# Patient Record
Sex: Male | Born: 1950 | Race: White | Hispanic: No | Marital: Married | Smoking: Never smoker
Health system: Southern US, Community
[De-identification: ages and names within clinical notes are randomized; demographics above are authoritative.]

## PROBLEM LIST (undated history)

## (undated) DIAGNOSIS — R269 Unspecified abnormalities of gait and mobility: Secondary | ICD-10-CM

## (undated) DIAGNOSIS — I519 Heart disease, unspecified: Secondary | ICD-10-CM

## (undated) DIAGNOSIS — I252 Old myocardial infarction: Secondary | ICD-10-CM

## (undated) DIAGNOSIS — I609 Nontraumatic subarachnoid hemorrhage, unspecified: Secondary | ICD-10-CM

## (undated) DIAGNOSIS — S065X9A Traumatic subdural hemorrhage with loss of consciousness of unspecified duration, initial encounter: Secondary | ICD-10-CM

## (undated) DIAGNOSIS — F068 Other specified mental disorders due to known physiological condition: Secondary | ICD-10-CM

## (undated) DIAGNOSIS — R32 Unspecified urinary incontinence: Secondary | ICD-10-CM

## (undated) DIAGNOSIS — N433 Hydrocele, unspecified: Secondary | ICD-10-CM

## (undated) DIAGNOSIS — I499 Cardiac arrhythmia, unspecified: Secondary | ICD-10-CM

## (undated) DIAGNOSIS — I480 Paroxysmal atrial fibrillation: Secondary | ICD-10-CM

## (undated) DIAGNOSIS — R413 Other amnesia: Secondary | ICD-10-CM

## (undated) DIAGNOSIS — E785 Hyperlipidemia, unspecified: Secondary | ICD-10-CM

## (undated) DIAGNOSIS — S065XAA Traumatic subdural hemorrhage with loss of consciousness status unknown, initial encounter: Secondary | ICD-10-CM

## (undated) DIAGNOSIS — G40909 Epilepsy, unspecified, not intractable, without status epilepticus: Secondary | ICD-10-CM

## (undated) DIAGNOSIS — S069X0S Unspecified intracranial injury without loss of consciousness, sequela: Secondary | ICD-10-CM

## (undated) DIAGNOSIS — E119 Type 2 diabetes mellitus without complications: Secondary | ICD-10-CM

## (undated) DIAGNOSIS — R399 Unspecified symptoms and signs involving the genitourinary system: Secondary | ICD-10-CM

## (undated) DIAGNOSIS — I10 Essential (primary) hypertension: Secondary | ICD-10-CM

## (undated) DIAGNOSIS — R4189 Other symptoms and signs involving cognitive functions and awareness: Secondary | ICD-10-CM

## (undated) DIAGNOSIS — I2510 Atherosclerotic heart disease of native coronary artery without angina pectoris: Secondary | ICD-10-CM

## (undated) DIAGNOSIS — Z955 Presence of coronary angioplasty implant and graft: Secondary | ICD-10-CM

## (undated) HISTORY — PX: TONSILLECTOMY: SUR1361

## (undated) HISTORY — DX: Heart disease, unspecified: I51.9

## (undated) HISTORY — PX: CATARACT EXTRACTION W/ INTRAOCULAR LENS  IMPLANT, BILATERAL: SHX1307

## (undated) HISTORY — PX: OTHER SURGICAL HISTORY: SHX169

## (undated) HISTORY — DX: Traumatic subdural hemorrhage with loss of consciousness of unspecified duration, initial encounter: S06.5X9A

## (undated) HISTORY — PX: CORONARY ANGIOPLASTY WITH STENT PLACEMENT: SHX49

## (undated) HISTORY — DX: Traumatic subdural hemorrhage with loss of consciousness status unknown, initial encounter: S06.5XAA

---

## 2006-12-02 ENCOUNTER — Encounter (INDEPENDENT_AMBULATORY_CARE_PROVIDER_SITE_OTHER): Payer: Self-pay | Admitting: Ophthalmology

## 2006-12-02 ENCOUNTER — Ambulatory Visit (HOSPITAL_COMMUNITY): Admission: RE | Admit: 2006-12-02 | Discharge: 2006-12-03 | Payer: Self-pay | Admitting: Ophthalmology

## 2006-12-02 IMAGING — CR DG CHEST 2V
2 series · 2 of 2 positions shown · non-contrast
Comparison: None.

CLINICAL DATA: Dislocated intraarticular lens left eye.  Preoperative chest.
 CHEST - 2 VIEW:

[view not recorded (1 of 2)]
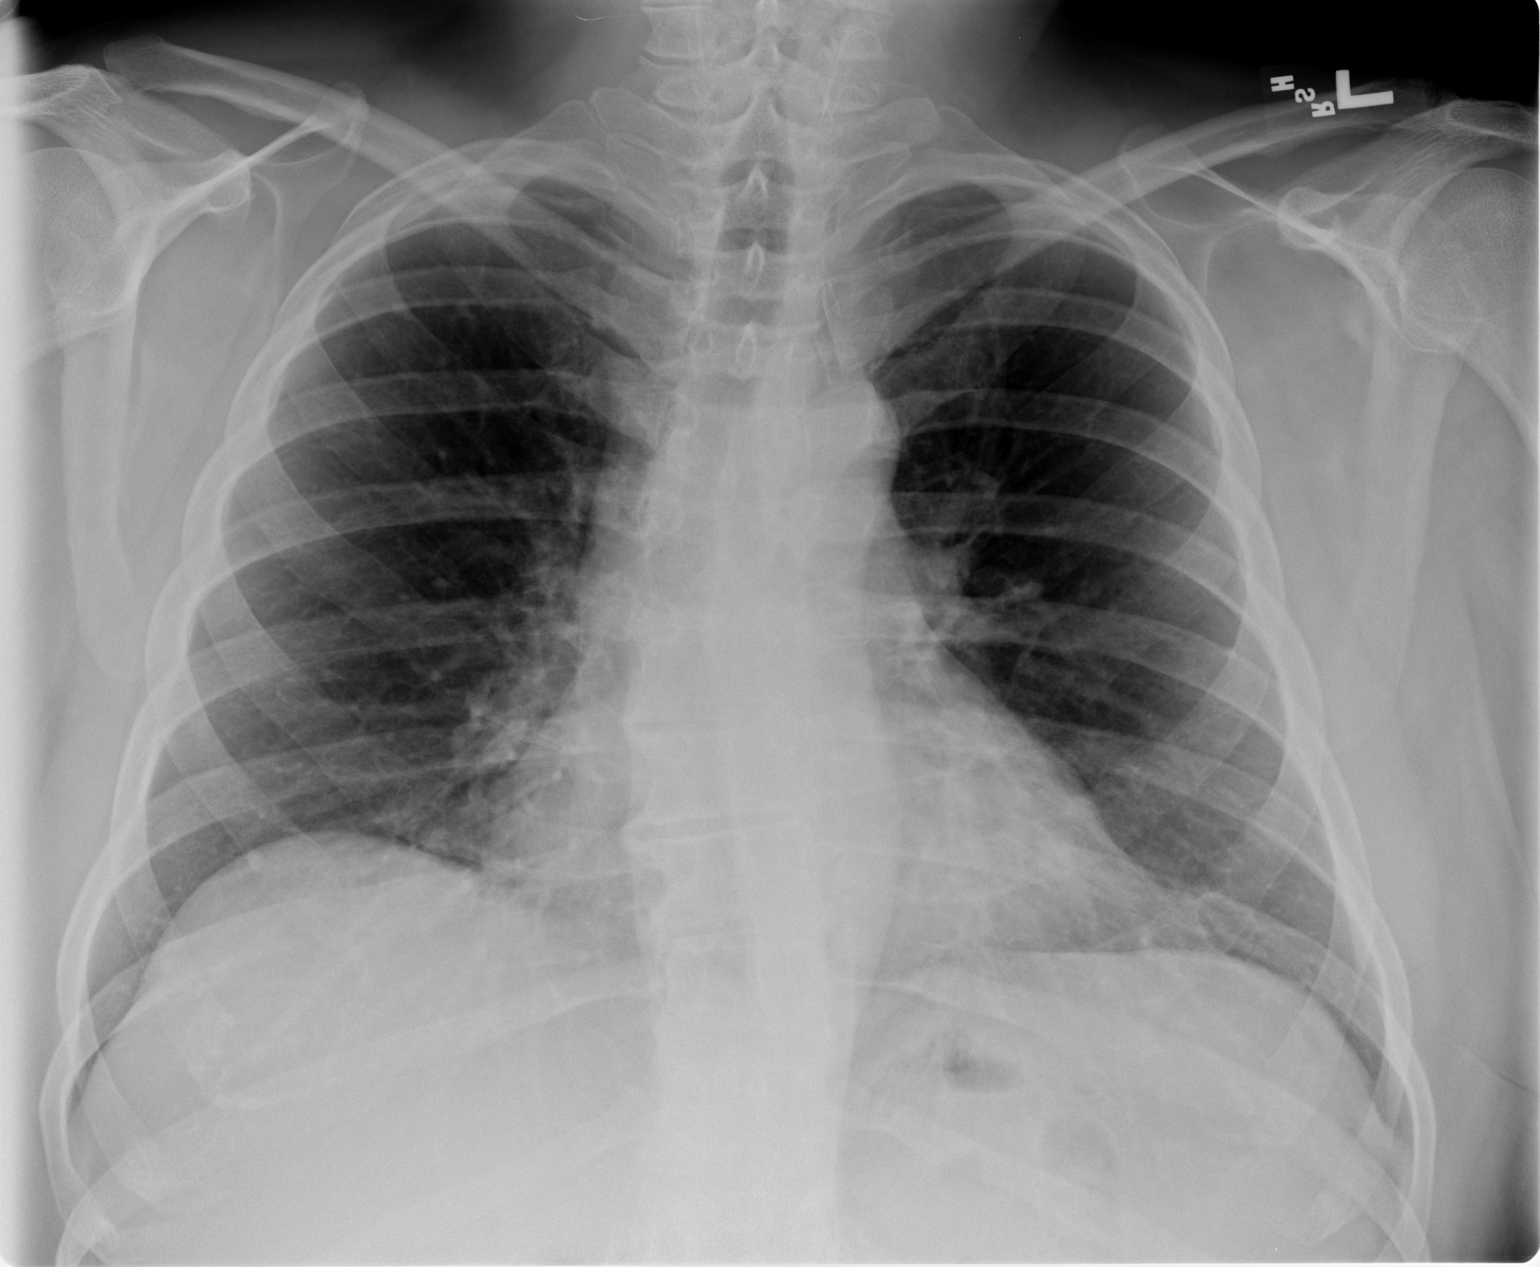

[view not recorded (2 of 2)]
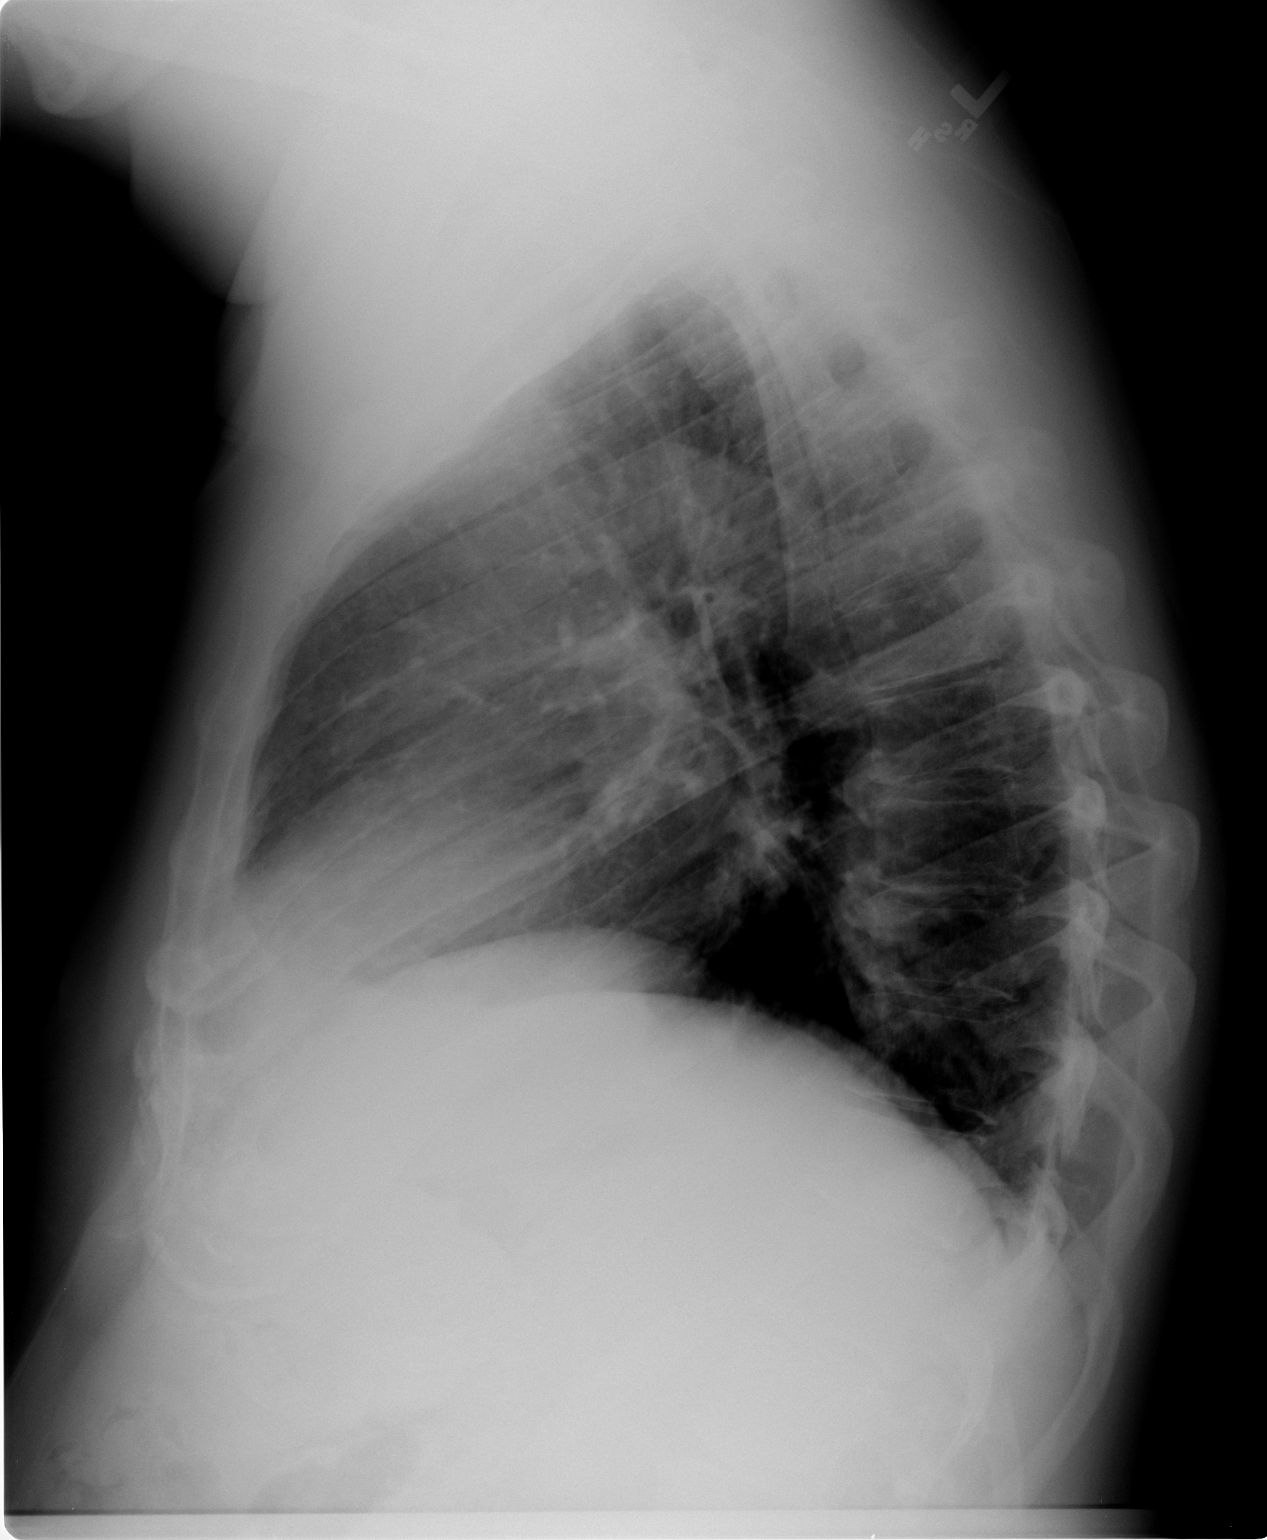

[2 of 2 positions shown; findings below may reference images not displayed]

FINDINGS: The lungs are clear.  The heart is normal in size and there are no mediastinal or hilar abnormalities. There is mild thoracic scoliosis present.
IMPRESSION: No evidence for active chest disease.

## 2008-04-03 ENCOUNTER — Emergency Department (HOSPITAL_BASED_OUTPATIENT_CLINIC_OR_DEPARTMENT_OTHER): Admission: EM | Admit: 2008-04-03 | Discharge: 2008-04-03 | Payer: Self-pay | Admitting: Emergency Medicine

## 2010-07-25 NOTE — Op Note (Signed)
[name] Frank, [name] Frank                 ACCOUNT NO.:  [id number]   MEDICAL RECORD NO.:  [id number]          PATIENT TYPE:  OIB   LOCATION:  5707                         FACILITY:  [facility]   PHYSICIAN:  Lanna Poche, M.D. DATE OF BIRTH:  29-Jan-1951   DATE OF PROCEDURE:  12/02/2006  DATE OF DISCHARGE:  12/03/2006                               OPERATIVE REPORT   PRE-AND-POSTOPERATIVE DIAGNOSIS:  Dislocated intraocular lens left eye.   PROCEDURE:  Pars plana vitrectomy, membrane peel, intraocular lens  removal, secondary sutured intraocular lens left eye.   SURGEON:  Lanna Poche, M.D.   ASSISTANT:  Bryan Lemma. Lundquist, P.A.   ANESTHESIA:  General endotracheal.   ESTIMATED BLOOD LOSS:  Less than 1 mL.   COMPLICATIONS:  None.   OPERATIVE NOTE:  The patient was taken to the operating room, where  after induction of general anesthesia the left eye was prepped and  draped in the usual fashion.  A lid speculum was introduced and  conjunctival peritomy was developed to __________ degrees.  Hemostasis  was obtained with Eraser cautery and sclerotomies were fashioned 3.75 mm  posterior to the limbus at 1:30, 10:30, and 4:30.  The superior  sclerotomies were plugged and a 4-mm infusion cannula was secured at the  4:30 sclerotomy with a temporary suture of 7-0 Vicryl.  The tip was  visually inspected and found to be in good position.  The anterior  chamber was inspected, and the rents in the anterior-and-posterior  capsule could be seen, but there was no implant visible in the visual  axis.  A partial-thickness scleral incision of cord length 7 mm was made  2 mm posterior to the limbus superiorly, the dissection carried up from  the cornea entering the anterior chamber for a full cord length of the  incision.   The suture was temporally placed over the incision to make it  watertight.  Landers ring was then secured with 7-0 Vicryl sutures at 3  and 9.  The plugs were then removed and a  30-degree prismatic lens was  applied to the surface of the eye.  Central core followed by peripheral  vitrectomy was performed.  The dislocated IOL could be seen in the  vitreous cavity overlying the inferior and posterior retina.  After  completion of peripheral vitrectomy, a flat lens was applied to the  surface.  The posterior cortical vitreous was still seen to be present.  This was engaged with a silicone-tip catheter, and then the vitrector  blade was then peeled off the posterior segment.  The peripheral  vitrectomy was then performed around the IOL allowing the IOL to slip  through the opening in posterior hyaloid and then scleral depression  used to further trim the small amount of hemorrhage in the vitreous  space inferiorly with the aid of the 30-degree prismatic lens.  The flat  lens was then substituted on the surface of the eye using the serrated  forceps.  The IOL haptic was grasped and brought up to the pupillary  plane.  The Bernette Mayers lensring  was then removed,  and the suture over the  wound superiorly was opened.   The IOL was then stabilized __________ the second __________ forceps of  the other superior sclerotomy, and then the fusion turned off, the IOL  grasped, and dialed out through the superior wound.  The holes were  plugged and inspection with the indirect ophthalmoscope with scleral  depression at 360-degrees revealed there to be no breaks or tears.  Partial-thickness trapezoidal scleral flaps were formed at 3 and 9.  A  10-0 Prolene sutures was then passed approximately 2 mm posterior to the  limbus under one flap and coming out the other, the lateral guided with  a 27-gauge needle.  The end of the Prolene suture was then externalized  out through the superior wound and tied to both sides of the IOL.  The  IOL was a 10.0 diopter, model CZ-70-BP, serial number [id number].  The  IOL was to be seen free of defects.  Infusion was turned off and the IOL  was  then copiously irrigated and passed with a lens forceps into the  sulcus area.  The superior haptic was then manipulated into the sulcus  area temporally with the aid of a Lester lens hook.   The 10-0 Prolene sutures were then tightened and the IOL was seen to  center well.  Partial-thickness bites were made in the scleral bed at 3  and 9 using the Prolene sutures, an the sutures tied to themselves.  The  superior wound was then closed with interrupted sutures of 10-0 nylon.  The partial-thickness scleral flaps in the superior sclerotomies were  closed with 7-0 Vicryl.  The infusion cannula was removed and preplaced  sutures secured.  The wound was checked for water tightness and found to  be secured.  The conjunctivae was drawn up and reapproximated with  running suture of 6-0 plain gut.  The subconjunctival space was  irrigated with 0.75% Marcaine followed by subconjunctival injection of  100 mg of ceftazidime,  10 mg of Decadron.  Pressure was checked with  the Barraquer tonometer and found to lay at 15.  The lid speculum was  then removed and mixed antibiotic ointment applied to the surface of the  eye.  A patch and shield was then placed over the patient's eye.  Upon  awakening from anesthesia, the patient left the operating room in stable  condition.           ______________________________  Lanna Poche, M.D.     JTH/MEDQ  D:  12/02/2006  T:  12/03/2006  Job:  [id number]

## 2010-12-21 LAB — URINALYSIS, ROUTINE W REFLEX MICROSCOPIC
Glucose, UA: NEGATIVE
Hgb urine dipstick: NEGATIVE
Nitrite: NEGATIVE
Specific Gravity, Urine: 1.027

## 2010-12-21 LAB — CBC
HCT: 44.3
Hemoglobin: 15.5
MCHC: 35
MCV: 86.3
Platelets: 182
RDW: 13

## 2010-12-21 LAB — COMPREHENSIVE METABOLIC PANEL
Albumin: 3.8
Alkaline Phosphatase: 44
BUN: 19
Creatinine, Ser: 0.84
Glucose, Bld: 124 — ABNORMAL HIGH
Potassium: 5.2 — ABNORMAL HIGH
Total Protein: 6

## 2011-01-13 ENCOUNTER — Other Ambulatory Visit: Payer: Self-pay | Admitting: Emergency Medicine

## 2011-01-13 ENCOUNTER — Encounter: Payer: Self-pay | Admitting: Emergency Medicine

## 2011-01-13 ENCOUNTER — Inpatient Hospital Stay (INDEPENDENT_AMBULATORY_CARE_PROVIDER_SITE_OTHER)
Admission: RE | Admit: 2011-01-13 | Discharge: 2011-01-13 | Disposition: A | Discharge status: Home / Self Care | Payer: Private Health Insurance - Indemnity | Source: Ambulatory Visit | Attending: Emergency Medicine | Admitting: Emergency Medicine

## 2011-01-13 DIAGNOSIS — E119 Type 2 diabetes mellitus without complications: Secondary | ICD-10-CM | POA: Insufficient documentation

## 2011-01-13 DIAGNOSIS — J069 Acute upper respiratory infection, unspecified: Secondary | ICD-10-CM

## 2011-01-13 LAB — CONVERTED CEMR LAB: Rapid Strep: NEGATIVE

## 2011-02-10 ENCOUNTER — Emergency Department (INDEPENDENT_AMBULATORY_CARE_PROVIDER_SITE_OTHER)
Admission: EM | Admit: 2011-02-10 | Discharge: 2011-02-10 | Disposition: A | Discharge status: Home / Self Care | Payer: Private Health Insurance - Indemnity | Source: Home / Self Care | Attending: Emergency Medicine | Admitting: Emergency Medicine

## 2011-02-10 ENCOUNTER — Encounter: Payer: Self-pay | Admitting: Emergency Medicine

## 2011-02-10 DIAGNOSIS — R05 Cough: Secondary | ICD-10-CM

## 2011-02-10 DIAGNOSIS — J069 Acute upper respiratory infection, unspecified: Secondary | ICD-10-CM

## 2011-02-10 HISTORY — DX: Hyperlipidemia, unspecified: E78.5

## 2011-02-10 HISTORY — DX: Essential (primary) hypertension: I10

## 2011-02-10 MED ORDER — AMOXICILLIN-POT CLAVULANATE 875-125 MG PO TABS: 1.0000 | ORAL_TABLET | Freq: Two times a day (BID) | ORAL | Status: AC

## 2011-02-10 MED ORDER — GUAIFENESIN-CODEINE 100-10 MG/5ML PO SYRP: 5.0000 mL | ORAL_SOLUTION | Freq: Four times a day (QID) | ORAL | Status: AC | PRN

## 2011-02-10 MED ORDER — POLYMYXIN B-TRIMETHOPRIM 10000-0.1 UNIT/ML-% OP SOLN: 1.0000 [drp] | Freq: Four times a day (QID) | OPHTHALMIC | Status: AC

## 2011-02-10 NOTE — ED Provider Notes (Signed)
History     CSN: [id number] Arrival date & time: No admission date for patient encounter.   First MD Initiated Contact with Patient 02/10/11 1449      No chief complaint on file.   (Consider location/radiation/quality/duration/timing/severity/associated sxs/prior treatment) HPI Rawn is a 60 y.o. male who complains of onset of cold symptoms for a few days.  He was here last month and got amoxicillin for similar symptoms that medicine made him better and he remained fine for a couple weeks and then has gotten worse over the last couple days. He has also noticed some eye drainage of his left eye this morning and was concerned about pinkeye. No sore throat No cough No pleuritic pain No wheezing + nasal congestion + post-nasal drainage + sinus pain/pressure No chest congestion No itchy/red eyes No earache No hemoptysis No SOB No chills/sweats No fever No nausea No vomiting No abdominal pain No diarrhea No skin rashes + fatigue No myalgias No headache    No past medical history on file.  No past surgical history on file.  No family history on file.  History  Substance Use Topics  . Smoking status: Not on file  . Smokeless tobacco: Not on file  . Alcohol Use: Not on file      Review of Systems  Allergies  Review of patient's allergies indicates not on file.  Home Medications   Current Outpatient Rx  Name Route Sig Dispense Refill  . AMOXICILLIN-POT CLAVULANATE 875-125 MG PO TABS Oral Take 1 tablet by mouth 2 (two) times daily. 24 tablet 0  . GUAIFENESIN-CODEINE 100-10 MG/5ML PO SYRP Oral Take 5 mLs by mouth 4 (four) times daily as needed for cough or congestion. 120 mL 0  . POLYMYXIN B-TRIMETHOPRIM 10000-0.1 UNIT/ML-% OP SOLN Ophthalmic Apply 1 drop to eye every 6 (six) hours. 10 mL 0    There were no vitals taken for this visit.  Physical Exam  Nursing note and vitals reviewed. Constitutional: He is oriented to person, place, and time. He appears  well-developed and well-nourished.  HENT:  Head: Normocephalic and atraumatic.  Right Ear: Tympanic membrane, external ear and ear canal normal.  Left Ear: Tympanic membrane, external ear and ear canal normal.  Nose: Mucosal edema and rhinorrhea present.  Mouth/Throat: Posterior oropharyngeal erythema present. No oropharyngeal exudate or posterior oropharyngeal edema.  Eyes: Conjunctivae are normal. Right eye exhibits no discharge and no exudate. Left eye exhibits no discharge and no exudate. Right conjunctiva is not injected. Left conjunctiva is not injected.  Neck: Neck supple.  Cardiovascular: Regular rhythm and normal heart sounds.   Pulmonary/Chest: Effort normal and breath sounds normal. No respiratory distress.  Neurological: He is alert and oriented to person, place, and time.  Skin: Skin is warm and dry.  Psychiatric: He has a normal mood and affect. His speech is normal.    ED Course  Procedures (including critical care time)  Labs Reviewed - No data to display No results found.   1. Acute upper respiratory infections of unspecified site   2. Cough       MDM  1)  Take the prescribed antibiotic as instructed. 2)  Use nasal saline solution (over the counter) at least 3 times a day. 3)  Use over the counter decongestants like Zyrtec-D every 12 hours as needed to help with congestion.  If you have hypertension, do not take medicines with sudafed.  4)  Can take tylenol every 6 hours or motrin every 8 hours for pain  or fever. 5)  Follow up with your primary doctor if no improvement in 5-7 days, sooner if increasing pain, fever, or new symptoms.     Lily Kocher, MD 02/10/11 1452

## 2011-02-12 NOTE — Progress Notes (Signed)
Summary: cold/TM   Vital Signs:  Patient Profile:   60 Years Old Male CC:      cold/flu Height:     69 inches Weight:      273 pounds O2 Sat:      94 % O2 treatment:    Room Air Temp:     98.7 degrees F oral Pulse rate:   79 / minute Resp:     24 per minute BP sitting:   121 / 78  (left arm) Cuff size:   large  Vitals Entered By: Linton Flemings RN (January 13, 2011 11:32 AM)                  Updated Prior Medication List: * METFORMIN  * ZOCOR (SIMVASTATIN)  actos Current Allergies: No known allergies History of Present Illness Chief Complaint: cold/flu History of Present Illness: 60 Years Old Male complains of onset of cold symptoms for 3 days.  Jaidon has been using OTC cold meds which is helping a little bit.  He was just at a convention and on a plane, so many possible sick contacts. He is diabetic but controlled. + sore throat + cough No pleuritic pain No wheezing + nasal congestion +post-nasal drainage + sinus pain/pressure + chest congestion No itchy/red eyes No earache No hemoptysis No SOB +chills/sweats No fever No nausea No vomiting No abdominal pain No diarrhea No skin rashes No fatigue No myalgias No headache   REVIEW OF SYSTEMS Constitutional Symptoms      Denies fever, chills, night sweats, weight loss, weight gain, and fatigue.  Eyes       Denies change in vision, eye pain, eye discharge, glasses, contact lenses, and eye surgery. Ear/Nose/Throat/Mouth       Complains of frequent runny nose, sore throat, and hoarseness.      Denies hearing loss/aids, change in hearing, ear pain, ear discharge, dizziness, frequent nose bleeds, sinus problems, and tooth pain or bleeding.  Respiratory       Complains of dry cough.      Denies wheezing, shortness of breath, asthma, bronchitis, and emphysema/COPD.  Cardiovascular       Denies murmurs, chest pain, and tires easily with exhertion.    Gastrointestinal       Denies stomach pain,  nausea/vomiting, diarrhea, constipation, blood in bowel movements, and indigestion. Genitourniary       Denies painful urination, kidney stones, and loss of urinary control. Neurological       Complains of headaches.      Denies paralysis, seizures, and fainting/blackouts. Musculoskeletal       Complains of muscle pain.      Denies joint pain, joint stiffness, decreased range of motion, redness, swelling, muscle weakness, and gout.  Skin       Denies bruising, unusual mles/lumps or sores, and hair/skin or nail changes.  Psych       Denies mood changes, temper/anger issues, anxiety/stress, speech problems, depression, and sleep problems. Other Comments: symptoms started Thursday   Past History:  Past Medical History: Diabetes mellitus, type II  Social History: smoke- no alcohol- no drug use- no Physical Exam General appearance: well developed, well nourished, mild distress Ears: mild erythema bilateral Nasal: clear discharge Oral/Pharynx: pharyngeal erythema without exudate, uvula midline without deviation Chest/Lungs: no rales, wheezes, or rhonchi bilateral, breath sounds equal without effort Heart: regular rate and  rhythm, no murmur MSE: oriented to time, place, and person Assessment New Problems: UPPER RESPIRATORY INFECTION, ACUTE (ICD-465.9) DIABETES MELLITUS, TYPE  II (ICD-250.00) UPPER RESPIRATORY INFECTION, ACUTE (ICD-465.9)   Plan New Medications/Changes: AMOXICILLIN 875 MG TABS (AMOXICILLIN) 1 by mouth two times a day for 7 days  #14 x 0, 01/13/2011, Hoyt Koch MD  New Orders: New Patient Level III [phone] T-Culture, Throat [[id number]] Rapid Strep [91478] Planning Comments:   1)  Take the prescribed antibiotic as instructed.  Hold for a few days.  Rapid strep neg.  Culture pending.  Check his BS when he gets home and keep it stable. 2)  Use nasal saline solution (over the counter) at least 3 times a day. 3)  Use over the counter decongestants like  Zyrtec-D every 12 hours as needed to help with congestion. 4)  Can take tylenol every 6 hours or motrin every 8 hours for pain or fever. 5)  Follow up with your primary doctor  if no improvement in 5-7 days, sooner if increasing pain, fever, or new symptoms.    The patient and/or caregiver has been counseled thoroughly with regard to medications prescribed including dosage, schedule, interactions, rationale for use, and possible side effects and they verbalize understanding.  Diagnoses and expected course of recovery discussed and will return if not improved as expected or if the condition worsens. Patient and/or caregiver verbalized understanding.  Prescriptions: AMOXICILLIN 875 MG TABS (AMOXICILLIN) 1 by mouth two times a day for 7 days  #14 x 0   Entered and Authorized by:   Hoyt Koch MD   Signed by:   Hoyt Koch MD on 01/13/2011   Method used:   Print then Give to Patient   RxID:   [id number]   Orders Added: 1)  New Patient Level III [62952] 2)  T-Culture, Throat [[id number]] 3)  Rapid Strep [27253]    Laboratory Results  Date/Time Received: January 13, 2011 12:08 PM  Date/Time Reported: January 13, 2011 12:08 PM   Other Tests  Rapid Strep: negative  Kit Test Internal QC: Negative   (Normal Range: Negative)

## 2012-03-12 ENCOUNTER — Emergency Department
Admission: EM | Admit: 2012-03-12 | Discharge: 2012-03-12 | Disposition: A | Discharge status: Home / Self Care | Payer: Private Health Insurance - Indemnity | Source: Home / Self Care | Attending: Family Medicine | Admitting: Family Medicine

## 2012-03-12 ENCOUNTER — Encounter: Payer: Self-pay | Admitting: *Deleted

## 2012-03-12 DIAGNOSIS — J069 Acute upper respiratory infection, unspecified: Secondary | ICD-10-CM

## 2012-03-12 MED ORDER — AMOXICILLIN 875 MG PO TABS: 875.0000 mg | ORAL_TABLET | Freq: Two times a day (BID) | ORAL | Status: DC

## 2012-03-12 MED ORDER — BENZONATATE 200 MG PO CAPS: 200.0000 mg | ORAL_CAPSULE | Freq: Every day | ORAL | Status: DC

## 2012-03-12 NOTE — ED Provider Notes (Signed)
History     CSN: [id number]  Arrival date & time 03/12/12  1436   First MD Initiated Contact with Patient 03/12/12 1507      Chief Complaint  Patient presents with  . Sore Throat  . Cough      HPI Comments: Patient complains of two day history of a productive cough, sore throat, and post-nasal drainage.  He denies fevers, chills, and sweats and myalgias, and does not feel ill.  He has a history of sinusitis and recurrent otitis media during viral URI's.  The history is provided by the patient.    Past Medical History  Diagnosis Date  . Diabetes mellitus   . Hypertension   . Hyperlipemia     Past Surgical History  Procedure Date  . Tonsillectomy   . Catarracts     Family History  Problem Relation Age of Onset  . Stroke Father   . Hypertension Father     History  Substance Use Topics  . Smoking status: Never Smoker   . Smokeless tobacco: Not on file  . Alcohol Use: No      Review of Systems No sore throat + cough No pleuritic pain No wheezing + nasal congestion + post-nasal drainage ? sinus pain/pressure No itchy/red eyes ? earache No hemoptysis No SOB No fever/chills No nausea No vomiting No abdominal pain No diarrhea No urinary symptoms No skin rashes + fatigue No myalgias No headache Used OTC meds without relief  Allergies  Review of patient's allergies indicates no known allergies.  Home Medications   Current Outpatient Rx  Name  Route  Sig  Dispense  Refill  . GLIPIZIDE ER 10 MG PO TB24   Oral   Take 10 mg by mouth daily.         . AMOXICILLIN 875 MG PO TABS   Oral   Take 1 tablet (875 mg total) by mouth 2 (two) times daily.   20 tablet   0   . ASPIRIN 81 MG PO CHEW   Oral   Chew 81 mg by mouth daily.           Marland Kitchen BENZONATATE 200 MG PO CAPS   Oral   Take 1 capsule (200 mg total) by mouth at bedtime. Take as needed for cough   12 capsule   0   . LOSARTAN POTASSIUM 100 MG PO TABS   Oral   Take 100 mg by mouth  daily.           Marland Kitchen METFORMIN HCL 1000 MG PO TABS   Oral   Take 1,000 mg by mouth 2 (two) times daily with a meal.           . PIOGLITAZONE HCL 15 MG PO TABS   Oral   Take 45 mg by mouth daily.          Marland Kitchen SIMVASTATIN 40 MG PO TABS   Oral   Take 40 mg by mouth at bedtime.             BP 144/87  Pulse 86  Temp 98.8 F (37.1 C) (Oral)  Resp 18  Ht 5\' 9"  (1.753 m)  Wt 270 lb (122.471 kg)  BMI 39.87 kg/m2  SpO2 96%  Physical Exam Nursing notes and Vital Signs reviewed. Appearance:  Patient appears stated age, and in no acute distress.  Patient is obese (BMI 39.9) Eyes:  Pupils are equal, round, and reactive to light and accomodation.  Extraocular movement is intact.  Conjunctivae are not inflamed  Ears:  Canals normal.  Tympanic membranes normal.  Nose:  Mildly congested turbinates.  No sinus tenderness.   Pharynx:  Normal Neck:  Supple.  Slightly tender shotty posterior nodes are palpated bilaterally  Lungs:  Clear to auscultation.  Breath sounds are equal.  Heart:  Regular rate and rhythm without murmurs, rubs, or gallops.  Abdomen:  Nontender without masses or hepatosplenomegaly.  Bowel sounds are present.  No CVA or flank tenderness.  Extremities:  No edema.  No calf tenderness Skin:  No rash present.   ED Course  Procedures none   Labs Reviewed  POCT RAPID STREP A (OFFICE) negative      1. Acute upper respiratory infections of unspecified site; suspect viral URI      MDM  With a history of frequent sinusitis and otitis media, will begin amoxicillin Prescription written for Benzonatate (Tessalon) to take at bedtime for night-time cough.  Take plain Mucinex (guaifenesin) twice daily for cough and congestion.  Increase fluid intake, rest. May use Afrin nasal spray (or generic oxymetazoline) twice daily for about 5 days.  Also recommend using saline nasal spray several times daily and saline nasal irrigation (AYR is a common brand) Stop all antihistamines  for now, and other non-prescription cough/cold preparations. Follow-up with family doctor if not improving 7 to 10 days.        Lattie Haw, MD 03/16/12 1122

## 2012-03-12 NOTE — ED Notes (Signed)
Pt c/o productive cough, sore throat, and post nasal drip x 2 days. Denies fever. He has taken Zicam and OTC cough med.

## 2013-01-28 ENCOUNTER — Emergency Department
Admission: EM | Admit: 2013-01-28 | Discharge: 2013-01-28 | Disposition: A | Discharge status: Home / Self Care | Payer: Private Health Insurance - Indemnity | Source: Home / Self Care

## 2013-01-28 ENCOUNTER — Encounter: Payer: Self-pay | Admitting: Emergency Medicine

## 2013-01-28 DIAGNOSIS — J069 Acute upper respiratory infection, unspecified: Secondary | ICD-10-CM

## 2013-01-28 LAB — POCT RAPID STREP A (OFFICE): Rapid Strep A Screen: NEGATIVE

## 2013-01-28 MED ORDER — BENZONATATE 200 MG PO CAPS: 200.0000 mg | ORAL_CAPSULE | Freq: Every day | ORAL | Status: DC

## 2013-01-28 MED ORDER — AMOXICILLIN 875 MG PO TABS: 875.0000 mg | ORAL_TABLET | Freq: Two times a day (BID) | ORAL | Status: DC

## 2013-01-28 NOTE — ED Notes (Signed)
Pt c/o nasal congestion and productive cough x 1 wk. Denies fever. He has taken cold ez and theraflu.

## 2013-01-28 NOTE — ED Provider Notes (Signed)
CSN: [id number]     Arrival date & time 01/28/13  1114 History   None    Chief Complaint  Patient presents with  . Nasal Congestion  . Cough      HPI Comments: Patient developed fatigue, a migraine headache and sore throat one week ago (now resolved).  This was followed by sinus congestion and a cough five days ago.  The cough is productive and worse at night.  The history is provided by the patient.    Past Medical History  Diagnosis Date  . Diabetes mellitus   . Hypertension   . Hyperlipemia    Past Surgical History  Procedure Laterality Date  . Tonsillectomy    . Catarracts     Family History  Problem Relation Age of Onset  . Stroke Father   . Hypertension Father    History  Substance Use Topics  . Smoking status: Never Smoker   . Smokeless tobacco: Not on file  . Alcohol Use: No    Review of Systems + sore throat, resolved + cough No pleuritic pain No wheezing + nasal congestion + post-nasal drainage No sinus pain/pressure No itchy/red eyes ? earache No hemoptysis No SOB No fever/chills No nausea No vomiting No abdominal pain No diarrhea No urinary symptoms No skin rash + fatigue No myalgias No headache Used OTC meds without relief  Allergies  Review of patient's allergies indicates no known allergies.  Home Medications   Current Outpatient Rx  Name  Route  Sig  Dispense  Refill  . amoxicillin (AMOXIL) 875 MG tablet   Oral   Take 1 tablet (875 mg total) by mouth 2 (two) times daily. (Rx void after 02/05/13)   20 tablet   0   . aspirin 81 MG chewable tablet   Oral   Chew 81 mg by mouth daily.           . benzonatate (TESSALON) 200 MG capsule   Oral   Take 1 capsule (200 mg total) by mouth at bedtime. Take as needed for cough   12 capsule   0   . glipiZIDE (GLUCOTROL XL) 10 MG 24 hr tablet   Oral   Take 10 mg by mouth daily.         Marland Kitchen losartan (COZAAR) 100 MG tablet   Oral   Take 100 mg by mouth daily.           .  metFORMIN (GLUCOPHAGE) 1000 MG tablet   Oral   Take 1,000 mg by mouth 2 (two) times daily with a meal.           . pioglitazone (ACTOS) 15 MG tablet   Oral   Take 45 mg by mouth daily.          . simvastatin (ZOCOR) 40 MG tablet   Oral   Take 40 mg by mouth at bedtime.            BP 127/77  Pulse 87  Temp(Src) 97.8 F (36.6 C) (Oral)  Resp 18  Ht 5\' 9"  (1.753 m)  Wt 277 lb (125.646 kg)  BMI 40.89 kg/m2  SpO2 96% Physical Exam Nursing notes and Vital Signs reviewed. Appearance:  Patient appears stated age, and in no acute distress.  Patient is obese (BMI 40.9) Eyes:  Pupils are equal, round, and reactive to light and accomodation.  Extraocular movement is intact.  Conjunctivae are not inflamed  Ears:  Canals normal.  Tympanic membranes normal.  Nose:  Mildly  congested turbinates.  No sinus tenderness.    Pharynx:  Normal Neck:  Supple.  Slightly tender shotty posterior nodes are palpated bilaterally  Lungs:  Clear to auscultation.  Breath sounds are equal.  Heart:  Regular rate and rhythm without murmurs, rubs, or gallops.  Abdomen:  Nontender without masses or hepatosplenomegaly.  Bowel sounds are present.  No CVA or flank tenderness.  Extremities:  No edema.  No calf tenderness Skin:  No rash present.   ED Course  Procedures  none    Labs Reviewed  POCT RAPID STREP A (OFFICE) negative         MDM   1. Acute upper respiratory infections of unspecified site; suspect viral URI    There is no evidence of bacterial infection today.  Treat symptomatically for now:  Prescription written for Benzonatate [facility]) to take at bedtime for night-time cough.  Take Mucinex D (guaifenesin with decongestant) twice daily for congestion.  Increase fluid intake, rest. May use Afrin nasal spray (or generic oxymetazoline) twice daily for about 5 days.  Also recommend using saline nasal spray several times daily and saline nasal irrigation (AYR is a common brand) Stop all  antihistamines for now, and other non-prescription cough/cold preparations. May take Ibuprofen 200mg , 4 tabs every 8 hours with food for headache, sore throat, fever, etc. Begin Amoxicillin if not improving about one week or if persistent fever develops (Given a prescription to hold, with an expiration date)  Follow-up with family doctor if not improving about10 days.     Lattie Haw, MD 02/01/13 [phone]

## 2014-03-12 HISTORY — PX: OTHER SURGICAL HISTORY: SHX169

## 2018-01-14 NOTE — Progress Notes (Addendum)
[facility] [address], [address] [place], [place] [address] [phone]  Date:  01/16/2018   Name:  [name] Frank   DOB:  08/17/1950   MRN:  [id number]  PCP:  Pearline Cables, MD    Chief Complaint: New Patient (Initial Visit) (flu shot? Had colonoscopy approx. ten years ago)   History of Present Illness:  [name] Frank is a 67 y.o. 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 Allena Katz with endocrinology at [facility] for her DM Cardiologist is Dr. Dot Been also at [facility]- per his last note: 1. Coronary artery disease involving native coronary artery of native heart without angina pectoris  2. Old MI (myocardial infarction)  3. Mixed hyperlipidemia  4. Essential hypertension  5. H/O heart artery stent DES to LAD & POBA D1 04/06/2014  6. Type 2 diabetes mellitus without complication, without long-term current use of insulin (CMS-HCC)  Doing well from a cardiac standpoint CAD , stable Dyslipidemia, on therapy, needs to be checked HTN, on therapy DM, followed by PCP  Needs refills of all his medications today Asa lipitor plavix glipzide invokamet Losartan Lopressor - he was actually taking toprol xl 25 and splitting in half BID? Changed to toprol xl 25 once a day   We think he is due for a colonoscopy will refer back to GI with [facility].  He is not sure when his last scope was done but got a notice that he was due He needs referrals to his specialists due to insurance requirements   He is an optho pt at [facility]  ophthalmology  He also notes thick, discolored toenails and the right great toenail can be painful,  Will refer to podiatry Flu shot today  prevnar today as well   Patient Active Problem List   Diagnosis Date Noted  . Hypertension, essential 01/16/2018  . Controlled type 2 diabetes mellitus without complication, without long-term current use of insulin (HCC) 01/16/2018  . Coronary artery disease involving native heart without angina pectoris 01/16/2018  . DIABETES MELLITUS, TYPE II 01/13/2011    Past Medical History:  Diagnosis Date  . Diabetes mellitus   . Heart disease   . Hyperlipemia   . Hypertension     Past Surgical History:  Procedure Laterality Date  . catarracts    . OTHER SURGICAL HISTORY  2016   HEART STENT  . TONSILLECTOMY      Social History   Tobacco Use  . Smoking status: Never Smoker  . Smokeless tobacco: Never Used  Substance Use Topics  . Alcohol use: Yes    Comment: VERY RARELY  . Drug use: No    Family History  Problem Relation Age of Onset  . Stroke Father   . Hypertension Father   . Hyperlipidemia Father   . Early death Mother   . Colitis Sister   . Appendicitis Brother     No Known Allergies  Medication list has been reviewed and updated.  Current Outpatient  Medications on File Prior to Visit  Medication Sig Dispense Refill  . aspirin 81 MG chewable tablet Chew 81 mg by mouth daily.      . metoprolol tartrate (LOPRESSOR) 25 MG tablet Take 12.5 mg by mouth daily.     No current facility-administered medications on file prior to visit.     Review of Systems:  As per HPI- otherwise negative. No fever or chills He does not get a lot of exercise but no CP with usual activities No SOB or decreased ability to exercise    Physical Examination: Vitals:   01/16/18 1008  BP: 128/80  Pulse: 69  Resp: 16  Temp: 97.8 F (36.6 C)  SpO2: 97%   Vitals:   01/16/18 1008  Weight: 231 lb (104.8 kg)  Height: 5\' 9"  (1.753 m)   Body  mass index is 34.11 kg/m. Ideal Body Weight: Weight in (lb) to have BMI = 25: 168.9  GEN: WDWN, NAD, Non-toxic, A & O x 3, obese, looks well  HEENT: Atraumatic, Normocephalic. Neck supple. No masses, No LAD. Bilateral TM wnl, oropharynx normal.  PEERL,EOMI.   Ears and Nose: No external deformity. CV: RRR, No M/G/R. No JVD. No thrill. No extra heart sounds. PULM: CTA B, no wheezes, crackles, rhonchi. No retractions. No resp. distress. No accessory muscle use. EXTR: No c/c/e NEURO Normal gait.  PSYCH: Normally interactive. Conversant. Not depressed or anxious appearing.  Calm demeanor.  Foot exam normal except for thickened fungal toenails on all toes. The great toenail on the right is tender to pressure but does not appear to be acutely ingrown or infected    Assessment and Plan: Hypertension, essential - Plan: losartan (COZAAR) 50 MG tablet  Controlled type 2 diabetes mellitus without complication, without long-term current use of insulin (HCC) - Plan: Ambulatory referral to Ophthalmology, Ambulatory referral to Endocrinology, glipiZIDE (GLUCOTROL XL) 10 MG 24 hr tablet, metoprolol succinate (TOPROL-XL) 25 MG 24 hr tablet, INVOKAMET [phone] MG TABS, DISCONTINUED: INVOKAMET [phone] MG TABS  Coronary artery disease involving native heart without angina pectoris, unspecified vessel or lesion type - Plan: clopidogrel (PLAVIX) 75 MG tablet, atorvastatin (LIPITOR) 80 MG tablet, Ambulatory referral to Cardiology  Screening for colon cancer - Plan: Ambulatory referral to Gastroenterology  Screening for prostate cancer - Plan: PSA  Encounter for hepatitis C screening test for low risk patient - Plan: Hepatitis C antibody  Immunization due - Plan: Pneumococcal conjugate vaccine 13-valent IM  Onychomycosis - Plan: Ambulatory referral to Podiatry  Need for influenza vaccination - Plan: Flu vaccine HIGH DOSE PF (Fluzone High dose)  >45 minutes spent in face to face time with patient, >50%  spent in counselling or coordination of care Referrals to all his specialists and refills of all his meds Went over health maint He does not want to check A1c today as he has been out of his invoknamet Offered to take over his DM care from endocrinology if he likes- in this case please see me in 3 months for A1c testing   Signed Abbe Amsterdam, MD  Received his labs 11/9 Results for orders placed or performed in visit on 01/16/18  Hepatitis C antibody  Result Value Ref Range   Hepatitis C Ab NON-REACTIVE NON-REACTI   SIGNAL TO CUT-OFF 0.03 <1.00  PSA  Result Value Ref Range   PSA 0.30 0.10 - 4.00 ng/mL   Letter to pt

## 2018-01-16 ENCOUNTER — Ambulatory Visit (INDEPENDENT_AMBULATORY_CARE_PROVIDER_SITE_OTHER): Payer: Medicare HMO | Admitting: Family Medicine

## 2018-01-16 ENCOUNTER — Encounter: Payer: Self-pay | Admitting: Family Medicine

## 2018-01-16 VITALS — BP 128/80 | HR 69 | Temp 97.8°F | Resp 16 | Ht 69.0 in | Wt 231.0 lb

## 2018-01-16 DIAGNOSIS — I10 Essential (primary) hypertension: Secondary | ICD-10-CM | POA: Insufficient documentation

## 2018-01-16 DIAGNOSIS — E119 Type 2 diabetes mellitus without complications: Secondary | ICD-10-CM | POA: Insufficient documentation

## 2018-01-16 DIAGNOSIS — Z23 Encounter for immunization: Secondary | ICD-10-CM

## 2018-01-16 DIAGNOSIS — Z1211 Encounter for screening for malignant neoplasm of colon: Secondary | ICD-10-CM

## 2018-01-16 DIAGNOSIS — Z125 Encounter for screening for malignant neoplasm of prostate: Secondary | ICD-10-CM

## 2018-01-16 DIAGNOSIS — Z1159 Encounter for screening for other viral diseases: Secondary | ICD-10-CM

## 2018-01-16 DIAGNOSIS — B351 Tinea unguium: Secondary | ICD-10-CM

## 2018-01-16 DIAGNOSIS — I2510 Atherosclerotic heart disease of native coronary artery without angina pectoris: Secondary | ICD-10-CM | POA: Diagnosis not present

## 2018-01-16 LAB — PSA: PSA: 0.3 ng/mL (ref 0.10–4.00)

## 2018-01-16 MED ORDER — GLIPIZIDE ER 10 MG PO TB24: 10.0000 mg | ORAL_TABLET | Freq: Two times a day (BID) | ORAL | 3 refills | Status: DC

## 2018-01-16 MED ORDER — INVOKAMET 150-1000 MG PO TABS: 1.0000 | ORAL_TABLET | Freq: Two times a day (BID) | ORAL | 3 refills | Status: DC

## 2018-01-16 MED ORDER — CLOPIDOGREL BISULFATE 75 MG PO TABS: 75.0000 mg | ORAL_TABLET | Freq: Every day | ORAL | 3 refills | Status: DC

## 2018-01-16 MED ORDER — LOSARTAN POTASSIUM 50 MG PO TABS: 50.0000 mg | ORAL_TABLET | Freq: Every day | ORAL | 3 refills | Status: DC

## 2018-01-16 MED ORDER — METOPROLOL SUCCINATE ER 25 MG PO TB24: 25.0000 mg | ORAL_TABLET | Freq: Every day | ORAL | 3 refills | Status: DC

## 2018-01-16 MED ORDER — ATORVASTATIN CALCIUM 80 MG PO TABS: 80.0000 mg | ORAL_TABLET | Freq: Every day | ORAL | 3 refills | Status: DC

## 2018-01-16 NOTE — Patient Instructions (Addendum)
Good to meet you today I placed referrals for you to your various specialists as below  Refilled medications Flu and pneumonia shot today Let's visit in 6 months I'll be in touch with your labs from today asap

## 2018-01-17 LAB — HEPATITIS C ANTIBODY
Hepatitis C Ab: NONREACTIVE
SIGNAL TO CUT-OFF: 0.03 (ref <1.00)

## 2018-01-27 ENCOUNTER — Ambulatory Visit: Payer: Medicare HMO | Admitting: Podiatry

## 2018-01-27 ENCOUNTER — Encounter: Payer: Self-pay | Admitting: Podiatry

## 2018-01-27 VITALS — BP 119/66 | HR 63 | Resp 16

## 2018-01-27 DIAGNOSIS — L600 Ingrowing nail: Secondary | ICD-10-CM

## 2018-01-27 DIAGNOSIS — B351 Tinea unguium: Secondary | ICD-10-CM | POA: Diagnosis not present

## 2018-01-27 DIAGNOSIS — M79674 Pain in right toe(s): Secondary | ICD-10-CM | POA: Diagnosis not present

## 2018-01-27 DIAGNOSIS — M79675 Pain in left toe(s): Secondary | ICD-10-CM | POA: Diagnosis not present

## 2018-01-27 MED ORDER — NEOMYCIN-POLYMYXIN-HC 3.5-10000-1 OT SOLN: OTIC | 1 refills | Status: DC

## 2018-01-27 NOTE — Progress Notes (Signed)
   Subjective:    Patient ID: [name] Frank, male    DOB: 1950/04/16, 67 y.o.   MRN: [id number]  HPI    Review of Systems  All other systems reviewed and are negative.      Objective:   Physical Exam        Assessment & Plan:

## 2018-01-27 NOTE — Patient Instructions (Signed)

## 2018-01-28 NOTE — Progress Notes (Signed)
Subjective:   Patient ID: [name] Frank, male   DOB: 67 y.o.   MRN: [id number]   HPI Patient presents stating he has trouble with all of his nails and specifically the big toenail on his right is ingrown and painful and he is tried to cut it out himself and soak it without relief and he needs to have it fixed.  Patient is a type II diabetic under good control in general he states his A1c is below 7 and patient does not smoke and likes to be active   Review of Systems  All other systems reviewed and are negative.       Objective:  Physical Exam  Constitutional: He appears well-developed and well-nourished.  Cardiovascular: Intact distal pulses.  Pulmonary/Chest: Effort normal.  Musculoskeletal: Normal range of motion.  Neurological: He is alert.  Skin: Skin is warm.  Nursing note and vitals reviewed.   Neurovascular status found to be intact muscle strength was found to be adequate range of motion was within normal limits.  Patient was noted to have incurvated right hallux medial border that is painful with no redness or drainage noted and patient is noted to have thickened nailbeds 1-5 both feet that are dystrophic and impossible for him to cut with pain.  Patient has type 2 diabetes under good control and was found to have good digital perfusion well oriented x3     Assessment:  Ingrown toenail deformity chronic in nature right hallux medial border along with painful mycotic nail infections 1-5 both feet     Plan:  H&P x-rays and conditions reviewed and I recommended correction of the ingrown toenail.  I explained procedure risk and after review patient signed consent form.  I infiltrated the right hallux 60 mill grams like Marcaine mixture sterile prep applied to the toe using sterile instrumentation remove the medial border exposed matrix and applied phenol 3 applications 30 seconds followed by alcohol lavage and sterile dressing.  Instructed on leaving dressing on 24 hours but to  take it off earlier if any throbbing were to occur and patient is to be checked back and was given prescription for Corticosporin otic solution.  Debrided nailbeds 1-5 both feet with no iatrogenic bleeding noted

## 2018-04-07 ENCOUNTER — Ambulatory Visit: Payer: Medicare HMO | Admitting: Podiatry

## 2018-07-14 ENCOUNTER — Emergency Department (HOSPITAL_COMMUNITY): Payer: Medicare HMO

## 2018-07-14 ENCOUNTER — Inpatient Hospital Stay (HOSPITAL_COMMUNITY)
Admission: EM | Admit: 2018-07-14 | Discharge: 2018-07-22 | DRG: 083 | Disposition: A | Discharge status: Home Health | Payer: Medicare HMO | Attending: General Surgery | Admitting: General Surgery

## 2018-07-14 ENCOUNTER — Other Ambulatory Visit: Payer: Self-pay

## 2018-07-14 DIAGNOSIS — S065X9A Traumatic subdural hemorrhage with loss of consciousness of unspecified duration, initial encounter: Secondary | ICD-10-CM | POA: Diagnosis present

## 2018-07-14 DIAGNOSIS — I10 Essential (primary) hypertension: Secondary | ICD-10-CM | POA: Diagnosis present

## 2018-07-14 DIAGNOSIS — Z7901 Long term (current) use of anticoagulants: Secondary | ICD-10-CM

## 2018-07-14 DIAGNOSIS — R402362 Coma scale, best motor response, obeys commands, at arrival to emergency department: Secondary | ICD-10-CM | POA: Diagnosis present

## 2018-07-14 DIAGNOSIS — S0101XA Laceration without foreign body of scalp, initial encounter: Secondary | ICD-10-CM | POA: Diagnosis present

## 2018-07-14 DIAGNOSIS — W109XXA Fall (on) (from) unspecified stairs and steps, initial encounter: Secondary | ICD-10-CM | POA: Diagnosis present

## 2018-07-14 DIAGNOSIS — Z955 Presence of coronary angioplasty implant and graft: Secondary | ICD-10-CM

## 2018-07-14 DIAGNOSIS — I4819 Other persistent atrial fibrillation: Secondary | ICD-10-CM | POA: Diagnosis not present

## 2018-07-14 DIAGNOSIS — R402142 Coma scale, eyes open, spontaneous, at arrival to emergency department: Secondary | ICD-10-CM | POA: Diagnosis present

## 2018-07-14 DIAGNOSIS — I480 Paroxysmal atrial fibrillation: Secondary | ICD-10-CM | POA: Diagnosis not present

## 2018-07-14 DIAGNOSIS — S065XAA Traumatic subdural hemorrhage with loss of consciousness status unknown, initial encounter: Secondary | ICD-10-CM | POA: Diagnosis present

## 2018-07-14 DIAGNOSIS — I493 Ventricular premature depolarization: Secondary | ICD-10-CM | POA: Diagnosis present

## 2018-07-14 DIAGNOSIS — R7989 Other specified abnormal findings of blood chemistry: Secondary | ICD-10-CM | POA: Diagnosis not present

## 2018-07-14 DIAGNOSIS — I2510 Atherosclerotic heart disease of native coronary artery without angina pectoris: Secondary | ICD-10-CM | POA: Diagnosis present

## 2018-07-14 DIAGNOSIS — I4891 Unspecified atrial fibrillation: Secondary | ICD-10-CM | POA: Diagnosis not present

## 2018-07-14 DIAGNOSIS — Z7984 Long term (current) use of oral hypoglycemic drugs: Secondary | ICD-10-CM

## 2018-07-14 DIAGNOSIS — I248 Other forms of acute ischemic heart disease: Secondary | ICD-10-CM | POA: Diagnosis present

## 2018-07-14 DIAGNOSIS — Z7982 Long term (current) use of aspirin: Secondary | ICD-10-CM

## 2018-07-14 DIAGNOSIS — Z7902 Long term (current) use of antithrombotics/antiplatelets: Secondary | ICD-10-CM | POA: Diagnosis not present

## 2018-07-14 DIAGNOSIS — E785 Hyperlipidemia, unspecified: Secondary | ICD-10-CM | POA: Diagnosis present

## 2018-07-14 DIAGNOSIS — Y92009 Unspecified place in unspecified non-institutional (private) residence as the place of occurrence of the external cause: Secondary | ICD-10-CM | POA: Diagnosis not present

## 2018-07-14 DIAGNOSIS — Z8249 Family history of ischemic heart disease and other diseases of the circulatory system: Secondary | ICD-10-CM

## 2018-07-14 DIAGNOSIS — S2241XA Multiple fractures of ribs, right side, initial encounter for closed fracture: Secondary | ICD-10-CM | POA: Diagnosis present

## 2018-07-14 DIAGNOSIS — I609 Nontraumatic subarachnoid hemorrhage, unspecified: Secondary | ICD-10-CM

## 2018-07-14 DIAGNOSIS — Z20828 Contact with and (suspected) exposure to other viral communicable diseases: Secondary | ICD-10-CM | POA: Diagnosis present

## 2018-07-14 DIAGNOSIS — E119 Type 2 diabetes mellitus without complications: Secondary | ICD-10-CM | POA: Diagnosis present

## 2018-07-14 DIAGNOSIS — Z823 Family history of stroke: Secondary | ICD-10-CM

## 2018-07-14 DIAGNOSIS — R402252 Coma scale, best verbal response, oriented, at arrival to emergency department: Secondary | ICD-10-CM | POA: Diagnosis present

## 2018-07-14 DIAGNOSIS — Z8349 Family history of other endocrine, nutritional and metabolic diseases: Secondary | ICD-10-CM | POA: Diagnosis not present

## 2018-07-14 LAB — CBC WITH DIFFERENTIAL/PLATELET
Abs Immature Granulocytes: 0.09 10*3/uL — ABNORMAL HIGH (ref 0.00–0.07)
Basophils Absolute: 0.1 10*3/uL (ref 0.0–0.1)
Basophils Relative: 1 %
Eosinophils Absolute: 0.1 10*3/uL (ref 0.0–0.5)
Eosinophils Relative: 1 %
HCT: 47.8 % (ref 39.0–52.0)
Hemoglobin: 15.7 g/dL (ref 13.0–17.0)
Immature Granulocytes: 1 %
Lymphocytes Relative: 20 %
Lymphs Abs: 1.9 10*3/uL (ref 0.7–4.0)
MCH: 29.2 pg (ref 26.0–34.0)
MCHC: 32.8 g/dL (ref 30.0–36.0)
MCV: 89 fL (ref 80.0–100.0)
Monocytes Absolute: 0.7 10*3/uL (ref 0.1–1.0)
Monocytes Relative: 7 %
Neutro Abs: 6.7 10*3/uL (ref 1.7–7.7)
Neutrophils Relative %: 70 %
Platelets: 189 10*3/uL (ref 150–400)
RBC: 5.37 MIL/uL (ref 4.22–5.81)
RDW: 13.2 % (ref 11.5–15.5)
WBC: 9.4 10*3/uL (ref 4.0–10.5)
nRBC: 0 % (ref 0.0–0.2)

## 2018-07-14 LAB — COMPREHENSIVE METABOLIC PANEL
ALT: 41 U/L (ref 0–44)
AST: 36 U/L (ref 15–41)
Albumin: 4.1 g/dL (ref 3.5–5.0)
Alkaline Phosphatase: 48 U/L (ref 38–126)
Anion gap: 11 (ref 5–15)
BUN: 26 mg/dL — ABNORMAL HIGH (ref 8–23)
CO2: 22 mmol/L (ref 22–32)
Calcium: 9 mg/dL (ref 8.9–10.3)
Chloride: 104 mmol/L (ref 98–111)
Creatinine, Ser: 1.13 mg/dL (ref 0.61–1.24)
GFR calc Af Amer: >60 mL/min (ref >60)
GFR calc non Af Amer: >60 mL/min (ref >60)
Glucose, Bld: 283 mg/dL — ABNORMAL HIGH (ref 70–99)
Potassium: 4 mmol/L (ref 3.5–5.1)
Sodium: 137 mmol/L (ref 135–145)
Total Bilirubin: 0.8 mg/dL (ref 0.3–1.2)
Total Protein: 6.5 g/dL (ref 6.5–8.1)

## 2018-07-14 LAB — URINALYSIS, ROUTINE W REFLEX MICROSCOPIC
Bacteria, UA: NONE SEEN
Bilirubin Urine: NEGATIVE
Glucose, UA: >=500 mg/dL — AB
Ketones, ur: 5 mg/dL — AB
Leukocytes,Ua: NEGATIVE
Nitrite: NEGATIVE
Protein, ur: NEGATIVE mg/dL
Specific Gravity, Urine: 1.03 (ref 1.005–1.030)
pH: 5 (ref 5.0–8.0)

## 2018-07-14 LAB — RAPID URINE DRUG SCREEN, HOSP PERFORMED
Amphetamines: NOT DETECTED
Barbiturates: NOT DETECTED
Benzodiazepines: NOT DETECTED
Cocaine: NOT DETECTED
Opiates: NOT DETECTED
Tetrahydrocannabinol: NOT DETECTED

## 2018-07-14 LAB — GLUCOSE, CAPILLARY
Glucose-Capillary: 122 mg/dL — ABNORMAL HIGH (ref 70–99)
Glucose-Capillary: 135 mg/dL — ABNORMAL HIGH (ref 70–99)
Glucose-Capillary: 192 mg/dL — ABNORMAL HIGH (ref 70–99)
Glucose-Capillary: 258 mg/dL — ABNORMAL HIGH (ref 70–99)
Glucose-Capillary: 400 mg/dL — ABNORMAL HIGH (ref 70–99)

## 2018-07-14 LAB — SARS CORONAVIRUS 2 BY RT PCR (HOSPITAL ORDER, PERFORMED IN ~~LOC~~ HOSPITAL LAB): SARS Coronavirus 2: NEGATIVE

## 2018-07-14 LAB — LACTIC ACID, PLASMA
Lactic Acid, Venous: 3.4 mmol/L (ref 0.5–1.9)
Lactic Acid, Venous: 3.1 mmol/L (ref 0.5–1.9)

## 2018-07-14 LAB — CBG MONITORING, ED: Glucose-Capillary: 257 mg/dL — ABNORMAL HIGH (ref 70–99)

## 2018-07-14 LAB — MRSA PCR SCREENING: MRSA by PCR: NEGATIVE

## 2018-07-14 LAB — HIV ANTIBODY (ROUTINE TESTING W REFLEX): HIV Screen 4th Generation wRfx: NONREACTIVE

## 2018-07-14 LAB — HEMOGLOBIN A1C
Hgb A1c MFr Bld: 8.6 % — ABNORMAL HIGH (ref 4.8–5.6)
Mean Plasma Glucose: 200.12 mg/dL

## 2018-07-14 LAB — ETHANOL: Alcohol, Ethyl (B): <10 mg/dL (ref <10)

## 2018-07-14 IMAGING — CT CT HEAD WITHOUT CONTRAST
4 series · 16 of 47 positions shown, 18 images · non-contrast
Comparison: None.

CLINICAL DATA: Unwitnessed fall with back and neck pain. Posterior
head laceration.

EXAM:
CT HEAD WITHOUT CONTRAST
CT CERVICAL SPINE WITHOUT CONTRAST
TECHNIQUE: Multidetector CT imaging of the head and cervical spine was
performed following the standard protocol without intravenous
contrast. Multiplanar CT image reconstructions of the cervical spine
were also generated.

[Series 3: head wo · axial · 0.48mm/px · z∈[-157,-7]mm · 7 of 40 slices shown, 9 images]
[im 5/40  brain]
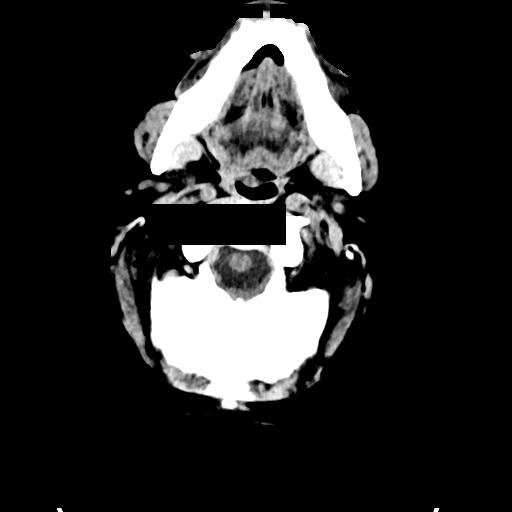
[im 5/40  bone]
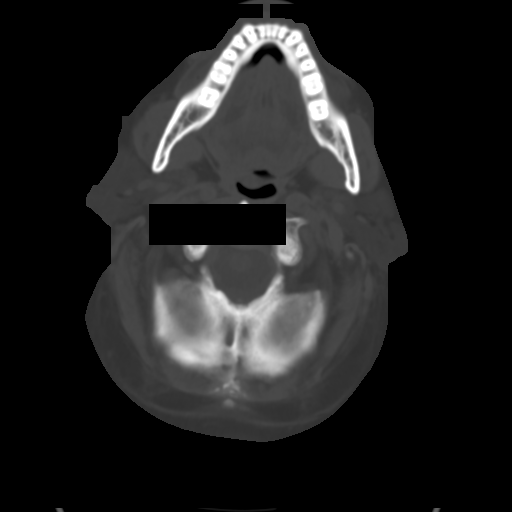
[im 10/40  brain]
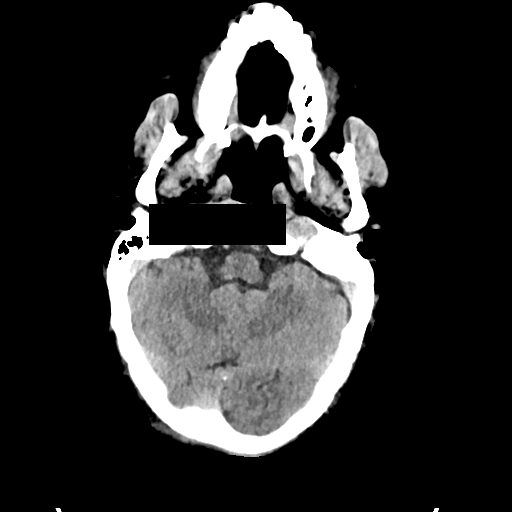
[im 15/40  brain]
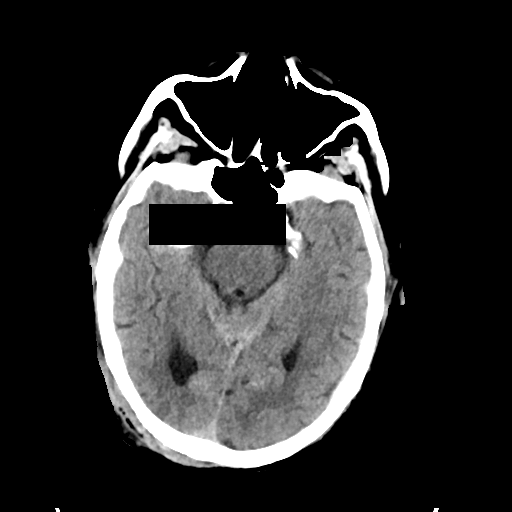
[im 20/40  brain]
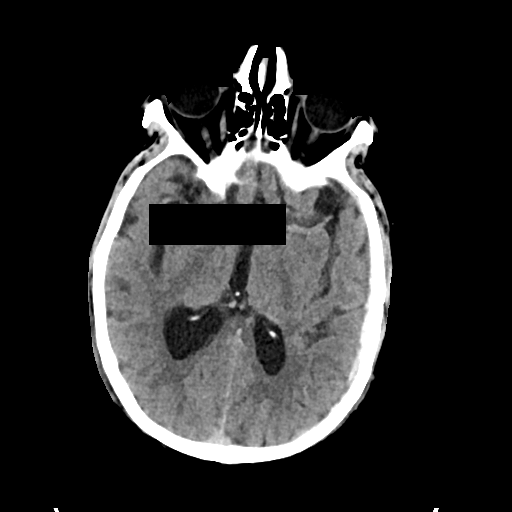
[im 25/40  brain]
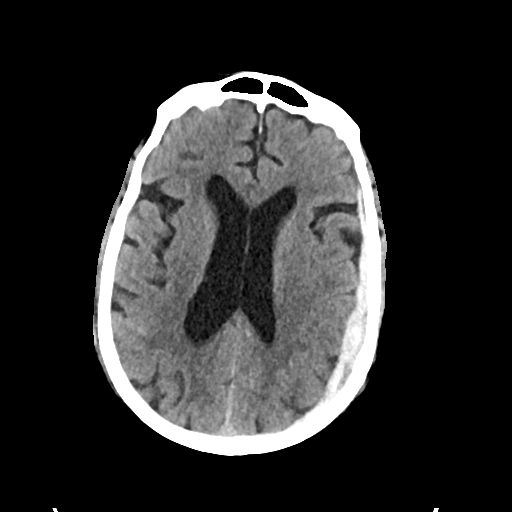
[im 25/40  bone]
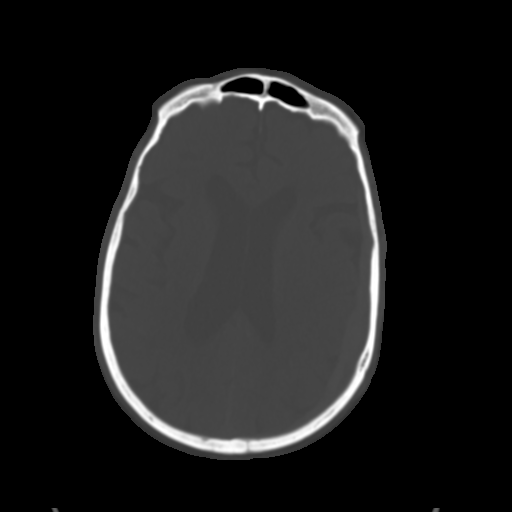
[im 30/40  brain]
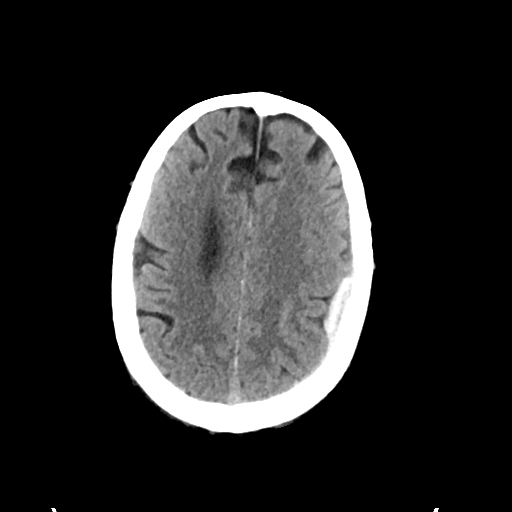
[im 35/40  brain]
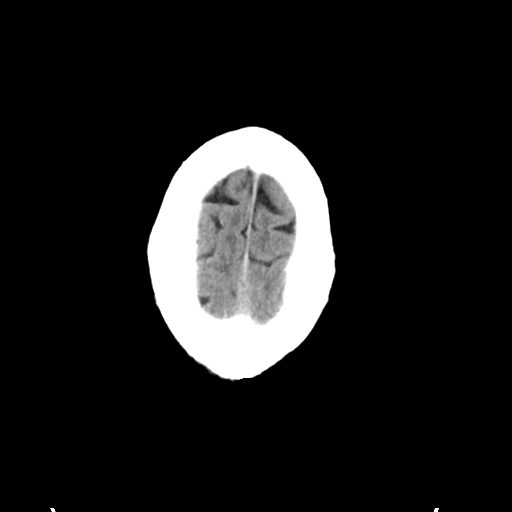

[Series 4: head bone · axial · 0.48mm/px · z∈[-159,-119]mm · 3 of 99 slices shown]
[im 10/99  bone]
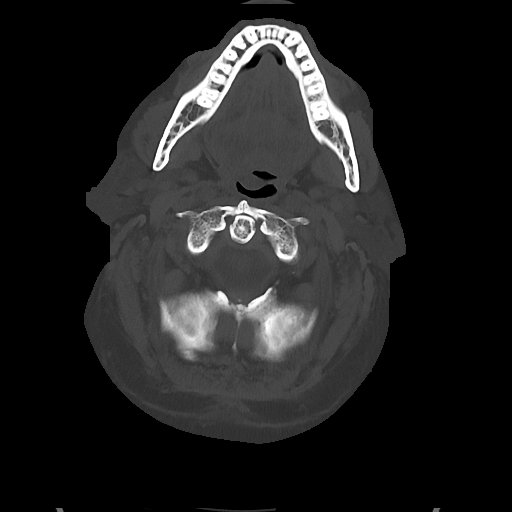
[im 20/99  bone]
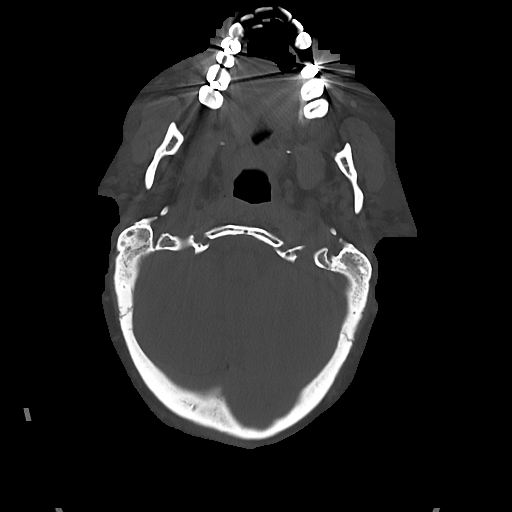
[im 30/99  bone]
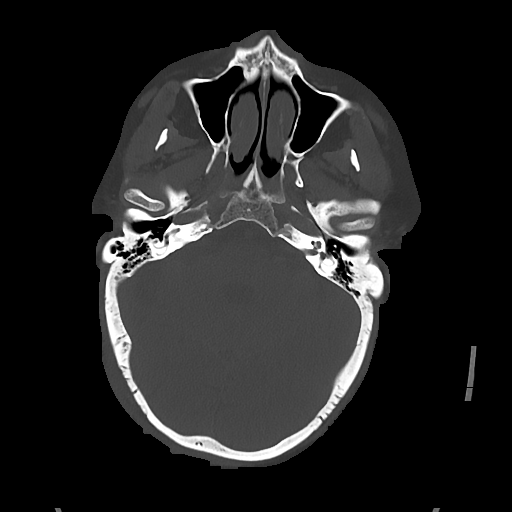

[Series 5: cor soft · coronal · 0.38mm/px · 3 of 75 slices shown]
[im 25/75  brain]
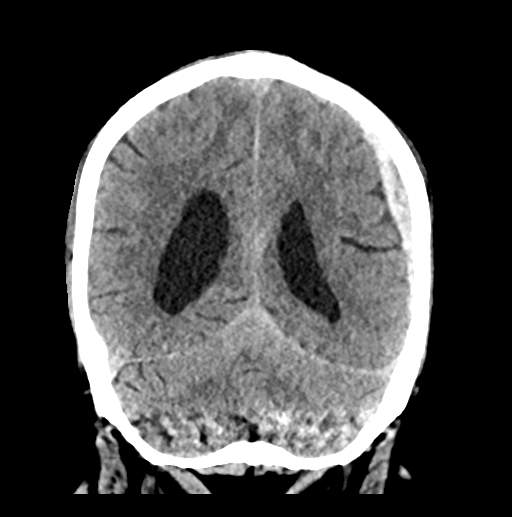
[im 33/75  brain]
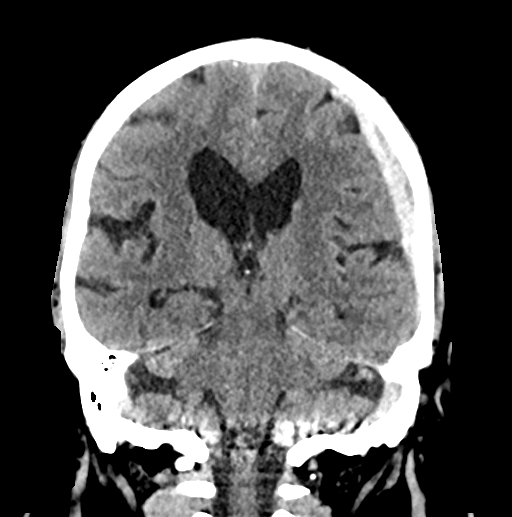
[im 42/75  brain]
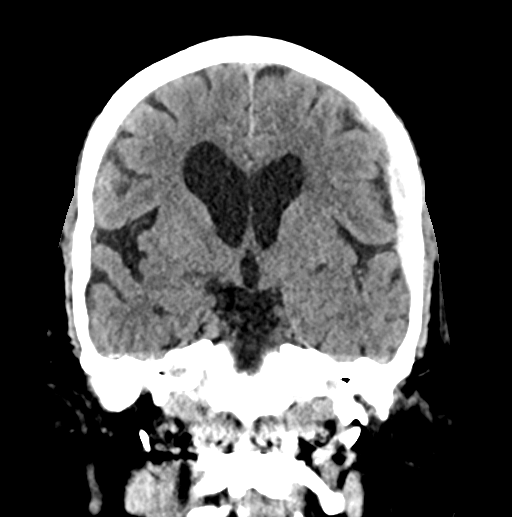

[Series 6: sag soft · sagittal · 0.38mm/px · 3 of 66 slices shown]
[im 22/66  brain]
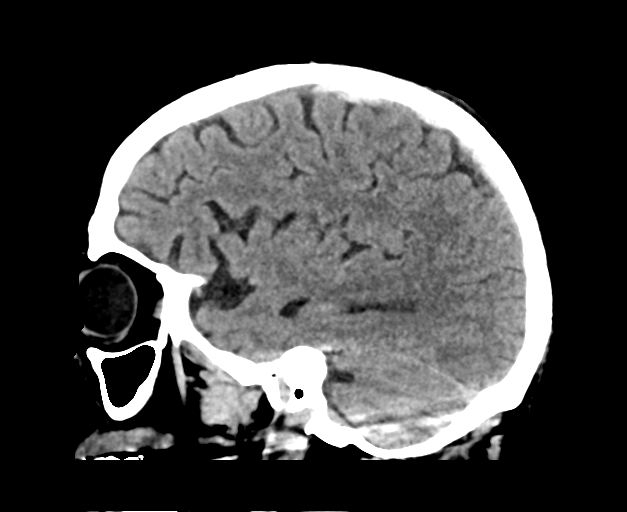
[im 33/66  brain]
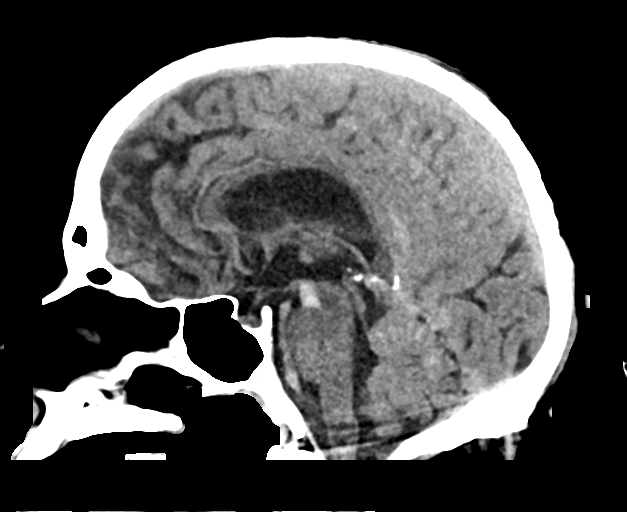
[im 44/66  brain]
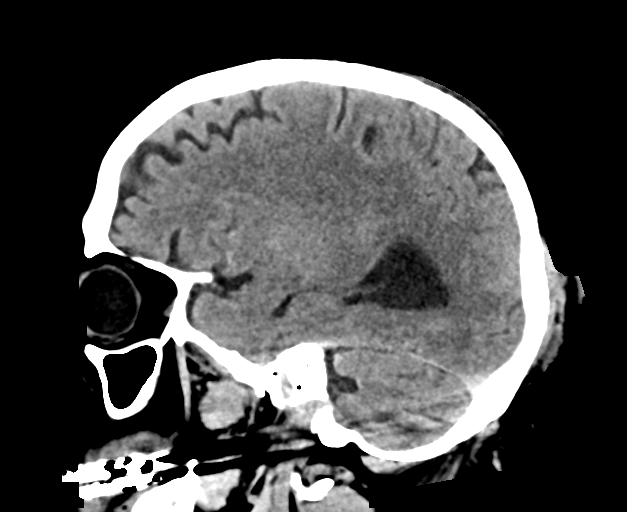

[16 of 47 positions shown; findings below may reference images not displayed]

FINDINGS: CT HEAD FINDINGS

Brain: Crescentic subdural hematoma along the left cerebral
convexity measuring up to 11 mm in thickness. There is associated
cerebral mass effect. No measurable shift on axial slices. Patchy
subarachnoid hemorrhage seen in the interpeduncular fossa, supra
vermian cistern, left CP angle, and occipital/right parietal sulci.
No infarct. Mild lateral ventriculomegaly attributed to volume loss.

Vascular: Negative

Skull: Right posterior scalp laceration and contusion without
underlying calvarial fracture.

Sinuses/Orbits: Bilateral cataract resection.  No acute finding

Critical Value/emergent results were called by telephone at the time
of interpretation on [DATE] at [DATE] to Dr. AUJLA , who
verbally acknowledged these results.

CT CERVICAL SPINE FINDINGS

Alignment: Normal

Skull base and vertebrae: Negative for fracture. Dysmorphic upper
cervical posterior elements, incidental.

Soft tissues and spinal canal: No prevertebral fluid or swelling. No
visible canal hematoma. A contusion is partially covered superficial
to the right scapula, reference chest CT.

Disc levels:  No evidence of degenerative impingement

Upper chest: Reported separately
IMPRESSION: 1. Acute subdural hematoma along the left cerebral convexity
measuring up to 11 mm in thickness. Mild cerebral mass effect.
2. Patchy subarachnoid hemorrhage with a traumatic pattern.
3. Negative for cervical spine fracture.

## 2018-07-14 IMAGING — DX PORTABLE CHEST - 1 VIEW
1 series · 1 of 1 positions shown · non-contrast
Comparison: [DATE]

CLINICAL DATA: Unwitnessed fall.  Back pain

EXAM:
PORTABLE CHEST 1 VIEW

[chest ap]
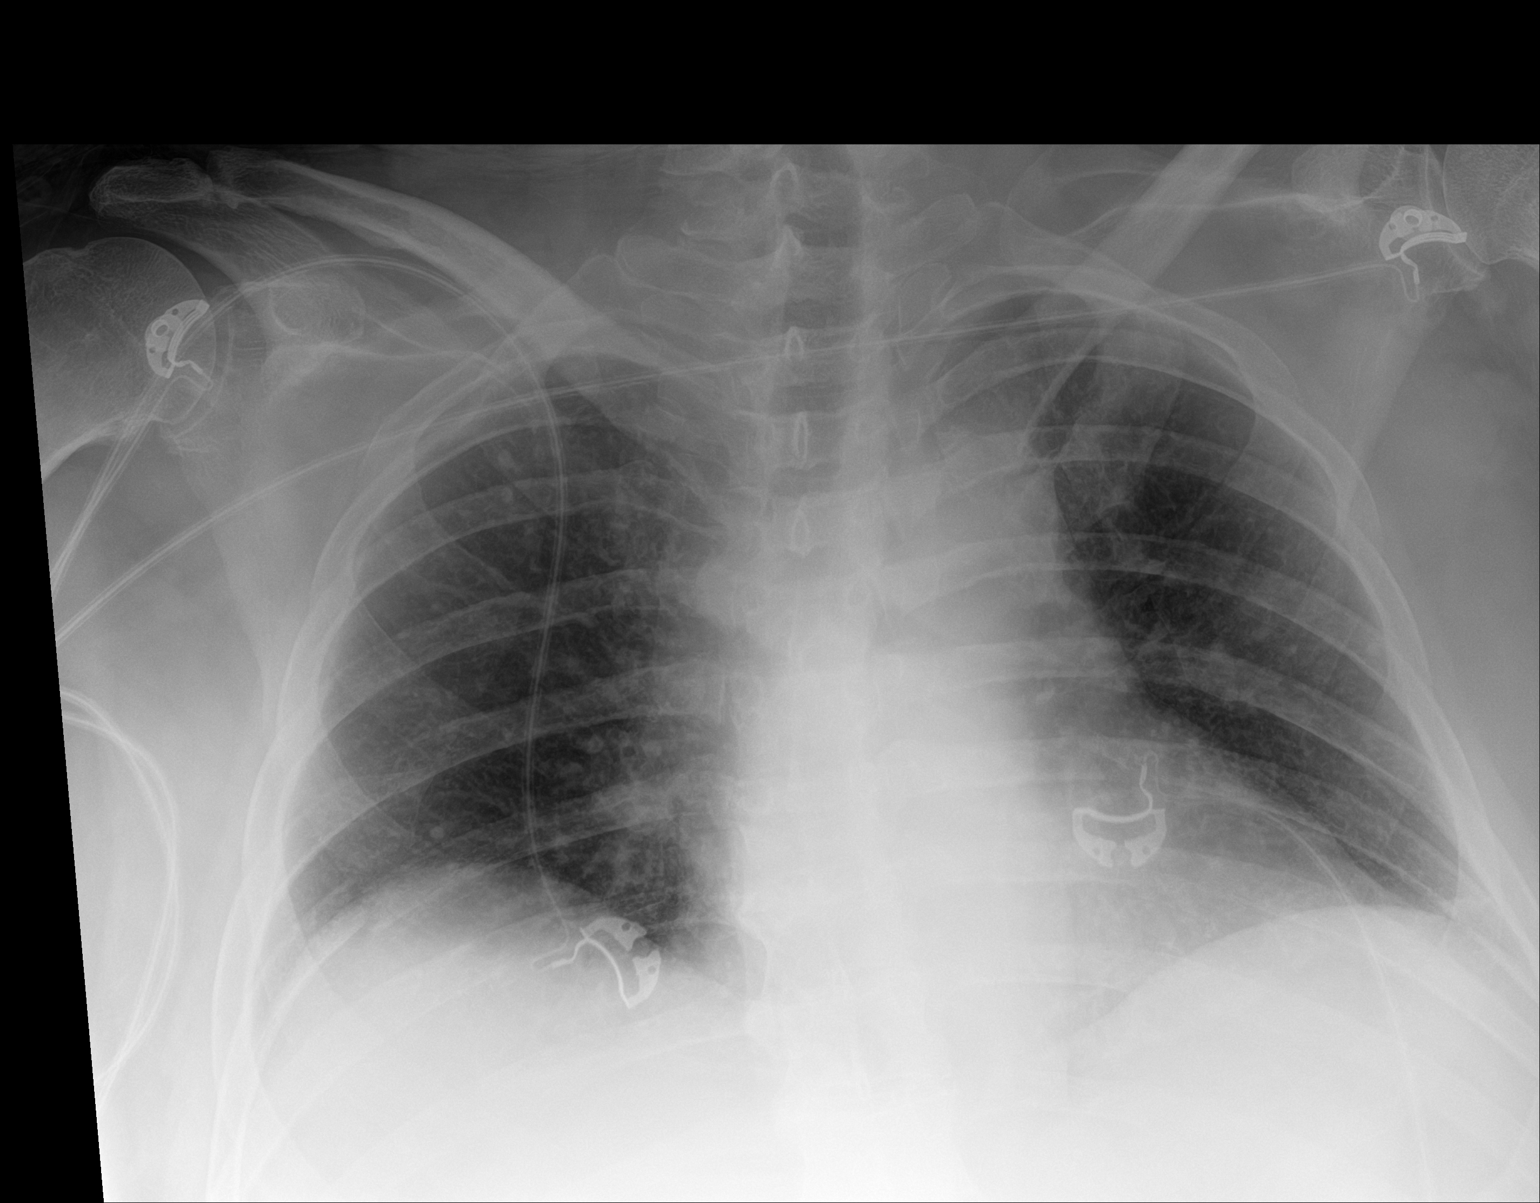

[1 of 1 positions shown; findings below may reference images not displayed]

FINDINGS: Low lung volumes, bibasilar atelectasis. Heart is borderline in
size. No visible effusions or pneumothorax. Posterior right 7th rib
fracture.
IMPRESSION: Low lung volumes, bibasilar atelectasis.

Posterior right 7th rib fracture.  No pneumothorax.

## 2018-07-14 IMAGING — CT CT CERVICAL SPINE WITHOUT CONTRAST
4 series · 14 of 33 positions shown, 16 images · non-contrast
Comparison: None.

CLINICAL DATA: Unwitnessed fall with back and neck pain. Posterior
head laceration.

EXAM:
CT HEAD WITHOUT CONTRAST
CT CERVICAL SPINE WITHOUT CONTRAST
TECHNIQUE: Multidetector CT imaging of the head and cervical spine was
performed following the standard protocol without intravenous
contrast. Multiplanar CT image reconstructions of the cervical spine
were also generated.

[Series 5: c spine soft · axial · 0.35mm/px · z∈[-262,-236]mm · 2 of 92 slices shown]
[im 14/92  soft-tissue]
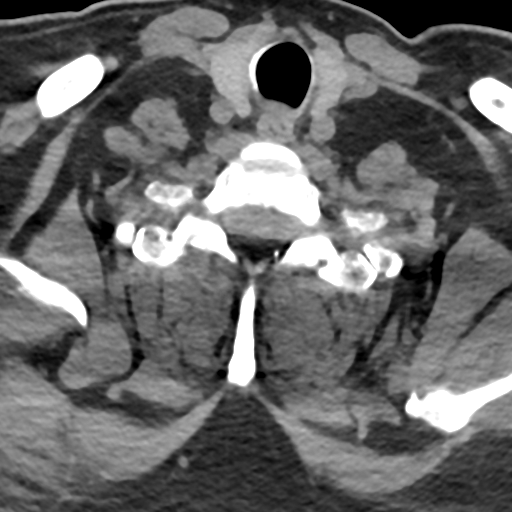
[im 27/92  soft-tissue]
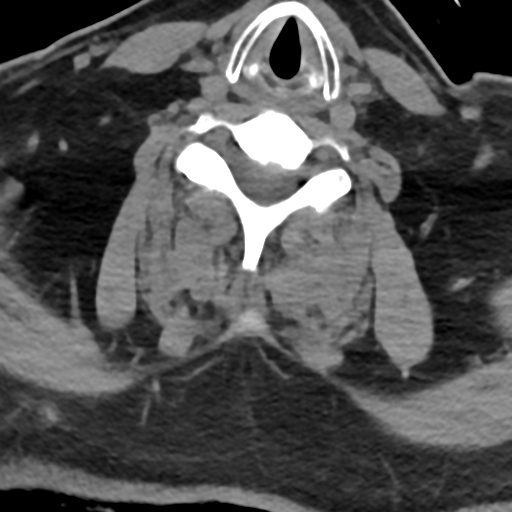

[Series 6: sag bone · sagittal · 0.27mm/px · 5 of 65 slices shown, 6 images]
[im 22/65  bone]
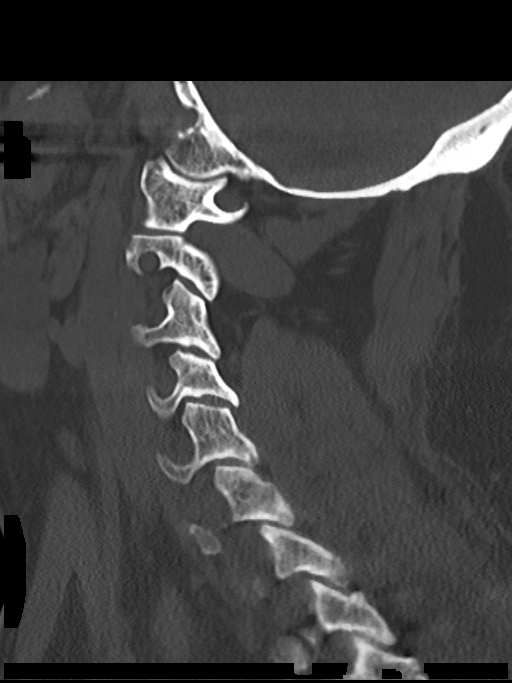
[im 27/65  bone]
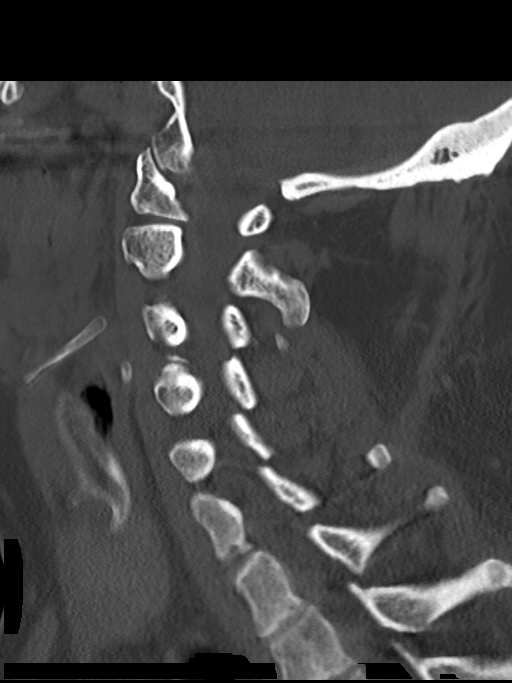
[im 33/65  soft-tissue]
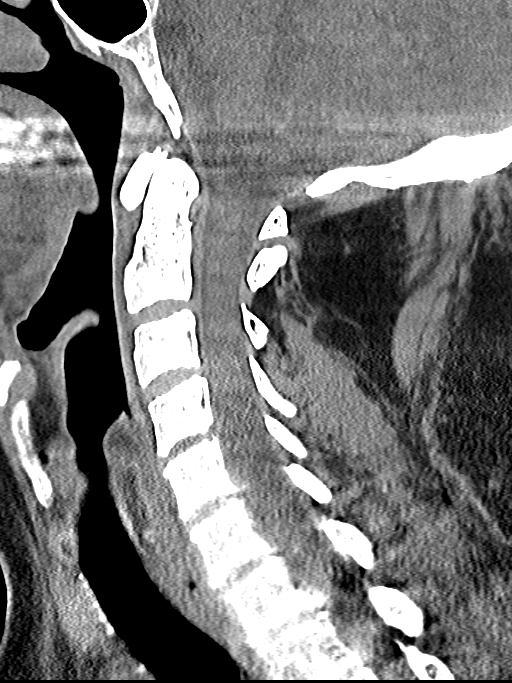
[im 33/65  bone]
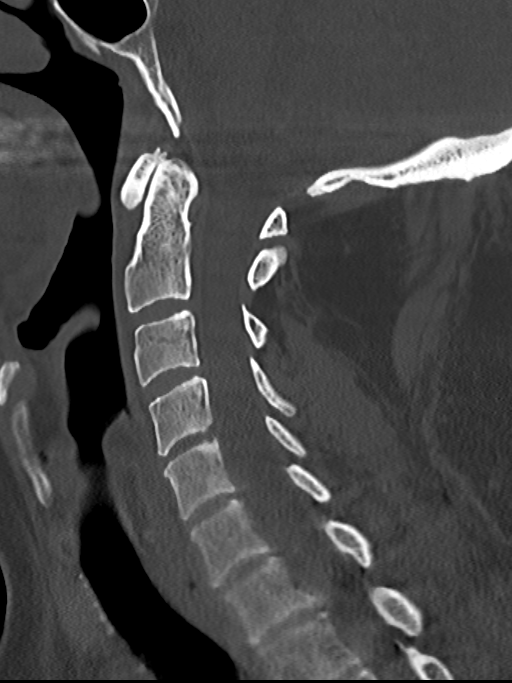
[im 38/65  bone]
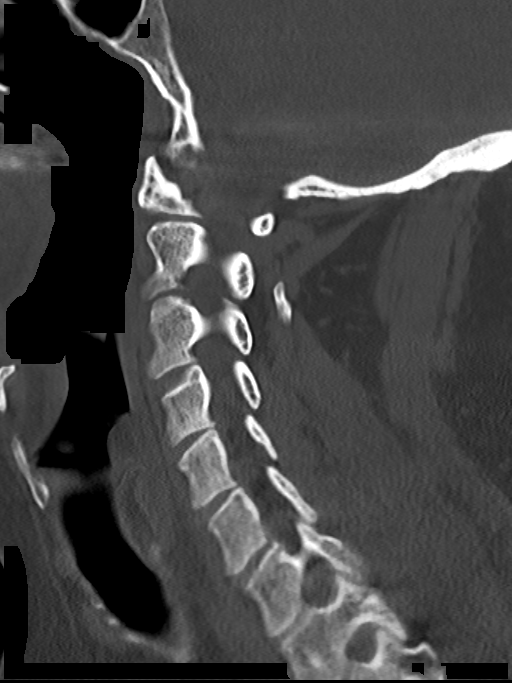
[im 43/65  bone]
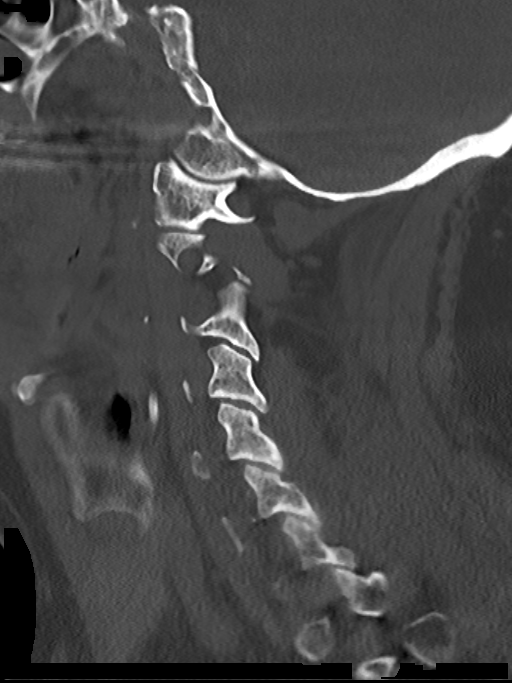

[Series 7: cor bone · coronal · 0.27mm/px · 3 of 69 slices shown]
[im 14/69  bone]
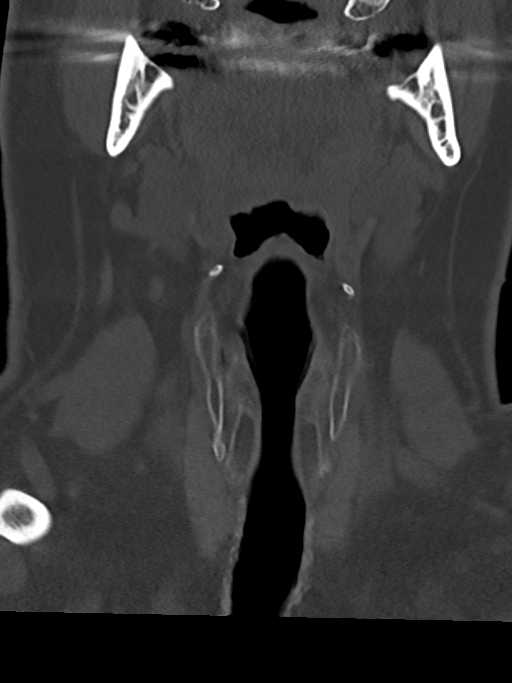
[im 28/69  bone]
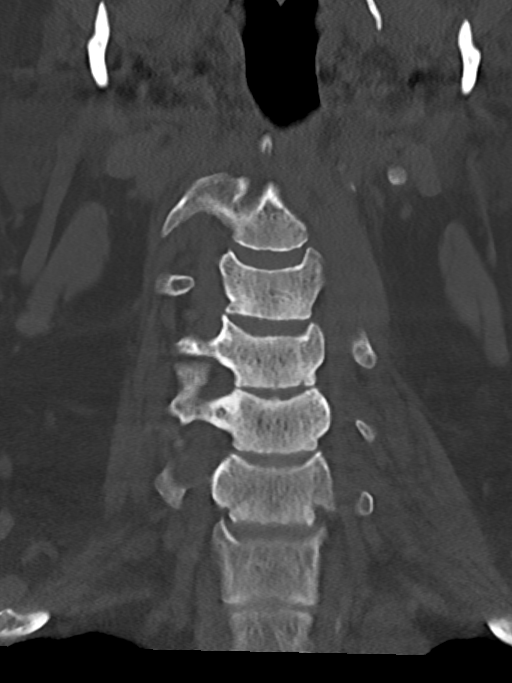
[im 41/69  bone]
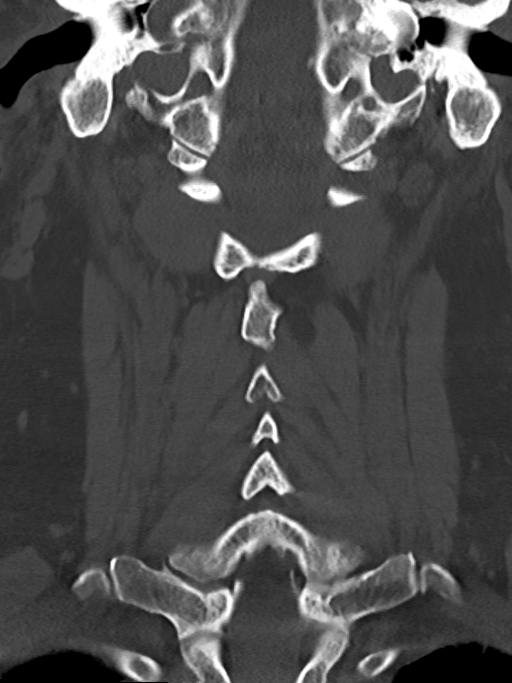

[Series 8: orthogonal axials · axial · 0.21mm/px · z∈[-260,-174]mm · 4 of 65 slices shown, 5 images]
[im 13/65  soft-tissue]
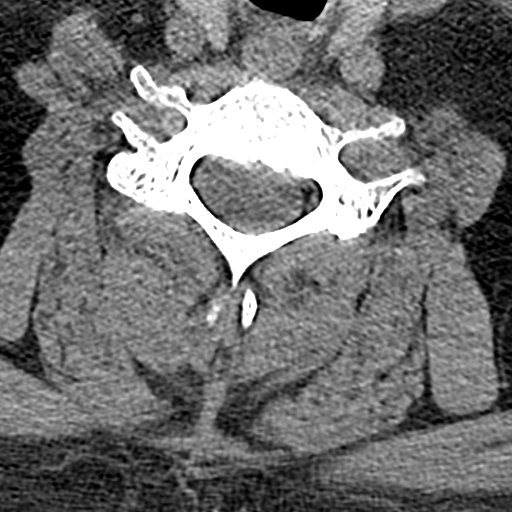
[im 13/65  bone]
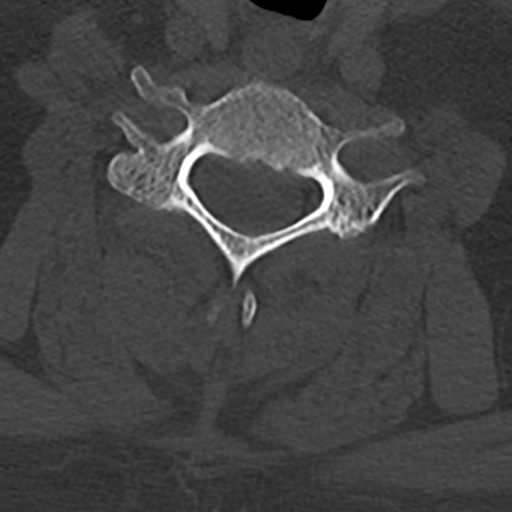
[im 26/65  bone]
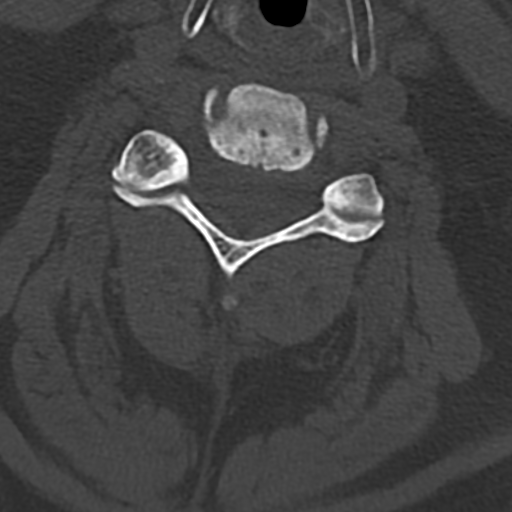
[im 39/65  bone]
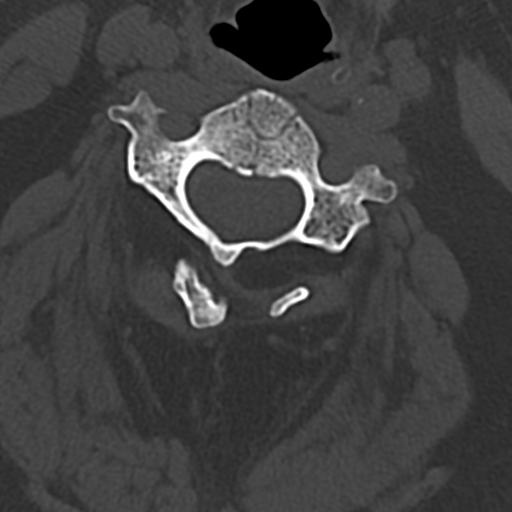
[im 52/65  bone]
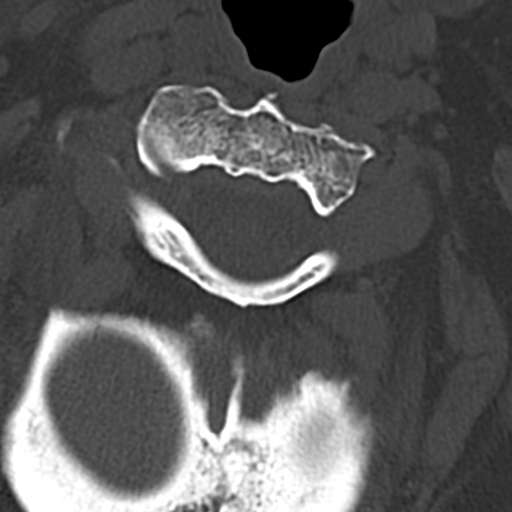

[14 of 33 positions shown; findings below may reference images not displayed]

FINDINGS: CT HEAD FINDINGS

Brain: Crescentic subdural hematoma along the left cerebral
convexity measuring up to 11 mm in thickness. There is associated
cerebral mass effect. No measurable shift on axial slices. Patchy
subarachnoid hemorrhage seen in the interpeduncular fossa, supra
vermian cistern, left CP angle, and occipital/right parietal sulci.
No infarct. Mild lateral ventriculomegaly attributed to volume loss.

Vascular: Negative

Skull: Right posterior scalp laceration and contusion without
underlying calvarial fracture.

Sinuses/Orbits: Bilateral cataract resection.  No acute finding

Critical Value/emergent results were called by telephone at the time
of interpretation on [DATE] at [DATE] to Dr. AUJLA , who
verbally acknowledged these results.

CT CERVICAL SPINE FINDINGS

Alignment: Normal

Skull base and vertebrae: Negative for fracture. Dysmorphic upper
cervical posterior elements, incidental.

Soft tissues and spinal canal: No prevertebral fluid or swelling. No
visible canal hematoma. A contusion is partially covered superficial
to the right scapula, reference chest CT.

Disc levels:  No evidence of degenerative impingement

Upper chest: Reported separately
IMPRESSION: 1. Acute subdural hematoma along the left cerebral convexity
measuring up to 11 mm in thickness. Mild cerebral mass effect.
2. Patchy subarachnoid hemorrhage with a traumatic pattern.
3. Negative for cervical spine fracture.

## 2018-07-14 IMAGING — CT CT ABDOMEN AND PELVIS WITH CONTRAST
2 of 5 series · 13 of 46 positions shown, 15 images · IV contrast (omnipaque)
Comparison: None.

CLINICAL DATA: Unwitnessed fall with back pain

EXAM:
CT CHEST, ABDOMEN, AND PELVIS WITH CONTRAST
TECHNIQUE: Multidetector CT imaging of the chest, abdomen and pelvis was
performed following the standard protocol during bolus
administration of intravenous contrast.
CONTRAST:  125mL OMNIPAQUE IOHEXOL 300 MG/ML  SOLN

[Series 3: cap with · axial · 0.97mm/px · z∈[-849,-314]mm · 10 of 131 slices shown, 12 images]
[im 12/131  soft-tissue]
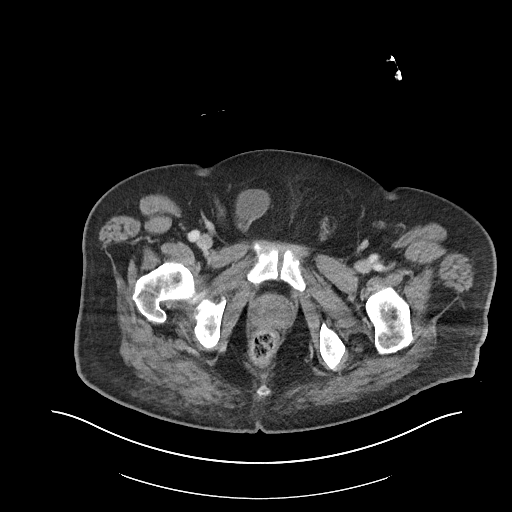
[im 12/131  bone]
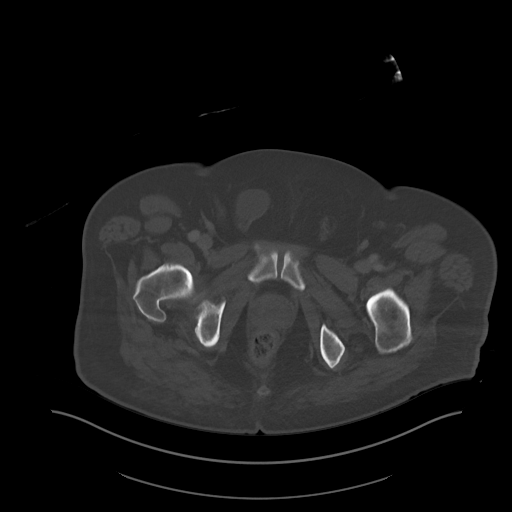
[im 24/131  soft-tissue]
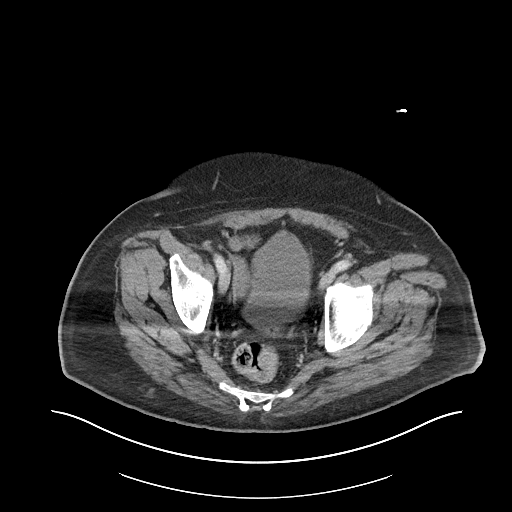
[im 36/131  soft-tissue]
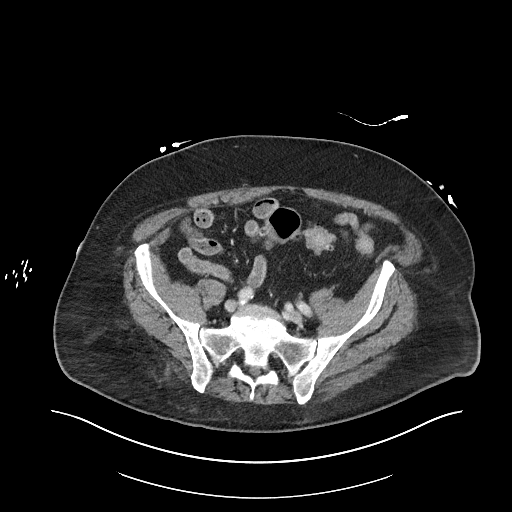
[im 48/131  soft-tissue]
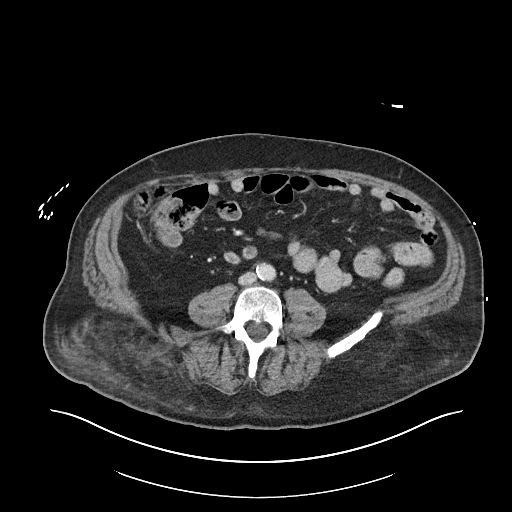
[im 60/131  soft-tissue]
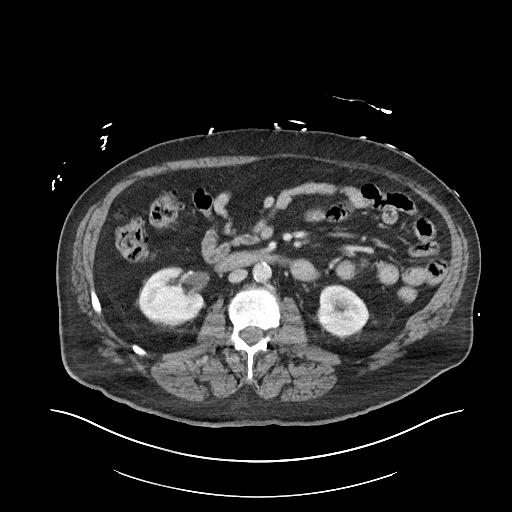
[im 71/131  soft-tissue]
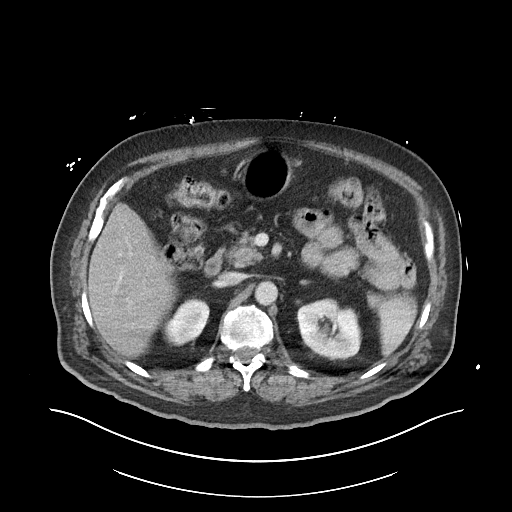
[im 83/131  soft-tissue]
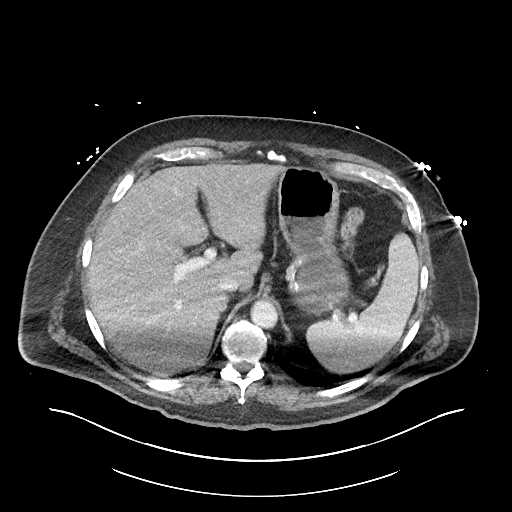
[im 95/131  soft-tissue]
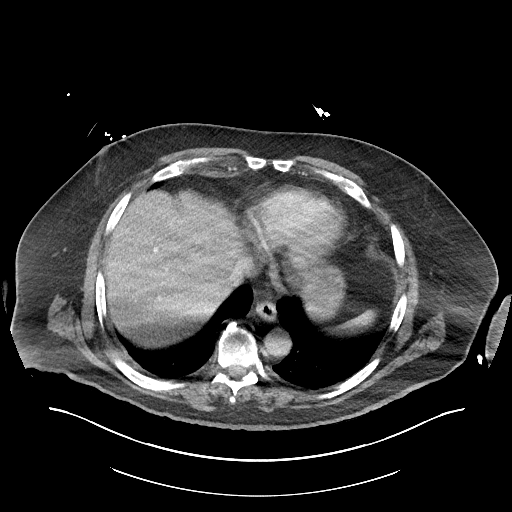
[im 107/131  soft-tissue]
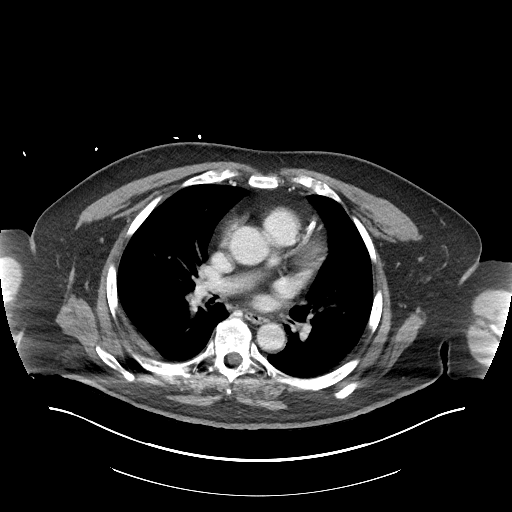
[im 107/131  bone]
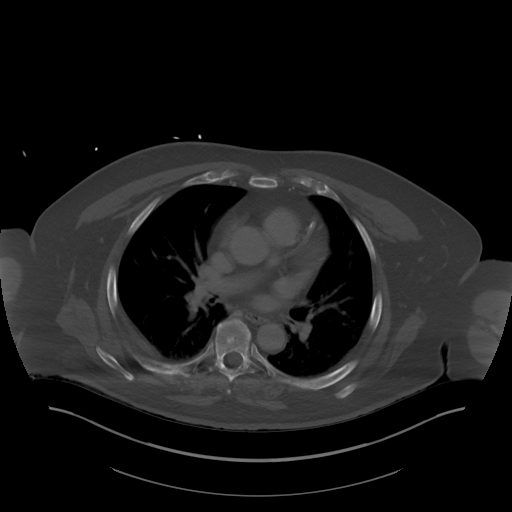
[im 119/131  soft-tissue]
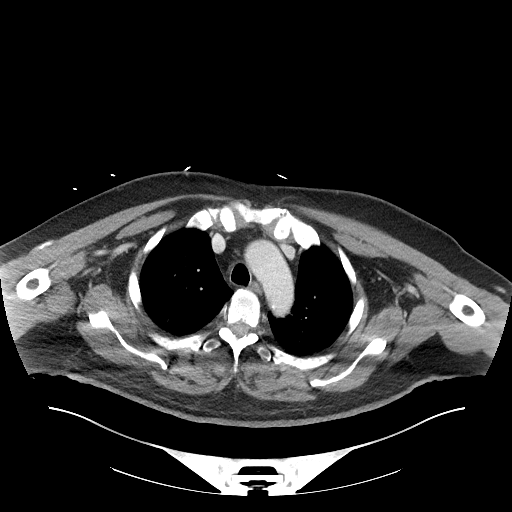

[Series 6: cor · coronal · 0.85mm/px · 3 of 103 slices shown]
[im 35/103  soft-tissue]
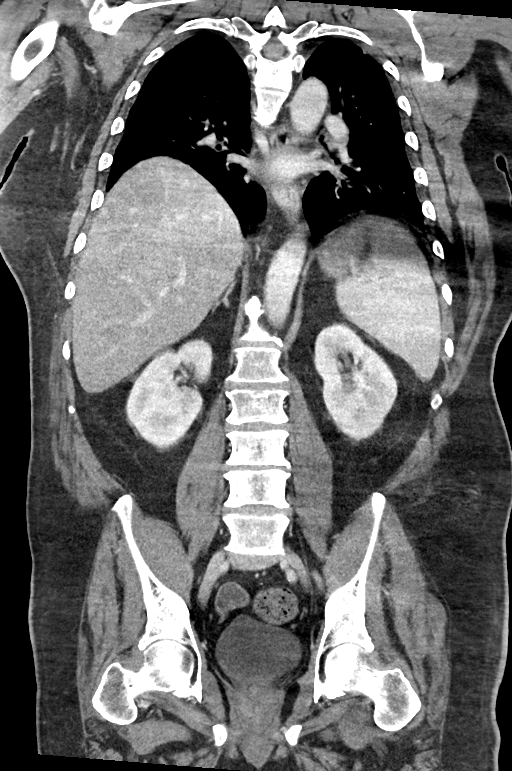
[im 46/103  soft-tissue]
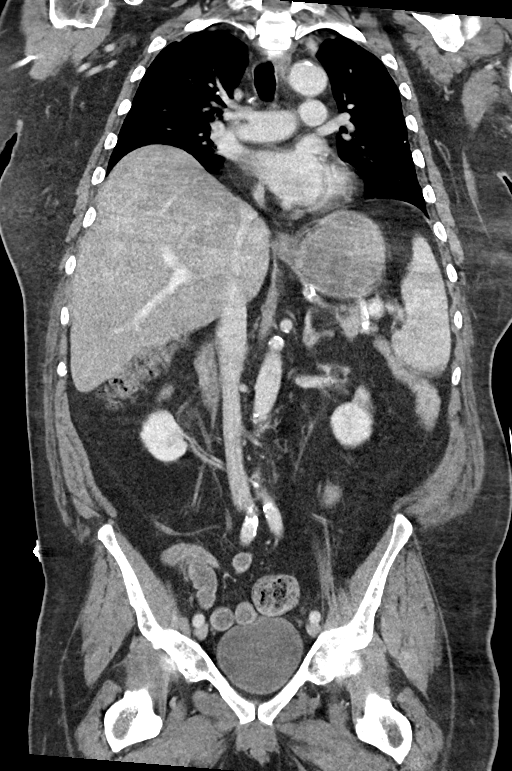
[im 57/103  soft-tissue]
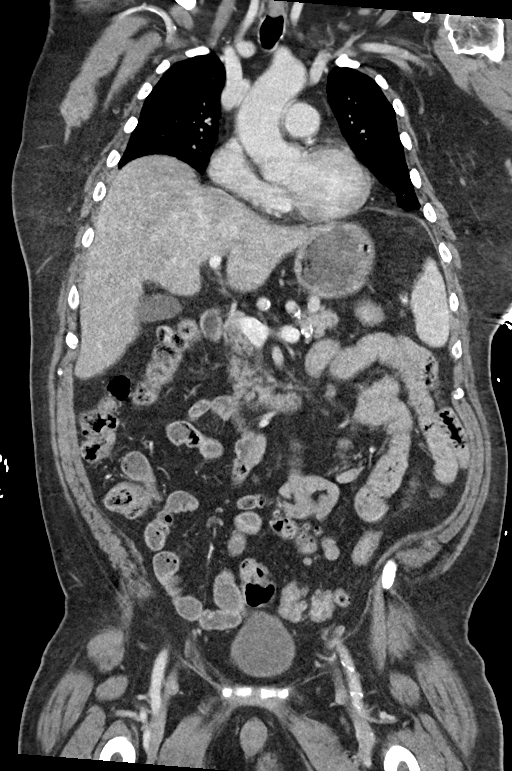

[13 of 46 positions shown; findings below may reference images not displayed]

FINDINGS: CT CHEST FINDINGS

Cardiovascular: Normal heart size. No pericardial effusion. Left and
right coronary circulation atherosclerotic calcification. Aortic
calcification.

Mediastinum/Nodes: Negative for pneumomediastinum or hematoma.

Lungs/Pleura: Mild subpleural hemorrhage in the posterior right
chest associated with rib fractures. No hemothorax, lung contusion,
or pneumothorax. There are scattered calcified pulmonary nodules
consistent with remote granulomatous disease.

Musculoskeletal: Posterior right sixth and seventh rib fractures.
The seventh rib fracture is displaced. The cartilage of the right
seventh rib is also fractured anteriorly. A subcutaneous contusion
is seen over the posterior and right-sided chest wall.

CT ABDOMEN PELVIS FINDINGS

Hepatobiliary: No hepatic injury or perihepatic hematoma.
Gallbladder is unremarkable

Pancreas: Negative

Spleen: Negative

Adrenals/Urinary Tract: No adrenal hemorrhage or renal injury
identified. Bladder is unremarkable.

Stomach/Bowel: No evidence of injury. Colonic diverticulosis that is
generalized

Vascular/Lymphatic: Atherosclerotic calcification

Reproductive: Large partially covered hydrocele on the left.
Hydrocele is also seen on the right, extending into the inguinal
canal.

Other: No ascites or pneumoperitoneum

Musculoskeletal: Subcutaneous contusion involving the right more
than left posterior flank. No associated fracture.
IMPRESSION: 1. Posterior right sixth and seventh rib fractures with seventh rib
fracture displacement. There is also seventh rib cartilage fracture
anteriorly.
2. Soft tissue contusions of the back.
3. No evidence of intra-abdominal injury.

## 2018-07-14 IMAGING — DX PORTABLE PELVIS 1-2 VIEWS
1 series · 1 of 1 positions shown · non-contrast
Comparison: None.

CLINICAL DATA: Unwitnessed fall

EXAM:
PORTABLE PELVIS 1-2 VIEWS

[pelvis ap]
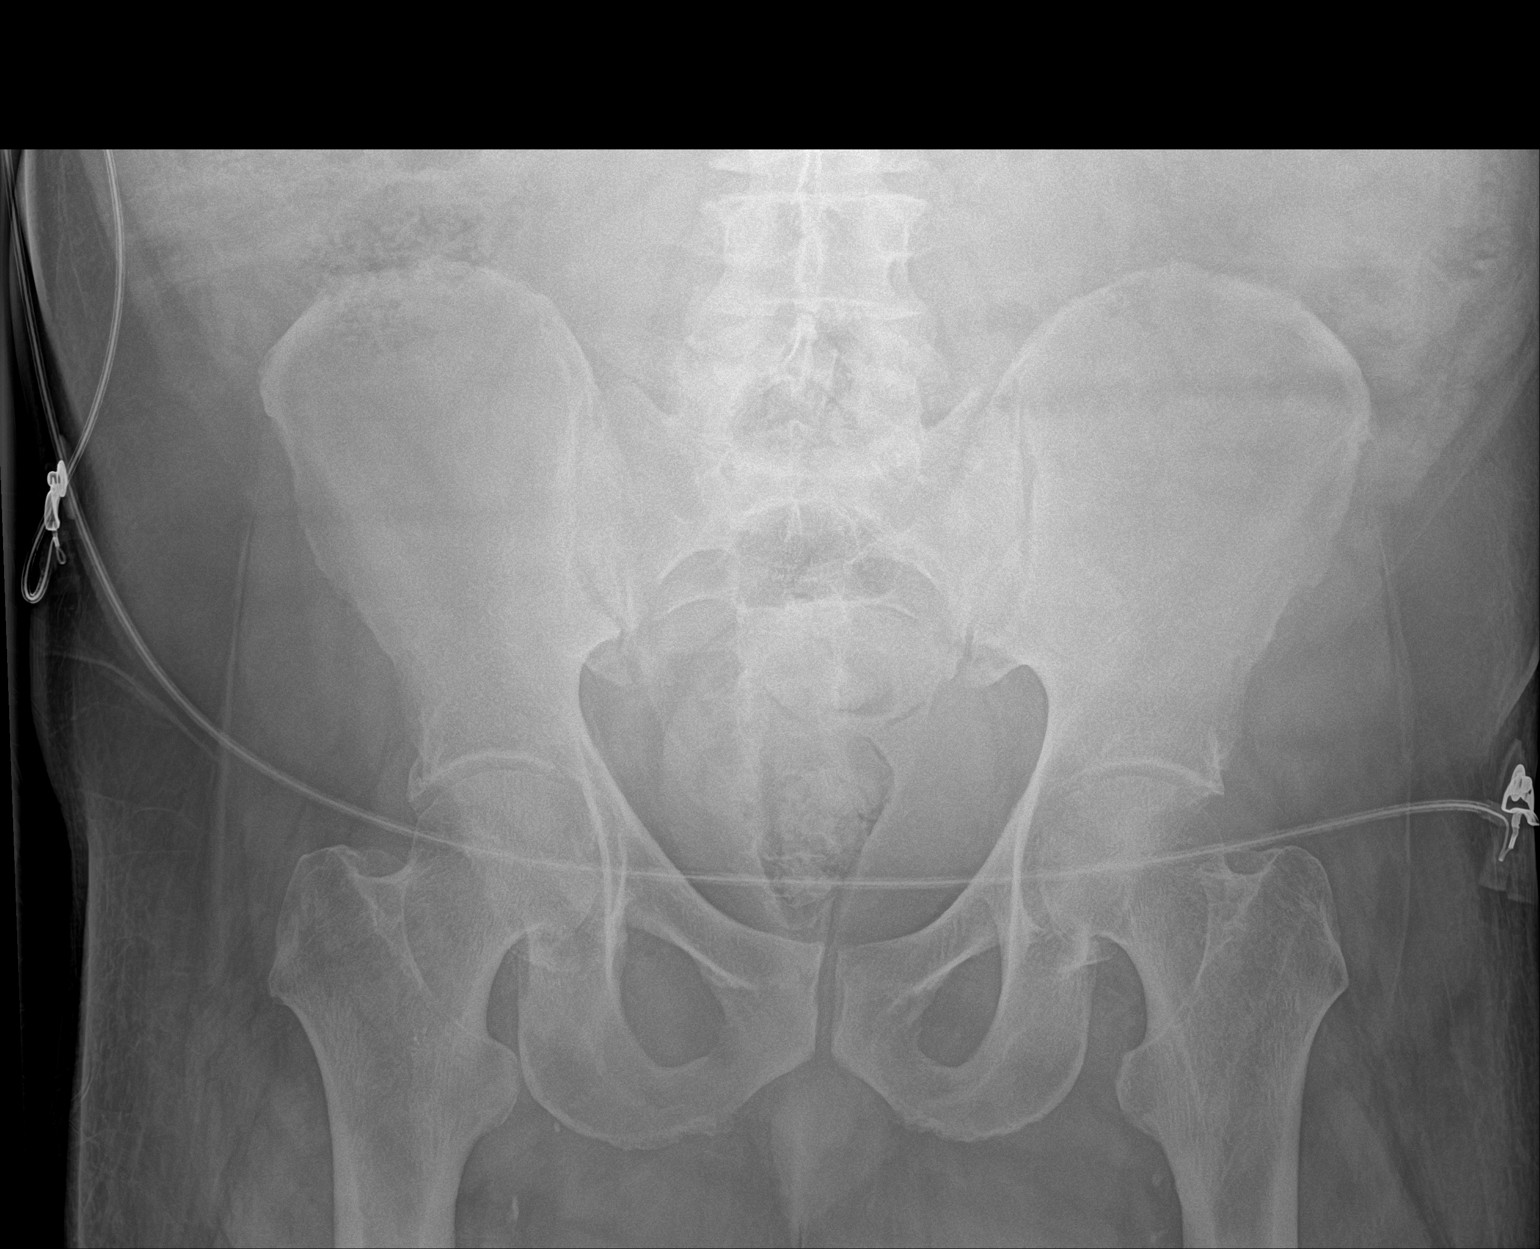

[1 of 1 positions shown; findings below may reference images not displayed]

FINDINGS: There is no evidence of pelvic fracture or diastasis. No pelvic bone
lesions are seen.
IMPRESSION: Negative.

## 2018-07-14 MED ORDER — IOHEXOL 300 MG/ML  SOLN
125.0000 mL | Freq: Once | INTRAMUSCULAR | Status: AC | PRN
  Administered 2018-07-14: 125 mL via INTRAVENOUS

## 2018-07-14 MED ORDER — LIDOCAINE-EPINEPHRINE 2 %-1:100000 IJ SOLN
20.0000 mL | Freq: Once | INTRAMUSCULAR | Status: AC
  Administered 2018-07-14: 20 mL
  Filled 2018-07-14: qty 20

## 2018-07-14 MED ORDER — MORPHINE SULFATE (PF) 4 MG/ML IV SOLN: 4.0000 mg | INTRAVENOUS | Status: DC | PRN

## 2018-07-14 MED ORDER — METOPROLOL TARTRATE 5 MG/5ML IV SOLN: 5.0000 mg | Freq: Four times a day (QID) | INTRAVENOUS | Status: DC | PRN

## 2018-07-14 MED ORDER — POTASSIUM CHLORIDE IN NACL 20-0.9 MEQ/L-% IV SOLN
INTRAVENOUS | Status: DC
  Administered 2018-07-14 – 2018-07-15 (×3): via INTRAVENOUS
  Filled 2018-07-14 (×6): qty 1000

## 2018-07-14 MED ORDER — ONDANSETRON 4 MG PO TBDP
4.0000 mg | ORAL_TABLET | Freq: Four times a day (QID) | ORAL | Status: DC | PRN
  Filled 2018-07-14: qty 1

## 2018-07-14 MED ORDER — LOSARTAN POTASSIUM 50 MG PO TABS
50.0000 mg | ORAL_TABLET | Freq: Every day | ORAL | Status: DC
  Administered 2018-07-15 – 2018-07-22 (×8): 50 mg via ORAL
  Filled 2018-07-14 (×8): qty 1

## 2018-07-14 MED ORDER — LACTATED RINGERS IV BOLUS
2000.0000 mL | Freq: Once | INTRAVENOUS | Status: AC
  Administered 2018-07-14: 2000 mL via INTRAVENOUS

## 2018-07-14 MED ORDER — ONDANSETRON HCL 4 MG/2ML IJ SOLN: 4.0000 mg | Freq: Four times a day (QID) | INTRAMUSCULAR | Status: DC | PRN

## 2018-07-14 MED ORDER — ATORVASTATIN CALCIUM 80 MG PO TABS
80.0000 mg | ORAL_TABLET | Freq: Every day | ORAL | Status: DC
  Administered 2018-07-15 – 2018-07-22 (×8): 80 mg via ORAL
  Filled 2018-07-14 (×8): qty 1

## 2018-07-14 MED ORDER — INSULIN ASPART 100 UNIT/ML ~~LOC~~ SOLN
0.0000 [IU] | Freq: Three times a day (TID) | SUBCUTANEOUS | Status: DC
  Administered 2018-07-14: 5 [IU] via SUBCUTANEOUS
  Administered 2018-07-14: 2 [IU] via SUBCUTANEOUS
  Administered 2018-07-14: 9 [IU] via SUBCUTANEOUS
  Administered 2018-07-15: 2 [IU] via SUBCUTANEOUS
  Administered 2018-07-15: 1 [IU] via SUBCUTANEOUS
  Administered 2018-07-16: 3 [IU] via SUBCUTANEOUS

## 2018-07-14 MED ORDER — MORPHINE SULFATE (PF) 2 MG/ML IV SOLN
2.0000 mg | INTRAVENOUS | Status: DC | PRN
  Administered 2018-07-14 – 2018-07-17 (×7): 2 mg via INTRAVENOUS
  Filled 2018-07-14 (×7): qty 1

## 2018-07-14 MED ORDER — METOPROLOL SUCCINATE ER 25 MG PO TB24
25.0000 mg | ORAL_TABLET | Freq: Every day | ORAL | Status: DC
  Administered 2018-07-15 – 2018-07-18 (×4): 25 mg via ORAL
  Filled 2018-07-14 (×4): qty 1

## 2018-07-14 MED ORDER — FENTANYL CITRATE (PF) 100 MCG/2ML IJ SOLN
100.0000 ug | Freq: Once | INTRAMUSCULAR | Status: AC
  Administered 2018-07-14: 100 ug via INTRAVENOUS
  Filled 2018-07-14: qty 2

## 2018-07-14 NOTE — Progress Notes (Signed)
Spoke with [name] Frank over the phone and updated on his current condition and plan for repeat head CT in the morning. All questions were answered.

## 2018-07-14 NOTE — ED Provider Notes (Signed)
Emergency Department Provider Note   I have reviewed the triage vital signs and the nursing notes.   HISTORY  Chief Complaint Fall   HPI [name] Frank is a 68 y.o. male who presents the emergency department as a Level II Trauma with EMS secondary to an unwitnessed fall and low GCS.  Patient is disoriented and not able to offer much history does not remember the fall.  He complained of pain in his mid back right chest posterior head.  EMS states that his blood sugar was high on arrival vital signs are otherwise normal.  Has a laceration to the back of his head.  I talked to his wife and she states she heard one big loud thump and that was it.  He was found at the bottom of the steps but according to her she he was too far away to actually fallen off the steps she thinks he may have just passed out on his way to the steps.   No other associated or modifying symptoms.   LEVEL V CAVEAT SECONDARY TO HEAD INJURY  Past Medical History:  Diagnosis Date   Diabetes mellitus    Heart disease    Hyperlipemia    Hypertension     Patient Active Problem List   Diagnosis Date Noted   Hypertension, essential 01/16/2018   Controlled type 2 diabetes mellitus without complication, without long-term current use of insulin (HCC) 01/16/2018   Coronary artery disease involving native heart without angina pectoris 01/16/2018   DIABETES MELLITUS, TYPE II 01/13/2011    Past Surgical History:  Procedure Laterality Date   catarracts     OTHER SURGICAL HISTORY  2016   HEART STENT   TONSILLECTOMY      Current Outpatient Rx   Order #: [id number] Class: Historical Med   Order #: [id number] Class: Normal   Order #: [id number] Class: Normal   Order #: [id number] Class: Normal   Order #: [id number] Class: Normal   Order #: [id number] Class: Normal   Order #: [id number] Class: Normal   Order #: [id number] Class: Historical Med   Order #: [id number] Class: Normal    Allergies Patient has no known  allergies.  Family History  Problem Relation Age of Onset   Stroke Father    Hypertension Father    Hyperlipidemia Father    Early death Mother    Colitis Sister    Appendicitis Brother     Social History Social History   Tobacco Use   Smoking status: Never Smoker   Smokeless tobacco: Never Used  Substance Use Topics   Alcohol use: Yes    Comment: VERY RARELY   Drug use: No    Review of Systems  LEVEL V CAVEAT 2/2 HEAD INJURY ____________________________________________   PHYSICAL EXAM:  VITAL SIGNS: ED Triage Vitals  Enc Vitals Group     BP 07/14/18 0206 (!) 152/75     Pulse Rate 07/14/18 0152 76     Resp 07/14/18 0152 (!) 21     Temp 07/14/18 0152 98.8 F (37.1 C)     Temp Source 07/14/18 0152 Oral     SpO2 07/14/18 0152 99 %     Weight 07/14/18 0153 250 lb (113.4 kg)     Height 07/14/18 0153  (1.778 m)    Constitutional: Alert and oriented. Well appearing and in no acute distress. Eyes: Conjunctivae are normal. PERRL. EOMI. Head: posterior scalp laceration. Nose: No congestion/rhinnorhea. Mouth/Throat: Mucous membranes are moist.  Oropharynx non-erythematous. Neck: No stridor.  No meningeal signs.  Cardiovascular: Normal rate, regular rhythm. Good peripheral circulation. Grossly normal heart sounds.   Respiratory: Normal respiratory effort.  No retractions. Lungs CTAB. Gastrointestinal: Soft and nontender. No distention.  Musculoskeletal: No lower extremity tenderness nor edema. No gross deformities of extremities. Right chest ttp and upper thoracic ttp. Neurologic:  Normal speech and language. No gross focal neurologic deficits are appreciated.  Skin:  Skin is warm, dry and intact. No rash noted.  ____________________________________________   LABS (all labs ordered are listed, but only abnormal results are displayed)  Labs Reviewed  CBC WITH DIFFERENTIAL/PLATELET - Abnormal; Notable for the following components:      Result Value    Abs Immature Granulocytes 0.09 (*)    All other components within normal limits  COMPREHENSIVE METABOLIC PANEL - Abnormal; Notable for the following components:   Glucose, Bld 283 (*)    BUN 26 (*)    All other components within normal limits  LACTIC ACID, PLASMA - Abnormal; Notable for the following components:   Lactic Acid, Venous 3.4 (*)    All other components within normal limits  LACTIC ACID, PLASMA - Abnormal; Notable for the following components:   Lactic Acid, Venous 3.1 (*)    All other components within normal limits  URINALYSIS, ROUTINE W REFLEX MICROSCOPIC - Abnormal; Notable for the following components:   Color, Urine STRAW (*)    Glucose, UA >=500 (*)    Hgb urine dipstick MODERATE (*)    Ketones, ur 5 (*)    All other components within normal limits  CBG MONITORING, ED - Abnormal; Notable for the following components:   Glucose-Capillary 257 (*)    All other components within normal limits  RAPID URINE DRUG SCREEN, HOSP PERFORMED  ETHANOL   ____________________________________________  EKG   EKG Interpretation  Date/Time:  Monday Jul 14 2018 01:52:46 EDT Ventricular Rate:  76 PR Interval:    QRS Duration: 124 QT Interval:  416 QTC Calculation: 468 R Axis:   -29 Text Interpretation:  Sinus rhythm Nonspecific intraventricular conduction delay No significant change since last tracing Confirmed by Marily Memos [id number]) on 07/14/2018 3:36:46 AM       ____________________________________________  RADIOLOGY  Ct Head Wo Contrast  Result Date: 07/14/2018 CLINICAL DATA:  Unwitnessed fall with back and neck pain. Posterior head laceration. EXAM: CT HEAD WITHOUT CONTRAST CT CERVICAL SPINE WITHOUT CONTRAST TECHNIQUE: Multidetector CT imaging of the head and cervical spine was performed following the standard protocol without intravenous contrast. Multiplanar CT image reconstructions of the cervical spine were also generated. COMPARISON:  None. FINDINGS: CT HEAD  FINDINGS Brain: Crescentic subdural hematoma along the left cerebral convexity measuring up to 11 mm in thickness. There is associated cerebral mass effect. No measurable shift on axial slices. Patchy subarachnoid hemorrhage seen in the interpeduncular fossa, supra vermian cistern, left CP angle, and occipital/right parietal sulci. No infarct. Mild lateral ventriculomegaly attributed to volume loss. Vascular: Negative Skull: Right posterior scalp laceration and contusion without underlying calvarial fracture. Sinuses/Orbits: Bilateral cataract resection.  No acute finding Critical Value/emergent results were called by telephone at the time of interpretation on 07/14/2018 at 4:21 am to Dr. Marily Memos , who verbally acknowledged these results. CT CERVICAL SPINE FINDINGS Alignment: Normal Skull base and vertebrae: Negative for fracture. Dysmorphic upper cervical posterior elements, incidental. Soft tissues and spinal canal: No prevertebral fluid or swelling. No visible canal hematoma. A contusion is partially covered superficial to the right scapula, reference chest CT. Disc levels:  No evidence of degenerative impingement  Upper chest: Reported separately IMPRESSION: 1. Acute subdural hematoma along the left cerebral convexity measuring up to 11 mm in thickness. Mild cerebral mass effect. 2. Patchy subarachnoid hemorrhage with a traumatic pattern. 3. Negative for cervical spine fracture. Electronically Signed   By: Marnee Spring M.D.   On: 07/14/2018 04:24   Ct Chest W Contrast  Result Date: 07/14/2018 CLINICAL DATA:  Unwitnessed fall with back pain EXAM: CT CHEST, ABDOMEN, AND PELVIS WITH CONTRAST TECHNIQUE: Multidetector CT imaging of the chest, abdomen and pelvis was performed following the standard protocol during bolus administration of intravenous contrast. CONTRAST:  OMNIPAQUE IOHEXOL 300 MG/ML  SOLN COMPARISON:  None. FINDINGS: CT CHEST FINDINGS Cardiovascular: Normal heart size. No pericardial  effusion. Left and right coronary circulation atherosclerotic calcification. Aortic calcification. Mediastinum/Nodes: Negative for pneumomediastinum or hematoma. Lungs/Pleura: Mild subpleural hemorrhage in the posterior right chest associated with rib fractures. No hemothorax, lung contusion, or pneumothorax. There are scattered calcified pulmonary nodules consistent with remote granulomatous disease. Musculoskeletal: Posterior right sixth and seventh rib fractures. The seventh rib fracture is displaced. The cartilage of the right seventh rib is also fractured anteriorly. A subcutaneous contusion is seen over the posterior and right-sided chest wall. CT ABDOMEN PELVIS FINDINGS Hepatobiliary: No hepatic injury or perihepatic hematoma. Gallbladder is unremarkable Pancreas: Negative Spleen: Negative Adrenals/Urinary Tract: No adrenal hemorrhage or renal injury identified. Bladder is unremarkable. Stomach/Bowel: No evidence of injury. Colonic diverticulosis that is generalized Vascular/Lymphatic: Atherosclerotic calcification Reproductive: Large partially covered hydrocele on the left. Hydrocele is also seen on the right, extending into the inguinal canal. Other: No ascites or pneumoperitoneum Musculoskeletal: Subcutaneous contusion involving the right more than left posterior flank. No associated fracture. IMPRESSION: 1. Posterior right sixth and seventh rib fractures with seventh rib fracture displacement. There is also seventh rib cartilage fracture anteriorly. 2. Soft tissue contusions of the back. 3. No evidence of intra-abdominal injury. Electronically Signed   By: Marnee Spring M.D.   On: 07/14/2018 04:36   Ct Cervical Spine Wo Contrast  Result Date: 07/14/2018 CLINICAL DATA:  Unwitnessed fall with back and neck pain. Posterior head laceration. EXAM: CT HEAD WITHOUT CONTRAST CT CERVICAL SPINE WITHOUT CONTRAST TECHNIQUE: Multidetector CT imaging of the head and cervical spine was performed following the  standard protocol without intravenous contrast. Multiplanar CT image reconstructions of the cervical spine were also generated. COMPARISON:  None. FINDINGS: CT HEAD FINDINGS Brain: Crescentic subdural hematoma along the left cerebral convexity measuring up to 11 mm in thickness. There is associated cerebral mass effect. No measurable shift on axial slices. Patchy subarachnoid hemorrhage seen in the interpeduncular fossa, supra vermian cistern, left CP angle, and occipital/right parietal sulci. No infarct. Mild lateral ventriculomegaly attributed to volume loss. Vascular: Negative Skull: Right posterior scalp laceration and contusion without underlying calvarial fracture. Sinuses/Orbits: Bilateral cataract resection.  No acute finding Critical Value/emergent results were called by telephone at the time of interpretation on 07/14/2018 at 4:21 am to Dr. Marily Memos , who verbally acknowledged these results. CT CERVICAL SPINE FINDINGS Alignment: Normal Skull base and vertebrae: Negative for fracture. Dysmorphic upper cervical posterior elements, incidental. Soft tissues and spinal canal: No prevertebral fluid or swelling. No visible canal hematoma. A contusion is partially covered superficial to the right scapula, reference chest CT. Disc levels:  No evidence of degenerative impingement Upper chest: Reported separately IMPRESSION: 1. Acute subdural hematoma along the left cerebral convexity measuring up to 11 mm in thickness. Mild cerebral mass effect. 2. Patchy subarachnoid hemorrhage with a traumatic pattern.  3. Negative for cervical spine fracture. Electronically Signed   By: Marnee SpringJonathon  Watts M.D.   On: 07/14/2018 04:24   Ct Abdomen Pelvis W Contrast  Result Date: 07/14/2018 CLINICAL DATA:  Unwitnessed fall with back pain EXAM: CT CHEST, ABDOMEN, AND PELVIS WITH CONTRAST TECHNIQUE: Multidetector CT imaging of the chest, abdomen and pelvis was performed following the standard protocol during bolus administration of  intravenous contrast. CONTRAST:  125mL OMNIPAQUE IOHEXOL 300 MG/ML  SOLN COMPARISON:  None. FINDINGS: CT CHEST FINDINGS Cardiovascular: Normal heart size. No pericardial effusion. Left and right coronary circulation atherosclerotic calcification. Aortic calcification. Mediastinum/Nodes: Negative for pneumomediastinum or hematoma. Lungs/Pleura: Mild subpleural hemorrhage in the posterior right chest associated with rib fractures. No hemothorax, lung contusion, or pneumothorax. There are scattered calcified pulmonary nodules consistent with remote granulomatous disease. Musculoskeletal: Posterior right sixth and seventh rib fractures. The seventh rib fracture is displaced. The cartilage of the right seventh rib is also fractured anteriorly. A subcutaneous contusion is seen over the posterior and right-sided chest wall. CT ABDOMEN PELVIS FINDINGS Hepatobiliary: No hepatic injury or perihepatic hematoma. Gallbladder is unremarkable Pancreas: Negative Spleen: Negative Adrenals/Urinary Tract: No adrenal hemorrhage or renal injury identified. Bladder is unremarkable. Stomach/Bowel: No evidence of injury. Colonic diverticulosis that is generalized Vascular/Lymphatic: Atherosclerotic calcification Reproductive: Large partially covered hydrocele on the left. Hydrocele is also seen on the right, extending into the inguinal canal. Other: No ascites or pneumoperitoneum Musculoskeletal: Subcutaneous contusion involving the right more than left posterior flank. No associated fracture. IMPRESSION: 1. Posterior right sixth and seventh rib fractures with seventh rib fracture displacement. There is also seventh rib cartilage fracture anteriorly. 2. Soft tissue contusions of the back. 3. No evidence of intra-abdominal injury. Electronically Signed   By: Marnee SpringJonathon  Watts M.D.   On: 07/14/2018 04:36   Dg Pelvis Portable  Result Date: 07/14/2018 CLINICAL DATA:  Unwitnessed fall EXAM: PORTABLE PELVIS 1-2 VIEWS COMPARISON:  None.  FINDINGS: There is no evidence of pelvic fracture or diastasis. No pelvic bone lesions are seen. IMPRESSION: Negative. Electronically Signed   By: Charlett NoseKevin  Dover M.D.   On: 07/14/2018 02:14   Dg Chest Portable 1 View  Result Date: 07/14/2018 CLINICAL DATA:  Unwitnessed fall.  Back pain EXAM: PORTABLE CHEST 1 VIEW COMPARISON:  12/02/2006 FINDINGS: Low lung volumes, bibasilar atelectasis. Heart is borderline in size. No visible effusions or pneumothorax. Posterior right 7th rib fracture. IMPRESSION: Low lung volumes, bibasilar atelectasis. Posterior right 7th rib fracture.  No pneumothorax. Electronically Signed   By: Charlett NoseKevin  Dover M.D.   On: 07/14/2018 02:15    ____________________________________________   PROCEDURES  Procedure(s) performed:   Marland Kitchen.Marland Kitchen.Laceration Repair Date/Time: 07/14/2018 5:44 AM Performed by: Marily MemosMesner, Shavaughn Seidl, MD Authorized by: Marily MemosMesner, Chelby Salata, MD   Consent:    Consent obtained:  Verbal   Consent given by:  Patient   Risks discussed:  Infection, need for additional repair, nerve damage, poor wound healing, poor cosmetic result, pain, retained foreign body, tendon damage and vascular damage   Alternatives discussed:  No treatment, delayed treatment and observation Anesthesia (see MAR for exact dosages):    Anesthesia method:  Local infiltration   Local anesthetic:  Lidocaine 1% w/o epi Laceration details:    Location:  Scalp   Scalp location:  Occipital   Length (cm):  3   Depth (mm):  4 Repair type:    Repair type:  Simple Pre-procedure details:    Preparation:  Patient was prepped and draped in usual sterile fashion and imaging obtained to evaluate for  foreign bodies Exploration:    Hemostasis achieved with:  Direct pressure   Wound exploration: wound explored through full range of motion and entire depth of wound probed and visualized     Contaminated: no   Treatment:    Area cleansed with:  Saline   Amount of cleaning:  Extensive   Irrigation solution:  Sterile  water   Irrigation volume:  500 ml Skin repair:    Repair method:  Staples   Number of staples:  8 Approximation:    Approximation:  Close Post-procedure details:    Dressing:  Open (no dressing)   Patient tolerance of procedure:  Tolerated well, no immediate complications .Critical Care Performed by: Marily Memos, MD Authorized by: Marily Memos, MD   Critical care provider statement:    Critical care time (minutes):  45   Critical care was time spent personally by me on the following activities:  Discussions with consultants, evaluation of patient's response to treatment, examination of patient, ordering and performing treatments and interventions, ordering and review of laboratory studies, ordering and review of radiographic studies, pulse oximetry, re-evaluation of patient's condition, obtaining history from patient or surrogate and review of old charts     ____________________________________________   INITIAL IMPRESSION / ASSESSMENT AND PLAN / ED COURSE  Concussed, will repair laceration, update TDAP. Full ct scans 2/2 significant ttp in spine/ribs and patient not reliable.   Wound repaired as above after extensive cleaning and hematoma evacuation.  Reviewed head CT and noticed likely subarachnoid and after discussion with radiologist will also notes a subdural hematoma.  Patient still altered with severe headache so I discussed with neurosurgery, Dr. Jordan Likes, who will see the patient but at this time no neurosurgical intervention or further recommendations.  Recommended ICU admission to trauma.  Discussed with trauma surgery, Dr. Corliss Skains, who will see patient and admit.  Pertinent labs & imaging results that were available during my care of the patient were reviewed by me and considered in my medical decision making (see chart for details).   ____________________________________________  FINAL CLINICAL IMPRESSION(S) / ED DIAGNOSES  Final diagnoses:  SAH (subarachnoid  hemorrhage) (HCC)  Subdural hematoma (HCC)  Closed fracture of multiple ribs of right side, initial encounter     MEDICATIONS GIVEN DURING THIS VISIT:  Medications  lactated ringers bolus 2,000 mL (2,000 mLs Intravenous New Bag/Given 07/14/18 0408)  lidocaine-EPINEPHrine (XYLOCAINE W/EPI) 2 %-1:100000 (with pres) injection 20 mL (20 mLs Infiltration Given 07/14/18 0407)  iohexol (OMNIPAQUE) 300 MG/ML solution 125 mL (125 mLs Intravenous Contrast Given 07/14/18 0409)  fentaNYL (SUBLIMAZE) injection 100 mcg (100 mcg Intravenous Given 07/14/18 0506)     NEW OUTPATIENT MEDICATIONS STARTED DURING THIS VISIT:  New Prescriptions   No medications on file    Note:  This note was prepared with assistance of Dragon voice recognition software. Occasional wrong-word or sound-a-like substitutions may have occurred due to the inherent limitations of voice recognition software.   Falon Huesca, Barbara Cower, MD 07/14/18 [phone]

## 2018-07-14 NOTE — H&P (Signed)
History   [name] Frank is an 68 y.o. male.   Chief Complaint:  Chief Complaint  Patient presents with  . Fall    HPI 68 year old male on Plavix presents after an unwitnessed fall at home, possibly down a few steps.  He was found by his wife.  He was brought in by EMS with a laceration on the back of his scalp.  Hemodynamically stable throughout.  Questionable LOC.  He complains of some back pain.  He was found to have a scalp laceration, subdural hematoma with scattered subarachnoid hemorrhage, and two rib fractures.  Past Medical History:  Diagnosis Date  . Diabetes mellitus   . Heart disease   . Hyperlipemia   . Hypertension     Past Surgical History:  Procedure Laterality Date  . catarracts    . OTHER SURGICAL HISTORY  2016   HEART STENT  . TONSILLECTOMY      Family History  Problem Relation Age of Onset  . Stroke Father   . Hypertension Father   . Hyperlipidemia Father   . Early death Mother   . Colitis Sister   . Appendicitis Brother    Social History:  reports that he has never smoked. He has never used smokeless tobacco. He reports current alcohol use. He reports that he does not use drugs.  Allergies  No Known Allergies  Home Medications   Prior to Admission medications   Medication Sig Start Date End Date Taking? Authorizing Provider  aspirin 81 MG chewable tablet Chew 81 mg by mouth daily.      [provider]  atorvastatin (LIPITOR) 80 MG tablet Take 1 tablet (80 mg total) by mouth daily. 01/16/18   Copland, Gwenlyn Found, MD  clopidogrel (PLAVIX) 75 MG tablet Take 1 tablet (75 mg total) by mouth daily. 01/16/18   Copland, Gwenlyn Found, MD  glipiZIDE (GLUCOTROL XL) 10 MG 24 hr tablet Take 1 tablet (10 mg total) by mouth 2 (two) times daily. 01/16/18   Copland, Gwenlyn Found, MD  INVOKAMET [id number] MG TABS Take 1 tablet by mouth 2 (two) times daily. 01/16/18   Copland, Gwenlyn Found, MD  losartan (COZAAR) 50 MG tablet Take 1 tablet (50 mg total) by mouth daily.  01/16/18   Copland, Gwenlyn Found, MD  metoprolol succinate (TOPROL-XL) 25 MG 24 hr tablet Take 1 tablet (25 mg total) by mouth daily. 01/16/18   Copland, Gwenlyn Found, MD  metoprolol tartrate (LOPRESSOR) 25 MG tablet Take 12.5 mg by mouth daily.    [provider]  neomycin-polymyxin-hydrocortisone (CORTISPORIN) OTIC solution Apply 1-2 drops to toe after soaking BID 01/27/18   Lenn Sink, DPM     Trauma Course   Results for orders placed or performed during the hospital encounter of 07/14/18 (from the past 48 hour(s))  CBC with Differential     Status: Abnormal   Collection Time: 07/14/18  1:53 AM  Result Value Ref Range   WBC 9.4 4.0 - 10.5 K/uL   RBC 5.37 4.22 - 5.81 MIL/uL   Hemoglobin 15.7 13.0 - 17.0 g/dL   HCT 16.1 09.6 - 04.5 %   MCV 89.0 80.0 - 100.0 fL   MCH 29.2 26.0 - 34.0 pg   MCHC 32.8 30.0 - 36.0 g/dL   RDW 40.9 81.1 - 91.4 %   Platelets 189 150 - 400 K/uL   nRBC 0.0 0.0 - 0.2 %   Neutrophils Relative % 70 %   Neutro Abs 6.7 1.7 - 7.7  K/uL   Lymphocytes Relative 20 %   Lymphs Abs 1.9 0.7 - 4.0 K/uL   Monocytes Relative 7 %   Monocytes Absolute 0.7 0.1 - 1.0 K/uL   Eosinophils Relative 1 %   Eosinophils Absolute 0.1 0.0 - 0.5 K/uL   Basophils Relative 1 %   Basophils Absolute 0.1 0.0 - 0.1 K/uL   Immature Granulocytes 1 %   Abs Immature Granulocytes 0.09 (H) 0.00 - 0.07 K/uL    Comment: Performed at [facility], [address] [place], [place] [address]  Comprehensive metabolic panel     Status: Abnormal   Collection Time: 07/14/18  1:53 AM  Result Value Ref Range   Sodium 137 135 - 145 mmol/L   Potassium 4.0 3.5 - 5.1 mmol/L   Chloride 104 98 - 111 mmol/L   CO2 22 22 - 32 mmol/L   Glucose, Bld 283 (H) 70 - 99 mg/dL   BUN 26 (H) 8 - 23 mg/dL   Creatinine, Ser 1.911.13 0.61 - 1.24 mg/dL   Calcium 9.0 8.9 - 47.810.3 mg/dL   Total Protein 6.5 6.5 - 8.1 g/dL   Albumin 4.1 3.5 - 5.0 g/dL   AST 36 15 - 41 U/L   ALT 41 0 - 44 U/L   Alkaline Phosphatase  48 38 - 126 U/L   Total Bilirubin 0.8 0.3 - 1.2 mg/dL   GFR calc non Af Amer >60 >60 mL/min   GFR calc Af Amer >60 >60 mL/min   Anion gap 11 5 - 15    Comment: Performed at [facility], [address] [place], [place] [address]  Lactic acid, plasma     Status: Abnormal   Collection Time: 07/14/18  1:53 AM  Result Value Ref Range   Lactic Acid, Venous 3.4 (HH) 0.5 - 1.9 mmol/L    Comment: CRITICAL RESULT CALLED TO, READ BACK BY AND VERIFIED WITH: TOBIAS M,RN 07/14/18 0258 WAYK Performed at [facility], [address] [place], [place] [address]   Ethanol     Status: None   Collection Time: 07/14/18  1:55 AM  Result Value Ref Range   Alcohol, Ethyl (B) <10 <10 mg/dL    Comment: (NOTE) Lowest detectable limit for serum alcohol is 10 mg/dL. For medical purposes only. Performed at [facility], [address] [place], [place] [address]   CBG monitoring, ED     Status: Abnormal   Collection Time: 07/14/18  2:34 AM  Result Value Ref Range   Glucose-Capillary 257 (H) 70 - 99 mg/dL  Rapid urine drug screen (hospital performed)     Status: None   Collection Time: 07/14/18  3:19 AM  Result Value Ref Range   Opiates NONE DETECTED NONE DETECTED   Cocaine NONE DETECTED NONE DETECTED   Benzodiazepines NONE DETECTED NONE DETECTED   Amphetamines NONE DETECTED NONE DETECTED   Tetrahydrocannabinol NONE DETECTED NONE DETECTED   Barbiturates NONE DETECTED NONE DETECTED    Comment: (NOTE) DRUG SCREEN FOR MEDICAL PURPOSES ONLY.  IF CONFIRMATION IS NEEDED FOR ANY PURPOSE, NOTIFY LAB WITHIN 5 DAYS. LOWEST DETECTABLE LIMITS FOR URINE DRUG SCREEN Drug Class                     Cutoff (ng/mL) Amphetamine and metabolites    1000 Barbiturate and metabolites    200 Benzodiazepine                 200 Tricyclics and metabolites  300 Opiates and metabolites        300 Cocaine and metabolites        300 THC                            50 Performed at [facility],  [name], [place], [place] [address]   Urinalysis, Routine w reflex microscopic     Status: Abnormal   Collection Time: 07/14/18  3:19 AM  Result Value Ref Range   Color, Urine STRAW (A) YELLOW   APPearance CLEAR CLEAR   Specific Gravity, Urine 1.030 1.005 - 1.030   pH 5.0 5.0 - 8.0   Glucose, UA >=500 (A) NEGATIVE mg/dL   Hgb urine dipstick MODERATE (A) NEGATIVE   Bilirubin Urine NEGATIVE NEGATIVE   Ketones, ur 5 (A) NEGATIVE mg/dL   Protein, ur NEGATIVE NEGATIVE mg/dL   Nitrite NEGATIVE NEGATIVE   Leukocytes,Ua NEGATIVE NEGATIVE   WBC, UA 0-5 0 - 5 WBC/hpf   Bacteria, UA NONE SEEN NONE SEEN    Comment: Performed at [facility], [address], [place], [place] [address]  Lactic acid, plasma     Status: Abnormal   Collection Time: 07/14/18  3:28 AM  Result Value Ref Range   Lactic Acid, Venous 3.1 (HH) 0.5 - 1.9 mmol/L    Comment: CRITICAL RESULT CALLED TO, READ BACK BY AND VERIFIED WITH: SANDERS A,RN 07/14/18 0403 WAYK Performed at [facility], [address], [place], [place] [address]    Ct Head Wo Contrast  Result Date: 07/14/2018 CLINICAL DATA:  Unwitnessed fall with back and neck pain. Posterior head laceration. EXAM: CT HEAD WITHOUT CONTRAST CT CERVICAL SPINE WITHOUT CONTRAST TECHNIQUE: Multidetector CT imaging of the head and cervical spine was performed following the standard protocol without intravenous contrast. Multiplanar CT image reconstructions of the cervical spine were also generated. COMPARISON:  None. FINDINGS: CT HEAD FINDINGS Brain: Crescentic subdural hematoma along the left cerebral convexity measuring up to 11 mm in thickness. There is associated cerebral mass effect. No measurable shift on axial slices. Patchy subarachnoid hemorrhage seen in the interpeduncular fossa, supra vermian cistern, left CP angle, and occipital/right parietal sulci. No infarct. Mild lateral ventriculomegaly attributed to volume loss. Vascular: Negative Skull: Right  posterior scalp laceration and contusion without underlying calvarial fracture. Sinuses/Orbits: Bilateral cataract resection.  No acute finding Critical Value/emergent results were called by telephone at the time of interpretation on 07/14/2018 at 4:21 am to Dr. Marily Memos , who verbally acknowledged these results. CT CERVICAL SPINE FINDINGS Alignment: Normal Skull base and vertebrae: Negative for fracture. Dysmorphic upper cervical posterior elements, incidental. Soft tissues and spinal canal: No prevertebral fluid or swelling. No visible canal hematoma. A contusion is partially covered superficial to the right scapula, reference chest CT. Disc levels:  No evidence of degenerative impingement Upper chest: Reported separately IMPRESSION: 1. Acute subdural hematoma along the left cerebral convexity measuring up to 11 mm in thickness. Mild cerebral mass effect. 2. Patchy subarachnoid hemorrhage with a traumatic pattern. 3. Negative for cervical spine fracture. Electronically Signed   By: Marnee Spring M.D.   On: 07/14/2018 04:24   Ct Chest W Contrast  Result Date: 07/14/2018 CLINICAL DATA:  Unwitnessed fall with back pain EXAM: CT CHEST, ABDOMEN, AND PELVIS WITH CONTRAST TECHNIQUE: Multidetector CT imaging of the chest, abdomen and pelvis was performed following the standard protocol during bolus administration of intravenous contrast. CONTRAST:   OMNIPAQUE IOHEXOL 300 MG/ML  SOLN COMPARISON:  None. FINDINGS: CT CHEST FINDINGS Cardiovascular: Normal heart size. No pericardial effusion. Left and right coronary circulation atherosclerotic calcification. Aortic calcification. Mediastinum/Nodes: Negative for pneumomediastinum or hematoma. Lungs/Pleura: Mild subpleural hemorrhage in the posterior right chest associated with rib fractures. No hemothorax, lung contusion, or pneumothorax. There are scattered calcified pulmonary nodules consistent with remote granulomatous disease. Musculoskeletal: Posterior right  sixth and seventh rib fractures. The seventh rib fracture is displaced. The cartilage of the right seventh rib is also fractured anteriorly. A subcutaneous contusion is seen over the posterior and right-sided chest wall. CT ABDOMEN PELVIS FINDINGS Hepatobiliary: No hepatic injury or perihepatic hematoma. Gallbladder is unremarkable Pancreas: Negative Spleen: Negative Adrenals/Urinary Tract: No adrenal hemorrhage or renal injury identified. Bladder is unremarkable. Stomach/Bowel: No evidence of injury. Colonic diverticulosis that is generalized Vascular/Lymphatic: Atherosclerotic calcification Reproductive: Large partially covered hydrocele on the left. Hydrocele is also seen on the right, extending into the inguinal canal. Other: No ascites or pneumoperitoneum Musculoskeletal: Subcutaneous contusion involving the right more than left posterior flank. No associated fracture. IMPRESSION: 1. Posterior right sixth and seventh rib fractures with seventh rib fracture displacement. There is also seventh rib cartilage fracture anteriorly. 2. Soft tissue contusions of the back. 3. No evidence of intra-abdominal injury. Electronically Signed   By: Marnee Spring M.D.   On: 07/14/2018 04:36   Ct Cervical Spine Wo Contrast  Result Date: 07/14/2018 CLINICAL DATA:  Unwitnessed fall with back and neck pain. Posterior head laceration. EXAM: CT HEAD WITHOUT CONTRAST CT CERVICAL SPINE WITHOUT CONTRAST TECHNIQUE: Multidetector CT imaging of the head and cervical spine was performed following the standard protocol without intravenous contrast. Multiplanar CT image reconstructions of the cervical spine were also generated. COMPARISON:  None. FINDINGS: CT HEAD FINDINGS Brain: Crescentic subdural hematoma along the left cerebral convexity measuring up to 11 mm in thickness. There is associated cerebral mass effect. No measurable shift on axial slices. Patchy subarachnoid hemorrhage seen in the interpeduncular fossa, supra vermian  cistern, left CP angle, and occipital/right parietal sulci. No infarct. Mild lateral ventriculomegaly attributed to volume loss. Vascular: Negative Skull: Right posterior scalp laceration and contusion without underlying calvarial fracture. Sinuses/Orbits: Bilateral cataract resection.  No acute finding Critical Value/emergent results were called by telephone at the time of interpretation on 07/14/2018 at 4:21 am to Dr. Marily Memos , who verbally acknowledged these results. CT CERVICAL SPINE FINDINGS Alignment: Normal Skull base and vertebrae: Negative for fracture. Dysmorphic upper cervical posterior elements, incidental. Soft tissues and spinal canal: No prevertebral fluid or swelling. No visible canal hematoma. A contusion is partially covered superficial to the right scapula, reference chest CT. Disc levels:  No evidence of degenerative impingement Upper chest: Reported separately IMPRESSION: 1. Acute subdural hematoma along the left cerebral convexity measuring up to 11 mm in thickness. Mild cerebral mass effect. 2. Patchy subarachnoid hemorrhage with a traumatic pattern. 3. Negative for cervical spine fracture. Electronically Signed   By: Marnee Spring M.D.   On: 07/14/2018 04:24   Ct Abdomen Pelvis W Contrast  Result Date: 07/14/2018 CLINICAL DATA:  Unwitnessed fall with back pain EXAM: CT CHEST, ABDOMEN, AND PELVIS WITH CONTRAST TECHNIQUE: Multidetector CT imaging of the chest, abdomen and pelvis was performed following the standard protocol during bolus administration of intravenous contrast. CONTRAST:  OMNIPAQUE IOHEXOL 300 MG/ML  SOLN COMPARISON:  None. FINDINGS: CT CHEST FINDINGS Cardiovascular: Normal heart size. No pericardial effusion. Left and right coronary circulation atherosclerotic calcification. Aortic calcification. Mediastinum/Nodes: Negative for  pneumomediastinum or hematoma. Lungs/Pleura: Mild subpleural hemorrhage in the posterior right chest associated with rib fractures. No  hemothorax, lung contusion, or pneumothorax. There are scattered calcified pulmonary nodules consistent with remote granulomatous disease. Musculoskeletal: Posterior right sixth and seventh rib fractures. The seventh rib fracture is displaced. The cartilage of the right seventh rib is also fractured anteriorly. A subcutaneous contusion is seen over the posterior and right-sided chest wall. CT ABDOMEN PELVIS FINDINGS Hepatobiliary: No hepatic injury or perihepatic hematoma. Gallbladder is unremarkable Pancreas: Negative Spleen: Negative Adrenals/Urinary Tract: No adrenal hemorrhage or renal injury identified. Bladder is unremarkable. Stomach/Bowel: No evidence of injury. Colonic diverticulosis that is generalized Vascular/Lymphatic: Atherosclerotic calcification Reproductive: Large partially covered hydrocele on the left. Hydrocele is also seen on the right, extending into the inguinal canal. Other: No ascites or pneumoperitoneum Musculoskeletal: Subcutaneous contusion involving the right more than left posterior flank. No associated fracture. IMPRESSION: 1. Posterior right sixth and seventh rib fractures with seventh rib fracture displacement. There is also seventh rib cartilage fracture anteriorly. 2. Soft tissue contusions of the back. 3. No evidence of intra-abdominal injury. Electronically Signed   By: Marnee Spring M.D.   On: 07/14/2018 04:36   Dg Pelvis Portable  Result Date: 07/14/2018 CLINICAL DATA:  Unwitnessed fall EXAM: PORTABLE PELVIS 1-2 VIEWS COMPARISON:  None. FINDINGS: There is no evidence of pelvic fracture or diastasis. No pelvic bone lesions are seen. IMPRESSION: Negative. Electronically Signed   By: Charlett Nose M.D.   On: 07/14/2018 02:14   Dg Chest Portable 1 View  Result Date: 07/14/2018 CLINICAL DATA:  Unwitnessed fall.  Back pain EXAM: PORTABLE CHEST 1 VIEW COMPARISON:  12/02/2006 FINDINGS: Low lung volumes, bibasilar atelectasis. Heart is borderline in size. No visible effusions or  pneumothorax. Posterior right 7th rib fracture. IMPRESSION: Low lung volumes, bibasilar atelectasis. Posterior right 7th rib fracture.  No pneumothorax. Electronically Signed   By: Charlett Nose M.D.   On: 07/14/2018 02:15    Review of Systems  Reason unable to perform ROS: altered mental status.    Blood pressure (!) 158/78, pulse 81, temperature 98.8 F (37.1 C), temperature source Oral, resp. rate (!) 24, height  (1.778 m), weight 113.4 kg, SpO2 94 %. Physical Exam  Vitals reviewed. Constitutional: He appears well-developed and well-nourished. He is cooperative. No distress. Cervical collar and nasal cannula in place.  HENT:  Head: Normocephalic. Head is without raccoon's eyes, without Battle's sign, without abrasion, without contusion and without laceration.  Right Ear: Hearing, tympanic membrane, external ear and ear canal normal. No lacerations. No drainage or tenderness. No foreign bodies. Tympanic membrane is not perforated. No hemotympanum.  Left Ear: Hearing, tympanic membrane, external ear and ear canal normal. No lacerations. No drainage or tenderness. No foreign bodies. Tympanic membrane is not perforated. No hemotympanum.  Nose: Nose normal. No nose lacerations, sinus tenderness, nasal deformity or nasal septal hematoma. No epistaxis.  Mouth/Throat: Uvula is midline, oropharynx is clear and moist and mucous membranes are normal. No lacerations.  Posterior scalp laceration - stapled  Eyes: Pupils are equal, round, and reactive to light. Conjunctivae, EOM and lids are normal. No scleral icterus.  Neck: Trachea normal and normal range of motion. No JVD present. No spinous process tenderness and no muscular tenderness present. Carotid bruit is not present. No thyromegaly present.  Cardiovascular: Normal rate, regular rhythm, normal heart sounds, intact distal pulses and normal pulses.  Respiratory: Effort normal and breath sounds normal. No respiratory distress. He exhibits  tenderness (Right posterior). He  exhibits no bony tenderness, no laceration and no crepitus.  GI: Soft. Normal appearance. He exhibits no distension. Bowel sounds are decreased. There is no abdominal tenderness. There is no rigidity, no rebound, no guarding and no CVA tenderness.  Musculoskeletal: Normal range of motion.        General: No tenderness or edema.  Lymphadenopathy:    He has no cervical adenopathy.  Neurological: He is alert. He has normal strength. No cranial nerve deficit or sensory deficit. GCS eye subscore is 4. GCS verbal subscore is 5. GCS motor subscore is 6.  Intermittently disoriented to place and time.  Oriented to person.  Does not remember fall.  Skin: Skin is warm, dry and intact. He is not diaphoretic.  Psychiatric: He has a normal mood and affect. His speech is normal and behavior is normal.     Assessment/Plan Fall - on anticoagulation  1.  Subdural hematoma - left 11mm 2.  Patchy subarachnoid hemorrhage 3.  Posterior right sixth and seventh rib fractures with some displacement  Neurosurgery - Pool  Admit to Trauma ICU NPO for now - SSI Hold anticoagulation  Wynona Luna 07/14/2018, 6:04 AM   Procedures

## 2018-07-14 NOTE — ED Triage Notes (Addendum)
Per ems pt was at home and had an unwitnessed fall. Pt is cpmplaining of back and neck pain upon ems arrival. Pt has laceration to back of head but was not able to access bc of pain in back.  CBG 251, 80 P, 123/84 14 GCS, 98 RA,

## 2018-07-14 NOTE — Consult Note (Signed)
Reason for Consult: Subdural hematoma Referring Physician: Emergency department  [name] Frank is an 68 y.o. male.  HPI: 68 year old male status post fall from standing height.  Patient with complaints of headache and chest pain.  Unknown loss of consciousness.  Patient has remained confused and amnestic to the events surrounding his fall.  Patient currently complains of headache.  No complaints of numbness paresthesias or weakness.  No seizure.  Past Medical History:  Diagnosis Date  . Diabetes mellitus   . Heart disease   . Hyperlipemia   . Hypertension     Past Surgical History:  Procedure Laterality Date  . catarracts    . OTHER SURGICAL HISTORY  2016   HEART STENT  . TONSILLECTOMY      Family History  Problem Relation Age of Onset  . Stroke Father   . Hypertension Father   . Hyperlipidemia Father   . Early death Mother   . Colitis Sister   . Appendicitis Brother     Social History:  reports that he has never smoked. He has never used smokeless tobacco. He reports current alcohol use. He reports that he does not use drugs.  Allergies: No Known Allergies  Medications: I have reviewed the patient's current medications.  Results for orders placed or performed during the hospital encounter of 07/14/18 (from the past 48 hour(s))  CBC with Differential     Status: Abnormal   Collection Time: 07/14/18  1:53 AM  Result Value Ref Range   WBC 9.4 4.0 - 10.5 K/uL   RBC 5.37 4.22 - 5.81 MIL/uL   Hemoglobin 15.7 13.0 - 17.0 g/dL   HCT 16.1 09.6 - 04.5 %   MCV 89.0 80.0 - 100.0 fL   MCH 29.2 26.0 - 34.0 pg   MCHC 32.8 30.0 - 36.0 g/dL   RDW 40.9 81.1 - 91.4 %   Platelets 189 150 - 400 K/uL   nRBC 0.0 0.0 - 0.2 %   Neutrophils Relative % 70 %   Neutro Abs 6.7 1.7 - 7.7 K/uL   Lymphocytes Relative 20 %   Lymphs Abs 1.9 0.7 - 4.0 K/uL   Monocytes Relative 7 %   Monocytes Absolute 0.7 0.1 - 1.0 K/uL   Eosinophils Relative 1 %   Eosinophils Absolute 0.1 0.0 - 0.5 K/uL    Basophils Relative 1 %   Basophils Absolute 0.1 0.0 - 0.1 K/uL   Immature Granulocytes 1 %   Abs Immature Granulocytes 0.09 (H) 0.00 - 0.07 K/uL    Comment: Performed at [facility], [address], [place], [place] [address]  Comprehensive metabolic panel     Status: Abnormal   Collection Time: 07/14/18  1:53 AM  Result Value Ref Range   Sodium 137 135 - 145 mmol/L   Potassium 4.0 3.5 - 5.1 mmol/L   Chloride 104 98 - 111 mmol/L   CO2 22 22 - 32 mmol/L   Glucose, Bld 283 (H) 70 - 99 mg/dL   BUN 26 (H) 8 - 23 mg/dL   Creatinine, Ser 6.21 0.61 - 1.24 mg/dL   Calcium 9.0 8.9 - 30.8 mg/dL   Total Protein 6.5 6.5 - 8.1 g/dL   Albumin 4.1 3.5 - 5.0 g/dL   AST 36 15 - 41 U/L   ALT 41 0 - 44 U/L   Alkaline Phosphatase 48 38 - 126 U/L   Total Bilirubin 0.8 0.3 - 1.2 mg/dL   GFR calc non Af Amer >60 >60 mL/min  GFR calc Af Amer >60 >60 mL/min   Anion gap 11 5 - 15    Comment: Performed at [facility], [address], [place], [place] [address]  Lactic acid, plasma     Status: Abnormal   Collection Time: 07/14/18  1:53 AM  Result Value Ref Range   Lactic Acid, Venous 3.4 (HH) 0.5 - 1.9 mmol/L    Comment: CRITICAL RESULT CALLED TO, READ BACK BY AND VERIFIED WITH: TOBIAS M,RN 07/14/18 0258 WAYK Performed at [facility], [address], [place], [place] [address]   Ethanol     Status: None   Collection Time: 07/14/18  1:55 AM  Result Value Ref Range   Alcohol, Ethyl (B) <10 <10 mg/dL    Comment: (NOTE) Lowest detectable limit for serum alcohol is 10 mg/dL. For medical purposes only. Performed at [facility], [address], [place], [place] [address]   CBG monitoring, ED     Status: Abnormal   Collection Time: 07/14/18  2:34 AM  Result Value Ref Range   Glucose-Capillary 257 (H) 70 - 99 mg/dL  Rapid urine drug screen (hospital performed)     Status: None   Collection Time: 07/14/18  3:19 AM  Result Value Ref Range   Opiates NONE DETECTED NONE DETECTED    Cocaine NONE DETECTED NONE DETECTED   Benzodiazepines NONE DETECTED NONE DETECTED   Amphetamines NONE DETECTED NONE DETECTED   Tetrahydrocannabinol NONE DETECTED NONE DETECTED   Barbiturates NONE DETECTED NONE DETECTED    Comment: (NOTE) DRUG SCREEN FOR MEDICAL PURPOSES ONLY.  IF CONFIRMATION IS NEEDED FOR ANY PURPOSE, NOTIFY LAB WITHIN 5 DAYS. LOWEST DETECTABLE LIMITS FOR URINE DRUG SCREEN Drug Class                     Cutoff (ng/mL) Amphetamine and metabolites    1000 Barbiturate and metabolites    200 Benzodiazepine                 200 Tricyclics and metabolites     300 Opiates and metabolites        300 Cocaine and metabolites        300 THC                            50 Performed at [facility], [address] [place], [place] [address]   Urinalysis, Routine w reflex microscopic     Status: Abnormal   Collection Time: 07/14/18  3:19 AM  Result Value Ref Range   Color, Urine STRAW (A) YELLOW   APPearance CLEAR CLEAR   Specific Gravity, Urine 1.030 1.005 - 1.030   pH 5.0 5.0 - 8.0   Glucose, UA >=500 (A) NEGATIVE mg/dL   Hgb urine dipstick MODERATE (A) NEGATIVE   Bilirubin Urine NEGATIVE NEGATIVE   Ketones, ur 5 (A) NEGATIVE mg/dL   Protein, ur NEGATIVE NEGATIVE mg/dL   Nitrite NEGATIVE NEGATIVE   Leukocytes,Ua NEGATIVE NEGATIVE   WBC, UA 0-5 0 - 5 WBC/hpf   Bacteria, UA NONE SEEN NONE SEEN    Comment: Performed at [facility], [address] [place], [place] [address]  Lactic acid, plasma     Status: Abnormal   Collection Time: 07/14/18  3:28 AM  Result Value Ref Range   Lactic Acid, Venous 3.1 (HH) 0.5 - 1.9 mmol/L    Comment: CRITICAL RESULT CALLED TO, READ BACK BY  AND VERIFIED WITH: Valinda Party 07/14/18 0403 WAYK Performed at [facility], [address] [place], [place] [address]     Ct Head Wo Contrast  Result Date: 07/14/2018 CLINICAL DATA:  Unwitnessed fall with back and neck pain. Posterior head laceration. EXAM: CT HEAD  WITHOUT CONTRAST CT CERVICAL SPINE WITHOUT CONTRAST TECHNIQUE: Multidetector CT imaging of the head and cervical spine was performed following the standard protocol without intravenous contrast. Multiplanar CT image reconstructions of the cervical spine were also generated. COMPARISON:  None. FINDINGS: CT HEAD FINDINGS Brain: Crescentic subdural hematoma along the left cerebral convexity measuring up to 11 mm in thickness. There is associated cerebral mass effect. No measurable shift on axial slices. Patchy subarachnoid hemorrhage seen in the interpeduncular fossa, supra vermian cistern, left CP angle, and occipital/right parietal sulci. No infarct. Mild lateral ventriculomegaly attributed to volume loss. Vascular: Negative Skull: Right posterior scalp laceration and contusion without underlying calvarial fracture. Sinuses/Orbits: Bilateral cataract resection.  No acute finding Critical Value/emergent results were called by telephone at the time of interpretation on 07/14/2018 at 4:21 am to Dr. Marily Memos , who verbally acknowledged these results. CT CERVICAL SPINE FINDINGS Alignment: Normal Skull base and vertebrae: Negative for fracture. Dysmorphic upper cervical posterior elements, incidental. Soft tissues and spinal canal: No prevertebral fluid or swelling. No visible canal hematoma. A contusion is partially covered superficial to the right scapula, reference chest CT. Disc levels:  No evidence of degenerative impingement Upper chest: Reported separately IMPRESSION: 1. Acute subdural hematoma along the left cerebral convexity measuring up to 11 mm in thickness. Mild cerebral mass effect. 2. Patchy subarachnoid hemorrhage with a traumatic pattern. 3. Negative for cervical spine fracture. Electronically Signed   By: Marnee Spring M.D.   On: 07/14/2018 04:24   Ct Chest W Contrast  Result Date: 07/14/2018 CLINICAL DATA:  Unwitnessed fall with back pain EXAM: CT CHEST, ABDOMEN, AND PELVIS WITH CONTRAST  TECHNIQUE: Multidetector CT imaging of the chest, abdomen and pelvis was performed following the standard protocol during bolus administration of intravenous contrast. CONTRAST:  OMNIPAQUE IOHEXOL 300 MG/ML  SOLN COMPARISON:  None. FINDINGS: CT CHEST FINDINGS Cardiovascular: Normal heart size. No pericardial effusion. Left and right coronary circulation atherosclerotic calcification. Aortic calcification. Mediastinum/Nodes: Negative for pneumomediastinum or hematoma. Lungs/Pleura: Mild subpleural hemorrhage in the posterior right chest associated with rib fractures. No hemothorax, lung contusion, or pneumothorax. There are scattered calcified pulmonary nodules consistent with remote granulomatous disease. Musculoskeletal: Posterior right sixth and seventh rib fractures. The seventh rib fracture is displaced. The cartilage of the right seventh rib is also fractured anteriorly. A subcutaneous contusion is seen over the posterior and right-sided chest wall. CT ABDOMEN PELVIS FINDINGS Hepatobiliary: No hepatic injury or perihepatic hematoma. Gallbladder is unremarkable Pancreas: Negative Spleen: Negative Adrenals/Urinary Tract: No adrenal hemorrhage or renal injury identified. Bladder is unremarkable. Stomach/Bowel: No evidence of injury. Colonic diverticulosis that is generalized Vascular/Lymphatic: Atherosclerotic calcification Reproductive: Large partially covered hydrocele on the left. Hydrocele is also seen on the right, extending into the inguinal canal. Other: No ascites or pneumoperitoneum Musculoskeletal: Subcutaneous contusion involving the right more than left posterior flank. No associated fracture. IMPRESSION: 1. Posterior right sixth and seventh rib fractures with seventh rib fracture displacement. There is also seventh rib cartilage fracture anteriorly. 2. Soft tissue contusions of the back. 3. No evidence of intra-abdominal injury. Electronically Signed   By: Marnee Spring M.D.   On: 07/14/2018  04:36   Ct Cervical Spine Wo Contrast  Result Date:  07/14/2018 CLINICAL DATA:  Unwitnessed fall with back and neck pain. Posterior head laceration. EXAM: CT HEAD WITHOUT CONTRAST CT CERVICAL SPINE WITHOUT CONTRAST TECHNIQUE: Multidetector CT imaging of the head and cervical spine was performed following the standard protocol without intravenous contrast. Multiplanar CT image reconstructions of the cervical spine were also generated. COMPARISON:  None. FINDINGS: CT HEAD FINDINGS Brain: Crescentic subdural hematoma along the left cerebral convexity measuring up to 11 mm in thickness. There is associated cerebral mass effect. No measurable shift on axial slices. Patchy subarachnoid hemorrhage seen in the interpeduncular fossa, supra vermian cistern, left CP angle, and occipital/right parietal sulci. No infarct. Mild lateral ventriculomegaly attributed to volume loss. Vascular: Negative Skull: Right posterior scalp laceration and contusion without underlying calvarial fracture. Sinuses/Orbits: Bilateral cataract resection.  No acute finding Critical Value/emergent results were called by telephone at the time of interpretation on 07/14/2018 at 4:21 am to Dr. Marily Memos , who verbally acknowledged these results. CT CERVICAL SPINE FINDINGS Alignment: Normal Skull base and vertebrae: Negative for fracture. Dysmorphic upper cervical posterior elements, incidental. Soft tissues and spinal canal: No prevertebral fluid or swelling. No visible canal hematoma. A contusion is partially covered superficial to the right scapula, reference chest CT. Disc levels:  No evidence of degenerative impingement Upper chest: Reported separately IMPRESSION: 1. Acute subdural hematoma along the left cerebral convexity measuring up to 11 mm in thickness. Mild cerebral mass effect. 2. Patchy subarachnoid hemorrhage with a traumatic pattern. 3. Negative for cervical spine fracture. Electronically Signed   By: Marnee Spring M.D.   On:  07/14/2018 04:24   Ct Abdomen Pelvis W Contrast  Result Date: 07/14/2018 CLINICAL DATA:  Unwitnessed fall with back pain EXAM: CT CHEST, ABDOMEN, AND PELVIS WITH CONTRAST TECHNIQUE: Multidetector CT imaging of the chest, abdomen and pelvis was performed following the standard protocol during bolus administration of intravenous contrast. CONTRAST:  OMNIPAQUE IOHEXOL 300 MG/ML  SOLN COMPARISON:  None. FINDINGS: CT CHEST FINDINGS Cardiovascular: Normal heart size. No pericardial effusion. Left and right coronary circulation atherosclerotic calcification. Aortic calcification. Mediastinum/Nodes: Negative for pneumomediastinum or hematoma. Lungs/Pleura: Mild subpleural hemorrhage in the posterior right chest associated with rib fractures. No hemothorax, lung contusion, or pneumothorax. There are scattered calcified pulmonary nodules consistent with remote granulomatous disease. Musculoskeletal: Posterior right sixth and seventh rib fractures. The seventh rib fracture is displaced. The cartilage of the right seventh rib is also fractured anteriorly. A subcutaneous contusion is seen over the posterior and right-sided chest wall. CT ABDOMEN PELVIS FINDINGS Hepatobiliary: No hepatic injury or perihepatic hematoma. Gallbladder is unremarkable Pancreas: Negative Spleen: Negative Adrenals/Urinary Tract: No adrenal hemorrhage or renal injury identified. Bladder is unremarkable. Stomach/Bowel: No evidence of injury. Colonic diverticulosis that is generalized Vascular/Lymphatic: Atherosclerotic calcification Reproductive: Large partially covered hydrocele on the left. Hydrocele is also seen on the right, extending into the inguinal canal. Other: No ascites or pneumoperitoneum Musculoskeletal: Subcutaneous contusion involving the right more than left posterior flank. No associated fracture. IMPRESSION: 1. Posterior right sixth and seventh rib fractures with seventh rib fracture displacement. There is also seventh rib  cartilage fracture anteriorly. 2. Soft tissue contusions of the back. 3. No evidence of intra-abdominal injury. Electronically Signed   By: Marnee Spring M.D.   On: 07/14/2018 04:36   Dg Pelvis Portable  Result Date: 07/14/2018 CLINICAL DATA:  Unwitnessed fall EXAM: PORTABLE PELVIS 1-2 VIEWS COMPARISON:  None. FINDINGS: There is no evidence of pelvic fracture or diastasis. No pelvic bone lesions are seen. IMPRESSION: Negative. Electronically Signed  By: Charlett NoseKevin  Dover M.D.   On: 07/14/2018 02:14   Dg Chest Portable 1 View  Result Date: 07/14/2018 CLINICAL DATA:  Unwitnessed fall.  Back pain EXAM: PORTABLE CHEST 1 VIEW COMPARISON:  12/02/2006 FINDINGS: Low lung volumes, bibasilar atelectasis. Heart is borderline in size. No visible effusions or pneumothorax. Posterior right 7th rib fracture. IMPRESSION: Low lung volumes, bibasilar atelectasis. Posterior right 7th rib fracture.  No pneumothorax. Electronically Signed   By: Charlett NoseKevin  Dover M.D.   On: 07/14/2018 02:15    Pertinent items noted in HPI and remainder of comprehensive ROS otherwise negative. Blood pressure (!) 158/78, pulse 81, temperature 98.8 F (37.1 C), temperature source Oral, resp. rate (!) 24, height 5\' 10"  (1.778 m), weight 113.4 kg, SpO2 94 %. Patient is awake and aware.  He is oriented to person but not time or place.  His speech is reasonably fluent.  His judgment insight are impaired.  Examination head ears eyes nose throat demonstrates significant scalp swelling with some mild tenderness.  No obvious bony abnormality.  Oropharynx nasopharynx and external auditory canals clear.  Neck nontender.  Full active range of motion.  Motor examination of the extremities with 5/5 strength bilaterally.  No pronator drift.  Sensory examination nonfocal.  Chest wall with significant tenderness and also with back pain and tenderness.  Abdomen benign.  Extremities free from injury or deformity.  Assessment/Plan: Reasonably small left convexity  acute subdural hematoma with minimal mass-effect.  Plan ICU observation.  Situation complicated by Plavix use.  Plan follow-up head CT scan in a.m.  If hematoma enlarges then I would move towards surgical evacuation.  Kathaleen MaserHenry A Vannesa Abair 07/14/2018, 6:53 AM

## 2018-07-14 NOTE — ED Notes (Signed)
ED TO INPATIENT HANDOFF REPORT  ED Nurse Name and Phone #:  Wendie Chessngela S anders [phone] S Name/Age/Gender [name] Frank 68 y.o. male Room/Bed: 023C/023C  Code Status   Code Status: Not on file  Home/SNF/Other Home Patient oriented to: self and place Is this baseline? No   Triage Complete: Triage complete  Chief Complaint fall  Triage Note Per ems pt was at home and had an unwitnessed fall. Pt is cpmplaining of back and neck pain upon ems arrival. Pt has laceration to back of head but was not able to access bc of pain in back.  CBG 251, 80 P, 123/84 14 GCS, 98 RA,   Allergies No Known Allergies  Level of Care/Admitting Diagnosis ED Disposition    ED Disposition Condition Comment   Admit  [facility] [[id number][facility]  Level of Care: ICU [6]  Covid Evaluation: N/A  Diagnosis: Subdural hematoma [facility](HCC) [[id number]]) [[id number]]  Admitting Physician: TRAUMA MD [[id number]]  Attending Physician: TRAUMA MD [[id number]]  Estimated length of stay: 3 - 4 days  Certification:: I certify this patient will need inpatient services for at least 2 midnights  Bed request comments: 4N  PT Class (Do Not Modify): Inpatient [101]  PT Acc Code (Do Not Modify): Private [1]       B Medical/Surgery History Past Medical History:  Diagnosis Date  . Diabetes mellitus   . Heart disease   . Hyperlipemia   . Hypertension    Past Surgical History:  Procedure Laterality Date  . catarracts    . OTHER SURGICAL HISTORY  2016   HEART STENT  . TONSILLECTOMY       A IV Location/Drains/Wounds Patient Lines/Drains/Airways Status   Active Line/Drains/Airways    Name:   Placement date:   Placement time:   Site:   Days:   Peripheral IV 07/14/18 Right Forearm   07/14/18    0223    Forearm   less than 1   Peripheral IV 07/14/18 Left Forearm   07/14/18    0309    Forearm   less than 1   Peripheral IV 07/14/18 Right Antecubital   07/14/18    0309    Antecubital   less than 1          Intake/Output  Last 24 hours  Intake/Output Summary (Last 24 hours) at 07/14/2018 0617 Last data filed at 07/14/2018 0551 Gross per 24 hour  Intake 3714.95 ml  Output 900 ml  Net 2814.95 ml    Labs/Imaging Results for orders placed or performed during the hospital encounter of 07/14/18 (from the past 48 hour(s))  CBC with Differential     Status: Abnormal   Collection Time: 07/14/18  1:53 AM  Result Value Ref Range   WBC 9.4 4.0 - 10.5 K/uL   RBC 5.37 4.22 - 5.81 MIL/uL   Hemoglobin 15.7 13.0 - 17.0 g/dL   HCT 09.847.8 11.939.0 - 14.752.0 %   MCV 89.0 80.0 - 100.0 fL   MCH 29.2 26.0 - 34.0 pg   MCHC 32.8 30.0 - 36.0 g/dL   RDW 82.913.2 56.211.5 - 13.015.5 %   Platelets 189 150 - 400 K/uL   nRBC 0.0 0.0 - 0.2 %   Neutrophils Relative % 70 %   Neutro Abs 6.7 1.7 - 7.7 K/uL   Lymphocytes Relative 20 %   Lymphs Abs 1.9 0.7 - 4.0 K/uL   Monocytes Relative 7 %   Monocytes Absolute 0.7 0.1 - 1.0 K/uL   Eosinophils  Relative 1 %   Eosinophils Absolute 0.1 0.0 - 0.5 K/uL   Basophils Relative 1 %   Basophils Absolute 0.1 0.0 - 0.1 K/uL   Immature Granulocytes 1 %   Abs Immature Granulocytes 0.09 (H) 0.00 - 0.07 K/uL    Comment: Performed at [facility], [address] [place], [place] [address]  Comprehensive metabolic panel     Status: Abnormal   Collection Time: 07/14/18  1:53 AM  Result Value Ref Range   Sodium 137 135 - 145 mmol/L   Potassium 4.0 3.5 - 5.1 mmol/L   Chloride 104 98 - 111 mmol/L   CO2 22 22 - 32 mmol/L   Glucose, Bld 283 (H) 70 - 99 mg/dL   BUN 26 (H) 8 - 23 mg/dL   Creatinine, Ser 9.35 0.61 - 1.24 mg/dL   Calcium 9.0 8.9 - 70.1 mg/dL   Total Protein 6.5 6.5 - 8.1 g/dL   Albumin 4.1 3.5 - 5.0 g/dL   AST 36 15 - 41 U/L   ALT 41 0 - 44 U/L   Alkaline Phosphatase 48 38 - 126 U/L   Total Bilirubin 0.8 0.3 - 1.2 mg/dL   GFR calc non Af Amer >60 >60 mL/min   GFR calc Af Amer >60 >60 mL/min   Anion gap 11 5 - 15    Comment: Performed at [facility], [address], [place], [place]  [address]  Lactic acid, plasma     Status: Abnormal   Collection Time: 07/14/18  1:53 AM  Result Value Ref Range   Lactic Acid, Venous 3.4 (HH) 0.5 - 1.9 mmol/L    Comment: CRITICAL RESULT CALLED TO, READ BACK BY AND VERIFIED WITH: TOBIAS M,RN 07/14/18 0258 WAYK Performed at [facility] Lab, [address], [place], [place] [address]   Ethanol     Status: None   Collection Time: 07/14/18  1:55 AM  Result Value Ref Range   Alcohol, Ethyl (B) <10 <10 mg/dL    Comment: (NOTE) Lowest detectable limit for serum alcohol is 10 mg/dL. For medical purposes only. Performed at [facility], [address], [place], [place] [address]   CBG monitoring, ED     Status: Abnormal   Collection Time: 07/14/18  2:34 AM  Result Value Ref Range   Glucose-Capillary 257 (H) 70 - 99 mg/dL  Rapid urine drug screen (hospital performed)     Status: None   Collection Time: 07/14/18  3:19 AM  Result Value Ref Range   Opiates NONE DETECTED NONE DETECTED   Cocaine NONE DETECTED NONE DETECTED   Benzodiazepines NONE DETECTED NONE DETECTED   Amphetamines NONE DETECTED NONE DETECTED   Tetrahydrocannabinol NONE DETECTED NONE DETECTED   Barbiturates NONE DETECTED NONE DETECTED    Comment: (NOTE) DRUG SCREEN FOR MEDICAL PURPOSES ONLY.  IF CONFIRMATION IS NEEDED FOR ANY PURPOSE, NOTIFY LAB WITHIN 5 DAYS. LOWEST DETECTABLE LIMITS FOR URINE DRUG SCREEN Drug Class                     Cutoff (ng/mL) Amphetamine and metabolites    1000 Barbiturate and metabolites    200 Benzodiazepine                 200 Tricyclics and metabolites     300 Opiates and metabolites        300 Cocaine and metabolites        300 THC  50 Performed at [facility], [address] [place], [place] [address]   Urinalysis, Routine w reflex microscopic     Status: Abnormal   Collection Time: 07/14/18  3:19 AM  Result Value Ref Range   Color, Urine STRAW (A) YELLOW   APPearance CLEAR CLEAR    Specific Gravity, Urine 1.030 1.005 - 1.030   pH 5.0 5.0 - 8.0   Glucose, UA >=500 (A) NEGATIVE mg/dL   Hgb urine dipstick MODERATE (A) NEGATIVE   Bilirubin Urine NEGATIVE NEGATIVE   Ketones, ur 5 (A) NEGATIVE mg/dL   Protein, ur NEGATIVE NEGATIVE mg/dL   Nitrite NEGATIVE NEGATIVE   Leukocytes,Ua NEGATIVE NEGATIVE   WBC, UA 0-5 0 - 5 WBC/hpf   Bacteria, UA NONE SEEN NONE SEEN    Comment: Performed at [facility], [address] [place], [place] [address]  Lactic acid, plasma     Status: Abnormal   Collection Time: 07/14/18  3:28 AM  Result Value Ref Range   Lactic Acid, Venous 3.1 (HH) 0.5 - 1.9 mmol/L    Comment: CRITICAL RESULT CALLED TO, READ BACK BY AND VERIFIED WITH: Charolett Yarrow A,RN 07/14/18 0403 WAYK Performed at [facility], [address], [place], [place] [address]    Ct Head Wo Contrast  Result Date: 07/14/2018 CLINICAL DATA:  Unwitnessed fall with back and neck pain. Posterior head laceration. EXAM: CT HEAD WITHOUT CONTRAST CT CERVICAL SPINE WITHOUT CONTRAST TECHNIQUE: Multidetector CT imaging of the head and cervical spine was performed following the standard protocol without intravenous contrast. Multiplanar CT image reconstructions of the cervical spine were also generated. COMPARISON:  None. FINDINGS: CT HEAD FINDINGS Brain: Crescentic subdural hematoma along the left cerebral convexity measuring up to 11 mm in thickness. There is associated cerebral mass effect. No measurable shift on axial slices. Patchy subarachnoid hemorrhage seen in the interpeduncular fossa, supra vermian cistern, left CP angle, and occipital/right parietal sulci. No infarct. Mild lateral ventriculomegaly attributed to volume loss. Vascular: Negative Skull: Right posterior scalp laceration and contusion without underlying calvarial fracture. Sinuses/Orbits: Bilateral cataract resection.  No acute finding Critical Value/emergent results were called by telephone at the time of interpretation on  07/14/2018 at 4:21 am to Dr. Marily Memos , who verbally acknowledged these results. CT CERVICAL SPINE FINDINGS Alignment: Normal Skull base and vertebrae: Negative for fracture. Dysmorphic upper cervical posterior elements, incidental. Soft tissues and spinal canal: No prevertebral fluid or swelling. No visible canal hematoma. A contusion is partially covered superficial to the right scapula, reference chest CT. Disc levels:  No evidence of degenerative impingement Upper chest: Reported separately IMPRESSION: 1. Acute subdural hematoma along the left cerebral convexity measuring up to 11 mm in thickness. Mild cerebral mass effect. 2. Patchy subarachnoid hemorrhage with a traumatic pattern. 3. Negative for cervical spine fracture. Electronically Signed   By: Marnee Spring M.D.   On: 07/14/2018 04:24   Ct Chest W Contrast  Result Date: 07/14/2018 CLINICAL DATA:  Unwitnessed fall with back pain EXAM: CT CHEST, ABDOMEN, AND PELVIS WITH CONTRAST TECHNIQUE: Multidetector CT imaging of the chest, abdomen and pelvis was performed following the standard protocol during bolus administration of intravenous contrast. CONTRAST:  OMNIPAQUE IOHEXOL 300 MG/ML  SOLN COMPARISON:  None. FINDINGS: CT CHEST FINDINGS Cardiovascular: Normal heart size. No pericardial effusion. Left and right coronary circulation atherosclerotic calcification. Aortic calcification. Mediastinum/Nodes: Negative for pneumomediastinum or hematoma. Lungs/Pleura: Mild subpleural hemorrhage in the posterior right chest associated with rib fractures. No hemothorax, lung  contusion, or pneumothorax. There are scattered calcified pulmonary nodules consistent with remote granulomatous disease. Musculoskeletal: Posterior right sixth and seventh rib fractures. The seventh rib fracture is displaced. The cartilage of the right seventh rib is also fractured anteriorly. A subcutaneous contusion is seen over the posterior and right-sided chest wall. CT ABDOMEN  PELVIS FINDINGS Hepatobiliary: No hepatic injury or perihepatic hematoma. Gallbladder is unremarkable Pancreas: Negative Spleen: Negative Adrenals/Urinary Tract: No adrenal hemorrhage or renal injury identified. Bladder is unremarkable. Stomach/Bowel: No evidence of injury. Colonic diverticulosis that is generalized Vascular/Lymphatic: Atherosclerotic calcification Reproductive: Large partially covered hydrocele on the left. Hydrocele is also seen on the right, extending into the inguinal canal. Other: No ascites or pneumoperitoneum Musculoskeletal: Subcutaneous contusion involving the right more than left posterior flank. No associated fracture. IMPRESSION: 1. Posterior right sixth and seventh rib fractures with seventh rib fracture displacement. There is also seventh rib cartilage fracture anteriorly. 2. Soft tissue contusions of the back. 3. No evidence of intra-abdominal injury. Electronically Signed   By: Marnee Spring M.D.   On: 07/14/2018 04:36   Ct Cervical Spine Wo Contrast  Result Date: 07/14/2018 CLINICAL DATA:  Unwitnessed fall with back and neck pain. Posterior head laceration. EXAM: CT HEAD WITHOUT CONTRAST CT CERVICAL SPINE WITHOUT CONTRAST TECHNIQUE: Multidetector CT imaging of the head and cervical spine was performed following the standard protocol without intravenous contrast. Multiplanar CT image reconstructions of the cervical spine were also generated. COMPARISON:  None. FINDINGS: CT HEAD FINDINGS Brain: Crescentic subdural hematoma along the left cerebral convexity measuring up to 11 mm in thickness. There is associated cerebral mass effect. No measurable shift on axial slices. Patchy subarachnoid hemorrhage seen in the interpeduncular fossa, supra vermian cistern, left CP angle, and occipital/right parietal sulci. No infarct. Mild lateral ventriculomegaly attributed to volume loss. Vascular: Negative Skull: Right posterior scalp laceration and contusion without underlying calvarial  fracture. Sinuses/Orbits: Bilateral cataract resection.  No acute finding Critical Value/emergent results were called by telephone at the time of interpretation on 07/14/2018 at 4:21 am to Dr. Marily Memos , who verbally acknowledged these results. CT CERVICAL SPINE FINDINGS Alignment: Normal Skull base and vertebrae: Negative for fracture. Dysmorphic upper cervical posterior elements, incidental. Soft tissues and spinal canal: No prevertebral fluid or swelling. No visible canal hematoma. A contusion is partially covered superficial to the right scapula, reference chest CT. Disc levels:  No evidence of degenerative impingement Upper chest: Reported separately IMPRESSION: 1. Acute subdural hematoma along the left cerebral convexity measuring up to 11 mm in thickness. Mild cerebral mass effect. 2. Patchy subarachnoid hemorrhage with a traumatic pattern. 3. Negative for cervical spine fracture. Electronically Signed   By: Marnee Spring M.D.   On: 07/14/2018 04:24   Ct Abdomen Pelvis W Contrast  Result Date: 07/14/2018 CLINICAL DATA:  Unwitnessed fall with back pain EXAM: CT CHEST, ABDOMEN, AND PELVIS WITH CONTRAST TECHNIQUE: Multidetector CT imaging of the chest, abdomen and pelvis was performed following the standard protocol during bolus administration of intravenous contrast. CONTRAST:  OMNIPAQUE IOHEXOL 300 MG/ML  SOLN COMPARISON:  None. FINDINGS: CT CHEST FINDINGS Cardiovascular: Normal heart size. No pericardial effusion. Left and right coronary circulation atherosclerotic calcification. Aortic calcification. Mediastinum/Nodes: Negative for pneumomediastinum or hematoma. Lungs/Pleura: Mild subpleural hemorrhage in the posterior right chest associated with rib fractures. No hemothorax, lung contusion, or pneumothorax. There are scattered calcified pulmonary nodules consistent with remote granulomatous disease. Musculoskeletal: Posterior right sixth and seventh rib fractures. The seventh rib fracture is  displaced. The cartilage of  the right seventh rib is also fractured anteriorly. A subcutaneous contusion is seen over the posterior and right-sided chest wall. CT ABDOMEN PELVIS FINDINGS Hepatobiliary: No hepatic injury or perihepatic hematoma. Gallbladder is unremarkable Pancreas: Negative Spleen: Negative Adrenals/Urinary Tract: No adrenal hemorrhage or renal injury identified. Bladder is unremarkable. Stomach/Bowel: No evidence of injury. Colonic diverticulosis that is generalized Vascular/Lymphatic: Atherosclerotic calcification Reproductive: Large partially covered hydrocele on the left. Hydrocele is also seen on the right, extending into the inguinal canal. Other: No ascites or pneumoperitoneum Musculoskeletal: Subcutaneous contusion involving the right more than left posterior flank. No associated fracture. IMPRESSION: 1. Posterior right sixth and seventh rib fractures with seventh rib fracture displacement. There is also seventh rib cartilage fracture anteriorly. 2. Soft tissue contusions of the back. 3. No evidence of intra-abdominal injury. Electronically Signed   By: Marnee Spring M.D.   On: 07/14/2018 04:36   Dg Pelvis Portable  Result Date: 07/14/2018 CLINICAL DATA:  Unwitnessed fall EXAM: PORTABLE PELVIS 1-2 VIEWS COMPARISON:  None. FINDINGS: There is no evidence of pelvic fracture or diastasis. No pelvic bone lesions are seen. IMPRESSION: Negative. Electronically Signed   By: Charlett Nose M.D.   On: 07/14/2018 02:14   Dg Chest Portable 1 View  Result Date: 07/14/2018 CLINICAL DATA:  Unwitnessed fall.  Back pain EXAM: PORTABLE CHEST 1 VIEW COMPARISON:  12/02/2006 FINDINGS: Low lung volumes, bibasilar atelectasis. Heart is borderline in size. No visible effusions or pneumothorax. Posterior right 7th rib fracture. IMPRESSION: Low lung volumes, bibasilar atelectasis. Posterior right 7th rib fracture.  No pneumothorax. Electronically Signed   By: Charlett Nose M.D.   On: 07/14/2018 02:15     Pending Labs Unresulted Labs (From admission, onward)    Start     Ordered   07/14/18 0617  SARS Coronavirus 2 (CEPHEID - Performed in [facility] hospital lab), Hosp Order  (Asymptomatic Patients Labs with Precautions )  Once,   R    Question:  Rule Out  Answer:  Yes   07/14/18 0616   Signed and Held  HIV antibody (Routine Testing)  Once,   R     Signed and Held   Signed and Held  Hemoglobin A1c  Once,   R    Comments:  To assess prior glycemic control    Signed and Held          Vitals/Pain Today's Vitals   07/14/18 0415 07/14/18 0430 07/14/18 0445 07/14/18 0545  BP: (!) 159/76 (!) 166/72 (!) 145/70 (!) 158/78  Pulse: 81 84 68 81  Resp: (!) (!) 24  Temp:      TempSrc:      SpO2: 95% 97% 97% 94%  Weight:      Height:      PainSc:        Isolation Precautions No active isolations  Medications Medications  lactated ringers bolus 2,000 mL (0 mLs Intravenous Stopped 07/14/18 0551)  lidocaine-EPINEPHrine (XYLOCAINE W/EPI) 2 %-1:100000 (with pres) injection 20 mL (20 mLs Infiltration Given 07/14/18 0407)  iohexol (OMNIPAQUE) 300 MG/ML solution 125 mL (125 mLs Intravenous Contrast Given 07/14/18 0409)  fentaNYL (SUBLIMAZE) injection 100 mcg (100 mcg Intravenous Given 07/14/18 0506)    Mobility walks Moderate fall risk   Focused Assessments Neuro Assessment Handoff:  Swallow screen pass? no order Cardiac Rhythm: Normal sinus rhythm       Neuro Assessment: Exceptions to WDL Neuro Checks:      Last Documented NIHSS Modified Score:   Has TPA been  given? No If patient is a Neuro Trauma and patient is going to OR before floor call report to 4N Charge nurse: [phone] or [phone]     R Recommendations: See Admitting Provider Note  Report given to:   Additional Notes: Pt is alert to self and year. He will follow commands and answer all questions and follow commands

## 2018-07-14 NOTE — ED Notes (Signed)
Pt pulled out both iv's from his hands. Placed two more and placed mittens on his hands. Pt is confused but alert and keeps asking same questions over and over again.

## 2018-07-15 ENCOUNTER — Inpatient Hospital Stay (HOSPITAL_COMMUNITY): Payer: Medicare HMO

## 2018-07-15 LAB — GLUCOSE, CAPILLARY
Glucose-Capillary: 180 mg/dL — ABNORMAL HIGH (ref 70–99)
Glucose-Capillary: 239 mg/dL — ABNORMAL HIGH (ref 70–99)
Glucose-Capillary: 142 mg/dL — ABNORMAL HIGH (ref 70–99)

## 2018-07-15 IMAGING — CT CT HEAD WITHOUT CONTRAST
4 series · 15 of 47 positions shown, 17 images · non-contrast
Comparison: [DATE] CT head

CLINICAL DATA: 67 y/o  M; subdural hematoma.

EXAM:
CT HEAD WITHOUT CONTRAST
TECHNIQUE: Contiguous axial images were obtained from the base of the skull
through the vertex without intravenous contrast.

[Series 3: head without · axial · non-contrast · 0.50mm/px · z∈[-94,+41]mm · 6 of 39 slices shown, 8 images]
[im 6/39  brain]
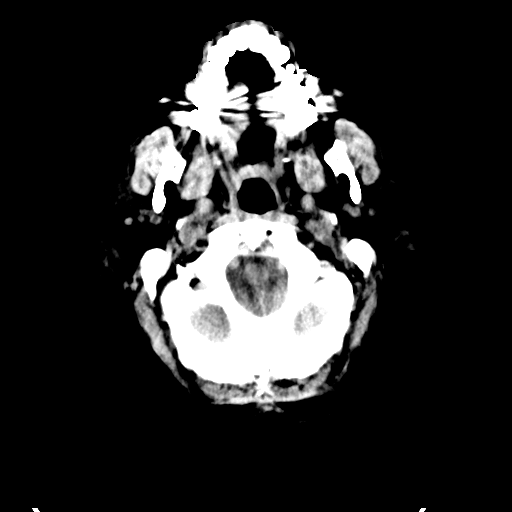
[im 6/39  bone]
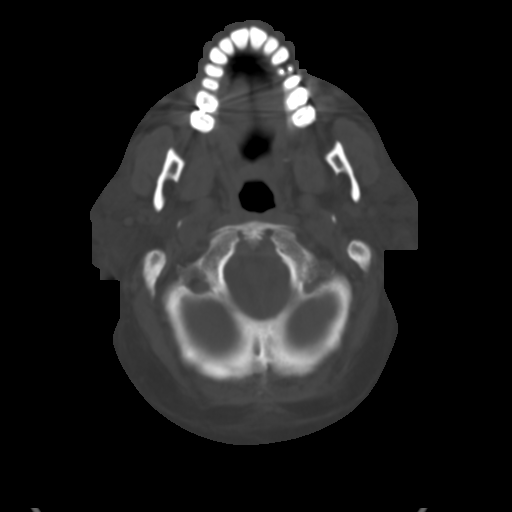
[im 11/39  brain]
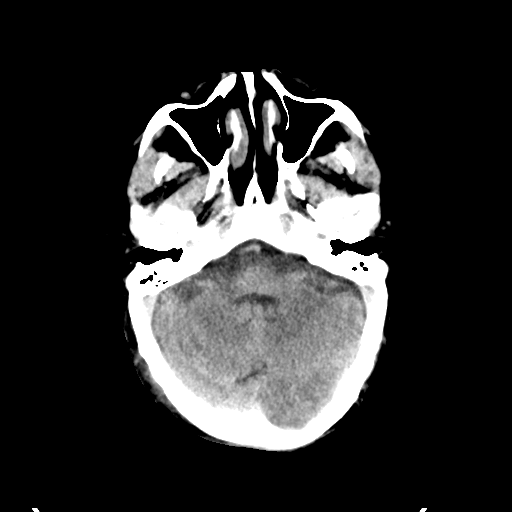
[im 17/39  brain]
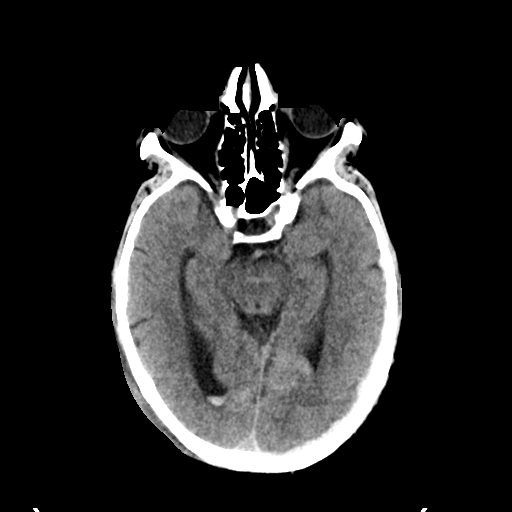
[im 22/39  brain]
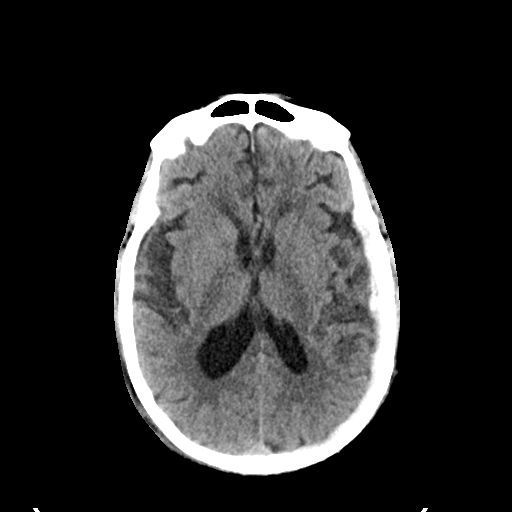
[im 28/39  brain]
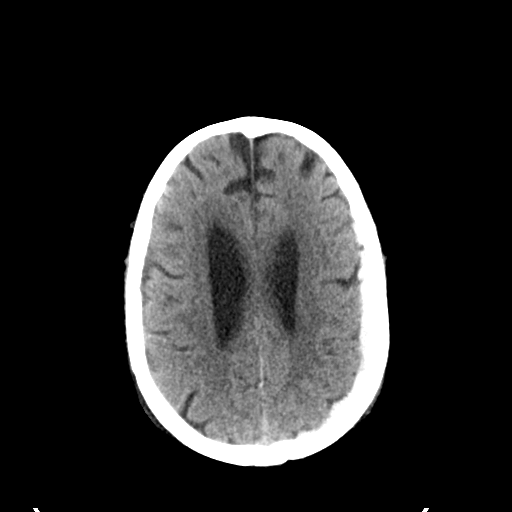
[im 28/39  bone]
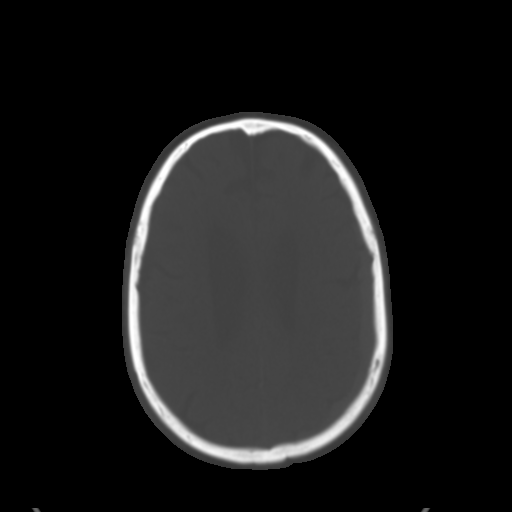
[im 33/39  brain]
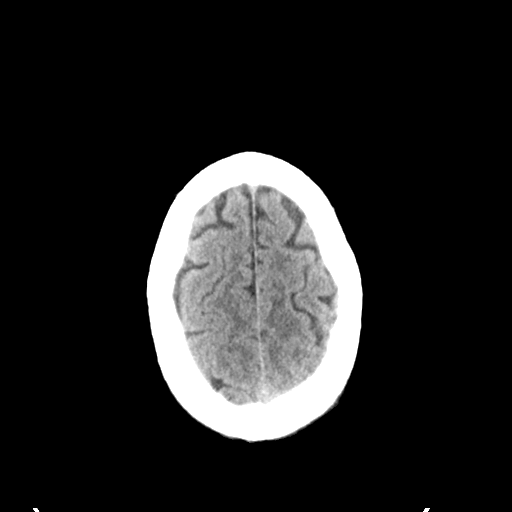

[Series 4: head bone · axial · 0.50mm/px · z∈[-101,-55]mm · 3 of 99 slices shown]
[im 10/99  bone]
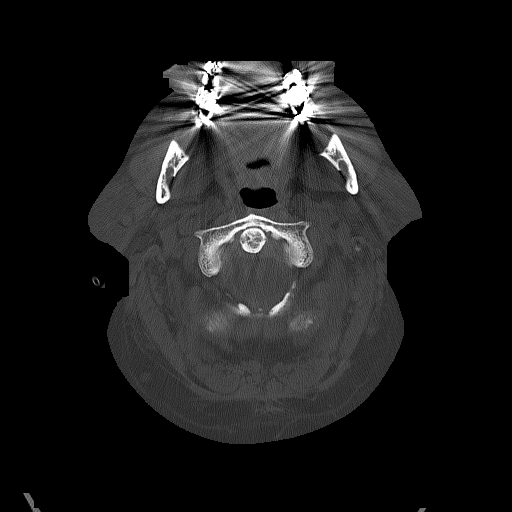
[im 19/99  bone]
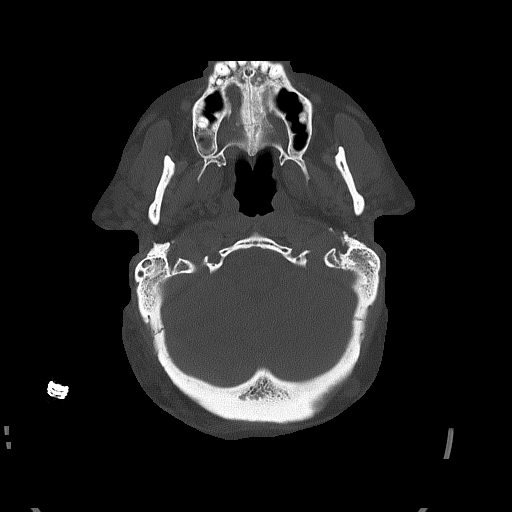
[im 33/99  bone]
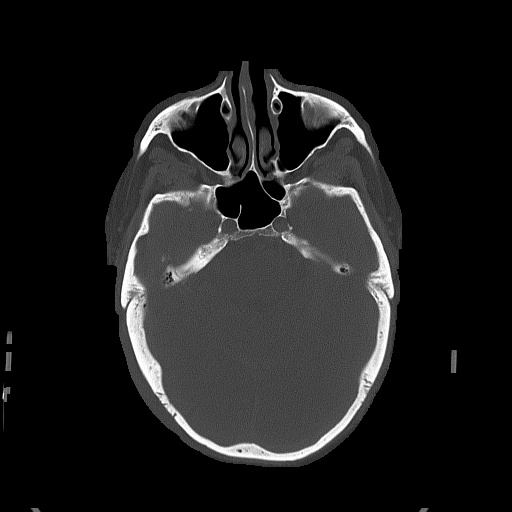

[Series 5: head without cor · coronal · non-contrast · 0.38mm/px · 3 of 76 slices shown]
[im 26/76  brain]
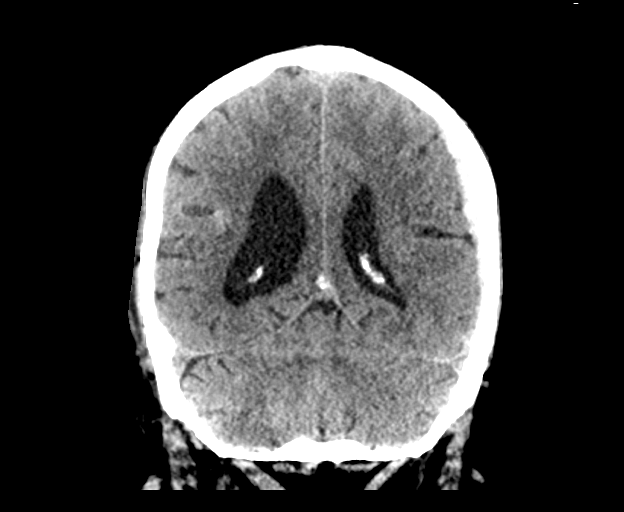
[im 34/76  brain]
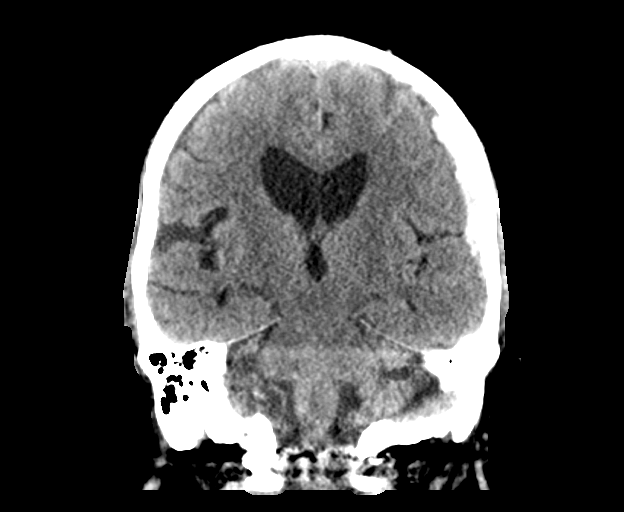
[im 42/76  brain]
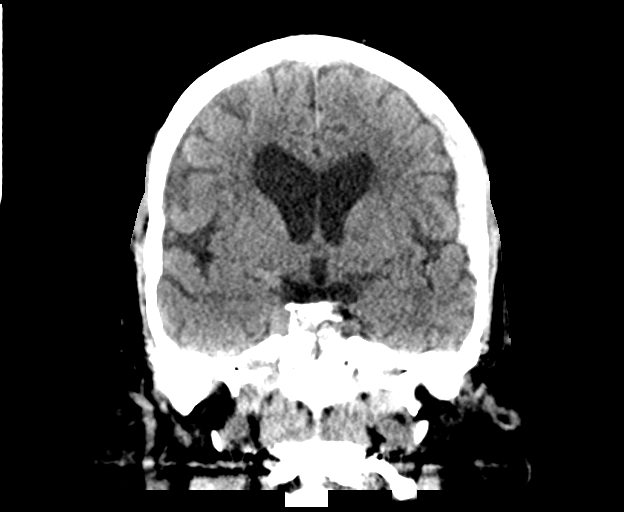

[Series 6: head without sag · sagittal · non-contrast · 0.38mm/px · 3 of 65 slices shown]
[im 22/65  brain]
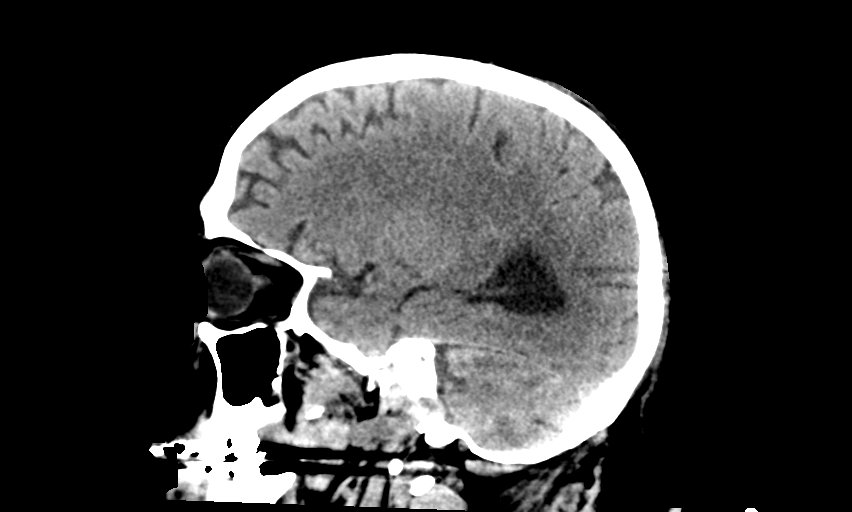
[im 33/65  brain]
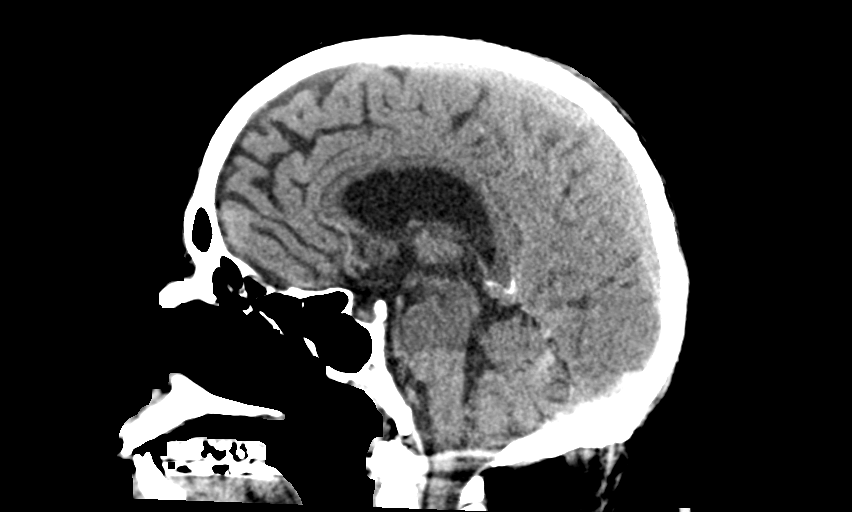
[im 43/65  brain]
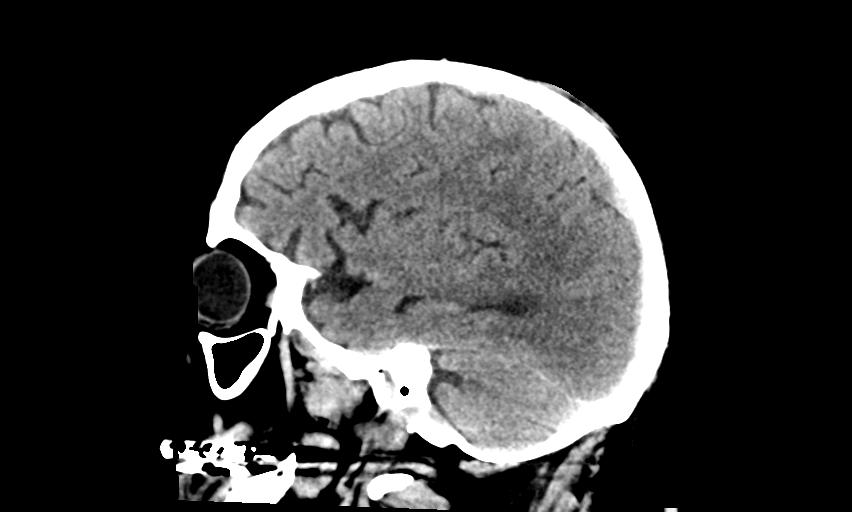

[15 of 47 positions shown; findings below may reference images not displayed]

FINDINGS: Brain: Left-sided subdural hematoma is overall stable in volume,
with decreased greatest thickness to 8 mm with and increased
settling of blood products over the posterior convexity. Stable
small volume of subarachnoid and intraventricular hemorrhage with
redistribution to the occipital horns of lateral ventricles. No new
intracranial hemorrhage, mass effect, stroke, or herniation. Stable
ventricle size.

Vascular: No hyperdense vessel or unexpected calcification.

Skull: Right posterior scalp contusion and laceration with skin
staples. No calvarial fracture

Sinuses/Orbits: No acute finding.

Other: Bilateral intra-ocular lens replacement
IMPRESSION: 1. Stable volume left-sided subdural hematoma with decreased
greatest thickness to 8 mm and increased settling of blood products
over the posterior convexity. No midline shift.
2. Stable small volume of subarachnoid and intraventricular
hemorrhage with mild redistribution to occipital horns of lateral
ventricles. Stable ventricle size.
3. No new acute intracranial abnormality.

## 2018-07-15 MED ORDER — TRAMADOL-ACETAMINOPHEN 37.5-325 MG PO TABS
1.0000 | ORAL_TABLET | Freq: Four times a day (QID) | ORAL | Status: DC | PRN
  Administered 2018-07-15 – 2018-07-16 (×3): 2 via ORAL
  Filled 2018-07-15 (×4): qty 2

## 2018-07-15 MED ORDER — TRAMADOL-ACETAMINOPHEN 37.5-325 MG PO TABS: 1.0000 | ORAL_TABLET | Freq: Four times a day (QID) | ORAL | Status: DC | PRN

## 2018-07-15 NOTE — Progress Notes (Signed)
Pt transferred to room 4 No 14, bedside report given to [facility]. Wife notified of pts new location.

## 2018-07-15 NOTE — Progress Notes (Signed)
SLP Cancellation Note  Patient Details Name: OSCAR CARPENITO MRN: [id number] DOB: 23-Jun-1950   Cancelled treatment:       Reason Eval/Treat Not Completed: Other (comment)(patient discussing financial matters with his wife). Will attempt next date.   Elio Forget Tarrell 07/15/2018, 4:17 PM    Angela Nevin, MA, CCC-SLP Speech Therapy [facility] Pager: [phone]

## 2018-07-15 NOTE — Progress Notes (Signed)
Overall stable.  No new events or problems overnight.  Patient much more oriented and appropriate today.  Complains of headache but this is less than yesterday.  Main complaint is that of chest wall pain.  Patient is afebrile.  Vital signs are stable.  He is awake and alert.  He is oriented to person place and time.  He answers questions appropriately.  He follows commands bilaterally.  Strength is equal.  Pupils are equal bilaterally.  Follow-up head CT scan demonstrates stable if not slightly improved appearance of his left convexity subdural hematoma.  No significant mass-effect.  No midline shift.  Patient with small stable left convexity subdural hematoma secondary to  trauma.  Plan to mobilize today.  Okay for patient to be fed from my standpoint.  Patient will need one last head CT scan in 2 to 3 days.

## 2018-07-15 NOTE — Progress Notes (Signed)
Inpatient Diabetes Program Recommendations  AACE/ADA: New Consensus Statement on Inpatient Glycemic Control  Target Ranges:  Prepandial:   less than 140 mg/dL      Peak postprandial:   less than 180 mg/dL (1-2 hours)      Critically ill patients:  140 - 180 mg/dL   Results for TIMOTHYJAMES, BRODZIK (MRN [id number]) as of 07/15/2018 13:23  Ref. Range 07/14/2018 09:03 07/14/2018 11:33 07/14/2018 16:13 07/14/2018 20:34 07/14/2018 23:50 07/15/2018 08:22 07/15/2018 12:06  Glucose-Capillary Latest Ref Range: 70 - 99 mg/dL 500 (H) 370 (H) 488 (H) 135 (H) 122 (H) 180 (H) 239 (H)   Review of Glycemic Control  Diabetes history: DM2 Outpatient Diabetes medications: Glipizide XL 10 mg BID, Invokamet [phone] BID Current orders for Inpatient glycemic control: Novolog 0-9 units TID with meals  Inpatient Diabetes Program Recommendations:   Correction (SSI): Please consider ordering Novolog 0-5 units QHS for bedtime correction.  Diet: If appropriate, please consider changing diet from Regular to Carb Modified.  Thanks, Orlando Penner, RN, MSN, CDE Diabetes Coordinator Inpatient Diabetes Program [phone] (Team Pager from 8am to 5pm)

## 2018-07-15 NOTE — Evaluation (Signed)
Occupational Therapy Evaluation Patient Details Name: [name] HeritageDonald G Clock MRN: [id number] DOB: Mar 19, 1950 Today's Date: 07/15/2018    History of Present Illness Patient is a 68 yo M s/p fall on anticoagulation with resultant SDH, SAH, R 6-7 ribs fx   Clinical Impression   Pt admitted with above. He demonstrates the below listed deficits and will benefit from continued OT to maximize safety and independence with BADLs.  Pt presents to OT with impaired cognition including deficits with attention, problem solving, organization, memory; impaired balance, as well as visual deficits including nystagmus and mildly dysconjugate gaze.  He requires min A for ADLs and functional mobility, and demonstrates behaviors consistent with [place] Level VII.  He lives with his wife, and was fully independent including working part time.  He will need 24 hour assist at discharge.       Follow Up Recommendations  Outpatient OT;Supervision/Assistance - 24 hour    Equipment Recommendations       Recommendations for Other Services       Precautions / Restrictions Precautions Precautions: Fall      Mobility Bed Mobility Overal bed mobility: Needs Assistance Bed Mobility: Supine to Sit     Supine to sit: Mod assist;HOB elevated;+2 for physical assistance     General bed mobility comments: assist to move LEs off EOB and to lift trunk from bed   Transfers Overall transfer level: Needs assistance                    Balance Overall balance assessment: Needs assistance Sitting-balance support: Feet supported Sitting balance-Leahy Scale: Fair Sitting balance - Comments: able to maintain static sitting with min guard assist    Standing balance support: No upper extremity supported Standing balance-Leahy Scale: Fair Standing balance comment: able to stand statically with min guard assist                            ADL either performed or assessed with clinical judgement   ADL Overall  ADL's : Needs assistance/impaired Eating/Feeding: Independent;Sitting   Grooming: Wash/dry hands;Wash/dry face;Oral care;Brushing hair;Minimal assistance;Standing   Upper Body Bathing: Minimal assistance;Sitting   Lower Body Bathing: Maximal assistance;Sit to/from stand   Upper Body Dressing : Moderate assistance;Sitting   Lower Body Dressing: Maximal assistance;Sit to/from stand   Toilet Transfer: Minimal assistance;Ambulation;Comfort height toilet;Grab bars   Toileting- Clothing Manipulation and Hygiene: Moderate assistance       Functional mobility during ADLs: Minimal assistance General ADL Comments: Pt limited by pain      Vision Baseline Vision/History: Wears glasses Wears Glasses: At all times Patient Visual Report: Blurring of vision Vision Assessment?: Yes Eye Alignment: Impaired (comment) Additional Comments: Pt initially with vertical nystagmus and symptomatic dizziness upon moving to EOB.  No nystagmus noted with pursuits with pt statically seated, however, when pt looked up, nystagmus was noted.  He demonstrates mildly dysconjugate gaze and does endorse 1 1/2 objects/shadowing     Perception Perception Perception Tested?: Yes   Praxis Praxis Praxis tested?: Within functional limits    Pertinent Vitals/Pain Pain Assessment: 0-10 Pain Score: 7  Pain Location: Rt ribs and back  Pain Descriptors / Indicators: Aching;Grimacing;Guarding;Throbbing Pain Intervention(s): Monitored during session;Repositioned     Hand Dominance Right   Extremity/Trunk Assessment Upper Extremity Assessment Upper Extremity Assessment: Generalized weakness   Lower Extremity Assessment Lower Extremity Assessment: Defer to PT evaluation   Cervical / Trunk Assessment Cervical / Trunk Assessment: Other exceptions Cervical /  Trunk Exceptions: Pt very guarded due to pain    Communication Communication Communication: No difficulties   Cognition Arousal/Alertness:  Awake/alert Behavior During Therapy: Impulsive Overall Cognitive Status: Impaired/Different from baseline Area of Impairment: Orientation;Attention;Memory;Following commands;Safety/judgement;Awareness;Problem solving;Rancho level               Rancho Levels of Cognitive Functioning [place] Scales of Cognitive Functioning: Automatic/appropriate Orientation Level: Disoriented to;Time Current Attention Level: Selective Memory: Decreased short-term memory Following Commands: Follows one step commands consistently Safety/Judgement: Decreased awareness of safety;Decreased awareness of deficits Awareness: Intellectual;Emergent Problem Solving: Requires verbal cues;Requires tactile cues General Comments: Pt frequently self distracts, and requires cues to redirect to task.  He requires mod cues for safety    General Comments       Exercises     Shoulder Instructions      Home Living Family/patient expects to be discharged to:: Private residence Living Arrangements: Spouse/significant other Available Help at Discharge: Family;Available 24 hours/day Type of Home: House Home Access: Stairs to enter     Home Layout: Multi-level     Bathroom Shower/Tub: Arts development officer Toilet: Handicapped height     Home Equipment: None;Shower seat - built in;Grab bars - tub/shower          Prior Functioning/Environment Level of Independence: Independent        Comments: Pt reports he is retired from [employer] and now works part time for a Occupational psychologist company         OT Problem List: Decreased strength;Decreased activity tolerance;Impaired balance (sitting and/or standing);Decreased cognition;Decreased safety awareness;Impaired vision/perception      OT Treatment/Interventions: Self-care/ADL training;Therapeutic activities;Cognitive remediation/compensation;Visual/perceptual remediation/compensation;Patient/family education;Balance training;DME and/or AE  instruction    OT Goals(Current goals can be found in the care plan section) Acute Rehab OT Goals Patient Stated Goal: to go back to work  OT Goal Formulation: With patient Time For Goal Achievement: 07/29/18 Potential to Achieve Goals: Good  OT Frequency: Min 2X/week   Barriers to D/C:            Co-evaluation PT/OT/SLP Co-Evaluation/Treatment: Yes Reason for Co-Treatment: For patient/therapist safety;Necessary to address cognition/behavior during functional activity          AM-PAC OT "6 Clicks" Daily Activity     Outcome Measure Help from another person eating meals?: None Help from another person taking care of personal grooming?: A Little Help from another person toileting, which includes using toliet, bedpan, or urinal?: A Little Help from another person bathing (including washing, rinsing, drying)?: A Little Help from another person to put on and taking off regular upper body clothing?: A Little Help from another person to put on and taking off regular lower body clothing?: A Little 6 Click Score: 19   End of Session Equipment Utilized During Treatment: Gait belt Nurse Communication: Mobility status  Activity Tolerance: Patient limited by pain Patient left: in chair;with call bell/phone within reach;with chair alarm set  OT Visit Diagnosis: Unsteadiness on feet (R26.81);Cognitive communication deficit (R41.841)                Time: 5284-1324 OT Time Calculation (min): 32 min Charges:  OT General Charges $OT Visit: 1 Visit OT Evaluation $OT Eval Moderate Complexity: 1 Mod  Jeani Hawking, OTR/L Acute Rehabilitation Services Pager [phone] Office [phone]   Jeani Hawking M 07/15/2018, 2:27 PM

## 2018-07-15 NOTE — Evaluation (Signed)
Physical Therapy Evaluation Patient Details Name: [name] HeritageDonald G Frank MRN: [id number] DOB: Mar 27, 1950 Today's Date: 07/15/2018   History of Present Illness  Patient is a 68 yo M s/p fall on anticoagulation with resultant SDH, SAH, R 6-7 ribs fx  Clinical Impression  Orders received for PT evaluation. Patient demonstrates deficits in functional mobility as indicated below. Will benefit from continued skilled PT to address deficits and maximize function. Will see as indicated and progress as tolerated.  Patient with some noted instability during ambulation and difficulty with head turns and dynamic movements. Patient remains high fall risk at this time, will need 24 hour supervision and highly recommend outpatient PT follow up, if unable to arrange transportation then may need to consider HHPT.    Follow Up Recommendations Outpatient PT;Supervision/Assistance - 24 hour    Equipment Recommendations  (TBD)    Recommendations for Other Services       Precautions / Restrictions Precautions Precautions: Fall      Mobility  Bed Mobility Overal bed mobility: Needs Assistance Bed Mobility: Supine to Sit     Supine to sit: Mod assist;HOB elevated;+2 for physical assistance     General bed mobility comments: assist to move LEs off EOB and to lift trunk from bed   Transfers Overall transfer level: Needs assistance Equipment used: 2 person hand held assist             General transfer comment: min assist for stability  Ambulation/Gait Ambulation/Gait assistance: Min assist;Min guard Gait Distance (Feet): 340 Feet Assistive device: 2 person hand held assist Gait Pattern/deviations: Drifts right/left;Decreased stride length;Step-through pattern Gait velocity: decreased   General Gait Details: patient with some noted instability during transitional movements, turns and distracted gait with evidence of left deficits > R.   Stairs            Wheelchair Mobility    Modified  Rankin (Stroke Patients Only)       Balance Overall balance assessment: Needs assistance;History of Falls Sitting-balance support: Feet supported Sitting balance-Leahy Scale: Fair Sitting balance - Comments: able to maintain static sitting with min guard assist    Standing balance support: No upper extremity supported Standing balance-Leahy Scale: Fair Standing balance comment: able to stand statically with min guard assist              High level balance activites: Direction changes;Turns;Head turns High Level Balance Comments: min assist required for instbaility noted with these tasks             Pertinent Vitals/Pain Pain Assessment: 0-10 Pain Score: 7  Pain Location: Rt ribs and back  Pain Descriptors / Indicators: Aching;Grimacing;Guarding;Throbbing Pain Intervention(s): Monitored during session;Repositioned    Home Living Family/patient expects to be discharged to:: Private residence Living Arrangements: Spouse/significant other Available Help at Discharge: Family;Available 24 hours/day Type of Home: House Home Access: Stairs to enter     Home Layout: Multi-level Home Equipment: None;Shower seat - built in;Grab bars - tub/shower      Prior Function Level of Independence: Independent         Comments: Pt reports he is retired from [employer] and 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      Hand Dominance   Dominant Hand: Right    Extremity/Trunk Assessment   Upper Extremity Assessment Upper Extremity Assessment: Generalized weakness    Lower Extremity Assessment Lower Extremity Assessment: Defer to PT evaluation    Cervical / Trunk Assessment Cervical / Trunk Assessment: Other exceptions  Cervical / Trunk Exceptions: Pt very guarded due to pain   Communication   Communication: No difficulties  Cognition Arousal/Alertness: Awake/alert Behavior During Therapy: Impulsive Overall Cognitive Status: Impaired/Different from  baseline Area of Impairment: Orientation;Attention;Memory;Following commands;Safety/judgement;Awareness;Problem solving;Rancho level               Rancho Levels of Cognitive Functioning [place] Mirant Scales of Cognitive Functioning: Automatic/appropriate Orientation Level: Disoriented to;Time Current Attention Level: Selective Memory: Decreased short-term memory Following Commands: Follows one step commands consistently Safety/Judgement: Decreased awareness of safety;Decreased awareness of deficits Awareness: Intellectual;Emergent Problem Solving: Requires verbal cues;Requires tactile cues General Comments: Pt frequently self distracts, and requires cues to redirect to task.  He requires mod cues for safety       General Comments      Exercises     Assessment/Plan    PT Assessment Patient needs continued PT services  PT Problem List Decreased strength;Decreased activity tolerance;Decreased balance;Decreased mobility;Decreased cognition;Decreased safety awareness       PT Treatment Interventions Gait training;Stair training;Functional mobility training;Therapeutic activities;Therapeutic exercise;Balance training;Neuromuscular re-education;Cognitive remediation;Patient/family education    PT Goals (Current goals can be found in the Care Plan section)  Acute Rehab PT Goals Patient Stated Goal: to go back to work  PT Goal Formulation: With patient Time For Goal Achievement: 07/29/18 Potential to Achieve Goals: Good    Frequency Min 3X/week   Barriers to discharge        Co-evaluation PT/OT/SLP Co-Evaluation/Treatment: Yes Reason for Co-Treatment: For patient/therapist safety;Necessary to address cognition/behavior during functional activity PT goals addressed during session: Mobility/safety with mobility;Balance         AM-PAC PT "6 Clicks" Mobility  Outcome Measure Help needed turning from your back to your side while in a flat bed without using bedrails?:  A Lot Help needed moving from lying on your back to sitting on the side of a flat bed without using bedrails?: A Little Help needed moving to and from a bed to a chair (including a wheelchair)?: A Little Help needed standing up from a chair using your arms (e.g., wheelchair or bedside chair)?: A Little Help needed to walk in hospital room?: A Little Help needed climbing 3-5 steps with a railing? : A Lot 6 Click Score: 16    End of Session Equipment Utilized During Treatment: Gait belt Activity Tolerance: Patient tolerated treatment well Patient left: in chair;with call bell/phone within reach;with chair alarm set Nurse Communication: Mobility status PT Visit Diagnosis: Unsteadiness on feet (R26.81);Difficulty in walking, not elsewhere classified (R26.2);Other symptoms and signs involving the nervous system (R29.898)    Time: 8682-5749 PT Time Calculation (min) (ACUTE ONLY): 32 min   Charges:   PT Evaluation $PT Eval Moderate Complexity: 1 Mod          Charlotte Crumb, PT DPT  Board Certified Neurologic Specialist Acute Rehabilitation Services Pager [phone] Office [phone]   Fabio Asa 07/15/2018, 3:15 PM

## 2018-07-15 NOTE — Progress Notes (Signed)
1520 Pt received from Neuro ICU via chair, Report received and patient oriented to room, telemetry hooked up, Sp02, NBP. All lines secure. Staples intact in occipital wound, no bleeding. Alert and oriented. Purposeful movement x 4. Right rear rib fax pain when attempts to get OOB or OOChair. Called wife. Gabriel Cirri RN

## 2018-07-15 NOTE — Progress Notes (Signed)
Subjective/Chief Complaint: Pt alert today   Objective: Vital signs in last 24 hours: Temp:  [98.2 F (36.8 C)-100.3 F (37.9 C)] 98.2 F (36.8 C) (05/05 0800) Pulse Rate:  [70-95] 83 (05/05 0700) Resp:  [8-38] 8 (05/05 0700) BP: (110-171)/(62-87) 171/85 (05/05 0700) SpO2:  [95 %-99 %] 98 % (05/05 0700)    Intake/Output from previous day: 05/04 0701 - 05/05 0700 In: 2177 [I.V.:2177] Out: 2875 [Urine:2875] Intake/Output this shift: No intake/output data recorded.  Constitutional: No acute distress, conversant, appears states age. Eyes: Anicteric sclerae, moist conjunctiva, no lid lag Lungs: Clear to auscultation bilaterally, normal respiratory effort, R rib pain CV: regular rate and rhythm, no murmurs, no peripheral edema, pedal pulses 2+ GI: Soft, no masses or hepatosplenomegaly, non-tender to palpation Skin: No rashes, palpation reveals normal turgor Psychiatric: appropriate judgment and insight, oriented to person, place, and time   Lab Results:  Recent Labs    07/14/18 0153  WBC 9.4  HGB 15.7  HCT 47.8  PLT 189   BMET Recent Labs    07/14/18 0153  NA 137  K 4.0  CL 104  CO2 22  GLUCOSE 283*  BUN 26*  CREATININE 1.13  CALCIUM 9.0   Studies/Results: Ct Head Wo Contrast  Result Date: 07/15/2018 CLINICAL DATA:  68 y/o  M; subdural hematoma. EXAM: CT HEAD WITHOUT CONTRAST TECHNIQUE: Contiguous axial images were obtained from the base of the skull through the vertex without intravenous contrast. COMPARISON:  07/14/2018 CT head FINDINGS: Brain: Left-sided subdural hematoma is overall stable in volume, with decreased greatest thickness to 8 mm with and increased settling of blood products over the posterior convexity. Stable small volume of subarachnoid and intraventricular hemorrhage with redistribution to the occipital horns of lateral ventricles. No new intracranial hemorrhage, mass effect, stroke, or herniation. Stable ventricle size. Vascular: No  hyperdense vessel or unexpected calcification. Skull: Right posterior scalp contusion and laceration with skin staples. No calvarial fracture Sinuses/Orbits: No acute finding. Other: Bilateral intra-ocular lens replacement IMPRESSION: 1. Stable volume left-sided subdural hematoma with decreased greatest thickness to 8 mm and increased settling of blood products over the posterior convexity. No midline shift. 2. Stable small volume of subarachnoid and intraventricular hemorrhage with mild redistribution to occipital horns of lateral ventricles. Stable ventricle size. 3. No new acute intracranial abnormality. Electronically Signed   By: Mitzi Hansen M.D.   On: 07/15/2018 00:48   Ct Head Wo Contrast  Result Date: 07/14/2018 CLINICAL DATA:  Unwitnessed fall with back and neck pain. Posterior head laceration. EXAM: CT HEAD WITHOUT CONTRAST CT CERVICAL SPINE WITHOUT CONTRAST TECHNIQUE: Multidetector CT imaging of the head and cervical spine was performed following the standard protocol without intravenous contrast. Multiplanar CT image reconstructions of the cervical spine were also generated. COMPARISON:  None. FINDINGS: CT HEAD FINDINGS Brain: Crescentic subdural hematoma along the left cerebral convexity measuring up to 11 mm in thickness. There is associated cerebral mass effect. No measurable shift on axial slices. Patchy subarachnoid hemorrhage seen in the interpeduncular fossa, supra vermian cistern, left CP angle, and occipital/right parietal sulci. No infarct. Mild lateral ventriculomegaly attributed to volume loss. Vascular: Negative Skull: Right posterior scalp laceration and contusion without underlying calvarial fracture. Sinuses/Orbits: Bilateral cataract resection.  No acute finding Critical Value/emergent results were called by telephone at the time of interpretation on 07/14/2018 at 4:21 am to Dr. Marily Memos , who verbally acknowledged these results. CT CERVICAL SPINE FINDINGS Alignment:  Normal Skull base and vertebrae: Negative for fracture. Dysmorphic upper  cervical posterior elements, incidental. Soft tissues and spinal canal: No prevertebral fluid or swelling. No visible canal hematoma. A contusion is partially covered superficial to the right scapula, reference chest CT. Disc levels:  No evidence of degenerative impingement Upper chest: Reported separately IMPRESSION: 1. Acute subdural hematoma along the left cerebral convexity measuring up to 11 mm in thickness. Mild cerebral mass effect. 2. Patchy subarachnoid hemorrhage with a traumatic pattern. 3. Negative for cervical spine fracture. Electronically Signed   By: Marnee Spring M.D.   On: 07/14/2018 04:24   Ct Chest W Contrast  Result Date: 07/14/2018 CLINICAL DATA:  Unwitnessed fall with back pain EXAM: CT CHEST, ABDOMEN, AND PELVIS WITH CONTRAST TECHNIQUE: Multidetector CT imaging of the chest, abdomen and pelvis was performed following the standard protocol during bolus administration of intravenous contrast. CONTRAST:  OMNIPAQUE IOHEXOL 300 MG/ML  SOLN COMPARISON:  None. FINDINGS: CT CHEST FINDINGS Cardiovascular: Normal heart size. No pericardial effusion. Left and right coronary circulation atherosclerotic calcification. Aortic calcification. Mediastinum/Nodes: Negative for pneumomediastinum or hematoma. Lungs/Pleura: Mild subpleural hemorrhage in the posterior right chest associated with rib fractures. No hemothorax, lung contusion, or pneumothorax. There are scattered calcified pulmonary nodules consistent with remote granulomatous disease. Musculoskeletal: Posterior right sixth and seventh rib fractures. The seventh rib fracture is displaced. The cartilage of the right seventh rib is also fractured anteriorly. A subcutaneous contusion is seen over the posterior and right-sided chest wall. CT ABDOMEN PELVIS FINDINGS Hepatobiliary: No hepatic injury or perihepatic hematoma. Gallbladder is unremarkable Pancreas: Negative  Spleen: Negative Adrenals/Urinary Tract: No adrenal hemorrhage or renal injury identified. Bladder is unremarkable. Stomach/Bowel: No evidence of injury. Colonic diverticulosis that is generalized Vascular/Lymphatic: Atherosclerotic calcification Reproductive: Large partially covered hydrocele on the left. Hydrocele is also seen on the right, extending into the inguinal canal. Other: No ascites or pneumoperitoneum Musculoskeletal: Subcutaneous contusion involving the right more than left posterior flank. No associated fracture. IMPRESSION: 1. Posterior right sixth and seventh rib fractures with seventh rib fracture displacement. There is also seventh rib cartilage fracture anteriorly. 2. Soft tissue contusions of the back. 3. No evidence of intra-abdominal injury. Electronically Signed   By: Marnee Spring M.D.   On: 07/14/2018 04:36   Ct Cervical Spine Wo Contrast  Result Date: 07/14/2018 CLINICAL DATA:  Unwitnessed fall with back and neck pain. Posterior head laceration. EXAM: CT HEAD WITHOUT CONTRAST CT CERVICAL SPINE WITHOUT CONTRAST TECHNIQUE: Multidetector CT imaging of the head and cervical spine was performed following the standard protocol without intravenous contrast. Multiplanar CT image reconstructions of the cervical spine were also generated. COMPARISON:  None. FINDINGS: CT HEAD FINDINGS Brain: Crescentic subdural hematoma along the left cerebral convexity measuring up to 11 mm in thickness. There is associated cerebral mass effect. No measurable shift on axial slices. Patchy subarachnoid hemorrhage seen in the interpeduncular fossa, supra vermian cistern, left CP angle, and occipital/right parietal sulci. No infarct. Mild lateral ventriculomegaly attributed to volume loss. Vascular: Negative Skull: Right posterior scalp laceration and contusion without underlying calvarial fracture. Sinuses/Orbits: Bilateral cataract resection.  No acute finding Critical Value/emergent results were called by  telephone at the time of interpretation on 07/14/2018 at 4:21 am to Dr. Marily Memos , who verbally acknowledged these results. CT CERVICAL SPINE FINDINGS Alignment: Normal Skull base and vertebrae: Negative for fracture. Dysmorphic upper cervical posterior elements, incidental. Soft tissues and spinal canal: No prevertebral fluid or swelling. No visible canal hematoma. A contusion is partially covered superficial to the right scapula, reference chest CT. Disc levels:  No evidence of degenerative impingement Upper chest: Reported separately IMPRESSION: 1. Acute subdural hematoma along the left cerebral convexity measuring up to 11 mm in thickness. Mild cerebral mass effect. 2. Patchy subarachnoid hemorrhage with a traumatic pattern. 3. Negative for cervical spine fracture. Electronically Signed   By: Marnee SpringJonathon  Watts M.D.   On: 07/14/2018 04:24   Ct Abdomen Pelvis W Contrast  Result Date: 07/14/2018 CLINICAL DATA:  Unwitnessed fall with back pain EXAM: CT CHEST, ABDOMEN, AND PELVIS WITH CONTRAST TECHNIQUE: Multidetector CT imaging of the chest, abdomen and pelvis was performed following the standard protocol during bolus administration of intravenous contrast. CONTRAST:  125mL OMNIPAQUE IOHEXOL 300 MG/ML  SOLN COMPARISON:  None. FINDINGS: CT CHEST FINDINGS Cardiovascular: Normal heart size. No pericardial effusion. Left and right coronary circulation atherosclerotic calcification. Aortic calcification. Mediastinum/Nodes: Negative for pneumomediastinum or hematoma. Lungs/Pleura: Mild subpleural hemorrhage in the posterior right chest associated with rib fractures. No hemothorax, lung contusion, or pneumothorax. There are scattered calcified pulmonary nodules consistent with remote granulomatous disease. Musculoskeletal: Posterior right sixth and seventh rib fractures. The seventh rib fracture is displaced. The cartilage of the right seventh rib is also fractured anteriorly. A subcutaneous contusion is seen over the  posterior and right-sided chest wall. CT ABDOMEN PELVIS FINDINGS Hepatobiliary: No hepatic injury or perihepatic hematoma. Gallbladder is unremarkable Pancreas: Negative Spleen: Negative Adrenals/Urinary Tract: No adrenal hemorrhage or renal injury identified. Bladder is unremarkable. Stomach/Bowel: No evidence of injury. Colonic diverticulosis that is generalized Vascular/Lymphatic: Atherosclerotic calcification Reproductive: Large partially covered hydrocele on the left. Hydrocele is also seen on the right, extending into the inguinal canal. Other: No ascites or pneumoperitoneum Musculoskeletal: Subcutaneous contusion involving the right more than left posterior flank. No associated fracture. IMPRESSION: 1. Posterior right sixth and seventh rib fractures with seventh rib fracture displacement. There is also seventh rib cartilage fracture anteriorly. 2. Soft tissue contusions of the back. 3. No evidence of intra-abdominal injury. Electronically Signed   By: Marnee SpringJonathon  Watts M.D.   On: 07/14/2018 04:36   Dg Pelvis Portable  Result Date: 07/14/2018 CLINICAL DATA:  Unwitnessed fall EXAM: PORTABLE PELVIS 1-2 VIEWS COMPARISON:  None. FINDINGS: There is no evidence of pelvic fracture or diastasis. No pelvic bone lesions are seen. IMPRESSION: Negative. Electronically Signed   By: Charlett NoseKevin  Dover M.D.   On: 07/14/2018 02:14   Dg Chest Portable 1 View  Result Date: 07/14/2018 CLINICAL DATA:  Unwitnessed fall.  Back pain EXAM: PORTABLE CHEST 1 VIEW COMPARISON:  12/02/2006 FINDINGS: Low lung volumes, bibasilar atelectasis. Heart is borderline in size. No visible effusions or pneumothorax. Posterior right 7th rib fracture. IMPRESSION: Low lung volumes, bibasilar atelectasis. Posterior right 7th rib fracture.  No pneumothorax. Electronically Signed   By: Charlett NoseKevin  Dover M.D.   On: 07/14/2018 02:15   Assessment/Plan: [age] M s/p fall on anticoagulation SDH SAH R 6-7 ribs fx Pt improving and CTH stable today Will start  diet Mobilize with PT Holding anticoagulation Likely to 4NP tomorrow if con't to be stable.   LOS: 1 day    Axel Fillerrmando Chrisopher Pustejovsky 07/15/2018

## 2018-07-15 NOTE — Progress Notes (Signed)
Pt's wife Stark Bray updated over phone by me, and she spoke to her husband over the phone for several minutes.

## 2018-07-16 LAB — GLUCOSE, CAPILLARY
Glucose-Capillary: 123 mg/dL — ABNORMAL HIGH (ref 70–99)
Glucose-Capillary: 214 mg/dL — ABNORMAL HIGH (ref 70–99)
Glucose-Capillary: 250 mg/dL — ABNORMAL HIGH (ref 70–99)
Glucose-Capillary: 294 mg/dL — ABNORMAL HIGH (ref 70–99)

## 2018-07-16 LAB — BASIC METABOLIC PANEL
Anion gap: 13 (ref 5–15)
BUN: 21 mg/dL (ref 8–23)
CO2: 18 mmol/L — ABNORMAL LOW (ref 22–32)
Calcium: 8.2 mg/dL — ABNORMAL LOW (ref 8.9–10.3)
Chloride: 106 mmol/L (ref 98–111)
Creatinine, Ser: 0.79 mg/dL (ref 0.61–1.24)
GFR calc Af Amer: >60 mL/min (ref >60)
GFR calc non Af Amer: >60 mL/min (ref >60)
Glucose, Bld: 124 mg/dL — ABNORMAL HIGH (ref 70–99)
Potassium: 4.4 mmol/L (ref 3.5–5.1)
Sodium: 137 mmol/L (ref 135–145)

## 2018-07-16 LAB — CBC
HCT: 37.3 % — ABNORMAL LOW (ref 39.0–52.0)
Hemoglobin: 12.4 g/dL — ABNORMAL LOW (ref 13.0–17.0)
MCH: 29.8 pg (ref 26.0–34.0)
MCHC: 33.2 g/dL (ref 30.0–36.0)
MCV: 89.7 fL (ref 80.0–100.0)
Platelets: 159 10*3/uL (ref 150–400)
RBC: 4.16 MIL/uL — ABNORMAL LOW (ref 4.22–5.81)
RDW: 13.5 % (ref 11.5–15.5)
WBC: 7.2 10*3/uL (ref 4.0–10.5)
nRBC: 0 % (ref 0.0–0.2)

## 2018-07-16 MED ORDER — METHOCARBAMOL 500 MG PO TABS
500.0000 mg | ORAL_TABLET | Freq: Three times a day (TID) | ORAL | Status: DC
  Administered 2018-07-16 – 2018-07-17 (×4): 500 mg via ORAL
  Filled 2018-07-16 (×4): qty 1

## 2018-07-16 MED ORDER — INSULIN ASPART 100 UNIT/ML ~~LOC~~ SOLN
0.0000 [IU] | Freq: Every day | SUBCUTANEOUS | Status: DC
  Administered 2018-07-16: 3 [IU] via SUBCUTANEOUS
  Administered 2018-07-19 – 2018-07-20 (×2): 2 [IU] via SUBCUTANEOUS
  Administered 2018-07-21: 3 [IU] via SUBCUTANEOUS

## 2018-07-16 MED ORDER — INSULIN ASPART 100 UNIT/ML ~~LOC~~ SOLN
0.0000 [IU] | Freq: Three times a day (TID) | SUBCUTANEOUS | Status: DC
  Administered 2018-07-17: 3 [IU] via SUBCUTANEOUS
  Administered 2018-07-17: 5 [IU] via SUBCUTANEOUS
  Administered 2018-07-17: 2 [IU] via SUBCUTANEOUS

## 2018-07-16 MED ORDER — INSULIN ASPART 100 UNIT/ML ~~LOC~~ SOLN: 0.0000 [IU] | Freq: Every day | SUBCUTANEOUS | Status: DC

## 2018-07-16 MED ORDER — DOCUSATE SODIUM 100 MG PO CAPS
100.0000 mg | ORAL_CAPSULE | Freq: Two times a day (BID) | ORAL | Status: DC
  Administered 2018-07-16 – 2018-07-22 (×13): 100 mg via ORAL
  Filled 2018-07-16 (×13): qty 1

## 2018-07-16 NOTE — Progress Notes (Signed)
[facility] Progress Note     Subjective: CC: Pain in ribs  Pain in ribs with movement or deep inspiration. Denies SOB. Denies HA, nausea, blurred vision. A&Ox4.   Objective: Vital signs in last 24 hours: Temp:  [97.9 F (36.6 C)-98.4 F (36.9 C)] 97.9 F (36.6 C) (05/06 0433) Pulse Rate:  [68-81] 68 (05/06 0433) Resp:  [17-28] 28 (05/05 2000) BP: (117-150)/(58-74) 144/67 (05/06 0433) SpO2:  [95 %-100 %] 97 % (05/06 0433) Last BM Date: (prior to admission)  Intake/Output from previous day: 05/05 0701 - 05/06 0700 In: -  Out: 3200 [Urine:3200] Intake/Output this shift: No intake/output data recorded.  PE: Gen:  Alert, NAD, pleasant Card:  Regular rate and rhythm Pulm:  Normal effort, clear to auscultation bilaterally, pulled 750 on IS Abd: Soft, non-tender, non-distended, +BS Skin: warm and dry, no rashes  Neuro: A&Ox4, speech clear   Lab Results:  Recent Labs    07/14/18 0153  WBC 9.4  HGB 15.7  HCT 47.8  PLT 189   BMET Recent Labs    07/14/18 0153  NA 137  K 4.0  CL 104  CO2 22  GLUCOSE 283*  BUN 26*  CREATININE 1.13  CALCIUM 9.0   PT/INR No results for input(s): LABPROT, INR in the last 72 hours. CMP     Component Value Date/Time   NA 137 07/14/2018 0153   K 4.0 07/14/2018 0153   CL 104 07/14/2018 0153   CO2 22 07/14/2018 0153   GLUCOSE 283 (H) 07/14/2018 0153   BUN 26 (H) 07/14/2018 0153   CREATININE 1.13 07/14/2018 0153   CALCIUM 9.0 07/14/2018 0153   PROT 6.5 07/14/2018 0153   ALBUMIN 4.1 07/14/2018 0153   AST 36 07/14/2018 0153   ALT 41 07/14/2018 0153   ALKPHOS 48 07/14/2018 0153   BILITOT 0.8 07/14/2018 0153   GFRNONAA >60 07/14/2018 0153   GFRAA >60 07/14/2018 0153   Lipase  No results found for: LIPASE     Studies/Results: Ct Head Wo Contrast  Result Date: 07/15/2018 CLINICAL DATA:  68 y/o  M; subdural hematoma. EXAM: CT HEAD WITHOUT CONTRAST TECHNIQUE: Contiguous axial images were obtained from the base of  the skull through the vertex without intravenous contrast. COMPARISON:  07/14/2018 CT head FINDINGS: Brain: Left-sided subdural hematoma is overall stable in volume, with decreased greatest thickness to 8 mm with and increased settling of blood products over the posterior convexity. Stable small volume of subarachnoid and intraventricular hemorrhage with redistribution to the occipital horns of lateral ventricles. No new intracranial hemorrhage, mass effect, stroke, or herniation. Stable ventricle size. Vascular: No hyperdense vessel or unexpected calcification. Skull: Right posterior scalp contusion and laceration with skin staples. No calvarial fracture Sinuses/Orbits: No acute finding. Other: Bilateral intra-ocular lens replacement IMPRESSION: 1. Stable volume left-sided subdural hematoma with decreased greatest thickness to 8 mm and increased settling of blood products over the posterior convexity. No midline shift. 2. Stable small volume of subarachnoid and intraventricular hemorrhage with mild redistribution to occipital horns of lateral ventricles. Stable ventricle size. 3. No new acute intracranial abnormality. Electronically Signed   By: Mitzi HansenLance  Furusawa-Stratton M.D.   On: 07/15/2018 00:48    Anti-infectives: Anti-infectives (From admission, onward)   None       Assessment/Plan Fall SDH/SAH - f/u head CT yesterday stable, NS recommending another CT in 2-3 days, TBI therapies R 6-7 rib fractures - multimodal pain control, IS, pulm toilet On chronic anticoagulation - holding plavix/ASA for now HTN -  home meds T2DM - SSI  FEN: CM diet VTE: SCDs ID: no abx  Dispo: continue therapies, repeat labs. Will discuss when to restart plavix/ASA.   LOS: 2 days    Wells Guiles , [facility] 07/16/2018, 7:40 AM Pager: [phone]

## 2018-07-16 NOTE — TOC Initial Note (Signed)
Transition of Care [facility]) - Initial/Assessment Note    Patient Details  Name: [name] Frank MRN: [id number] Date of Birth: 08-Nov-1950  [facility] CM/SW Contact:    Glennon Mac, RN Phone Number: 07/16/2018, 4:26 PM  Clinical Narrative:  Patient is a 68 yo M s/p fall on anticoagulation with resultant SDH, SAH, R 6-7 ribs fx.  PTA, pt independent, lives with spouse.  PT/OT recommending OP follow up; wife able to provide assistance at dc.                  Expected Discharge Plan: OP Rehab Barriers to Discharge: Continued Medical Work up           Expected Discharge Plan and Services Expected Discharge Plan: OP Rehab   Discharge Planning Services: CM Consult   Living arrangements for the past 2 months: Single Family Home                                      Prior Living Arrangements/Services Living arrangements for the past 2 months: Single Family Home Lives with:: Spouse Patient language and need for interpreter reviewed:: Yes Do you feel safe going back to the place where you live?: Yes      Need for Family Participation in Patient Care: Yes (Comment) Care giver support system in place?: Yes (comment)   Criminal Activity/Legal Involvement Pertinent to Current Situation/Hospitalization: No - Comment as needed                 Emotional Assessment Appearance:: Appears stated age Attitude/Demeanor/Rapport: Engaged Affect (typically observed): Accepting, Appropriate Orientation: : Oriented to Self, Oriented to Place, Oriented to  Time, Oriented to Situation Alcohol / Substance Use: Not Applicable Psych Involvement: No (comment)  Admission diagnosis:  Subdural hematoma (HCC) [S06.5X9A] SAH (subarachnoid hemorrhage) (HCC) [I60.9] Closed fracture of multiple ribs of right side, initial encounter [S22.41XA] Patient Active Problem List   Diagnosis Date Noted  . Subdural hematoma (HCC) 07/14/2018  . Hypertension, essential 01/16/2018  .  Controlled type 2 diabetes mellitus without complication, without long-term current use of insulin (HCC) 01/16/2018  . Coronary artery disease involving native heart without angina pectoris 01/16/2018  . DIABETES MELLITUS, TYPE II 01/13/2011   PCP:  Pearline Cables, MD Pharmacy:   [facility] [address] [place], [place] - [address] [place] [address] Phone: [phone] Fax: [phone]  [facility] - [place], [place] - [address] [place] [address] Phone: [phone] Fax: [phone]        Readmission Risk Interventions No flowsheet data found.   Quintella Baton, RN, BSN  Trauma/Neuro ICU Case Manager [phone]

## 2018-07-16 NOTE — Progress Notes (Signed)
° °  Providing Compassionate, Quality Care - Together   Subjective: Patient reports no issues overnight. He denies weakness or paresthesias. Nurse reports serosanguinous drainage from laceration on patient's scalp. Patient reports working with speech therapy today. Patient is sitting up in bed and reading the [employer] upon assessment.  Objective: Vital signs in last 24 hours: Temp:  [97.9 F (36.6 C)-98.4 F (36.9 C)] 97.9 F (36.6 C) (05/06 0433) Pulse Rate:  [68-76] 72 (05/06 0830) Resp:  [21-28] 23 (05/06 0830) BP: (125-144)/(62-74) 131/62 (05/06 0830) SpO2:  [94 %-97 %] 94 % (05/06 0830)  Intake/Output from previous day: 05/05 0701 - 05/06 0700 In: -  Out: 3200 [Urine:3200] Intake/Output this shift: No intake/output data recorded.  Alert and Oriented x 4 GCS 15 MAE, Strength 5/5 BUE, BLE Laceration on scalp closed with staples; small amount of serosanguinous drainage  Lab Results: Recent Labs    07/14/18 0153 07/16/18 0816  WBC 9.4 7.2  HGB 15.7 12.4*  HCT 47.8 37.3*  PLT 189 159   BMET Recent Labs    07/14/18 0153 07/16/18 0816  NA 137 137  K 4.0 4.4  CL 104 106  CO2 22 18*  GLUCOSE 283* 124*  BUN 26* 21  CREATININE 1.13 0.79  CALCIUM 9.0 8.2*    Studies/Results: Ct Head Wo Contrast  Result Date: 07/15/2018 CLINICAL DATA:  68 y/o  M; subdural hematoma. EXAM: CT HEAD WITHOUT CONTRAST TECHNIQUE: Contiguous axial images were obtained from the base of the skull through the vertex without intravenous contrast. COMPARISON:  07/14/2018 CT head FINDINGS: Brain: Left-sided subdural hematoma is overall stable in volume, with decreased greatest thickness to 8 mm with and increased settling of blood products over the posterior convexity. Stable small volume of subarachnoid and intraventricular hemorrhage with redistribution to the occipital horns of lateral ventricles. No new intracranial hemorrhage, mass effect, stroke, or herniation. Stable ventricle size.  Vascular: No hyperdense vessel or unexpected calcification. Skull: Right posterior scalp contusion and laceration with skin staples. No calvarial fracture Sinuses/Orbits: No acute finding. Other: Bilateral intra-ocular lens replacement IMPRESSION: 1. Stable volume left-sided subdural hematoma with decreased greatest thickness to 8 mm and increased settling of blood products over the posterior convexity. No midline shift. 2. Stable small volume of subarachnoid and intraventricular hemorrhage with mild redistribution to occipital horns of lateral ventricles. Stable ventricle size. 3. No new acute intracranial abnormality. Electronically Signed   By: Mitzi Hansen M.D.   On: 07/15/2018 00:48    Assessment/Plan: Mr. Hunsaker is 2 days status post fall from standing height. It is unknown if he lost consciousness. He sustained a small left convexity subdural hematoma and two rib fractures. He remains amnestic to the event. Follow up head CT on 5/5 was stable.   LOS: 2 days    -Follow up head CT tomorrow, 07/17/2018 -Continue to mobilize -Continue to hold ASA/Plavix   Val Eagle, DNP, AGNP-C Nurse Practitioner  [facility] [address], [place], [place] [address] P: [phone]     F: [phone]  07/16/2018, 3:09 PM

## 2018-07-16 NOTE — Evaluation (Signed)
Speech Language Pathology Evaluation Patient Details Name: Shari HeritageDonald G Hefel MRN: [id number] DOB: 1950-12-17 Today's Date: 07/16/2018 Time: [id number] SLP Time Calculation (min) (ACUTE ONLY): 41 min  Problem List:  Patient Active Problem List   Diagnosis Date Noted  . Subdural hematoma (HCC) 07/14/2018  . Hypertension, essential 01/16/2018  . Controlled type 2 diabetes mellitus without complication, without long-term current use of insulin (HCC) 01/16/2018  . Coronary artery disease involving native heart without angina pectoris 01/16/2018  . DIABETES MELLITUS, TYPE II 01/13/2011   Past Medical History:  Past Medical History:  Diagnosis Date  . Diabetes mellitus   . Heart disease   . Hyperlipemia   . Hypertension    Past Surgical History:  Past Surgical History:  Procedure Laterality Date  . catarracts    . OTHER SURGICAL HISTORY  2016   HEART STENT  . TONSILLECTOMY     HPI:  Pt is a 68 year-old male who presented after an unwitnessed fall at home, possibly down a few steps.  He was found by his wife and was brought in by EMS with a laceration on the back of his scalp and questionable LOC. CT of the head showed acute subdural hematoma along the left cerebral convexity measuring up to 11 mm in thickness. Mild cerebral mass effect. Patchy subarachnoid hemorrhage with a traumatic pattern.   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 He stated that he holds an [employer] in Industrial/product designerAviation Management and he denied any baseline deficits in speech, language, or cognition. Per the pt his speech is currently at baseline but it now takes him more time to explain complex information and his "thinking" is now slower. No deficits in motor speech function were demonstrated but additional processing time was intermittently required for word retrieval during  conversation. [facility] was completed to evaluate the pt's cognitive-linguistic skills. He achieved a score of 23/30 which is below the normal limits of 26 or more out of 30 and is suggestive of a mild impairment. He demonstrated deficits in the areas of attention, mental manipulation, divergent naming, and delayed recall. Skilled SLP services are clinically indicated at this time to improve pt's cognitive-linguistic skills. Pt, and nursing were educated regarding results and recommendations; both parties verbalized understanding as well as agreement with plan of care.    SLP Assessment  SLP Recommendation/Assessment: Patient needs continued Speech Lanaguage Pathology Services SLP Visit Diagnosis: Cognitive communication deficit (R41.841)    Follow Up Recommendations  Outpatient SLP    Frequency and Duration min 2x/week  2 weeks      SLP Evaluation Cognition  Overall Cognitive Status: Impaired/Different from baseline Arousal/Alertness: Awake/alert Orientation Level: Oriented to person;Oriented to place;Oriented to situation;Disoriented to time(Oriented to all time areas except date. ) Attention: Focused;Sustained Focused Attention: Appears intact(Vigilance WNL: 1/1) Sustained Attention: Impaired Sustained Attention Impairment: Verbal complex(Serial 7s: 2/3) Memory: Impaired Memory Impairment: Retrieval deficit;Decreased recall of new information(Immediate: 4/5; Delayed: 1/5; with cues: 4/4) Awareness: Appears intact Problem Solving: Appears intact(5/5) Executive Function: Reasoning;Sequencing;Organizing Reasoning: Appears intact(Abstraction: 2/2) Sequencing: Appears intact(Clock drawing: 3/3) Organizing: Appears intact(Back ward digit span: 1/1)       Comprehension  Auditory Comprehension Overall Auditory Comprehension: Appears within functional limits for tasks assessed Yes/No Questions: Within Functional Limits Commands: Within Functional  Limits Complex Commands: (Trail completion: 1/1) Conversation: Complex Visual Recognition/Discrimination Discrimination: Within Function Limits  Expression Expression Primary Mode of Expression: Verbal Verbal Expression Overall Verbal Expression: Appears within functional limits for tasks assessed Initiation: No impairment Level of Generative/Spontaneous Verbalization: Conversation Repetition: No impairment(Sentence: 2/2) Naming: Impairment Responsive: Not tested Confrontation: Within functional limits(3/3) Convergent: Not tested Divergent: (0/1) Pragmatics: No impairment Written Expression Dominant Hand: Right Written Expression: (Clock drawing: 1/1)   Oral / Motor  Oral Motor/Sensory Function Overall Oral Motor/Sensory Function: Within functional limits Motor Speech Overall Motor Speech: Appears within functional limits for tasks assessed Respiration: Within functional limits Phonation: Normal Resonance: Within functional limits Articulation: Within functional limitis Intelligibility: Intelligible Motor Planning: Witnin functional limits Motor Speech Errors: Not applicable   Hazell Siwik I. Vear Clock, MS, CCC-SLP Acute Rehabilitation Services Office number [phone] Pager [phone]                   Scheryl Marten 07/16/2018, 10:38 AM

## 2018-07-17 ENCOUNTER — Ambulatory Visit: Payer: Medicare HMO | Admitting: Family Medicine

## 2018-07-17 ENCOUNTER — Inpatient Hospital Stay (HOSPITAL_COMMUNITY): Payer: Medicare HMO

## 2018-07-17 LAB — GLUCOSE, CAPILLARY
Glucose-Capillary: 160 mg/dL — ABNORMAL HIGH (ref 70–99)
Glucose-Capillary: 260 mg/dL — ABNORMAL HIGH (ref 70–99)
Glucose-Capillary: 213 mg/dL — ABNORMAL HIGH (ref 70–99)
Glucose-Capillary: 200 mg/dL — ABNORMAL HIGH (ref 70–99)

## 2018-07-17 IMAGING — CT CT HEAD WITHOUT CONTRAST
4 series · 17 of 47 positions shown, 19 images · non-contrast
Comparison: [DATE]

CLINICAL DATA: Followup subdural hematoma

EXAM:
CT HEAD WITHOUT CONTRAST
TECHNIQUE: Contiguous axial images were obtained from the base of the skull
through the vertex without intravenous contrast.

[Series 3: head without · axial · non-contrast · 0.45mm/px · z∈[-74,+76]mm · 7 of 40 slices shown, 9 images]
[im 5/40  brain]
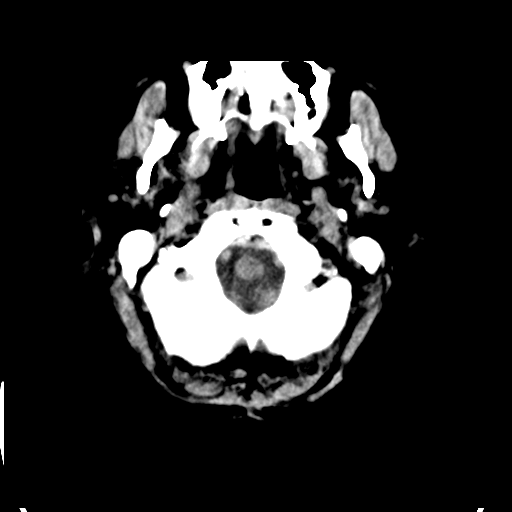
[im 5/40  bone]
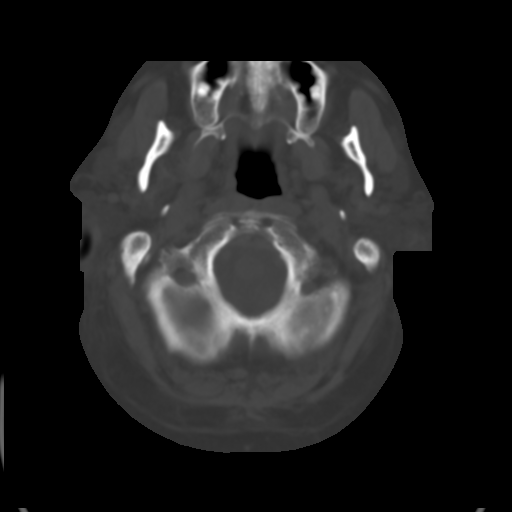
[im 10/40  brain]
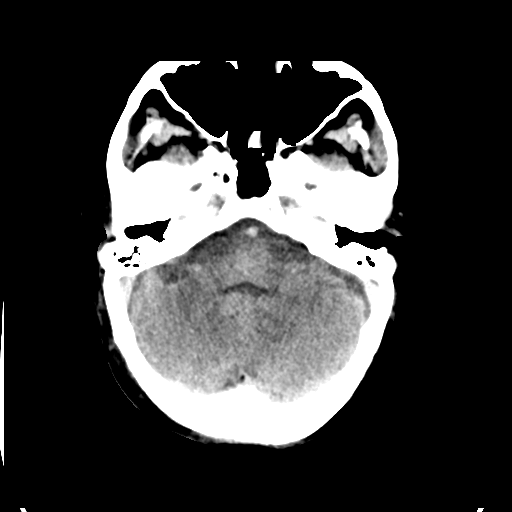
[im 15/40  brain]
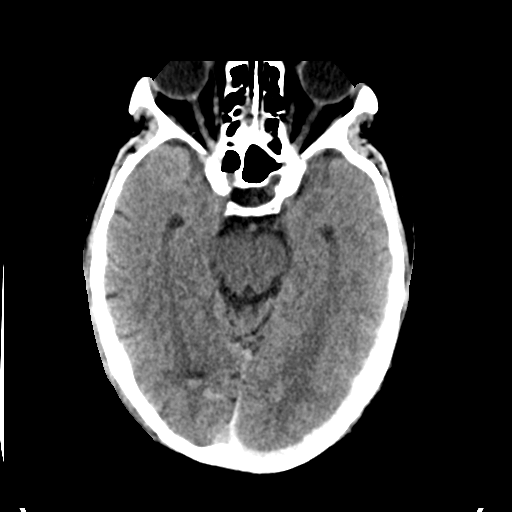
[im 20/40  brain]
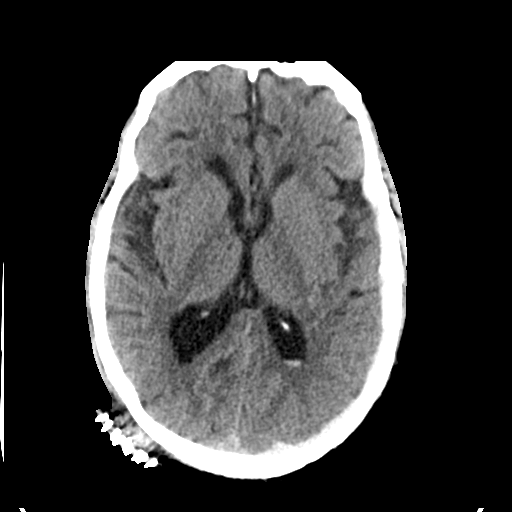
[im 25/40  brain]
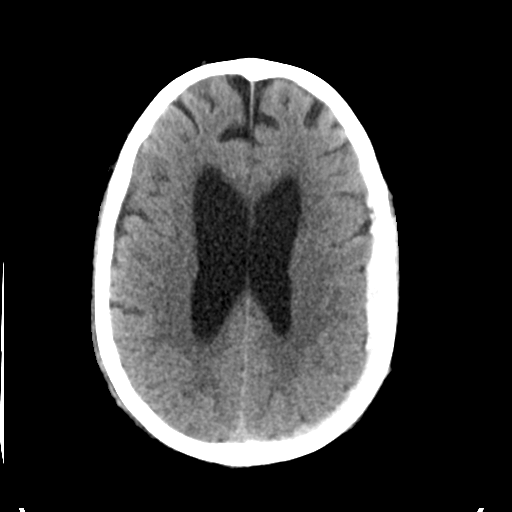
[im 25/40  bone]
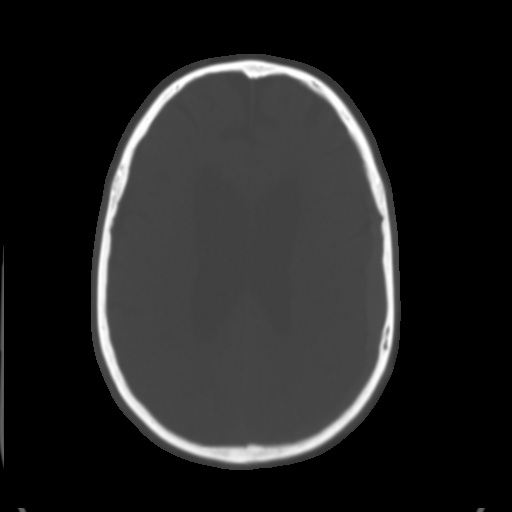
[im 30/40  brain]
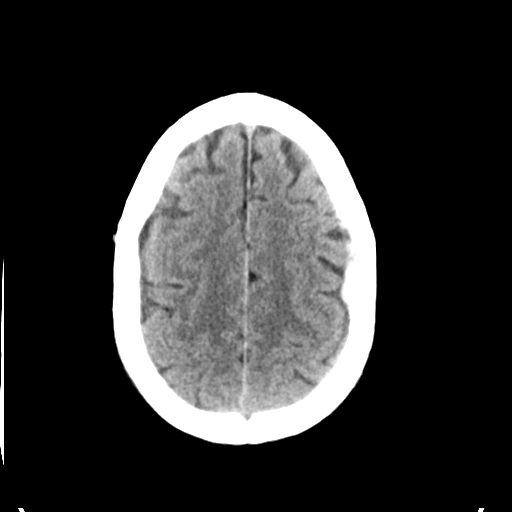
[im 35/40  brain]
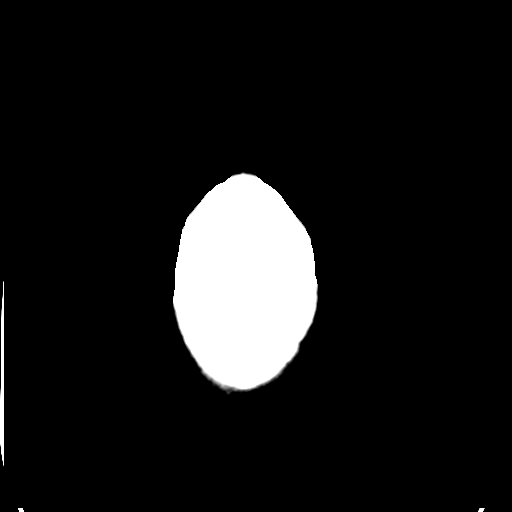

[Series 4: head bone · axial · 0.45mm/px · z∈[-76,-8]mm · 4 of 98 slices shown]
[im 10/98  bone]
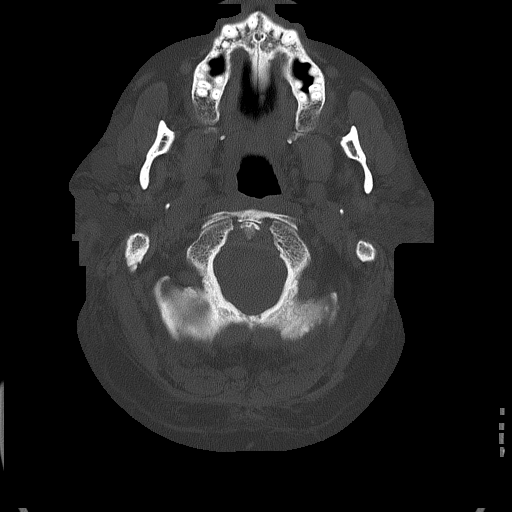
[im 20/98  bone]
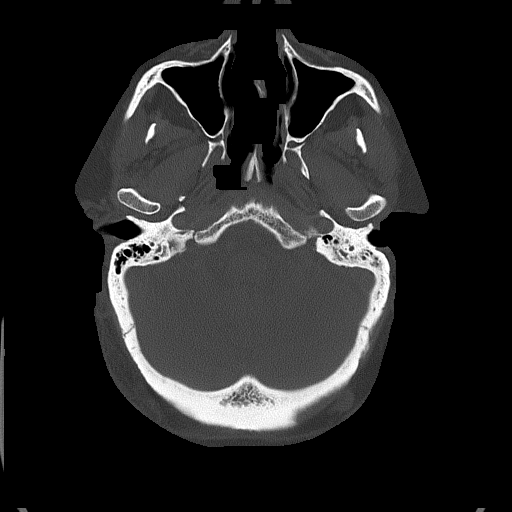
[im 30/98  bone]
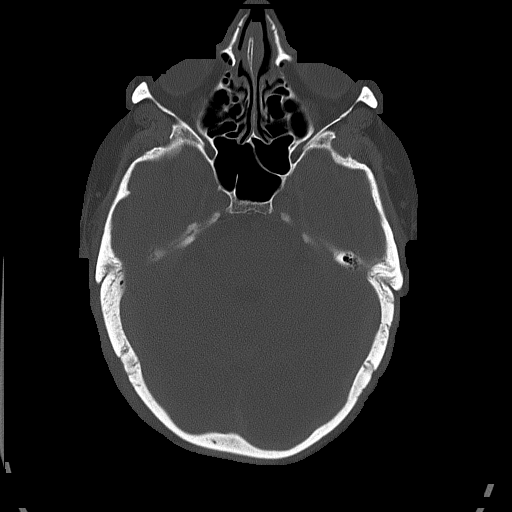
[im 44/98  bone]
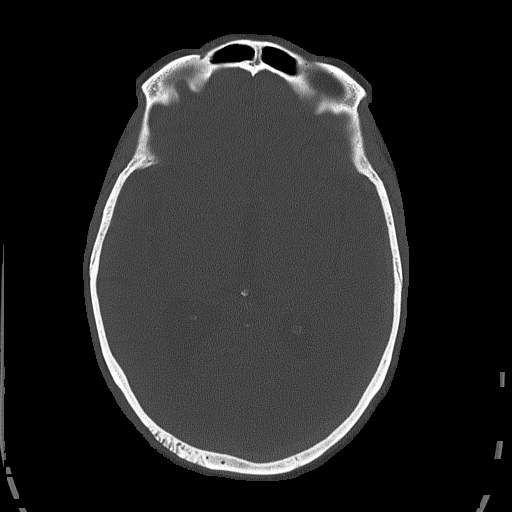

[Series 5: head without cor · coronal · non-contrast · 0.38mm/px · 3 of 80 slices shown]
[im 27/80  brain]
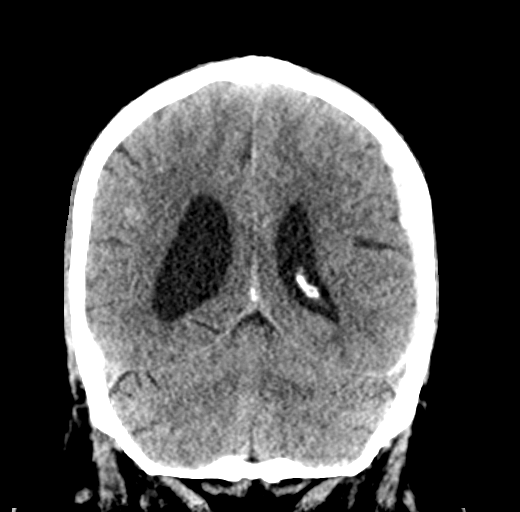
[im 36/80  brain]
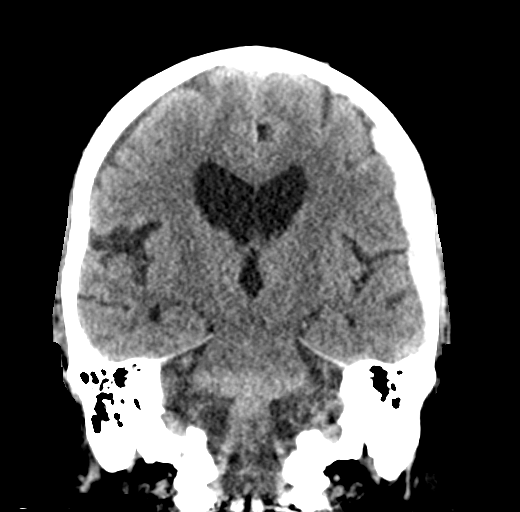
[im 44/80  brain]
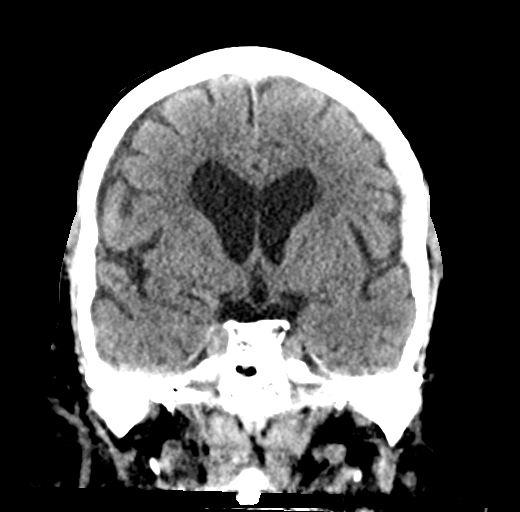

[Series 6: head without sag · sagittal · non-contrast · 0.38mm/px · 3 of 67 slices shown]
[im 23/67  brain]
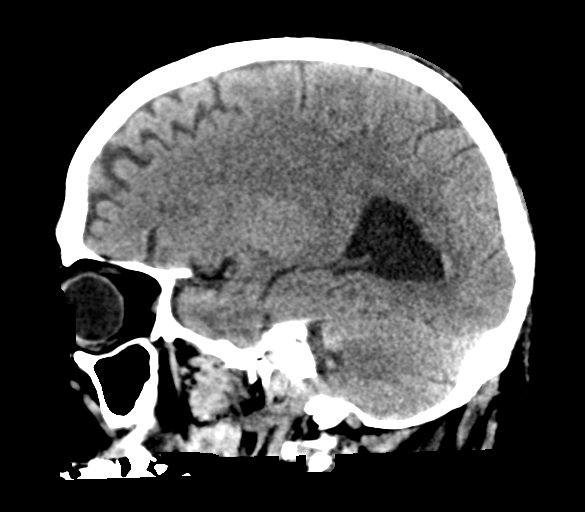
[im 34/67  brain]
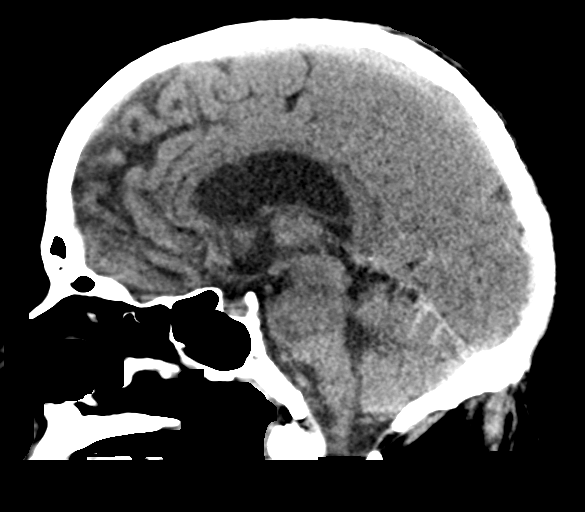
[im 45/67  brain]
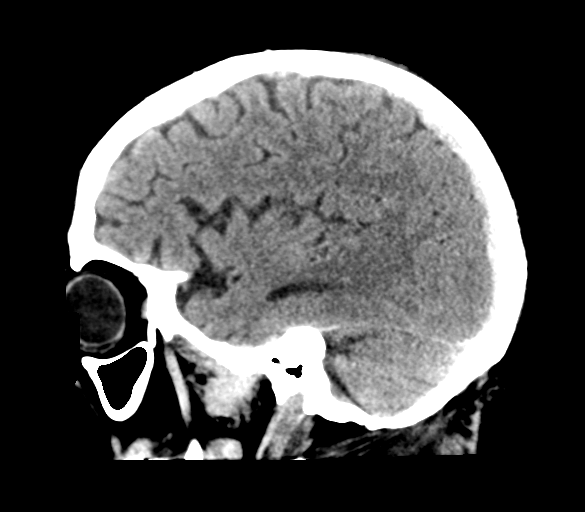

[17 of 47 positions shown; findings below may reference images not displayed]

FINDINGS: Brain: Left convexity subdural hematoma is not significantly
changed, 7-8 mm in thickness. Small amount of subarachnoid
hemorrhage remains evident within the sulci. Small amount of
layering intraventricular hemorrhage. Ventricular size remains
stable. Chronic small-vessel changes of the white matter as seen
previously. No new finding.

Vascular: No abnormal vascular finding.

Skull: No skull fracture.

Sinuses/Orbits: Clear/normal

Other: Right parietal scalp injury with staples.
IMPRESSION: No additional bleeding since the study of 2 days ago. Stable left
convexity subdural hematoma, 7-8 mm in thickness. No significant
mass-effect. Small amount of subarachnoid hemorrhage in the sulci.
Small amount of dependent intraventricular hemorrhage without change
in ventricular size.

## 2018-07-17 MED ORDER — METHOCARBAMOL 750 MG PO TABS
750.0000 mg | ORAL_TABLET | Freq: Three times a day (TID) | ORAL | Status: DC
  Administered 2018-07-17 – 2018-07-22 (×15): 750 mg via ORAL
  Filled 2018-07-17 (×15): qty 1

## 2018-07-17 MED ORDER — OXYCODONE HCL 5 MG PO TABS: 5.0000 mg | ORAL_TABLET | ORAL | Status: DC | PRN

## 2018-07-17 MED ORDER — GABAPENTIN 600 MG PO TABS: 300.0000 mg | ORAL_TABLET | Freq: Three times a day (TID) | ORAL | Status: DC

## 2018-07-17 MED ORDER — TRAMADOL HCL 50 MG PO TABS
50.0000 mg | ORAL_TABLET | Freq: Four times a day (QID) | ORAL | Status: DC | PRN
  Administered 2018-07-17: 100 mg via ORAL
  Administered 2018-07-18: 50 mg via ORAL
  Administered 2018-07-18 – 2018-07-21 (×4): 100 mg via ORAL
  Administered 2018-07-21: 50 mg via ORAL
  Filled 2018-07-17: qty 2
  Filled 2018-07-17: qty 1
  Filled 2018-07-17 (×5): qty 2

## 2018-07-17 MED ORDER — GABAPENTIN 300 MG PO CAPS
300.0000 mg | ORAL_CAPSULE | Freq: Three times a day (TID) | ORAL | Status: DC
  Administered 2018-07-17 – 2018-07-22 (×15): 300 mg via ORAL
  Filled 2018-07-17 (×15): qty 1

## 2018-07-17 MED ORDER — ACETAMINOPHEN 325 MG PO TABS
650.0000 mg | ORAL_TABLET | Freq: Four times a day (QID) | ORAL | Status: DC
  Administered 2018-07-17 – 2018-07-22 (×19): 650 mg via ORAL
  Filled 2018-07-17 (×19): qty 2

## 2018-07-17 MED ORDER — POLYETHYLENE GLYCOL 3350 17 G PO PACK
17.0000 g | PACK | Freq: Every day | ORAL | Status: DC
  Administered 2018-07-17 – 2018-07-22 (×5): 17 g via ORAL
  Filled 2018-07-17 (×6): qty 1

## 2018-07-17 MED ORDER — GUAIFENESIN ER 600 MG PO TB12
600.0000 mg | ORAL_TABLET | Freq: Two times a day (BID) | ORAL | Status: DC
  Administered 2018-07-17 – 2018-07-22 (×11): 600 mg via ORAL
  Filled 2018-07-17 (×12): qty 1

## 2018-07-17 NOTE — Care Management Important Message (Signed)
Important Message  Patient Details  Name: [name] Frank MRN: [id number] Date of Birth: Mar 10, 1951   Medicare Important Message Given:  Yes    [name] 07/17/2018, 1:50 PM

## 2018-07-17 NOTE — Progress Notes (Signed)
Physical Therapy Treatment Patient Details Name: [name] Frank MRN: [id number] DOB: 09-Mar-1951 Today's Date: 07/17/2018    History of Present Illness Patient is a 68 yo M s/p fall on anticoagulation with resultant SDH, SAH, R 6-7 ribs fx    PT Comments    Patient is progressing very well towards their physical therapy goals. Ambulating limited hallway distances with increased independence using walker. Able to climb 5 steps with right railing and min assist to simulate home entrance. Based on pt performance, advised both pt and pt spouse that he not climb the flight to his 2nd floor bedroom currently. Pt wife says she has moved a bed downstairs for his discharge home. POC remains appropriate.     Follow Up Recommendations  Outpatient PT;Supervision/Assistance - 24 hour     Equipment Recommendations  Rolling walker with 5" wheels;3in1 (PT)    Recommendations for Other Services       Precautions / Restrictions Precautions Precautions: Fall Restrictions Weight Bearing Restrictions: No    Mobility  Bed Mobility               General bed mobility comments: Received in bathroom with RN  Transfers Overall transfer level: Needs assistance Equipment used: Rolling walker (2 wheeled) Transfers: Sit to/from Stand Sit to Stand: Min assist         General transfer comment: min assist from low toilet surface  Ambulation/Gait Ambulation/Gait assistance: Min guard Gait Distance (Feet): 100 Feet Assistive device: Rolling walker (2 wheeled) Gait Pattern/deviations: Step-through pattern;Decreased dorsiflexion - right;Decreased dorsiflexion - left;Trunk flexed Gait velocity: decreased   General Gait Details: decreased bilateral heel strike at initial contact. cues for increased foot clearance   Stairs Stairs: Yes Stairs assistance: Min assist Stair Management: One rail Right Number of Stairs: 5 General stair comments: cues for sequencing (up with left foot, down with right  foot). step by step sequencing   Wheelchair Mobility    Modified Rankin (Stroke Patients Only) Modified Rankin (Stroke Patients Only) Pre-Morbid Rankin Score: No symptoms Modified Rankin: Moderately severe disability     Balance Overall balance assessment: Needs assistance;History of Falls Sitting-balance support: Feet supported Sitting balance-Leahy Scale: Good     Standing balance support: No upper extremity supported Standing balance-Leahy Scale: Fair Standing balance comment: able to stand statically with supervision to wash hands                             Cognition Arousal/Alertness: Awake/alert Behavior During Therapy: WFL for tasks assessed/performed Overall Cognitive Status: Impaired/Different from baseline Area of Impairment: Attention;Memory;Following commands;Awareness;Problem solving;Rancho level               Rancho Levels of Cognitive Functioning Rancho Los Amigos Scales of Cognitive Functioning: Automatic/appropriate   Current Attention Level: Selective Memory: Decreased short-term memory Following Commands: Follows one step commands consistently Safety/Judgement: Decreased awareness of safety;Decreased awareness of deficits Awareness: Emergent Problem Solving: Requires verbal cues;Requires tactile cues General Comments: Pt with emerging recognition/awareness of deficits. Cues for problem solving       Exercises      General Comments        Pertinent Vitals/Pain Pain Assessment: Faces Pain Score: 0-No pain Pain Location: mid thoracic Pain Descriptors / Indicators: Aching;Grimacing;Guarding Pain Intervention(s): Monitored during session    Home Living                      Prior Function  PT Goals (current goals can now be found in the care plan section) Acute Rehab PT Goals Patient Stated Goal: to go back to work  PT Goal Formulation: With patient Time For Goal Achievement: 07/29/18 Potential to  Achieve Goals: Good Progress towards PT goals: Progressing toward goals    Frequency    Min 3X/week      PT Plan Current plan remains appropriate    Co-evaluation              AM-PAC PT "6 Clicks" Mobility   Outcome Measure  Help needed turning from your back to your side while in a flat bed without using bedrails?: A Lot Help needed moving from lying on your back to sitting on the side of a flat bed without using bedrails?: A Little Help needed moving to and from a bed to a chair (including a wheelchair)?: A Little Help needed standing up from a chair using your arms (e.g., wheelchair or bedside chair)?: A Little Help needed to walk in hospital room?: A Little Help needed climbing 3-5 steps with a railing? : A Little 6 Click Score: 17    End of Session Equipment Utilized During Treatment: Gait belt Activity Tolerance: Patient tolerated treatment well Patient left: in chair;with call bell/phone within reach;with chair alarm set Nurse Communication: Mobility status PT Visit Diagnosis: Unsteadiness on feet (R26.81);Difficulty in walking, not elsewhere classified (R26.2);Other symptoms and signs involving the nervous system (R29.898)     Time: [id number] PT Time Calculation (min) (ACUTE ONLY): 21 min  Charges:  $Gait Training: 8-22 mins                     Laurina Bustlearoline Carle Dargan, [place]PT, DPT Acute Rehabilitation Services Pager [phone] Office [phone]    Vanetta MuldersCarloine H Yachet Mattson 07/17/2018, 1:26 PM

## 2018-07-17 NOTE — Progress Notes (Signed)
Results for JIA, MALVIN (MRN [id number]) as of 07/17/2018 13:37  Ref. Range 07/16/2018 13:33 07/16/2018 17:19 07/16/2018 21:23 07/17/2018 07:08 07/17/2018 12:39  Glucose-Capillary Latest Ref Range: 70 - 99 mg/dL 352 (H) 481 (H) 859 (H) 160 (H) 260 (H)  Noted that postprandial blood sugars continue to be greater than 180 mg/dl.  Recommend changing Novolog correction scale to MODERATE (0-15 units) TID & HS scale if blood sugars continue to be elevated.  Patient's weight is 107.5 kg.  Smith Mince RN BSN CDE Diabetes Coordinator Pager: [phone]  8am-5pm

## 2018-07-17 NOTE — Progress Notes (Signed)
  Speech Language Pathology Treatment: Cognitive-Linquistic  Patient Details Name: [name] Frank MRN: [id number] DOB: Jul 15, 1950 Today's Date: 07/17/2018 Time: 4481-8563 SLP Time Calculation (min) (ACUTE ONLY): 34 min  Assessment / Plan / Recommendation Clinical Impression  Pt was seen for cognitive-linguistic treatment and was able to recall some information from yesterday's evalaution. He demonstrated 25% accuracy with delayed (3-minute) recall of 4 items increasing to 100% accuracy with min-mod cues. With recall of objective information from voicemails he achieved 70% accuracy increasing to 100% with min cues. He achieved 90% accuracy with time problems when additional processing time was provided increasing to 100% accuracy with min cues. With working memory tasks he demonstrated 70% accuracy increasing to 100% accuracy with mod. cues. SLP will continue to follow pt.    HPI HPI: Pt is a 68 year-old male who presented after an unwitnessed fall at home, possibly down a few steps.  He was found by his wife and was brought in by EMS with a laceration on the back of his scalp and questionable LOC. CT of the head showed acute subdural hematoma along the left cerebral convexity measuring up to 11 mm in thickness. Mild cerebral mass effect. Patchy subarachnoid hemorrhage with a traumatic pattern.      SLP Plan  Continue with current plan of care       Recommendations                   Follow up Recommendations: Outpatient SLP SLP Visit Diagnosis: Cognitive communication deficit (J49.702) Plan: Continue with current plan of care       [name] Shepard I. Vear Clock, MS, CCC-SLP Acute Rehabilitation Services Office number [phone] Pager [phone]               [name] Frank 07/17/2018, 10:34 AM

## 2018-07-17 NOTE — Progress Notes (Signed)
No significant changes overnight.  Follow-up head CT scan demonstrates thinning and redistribution of the subdural hematoma around his left convexity.  There remains no mass effect.  My hope this will continue to liquify and be absorbed by the body.  He is fine to be mobilized to have web and you may work towards discharge planning.  He needs to remain off Plavix for at least 4 weeks.  He needs to see me back in 2 weeks with a follow-up head CT scan.

## 2018-07-17 NOTE — Progress Notes (Signed)
Occupational Therapy Treatment Patient Details Name: [name] Frank MRN: [id number] DOB: [birth year] Today's Date: 07/17/2018    History of present illness Patient is a 68 yo M s/p fall on anticoagulation with resultant SDH, SAH, R 6-7 ribs fx   OT comments  Pt is progressing toward OT goals.  He continues with behaviors consistent with Ranchos level VII.   He requires assist for LB ADLs due to increased pain.  He will acknowledge cognitive deficits but consistently minimizes  Them and their impact.  Long discussion with him re: safety at home, need for 24 hour supervision, need for f/u OP therapies, and that he shouldn't even consider return to work for at least 6-8 weeks, and that it may not be an option if cognition doesn't improve adequately. Will continue to follow.  Will need to further assess vision as he reports the floor moves when he walks.    Follow Up Recommendations  Outpatient OT;Supervision/Assistance - 24 hour    Equipment Recommendations  None recommended by OT    Recommendations for Other Services      Precautions / Restrictions Precautions Precautions: Fall Restrictions Weight Bearing Restrictions: No       Mobility Bed Mobility               General bed mobility comments: Pt sitting up in recliner   Transfers Overall transfer level: Needs assistance Equipment used: Rolling walker (2 wheeled) Transfers: Sit to/from [employer] Sit to Stand: Min guard Stand pivot transfers: Min guard       General transfer comment: Pt moves slowly.  He requires min guard to move into standing     Balance Overall balance assessment: Needs assistance;History of Falls Sitting-balance support: Feet supported Sitting balance-Leahy Scale: Good     Standing balance support: No upper extremity supported Standing balance-Leahy Scale: Fair Standing balance comment: able to stand statically with supervision to wash hands                             ADL either performed or assessed with clinical judgement   ADL Overall ADL's : Needs assistance/impaired     Grooming: Wash/dry hands;Oral care;Wash/dry face;Brushing hair;Min guard;Standing   Upper Body Bathing: Set up;Supervision/ safety;Sitting   Lower Body Bathing: Moderate assistance;Sit to/from stand   Upper Body Dressing : Minimal assistance;Sitting   Lower Body Dressing: Maximal assistance;Sit to/from stand   Toilet Transfer: Minimal assistance;Ambulation;Comfort height toilet;Grab bars;RW   Toileting- Clothing Manipulation and Hygiene: Moderate assistance;Sit to/from stand       Functional mobility during ADLs: Rolling walker;Min guard General ADL Comments: Pt continues to be imited by pain.  Discussed wife assisting him with LB ADLs vs use of AE.  At present time, pt prefers wife to assist      Vision       Perception     Praxis      Cognition Arousal/Alertness: Awake/alert Behavior During Therapy: WFL for tasks assessed/performed Overall Cognitive Status: Impaired/Different from baseline Area of Impairment: Attention;Memory;Following commands;Awareness;Problem solving;Rancho level;Safety/judgement               Rancho Levels of Cognitive Functioning Rancho Los Amigos Scales of Cognitive Functioning: Automatic/appropriate   Current Attention Level: Selective Memory: Decreased short-term memory Following Commands: Follows one step commands consistently;Follows multi-step commands inconsistently Safety/Judgement: Decreased awareness of safety;Decreased awareness of deficits Awareness: Emergent Problem Solving: Requires verbal cues;Requires tactile cues General Comments: Pt fluctuates between intellectual awareness and  emergent awareness of deficits.  He is able to state how his rib fractures will impact him, but minimizes cognitive deficits and their impact on function other than "I need to be able to think and problem solve to do my job".  He was  able to execute a 3 step command with min cues, and was able to perform simple path finding to return to room.   He [facility], but was unable ot provide detailed info of any of the articles he read. He tends to provide generalities about current events he is knowledgable about         Exercises     Shoulder Instructions       General Comments Long discussion with pt re: need for OPOT, SLP, and PT as well as waiting at least 6-8 weeks before even considering return to work.  He did verbailze understanding     Pertinent Vitals/ Pain       Pain Assessment: Faces Faces Pain Scale: Hurts even more Pain Location: posterior ribs and mid thoracic back Pain Descriptors / Indicators: Aching;Grimacing;Guarding Pain Intervention(s): Monitored during session;Repositioned;Limited activity within patient's tolerance  Home Living                                          Prior Functioning/Environment              Frequency  Min 2X/week        Progress Toward Goals  OT Goals(current goals can now be found in the care plan section)  Progress towards OT goals: Progressing toward goals  Acute Rehab OT Goals Patient Stated Goal: to go back to work   Plan Discharge plan remains appropriate    Co-evaluation                 AM-PAC OT "6 Clicks" Daily Activity     Outcome Measure   Help from another person eating meals?: None Help from another person taking care of personal grooming?: A Little Help from another person toileting, which includes using toliet, bedpan, or urinal?: A Little Help from another person bathing (including washing, rinsing, drying)?: A Little Help from another person to put on and taking off regular upper body clothing?: A Little Help from another person to put on and taking off regular lower body clothing?: A Lot 6 Click Score: 18    End of Session Equipment Utilized During Treatment: Gait belt  OT Visit  Diagnosis: Unsteadiness on feet (R26.81);Cognitive communication deficit (R41.841)   Activity Tolerance Patient tolerated treatment well   Patient Left in chair;with call bell/phone within reach;with chair alarm set   Nurse Communication Mobility status        Time: [id number] OT Time Calculation (min): 42 min  Charges: OT General Charges $OT Visit: 1 Visit OT Treatments $Self Care/Home Management : 8-22 mins $Therapeutic Activity: 23-37 mins  Jeani HawkingWendi Syanna Remmert, OTR/L Acute Rehabilitation Services Pager [phone] Office [phone]    Jeani HawkingConarpe, Jshawn Hurta M 07/17/2018, 3:38 PM

## 2018-07-17 NOTE — Progress Notes (Addendum)
Patient has rib fractures which requires a hospital bed for frequent repositioning of upper body which is not feasible with a normal bed. Head of bed must be able to be elevated as needed to help with pain relief and encouraging better respirations.   [name] Frank , [facility] Surgery 07/17/2018, 1:32 PM Pager: [phone]

## 2018-07-17 NOTE — Progress Notes (Signed)
Paged on call doctor for Trauma service which was Dr. Esmond Harps regarding a necessary clarification of the insulin sliding scale coverage at bedtime and notified him of blood glucose 294. Order received and RN called pharmacy regarding the update.

## 2018-07-17 NOTE — TOC Progression Note (Signed)
Transition of Care [facility]) - Progression Note    Patient Details  Name: [name] Frank MRN: [id number] Date of Birth: August 21, 1950  Transition of Care [facility]) CM/SW Contact  Astrid Drafts Berna Spare, RN Phone Number: 07/17/2018, 4:43 PM  Clinical Narrative:   Patient is a 68 yo M s/p fall on anticoagulation with resultant SDH, SAH, R 6-7 ribs fx.  Pt reaching medical stability for discharge; will need OP therapies and DME for home.  Referral to [employer] [facility] for DME needs.  Referrals have been made to [facility] for OP PT/OT and ST.  Pt states wife can provide 24h care at dc.    SBIRT completed; pt denies ETOH use and need for resources.      Expected Discharge Plan: OP Rehab Barriers to Discharge: Continued Medical Work up  Expected Discharge Plan and Services Expected Discharge Plan: OP Rehab   Discharge Planning Services: CM Consult   Living arrangements for the past 2 months: Single Family Home                 DME Arranged: 3-N-1, Walker rolling, Hospital bed DME Agency: [employer] Date DME Agency Contacted: 07/17/18 Time DME Agency Contacted: 1304 Representative spoke with at DME Agency: Oletha Cruel                 Readmission Risk Interventions No flowsheet data found.   Quintella Baton, RN, BSN  Trauma/Neuro ICU Case Manager [phone]

## 2018-07-17 NOTE — Progress Notes (Signed)
[facility] Progress Note     Subjective: CC: pain in ribs Pain with coughing or movement. Patient requesting hospital bed. Patient reports some dizziness with getting up too quickly. Tolerating diet and passing flatus. A&Ox4.   Objective: Vital signs in last 24 hours: Temp:  [98 F (36.7 C)-98.6 F (37 C)] 98.1 F (36.7 C) (05/07 0756) Pulse Rate:  [69-93] 87 (05/07 0756) Resp:  [19-21] 19 (05/07 0756) BP: (115-146)/(58-73) 115/58 (05/07 0756) SpO2:  [93 %-96 %] 94 % (05/07 0756) Last BM Date: 07/14/18  Intake/Output from previous day: 05/06 0701 - 05/07 0700 In: -  Out: 1500 [Urine:1500] Intake/Output this shift: No intake/output data recorded.  PE: Gen:  Alert, NAD, pleasant Card:  Regular rate and rhythm Pulm:  Normal effort, clear to auscultation bilaterally Abd: Soft, non-tender, non-distended, +BS Skin: warm and dry, no rashes  Neuro: A&Ox4, speech clear   Lab Results:  Recent Labs    07/16/18 0816  WBC 7.2  HGB 12.4*  HCT 37.3*  PLT 159   BMET Recent Labs    07/16/18 0816  NA 137  K 4.4  CL 106  CO2 18*  GLUCOSE 124*  BUN 21  CREATININE 0.79  CALCIUM 8.2*   PT/INR No results for input(s): LABPROT, INR in the last 72 hours. CMP     Component Value Date/Time   NA 137 07/16/2018 0816   K 4.4 07/16/2018 0816   CL 106 07/16/2018 0816   CO2 18 (L) 07/16/2018 0816   GLUCOSE 124 (H) 07/16/2018 0816   BUN 21 07/16/2018 0816   CREATININE 0.79 07/16/2018 0816   CALCIUM 8.2 (L) 07/16/2018 0816   PROT 6.5 07/14/2018 0153   ALBUMIN 4.1 07/14/2018 0153   AST 36 07/14/2018 0153   ALT 41 07/14/2018 0153   ALKPHOS 48 07/14/2018 0153   BILITOT 0.8 07/14/2018 0153   GFRNONAA >60 07/16/2018 0816   GFRAA >60 07/16/2018 0816   Lipase  No results found for: LIPASE     Studies/Results: Ct Head Wo Contrast  Result Date: 07/17/2018 CLINICAL DATA:  Followup subdural hematoma EXAM: CT HEAD WITHOUT CONTRAST TECHNIQUE: Contiguous axial  images were obtained from the base of the skull through the vertex without intravenous contrast. COMPARISON:  07/15/2018 FINDINGS: Brain: Left convexity subdural hematoma is not significantly changed, 7-8 mm in thickness. Small amount of subarachnoid hemorrhage remains evident within the sulci. Small amount of layering intraventricular hemorrhage. Ventricular size remains stable. Chronic small-vessel changes of the white matter as seen previously. No new finding. Vascular: No abnormal vascular finding. Skull: No skull fracture. Sinuses/Orbits: Clear/normal Other: Right parietal scalp injury with staples. IMPRESSION: No additional bleeding since the study of 2 days ago. Stable left convexity subdural hematoma, 7-8 mm in thickness. No significant mass-effect. Small amount of subarachnoid hemorrhage in the sulci. Small amount of dependent intraventricular hemorrhage without change in ventricular size. Electronically Signed   By: Paulina FusiMark  Shogry M.D.   On: 07/17/2018 08:04    Anti-infectives: Anti-infectives (From admission, onward)   None       Assessment/Plan Fall SDH/SAH - f/u head CT 5/5 stable, f/u CT today stable, f/u NS in 2 weeks R 6-7 rib fractures - multimodal pain control, IS, pulm toilet On chronic anticoagulation - holding plavix/ASA for now HTN - home meds T2DM - SSI  FEN: CM diet VTE: SCDs ID: no abx  Dispo: continue therapies, pain control, possibly home later today   LOS: 3 days    Wells GuilesKelly Rayburn , PA-C  [facility] 07/17/2018, 8:53 AM Pager: [phone]

## 2018-07-18 ENCOUNTER — Inpatient Hospital Stay (HOSPITAL_COMMUNITY): Payer: Medicare HMO

## 2018-07-18 DIAGNOSIS — I480 Paroxysmal atrial fibrillation: Secondary | ICD-10-CM

## 2018-07-18 DIAGNOSIS — I4819 Other persistent atrial fibrillation: Secondary | ICD-10-CM

## 2018-07-18 DIAGNOSIS — I2510 Atherosclerotic heart disease of native coronary artery without angina pectoris: Secondary | ICD-10-CM

## 2018-07-18 LAB — CBC
HCT: 37.7 % — ABNORMAL LOW (ref 39.0–52.0)
Hemoglobin: 12.7 g/dL — ABNORMAL LOW (ref 13.0–17.0)
MCH: 29.5 pg (ref 26.0–34.0)
MCHC: 33.7 g/dL (ref 30.0–36.0)
MCV: 87.7 fL (ref 80.0–100.0)
Platelets: 194 10*3/uL (ref 150–400)
RBC: 4.3 MIL/uL (ref 4.22–5.81)
RDW: 13.4 % (ref 11.5–15.5)
WBC: 6.1 10*3/uL (ref 4.0–10.5)
nRBC: 0 % (ref 0.0–0.2)

## 2018-07-18 LAB — GLUCOSE, CAPILLARY
Glucose-Capillary: 119 mg/dL — ABNORMAL HIGH (ref 70–99)
Glucose-Capillary: 267 mg/dL — ABNORMAL HIGH (ref 70–99)
Glucose-Capillary: 216 mg/dL — ABNORMAL HIGH (ref 70–99)
Glucose-Capillary: 179 mg/dL — ABNORMAL HIGH (ref 70–99)

## 2018-07-18 LAB — BASIC METABOLIC PANEL
Anion gap: 17 — ABNORMAL HIGH (ref 5–15)
BUN: 31 mg/dL — ABNORMAL HIGH (ref 8–23)
CO2: 18 mmol/L — ABNORMAL LOW (ref 22–32)
Calcium: 8.7 mg/dL — ABNORMAL LOW (ref 8.9–10.3)
Chloride: 101 mmol/L (ref 98–111)
Creatinine, Ser: 1.09 mg/dL (ref 0.61–1.24)
GFR calc Af Amer: >60 mL/min (ref >60)
GFR calc non Af Amer: >60 mL/min (ref >60)
Glucose, Bld: 361 mg/dL — ABNORMAL HIGH (ref 70–99)
Potassium: 4.8 mmol/L (ref 3.5–5.1)
Sodium: 136 mmol/L (ref 135–145)

## 2018-07-18 LAB — ECHOCARDIOGRAM COMPLETE
Height: 69 in
Weight: 3791.91 oz

## 2018-07-18 LAB — TROPONIN I
Troponin I: <0.03 ng/mL (ref <0.03)
Troponin I: 0.16 ng/mL (ref <0.03)

## 2018-07-18 LAB — TSH: TSH: 5.115 u[IU]/mL — ABNORMAL HIGH (ref 0.350–4.500)

## 2018-07-18 IMAGING — DX PORTABLE CHEST - 1 VIEW
1 series · 1 of 1 positions shown · non-contrast
Comparison: [DATE]

CLINICAL DATA: Shortness of breath.  Rib fracture.

EXAM:
PORTABLE CHEST 1 VIEW

[chest]
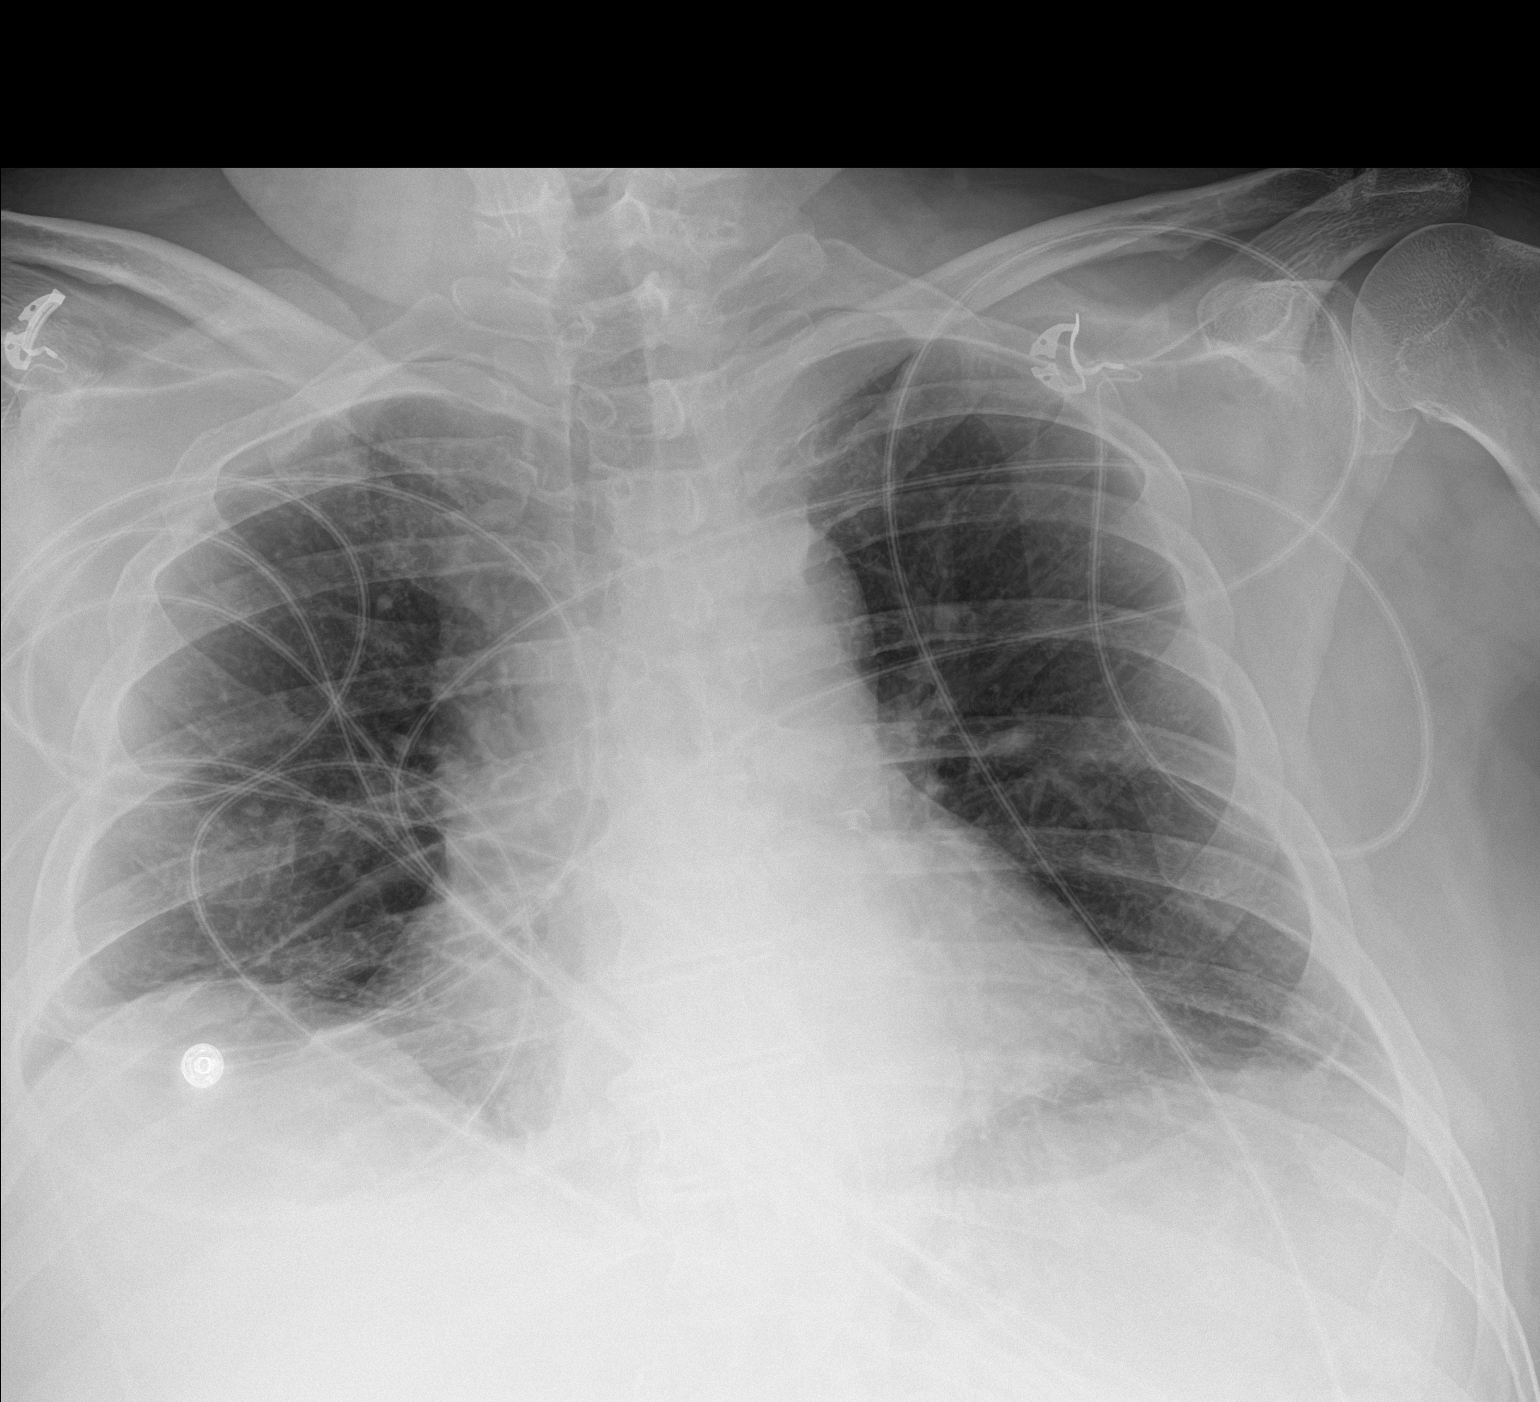

[1 of 1 positions shown; findings below may reference images not displayed]

FINDINGS: [OS] hours. Low volumes with asymmetric elevation right
hemidiaphragm. The cardio pericardial silhouette is enlarged. There
is pulmonary vascular congestion without overt pulmonary edema. No
evidence for pneumothorax or substantial pleural effusion. There is
some atelectasis at the bases. Posterior sixth and seventh rib
fractures seen on recent CT not readily evident on x-ray.
IMPRESSION: Low lung volumes with basilar atelectasis.

No evidence for pneumothorax.

## 2018-07-18 MED ORDER — METOPROLOL SUCCINATE ER 50 MG PO TB24
50.0000 mg | ORAL_TABLET | Freq: Every day | ORAL | Status: DC
  Administered 2018-07-19 – 2018-07-21 (×3): 50 mg via ORAL
  Filled 2018-07-18 (×3): qty 1

## 2018-07-18 MED ORDER — TRAMADOL HCL 50 MG PO TABS: 50.0000 mg | ORAL_TABLET | Freq: Four times a day (QID) | ORAL | 0 refills | Status: DC | PRN

## 2018-07-18 MED ORDER — GUAIFENESIN ER 600 MG PO TB12: 600.0000 mg | ORAL_TABLET | Freq: Two times a day (BID) | ORAL | Status: DC

## 2018-07-18 MED ORDER — METHOCARBAMOL 750 MG PO TABS: 750.0000 mg | ORAL_TABLET | Freq: Three times a day (TID) | ORAL | 0 refills | Status: DC

## 2018-07-18 MED ORDER — INSULIN ASPART 100 UNIT/ML ~~LOC~~ SOLN
0.0000 [IU] | Freq: Three times a day (TID) | SUBCUTANEOUS | Status: DC
  Administered 2018-07-18: 8 [IU] via SUBCUTANEOUS
  Administered 2018-07-18: 5 [IU] via SUBCUTANEOUS
  Administered 2018-07-19: 8 [IU] via SUBCUTANEOUS
  Administered 2018-07-19: 3 [IU] via SUBCUTANEOUS
  Administered 2018-07-19: 5 [IU] via SUBCUTANEOUS
  Administered 2018-07-20 (×2): 8 [IU] via SUBCUTANEOUS
  Administered 2018-07-20: 09:00:00 2 [IU] via SUBCUTANEOUS
  Administered 2018-07-21: 5 [IU] via SUBCUTANEOUS
  Administered 2018-07-21: 8 [IU] via SUBCUTANEOUS
  Administered 2018-07-21: 3 [IU] via SUBCUTANEOUS
  Administered 2018-07-22: 5 [IU] via SUBCUTANEOUS
  Administered 2018-07-22: 3 [IU] via SUBCUTANEOUS

## 2018-07-18 MED ORDER — GABAPENTIN 300 MG PO CAPS: 300.0000 mg | ORAL_CAPSULE | Freq: Three times a day (TID) | ORAL | 0 refills | Status: DC

## 2018-07-18 MED ORDER — ACETAMINOPHEN 325 MG PO TABS: 650.0000 mg | ORAL_TABLET | Freq: Four times a day (QID) | ORAL | Status: DC | PRN

## 2018-07-18 MED ORDER — DILTIAZEM LOAD VIA INFUSION
20.0000 mg | Freq: Once | INTRAVENOUS | Status: AC
  Administered 2018-07-18: 20 mg via INTRAVENOUS
  Filled 2018-07-18: qty 20

## 2018-07-18 MED ORDER — METOPROLOL TARTRATE 25 MG PO TABS
25.0000 mg | ORAL_TABLET | Freq: Once | ORAL | Status: AC
  Administered 2018-07-18: 25 mg via ORAL
  Filled 2018-07-18: qty 1

## 2018-07-18 MED ORDER — DILTIAZEM HCL-DEXTROSE 100-5 MG/100ML-% IV SOLN (PREMIX)
5.0000 mg/h | INTRAVENOUS | Status: DC
  Administered 2018-07-18: 10 mg/h via INTRAVENOUS
  Administered 2018-07-18: 5 mg/h via INTRAVENOUS
  Administered 2018-07-19: 10 mg/h via INTRAVENOUS
  Filled 2018-07-18 (×4): qty 100

## 2018-07-18 NOTE — Progress Notes (Signed)
RN found me on the floor and asked me to see patient as his HR was just noted on the monitor to be in the 150-160s.  I came to see the patient and he was standing beside the bed in NAD.  He complained of some slight dizziness but denies chest pain, shortness of breath, diaphoresis, nausea, etc.  He states he has had a stent placed about 2 years ago in [place].  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   PE: Gen: NAD Heart: irregularly irregular and very tachycardic Lungs: CTAB Skin: warm and dry  A/P: 1. New onset A fib with RVR -cards consult pending -started on cardizem gtt -stat troponins, CBC, BMET, and CXR -cancelled discharge -Called and spoke to his wife to update her on these new findings.  She understands and is appreciative of our help  Letha Cape 2:23 PM 07/18/2018

## 2018-07-18 NOTE — Progress Notes (Signed)
  Speech Language Pathology Treatment: Cognitive-Linquistic  Patient Details Name: [name] Frank MRN: [id number] DOB: 01/01/1951 Today's Date: 07/18/2018 Time: 3976-7341 SLP Time Calculation (min) (ACUTE ONLY): 46 min  Assessment / Plan / Recommendation Clinical Impression  Pt was seen for cognitive-linguistic treatment. He recalled the tasks which were completed earlier with occupational therapy but not any of the external memory aids which were reviewed with him or that they were reviewed.He was cooperative throughout the session. He demonstrated 40% accuracy with delayed recall of 4 items increasing to 100% acuracy with mod.-max. Cues for use of external memory aids. He achieved 100% accuracy with time management problems. He demonstrated 50% accuracy with drawing inferences from recorded voicemails increasing to 70% accuracy with repetition of recordings and moderate cues for reasoning. SLP will continue to follow pt.    HPI HPI: Pt is a 68 year-old male who presented after an unwitnessed fall at home, possibly down a few steps.  He was found by his wife and was brought in by EMS with a laceration on the back of his scalp and questionable LOC. CT of the head showed acute subdural hematoma along the left cerebral convexity measuring up to 11 mm in thickness. Mild cerebral mass effect. Patchy subarachnoid hemorrhage with a traumatic pattern.      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 Vear Clock, MS, CCC-SLP Acute Rehabilitation Services Office number [phone] Pager [phone]                 Scheryl Marten 07/18/2018, 3:44 PM

## 2018-07-18 NOTE — Progress Notes (Signed)
  Echocardiogram 2D Echocardiogram has been performed.  [name] Frank 07/18/2018, 5:21 PM

## 2018-07-18 NOTE — TOC Transition Note (Signed)
Transition of Care [facility]) - CM/SW Discharge Note   Patient Details  Name: [name] Frank MRN: [id number] Date of Birth: [birth year]  Transition [facility]) CM/SW Contact:  Glennon Mac, RN Phone Number: 07/18/2018, 1:59 PM   Clinical Narrative:   Patient is a 68 yo M s/p fall on anticoagulation with resultant SDH, SAH, R 6-7 ribs fx.  Pt medically stable for discharge home today with spouse, who can provide 24h care.  PT/OT now feel pt needs HH instead of OP therapies; orders changed for HHPT/OT and ST.  Referral to [facility],per wife's choice and insurance contract.  Hospital bed and 3 in1  will be delivered today between 3p and 7p.  Attempted to make PCP follow up appt for pt, but office prefers to call pt/wife to schedule.     Final next level of care: Home w Home Health Services Barriers to Discharge: No Barriers Identified   Patient Goals and CMS Choice Patient states their goals for this hospitalization and ongoing recovery are:: To get back home CMS Medicare.gov Compare Post Acute Care list provided to:: Patient Choice offered to / list presented to : Spouse                      Discharge Plan and Services   Discharge Planning Services: CM Consult Post Acute Care Choice: Home Health          DME Arranged: 3-N-1, Walker rolling, Hospital bed DME Agency: [employer] Date DME Agency Contacted: 07/17/18 Time DME Agency Contacted: 1304 Representative spoke with at DME Agency: Oletha Cruel HH Arranged: PT, OT, Speech Therapy HH Agency: [facility] Date [facility] Agency Contacted: 07/18/18 Time HH Agency Contacted: 1344 Representative spoke with at [facility] Agency: Eugenio Hoes  Social Determinants of Health (SDOH) Interventions     Readmission Risk Interventions Readmission Risk Prevention Plan 07/18/2018  Post Dischage Appt Not Complete  Appt Comments PCP refused to set up with RN Case manager; prefers to call pt  Medication Screening Complete   Transportation Screening Complete  Some recent data might be hidden   Quintella Baton, RN, BSN  Trauma/Neuro ICU Case Manager [phone]

## 2018-07-18 NOTE — Progress Notes (Signed)
[facility] Progress Note     Subjective: CC: rib pain Patient reports sharp pains with movement still. Pain control is however improved from yesterday and patient appears much more comfortable. Patient reports he was able to cough up some phlegm and has not been coughing much since this. Tolerating diet.  Spoke with patient's wife over the phone, she is not sure when hospital bed is being delivered.   Objective: Vital signs in last 24 hours: Temp:  [97.5 F (36.4 C)-98.8 F (37.1 C)] 98.1 F (36.7 C) (05/08 0724) Pulse Rate:  [56-60] 60 (05/08 0724) Resp:  [16-23] 21 (05/08 0724) BP: (126-134)/(60-74) 131/74 (05/08 0724) SpO2:  [98 %-100 %] 100 % (05/08 0724) Last BM Date: 07/14/18  Intake/Output from previous day: 05/07 0701 - 05/08 0700 In: 828 [P.O.:720; I.V.:108] Out: 2175 [Urine:2175] Intake/Output this shift: Total I/O In: -  Out: 750 [Urine:750]  PE: Gen: Alert, NAD, pleasant Card: Regular rate and rhythm Pulm: Normal effort, clear to auscultation bilaterally, pulled 750 on IS Abd: Soft, non-tender, non-distended,+BS Skin: warm and dry, no rashes Neuro: A&Ox4, speech clear  Lab Results:  Recent Labs    07/16/18 0816  WBC 7.2  HGB 12.4*  HCT 37.3*  PLT 159   BMET Recent Labs    07/16/18 0816  NA 137  K 4.4  CL 106  CO2 18*  GLUCOSE 124*  BUN 21  CREATININE 0.79  CALCIUM 8.2*   PT/INR No results for input(s): LABPROT, INR in the last 72 hours. CMP     Component Value Date/Time   NA 137 07/16/2018 0816   K 4.4 07/16/2018 0816   CL 106 07/16/2018 0816   CO2 18 (L) 07/16/2018 0816   GLUCOSE 124 (H) 07/16/2018 0816   BUN 21 07/16/2018 0816   CREATININE 0.79 07/16/2018 0816   CALCIUM 8.2 (L) 07/16/2018 0816   PROT 6.5 07/14/2018 0153   ALBUMIN 4.1 07/14/2018 0153   AST 36 07/14/2018 0153   ALT 41 07/14/2018 0153   ALKPHOS 48 07/14/2018 0153   BILITOT 0.8 07/14/2018 0153   GFRNONAA >60 07/16/2018 0816   GFRAA >60  07/16/2018 0816   Lipase  No results found for: LIPASE     Studies/Results: Ct Head Wo Contrast  Result Date: 07/17/2018 CLINICAL DATA:  Followup subdural hematoma EXAM: CT HEAD WITHOUT CONTRAST TECHNIQUE: Contiguous axial images were obtained from the base of the skull through the vertex without intravenous contrast. COMPARISON:  07/15/2018 FINDINGS: Brain: Left convexity subdural hematoma is not significantly changed, 7-8 mm in thickness. Small amount of subarachnoid hemorrhage remains evident within the sulci. Small amount of layering intraventricular hemorrhage. Ventricular size remains stable. Chronic small-vessel changes of the white matter as seen previously. No new finding. Vascular: No abnormal vascular finding. Skull: No skull fracture. Sinuses/Orbits: Clear/normal Other: Right parietal scalp injury with staples. IMPRESSION: No additional bleeding since the study of 2 days ago. Stable left convexity subdural hematoma, 7-8 mm in thickness. No significant mass-effect. Small amount of subarachnoid hemorrhage in the sulci. Small amount of dependent intraventricular hemorrhage without change in ventricular size. Electronically Signed   By: Paulina FusiMark  Shogry M.D.   On: 07/17/2018 08:04    Anti-infectives: Anti-infectives (From admission, onward)   None       Assessment/Plan Fall SDH/SAH- f/u head CT 5/5 stable, f/u CT today stable, f/u NS in 2 weeks R 6-7 rib fractures- multimodal pain control, IS, pulm toilet On chronic anticoagulation- holding plavix/ASA for now HTN- home meds T2DM- SSI  FEN: CM diet VTE: SCDs ID: no abx  Dispo: Awaiting hospital bed delivery, medically stable for discharge  LOS: 4 days    Wells Guiles , [facility] 07/18/2018, 8:46 AM Pager: [phone]

## 2018-07-18 NOTE — Progress Notes (Signed)
Physical Therapy Treatment Patient Details Name: [name] Frank MRN: [id number] DOB: [birth year] Today's Date: 07/18/2018    History of Present Illness Patient is a 68 yo M s/p fall on anticoagulation with resultant SDH, SAH, R 6-7 ribs fx    PT Comments    Session focused on dual tasking with mobility for cognitive remediation and challenging balance. Pt with noticeably decreased gait speed with physical and cognitive dual tasking I.e. looking in different directions, naming animals that start with letter "A." Ambulating x 200 feet with walker and min guard assistance. Still requiring increased assist for transfers to standing from lower surface. D/c plan remains appropriate.      Follow Up Recommendations  Outpatient PT;Supervision/Assistance - 24 hour     Equipment Recommendations  Rolling walker with 5" wheels;3in1 (PT)    Recommendations for Other Services       Precautions / Restrictions Precautions Precautions: Fall Restrictions Weight Bearing Restrictions: No    Mobility  Bed Mobility               General bed mobility comments: Received up in chair  Transfers Overall transfer level: Needs assistance Equipment used: Rolling walker (2 wheeled) Transfers: Sit to/from Stand Sit to Stand: Min assist         General transfer comment: Pt with difficulty achieving forward momentum to initiate standing. Requiring min assist for lift  Ambulation/Gait Ambulation/Gait assistance: Min guard Gait Distance (Feet): 200 Feet Assistive device: Rolling walker (2 wheeled) Gait Pattern/deviations: Step-through pattern;Decreased dorsiflexion - right;Decreased dorsiflexion - left;Trunk flexed Gait velocity: decreased   General Gait Details: cues for increased foot clearance, shuffling gait pattern worsens with fatigue. also provided cues for looking up. decreased gait speed with dual tasking   Stairs             Wheelchair Mobility    Modified Rankin (Stroke  Patients Only) Modified Rankin (Stroke Patients Only) Pre-Morbid Rankin Score: No symptoms Modified Rankin: Moderately severe disability     Balance Overall balance assessment: Needs assistance;History of Falls Sitting-balance support: Feet supported Sitting balance-Leahy Scale: Good     Standing balance support: No upper extremity supported Standing balance-Leahy Scale: Fair                              Cognition Arousal/Alertness: Awake/alert Behavior During Therapy: WFL for tasks assessed/performed Overall Cognitive Status: Impaired/Different from baseline Area of Impairment: Attention;Memory;Following commands;Awareness;Problem solving;Rancho level                   Current Attention Level: Selective Memory: Decreased short-term memory Following Commands: Follows one step commands consistently Safety/Judgement: Decreased awareness of safety;Decreased awareness of deficits Awareness: Emergent Problem Solving: Requires verbal cues;Requires tactile cues General Comments: Decreased ability to follow multi step commands      Exercises      General Comments        Pertinent Vitals/Pain Pain Assessment: Faces Faces Pain Scale: Hurts little more Pain Location: mid thoracic Pain Descriptors / Indicators: Aching;Grimacing;Guarding Pain Intervention(s): Monitored during session    Home Living                      Prior Function            PT Goals (current goals can now be found in the care plan section) Acute Rehab PT Goals Patient Stated Goal: to go back to work  PT Goal Formulation: With patient Time  For Goal Achievement: 07/29/18 Potential to Achieve Goals: Good Progress towards PT goals: Progressing toward goals    Frequency    Min 3X/week      PT Plan Current plan remains appropriate    Co-evaluation              AM-PAC PT "6 Clicks" Mobility   Outcome Measure  Help needed turning from your back to your side  while in a flat bed without using bedrails?: A Lot Help needed moving from lying on your back to sitting on the side of a flat bed without using bedrails?: A Little Help needed moving to and from a bed to a chair (including a wheelchair)?: A Little Help needed standing up from a chair using your arms (e.g., wheelchair or bedside chair)?: A Little Help needed to walk in hospital room?: A Little Help needed climbing 3-5 steps with a railing? : A Little 6 Click Score: 17    End of Session   Activity Tolerance: Patient tolerated treatment well Patient left: in chair;with call bell/phone within reach;with chair alarm set Nurse Communication: Mobility status PT Visit Diagnosis: Unsteadiness on feet (R26.81);Difficulty in walking, not elsewhere classified (R26.2);Other symptoms and signs involving the nervous system (M19.622)     Time: 2979-8921 PT Time Calculation (min) (ACUTE ONLY): 22 min  Charges:  $Gait Training: 8-22 mins                     Laurina Bustle, [place], DPT Acute Rehabilitation Services Pager [phone] Office [phone]   [name] Frank 07/18/2018, 10:10 AM

## 2018-07-18 NOTE — Progress Notes (Signed)
Elevated troponin, md notified and call returned.

## 2018-07-18 NOTE — Progress Notes (Signed)
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  He requires mod cues for sequencing, organization and thoroughness when performing bathing.  Written info was reviewed with him re: memory strategies, cognitive and vision activities, TBI booklet, and handout.  Also phone wife and spoke with him re: deficits and recommendations for 24 hour supervision, no driving, no working, direct supervision/assist with medications and finances.  She verbalized understanding, but did not seem to fully grasp all info and states she has memory deficits.  Recommendations changed from OP therapies to HHOT/PT/SLP to   Follow Up Recommendations  Home health OT;Supervision/Assistance - 24 hour    Equipment Recommendations  None recommended by OT    Recommendations for Other Services      Precautions / Restrictions Precautions Precautions: Fall Restrictions Weight Bearing Restrictions: No       Mobility Bed Mobility               General bed mobility comments: Received up in chair  Transfers Overall transfer level: Needs assistance Equipment used: Rolling walker (2 wheeled) Transfers: Sit to/from Stand Sit to Stand: Min guard Stand pivot transfers: Min guard       General transfer comment: required increased time and effort     Balance Overall balance assessment: Needs assistance;History of Falls Sitting-balance support: Feet supported Sitting balance-Leahy Scale: Good     Standing balance support: No upper extremity supported Standing balance-Leahy Scale: Fair                             ADL either performed or assessed with clinical judgement   ADL Overall ADL's : Needs  assistance/impaired Eating/Feeding: Independent;Sitting   Grooming: Wash/dry hands;Wash/dry face;Brushing hair;Min guard;Standing   Upper Body Bathing: Supervision/ safety;Sitting Upper Body Bathing Details (indicate cue type and reason): mod verbal cues for sequencing and organization  Lower Body Bathing: Moderate assistance;Sit to/from stand Lower Body Bathing Details (indicate cue type and reason): mod cues for thoroughness.  difficulty accessing LEs below knees, and feet      Lower Body Dressing: Maximal assistance   Toilet Transfer: Min guard;Ambulation;BSC;Comfort height toilet;Grab bars;RW   Toileting- Clothing Manipulation and Hygiene: Moderate assistance;Sit to/from stand       Functional mobility during ADLs: Rolling walker;Min guard       Vision   Additional Comments: Pt with mild horizontal nystagmus noted today.  He demonstrates impaired gaze stabilization, and has the sensation of the object jumping    Perception     Praxis      Cognition Arousal/Alertness: Awake/alert Behavior During Therapy: WFL for tasks assessed/performed Overall Cognitive Status: Impaired/Different from baseline Area of Impairment: Attention;Memory;Following commands;Awareness;Problem solving;Rancho level                   Current Attention Level: Selective Memory: Decreased short-term memory Following Commands: Follows one step commands consistently Safety/Judgement: Decreased awareness of safety;Decreased awareness of deficits Awareness: Emergent;Intellectual Problem Solving: Requires verbal cues;Requires tactile cues;Slow processing;Difficulty sequencing General Comments: Pt's condom catheter came off, and he was saturated in urine with no awareness.   He performed bathing at sink and required mod cues for sequencing  and organizing task as well as  for throughness.  He is internally distracted by pain, as well as distracted by lines, IV's etc         Exercises      Shoulder Instructions       General Comments Reviewed deficits with his.  He was provided with writtent information re: strategies for memory, signs/symptoms of TBI and progression of return to activity; TBI booklet left for wife with infomation re: Ranchos level VII highlighted.  Also provided handouts re: activities to improve visual scanning and cognition.  Reviewed need for structured home routine, no driving, no working.   Phone wife and discussed his deficits with her as well as need for 24 hour direct supervision/assist.  reviewed handouts with her, and let her know the information would be in his bags.  Also reviewed need to create a structured daily routine, and that pt should not attempt to drive or work.  She seemed  somewhat suprised by much of the info provided, then later stated "is this all written down?  I have memory problems of my own"  Discussed phone call with PT and with CM.   At this time feel HH therapies may be most appropriate to ensure wife's carry over of info in their home environment due to her stated memory deficits and she didn't  seem to fully grasp the extent of his deficits     Pertinent Vitals/ Pain       Pain Assessment: 0-10 Pain Score: 3  Faces Pain Scale: Hurts little more Pain Location: mid thoracic back and ribs  Pain Descriptors / Indicators: Aching;Grimacing;Guarding Pain Intervention(s): Monitored during session  Home Living                                          Prior Functioning/Environment              Frequency  Min 2X/week        Progress Toward Goals  OT Goals(current goals can now be found in the care plan section)  Progress towards OT goals: Progressing toward goals  Acute Rehab OT Goals Patient Stated Goal: to go back to work   Plan Discharge plan needs to be updated    Co-evaluation                 AM-PAC OT "6 Clicks" Daily Activity     Outcome Measure   Help from another person eating  meals?: None Help from another person taking care of personal grooming?: A Little Help from another person toileting, which includes using toliet, bedpan, or urinal?: A Little Help from another person bathing (including washing, rinsing, drying)?: A Little Help from another person to put on and taking off regular upper body clothing?: A Little Help from another person to put on and taking off regular lower body clothing?: A Lot 6 Click Score: 18    End of Session Equipment Utilized During Treatment: Rolling walker  OT Visit Diagnosis: Unsteadiness on feet (R26.81);Cognitive communication deficit (R41.841)   Activity Tolerance Patient tolerated treatment well   Patient Left in chair;with call bell/phone within reach;with chair alarm set   Nurse Communication Mobility status        Time: 1610-9604 OT Time Calculation (min): 79 min  Charges: OT General Charges $OT Visit: 1 Visit OT Treatments $Self Care/Home Management : 68-82 mins  Jeani Hawking, OTR/L Acute Rehabilitation Services Pager [phone] Office  [phone]    Jeani HawkingConarpe, Russel Morain M 07/18/2018, 1:59 PM

## 2018-07-18 NOTE — Discharge Summary (Signed)
Physician Discharge Summary  Patient ID: Shari HeritageDonald G Rajkumar MRN: [id number] DOB/AGE: [birth year] 68 y.o.  Admit date: 07/14/2018 Discharge date: 07/22/2018  Discharge Diagnoses Fall Right rib fractures SAH New onset Atrial Fibrillation   Consultants Neurosurgery Cardiology  Procedures None  HPI: 68 year old male on Plavix presents after an unwitnessed fall at home, possibly down a few steps. He was found by his wife. He was brought in by EMS with a laceration on the back of his scalp. Hemodynamically stable throughout. Questionable LOC. He complains of some back pain.  He was found to have a scalp laceration, subdural hematoma with scattered subarachnoid hemorrhage, and two rib fractures.  Hospital Course: Patient was admitted to the trauma service and NS was consulted for his SAH/SDH.  His plavix was indefinitely held due to the size of the bleed.  He had 2 follow up CTs of his head on HD 1 and HD 3 of which both were stable with no new additional bleeding.  He was mobilized with therapies and found to need PT/OT/SLP therapies.  These were arranged.  Just prior to discharge the patient was noted to be in rapid a fib with rvr.  He was placed on a cardizem gtt and cardiology was consulted.  He was ultimately controlled on diltiazem and metoprolol.  He was to resume his other normal cardiac meds.  Anticoagulation was not started as he will not be cleared for this for like 4 weeks secondary to his SAH/SDH.  He was restarted on his ASA.  Finally his rate was controlled and the patient was felt stable for DC home on HD 8.  Appropriate follows were provided.    Allergies as of 07/22/2018   No Known Allergies     Medication List    STOP taking these medications   clopidogrel 75 MG tablet Commonly known as:  PLAVIX     TAKE these medications   acetaminophen 325 MG tablet Commonly known as:  TYLENOL Take 2 tablets (650 mg total) by mouth every 6 (six) hours as needed for mild pain.   aspirin  81 MG chewable tablet Chew 81 mg by mouth daily.   atorvastatin 80 MG tablet Commonly known as:  LIPITOR Take 1 tablet (80 mg total) by mouth daily. What changed:  how much to take   diltiazem 360 MG 24 hr capsule Commonly known as:  CARDIZEM CD Take 1 capsule (360 mg total) by mouth daily.   gabapentin 300 MG capsule Commonly known as:  NEURONTIN Take 1 capsule (300 mg total) by mouth 3 (three) times daily.   glipiZIDE 10 MG 24 hr tablet Commonly known as:  GLUCOTROL XL Take 1 tablet (10 mg total) by mouth 2 (two) times daily.   guaiFENesin 600 MG 12 hr tablet Commonly known as:  MUCINEX Take 1 tablet (600 mg total) by mouth 2 (two) times daily.   Invokamet [phone] MG Tabs Generic drug:  Canagliflozin-metFORMIN HCl Take 1 tablet by mouth 2 (two) times daily.   losartan 50 MG tablet Commonly known as:  COZAAR Take 1 tablet (50 mg total) by mouth daily.   methocarbamol 750 MG tablet Commonly known as:  ROBAXIN Take 1 tablet (750 mg total) by mouth 3 (three) times daily.   metoprolol succinate 100 MG 24 hr tablet Commonly known as:  TOPROL-XL Take 1 tablet (100 mg total) by mouth daily. Take with or immediately following a meal. Start taking on:  Jul 23, 2018 What changed:    medication strength  how much to take  additional instructions   traMADol 50 MG tablet Commonly known as:  ULTRAM Take 1-2 tablets (50-100 mg total) by mouth every 6 (six) hours as needed for moderate pain or severe pain.            Durable Medical Equipment  (From admission, onward)         Start     Ordered   07/17/18 1338  For home use only DME Hospital bed  Once    Comments:  For est. 3 months  Question Answer Comment  Patient has (list medical condition): TBI, Right rib fractures   The above medical condition requires: Patient requires the ability to reposition frequently   Bed type Semi-electric      07/17/18 1338   07/17/18 0822  For home use only DME 3 n 1  Once      07/17/18 0821   07/17/18 0821  For home use only DME Walker rolling  Once    Question:  Patient needs a walker to treat with the following condition  Answer:  Multiple fractures of ribs, right side, initial encounter for closed fracture   07/17/18 3419           Follow-up Information    Copland, Gwenlyn Found, MD Follow up.   Specialty:  Family Medicine Why:  Call and arrange a follow up appointment in 1-2 weeks for syncopal workup and management of pain from rib fractures.  Contact information: [address] [place] [address] [phone]        Julio Sicks, MD Follow up.   Specialty:  Neurosurgery Why:  Call for follow up in 2 weeks Contact information: [address] [place] [address] [phone]        [facility] Follow up.   Why:  No follow up scheduled, call as needed with questions.  Contact information: [address] [place] [address] [phone]       [facility] Follow up.   Why:  Home physical, occupational and speech therapists to follow up with you at home.  They will call you to schedule appointments. Contact information: Phone:  [phone]       Hedwig Morton., MD. Schedule an appointment as soon as possible for a visit in 1 week(s).   Specialty:  Cardiology Why:  New onset A fib Contact information: [address] [place] [address] [phone]           Signed: Letha Cape , [facility] Surgery 07/22/2018, 1:22 PM Pager: [phone]

## 2018-07-18 NOTE — Consult Note (Signed)
Cardiology Consultation:   Patient ID: [name] Frank MRN: [id number]; DOB: 07-24-1950  Admit date: 07/14/2018 Date of Consult: 07/18/2018  Primary Care Provider: Pearline Cables, MD Primary Cardiologist: Hedwig Morton, MD [place] Primary Electrophysiologist:  None    Patient Profile:   [name] Frank is a 68 y.o. male with a hx of CAD with stent to LAD 2016 and POBA t 1st diag, HTN, HLD & DM-2. who is being seen today for the evaluation of a fib with RVR at the request of Dr. Fredricka Bonine.  History of Present Illness:   [name] with above hx of CAD with CAD and stent to LAD 2016, and POBA to 1st diag at same time initially on Brilinta but has been on Plavix and ASA for some time.  Last seen in 01/2018 by cards.  Pt was admitted 07/14/18 after an unwitnessed fall, he had a laceration on post head. Questionable LOC.  In Er was found to have subdural hematoma with scattered subarachnoid hemorrhage and 2 rib fractures.  ASA and plavix were stopped.    After several CT scans of head Neuro surgery notes to be off plavix for at least 4 weeks.  To see Neuro back in 2 weeks.     Today at 1338 pt developed a fib with RVR.  No chest pain, mild dizziness.  No SOB.  There is order for IV dilt and drip.  No heparin with subarachnoid hemorrhage.  CHA2DS2VASc of 4.    Recent labs Hgb 12.7 WBC 6.1 plts 194  Na 137, K+ 4.4, BUN 21 Cr 0.79 COVID neg.  troponins are pending.   CXR 1 V today with no PTX.    EKG:  The EKG was personally reviewed and demonstrates:  A fib with RVR at 149, mild ST depression inf leads but compared to admit EKG the ST depression was present then as well. Telemetry:  Telemetry was personally reviewed and demonstrates:  A fib with RVR  Currently BP 119/82  HR appears elevated Dr. Elease Hashimoto to review tele.  Pt with no chest pain, except for ib pain, not sure if he feels his heart racing.     . Past Medical History:  Diagnosis Date   Diabetes mellitus    Heart disease     Hyperlipemia    Hypertension     Past Surgical History:  Procedure Laterality Date   catarracts     OTHER SURGICAL HISTORY  2016   HEART STENT   TONSILLECTOMY       Home Medications:  Prior to Admission medications   Medication Sig Start Date End Date Taking? Authorizing Provider  aspirin 81 MG chewable tablet Chew 81 mg by mouth daily.     Yes [provider]  atorvastatin (LIPITOR) 80 MG tablet Take 1 tablet (80 mg total) by mouth daily. Patient taking differently: Take 40 mg by mouth daily.  01/16/18  Yes Copland, Gwenlyn Found, MD  clopidogrel (PLAVIX) 75 MG tablet Take 1 tablet (75 mg total) by mouth daily. 01/16/18  Yes Copland, Gwenlyn Found, MD  glipiZIDE (GLUCOTROL XL) 10 MG 24 hr tablet Take 1 tablet (10 mg total) by mouth 2 (two) times daily. 01/16/18  Yes Copland, Gwenlyn Found, MD  INVOKAMET [phone] MG TABS Take 1 tablet by mouth 2 (two) times daily. 01/16/18  Yes Copland, Gwenlyn Found, MD  losartan (COZAAR) 50 MG tablet Take 1 tablet (50 mg total) by mouth daily. 01/16/18  Yes Copland, Gwenlyn Found, MD  metoprolol succinate (  TOPROL-XL) 25 MG 24 hr tablet Take 1 tablet (25 mg total) by mouth daily. 01/16/18  Yes Copland, Gwenlyn Found, MD  acetaminophen (TYLENOL) 325 MG tablet Take 2 tablets (650 mg total) by mouth every 6 (six) hours as needed for mild pain. 07/18/18   Rayburn, Alphonsus Sias, PA-C  gabapentin (NEURONTIN) 300 MG capsule Take 1 capsule (300 mg total) by mouth 3 (three) times daily. 07/18/18   Rayburn, Alphonsus Sias, PA-C  guaiFENesin (MUCINEX) 600 MG 12 hr tablet Take 1 tablet (600 mg total) by mouth 2 (two) times daily. 07/18/18   Rayburn, Alphonsus Sias, PA-C  methocarbamol (ROBAXIN) 750 MG tablet Take 1 tablet (750 mg total) by mouth 3 (three) times daily. 07/18/18   Rayburn, Alphonsus Sias, PA-C  traMADol (ULTRAM) 50 MG tablet Take 1-2 tablets (50-100 mg total) by mouth every 6 (six) hours as needed for moderate pain or severe pain. 07/18/18   Rayburn, Alphonsus Sias, PA-C    Inpatient Medications: Scheduled  Meds:  acetaminophen  650 mg Oral Q6H   atorvastatin  80 mg Oral Daily   docusate sodium  100 mg Oral BID   gabapentin  300 mg Oral TID   guaiFENesin  600 mg Oral BID   insulin aspart  0-15 Units Subcutaneous TID WC   insulin aspart  0-5 Units Subcutaneous QHS   losartan  50 mg Oral Daily   methocarbamol  750 mg Oral TID   metoprolol succinate  25 mg Oral Daily   polyethylene glycol  17 g Oral Daily   Continuous Infusions:  0.9 % NaCl with KCl 20 mEq / L 50 mL/hr at 07/18/18 1406   PRN Meds: metoprolol tartrate, morphine injection, morphine injection, ondansetron **OR** ondansetron (ZOFRAN) IV, traMADol  Allergies:   No Known Allergies  Social History:   Social History   Socioeconomic History   Marital status: Married    Spouse name: Not on file   Number of children: Not on file   Years of education: Not on file   Highest education level: Not on file  Occupational History   Not on file  Social Needs   Financial resource strain: Not on file   Food insecurity:    Worry: Not on file    Inability: Not on file   Transportation needs:    Medical: Not on file    Non-medical: Not on file  Tobacco Use   Smoking status: Never Smoker   Smokeless tobacco: Never Used  Substance and Sexual Activity   Alcohol use: Yes    Comment: VERY RARELY   Drug use: No   Sexual activity: Not on file  Lifestyle   Physical activity:    Days per week: Not on file    Minutes per session: Not on file   Stress: Not on file  Relationships   Social connections:    Talks on phone: Not on file    Gets together: Not on file    Attends religious service: Not on file    Active member of club or organization: Not on file    Attends meetings of clubs or organizations: Not on file    Relationship status: Not on file   Intimate partner violence:    Fear of current or ex partner: Not on file    Emotionally abused: Not on file    Physically abused: Not on file    Forced  sexual activity: Not on file  Other Topics Concern   Not on file  Social History  Narrative   Not on file    Family History:    Family History  Problem Relation Age of Onset   Stroke Father    Hypertension Father    Hyperlipidemia Father    Early death Mother    Colitis Sister    Appendicitis Brother      ROS:  Please see the history of present illness.  General:no colds or fevers, no weight changes Skin:no rashes or ulcers HEENT:no blurred vision, no congestion CV:see HPI PUL:see HPI GI:no diarrhea constipation or melena, no indigestion GU:no hematuria, no dysuria MS:no joint pain, no claudication. + fx ribs Neuro:no syncope, no lightheadedness, + subarachnoid hemorrhage  Endo:+ diabetes glucose elevated here, no thyroid disease  All other ROS reviewed and negative.     Physical Exam/Data:   Vitals:   07/17/18 2146 07/18/18 0043 07/18/18 0407 07/18/18 0724  BP: 129/67 134/62 132/60 131/74  Pulse:   (!) 56 60  Resp: 19 16 16  (!) 21  Temp: 97.8 F (36.6 C) 98.1 F (36.7 C) (!) 97.5 F (36.4 C) 98.1 F (36.7 C)  TempSrc: Oral Oral Oral Oral  SpO2:   98% 100%  Weight:      Height:        Intake/Output Summary (Last 24 hours) at 07/18/2018 1412 Last data filed at 07/18/2018 0800 Gross per 24 hour  Intake 587.98 ml  Output 2275 ml  Net -1687.02 ml   Last 3 Weights 07/14/2018 07/14/2018 01/16/2018  Weight (lbs) 236 lb 15.9 oz 250 lb 231 lb  Weight (kg) 107.5 kg 113.399 kg 104.781 kg     Body mass index is 35 kg/m.   Physical Exam: Blood pressure 117/60, pulse 100, temperature 98.6 F (37 C), temperature source Oral, resp. rate (!) 25, height 5\' 9"  (1.753 m), weight 107.5 kg, SpO2 92 %.  GEN:  Well nourished, well developed in no acute distress HEENT: Normal NECK: No JVD; No carotid bruits LYMPHATICS: No lymphadenopathy CARDIAC: Irreg. Irreg. ,   Mildly tachycardic  RESPIRATORY:  Clear to auscultation without rales, wheezing or rhonchi  ABDOMEN: Soft,  non-tender, non-distended MUSCULOSKELETAL:  No edema; No deformity  SKIN: Warm and dry NEUROLOGIC:  Alert and oriented x 3  Relevant CV Studies:   Laboratory Data:  Chemistry Recent Labs  Lab 07/14/18 0153 07/16/18 0816  NA 137 137  K 4.0 4.4  CL 104 106  CO2 22 18*  GLUCOSE 283* 124*  BUN 26* 21  CREATININE 1.13 0.79  CALCIUM 9.0 8.2*  GFRNONAA >60 >60  GFRAA >60 >60  ANIONGAP 11 13    Recent Labs  Lab 07/14/18 0153  PROT 6.5  ALBUMIN 4.1  AST 36  ALT 41  ALKPHOS 48  BILITOT 0.8   Hematology Recent Labs  Lab 07/14/18 0153 07/16/18 0816  WBC 9.4 7.2  RBC 5.37 4.16*  HGB 15.7 12.4*  HCT 47.8 37.3*  MCV 89.0 89.7  MCH 29.2 29.8  MCHC 32.8 33.2  RDW 13.2 13.5  PLT 189 159   Cardiac EnzymesNo results for input(s): TROPONINI in the last 168 hours. No results for input(s): TROPIPOC in the last 168 hours.  BNPNo results for input(s): BNP, PROBNP in the last 168 hours.  DDimer No results for input(s): DDIMER in the last 168 hours.  Radiology/Studies:  Ct Head Wo Contrast  Result Date: 07/17/2018 CLINICAL DATA:  Followup subdural hematoma EXAM: CT HEAD WITHOUT CONTRAST TECHNIQUE: Contiguous axial images were obtained from the base of the skull through the vertex without  intravenous contrast. COMPARISON:  07/15/2018 FINDINGS: Brain: Left convexity subdural hematoma is not significantly changed, 7-8 mm in thickness. Small amount of subarachnoid hemorrhage remains evident within the sulci. Small amount of layering intraventricular hemorrhage. Ventricular size remains stable. Chronic small-vessel changes of the white matter as seen previously. No new finding. Vascular: No abnormal vascular finding. Skull: No skull fracture. Sinuses/Orbits: Clear/normal Other: Right parietal scalp injury with staples. IMPRESSION: No additional bleeding since the study of 2 days ago. Stable left convexity subdural hematoma, 7-8 mm in thickness. No significant mass-effect. Small amount of  subarachnoid hemorrhage in the sulci. Small amount of dependent intraventricular hemorrhage without change in ventricular size. Electronically Signed   By: Paulina Fusi M.D.   On: 07/17/2018 08:04   Ct Head Wo Contrast  Result Date: 07/15/2018 CLINICAL DATA:  68 y/o  M; subdural hematoma. EXAM: CT HEAD WITHOUT CONTRAST TECHNIQUE: Contiguous axial images were obtained from the base of the skull through the vertex without intravenous contrast. COMPARISON:  07/14/2018 CT head FINDINGS: Brain: Left-sided subdural hematoma is overall stable in volume, with decreased greatest thickness to 8 mm with and increased settling of blood products over the posterior convexity. Stable small volume of subarachnoid and intraventricular hemorrhage with redistribution to the occipital horns of lateral ventricles. No new intracranial hemorrhage, mass effect, stroke, or herniation. Stable ventricle size. Vascular: No hyperdense vessel or unexpected calcification. Skull: Right posterior scalp contusion and laceration with skin staples. No calvarial fracture Sinuses/Orbits: No acute finding. Other: Bilateral intra-ocular lens replacement IMPRESSION: 1. Stable volume left-sided subdural hematoma with decreased greatest thickness to 8 mm and increased settling of blood products over the posterior convexity. No midline shift. 2. Stable small volume of subarachnoid and intraventricular hemorrhage with mild redistribution to occipital horns of lateral ventricles. Stable ventricle size. 3. No new acute intracranial abnormality. Electronically Signed   By: Mitzi Hansen M.D.   On: 07/15/2018 00:48    Assessment and Plan:   1. A fib with RVR now on dilt drip.  Unable to add anticoagulation due to subarachnoid bleed that was fresh.  Has been on ASA and plavix as outpt for stent placed in 2016 to LAD.  Neuro had recommended to hold plavix for 4 weeks.  So no anticoagulation.   Will order echo and TSH.  Dr. Elease Hashimoto to see. 2. CAD  with prior stent to LAD and POBA to 1st diag in 2016.  Do not have cath report.  3. HTN stable on cozaar 50 and torpol XL 25 mg.  4. HLD on statin lipitor 80 5. DM-2 per primary team. 6. fx ribs and subarachnoid bleed after fall.       For questions or updates, please contact [facility] Please consult [email or web address] for contact info under     Signed, Nada Boozer, NP  07/18/2018 2:12 PM   Attending Note:   The patient was seen and examined.  Agree with assessment and plan as noted above.  Changes made to the above note as needed.  Patient seen and independently examined with Nada Boozer, NP .   We discussed all aspects of the encounter. I agree with the assessment and plan as stated above.  1.   CAD- no angina.  S/p PCI, his PCI was in 2016.  He has been maintained on Plavix.  At this point I do not think that he needs to have the Plavix-especially in light of the fact that he will need to be started on Eliquis.  He will follow-up  with Dr. Dot Been in [facility]   2. Atrial fib with RVR: He has persistent atrial fibrillation.  This was probably caused by the trauma of his fall and the subdural hematoma.  He is on a very low-dose Cardizem drip and his heart rate is only minimally elevated.  I think he will do well with the addition of more metoprolol and possibly the addition of low-dose diltiazem.  I will increase his metoprolol to 50 mg a day starting today.  We can titrated up further if needed tomorrow.  My hope is that we can discontinue the Cardizem drip later today or early tomorrow.  Eventually, he will need to be started on Eliquis 5 mg twice a day.  We will need clearance from Dr. Dutch Quint before we start the Eliquis.    Will follow up with Dr. Dot Been.   3. Dubdural hematoma:  Neurologically he appears to be fairly stable.  Further plans and recommendations per Dr. Dutch Quint.    I have spent a total of 40 minutes with patient reviewing hospital  notes , telemetry, EKGs, labs  and examining patient as well as establishing an assessment and plan that was discussed with the patient. > 50% of time was spent in direct patient care.    Vesta Mixer, Montez Hageman., MD, [facility] 07/18/2018, 3:12 PM [address],  [address] Office [phone] Pager [phone]

## 2018-07-19 DIAGNOSIS — R7989 Other specified abnormal findings of blood chemistry: Secondary | ICD-10-CM

## 2018-07-19 DIAGNOSIS — I480 Paroxysmal atrial fibrillation: Secondary | ICD-10-CM

## 2018-07-19 LAB — GLUCOSE, CAPILLARY
Glucose-Capillary: 166 mg/dL — ABNORMAL HIGH (ref 70–99)
Glucose-Capillary: 269 mg/dL — ABNORMAL HIGH (ref 70–99)
Glucose-Capillary: 224 mg/dL — ABNORMAL HIGH (ref 70–99)
Glucose-Capillary: 224 mg/dL — ABNORMAL HIGH (ref 70–99)

## 2018-07-19 LAB — TROPONIN I: Troponin I: 0.11 ng/mL (ref <0.03)

## 2018-07-19 MED ORDER — DILTIAZEM HCL 60 MG PO TABS
30.0000 mg | ORAL_TABLET | Freq: Four times a day (QID) | ORAL | Status: DC
  Administered 2018-07-19 – 2018-07-20 (×5): 30 mg via ORAL
  Filled 2018-07-19 (×6): qty 1

## 2018-07-19 NOTE — Progress Notes (Signed)
Progress Note  Patient Name: [name] Frank Date of Encounter: 07/19/2018  Primary Cardiologist:   Hedwig MortonOHRBECK,STEVEN C, MD   Subjective   Noted last night that the Trop was up slightly.  He has rib pain but no chest pain  Inpatient Medications    Scheduled Meds: . acetaminophen  650 mg Oral Q6H  . atorvastatin  80 mg Oral Daily  . docusate sodium  100 mg Oral BID  . gabapentin  300 mg Oral TID  . guaiFENesin  600 mg Oral BID  . insulin aspart  0-15 Units Subcutaneous TID WC  . insulin aspart  0-5 Units Subcutaneous QHS  . losartan  50 mg Oral Daily  . methocarbamol  750 mg Oral TID  . metoprolol succinate  50 mg Oral Daily  . polyethylene glycol  17 g Oral Daily   Continuous Infusions: . 0.9 % NaCl with KCl 20 mEq / L 50 mL/hr at 07/18/18 1406  . diltiazem (CARDIZEM) infusion 10 mg/hr (07/19/18 0717)   PRN Meds: metoprolol tartrate, morphine injection, morphine injection, ondansetron **OR** ondansetron (ZOFRAN) IV, traMADol   Vital Signs    Vitals:   07/18/18 1930 07/18/18 2309 07/19/18 0453 07/19/18 0811  BP: 112/82 121/75 136/76 122/65  Pulse: 85 71 84   Resp: (!) 22 19 (!) 28   Temp: 97.9 F (36.6 C) 98.1 F (36.7 C) 97.8 F (36.6 C) 98 F (36.7 C)  TempSrc: Oral Oral Oral Oral  SpO2: 98% 95% 96%   Weight:      Height:        Intake/Output Summary (Last 24 hours) at 07/19/2018 1207 Last data filed at 07/19/2018 0700 Gross per 24 hour  Intake 600.02 ml  Output 3050 ml  Net -2449.98 ml   Filed Weights   07/14/18 0153 07/14/18 0658  Weight: 113.4 kg 107.5 kg    Telemetry    Atrial fib with controlled rate - Personally Reviewed  ECG    Atrial fib, rate 95, axis WNL, no acute ST T wave changes.   - Personally Reviewed  Physical Exam   GEN: No acute distress.   Neck: No  JVD Cardiac: IrregularRR, no murmurs, rubs, or gallops, irregular  Respiratory: Clear  to auscultation bilaterally. GI: Soft, nontender, non-distended  MS: No  edema; No  deformity. Neuro:  Nonfocal  Psych: Normal affect   Labs    Chemistry Recent Labs  Lab 07/14/18 0153 07/16/18 0816 07/18/18 1401  NA 137 137 136  K 4.0 4.4 4.8  CL 104 106 101  CO2 22 18* 18*  GLUCOSE 283* 124* 361*  BUN 26* 21 31*  CREATININE 1.13 0.79 1.09  CALCIUM 9.0 8.2* 8.7*  PROT 6.5  --   --   ALBUMIN 4.1  --   --   AST 36  --   --   ALT 41  --   --   ALKPHOS 48  --   --   BILITOT 0.8  --   --   GFRNONAA >60 >60 >60  GFRAA >60 >60 >60  ANIONGAP 11 13 17*     Hematology Recent Labs  Lab 07/14/18 0153 07/16/18 0816 07/18/18 1401  WBC 9.4 7.2 6.1  RBC 5.37 4.16* 4.30  HGB 15.7 12.4* 12.7*  HCT 47.8 37.3* 37.7*  MCV 89.0 89.7 87.7  MCH 29.2 29.8 29.5  MCHC 32.8 33.2 33.7  RDW 13.2 13.5 13.4  PLT 189 159 194    Cardiac Enzymes Recent Labs  Lab 07/18/18  1401 07/18/18 2034 07/19/18 0120  TROPONINI <0.03 0.16* 0.11*   No results for input(s): TROPIPOC in the last 168 hours.   BNPNo results for input(s): BNP, PROBNP in the last 168 hours.   DDimer No results for input(s): DDIMER in the last 168 hours.   Radiology    Dg Chest Port 1 View  Result Date: 07/18/2018 CLINICAL DATA:  Shortness of breath.  Rib fracture. EXAM: PORTABLE CHEST 1 VIEW COMPARISON:  07/14/2018 FINDINGS: 1359 hours. Low volumes with asymmetric elevation right hemidiaphragm. The cardio pericardial silhouette is enlarged. There is pulmonary vascular congestion without overt pulmonary edema. No evidence for pneumothorax or substantial pleural effusion. There is some atelectasis at the bases. Posterior sixth and seventh rib fractures seen on recent CT not readily evident on x-ray. IMPRESSION: Low lung volumes with basilar atelectasis. No evidence for pneumothorax. Electronically Signed   By: Kennith Center M.D.   On: 07/18/2018 14:28    Cardiac Studies   ECHO   1. The left ventricle has normal systolic function, with an ejection fraction of 55-60%. The cavity size was normal. Left  ventricular diastolic function could not be evaluated secondary to atrial fibrillation.  2. The right ventricle has normal systolic function. The cavity was normal. There is no increase in right ventricular wall thickness.  3. No stenosis of the aortic valve.  4. The aortic root and ascending aorta are normal in size and structure.  Patient Profile     68 y.o. male is a 68 y.o. male with a hx of CAD with stent to LAD 2016 and POBA t 1st diag, HTN, HLD & DM-2. who is being seen today for the evaluation of a fib with RVR at the request of Dr. Fredricka Bonine.  Assessment & Plan    CAD:    No evidence of ACS.  Slight troponin elevation as below.    ATRIAL FIB:    Heart rate somewhat labile.  I will stop the IV Cardizem and add PO in IR form and change to CD possibly tomorrow   ELEVATED TROPONIN:  Elevated troponin thought to be demand ischemia.  EKG without acute changes.  No change in therapy.    Perhaps we will follow at some point with a stress test as an out patient.    For questions or updates, please contact [facility] Please consult [email or web address] for contact info under Cardiology/STEMI.   Signed, Rollene Rotunda, MD  07/19/2018, 12:07 PM

## 2018-07-19 NOTE — Progress Notes (Signed)
   Subjective/Chief Complaint: Patient without complaint.  He went into A. fib yesterday and was evaluated by cardiology.  His rate is better controlled today.  His wife was contacted yesterday about his change in status.  Once his rate is controlled he will be discharged.  He feels well.  He denies any chest pain or shortness of breath this morning.  He denies headache.   Objective: Vital signs in last 24 hours: Temp:  [97.8 F (36.6 C)-98.6 F (37 C)] 98 F (36.7 C) (05/09 0811) Pulse Rate:  [71-156] 84 (05/09 0453) Resp:  [14-29] 28 (05/09 0453) BP: (110-136)/(60-103) 122/65 (05/09 0811) SpO2:  [92 %-98 %] 96 % (05/09 0453) Last BM Date: 07/14/18  Intake/Output from previous day: 05/08 0701 - 05/09 0700 In: 840 [P.O.:720; I.V.:120] Out: 3800 [Urine:3800] Intake/Output this shift: No intake/output data recorded.  General appearance: alert and cooperative Head: Normocephalic, without obvious abnormality, atraumatic Cardio: Intermittent A. fib admixed with PVCs rate 85 GI: soft, non-tender; bowel sounds normal; no masses,  no organomegaly Neurologic: Grossly normal  Lab Results:  Recent Labs    07/18/18 1401  WBC 6.1  HGB 12.7*  HCT 37.7*  PLT 194   BMET Recent Labs    07/18/18 1401  NA 136  K 4.8  CL 101  CO2 18*  GLUCOSE 361*  BUN 31*  CREATININE 1.09  CALCIUM 8.7*   PT/INR No results for input(s): LABPROT, INR in the last 72 hours. ABG No results for input(s): PHART, HCO3 in the last 72 hours.  Invalid input(s): PCO2, PO2  Studies/Results: Dg Chest Port 1 View  Result Date: 07/18/2018 CLINICAL DATA:  Shortness of breath.  Rib fracture. EXAM: PORTABLE CHEST 1 VIEW COMPARISON:  07/14/2018 FINDINGS: 1359 hours. Low volumes with asymmetric elevation right hemidiaphragm. The cardio pericardial silhouette is enlarged. There is pulmonary vascular congestion without overt pulmonary edema. No evidence for pneumothorax or substantial pleural effusion. There is  some atelectasis at the bases. Posterior sixth and seventh rib fractures seen on recent CT not readily evident on x-ray. IMPRESSION: Low lung volumes with basilar atelectasis. No evidence for pneumothorax. Electronically Signed   By: Kennith Center M.D.   On: 07/18/2018 14:28    Anti-infectives: Anti-infectives (From admission, onward)   None      Assessment/Plan: Fall SDH/SAH- f/u head CT5/5stable, f/u CT today stable, f/u NS in 2 weeks R 6-7 rib fractures- multimodal pain control, IS, pulm toilet On chronic anticoagulation- holding plavix/ASA for now HTN- home meds T2DM- SSI A fib- cardiology following  Once rate controlled will discharge home  No anticoagulation due to SDH for 4 weeks  FEN: CM diet VTE: SCDs ID: no abx   LOS: 5 days    Maisie Fus A Rande Dario 07/19/2018

## 2018-07-19 NOTE — Significant Event (Signed)
Notified by RN that patient's troponin was elevated.   Patient being followed by cardiology for atrial fibrillation with RVR in the setting of recent trauma with known SAH and SDH. He was started on an IV diltiazem drip with improvement in rate control. Troponins were cycled in this setting and were noted to be <0.03 @ 2PM and subsequently 0.16 @ 8 PM.   Patient with known CAD and previously on DAPT. Per cardiology consult earlier today no concern for recent angina. In setting of intracranial hemorrhage, do not recommend anticoagulation or antiplatelet agents. Myocardial injury may be in the setting of demand ischemia from rapid ventricular rates.   I recommended: 1. Repeat EKG to assess for interval ischemic changes.  2. Would cycle additional troponin for trend.  3. No AC or DAPT given intracranial hemorrhage.   RN to notify cardiology if patient develops CP, shortness of breath, or other concerning symptoms.   Lauren K. Charna Busman, MD

## 2018-07-20 DIAGNOSIS — I4891 Unspecified atrial fibrillation: Secondary | ICD-10-CM

## 2018-07-20 LAB — GLUCOSE, CAPILLARY
Glucose-Capillary: 150 mg/dL — ABNORMAL HIGH (ref 70–99)
Glucose-Capillary: 251 mg/dL — ABNORMAL HIGH (ref 70–99)
Glucose-Capillary: 245 mg/dL — ABNORMAL HIGH (ref 70–99)

## 2018-07-20 MED ORDER — DILTIAZEM HCL 60 MG PO TABS
30.0000 mg | ORAL_TABLET | Freq: Once | ORAL | Status: AC
  Administered 2018-07-20: 30 mg via ORAL

## 2018-07-20 MED ORDER — DILTIAZEM HCL 60 MG PO TABS
60.0000 mg | ORAL_TABLET | Freq: Four times a day (QID) | ORAL | Status: DC
  Administered 2018-07-20 – 2018-07-21 (×4): 60 mg via ORAL
  Filled 2018-07-20 (×4): qty 1

## 2018-07-20 NOTE — Progress Notes (Signed)
Paged Card MD oncall. Pt HR sustaining in 150-160s. No SOB, CP, or dizziness. BP 151/83. New order received. Will follow through and continue to monitor.

## 2018-07-20 NOTE — Progress Notes (Addendum)
[facility] Progress Note     Subjective: CC-  Up in chair eating breakfast. Continues to have pain from rib fractures, but overall doing ok. Denies abdominal pain, n/v. Last BM 2 days ago. Pt HR sustaining in 150-160s this AM. Cardiology was called and gave cardizem 30mg . Patient denies CP, SOB.  Objective: Vital signs in last 24 hours: Temp:  [98.1 F (36.7 C)-98.3 F (36.8 C)] 98.3 F (36.8 C) (05/10 0741) Pulse Rate:  [77-124] 113 (05/10 0741) Resp:  [13-25] 25 (05/10 0741) BP: (108-147)/(60-122) 130/82 (05/10 0741) SpO2:  [94 %-98 %] 95 % (05/10 0741) Last BM Date: 07/19/18  Intake/Output from previous day: 05/09 0701 - 05/10 0700 In: 967.9 [P.O.:420; I.V.:547.9] Out: 2500 [Urine:2500] Intake/Output this shift: No intake/output data recorded.  PE: Gen:  Alert, NAD HEENT: EOM's intact, pupils equal and round Card:  Irregularly irregular Pulm:  CTAB, no W/R/R, effort normal Abd: Soft, protuberant, NT, +BS, no HSM Ext: calves soft and nontender without edema Psych: A&Ox3, normal speech Skin: no rashes noted, warm and dry  Lab Results:  Recent Labs    07/18/18 1401  WBC 6.1  HGB 12.7*  HCT 37.7*  PLT 194   BMET Recent Labs    07/18/18 1401  NA 136  K 4.8  CL 101  CO2 18*  GLUCOSE 361*  BUN 31*  CREATININE 1.09  CALCIUM 8.7*   PT/INR No results for input(s): LABPROT, INR in the last 72 hours. CMP     Component Value Date/Time   NA 136 07/18/2018 1401   K 4.8 07/18/2018 1401   CL 101 07/18/2018 1401   CO2 18 (L) 07/18/2018 1401   GLUCOSE 361 (H) 07/18/2018 1401   BUN 31 (H) 07/18/2018 1401   CREATININE 1.09 07/18/2018 1401   CALCIUM 8.7 (L) 07/18/2018 1401   PROT 6.5 07/14/2018 0153   ALBUMIN 4.1 07/14/2018 0153   AST 36 07/14/2018 0153   ALT 41 07/14/2018 0153   ALKPHOS 48 07/14/2018 0153   BILITOT 0.8 07/14/2018 0153   GFRNONAA >60 07/18/2018 1401   GFRAA >60 07/18/2018 1401   Lipase  No results found for:  LIPASE     Studies/Results: Dg Chest Port 1 View  Result Date: 07/18/2018 CLINICAL DATA:  Shortness of breath.  Rib fracture. EXAM: PORTABLE CHEST 1 VIEW COMPARISON:  07/14/2018 FINDINGS: 1359 hours. Low volumes with asymmetric elevation right hemidiaphragm. The cardio pericardial silhouette is enlarged. There is pulmonary vascular congestion without overt pulmonary edema. No evidence for pneumothorax or substantial pleural effusion. There is some atelectasis at the bases. Posterior sixth and seventh rib fractures seen on recent CT not readily evident on x-ray. IMPRESSION: Low lung volumes with basilar atelectasis. No evidence for pneumothorax. Electronically Signed   By: Kennith CenterEric  Mansell M.D.   On: 07/18/2018 14:28    Anti-infectives: Anti-infectives (From admission, onward)   None       Assessment/Plan Fall SDH/SAH- f/u head CT5/5stable, f/u CT 5/7 stable, f/u NS in 2 weeks R 6-7 rib fractures- multimodal pain control, IS, pulm toilet On chronic anticoagulation- holding plavix/ASA for now HTN- home meds T2DM- SSI A fib - cardiology following. HR sustained 150-160s again this AM, cardizem 30mg  given per cardiology. Once rate controlled will discharge home. No anticoagulation due to SDH for 4 weeks   FEN: CM diet VTE: SCDs ID: no abx Foley: none POC: Doreatha MassedLynda Watford [phone])  Dispo: Continue therapies/mobilization. Once heart rate controlled will discharge home, appreciate cardiology assistance.  Updated patient's  wife that he will not be discharged today.   LOS: 6 days    Franne Forts , [facility] 07/20/2018, 9:35 AM Pager: [phone] Mon-Thurs 7:00 am-4:30 pm Fri 7:00 am -11:30 AM Sat-Sun 7:00 am-11:30 am

## 2018-07-20 NOTE — Progress Notes (Signed)
Progress Note  Patient Name: [name] Frank Date of Encounter: 07/20/2018  Primary Cardiologist:   Hedwig MortonOHRBECK,STEVEN C, MD   Subjective   He had some rapid rate atrial fib this morning and was given increased Cardizem.  No symptoms.  He continues to have rib pain.   Inpatient Medications    Scheduled Meds: . acetaminophen  650 mg Oral Q6H  . atorvastatin  80 mg Oral Daily  . diltiazem  30 mg Oral Q6H  . docusate sodium  100 mg Oral BID  . gabapentin  300 mg Oral TID  . guaiFENesin  600 mg Oral BID  . insulin aspart  0-15 Units Subcutaneous TID WC  . insulin aspart  0-5 Units Subcutaneous QHS  . losartan  50 mg Oral Daily  . methocarbamol  750 mg Oral TID  . metoprolol succinate  50 mg Oral Daily  . polyethylene glycol  17 g Oral Daily   Continuous Infusions: . 0.9 % NaCl with KCl 20 mEq / L Stopped (07/20/18 0943)   PRN Meds: metoprolol tartrate, morphine injection, morphine injection, ondansetron **OR** ondansetron (ZOFRAN) IV, traMADol   Vital Signs    Vitals:   07/20/18 0556 07/20/18 0741 07/20/18 0927 07/20/18 1109  BP: 139/76 130/82 (!) 151/83 121/71  Pulse:  (!) 113 78 (!) 121  Resp:  (!) 25 (!) 27 (!) 24  Temp:  98.3 F (36.8 C)  97.8 F (36.6 C)  TempSrc:  Oral  Oral  SpO2:  95% 99% 96%  Weight:      Height:        Intake/Output Summary (Last 24 hours) at 07/20/2018 1310 Last data filed at 07/20/2018 1000 Gross per 24 hour  Intake 1393.71 ml  Output 2000 ml  Net -606.29 ml   Filed Weights   07/14/18 0153 07/14/18 0658  Weight: 113.4 kg 107.5 kg    Telemetry    Atrial fib with rapid rate at times - Personally Reviewed  ECG    NA  Physical Exam   GEN: No  acute distress.   Neck: No  JVD Cardiac: Irregular RR, no murmurs, rubs, or gallops.  Respiratory: Clear   to auscultation bilaterally. GI: Soft, nontender, non-distended, normal bowel sounds  MS:  No edema; No deformity. Neuro:   Nonfocal  Psych: Oriented and appropriate    Labs     Chemistry Recent Labs  Lab 07/14/18 0153 07/16/18 0816 07/18/18 1401  NA 137 137 136  K 4.0 4.4 4.8  CL 104 106 101  CO2 22 18* 18*  GLUCOSE 283* 124* 361*  BUN 26* 21 31*  CREATININE 1.13 0.79 1.09  CALCIUM 9.0 8.2* 8.7*  PROT 6.5  --   --   ALBUMIN 4.1  --   --   AST 36  --   --   ALT 41  --   --   ALKPHOS 48  --   --   BILITOT 0.8  --   --   GFRNONAA >60 >60 >60  GFRAA >60 >60 >60  ANIONGAP 11 13 17*     Hematology Recent Labs  Lab 07/14/18 0153 07/16/18 0816 07/18/18 1401  WBC 9.4 7.2 6.1  RBC 5.37 4.16* 4.30  HGB 15.7 12.4* 12.7*  HCT 47.8 37.3* 37.7*  MCV 89.0 89.7 87.7  MCH 29.2 29.8 29.5  MCHC 32.8 33.2 33.7  RDW 13.2 13.5 13.4  PLT 189 159 194    Cardiac Enzymes Recent Labs  Lab 07/18/18 1401 07/18/18 2034 07/19/18  0120  TROPONINI <0.03 0.16* 0.11*   No results for input(s): TROPIPOC in the last 168 hours.   BNPNo results for input(s): BNP, PROBNP in the last 168 hours.   DDimer No results for input(s): DDIMER in the last 168 hours.   Radiology    Dg Chest Port 1 View  Result Date: 07/18/2018 CLINICAL DATA:  Shortness of breath.  Rib fracture. EXAM: PORTABLE CHEST 1 VIEW COMPARISON:  07/14/2018 FINDINGS: 1359 hours. Low volumes with asymmetric elevation right hemidiaphragm. The cardio pericardial silhouette is enlarged. There is pulmonary vascular congestion without overt pulmonary edema. No evidence for pneumothorax or substantial pleural effusion. There is some atelectasis at the bases. Posterior sixth and seventh rib fractures seen on recent CT not readily evident on x-ray. IMPRESSION: Low lung volumes with basilar atelectasis. No evidence for pneumothorax. Electronically Signed   By: Kennith Center M.D.   On: 07/18/2018 14:28    Cardiac Studies   ECHO   1. The left ventricle has normal systolic function, with an ejection fraction of 55-60%. The cavity size was normal. Left ventricular diastolic function could not be evaluated secondary  to atrial fibrillation.  2. The right ventricle has normal systolic function. The cavity was normal. There is no increase in right ventricular wall thickness.  3. No stenosis of the aortic valve.  4. The aortic root and ascending aorta are normal in size and structure.  Patient Profile     68 y.o. male is a 68 y.o. male with a hx of CAD with stent to LAD 2016 and POBA t 1st diag, HTN, HLD & DM-2. who is being seen today for the evaluation of a fib with RVR at the request of Dr. Fredricka Bonine.  Assessment & Plan    CAD:    No evidence of ACS.  Slight troponin elevation as below but flat trend.    ATRIAL FIB:    Increase Cardizem PO to 60 q 6 hours and change to CD possibly in the AM.   We need to know from trauma when it will be OK to start Eliquis.    ELEVATED TROPONIN:  Elevated troponin thought to be demand ischemia. Probable out patient perfusion study.    QUESTIONABLE SYNCOPE:  This was not witnessed and he does not recall events and it is not clear if he had syncope.  No clear etiology for events identified.     For questions or updates, please contact [facility] Please consult [email or web address] for contact info under Cardiology/STEMI.   Signed, Rollene Rotunda, MD  07/20/2018, 1:10 PM

## 2018-07-21 LAB — GLUCOSE, CAPILLARY
Glucose-Capillary: 270 mg/dL — ABNORMAL HIGH (ref 70–99)
Glucose-Capillary: 179 mg/dL — ABNORMAL HIGH (ref 70–99)
Glucose-Capillary: 231 mg/dL — ABNORMAL HIGH (ref 70–99)
Glucose-Capillary: 264 mg/dL — ABNORMAL HIGH (ref 70–99)
Glucose-Capillary: 252 mg/dL — ABNORMAL HIGH (ref 70–99)

## 2018-07-21 MED ORDER — DILTIAZEM HCL 60 MG PO TABS
90.0000 mg | ORAL_TABLET | Freq: Four times a day (QID) | ORAL | Status: DC
  Administered 2018-07-21 – 2018-07-22 (×3): 90 mg via ORAL
  Filled 2018-07-21 (×3): qty 2

## 2018-07-21 MED ORDER — DILTIAZEM HCL 60 MG PO TABS
30.0000 mg | ORAL_TABLET | ORAL | Status: AC
  Administered 2018-07-21: 30 mg via ORAL
  Filled 2018-07-21: qty 1

## 2018-07-21 NOTE — Progress Notes (Signed)
Patient ID: [name] Frank, male   DOB: 04-Feb-1951, 68 y.o.   MRN: [id number]       Subjective: Patient complains of some back pain from his bed.  Otherwise he is just ready to go home.  Objective: Vital signs in last 24 hours: Temp:  [97.8 F (36.6 C)-98.4 F (36.9 C)] 98.1 F (36.7 C) (05/11 0813) Pulse Rate:  [78-121] 96 (05/11 0813) Resp:  [20-27] 20 (05/11 0000) BP: (121-151)/(71-83) 132/76 (05/11 0813) SpO2:  [96 %-99 %] 97 % (05/11 0813) Weight:  [115.3 kg] 115.3 kg (05/11 0400) Last BM Date: 07/19/18  Intake/Output from previous day: 05/10 0701 - 05/11 0700 In: 785.8 [P.O.:600; I.V.:185.8] Out: 3200 [Urine:3200] Intake/Output this shift: No intake/output data recorded.  PE: Gen: NAD, sitting up in his chair Heart: irregularly irregular, tachy in the 110s-140s Lungs: CTAB Abd: soft, NT, ND, +BS Ext: NVI  Lab Results:  Recent Labs    07/18/18 1401  WBC 6.1  HGB 12.7*  HCT 37.7*  PLT 194   BMET Recent Labs    07/18/18 1401  NA 136  K 4.8  CL 101  CO2 18*  GLUCOSE 361*  BUN 31*  CREATININE 1.09  CALCIUM 8.7*   PT/INR No results for input(s): LABPROT, INR in the last 72 hours. CMP     Component Value Date/Time   NA 136 07/18/2018 1401   K 4.8 07/18/2018 1401   CL 101 07/18/2018 1401   CO2 18 (L) 07/18/2018 1401   GLUCOSE 361 (H) 07/18/2018 1401   BUN 31 (H) 07/18/2018 1401   CREATININE 1.09 07/18/2018 1401   CALCIUM 8.7 (L) 07/18/2018 1401   PROT 6.5 07/14/2018 0153   ALBUMIN 4.1 07/14/2018 0153   AST 36 07/14/2018 0153   ALT 41 07/14/2018 0153   ALKPHOS 48 07/14/2018 0153   BILITOT 0.8 07/14/2018 0153   GFRNONAA >60 07/18/2018 1401   GFRAA >60 07/18/2018 1401   Lipase  No results found for: LIPASE     Studies/Results: No results found.  Anti-infectives: Anti-infectives (From admission, onward)   None       Assessment/Plan Fall SDH/SAH- f/u head CT5/5stable, f/u CT 5/7 stable, f/u NS in 2 weeks R 6-7 rib fractures-  multimodal pain control, IS, pulm toilet On chronic anticoagulation- holding plavix/ASA for now, I spoke to NS today who said the patient can NOT be start on eliquis because he is too high risk due to the large SDH he has, but may be started on ASA HTN- home meds T2DM- SSI, moderate A fib - cardiology following. Making adjustments to meds this am, still in a fib with rvr.  At least 4 weeks prior to anticoagulation  FEN: CM diet VTE: SCDs ID: no abx Foley: none POC: Orell Palomares [phone], [phone])  Dispo: Continue therapies/mobilization. Once heart rate controlled will discharge home, appreciate cardiology assistance.  Updated patient's wife that he will not be discharged today.   LOS: 7 days    Letha Cape , [address] 07/21/2018, 9:13 AM Pager: [phone]

## 2018-07-21 NOTE — Progress Notes (Signed)
Inpatient Diabetes Program Recommendations  AACE/ADA: New Consensus Statement on Inpatient Glycemic Control (2015)  Target Ranges:  Prepandial:   less than 140 mg/dL      Peak postprandial:   less than 180 mg/dL (1-2 hours)      Critically ill patients:  140 - 180 mg/dL   Lab Results  Component Value Date   GLUCAP 179 (H) 07/21/2018   HGBA1C 8.6 (H) 07/14/2018    Review of Glycemic Control Results for [name] Frank, [name] Frank (MRN [id number]) as of 07/21/2018 12:10  Ref. Range 07/20/2018 12:08 07/20/2018 16:08 07/20/2018 22:34 07/21/2018 08:09  Glucose-Capillary Latest Ref Range: 70 - 99 mg/dL 601 (H) 561 (H) 537 (H) 179 (H)   Diabetes history: Type 2 DM Outpatient Diabetes medications: Glipizide 10 mg QD, Invokamet [phone] mg BID Current orders for Inpatient glycemic control: Novolog 0-15 units TID, Novolog 0-5 units QHS  Inpatient Diabetes Program Recommendations:   If to remain inpatient, consider adding meal coverage as his glucose increases following meals and post prandials are exceeding 180 mg/dL. Consider Novolog 3 units TID (assuming that patient is consuming >50% of meal).  Thanks, Lujean Rave, MSN, RNC-OB Diabetes Coordinator [phone] (8a-5p)

## 2018-07-21 NOTE — Progress Notes (Signed)
Spoke to wife and gave her updates on the patient's status.  She request a catholic priest chaplain to come bless him.  I will write for that.  Letha Cape 9:48 AM 07/21/2018

## 2018-07-21 NOTE — Progress Notes (Signed)
   07/21/18 1700  Clinical Encounter Type  Visited With Health care provider;Patient and family together;Patient  Visit Type Initial;Follow-up;Psychological support;Spiritual support;Social support  Referral From Nurse;Family  Spiritual Encounters  Spiritual Needs Emotional;Prayer  Stress Factors  Patient Stress Factors Exhausted;Loss of control;Major life changes  Family Stress Factors Loss of control;Major life changes;Lack of knowledge   Met w/ pt twice, before lunch and then in early afternoon.  Pt's wife had asked RN via telephone for chaplain to be consulted.  Let pt know we didn't have to do anything he didn't want; pt wanted to do prayer/blessing b/c it was important to his wife.  Spoke w/ wife via pt's speakerphone both visits, at desire of pt w/ pt also in conversation.    Prayed w/ pt for strength on path of recovery and for his wife.  Pt concerned b/c he took care of bill paying and now having to provide directions to spouse over telephone.  Pt also noted recent changes from offspring at home, to "empty nest," to them returning and then leaving again.  Myra Gianotti resident, [phone]

## 2018-07-21 NOTE — Care Management Important Message (Signed)
Important Message  Patient Details  Name: [name] Frank MRN: [id number] Date of Birth: [birth year]   Medicare Important Message Given:  Yes    Dorena Bodo 07/21/2018, 4:07 PM

## 2018-07-21 NOTE — Progress Notes (Signed)
Physical Therapy Treatment Patient Details Name: [name] Frank MRN: [id number] DOB: [birth year] Today's Date: 07/21/2018    History of Present Illness Patient is a 68 yo M s/p fall on anticoagulation with resultant SDH, SAH, R 6-7 ribs fx    PT Comments    Patient seen for activity progression, remains limited and requires more physical assist today due to pain per patient. Patient with poor safety awareness and insight, at this time, feel patient will need Home health services upon discharge, recommendations updated.   OF NOTE: HR elevations to 150s with limited activity. Patient left with nursing tech for bathing/hygiene purposes.  Follow Up Recommendations  Home health PT;Supervision/Assistance - 24 hour     Equipment Recommendations  Rolling walker with 5" wheels;3in1 (PT)    Recommendations for Other Services       Precautions / Restrictions Precautions Precautions: Fall Restrictions Weight Bearing Restrictions: No    Mobility  Bed Mobility               General bed mobility comments: Received up in chair  Transfers Overall transfer level: Needs assistance Equipment used: Rolling walker (2 wheeled) Transfers: Sit to/from Stand Sit to Stand: Min assist         General transfer comment: Min assist to power up to standing with RW  Ambulation/Gait Ambulation/Gait assistance: Min assist Gait Distance (Feet): 30 Feet Assistive device: Rolling walker (2 wheeled) Gait Pattern/deviations: Step-through pattern;Decreased dorsiflexion - right;Decreased dorsiflexion - left;Trunk flexed     General Gait Details: Noted impuslivity and poor safety awareness requiring some assist.    Stairs             Wheelchair Mobility    Modified Rankin (Stroke Patients Only) Modified Rankin (Stroke Patients Only) Pre-Morbid Rankin Score: No symptoms Modified Rankin: Moderately severe disability     Balance Overall balance assessment: Needs assistance;History of  Falls Sitting-balance support: Feet supported Sitting balance-Leahy Scale: Good     Standing balance support: No upper extremity supported Standing balance-Leahy Scale: Poor Standing balance comment: posterior LOB despite UE support                            Cognition Arousal/Alertness: Awake/alert Behavior During Therapy: WFL for tasks assessed/performed Overall Cognitive Status: Impaired/Different from baseline Area of Impairment: Attention;Memory;Following commands;Awareness;Problem solving;Rancho level               Rancho Levels of Cognitive Functioning Rancho Los Amigos Scales of Cognitive Functioning: Automatic/appropriate   Current Attention Level: Selective Memory: Decreased short-term memory Following Commands: Follows one step commands consistently Safety/Judgement: Decreased awareness of safety;Decreased awareness of deficits Awareness: Emergent;Intellectual Problem Solving: Requires verbal cues;Requires tactile cues;Slow processing;Difficulty sequencing General Comments: patient with decreased insight and awareness, perseverating on not understanding why they have to keep the wires on him despite several attempts to educate on purpose of heart monitor and concerned for elevated HR      Exercises      General Comments        Pertinent Vitals/Pain Pain Assessment: 0-10 Pain Score: 7  Pain Location: back Pain Descriptors / Indicators: Aching;Grimacing;Guarding Pain Intervention(s): Monitored during session    Home Living                      Prior Function            PT Goals (current goals can now be found in the care plan section) Acute Rehab  PT Goals Patient Stated Goal: to go back to work  PT Goal Formulation: With patient Time For Goal Achievement: 07/29/18 Potential to Achieve Goals: Good Progress towards PT goals: Not progressing toward goals - comment(limited by elevated HR and pain)    Frequency    Min  3X/week      PT Plan Discharge plan needs to be updated    Co-evaluation              AM-PAC PT "6 Clicks" Mobility   Outcome Measure  Help needed turning from your back to your side while in a flat bed without using bedrails?: A Lot Help needed moving from lying on your back to sitting on the side of a flat bed without using bedrails?: A Little Help needed moving to and from a bed to a chair (including a wheelchair)?: A Little Help needed standing up from a chair using your arms (e.g., wheelchair or bedside chair)?: A Little Help needed to walk in hospital room?: A Little Help needed climbing 3-5 steps with a railing? : A Little 6 Click Score: 17    End of Session Equipment Utilized During Treatment: Gait belt Activity Tolerance: Patient tolerated treatment well Patient left: in bathroom with nursing tech for bathing Nurse Communication: Mobility status PT Visit Diagnosis: Unsteadiness on feet (R26.81);Difficulty in walking, not elsewhere classified (R26.2);Other symptoms and signs involving the nervous system (R60.454(R29.898)     Time: [id number] PT Time Calculation (min) (ACUTE ONLY): 20 min  Charges:  $Therapeutic Activity: 8-22 mins                     Charlotte Crumbevon Ernestyne Caldwell, PT DPT  Board Certified Neurologic Specialist Acute Rehabilitation Services Pager [phone] Office [phone]    Fabio AsaDevon J Tarryn Bogdan 07/21/2018, 9:32 AM

## 2018-07-21 NOTE — Progress Notes (Signed)
Progress Note  Patient Name: [name] Frank Date of Encounter: 07/21/2018  Primary Cardiologist: Hedwig Morton, MD ([place])  Subjective   No significant overnight events. He is feeling well overall this AM. Does have rib pain and back pain, especially when he tries to move.  Inpatient Medications    Scheduled Meds:  acetaminophen  650 mg Oral Q6H   atorvastatin  80 mg Oral Daily   diltiazem  30 mg Oral NOW   diltiazem  90 mg Oral Q6H   docusate sodium  100 mg Oral BID   gabapentin  300 mg Oral TID   guaiFENesin  600 mg Oral BID   insulin aspart  0-15 Units Subcutaneous TID WC   insulin aspart  0-5 Units Subcutaneous QHS   losartan  50 mg Oral Daily   methocarbamol  750 mg Oral TID   metoprolol succinate  50 mg Oral Daily   polyethylene glycol  17 g Oral Daily   Continuous Infusions:  PRN Meds: metoprolol tartrate, morphine injection, morphine injection, ondansetron **OR** ondansetron (ZOFRAN) IV, traMADol   Vital Signs    Vitals:   07/21/18 0000 07/21/18 0400 07/21/18 0813 07/21/18 1133  BP: 129/81  132/76 131/80  Pulse: 89  96 75  Resp: 20   (!) 23  Temp: 98.3 F (36.8 C) 98.4 F (36.9 C) 98.1 F (36.7 C) 97.8 F (36.6 C)  TempSrc: Oral Oral Oral Oral  SpO2: 96%  97% 100%  Weight:  115.3 kg    Height:        Intake/Output Summary (Last 24 hours) at 07/21/2018 1356 Last data filed at 07/21/2018 1254 Gross per 24 hour  Intake 600 ml  Output 2851 ml  Net -2251 ml   Last 3 Weights 07/21/2018 07/14/2018 07/14/2018  Weight (lbs) 254 lb 3.1 oz 236 lb 15.9 oz 250 lb  Weight (kg) 115.3 kg 107.5 kg 113.399 kg      Telemetry    Had been well controlled atrial fib in the 80s-90s, but gradually increased over time, currently in the 110s to 120s - Personally Reviewed  ECG    No new ECG tracings today. - Personally Reviewed  Physical Exam   GEN: No acute distress.   Neck: No JVD Cardiac: tachycardic and irregularly irregular, no appreciable  m/r/g Respiratory: Clear to auscultation bilaterally. GI: Soft, nontender, non-distended  MS: No edema; No deformity. Neuro:  Nonfocal  Psych: Normal affect   Labs    Chemistry Recent Labs  Lab 07/16/18 0816 07/18/18 1401  NA 137 136  K 4.4 4.8  CL 106 101  CO2 18* 18*  GLUCOSE 124* 361*  BUN 21 31*  CREATININE 0.79 1.09  CALCIUM 8.2* 8.7*  GFRNONAA >60 >60  GFRAA >60 >60  ANIONGAP 13 17*     Hematology Recent Labs  Lab 07/16/18 0816 07/18/18 1401  WBC 7.2 6.1  RBC 4.16* 4.30  HGB 12.4* 12.7*  HCT 37.3* 37.7*  MCV 89.7 87.7  MCH 29.8 29.5  MCHC 33.2 33.7  RDW 13.5 13.4  PLT 159 194    Cardiac Enzymes Recent Labs  Lab 07/18/18 1401 07/18/18 2034 07/19/18 0120  TROPONINI <0.03 0.16* 0.11*   No results for input(s): TROPIPOC in the last 168 hours.   BNPNo results for input(s): BNP, PROBNP in the last 168 hours.   DDimer No results for input(s): DDIMER in the last 168 hours.   Radiology    No results found.  Cardiac Studies   Echocardiogram 07/18/2018:  Impressions:  1. The left ventricle has normal systolic function, with an ejection fraction of 55-60%. The cavity size was normal. Left ventricular diastolic function could not be evaluated secondary to atrial fibrillation.  2. The right ventricle has normal systolic function. The cavity was normal. There is no increase in right ventricular wall thickness.  3. No stenosis of the aortic valve.  4. The aortic root and ascending aorta are normal in size and structure.  Summary: Technically difficult study. Poor echo windows. LVEF 55-60%, normal wall thickness, normal wall motion, normal LA size, IVC not well-visualized.  Patient Profile   [name] is a 68 y.o. male with a history of CAD s/p stent to LAD in 2016 and POBA to 1st Diagonal, hypertension, hyperlipidemia, and type 2 diabetes mellitus. He was admitted on 07/14/2018 after an unwitnessed fall and was found to have a subdural hematoma and rib  fractures. Cardiology was consulted on 07/18/2018 for evaluation of atrial fibrillation with RVR.  Assessment & Plan    Atrial Fibrillation with RVR - Currently on PO Cardizem 60mg  every 6 hours. I am increasing this to 90mg  q6 hours, with an additional one time 30 mg dose for now as he recently received the 60 mg dose - continue current dose of metoprolol succinate 50 mg daily. If not rate controlled on max diltiazem, will need to increase metoprolol dose. - he feels well and is asymptomatic in his RVR. BP stable - cannot cardiovert as he cannot be anticoagulated currently.  - CHA2DS-VASc = at least 4 (CAD, HTN, DM, and age). Per note from today, cannot receive anticoagulation due to high risk SDH. Aspirin is ok. - pain may be exacerbating his heart rate, encouraged him to ask for medication if he is uncomfortable.  CAD with Elevated Troponin - Patient s/p stent to LAD in 2016 and POBA to 1st Diagonal. - Patient on Aspirin and Plavix at home. Both held on admission due to subdural hematoma. Per note today ok to restart aspirin. Restart clopidogrel when ok per either trauma or neurosurgery. - Continue home statin and beta-blocker. - Troponin minimally elevated and flat at 0.16 >> 0.11. Thought to be due to demand ischemia. No evidence of ACS. May need outpatient nuclear stress test.  Questionable Syncope - This was an unwitnessed event and patient does not recall events. It is not clear if he had any syncope. No clear etiology for events identified. Echo without structural disease.  TIME SPENT WITH PATIENT: 15 minutes of direct patient care. More than 50% of that time was spent on coordination of care and counseling regarding medication adjustment plan.  [name] RedBridgette Najmo Pardue, MD, PhD [facility]   [facility]   For questions or updates, please contact [facility] Please consult [email or web address] for contact info under     Signed, [name] RedBridgette Fidel Caggiano, MD  07/21/2018, 1:56 PM

## 2018-07-22 LAB — GLUCOSE, CAPILLARY
Glucose-Capillary: 168 mg/dL — ABNORMAL HIGH (ref 70–99)
Glucose-Capillary: 224 mg/dL — ABNORMAL HIGH (ref 70–99)

## 2018-07-22 MED ORDER — DILTIAZEM HCL ER COATED BEADS 360 MG PO CP24: 360.0000 mg | ORAL_CAPSULE | Freq: Every day | ORAL | 0 refills | Status: DC

## 2018-07-22 MED ORDER — DILTIAZEM HCL ER COATED BEADS 360 MG PO CP24
360.0000 mg | ORAL_CAPSULE | Freq: Every day | ORAL | Status: DC
  Administered 2018-07-22: 360 mg via ORAL
  Filled 2018-07-22: qty 1

## 2018-07-22 MED ORDER — METOPROLOL SUCCINATE ER 100 MG PO TB24: 100.0000 mg | ORAL_TABLET | Freq: Every day | ORAL | 0 refills | Status: DC

## 2018-07-22 MED ORDER — METOPROLOL SUCCINATE ER 100 MG PO TB24
100.0000 mg | ORAL_TABLET | Freq: Every day | ORAL | Status: DC
  Administered 2018-07-22: 100 mg via ORAL
  Filled 2018-07-22: qty 1

## 2018-07-22 NOTE — TOC Transition Note (Signed)
Transition of Care [facility]) - CM/SW Discharge Note   Patient Details  Name: [name] Frank MRN: [id number] Date of Birth: 03/30/1950  [facility]) CM/SW Contact:  Glennon Mac, RN Phone Number: 07/22/2018, 1:35 PM   Clinical Narrative:   Patient is a 68 yo M s/p fall on anticoagulation with resultant SDH, SAH, R 6-7 ribs fx.  Pt medically stable for discharge home today with spouse.  Notified [facility] of dc today.  All DME has been delivered to home.      Final next level of care: Home w Home Health Services Barriers to Discharge: No Barriers Identified   Patient Goals and CMS Choice Patient states their goals for this hospitalization and ongoing recovery are:: To get back home CMS Medicare.gov Compare Post Acute Care list provided to:: Patient Choice offered to / list presented to : Spouse  Discharge Placement  Home with [facility] services                     Discharge Plan and Services   Discharge Planning Services: CM Consult Post Acute Care Choice: Home Health          DME Arranged: 3-N-1, Walker rolling, Hospital bed DME Agency: [employer] Date DME Agency Contacted: 07/17/18 Time DME Agency Contacted: 1304 Representative spoke with at DME Agency: Oletha Cruel HH Arranged: PT, OT, Speech Therapy HH Agency: [facility] Date [facility] Agency Contacted: 07/18/18 Time HH Agency Contacted: 1344 Representative spoke with at [facility] Agency: Eugenio Hoes  Social Determinants of Health (SDOH) Interventions     Readmission Risk Interventions Readmission Risk Prevention Plan 07/18/2018  Post Dischage Appt Not Complete  Appt Comments PCP refused to set up with RN Case manager; prefers to call pt  Medication Screening Complete  Transportation Screening Complete  Some recent data might be hidden   Quintella Baton, RN, BSN  Trauma/Neuro ICU Case Manager [phone]

## 2018-07-22 NOTE — Progress Notes (Signed)
Inpatient Diabetes Program Recommendations  AACE/ADA: New Consensus Statement on Inpatient Glycemic Control (2015)  Target Ranges:  Prepandial:   less than 140 mg/dL      Peak postprandial:   less than 180 mg/dL (1-2 hours)      Critically ill patients:  140 - 180 mg/dL   Lab Results  Component Value Date   GLUCAP 168 (H) 07/22/2018   HGBA1C 8.6 (H) 07/14/2018    Review of Glycemic Control Results for DHAIRYA, BODART (MRN [id number]) as of 07/22/2018 10:51  Ref. Range 07/21/2018 12:00 07/21/2018 15:46 07/21/2018 21:11 07/22/2018 07:26  Glucose-Capillary Latest Ref Range: 70 - 99 mg/dL 211 (H) 173 (H) 567 (H) 168 (H)   Diabetes history: Type 2 DM Outpatient Diabetes medications: Glipizide 10 mg QD, Invokamet [phone] mg BID Current orders for Inpatient glycemic control: Novolog 0-15 units TID, Novolog 0-5 units QHS  Inpatient Diabetes Program Recommendations:   If to remain inpatient, consider adding meal coverage as his glucose increases following meals and post prandials are exceeding 180 mg/dL. Consider Novolog 3 units TID (assuming that patient is consuming >50% of meal).  Thanks, Lujean Rave, MSN, RNC-OB Diabetes Coordinator [phone] (8a-5p)

## 2018-07-22 NOTE — Progress Notes (Signed)
Physical Therapy Treatment Patient Details Name: [name] Frank MRN: [id number] DOB: [birth year] Today's Date: 07/22/2018    History of Present Illness Patient is a 68 yo M s/p fall on anticoagulation with resultant SDH, SAH, R 6-7 ribs fx    [name] Comments    Patient seen for activity progression. Continues to demonstrate cognitive concerns. Will need HHPT and supervision upon acute discharge.  OF NOTE: Hr stable with activity today, 70s to 90s.    Follow Up Recommendations  Home health [name];Supervision/Assistance - 24 hour     Equipment Recommendations  Rolling walker with 5" wheels;3in1 ([name])    Recommendations for Other Services       Precautions / Restrictions Precautions Precautions: Fall    Mobility  Bed Mobility Overal bed mobility: Needs Assistance Bed Mobility: Rolling;Supine to Sit Rolling: Min assist   Supine to sit: Min assist     General bed mobility comments: increased time and effort to perform, max multi modal cues for technique and sequencing  Transfers Overall transfer level: Needs assistance Equipment used: Rolling walker (2 wheeled) Transfers: Sit to/from Stand Sit to Stand: Min assist         General transfer comment: Min assist to power up to standing with RW  Ambulation/Gait Ambulation/Gait assistance: Min assist Gait Distance (Feet): 160 Feet Assistive device: Rolling walker (2 wheeled) Gait Pattern/deviations: Step-through pattern;Decreased dorsiflexion - right;Decreased dorsiflexion - left;Trunk flexed     General Gait Details: Max multi modal cues to foster increased cadence and stride. patient easily distracted by environment   Stairs             Wheelchair Mobility    Modified Rankin (Stroke Patients Only)       Balance Overall balance assessment: Needs assistance;History of Falls Sitting-balance support: Feet supported Sitting balance-Leahy Scale: Good Sitting balance - Comments: able to maintain static sitting with  min guard assist    Standing balance support: No upper extremity supported Standing balance-Leahy Scale: Poor                 High Level Balance Comments: min assist required for instbaility noted with these tasks            Cognition Arousal/Alertness: Awake/alert Behavior During Therapy: WFL for tasks assessed/performed Overall Cognitive Status: Impaired/Different from baseline Area of Impairment: Attention;Memory;Following commands;Awareness;Problem solving;Rancho level               Rancho Levels of Cognitive Functioning Ranch[place] Mirant Scales of Cognitive Functioning: Automatic/appropriate Orientation Level: Disoriented to;Time Current Attention Level: Selective Memory: Decreased short-term memory Following Commands: Follows one step commands consistently;Follows multi-step commands with increased time Safety/Judgement: Decreased awareness of safety;Decreased awareness of deficits Awareness: Emergent Problem Solving: Requires verbal cues;Requires tactile cues;Slow processing;Difficulty sequencing General Comments: Patient still with poor insight into deficits and awareness despite multiple cues       Exercises      General Comments        Pertinent Vitals/Pain Pain Assessment: 0-10 Pain Location: back Pain Intervention(s): Monitored during session    Home Living                      Prior Function            [name] Goals (current goals can now be found in the care plan section) Acute Rehab [name] Goals [name] Goal Formulation: With patient Time For Goal Achievement: 07/29/18 Potential to Achieve Goals: Good Progress towards [name] goals: Progressing toward goals  Frequency    Min 3X/week      [name] Plan Current plan remains appropriate    Co-evaluation              AM-PAC [name] "6 Clicks" Mobility   Outcome Measure  Help needed turning from your back to your side while in a flat bed without using bedrails?: A Little Help needed  moving from lying on your back to sitting on the side of a flat bed without using bedrails?: A Little Help needed moving to and from a bed to a chair (including a wheelchair)?: A Little Help needed standing up from a chair using your arms (e.g., wheelchair or bedside chair)?: A Little Help needed to walk in hospital room?: A Little Help needed climbing 3-5 steps with a railing? : A Little 6 Click Score: 18    End of Session Equipment Utilized During Treatment: Gait belt Activity Tolerance: Patient tolerated treatment well Patient left: in chair;with call bell/phone within reach;with chair alarm set Nurse Communication: Mobility status [name] Visit Diagnosis: Unsteadiness on feet (R26.81);Difficulty in walking, not elsewhere classified (R26.2);Other symptoms and signs involving the nervous system (R29.898)     Time: [id number] [name] Time Calculation (min) (ACUTE ONLY): 22 min  Charges:  $Gait Training: 8-22 mins                     [name] Frank, [name] Frank  Board Certified Neurologic Specialist Acute Rehabilitation Services Pager [phone] Office [phone]    [name] Frank 07/22/2018, 1:16 PM

## 2018-07-22 NOTE — Progress Notes (Signed)
Progress Note  Patient Name: [name] Frank Date of Encounter: 07/22/2018  Primary Cardiologist: Hedwig Morton, MD ([place])  Subjective  Doing well, sitting up eating, no complaints. Looking forward to going home.  Inpatient Medications    Scheduled Meds: . acetaminophen  650 mg Oral Q6H  . atorvastatin  80 mg Oral Daily  . diltiazem  360 mg Oral Daily  . docusate sodium  100 mg Oral BID  . gabapentin  300 mg Oral TID  . guaiFENesin  600 mg Oral BID  . insulin aspart  0-15 Units Subcutaneous TID WC  . insulin aspart  0-5 Units Subcutaneous QHS  . losartan  50 mg Oral Daily  . methocarbamol  750 mg Oral TID  . metoprolol succinate  100 mg Oral Daily  . polyethylene glycol  17 g Oral Daily   Continuous Infusions:  PRN Meds: metoprolol tartrate, morphine injection, morphine injection, ondansetron **OR** ondansetron (ZOFRAN) IV, traMADol   Vital Signs    Vitals:   07/21/18 1626 07/21/18 2000 07/22/18 0000 07/22/18 0400  BP: 111/71 123/60 (!) 143/74 124/69  Pulse: 67 76 100 95  Resp: 20 (!) 23 18 18   Temp: 98.3 F (36.8 C) 97.8 F (36.6 C) 97.8 F (36.6 C) (!) 97.5 F (36.4 C)  TempSrc: Oral Oral Oral Oral  SpO2:  97% 97% 97%  Weight:      Height:        Intake/Output Summary (Last 24 hours) at 07/22/2018 1225 Last data filed at 07/22/2018 0449 Gross per 24 hour  Intake 240 ml  Output 350 ml  Net -110 ml   Last 3 Weights 07/21/2018 07/14/2018 07/14/2018  Weight (lbs) 254 lb 3.1 oz 236 lb 15.9 oz 250 lb  Weight (kg) 115.3 kg 107.5 kg 113.399 kg      Telemetry    Had been well controlled atrial fib in the 80s-90s, but gradually increased over time, currently in the 110s to 120s - Personally Reviewed  ECG    No new ECG tracings today. - Personally Reviewed  Physical Exam   GEN: No acute distress.   Neck: No JVD Cardiac: mildly tachycardic and irregularly irregular, no appreciable m/r/g Respiratory: Clear to auscultation bilaterally. GI: Soft,  nontender, non-distended  MS: No edema; No deformity. Neuro:  Nonfocal  Psych: Normal affect   Labs    Chemistry Recent Labs  Lab 07/16/18 0816 07/18/18 1401  NA 137 136  K 4.4 4.8  CL 106 101  CO2 18* 18*  GLUCOSE 124* 361*  BUN 21 31*  CREATININE 0.79 1.09  CALCIUM 8.2* 8.7*  GFRNONAA >60 >60  GFRAA >60 >60  ANIONGAP 13 17*     Hematology Recent Labs  Lab 07/16/18 0816 07/18/18 1401  WBC 7.2 6.1  RBC 4.16* 4.30  HGB 12.4* 12.7*  HCT 37.3* 37.7*  MCV 89.7 87.7  MCH 29.8 29.5  MCHC 33.2 33.7  RDW 13.5 13.4  PLT 159 194    Cardiac Enzymes Recent Labs  Lab 07/18/18 1401 07/18/18 2034 07/19/18 0120  TROPONINI <0.03 0.16* 0.11*   No results for input(s): TROPIPOC in the last 168 hours.   BNPNo results for input(s): BNP, PROBNP in the last 168 hours.   DDimer No results for input(s): DDIMER in the last 168 hours.   Radiology    No results found.  Cardiac Studies   Echocardiogram 07/18/2018: Impressions:  1. The left ventricle has normal systolic function, with an ejection fraction of 55-60%. The cavity size  was normal. Left ventricular diastolic function could not be evaluated secondary to atrial fibrillation.  2. The right ventricle has normal systolic function. The cavity was normal. There is no increase in right ventricular wall thickness.  3. No stenosis of the aortic valve.  4. The aortic root and ascending aorta are normal in size and structure.  Summary: Technically difficult study. Poor echo windows. LVEF 55-60%, normal wall thickness, normal wall motion, normal LA size, IVC not well-visualized.  Patient Profile   [name] is a 68 y.o. male with a history of CAD s/p stent to LAD in 2016 and POBA to 1st Diagonal, hypertension, hyperlipidemia, and type 2 diabetes mellitus. He was admitted on 07/14/2018 after an unwitnessed fall and was found to have a subdural hematoma and rib fractures. Cardiology was consulted on 07/18/2018 for evaluation of  atrial fibrillation with RVR.  Assessment & Plan    Atrial Fibrillation with RVR - Rates overnights in the 80s while sleeping, now around 105 bpm when awake.  -consolidating diltiazem to max dose 360 mg daily this AM -given that he is not rate controlled on max diltiazem, will increase metoprolol succinate from 50 mg to 100 mg daily -after receiving above changed doses, his resting HR is around 90 and goes up to just above 100 when moving to chair. This is acceptable. - he feels well and is asymptomatic in his RVR. BP stable - cannot cardiovert as he cannot be anticoagulated currently.  - CHA2DS-VASc = at least 4 (CAD, HTN, DM, and age). Per note from 5/11, cannot receive anticoagulation due to high risk SDH. Aspirin is ok.  CAD with Elevated Troponin - Patient s/p stent to LAD in 2016 and POBA to 1st Diagonal. - Patient on Aspirin and Plavix at home. Both held on admission due to subdural hematoma. Per prior note ok to restart aspirin. Restart clopidogrel when ok per either trauma or neurosurgery. - Continue home statin - Troponin minimally elevated and flat at 0.16 >> 0.11. Thought to be due to demand ischemia. No evidence of ACS. May need outpatient nuclear stress test.  Questionable Syncope - This was an unwitnessed event and patient does not recall events. It is not clear if he had any syncope. No clear etiology for events identified. Echo without structural disease.  [facility] will sign off.   Medication Recommendations:  Continue current doses of diltiazem, metoprolol. Continue atorvastatin and losartan (same as home doses). Restart aspirin and clopidogrel based on recommendations from trauma/neurosurgery. Once cleared from surgery standpoint, would discuss anticoagulation for afib as an outpatient. Other recommendations (labs, testing, etc):  none Follow up as an outpatient:  He [facility] need [facility] to review anticoagulation,  possible need for stress test, and review of rate control medications.  TIME SPENT WITH PATIENT: 15 minutes of direct patient care. More than 50% of that time was spent on coordination of care and counseling regarding medication adjustment plan.  Jodelle RedBridgette Siboney Requejo, MD, PhD [facility]  [facility]   For questions or updates, please contact [facility] Please consult [email or web address] for contact info under     Signed, Jodelle RedBridgette Luretta Everly, MD  07/22/2018, 12:25 PM

## 2018-07-22 NOTE — Progress Notes (Signed)
D/C education given to pt. All questions answered. No printed prescriptions to give or equipment to be delivered. IV removed. Pt taken to car with all belongings.

## 2018-07-22 NOTE — Progress Notes (Signed)
[facility] Progress Note     Subjective: CC: wants to go home Denies SOB. Not using IS much. Pain much better than last week. Patient hoping to go home today.   Objective: Vital signs in last 24 hours: Temp:  [97.5 F (36.4 C)-98.3 F (36.8 C)] 97.5 F (36.4 C) (05/12 0400) Pulse Rate:  [67-102] 95 (05/12 0400) Resp:  [18-23] 18 (05/12 0400) BP: (110-143)/(60-80) 124/69 (05/12 0400) SpO2:  [95 %-100 %] 97 % (05/12 0400) Last BM Date: 07/20/18  Intake/Output from previous day: 05/11 0701 - 05/12 0700 In: 480 [P.O.:480] Out: 851 [Urine:850; Stool:1] Intake/Output this shift: No intake/output data recorded.  PE: Gen: NAD, sitting up in his chair Heart: irregularly irregular, HR in the 80s - 105 while I was in room  Lungs: CTAB, pulled 750 on IS Abd: soft, NT, ND, +BS Ext: NVI   Lab Results:  No results for input(s): WBC, HGB, HCT, PLT in the last 72 hours. BMET No results for input(s): NA, K, CL, CO2, GLUCOSE, BUN, CREATININE, CALCIUM in the last 72 hours. PT/INR No results for input(s): LABPROT, INR in the last 72 hours. CMP     Component Value Date/Time   NA 136 07/18/2018 1401   K 4.8 07/18/2018 1401   CL 101 07/18/2018 1401   CO2 18 (L) 07/18/2018 1401   GLUCOSE 361 (H) 07/18/2018 1401   BUN 31 (H) 07/18/2018 1401   CREATININE 1.09 07/18/2018 1401   CALCIUM 8.7 (L) 07/18/2018 1401   PROT 6.5 07/14/2018 0153   ALBUMIN 4.1 07/14/2018 0153   AST 36 07/14/2018 0153   ALT 41 07/14/2018 0153   ALKPHOS 48 07/14/2018 0153   BILITOT 0.8 07/14/2018 0153   GFRNONAA >60 07/18/2018 1401   GFRAA >60 07/18/2018 1401   Lipase  No results found for: LIPASE     Studies/Results: No results found.  Anti-infectives: Anti-infectives (From admission, onward)   None       Assessment/Plan Fall SDH/SAH- f/u head CT5/5stable, f/u CT 5/7stable, f/u NS in 2 weeks R 6-7 rib fractures- multimodal pain control, IS, pulm toilet On chronic  anticoagulation- holding plavix/ASA for now, I spoke to NS today who said the patient can NOT be start on eliquis because he is too high risk due to the large SDH he has, but may be started on ASA HTN- home meds T2DM- SSI, moderate A fib - cardiology following. Making adjustments to meds this am, still in a fib with rvr.  At least 4 weeks prior to anticoagulation  FEN: CM diet VTE: SCDs ID: no abx Foley: none POC: Jeffy Korch [phone], [phone])  Dispo: Continue therapies/mobilization.Rate control appears to be improving, home today if cleared by cardiology.    LOS: 8 days    Wells Guiles , [facility] Surgery 07/22/2018, 10:11 AM Pager: [phone]

## 2018-07-22 NOTE — Discharge Instructions (Signed)
Living With Traumatic Brain Injury Traumatic brain injury (TBI) is an injury to the brain that may be mild, moderate, or severe. Symptoms of any type of TBI can be long lasting (chronic). Depending on the area of the brain that is affected, a TBI can interfere with vision, memory, concentration, speech, balance, sense of touch, and sleep. TBI can also cause chronic symptoms like headache or dizziness. How to cope with lifestyle changes After a TBI, you may need to make changes to your lifestyle in order to recover as well as possible. How quickly and how fully you recover will depend on the severity of your injury. Your recovery plan may involve:  Working with specialists to develop a rehabilitation plan to help you return to your regular activities. Your health care team may include: ? Physical or occupational therapists. ? Speech and language pathologists. ? Mental health counselors. ? Physicians like your primary care physician or neurologist.  Taking time off work or school, depending on your injury.  Avoiding situations where there is a risk for another head injury, such as football, hockey, soccer, basketball, martial arts, downhill snow sports, and horseback riding. Do not do these activities until your health care provider approves.  Resting. Rest helps the brain to heal. Make sure you: ? Get plenty of sleep at night. Avoid staying up late at night. ? Keep the same bedtime hours on weekends and weekdays. ? Rest during the day. Take daytime naps or rest breaks when you feel tired.  Avoiding extra stress on your eyes. You may need to set time limits when working on the computer, watching TV, and reading.  Finding ways to manage stress. This may include: ? Avoiding activities that cause stress. ? Deep breathing, yoga, or meditation. ? Listening to music or spending time outdoors.  Making lists, setting reminders, or using a day planner to help your memory.  Allowing yourself plenty  of time to complete everyday tasks, such as grocery shopping, paying bills, and doing laundry.  Avoiding driving. Your ability to drive safely may be affected by your injury. ? Rely on family, friends, or a transportation service to help you get around and to appointments. ? Have a professional evaluation to check your driving ability. ? Access support services to help you return to driving. These may include training and adaptive equipment. Follow these instructions at home:  Take over-the-counter and prescription medicines only as told by your health care provider. Do not take aspirin or other anti-inflammatory medicines such as ibuprofen or naproxen unless approved by your health care provider.  Avoid large amounts of caffeine. Your body may be more sensitive to it after your injury.  Do not use any products that contain nicotine or tobacco, such as cigarettes, e-cigarettes, nicotine gum, and patches. If you need help quitting, ask your health care provider.  Do not use drugs.  Limit alcohol intake to no more than 1 drink per day for nonpregnant women and 2 drinks per day for men. One drink equals 12 ounces of beer, 5 ounces of wine, or 1 ounces of hard liquor.  Do not drive until cleared by your health care provider.  Keep all follow-up visits as told by your health care provider. This is important. Where to find support  Talk with your employer, co-workers, teachers, or school counselor about your injury. Work together to develop a plan for completing tasks while you recover.  Talk to others living with a TBI. Join a support group with other people  who have experienced a TBI.  Let your friends and family members know what they can do to help. This might include helping at home or transportation to appointments.  If you are unable to continue working after your injury, talk to a Child psychotherapist about options to help you meet your financial needs.  Seek out additional resources if  you are a Building services engineer or family member, such as: ? Development worker, international aid Injury Center: dvbic.dcoe.mil ? Department of [employer]: 763-840-0095 Questions to ask your health care provider:  How serious is my injury?  What is my rehabilitation plan?  What is my expected recovery?  When can I return to work or school?  When can I return to regular activities, including driving? Contact a health care provider if:  You have new or worsening: ? Dizziness. ? Headache. ? Anxiety or depression. ? Irritability. ? Confusion. ? Jerky movements that you cannot control (seizures). ? Extreme sensitivity to light or sound. ? Nausea or vomiting. Summary  Traumatic brain injury (TBI) is an injury to your brain that can interfere with vision, memory, concentration, speech, balance, sense of touch, and sleep. TBI can also cause chronic symptoms like headache or dizziness.  After a TBI you may need to make several changes to your lifestyle in order to recover as well as possible. How quickly and how fully you recover will depend on the severity of your injury.  Talk to your family, friends, employer, co-workers, Architectural technologist, or school counselor about your injury. Work together to develop a plan for completing tasks while you recover. This information is not intended to replace advice given to you by your health care provider. Make sure you discuss any questions you have with your health care provider. Document Released: 02/23/2016 Document Revised: 02/23/2016 Document Reviewed: 02/23/2016 Elsevier Interactive Patient Education  2019 Elsevier Inc.   Subdural Hematoma  A subdural hematoma is a collection of blood between the brain and its outer covering (dura). As the amount of blood increases, pressure builds on the brain. There are two types of subdural hematomas.  Acute. This type develops shortly after a hard, direct hit to the head and  causes blood to collect very quickly. This is a medical emergency. If it is not diagnosed and treated quickly, it can lead to severe brain injury or death.  Chronic. This is when bleeding develops more slowly, over weeks or months. In some cases, this type does not cause symptoms. What are the causes? This condition is caused by bleeding (hemorrhage) from a broken (ruptured) blood vessel. In most cases, a blood vessel ruptures and bleeds because of injury (trauma) to the head, such as from a hard, direct hit. Head trauma can happen in:  Traffic accidents.  Falls.  Assaults.  Sports injuries. In rare cases, a hemorrhage can happen without a known cause (spontaneously), especially if you take blood thinners (anticoagulants). What increases the risk? This condition is more likely to develop in:  Older people.  Infants.  People who take blood thinners.  People who have head injuries.  People who abuse alcohol. What are the signs or symptoms? Symptoms of this condition can vary depending on the size of the hematoma. Symptoms can be mild, severe, or life-threatening. They include:  Headaches.  Nausea or vomiting.  Changes in vision, such as double vision or loss of vision.  Changes in speech or trouble understanding what people say.  Loss of balance or difficulty walking.  Weakness, numbness, or tingling in the arms or legs, especially on one side of the body.  Fits of movements that you cannot control (seizures).  Change in personality.  Increased sleepiness.  Memory loss.  Loss of consciousness.  Coma. Symptoms of acute subdural hematoma can develop over minutes or hours. Symptoms of chronic subdural hematoma may develop over weeks or months. How is this diagnosed? This condition is diagnosed based on the results of:  A physical exam.  Tests of strength, reflexes, coordination, senses, manner of walking (gait), and facial and eye movements (neurological  exam).  Imaging tests, such as MRI or CT scan. How is this treated? Treatment for this condition depends on which type of hematoma you have and how severe it is. Treatment for acute hematoma may include:  Emergency surgery to drain blood or remove a blood clot.  Medicines that help the body get rid of excess fluids (diuretics). These may help to reduce pressure in the brain.  Assisted breathing (ventilation). Treatment for chronic hematoma may include:  Observation and bed rest at the hospital.  Emergency surgery. If you take blood thinners, you may need to temporarily stop taking them. You may also be given antiseizure (anticonvulsant) medicine Sometimes, no treatment is needed for chronic subdural hematoma. Follow these instructions at home: Activity  Avoid any situation where there is potential for another head injury, such as competitive sports, downhill snow sports, and horseback riding. Do not do these activities until your health care provider approves. ? Wear protective gear, such as a helmet, when participating in activities such as biking or contact sports.  Avoid excessive visual stimulation while recovering. This means limiting how much you read and limiting your screen time on a smart phone, tablet, computer, or TV.  Rest as told by your health care provider. Rest helps the brain to heal.  Try to avoid activities that cause physical or mental stress. Return to work or school as told by your health care provider.  Do not drive, ride a bicycle, or use heavy machinery until your health care provider approves.  Do not lift anything that is heavier than 5 lb (2.3 kg), or the limit you are told, until your health care provider approves. Alcohol use  Do not drink alcohol if your health care provider tells you not to drink.  If you drink alcohol, limit how much you have. ? 0-1 drink a day for women. ? 0-2 drinks a day for men. General instructions  Monitor your  symptoms, and ask people around you to do the same. Recovery from brain injuries varies widely. Talk with your health care provider about what to expect.  Take over-the-counter and prescription medicines only as told by your health care provider. Do not take blood thinners or NSAIDs unless your health care provider approves. These include aspirin, ibuprofen, naproxen, and warfarin.  Keep all follow-up visits as told by your health care provider. This is important. Where to find more information  [employer] of Neurological Disorders and Stroke: [employer].co.uk  American Academy of Neurology (AAN): ComparePet.cz  Brain Injury Association of [place]: www.biausa.org Get help right away if you:  Are taking blood thinners and you fall or you experience minor trauma to the head. If you take any blood thinners, even a very small injury can cause a subdural hematoma.  Have a bleeding disorder and you fall or you experience minor trauma to the head.  Develop any of the following symptoms after a head injury: ? Clear fluid  draining from your nose or ears. ? Nausea or vomiting. ? Changes in speech or trouble understanding what people say. ? Seizures. ? Drowsiness or a decrease in alertness. ? Double vision. ? Numbness or inability to move (paralysis) in any part of your body. ? Difficulty walking or poor coordination. ? Difficulty thinking. ? Confusion or forgetfulness. ? Personality changes. ? Irrational or aggressive behavior. These symptoms may represent a serious problem that is an emergency. Do not wait to see if the symptoms will go away. Get medical help right away. Call your local emergency services (911 in the [place] Do not drive yourself to the hospital. Summary  A subdural hematoma is a collection of blood between the brain and its outer covering (dura).  Treatment for this condition depends on what type of subdural hematoma you have and how severe it is.  Symptoms can  vary from mild to severe to life-threatening.  Monitor your symptoms, and ask others around you to do the same. This information is not intended to replace advice given to you by your health care provider. Make sure you discuss any questions you have with your health care provider. Document Released: 01/14/2004 Document Revised: 01/14/2017 Document Reviewed: 01/14/2017 Elsevier Interactive Patient Education  2019 Elsevier Inc.   Rib Fracture  A rib fracture is a break or crack in one of the bones of the ribs. The ribs are like a cage that goes around your upper chest. A broken or cracked rib is often painful, but most do not cause other problems. Most rib fractures usually heal on their own in 1-3 months. Follow these instructions at home: Managing pain, stiffness, and swelling  If directed, apply ice to the injured area. ? Put ice in a plastic bag. ? Place a towel between your skin and the bag. ? Leave the ice on for 20 minutes, 2-3 times a day.  Take over-the-counter and prescription medicines only as told by your doctor. Activity  Avoid activities that cause pain to the injured area. Protect your injured area.  Slowly increase activity as told by your doctor. General instructions  Do deep breathing as told by your doctor. You may be told to: ? Take deep breaths many times a day. ? Cough many times a day while hugging a pillow. ? Use a device (incentive spirometer) to do deep breathing many times a day.  Drink enough fluid to keep your pee (urine) clear or pale yellow.  Do not wear a rib belt or binder. These do not allow you to breathe deeply.  Keep all follow-up visits as told by your doctor. This is important. Contact a doctor if:  You have a fever. Get help right away if:  You have trouble breathing.  You are short of breath.  You cannot stop coughing.  You cough up thick or bloody spit (sputum).  You feel sick to your stomach (nauseous), throw up (vomit), or  have belly (abdominal) pain.  Your pain gets worse and medicine does not help. Summary  A rib fracture is a break or crack in one of the bones of the ribs.  Apply ice to the injured area and take medicines for pain as told by your doctor.  Take deep breaths and cough many times a day. Hug a pillow every time you cough. This information is not intended to replace advice given to you by your health care provider. Make sure you discuss any questions you have with your health care provider. Document Released: 12/06/2007  Document Revised: 05/29/2016 Document Reviewed: 05/29/2016 Elsevier Interactive Patient Education  2019 Elsevier Inc.   Atrial Fibrillation  Atrial fibrillation is a type of heartbeat that is irregular or fast (rapid). If you have this condition, your heart beats without any order. This makes it hard for your heart to pump blood in a normal way. Having this condition gives you more risk for stroke, heart failure, and other heart problems. Atrial fibrillation may start all of a sudden and then stop on its own, or it may become a long-lasting problem. What are the causes? This condition may be caused by heart conditions, such as:  High blood pressure.  Heart failure.  Heart valve disease.  Heart surgery. Other causes include:  Pneumonia.  Obstructive sleep apnea.  Lung cancer.  Thyroid disease.  Drinking too much alcohol. Sometimes the cause is not known. What increases the risk? You are more likely to develop this condition if:  You smoke.  You are older.  You have diabetes.  You are overweight.  You have a family history of this condition.  You exercise often and hard. What are the signs or symptoms? Common symptoms of this condition include:  A feeling like your heart is beating very fast.  Chest pain.  Feeling short of breath.  Feeling light-headed or weak.  Getting tired easily. Follow these instructions at home: Medicines  Take  over-the-counter and prescription medicines only as told by your doctor.  If your doctor gives you a blood-thinning medicine, take it exactly as told. Taking too much of it can cause bleeding. Taking too little of it does not protect you against clots. Clots can cause a stroke. Lifestyle      Do not use any tobacco products. These include cigarettes, chewing tobacco, and e-cigarettes. If you need help quitting, ask your doctor.  Do not drink alcohol.  Do not drink beverages that have caffeine. These include coffee, soda, and tea.  Follow diet instructions as told by your doctor.  Exercise regularly as told by your doctor. General instructions  If you have a condition that causes breathing to stop for a short period of time (apnea), treat it as told by your doctor.  Keep a healthy weight. Do not use diet pills unless your doctor says they are safe for you. Diet pills may make heart problems worse.  Keep all follow-up visits as told by your doctor. This is important. Contact a doctor if:  You notice a change in the speed, rhythm, or strength of your heartbeat.  You are taking a blood-thinning medicine and you see more bruising.  You get tired more easily when you move or exercise.  You have a sudden change in weight. Get help right away if:   You have pain in your chest or your belly (abdomen).  You have trouble breathing.  You have blood in your vomit, poop, or pee (urine).  You have any signs of a stroke. "BE FAST" is an easy way to remember the main warning signs: ? B - Balance. Signs are dizziness, sudden trouble walking, or loss of balance. ? E - Eyes. Signs are trouble seeing or a change in how you see. ? F - Face. Signs are sudden weakness or loss of feeling in the face, or the face or eyelid drooping on one side. ? A - Arms. Signs are weakness or loss of feeling in an arm. This happens suddenly and usually on one side of the body. ? S - Speech. Signs  are sudden  trouble speaking, slurred speech, or trouble understanding what people say. ? T - Time. Time to call emergency services. Write down what time symptoms started.  You have other signs of a stroke, such as: ? A sudden, very bad headache with no known cause. ? Feeling sick to your stomach (nausea). ? Throwing up (vomiting). ? Jerky movements you cannot control (seizure). These symptoms may be an emergency. Do not wait to see if the symptoms will go away. Get medical help right away. Call your local emergency services (911 in the [place] Do not drive yourself to the hospital. Summary  Atrial fibrillation is a type of heartbeat that is irregular or fast (rapid).  You are at higher risk of this condition if you smoke, are older, have diabetes, or are overweight.  Follow your doctor's instructions about medicines, diet, exercise, and follow-up visits.  Get help right away if you think that you have signs of a stroke. This information is not intended to replace advice given to you by your health care provider. Make sure you discuss any questions you have with your health care provider. Document Released: 12/06/2007 Document Revised: 04/19/2017 Document Reviewed: 04/19/2017 Elsevier Interactive Patient Education  2019 [employer]

## 2018-07-24 ENCOUNTER — Ambulatory Visit (INDEPENDENT_AMBULATORY_CARE_PROVIDER_SITE_OTHER): Payer: Medicare HMO | Admitting: Family Medicine

## 2018-07-24 ENCOUNTER — Encounter: Payer: Self-pay | Admitting: Family Medicine

## 2018-07-24 ENCOUNTER — Telehealth: Payer: Self-pay | Admitting: *Deleted

## 2018-07-24 ENCOUNTER — Other Ambulatory Visit: Payer: Self-pay

## 2018-07-24 DIAGNOSIS — E119 Type 2 diabetes mellitus without complications: Secondary | ICD-10-CM

## 2018-07-24 DIAGNOSIS — S065XAA Traumatic subdural hemorrhage with loss of consciousness status unknown, initial encounter: Secondary | ICD-10-CM

## 2018-07-24 DIAGNOSIS — S065X9A Traumatic subdural hemorrhage with loss of consciousness of unspecified duration, initial encounter: Secondary | ICD-10-CM

## 2018-07-24 DIAGNOSIS — S2249XA Multiple fractures of ribs, unspecified side, initial encounter for closed fracture: Secondary | ICD-10-CM | POA: Diagnosis not present

## 2018-07-24 DIAGNOSIS — I10 Essential (primary) hypertension: Secondary | ICD-10-CM

## 2018-07-24 DIAGNOSIS — Z09 Encounter for follow-up examination after completed treatment for conditions other than malignant neoplasm: Secondary | ICD-10-CM

## 2018-07-24 NOTE — Telephone Encounter (Signed)
Transition Care Management Follow-up Telephone Call   Date discharged?07/22/18   How have you been since you were released from the hospital? "Doing okay"   Do you understand why you were in the hospital? yes   Do you understand the discharge instructions? yes   Where were you discharged to? home   Items Reviewed:  Medications reviewed: wife will discuss w/ PCP today  Allergies reviewed: yes  Dietary changes reviewed: yes  Referrals reviewed: yes   Functional Questionnaire:   Activities of Daily Living (ADLs):   He states they are independent in the following: ambulation, bathing and hygiene, feeding, continence, grooming, toileting and dressing. Using walker. States they require assistance with the following: na   Any transportation issues/concerns?: no   Any patient concerns? no   Confirmed importance and date/time of follow-up visits scheduled yes  Provider Appointment booked with PCP 07/24/18  Confirmed with patient if condition begins to worsen call PCP or go to the ER.  Patient was given the office number and encouraged to call back with question or concerns.  : yes

## 2018-07-24 NOTE — Progress Notes (Signed)
[facility] [address], [address] [place], [place] [address] [phone] [phone]  Date:  07/24/2018   Name:  [name] HeritageDonald G Frank   DOB:  Mar 24, 1950   MRN:  [id number]  PCP:  Pearline Cablesopland, Jessica C, MD    Chief Complaint: No chief complaint on file.   History of Present Illness:  [name] HeritageDonald G [name] Frank is a 68 y.o. very pleasant male patient who presents with the following:  Pt seen by myself once so far in November Virtual hospital follow-up today due to pandemic Patient location is home, provider location is office Patient identity confirmed with 2 identifiers, he consents to virtual visit today His wife is with him on the call today  Patient with history of hypertension, diabetes, MI/CAD status post stent in 2016, hyperlipidemia Diabetes managed by endocrinology-however at this time pt states he wants me to take over this care [facility]  He was recently hospitalized from May 4 to May 12, following an unwitnessed fall at home in which he unfortunately hit his head and sustained a subdural hematoma, scattered subarachnoid hemorrhages, and 2 rib fractures He does not remember the fall and does not know why he might have had this fall His wife found him down at home, called EMS and they came to the house- transferred him to the hospital  He was discovered to have subdural hematoma.  He was going to be discharged on 5 /8, but he went into A. fib with RVR so his admission was prolonged  His follow-up is also complicated by the fact that his wife could not accompany him to the hospital due to pandemic.  She was updated over the phone, but is not all that clear on follow-up plans   Admit date: 07/14/2018 Discharge date: 07/22/2018 Discharge Diagnoses Fall Right rib fractures SAH New onset Atrial Fibrillation  Consultants Neurosurgery Cardiology HPI: 68 year old male on Plavix presents after an unwitnessed fall at home,  possibly down a few steps. He was found by his wife. He was brought in by EMS with a laceration on the back of his scalp. Hemodynamically stable throughout. Questionable LOC. He complains of some back pain. He was found to have a scalp laceration, subdural hematoma with scattered subarachnoid hemorrhage, and two rib fractures.  Hospital Course: Patient was admitted to the trauma service and NS was consulted for his SAH/SDH.  His plavix was indefinitely held due to the size of the bleed.  He had 2 follow up CTs of his head on HD 1 and HD 3 of which both were stable with no new additional bleeding.  He was mobilized with therapies and found to need PT/OT/SLP therapies.  These were arranged.  Just prior to discharge the patient was noted to be in rapid a fib with rvr.  He was placed on a cardizem gtt and cardiology was consulted.  He was ultimately controlled on diltiazem and metoprolol.  He was to resume his other normal cardiac meds.  Anticoagulation was not started as he will not be cleared for this for like 4 weeks secondary to his SAH/SDH.  He was restarted on his ASA.  Finally his rate was controlled and the patient was felt stable for DC home on HD 8. Appropriate follows were provided.    Medication List    STOP taking these medications       clopidogrel 75 MG tablet Commonly known as:  PLAVIX  He is feeling ok at this time.  No acute complaints He has a hospital bed for home use.  I suspect he would have been admitted to rehab if not for pandemic His head does not hurt badly No vomiting He is eating ok He is getting up and walking some with a rolling walker- he is feeling unsteady but this is getting better  PT, OT and speech have been ordered for home-they are supposed to come by later today He is swallowing ok  He is on tramadol for pain- this is working ok for him  He still has some discomfort, but does not want anything stronger for pain  He has not been spirometer but  does not understand how to use it  At the end of our visit, they mentioned that they have noted enlargement of his scrotum over the last month or so- he is not really sure how long this has been present, cannot quantify how uncomfortable it is  This is not an acute issue, but I am unable to really get more detail about over the phone.  Lipitor Diltiazem 360 Gabapentin Glipizide 10 twice daily Invokamet 150/1000 twice daily Losartan 50 Robaxin as needed Toprol-XL Tramadol as needed Patient Active Problem List   Diagnosis Date Noted  . Subdural hematoma (HCC) 07/14/2018  . Hypertension, essential 01/16/2018  . Controlled type 2 diabetes mellitus without complication, without long-term current use of insulin (HCC) 01/16/2018  . Coronary artery disease involving native heart without angina pectoris 01/16/2018  . DIABETES MELLITUS, TYPE II 01/13/2011    Past Medical History:  Diagnosis Date  . Diabetes mellitus   . Heart disease   . Hyperlipemia   . Hypertension     Past Surgical History:  Procedure Laterality Date  . catarracts    . OTHER SURGICAL HISTORY  2016   HEART STENT  . TONSILLECTOMY      Social History   Tobacco Use  . Smoking status: Never Smoker  . Smokeless tobacco: Never Used  Substance Use Topics  . Alcohol use: Yes    Comment: VERY RARELY  . Drug use: No    Family History  Problem Relation Age of Onset  . Stroke Father   . Hypertension Father   . Hyperlipidemia Father   . Early death Mother   . Colitis Sister   . Appendicitis Brother     No Known Allergies  Medication list has been reviewed and updated.  Current Outpatient Medications on File Prior to Visit  Medication Sig Dispense Refill  . acetaminophen (TYLENOL) 325 MG tablet Take 2 tablets (650 mg total) by mouth every 6 (six) hours as needed for mild pain.    Marland Kitchen aspirin 81 MG chewable tablet Chew 81 mg by mouth daily.      Marland Kitchen atorvastatin (LIPITOR) 80 MG tablet Take 1 tablet (80 mg  total) by mouth daily. (Patient taking differently: Take 40 mg by mouth daily. ) 90 tablet 3  . diltiazem (CARDIZEM CD) 360 MG 24 hr capsule Take 1 capsule (360 mg total) by mouth daily. 30 capsule 0  . gabapentin (NEURONTIN) 300 MG capsule Take 1 capsule (300 mg total) by mouth 3 (three) times daily. 90 capsule 0  . glipiZIDE (GLUCOTROL XL) 10 MG 24 hr tablet Take 1 tablet (10 mg total) by mouth 2 (two) times daily. 180 tablet 3  . guaiFENesin (MUCINEX) 600 MG 12 hr tablet Take 1 tablet (600 mg total) by mouth 2 (two) times daily.    Marland Kitchen  INVOKAMET [phone] MG TABS Take 1 tablet by mouth 2 (two) times daily. 180 tablet 3  . losartan (COZAAR) 50 MG tablet Take 1 tablet (50 mg total) by mouth daily. 90 tablet 3  . methocarbamol (ROBAXIN) 750 MG tablet Take 1 tablet (750 mg total) by mouth 3 (three) times daily. 60 tablet 0  . metoprolol succinate (TOPROL-XL) 100 MG 24 hr tablet Take 1 tablet (100 mg total) by mouth daily. Take with or immediately following a meal. 30 tablet 0  . traMADol (ULTRAM) 50 MG tablet Take 1-2 tablets (50-100 mg total) by mouth every 6 (six) hours as needed for moderate pain or severe pain. 30 tablet 0   No current facility-administered medications on file prior to visit.     Review of Systems:  As per HPI- otherwise negative.   Physical Examination: There were no vitals filed for this visit. There were no vitals filed for this visit. There is no height or weight on file to calculate BMI. Ideal Body Weight:    He is laying down in bed at home -he looks well, no cough or distress They are not checking BP at home  No fever   Assessment and Plan: Hospital discharge follow-up  Hypertension, essential  Controlled type 2 diabetes mellitus without complication, without long-term current use of insulin (HCC)  Subdural hematoma (HCC)  Closed fracture of multiple ribs, unspecified laterality, initial encounter  Virtual hospital follow-up visit today.  Riordan was  hospitalized for 8 days as above, discharged 2 days ago.  Home health is coming out later today.  I asked them to have home health demonstrate the incentive spirometer and please get a set of vitals They inquire why Dailey had this fall and loss of consciousness.  I explained that this is not clear.  I wonder if perhaps due to arrhythmia, but we may never know.  Asked his wife to call and set up appointments with neurosurgery (Dr. Dutch Quint) and cardiology (Dr. Tyson Babinski with [facility]) as per discharge summary His in-hospital A1c was above goal, but will not make any acute changes to his medications just now  Encouraged him to move around as approved by physical therapy, using spinal spirometer to help prevent pneumonia.  Set an in office visit for next week, so I can evaluate this issue with his scrotum. At that time we can also do labs, repeat TSH which was abnormal while inpatient  I spoke with patient on the phone for about 35 minutes today.  This is in addition to chart review and coordination of care  Lab Results  Component Value Date   HGBA1C 8.6 (H) 07/14/2018   Lab Results  Component Value Date   TSH 5.115 (H) 07/18/2018     Signed Abbe Amsterdam, MD

## 2018-07-25 ENCOUNTER — Telehealth: Payer: Self-pay | Admitting: Family Medicine

## 2018-07-25 NOTE — Telephone Encounter (Signed)
Copied from CRM [id number]. Topic: Quick Communication - Home Health Verbal Orders >> Jul 25, 2018  3:57 PM Jay Schlichter wrote: Caller/Agency: [employer]  Callback Number: [phone] Requesting OT/PT/Skilled Nursing/Social Work/Speech Therapy: speech  Frequency: 2 week 6

## 2018-07-28 ENCOUNTER — Other Ambulatory Visit: Payer: Self-pay

## 2018-07-28 ENCOUNTER — Inpatient Hospital Stay (HOSPITAL_COMMUNITY)
Admission: EM | Admit: 2018-07-28 | Discharge: 2018-08-01 | DRG: 056 | Disposition: A | Discharge status: Rehabilitation Facility | Payer: Medicare HMO | Attending: Internal Medicine | Admitting: Internal Medicine

## 2018-07-28 ENCOUNTER — Emergency Department (HOSPITAL_COMMUNITY): Payer: Medicare HMO

## 2018-07-28 ENCOUNTER — Telehealth: Payer: Self-pay | Admitting: Family Medicine

## 2018-07-28 ENCOUNTER — Telehealth: Payer: Self-pay

## 2018-07-28 ENCOUNTER — Encounter (HOSPITAL_COMMUNITY): Payer: Self-pay | Admitting: Emergency Medicine

## 2018-07-28 ENCOUNTER — Other Ambulatory Visit (HOSPITAL_COMMUNITY): Payer: Self-pay

## 2018-07-28 DIAGNOSIS — E119 Type 2 diabetes mellitus without complications: Secondary | ICD-10-CM | POA: Diagnosis not present

## 2018-07-28 DIAGNOSIS — I2510 Atherosclerotic heart disease of native coronary artery without angina pectoris: Secondary | ICD-10-CM | POA: Diagnosis not present

## 2018-07-28 DIAGNOSIS — Z955 Presence of coronary angioplasty implant and graft: Secondary | ICD-10-CM

## 2018-07-28 DIAGNOSIS — N289 Disorder of kidney and ureter, unspecified: Secondary | ICD-10-CM

## 2018-07-28 DIAGNOSIS — G92 Toxic encephalopathy: Secondary | ICD-10-CM

## 2018-07-28 DIAGNOSIS — S065X9A Traumatic subdural hemorrhage with loss of consciousness of unspecified duration, initial encounter: Secondary | ICD-10-CM | POA: Diagnosis not present

## 2018-07-28 DIAGNOSIS — I10 Essential (primary) hypertension: Secondary | ICD-10-CM | POA: Diagnosis present

## 2018-07-28 DIAGNOSIS — R29810 Facial weakness: Secondary | ICD-10-CM | POA: Diagnosis present

## 2018-07-28 DIAGNOSIS — W19XXXS Unspecified fall, sequela: Secondary | ICD-10-CM | POA: Diagnosis present

## 2018-07-28 DIAGNOSIS — G910 Communicating hydrocephalus: Principal | ICD-10-CM | POA: Diagnosis present

## 2018-07-28 DIAGNOSIS — Z823 Family history of stroke: Secondary | ICD-10-CM

## 2018-07-28 DIAGNOSIS — R29706 NIHSS score 6: Secondary | ICD-10-CM | POA: Diagnosis present

## 2018-07-28 DIAGNOSIS — N433 Hydrocele, unspecified: Secondary | ICD-10-CM | POA: Diagnosis present

## 2018-07-28 DIAGNOSIS — R471 Dysarthria and anarthria: Secondary | ICD-10-CM | POA: Diagnosis present

## 2018-07-28 DIAGNOSIS — R569 Unspecified convulsions: Secondary | ICD-10-CM | POA: Diagnosis present

## 2018-07-28 DIAGNOSIS — R4701 Aphasia: Secondary | ICD-10-CM | POA: Diagnosis not present

## 2018-07-28 DIAGNOSIS — S065X0S Traumatic subdural hemorrhage without loss of consciousness, sequela: Secondary | ICD-10-CM

## 2018-07-28 DIAGNOSIS — Z7984 Long term (current) use of oral hypoglycemic drugs: Secondary | ICD-10-CM

## 2018-07-28 DIAGNOSIS — Z79899 Other long term (current) drug therapy: Secondary | ICD-10-CM

## 2018-07-28 DIAGNOSIS — N5089 Other specified disorders of the male genital organs: Secondary | ICD-10-CM | POA: Diagnosis present

## 2018-07-28 DIAGNOSIS — Z7901 Long term (current) use of anticoagulants: Secondary | ICD-10-CM

## 2018-07-28 DIAGNOSIS — R400 Somnolence: Secondary | ICD-10-CM

## 2018-07-28 DIAGNOSIS — G919 Hydrocephalus, unspecified: Secondary | ICD-10-CM

## 2018-07-28 DIAGNOSIS — G9341 Metabolic encephalopathy: Secondary | ICD-10-CM | POA: Diagnosis present

## 2018-07-28 DIAGNOSIS — Z8249 Family history of ischemic heart disease and other diseases of the circulatory system: Secondary | ICD-10-CM

## 2018-07-28 DIAGNOSIS — I480 Paroxysmal atrial fibrillation: Secondary | ICD-10-CM | POA: Diagnosis present

## 2018-07-28 DIAGNOSIS — E785 Hyperlipidemia, unspecified: Secondary | ICD-10-CM | POA: Diagnosis present

## 2018-07-28 DIAGNOSIS — Z7902 Long term (current) use of antithrombotics/antiplatelets: Secondary | ICD-10-CM

## 2018-07-28 DIAGNOSIS — Z1159 Encounter for screening for other viral diseases: Secondary | ICD-10-CM

## 2018-07-28 DIAGNOSIS — Z7982 Long term (current) use of aspirin: Secondary | ICD-10-CM

## 2018-07-28 DIAGNOSIS — I959 Hypotension, unspecified: Secondary | ICD-10-CM | POA: Diagnosis present

## 2018-07-28 LAB — PROTIME-INR
INR: 1.2 (ref 0.8–1.2)
Prothrombin Time: 15 seconds (ref 11.4–15.2)

## 2018-07-28 LAB — COMPREHENSIVE METABOLIC PANEL
ALT: 16 U/L (ref 0–44)
AST: 16 U/L (ref 15–41)
Albumin: 3.5 g/dL (ref 3.5–5.0)
Alkaline Phosphatase: 103 U/L (ref 38–126)
Anion gap: 20 — ABNORMAL HIGH (ref 5–15)
BUN: 21 mg/dL (ref 8–23)
CO2: 13 mmol/L — ABNORMAL LOW (ref 22–32)
Calcium: 9.1 mg/dL (ref 8.9–10.3)
Chloride: 100 mmol/L (ref 98–111)
Creatinine, Ser: 1.25 mg/dL — ABNORMAL HIGH (ref 0.61–1.24)
GFR calc Af Amer: >60 mL/min (ref >60)
GFR calc non Af Amer: >60 mL/min (ref >60)
Glucose, Bld: 184 mg/dL — ABNORMAL HIGH (ref 70–99)
Potassium: 4.6 mmol/L (ref 3.5–5.1)
Sodium: 133 mmol/L — ABNORMAL LOW (ref 135–145)
Total Bilirubin: 1.8 mg/dL — ABNORMAL HIGH (ref 0.3–1.2)
Total Protein: 6.9 g/dL (ref 6.5–8.1)

## 2018-07-28 LAB — DIFFERENTIAL
Abs Immature Granulocytes: 0.12 10*3/uL — ABNORMAL HIGH (ref 0.00–0.07)
Basophils Absolute: 0.1 10*3/uL (ref 0.0–0.1)
Basophils Relative: 1 %
Eosinophils Absolute: 0.1 10*3/uL (ref 0.0–0.5)
Eosinophils Relative: 1 %
Immature Granulocytes: 1 %
Lymphocytes Relative: 8 %
Lymphs Abs: 1 10*3/uL (ref 0.7–4.0)
Monocytes Absolute: 1.1 10*3/uL — ABNORMAL HIGH (ref 0.1–1.0)
Monocytes Relative: 9 %
Neutro Abs: 10.6 10*3/uL — ABNORMAL HIGH (ref 1.7–7.7)
Neutrophils Relative %: 80 %

## 2018-07-28 LAB — POCT I-STAT 7, (LYTES, BLD GAS, ICA,H+H)
Acid-base deficit: 10 mmol/L — ABNORMAL HIGH (ref 0.0–2.0)
Bicarbonate: 13.4 mmol/L — ABNORMAL LOW (ref 20.0–28.0)
Calcium, Ion: 1.24 mmol/L (ref 1.15–1.40)
HCT: 36 % — ABNORMAL LOW (ref 39.0–52.0)
Hemoglobin: 12.2 g/dL — ABNORMAL LOW (ref 13.0–17.0)
O2 Saturation: 97 %
Potassium: 4.6 mmol/L (ref 3.5–5.1)
Sodium: 132 mmol/L — ABNORMAL LOW (ref 135–145)
TCO2: 14 mmol/L — ABNORMAL LOW (ref 22–32)
pCO2 arterial: 23.7 mmHg — ABNORMAL LOW (ref 32.0–48.0)
pH, Arterial: 7.36 (ref 7.350–7.450)
pO2, Arterial: 88 mmHg (ref 83.0–108.0)

## 2018-07-28 LAB — I-STAT CHEM 8, ED
BUN: 20 mg/dL (ref 8–23)
Calcium, Ion: 1.1 mmol/L — ABNORMAL LOW (ref 1.15–1.40)
Chloride: 103 mmol/L (ref 98–111)
Creatinine, Ser: 0.7 mg/dL (ref 0.61–1.24)
Glucose, Bld: 181 mg/dL — ABNORMAL HIGH (ref 70–99)
HCT: 41 % (ref 39.0–52.0)
Hemoglobin: 13.9 g/dL (ref 13.0–17.0)
Potassium: 4.5 mmol/L (ref 3.5–5.1)
Sodium: 131 mmol/L — ABNORMAL LOW (ref 135–145)
TCO2: 14 mmol/L — ABNORMAL LOW (ref 22–32)

## 2018-07-28 LAB — GLUCOSE, CAPILLARY
Glucose-Capillary: 93 mg/dL (ref 70–99)
Glucose-Capillary: 95 mg/dL (ref 70–99)

## 2018-07-28 LAB — CBC
HCT: 42.5 % (ref 39.0–52.0)
Hemoglobin: 13.7 g/dL (ref 13.0–17.0)
MCH: 29.3 pg (ref 26.0–34.0)
MCHC: 32.2 g/dL (ref 30.0–36.0)
MCV: 91 fL (ref 80.0–100.0)
Platelets: 313 10*3/uL (ref 150–400)
RBC: 4.67 MIL/uL (ref 4.22–5.81)
RDW: 14.4 % (ref 11.5–15.5)
WBC: 13 10*3/uL — ABNORMAL HIGH (ref 4.0–10.5)
nRBC: 0 % (ref 0.0–0.2)

## 2018-07-28 LAB — APTT: aPTT: 29 seconds (ref 24–36)

## 2018-07-28 LAB — SARS CORONAVIRUS 2 BY RT PCR (HOSPITAL ORDER, PERFORMED IN ~~LOC~~ HOSPITAL LAB): SARS Coronavirus 2: NEGATIVE

## 2018-07-28 LAB — CBG MONITORING, ED: Glucose-Capillary: 163 mg/dL — ABNORMAL HIGH (ref 70–99)

## 2018-07-28 IMAGING — CT CT HEAD CODE STROKE W/O CM
3 series · 15 of 47 positions shown, 18 images · non-contrast
Comparison: Multiple priors, most recent [DATE]

CLINICAL DATA: Code stroke. LEFT-sided facial droop. Progressive
somnolence. Last seen normal [TT] hours.

EXAM:
CT HEAD WITHOUT CONTRAST
TECHNIQUE: Contiguous axial images were obtained from the base of the skull
through the vertex without intravenous contrast.

[Series 3: head 5.0 st · axial · 0.47mm/px · z∈[-102,+58]mm · 9 of 38 slices shown, 12 images]
[im 3/38  brain]
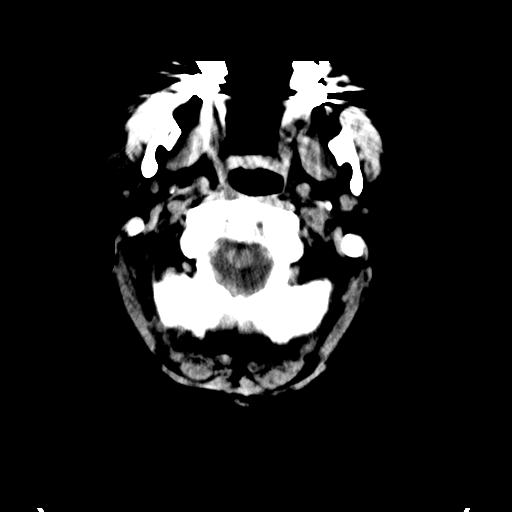
[im 3/38  bone]
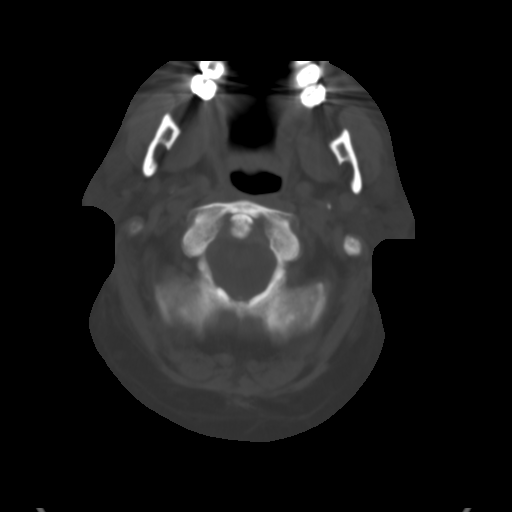
[im 7/38  brain]
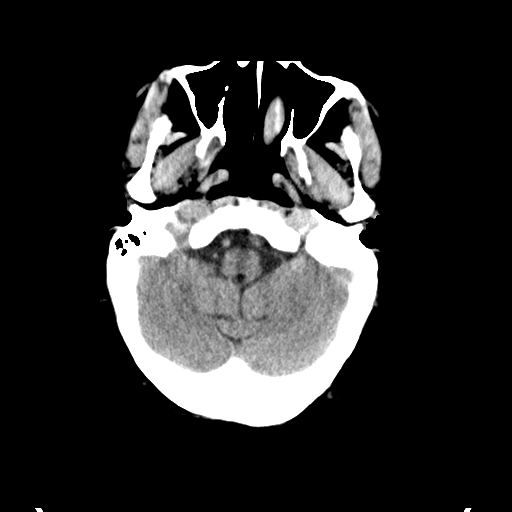
[im 11/38  brain]
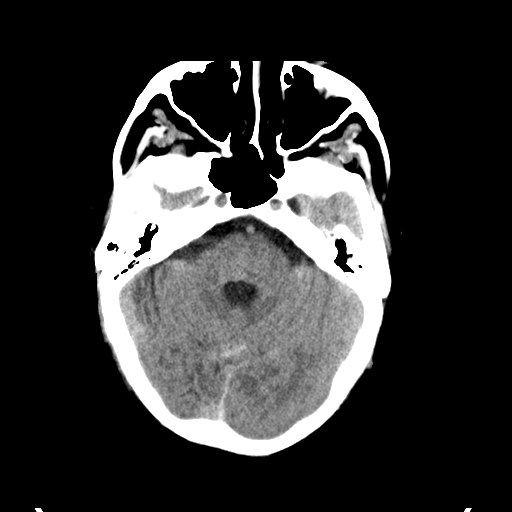
[im 15/38  brain]
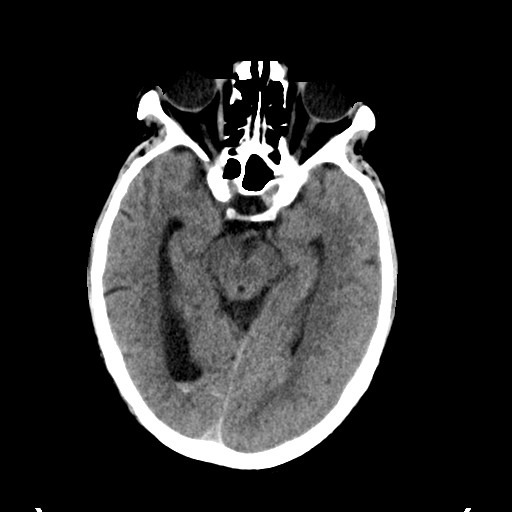
[im 20/38  brain]
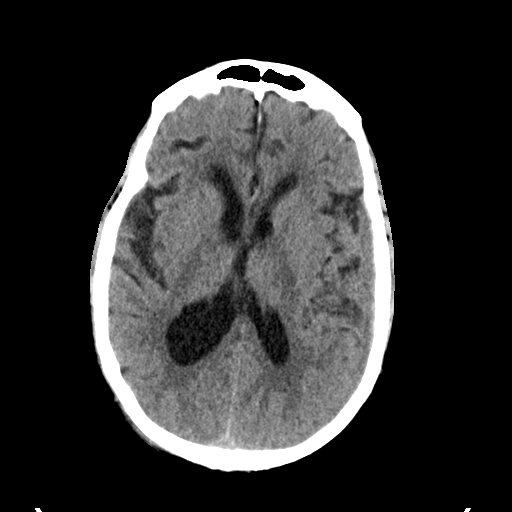
[im 20/38  bone]
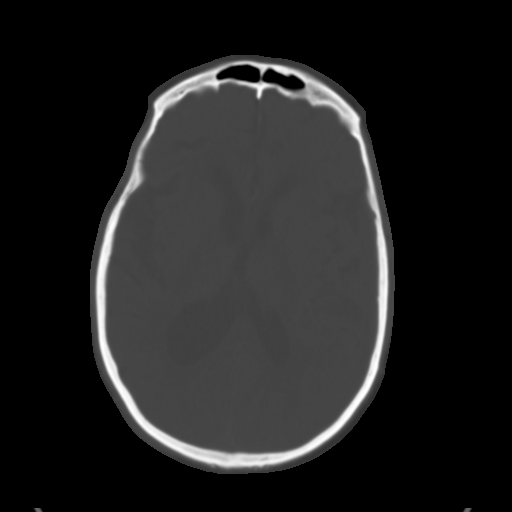
[im 23/38  brain]
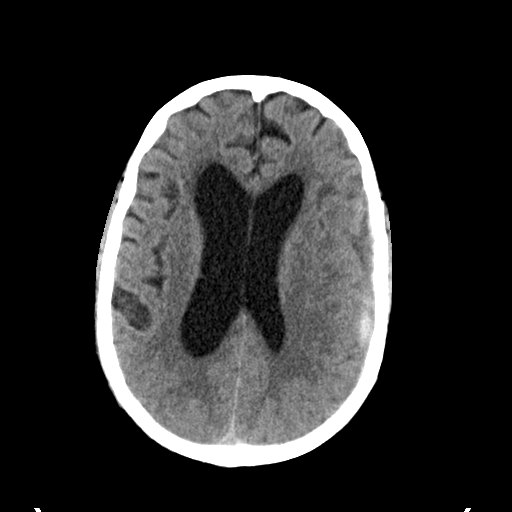
[im 27/38  brain]
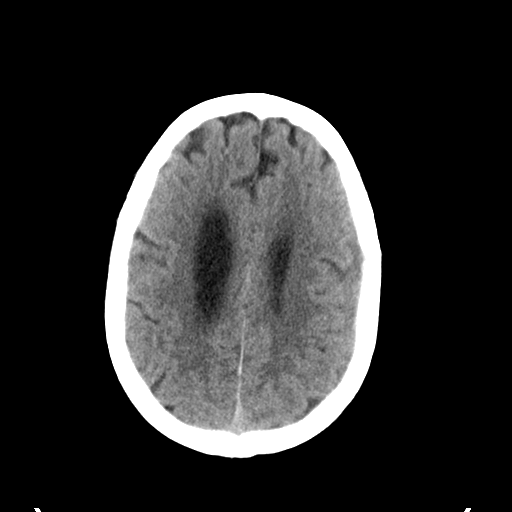
[im 31/38  brain]
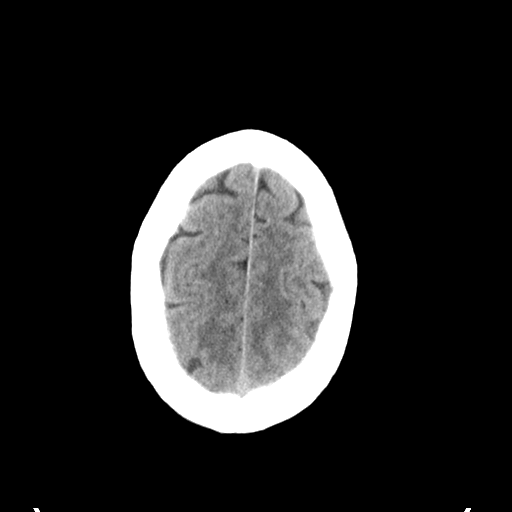
[im 35/38  brain]
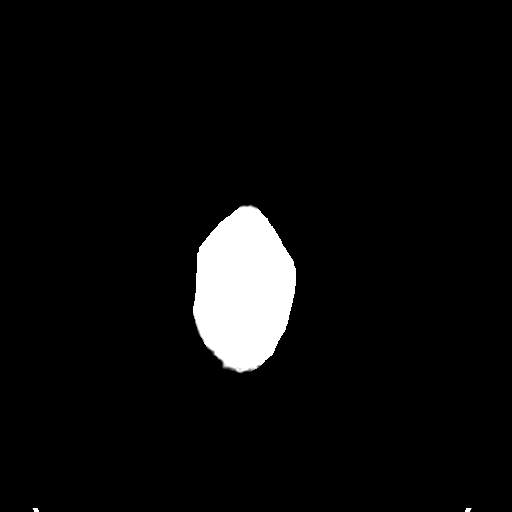
[im 35/38  bone]
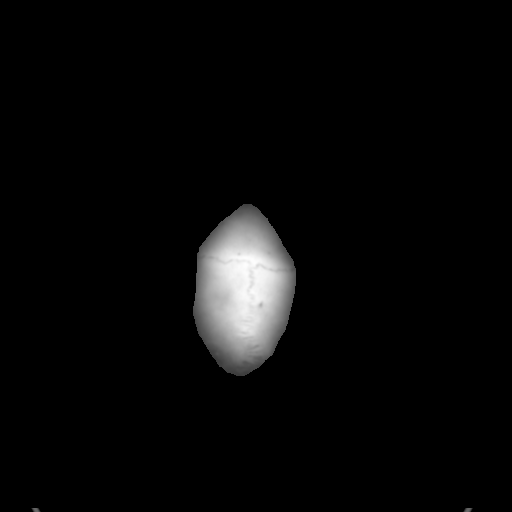

[Series 5: head 3.0 cor st · coronal · 0.34mm/px · 3 of 73 slices shown]
[im 25/73  brain]
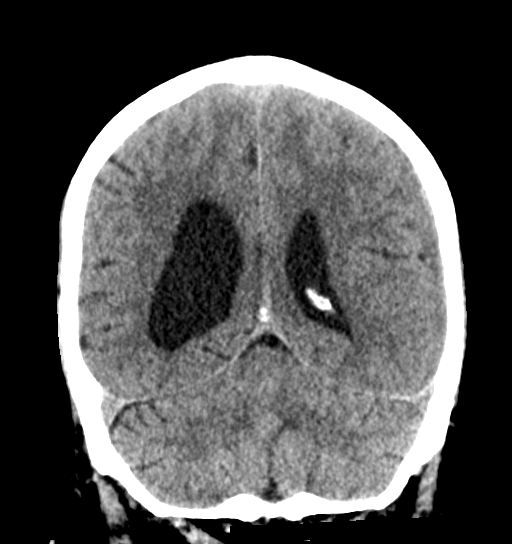
[im 33/73  brain]
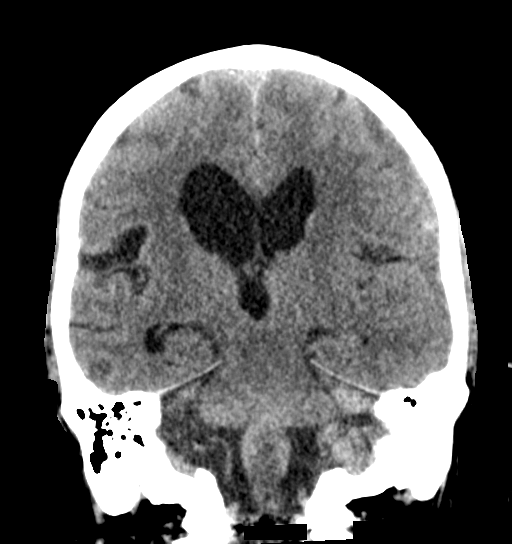
[im 41/73  brain]
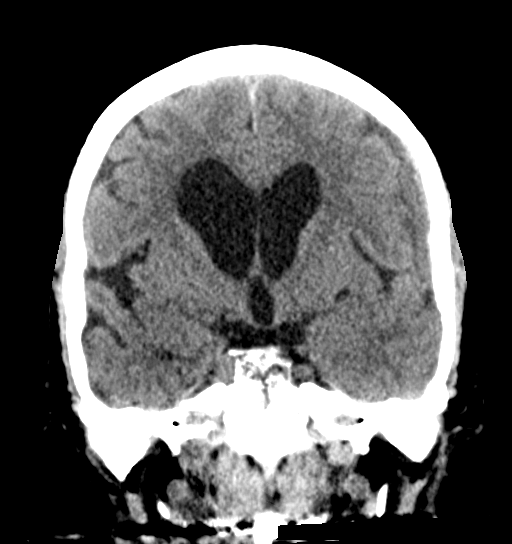

[Series 6: head 3.0 sag st · sagittal · 0.36mm/px · 3 of 58 slices shown]
[im 20/58  brain]
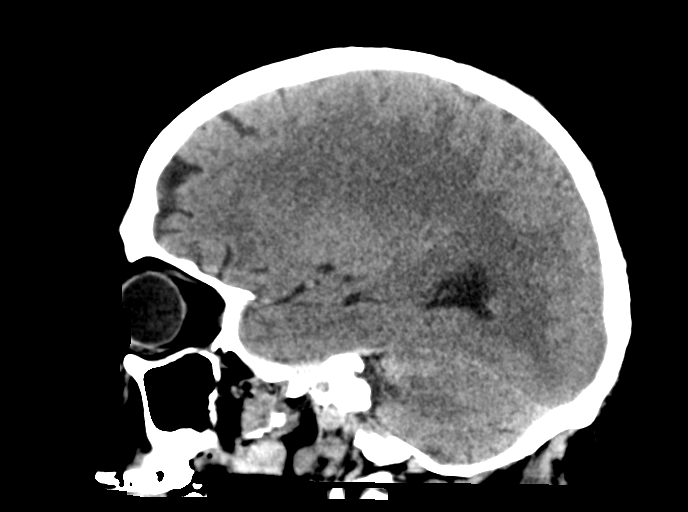
[im 29/58  brain]
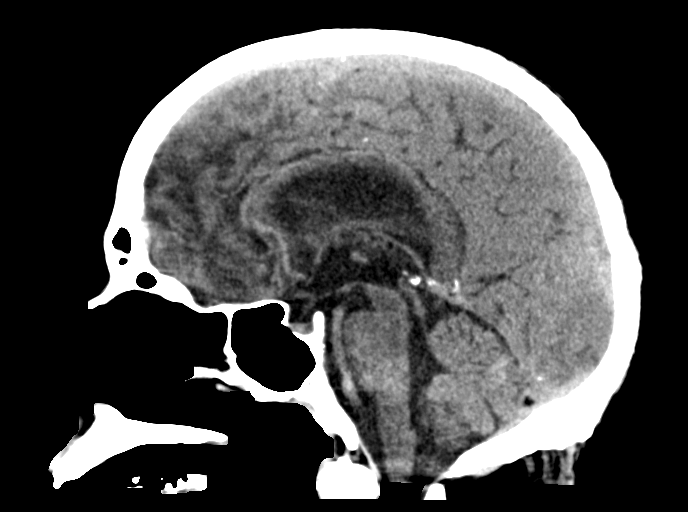
[im 39/58  brain]
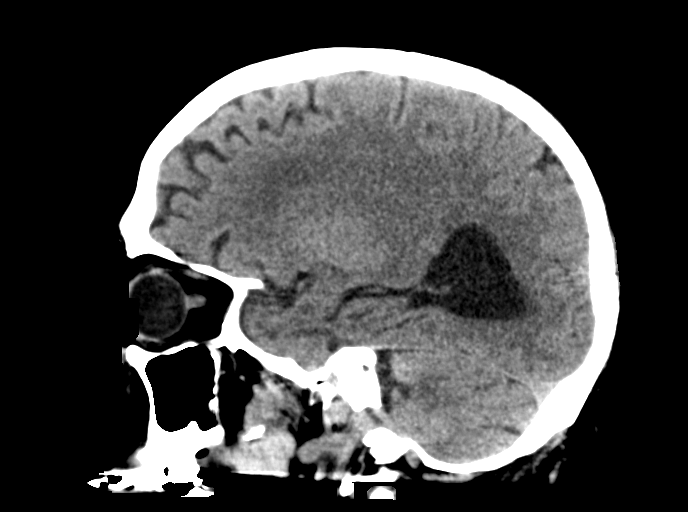

[15 of 47 positions shown; findings below may reference images not displayed]

FINDINGS: Brain: No acute stroke or parenchymal hemorrhage. No intracranial
intra-axial mass lesion. Developing hydrocephalus, biventricular
diameter now 52 mm, as compared with 46 mm on [DATE].

LEFT frontoparietal subdural collection is also increased, now 8 mm
as compared with 4-5 mm previous. The attenuation of the subdural
collection is now becoming more isodense, with some slight
hyperdense residual collection.

Intraventricular blood is improving but still layering. Subarachnoid
blood is resolving.

Vascular: No hyperdense vessel or unexpected calcification.

Skull: Normal. Negative for fracture or focal lesion.

Sinuses/Orbits: Negative

Other: None

ASPECTS (Alberta Stroke Program Early CT Score)

- Ganglionic level infarction (caudate, lentiform nuclei, internal
capsule, insula, M1-M3 cortex): 7

- Supraganglionic infarction (M4-M6 cortex): 3

Total score (0-10 with 10 being normal): 10
IMPRESSION: 1. Developing communicating hydrocephalus due to previous
subarachnoid and intraventricular hemorrhage. Biventricular diameter
is 52 mm, which is significant. Resolving subarachnoid blood.
2. Increasing size of LEFT frontoparietal subdural collection, now 8
mm.
3. ASPECTS is 10.
* These results were called by telephone at the time of
interpretation on [DATE] at [DATE] to Dr. LIDUVINA , who verbally
acknowledged these results.

## 2018-07-28 IMAGING — CT CT ANGIOGRAPHY NECK
2 of 7 series · 8 of 33 positions shown · IV contrast (OMNI 350)
Comparison: [DATE] CT of the head.

CLINICAL DATA: 67 y/o M; left-sided facial droop. Progressive
somnolence.

EXAM:
CT ANGIOGRAPHY HEAD AND NECK
TECHNIQUE: Multidetector CT imaging of the head and neck was performed using
the standard protocol during bolus administration of intravenous
contrast. Multiplanar CT image reconstructions and MIPs were
obtained to evaluate the vascular anatomy. Carotid stenosis
measurements (when applicable) are obtained utilizing NASCET
criteria, using the distal internal carotid diameter as the
denominator.
CONTRAST:  75mL OMNIPAQUE IOHEXOL 350 MG/ML SOLN

[Series 7: cta neck · axial · 0.46mm/px · z∈[-200,-76]mm · 2 of 188 slices shown]
[im 63/188  soft-tissue]
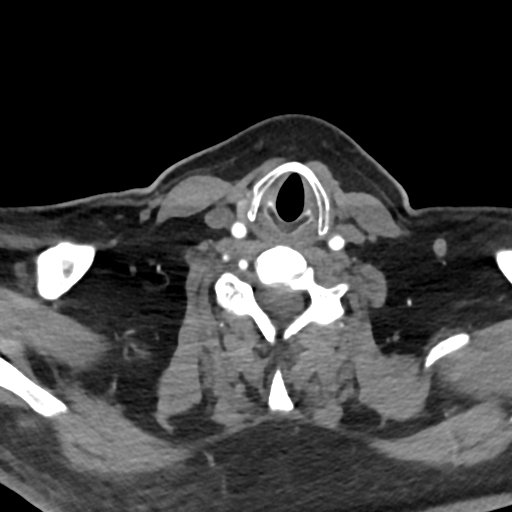
[im 125/188  soft-tissue]
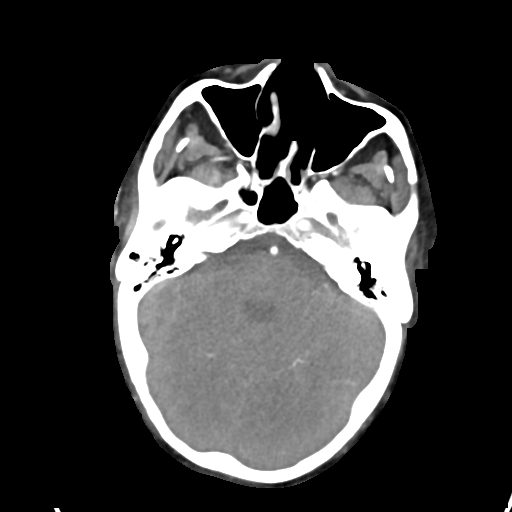

[Series 9: cta neck axial · axial · 0.39mm/px · z∈[-274,-0]mm · 6 of 384 slices shown]
[im 55/384  soft-tissue]
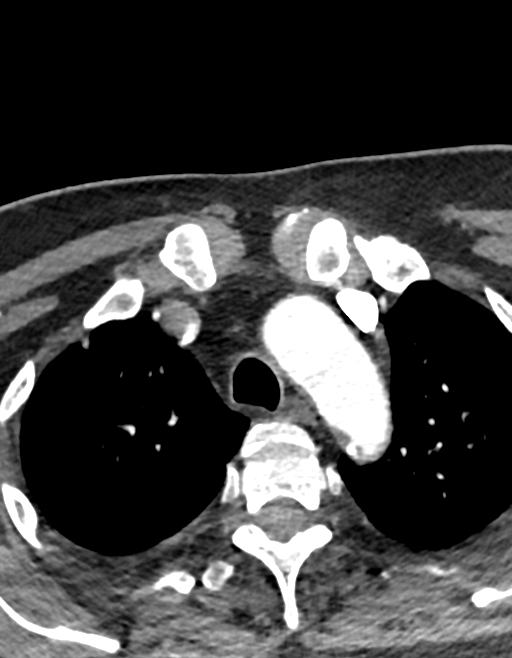
[im 110/384  bone]
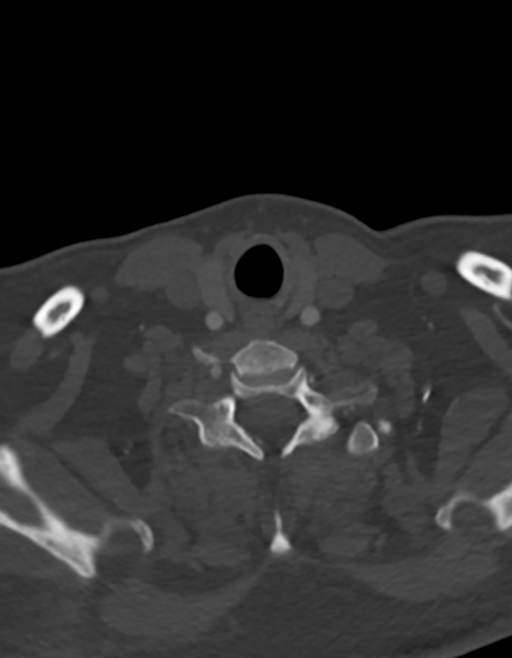
[im 165/384  soft-tissue]
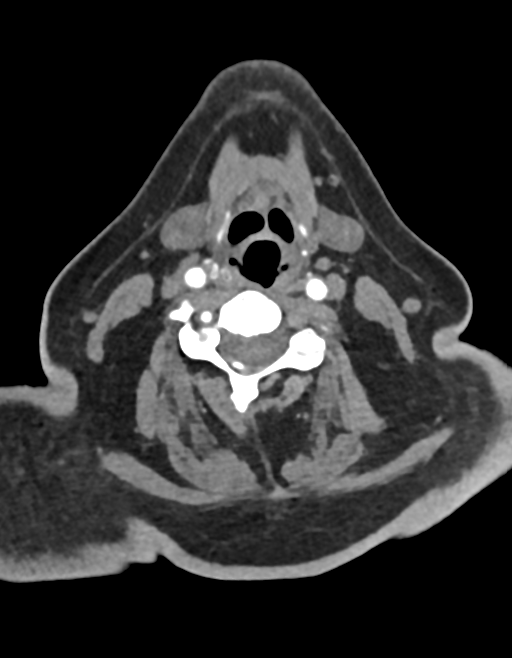
[im 219/384  bone]
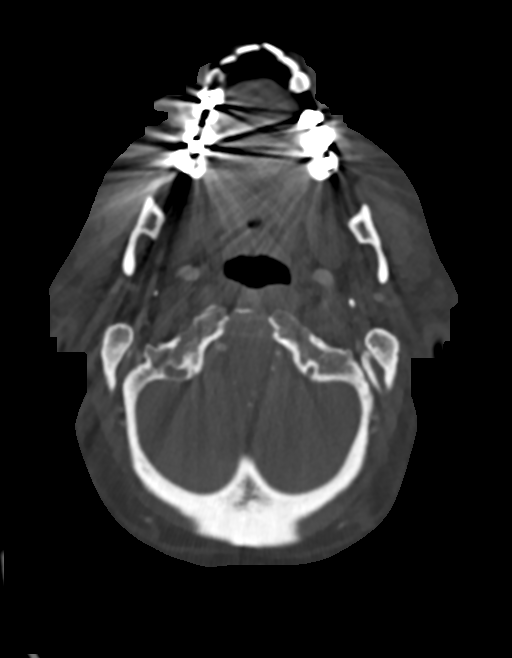
[im 274/384  soft-tissue]
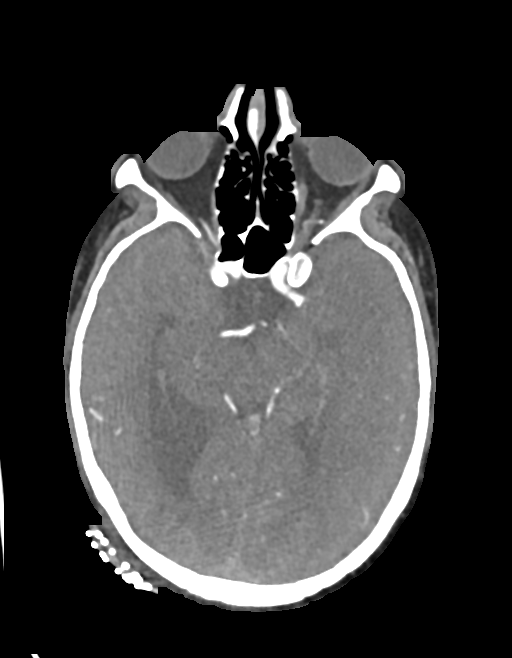
[im 329/384  bone]
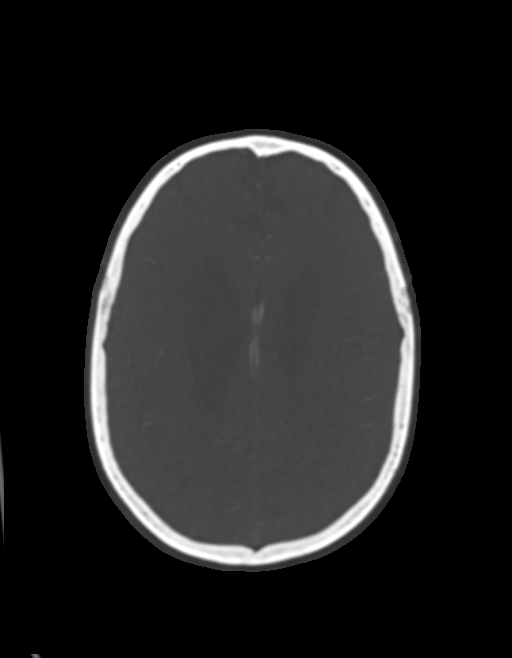

[8 of 33 positions shown; findings below may reference images not displayed]

FINDINGS: CTA NECK FINDINGS

Aortic arch: Standard branching. Imaged portion shows no evidence of
aneurysm or dissection. No significant stenosis of the major arch
vessel origins. Mild aortic calcific atherosclerosis.

Right carotid system: No evidence of dissection, stenosis (50% or
greater) or occlusion. Mild non stenotic calcific atherosclerosis of
carotid bifurcation.

Left carotid system: No evidence of dissection, stenosis (50% or
greater) or occlusion. Mild non stenotic calcific atherosclerosis of
carotid bifurcation.

Vertebral arteries: Left V1-V2 proximal V3 are occluded. The left
distal V3 and V4 are diffusely irregular with attenuated enhancement
and reversal of flow from the vertebrobasilar junction. There
appears to be a cauda above the left C2 foramen transversarium
(series 7, image 86). Widely patent and dominant right vertebral
artery.

Skeleton: No acute finding.

Other neck: Negative.

Upper chest: Right lung apex calcified granuloma.

Review of the MIP images confirms the above findings

CTA HEAD FINDINGS

Anterior circulation: No significant stenosis, proximal occlusion,
aneurysm, or vascular malformation.

Posterior circulation: Left V1-V2 and proximal V3 are occluded, no
visible dissection flap or wall hematoma. The left distal V3 and V4
are diffusely irregular with attenuated enhancement and reversal of
flow from the vertebrobasilar junction. Widely patent and dominant
right vertebral artery. There appears to be a cauda above the left
C2 foramen transversarium (series 7, image 86).

Tandem segments of mild-to-moderate stenosis in the left P2 and P3
segments. Large PICA and SCA noted bilaterally. Otherwise no
significant stenosis, proximal occlusion, aneurysm, or vascular
malformation in the posterior circulation.

Venous sinuses: As permitted by contrast timing, patent.

Anatomic variants: Fetal left PCA. No right posterior communicating
artery identified, likely hypoplastic or absent. Probable diminutive
anterior communicating artery.

Review of the MIP images confirms the above findings
IMPRESSION: 1. Nonspecific left V1-V2 and proximal V3 occlusion. There appears
to be a cutoff above the left C2 foramen transversarium suggesting a
recent thromboembolic occlusion or dissection.
2. Left distal V3 and V4 are diffusely irregular with attenuated
enhancement and reversal of flow from vertebrobasilar junction.
3. Patent carotid systems and dominant right vertebral artery
without dissection, aneurysm, or hemodynamically significant
stenosis utilizing NASCET criteria.
4. Mild calcific atherosclerosis of aortic arch and carotid
bifurcations.

CTA HEAD:.

1. Patent Circle of Willis. No intracranial large vessel occlusion,
aneurysm, or vascular malformation identified.
2. Tandem segments of mild-to-moderate stenosis in the left P2 and
P3 segments.
3. Left V4 is diffusely irregular and attenuated with reversal of
flow from the vertebrobasilar junction.
4. Fetal left PCA.

These results were called by telephone at the time of interpretation
on [DATE] at [DATE] to Dr. ZEINAB , who verbally
acknowledged these results.

## 2018-07-28 MED ORDER — FENTANYL CITRATE (PF) 100 MCG/2ML IJ SOLN: 12.5000 ug | INTRAMUSCULAR | Status: DC | PRN

## 2018-07-28 MED ORDER — POLYETHYLENE GLYCOL 3350 17 G PO PACK: 17.0000 g | PACK | Freq: Every day | ORAL | Status: DC | PRN

## 2018-07-28 MED ORDER — INSULIN ASPART 100 UNIT/ML ~~LOC~~ SOLN
0.0000 [IU] | SUBCUTANEOUS | Status: DC
  Administered 2018-07-29 – 2018-07-31 (×4): 2 [IU] via SUBCUTANEOUS
  Administered 2018-07-31 – 2018-08-01 (×3): 1 [IU] via SUBCUTANEOUS
  Administered 2018-08-01: 05:00:00 2 [IU] via SUBCUTANEOUS
  Administered 2018-08-01: 13:00:00 1 [IU] via SUBCUTANEOUS

## 2018-07-28 MED ORDER — SODIUM CHLORIDE 0.9% FLUSH
3.0000 mL | Freq: Once | INTRAVENOUS | Status: DC
Start: 2018-07-28 — End: 2018-08-01

## 2018-07-28 MED ORDER — DILTIAZEM HCL ER COATED BEADS 360 MG PO CP24
360.0000 mg | ORAL_CAPSULE | Freq: Every day | ORAL | Status: DC
  Administered 2018-07-29 – 2018-07-30 (×2): 360 mg via ORAL
  Filled 2018-07-28: qty 2
  Filled 2018-07-28: qty 1
  Filled 2018-07-28: qty 2
  Filled 2018-07-28: qty 1

## 2018-07-28 MED ORDER — ACETAMINOPHEN 650 MG RE SUPP: 650.0000 mg | Freq: Four times a day (QID) | RECTAL | Status: DC | PRN

## 2018-07-28 MED ORDER — ATORVASTATIN CALCIUM 80 MG PO TABS
80.0000 mg | ORAL_TABLET | Freq: Every day | ORAL | Status: DC
  Administered 2018-07-29 – 2018-08-01 (×4): 80 mg via ORAL
  Filled 2018-07-28 (×4): qty 1

## 2018-07-28 MED ORDER — ONDANSETRON HCL 4 MG/2ML IJ SOLN: 4.0000 mg | Freq: Four times a day (QID) | INTRAMUSCULAR | Status: DC | PRN

## 2018-07-28 MED ORDER — ONDANSETRON HCL 4 MG PO TABS: 4.0000 mg | ORAL_TABLET | Freq: Four times a day (QID) | ORAL | Status: DC | PRN

## 2018-07-28 MED ORDER — ACETAMINOPHEN 325 MG PO TABS
650.0000 mg | ORAL_TABLET | Freq: Four times a day (QID) | ORAL | Status: DC | PRN
  Administered 2018-07-30 – 2018-08-01 (×4): 650 mg via ORAL
  Filled 2018-07-28 (×4): qty 2

## 2018-07-28 MED ORDER — METOPROLOL SUCCINATE ER 100 MG PO TB24
100.0000 mg | ORAL_TABLET | Freq: Every day | ORAL | Status: DC
  Administered 2018-07-29 – 2018-07-31 (×3): 100 mg via ORAL
  Filled 2018-07-28 (×4): qty 1

## 2018-07-28 MED ORDER — SODIUM CHLORIDE 0.9% FLUSH
3.0000 mL | Freq: Two times a day (BID) | INTRAVENOUS | Status: DC
  Administered 2018-07-28 – 2018-07-31 (×6): 3 mL via INTRAVENOUS

## 2018-07-28 MED ORDER — HYDRALAZINE HCL 20 MG/ML IJ SOLN: 5.0000 mg | INTRAMUSCULAR | Status: DC | PRN

## 2018-07-28 MED ORDER — IOHEXOL 350 MG/ML SOLN
75.0000 mL | Freq: Once | INTRAVENOUS | Status: AC | PRN
  Administered 2018-07-28: 19:00:00 75 mL via INTRAVENOUS

## 2018-07-28 MED ORDER — SODIUM CHLORIDE 0.9 % IV SOLN
INTRAVENOUS | Status: AC
  Administered 2018-07-28: 23:00:00 via INTRAVENOUS

## 2018-07-28 NOTE — ED Triage Notes (Signed)
Patient arrived from home via [facility] with reports of right face droop,  not following commands, difficulty ambulating. Patient's wife reports that PT has had increased sleepiness in the last 3 days and has increasingly had difficulty with following commands and ambulation.  Wife at bedside

## 2018-07-28 NOTE — Consult Note (Signed)
Neurosurgery Consultation  Reason for Consult: aphasia Referring Physician: opyd  CC: aphasia  HPI: This is a 68 y.o. man s/p head trauma with SDH roughly 2wks ago that presents with 3d of intermittent confusion and word finding difficulty. Tonight he had more prolonged difficulty speaking and his wife noticed a right sided facial droop so she brought him to the ED. At the ED, the symptoms reportedly resolved spontaneously, but recurred either while in transport or upon arrival here. He denies any new weakness, numbness, or parasthesias, no auras, no involuntary movements or abnormal sensations. He was previously on clopidogrel but has been off of it due to the SDH.   ROS: A 14 point ROS was performed and is negative except as noted in the HPI.   PMHx:  Past Medical History:  Diagnosis Date  . Diabetes mellitus   . Heart disease   . Hyperlipemia   . Hypertension    FamHx:  Family History  Problem Relation Age of Onset  . Stroke Father   . Hypertension Father   . Hyperlipidemia Father   . Early death Mother   . Colitis Sister   . Appendicitis Brother    SocHx:  reports that he has never smoked. He has never used smokeless tobacco. He reports current alcohol use. He reports that he does not use drugs.  Exam: Vital signs in last 24 hours: Temp:  [97.9 F (36.6 C)] 97.9 F (36.6 C) (05/18 1906) Pulse Rate:  [55-57] 56 (05/18 1930) Resp:  [20-25] 25 (05/18 1930) BP: (90-102)/(53-56) 102/54 (05/18 1930) SpO2:  [95 %-100 %] 100 % (05/18 1930) Weight:  [95.6 kg] 95.6 kg (05/18 1906) General: Awake, alert, cooperative, lying in bed in NAD Head: normocephalic and atruamatic HEENT: neck supple, head lac w/ staples in place, healing well Pulmonary: breathing room air comfortably, no evidence of increased work of breathing Cardiac: RRR Abdomen: S NT ND Extremities: warm and well perfused x4 Neuro: Awake/alert, minimal verbal output but recognition intact and able to follow complex  commands, speech with significant dysnomia, decreased prosody, intact repetition for simple phrases, Ox3 with choices, PERRL, EOMI, +mild R UMN FD, strength 5/5x4, SILTx4, no drift   Assessment and Plan: 68 y.o. man s/p left fronto-parietal subdural hematoma, now with paroxysmal episodes of aphasia.  CTH personally reviewed, shows small increase in left FP SDH but slightly decreased density, ventricular size mildly enlarged on coronal recons, more difficult to appreciate on axial series.   -Waxing/waning symptoms that spontaneously resolved without intervention are consistent with an electrophysiologic rather than structural problem like mass effect from a subdural hematoma or increased ICPs. Clinically, on exam, he does not have any findings of hydrocephalus.   -recommend admission with video EEG monitoring to evaluate for seizure or focal epileptiform activity, neurology consulted, defer to them about timing of initiating AEDs  -repeat CTH w/o contrast in AM to re-evaluate any change in ventricular size  -please call with any concerns or questions  Jadene Pierini, MD 07/28/18 8:39 PM [facility]

## 2018-07-28 NOTE — H&P (Signed)
History and Physical    [name] Frank:[id number] DOB: May 05, 1950 DOA: 07/28/2018  PCP: Pearline Cables, MD   Patient coming from: Home   Chief Complaint: Right facial droop, speech difficulty   HPI: [name] Frank is a 68 y.o. male with medical history significant for hypertension, type 2 diabetes mellitus, coronary artery disease, paroxysmal atrial fibrillation, and recent admission with traumatic subarachnoid hemorrhage and subdural hematoma, now presenting to the emergency department for evaluation of right facial droop and speech difficulty.  Patient was discharged from the hospital on 07/22/2018 after conservative management traumatic subarachnoid hemorrhage, diagnosed with paroxysmal atrial fibrillation during the admission, and restarted on aspirin.  He had been doing well after returning home, helped his son with his taxes, and was in good spirits until 3 days ago when he became lethargic and had some transient confusion.  He seemed to have improved some yesterday, was more active, not confused, but still more tired than usual.  He remains somewhat lethargic today, and then at approximately 5:45 PM, developed acute onset of right facial droop, some garbled speech, and then stopped speaking.  He seemed to improve some in the ED before worsening again; wife reports that his condition has been waxing and waning.  ED Course: Upon arrival to the ED, patient is found to be afebrile, saturating well on room air, slightly bradycardic, and with blood pressure 90/53.  EKG features sinus rhythm with rate 55 and nonspecific IVCD.  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  Chemistry panel is notable for a creatinine 1.25, bicarbonate 13, and sodium 133.  CBC features a leukocytosis to 13,000.  Neurology was consulted by the ED physician and recommends CTA head and neck, MRI brain, EEG, blood pressure less than 140,  and no anticoagulant or antiplatelet.  Neurosurgery was also consulted by the ED physician.  Hospitalists are asked to admit.  Review of Systems:  All other systems reviewed and apart from HPI, are negative.  Past Medical History:  Diagnosis Date   Diabetes mellitus    Heart disease    Hyperlipemia    Hypertension     Past Surgical History:  Procedure Laterality Date   catarracts     OTHER SURGICAL HISTORY  2016   HEART STENT   TONSILLECTOMY       reports that he has never smoked. He has never used smokeless tobacco. He reports current alcohol use. He reports that he does not use drugs.  No Known Allergies  Family History  Problem Relation Age of Onset   Stroke Father    Hypertension Father    Hyperlipidemia Father    Early death Mother    Colitis Sister    Appendicitis Brother      Prior to Admission medications   Medication Sig Start Date End Date Taking? Authorizing Provider  acetaminophen (TYLENOL) 325 MG tablet Take 2 tablets (650 mg total) by mouth every 6 (six) hours as needed for mild pain. 07/18/18  Yes Rayburn, Alphonsus Sias, PA-C  aspirin 81 MG chewable tablet Chew 81 mg by mouth daily.     Yes [provider]  atorvastatin (LIPITOR) 80 MG tablet Take 1 tablet (80 mg total) by mouth daily. 01/16/18  Yes Copland, Gwenlyn Found, MD  diltiazem (CARDIZEM CD) 360 MG 24 hr capsule Take 1 capsule (360 mg total) by mouth daily. 07/22/18  Yes Barnetta Chapel, PA-C  gabapentin (NEURONTIN) 300 MG capsule Take 1 capsule (300 mg  total) by mouth 3 (three) times daily. 07/18/18  Yes Rayburn, Tresa Endo A, PA-C  glipiZIDE (GLUCOTROL XL) 10 MG 24 hr tablet Take 1 tablet (10 mg total) by mouth 2 (two) times daily. 01/16/18  Yes Copland, Gwenlyn Found, MD  INVOKAMET [phone] MG TABS Take 1 tablet by mouth 2 (two) times daily. 01/16/18  Yes Copland, Gwenlyn Found, MD  losartan (COZAAR) 50 MG tablet Take 1 tablet (50 mg total) by mouth daily. 01/16/18  Yes Copland, Gwenlyn Found, MD    methocarbamol (ROBAXIN) 750 MG tablet Take 1 tablet (750 mg total) by mouth 3 (three) times daily. 07/18/18  Yes Rayburn, Tresa Endo A, PA-C  metoprolol succinate (TOPROL-XL) 100 MG 24 hr tablet Take 1 tablet (100 mg total) by mouth daily. Take with or immediately following a meal. 07/23/18  Yes Barnetta Chapel, PA-C  traMADol (ULTRAM) 50 MG tablet Take 1-2 tablets (50-100 mg total) by mouth every 6 (six) hours as needed for moderate pain or severe pain. 07/18/18  Yes Rayburn, Alphonsus Sias, PA-C  guaiFENesin (MUCINEX) 600 MG 12 hr tablet Take 1 tablet (600 mg total) by mouth 2 (two) times daily. Patient not taking: Reported on 07/28/2018 07/18/18   Marvel Plan, PA-C    Physical Exam: Vitals:   07/28/18 1909 07/28/18 1915 07/28/18 1930 07/28/18 1957  BP: (!) 90/53 (!) 97/56 (!) 102/54   Pulse: (!) 55 (!) 57 (!) 56   Resp: (!) 22 (!) 24 (!) 25   Temp:      TempSrc:      SpO2: 96% 95% 100%   Weight:      Height:     (1.753 m)    Constitutional: NAD, calm  Eyes: PERTLA, lids and conjunctivae normal ENMT: Mucous membranes are moist. Posterior pharynx clear of any exudate or lesions.   Neck: normal, supple, no masses, no thyromegaly Respiratory:  no wheezing, no crackles.  No accessory muscle use.  Cardiovascular: S1 & S2 heard, regular rate and rhythm. No extremity edema.   Abdomen: No distension, no tenderness, no masses palpated. Bowel sounds normal.  Musculoskeletal: no clubbing / cyanosis. No joint deformity upper and lower extremities.   Skin: no significant rashes, lesions, ulcers. Warm, dry, well-perfused. Neurologic: CN 2-12 grossly intact. Sensation intact. Moving all extremities.  Psychiatric:  Alert, but easily distracted and has difficulty following commands.    Labs on Admission: I have personally reviewed following labs and imaging studies  CBC: Recent Labs  Lab 07/28/18 1839 07/28/18 1842  WBC 13.0*  --   NEUTROABS 10.6*  --   HGB 13.7 13.9  HCT 42.5 41.0  MCV 91.0  --    PLT 313  --    Basic Metabolic Panel: Recent Labs  Lab 07/28/18 1839 07/28/18 1842  NA 133* 131*  K 4.6 4.5  CL 100 103  CO2 13*  --   GLUCOSE 184* 181*  BUN 21 20  CREATININE 1.25* 0.70  CALCIUM 9.1  --    GFR: Estimated Creatinine Clearance: 105.1 mL/min (by C-G formula based on SCr of 0.7 mg/dL). Liver Function Tests: Recent Labs  Lab 07/28/18 1839  AST 16  ALT 16  ALKPHOS 103  BILITOT 1.8*  PROT 6.9  ALBUMIN 3.5   No results for input(s): LIPASE, AMYLASE in the last 168 hours. No results for input(s): AMMONIA in the last 168 hours. Coagulation Profile: Recent Labs  Lab 07/28/18 1839  INR 1.2   Cardiac Enzymes: No results for input(s): CKTOTAL, CKMB, CKMBINDEX, TROPONINI  in the last 168 hours. BNP (last 3 results) No results for input(s): PROBNP in the last 8760 hours. HbA1C: No results for input(s): HGBA1C in the last 72 hours. CBG: Recent Labs  Lab 07/21/18 2111 07/22/18 0726 07/22/18 1229 07/28/18 1926  GLUCAP 252* 168* 224* 163*   Lipid Profile: No results for input(s): CHOL, HDL, LDLCALC, TRIG, CHOLHDL, LDLDIRECT in the last 72 hours. Thyroid Function Tests: No results for input(s): TSH, T4TOTAL, FREET4, T3FREE, THYROIDAB in the last 72 hours. Anemia Panel: No results for input(s): VITAMINB12, FOLATE, FERRITIN, TIBC, IRON, RETICCTPCT in the last 72 hours. Urine analysis:    Component Value Date/Time   COLORURINE STRAW (A) 07/14/2018 0319   APPEARANCEUR CLEAR 07/14/2018 0319   LABSPEC 1.030 07/14/2018 0319   PHURINE 5.0 07/14/2018 0319   GLUCOSEU >=500 (A) 07/14/2018 0319   HGBUR MODERATE (A) 07/14/2018 0319   BILIRUBINUR NEGATIVE 07/14/2018 0319   KETONESUR 5 (A) 07/14/2018 0319   PROTEINUR NEGATIVE 07/14/2018 0319   UROBILINOGEN 0.2 12/02/2006 1058   NITRITE NEGATIVE 07/14/2018 0319   LEUKOCYTESUR NEGATIVE 07/14/2018 0319   Sepsis Labs: (procalcitonin:4,lacticidven:4) )No results found for this or any previous visit  (from the past 240 hour(s)).   Radiological Exams on Admission: Ct Angio Head W Or Wo Contrast  Result Date: 07/28/2018 CLINICAL DATA:  68 y/o M; left-sided facial droop. Progressive somnolence. EXAM: CT ANGIOGRAPHY HEAD AND NECK TECHNIQUE: Multidetector CT imaging of the head and neck was performed using the standard protocol during bolus administration of intravenous contrast. Multiplanar CT image reconstructions and MIPs were obtained to evaluate the vascular anatomy. Carotid stenosis measurements (when applicable) are obtained utilizing NASCET criteria, using the distal internal carotid diameter as the denominator. CONTRAST:  75mL OMNIPAQUE IOHEXOL 350 MG/ML SOLN COMPARISON:  07/28/2018 CT of the head. FINDINGS: CTA NECK FINDINGS Aortic arch: Standard branching. Imaged portion shows no evidence of aneurysm or dissection. No significant stenosis of the major arch vessel origins. Mild aortic calcific atherosclerosis. Right carotid system: No evidence of dissection, stenosis (50% or greater) or occlusion. Mild non stenotic calcific atherosclerosis of carotid bifurcation. Left carotid system: No evidence of dissection, stenosis (50% or greater) or occlusion. Mild non stenotic calcific atherosclerosis of carotid bifurcation. Vertebral arteries: Left V1-V2 proximal V3 are occluded. The left distal V3 and V4 are diffusely irregular with attenuated enhancement and reversal of flow from the vertebrobasilar junction. There appears to be a cauda above the left C2 foramen transversarium (series 7, image 86). Widely patent and dominant right vertebral artery. Skeleton: No acute finding. Other neck: Negative. Upper chest: Right lung apex calcified granuloma. Review of the MIP images confirms the above findings CTA HEAD FINDINGS Anterior circulation: No significant stenosis, proximal occlusion, aneurysm, or vascular malformation. Posterior circulation: Left V1-V2 and proximal V3 are occluded, no visible dissection flap or  wall hematoma. The left distal V3 and V4 are diffusely irregular with attenuated enhancement and reversal of flow from the vertebrobasilar junction. Widely patent and dominant right vertebral artery. There appears to be a cauda above the left C2 foramen transversarium (series 7, image 86). Tandem segments of mild-to-moderate stenosis in the left P2 and P3 segments. Large PICA and SCA noted bilaterally. Otherwise no significant stenosis, proximal occlusion, aneurysm, or vascular malformation in the posterior circulation. Venous sinuses: As permitted by contrast timing, patent. Anatomic variants: Fetal left PCA. No right posterior communicating artery identified, likely hypoplastic or absent. Probable diminutive anterior communicating artery. Review of the MIP images confirms the above findings IMPRESSION: 1. Nonspecific  left V1-V2 and proximal V3 occlusion. There appears to be a cutoff above the left C2 foramen transversarium suggesting a recent thromboembolic occlusion or dissection. 2. Left distal V3 and V4 are diffusely irregular with attenuated enhancement and reversal of flow from vertebrobasilar junction. 3. Patent carotid systems and dominant right vertebral artery without dissection, aneurysm, or hemodynamically significant stenosis utilizing NASCET criteria. 4. Mild calcific atherosclerosis of aortic arch and carotid bifurcations. CTA HEAD:. 1. Patent Circle of Willis. No intracranial large vessel occlusion, aneurysm, or vascular malformation identified. 2. Tandem segments of mild-to-moderate stenosis in the left P2 and P3 segments. 3. Left V4 is diffusely irregular and attenuated with reversal of flow from the vertebrobasilar junction. 4. Fetal left PCA. These results were called by telephone at the time of interpretation on 07/28/2018 at 7:15 pm to Dr. Milon Dikes , who verbally acknowledged these results. Electronically Signed   By: Mitzi Hansen M.D.   On: 07/28/2018 19:18   Ct Angio Neck W  Or Wo Contrast  Result Date: 07/28/2018 CLINICAL DATA:  68 y/o M; left-sided facial droop. Progressive somnolence. EXAM: CT ANGIOGRAPHY HEAD AND NECK TECHNIQUE: Multidetector CT imaging of the head and neck was performed using the standard protocol during bolus administration of intravenous contrast. Multiplanar CT image reconstructions and MIPs were obtained to evaluate the vascular anatomy. Carotid stenosis measurements (when applicable) are obtained utilizing NASCET criteria, using the distal internal carotid diameter as the denominator. CONTRAST:  2mL OMNIPAQUE IOHEXOL 350 MG/ML SOLN COMPARISON:  07/28/2018 CT of the head. FINDINGS: CTA NECK FINDINGS Aortic arch: Standard branching. Imaged portion shows no evidence of aneurysm or dissection. No significant stenosis of the major arch vessel origins. Mild aortic calcific atherosclerosis. Right carotid system: No evidence of dissection, stenosis (50% or greater) or occlusion. Mild non stenotic calcific atherosclerosis of carotid bifurcation. Left carotid system: No evidence of dissection, stenosis (50% or greater) or occlusion. Mild non stenotic calcific atherosclerosis of carotid bifurcation. Vertebral arteries: Left V1-V2 proximal V3 are occluded. The left distal V3 and V4 are diffusely irregular with attenuated enhancement and reversal of flow from the vertebrobasilar junction. There appears to be a cauda above the left C2 foramen transversarium (series 7, image 86). Widely patent and dominant right vertebral artery. Skeleton: No acute finding. Other neck: Negative. Upper chest: Right lung apex calcified granuloma. Review of the MIP images confirms the above findings CTA HEAD FINDINGS Anterior circulation: No significant stenosis, proximal occlusion, aneurysm, or vascular malformation. Posterior circulation: Left V1-V2 and proximal V3 are occluded, no visible dissection flap or wall hematoma. The left distal V3 and V4 are diffusely irregular with attenuated  enhancement and reversal of flow from the vertebrobasilar junction. Widely patent and dominant right vertebral artery. There appears to be a cauda above the left C2 foramen transversarium (series 7, image 86). Tandem segments of mild-to-moderate stenosis in the left P2 and P3 segments. Large PICA and SCA noted bilaterally. Otherwise no significant stenosis, proximal occlusion, aneurysm, or vascular malformation in the posterior circulation. Venous sinuses: As permitted by contrast timing, patent. Anatomic variants: Fetal left PCA. No right posterior communicating artery identified, likely hypoplastic or absent. Probable diminutive anterior communicating artery. Review of the MIP images confirms the above findings IMPRESSION: 1. Nonspecific left V1-V2 and proximal V3 occlusion. There appears to be a cutoff above the left C2 foramen transversarium suggesting a recent thromboembolic occlusion or dissection. 2. Left distal V3 and V4 are diffusely irregular with attenuated enhancement and reversal of flow from vertebrobasilar junction. 3. Patent  carotid systems and dominant right vertebral artery without dissection, aneurysm, or hemodynamically significant stenosis utilizing NASCET criteria. 4. Mild calcific atherosclerosis of aortic arch and carotid bifurcations. CTA HEAD:. 1. Patent Circle of Willis. No intracranial large vessel occlusion, aneurysm, or vascular malformation identified. 2. Tandem segments of mild-to-moderate stenosis in the left P2 and P3 segments. 3. Left V4 is diffusely irregular and attenuated with reversal of flow from the vertebrobasilar junction. 4. Fetal left PCA. These results were called by telephone at the time of interpretation on 07/28/2018 at 7:15 pm to Dr. Milon DikesASHISH ARORA , who verbally acknowledged these results. Electronically Signed   By: Mitzi HansenLance  Furusawa-Stratton M.D.   On: 07/28/2018 19:18   Ct Head Code Stroke Wo Contrast  Result Date: 07/28/2018 CLINICAL DATA:  Code stroke.  LEFT-sided facial droop. Progressive somnolence. Last seen normal 1745 hours. EXAM: CT HEAD WITHOUT CONTRAST TECHNIQUE: Contiguous axial images were obtained from the base of the skull through the vertex without intravenous contrast. COMPARISON:  Multiple priors, most recent 07/17/2018 FINDINGS: Brain: No acute stroke or parenchymal hemorrhage. No intracranial intra-axial mass lesion. Developing hydrocephalus, biventricular diameter now 52 mm, as compared with 46 mm on 05/07. LEFT frontoparietal subdural collection is also increased, now 8 mm as compared with 4-5 mm previous. The attenuation of the subdural collection is now becoming more isodense, with some slight hyperdense residual collection. Intraventricular blood is improving but still layering. Subarachnoid blood is resolving. Vascular: No hyperdense vessel or unexpected calcification. Skull: Normal. Negative for fracture or focal lesion. Sinuses/Orbits: Negative Other: None ASPECTS ([place]ber[place] Stroke Program Early CT Score) - Ganglionic level infarction (caudate, lentiform nuclei, internal capsule, insula, M1-M3 cortex): 7 - Supraganglionic infarction (M4-M6 cortex): 3 Total score (0-10 with 10 being normal): 10 IMPRESSION: 1. 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 Biventricular diameter is 52 mm, which is significant. Resolving subarachnoid blood. 2. Increasing size of LEFT frontoparietal subdural collection, now 8 mm. 3. ASPECTS is 10. * These results were called by telephone at the time of interpretation on 07/28/2018 at 6:50 pm to Dr. Wilford CornerArora , who verbally acknowledged these results. Electronically Signed   By: Elsie StainJohn T Curnes M.D.   On: 07/28/2018 18:56    EKG: Independently reviewed. Sinus rhythm, rate 55, non-specific IVCD.   Assessment/Plan   1. 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 Paroxysmal atrial fibrillation  - In sinus rhythm on admission  - He was noted to develop a fib with RVR during recent admission  - CHADS-VASc is at least 4 (age, CAD, HTN, DM)  - No anticoagulation in light of recent SAH, continue diltiazem and metoprolol as tolerated    3. CAD  - No anginal complaints  - Continue statin and beta-blocker, hold ASA and ARB initially   4. Hypertension  - BP low-normal in ED  - Continue diltiazem and metoprolol as tolerated, hold losartan initially    5. Mild renal insufficiency; acidosis  - SCr is 1.25 on admission, up from apparent baseline of ~0.8; serum bicarb is 13 with AG of 20   - Check FENa,  check ABG, continue IVF hydration, repeat chem panel in am   6. Type II DM  - A1c was 8.6% two weeks ago  - Managed with glipizide and Invokamet at home, held on admission  - Check CBG's and use a SSI with Novolog while in hospital    PPE: Mask, face shield  DVT prophylaxis: SCD's  Code Status: Full  Family Communication: Wife updated at bedside Consults called: Neurology, neurosurgery Admission status: Observation     Briscoe Deutscher, MD [facility] Pager [phone]  If 7PM-7AM, please contact night-coverage [email or web address] Password [email or web address]  07/28/2018, 8:11 PM

## 2018-07-28 NOTE — ED Notes (Signed)
Pt's Sps. Jocob Zaloudek Ph#  [phone]([place])  [phone](Home). Pls contact with updates.

## 2018-07-28 NOTE — Telephone Encounter (Signed)
Copied from CRM [id number]. Topic: General - Other >> Jul 28, 2018  3:15 PM Dalphine Handing A wrote: Reason for CRM: Lanora Manis  who is Development worker, international aid at [facility] is requesting a nursing evaluation order for a general assessment and to check urine. Patient is complaining of not being able to urinate, fatigue, no fever, and a swollen scrotum. Lanora Manis would like a callback. [phone]

## 2018-07-28 NOTE — ED Notes (Signed)
Attempted report x 2 

## 2018-07-28 NOTE — Consult Note (Addendum)
Neurology Consultation  Reason for Consult: code stroke - right facial droop Referring Physician: Dr Rush Landmarkegeler  CC: Right facial droop. Speech disturbance  History is obtained from: patient, spouse  HPI: [name] Frank is a 68 y.o. male DM, HTN, recently diagnosed Afib not on AC due to a traumatic SDH in early May, who was in his usual state of health after discharge recovering from a subdural hematoma, his wife started noticing about 3 days ago that he has become more somnolent and he kept on becoming more somnolent than usual.  It was not until 5:45 PM today on 07/28/2018 that the wife noticed that he had a sudden onset of garbled speech and right facial droop. For this she called EMS, they evaluated him on scene.  Blood pressures were in the normal range.  CBG was in the normal range.  Activated a code stroke and brought him to the emergency room.  [facility] at [facility] for further evaluation. Patient is not a candidate for IV TPA due to a very recent subdural and subarachnoid hemorrhage- traumatic. No preceding illnesses or sicknesses prior to presentation.  Wife also said that since discharge, there have been waxing and waning lucid and non lucid intervals but most start changes have been 3 days ago with increasing somnolence and at 545 pm with speech problem and pronounced right lower face weakness.   LKW: Sudden change 5:45 PM on 07/28/2018 tpa given?: no, recent subdural hematoma Premorbid modified Rankin scale (mRS): 3  ROS: ROS was performed and is negative except as noted in the HPI.  Past Medical History:  Diagnosis Date  . Diabetes mellitus   . Heart disease   . Hyperlipemia   . Hypertension     Family History  Problem Relation Age of Onset  . Stroke Father   . Hypertension Father   . Hyperlipidemia Father   . Early death Mother   . Colitis Sister   . Appendicitis Brother     Social History:   reports that he has never smoked. He has never used smokeless  tobacco. He reports current alcohol use. He reports that he does not use drugs.  Medications No current facility-administered medications for this encounter.   Current Outpatient Medications:  .  acetaminophen (TYLENOL) 325 MG tablet, Take 2 tablets (650 mg total) by mouth every 6 (six) hours as needed for mild pain., Disp: , Rfl:  .  aspirin 81 MG chewable tablet, Chew 81 mg by mouth daily.  , Disp: , Rfl:  .  atorvastatin (LIPITOR) 80 MG tablet, Take 1 tablet (80 mg total) by mouth daily. (Patient taking differently: Take 40 mg by mouth daily. ), Disp: 90 tablet, Rfl: 3 .  diltiazem (CARDIZEM CD) 360 MG 24 hr capsule, Take 1 capsule (360 mg total) by mouth daily., Disp: 30 capsule, Rfl: 0 .  gabapentin (NEURONTIN) 300 MG capsule, Take 1 capsule (300 mg total) by mouth 3 (three) times daily., Disp: 90 capsule, Rfl: 0 .  glipiZIDE (GLUCOTROL XL) 10 MG 24 hr tablet, Take 1 tablet (10 mg total) by mouth 2 (two) times daily., Disp: 180 tablet, Rfl: 3 .  guaiFENesin (MUCINEX) 600 MG 12 hr tablet, Take 1 tablet (600 mg total) by mouth 2 (two) times daily., Disp: , Rfl:  .  INVOKAMET [phone] MG TABS, Take 1 tablet by mouth 2 (two) times daily., Disp: 180 tablet, Rfl: 3 .  losartan (COZAAR) 50 MG tablet, Take 1 tablet (50 mg total) by mouth daily.,  Disp: 90 tablet, Rfl: 3 .  methocarbamol (ROBAXIN) 750 MG tablet, Take 1 tablet (750 mg total) by mouth 3 (three) times daily., Disp: 60 tablet, Rfl: 0 .  metoprolol succinate (TOPROL-XL) 100 MG 24 hr tablet, Take 1 tablet (100 mg total) by mouth daily. Take with or immediately following a meal., Disp: 30 tablet, Rfl: 0 .  traMADol (ULTRAM) 50 MG tablet, Take 1-2 tablets (50-100 mg total) by mouth every 6 (six) hours as needed for moderate pain or severe pain., Disp: 30 tablet, Rfl: 0  Exam: Current vital signs: There were no vitals taken for this visit. Vital signs in last 24 hours:   Gen: Awake, no alert HEENT: [place] AT MMM Lungs: CTABL Abd: ND  NT Neurological Patient is awake, alert. He is able to tell me "I am fine". He is not able to tell me his name.  He is nonverbal to me his date. He is able to follow some simple midline commands like sticking his tongue out and closing his eyes. He is able to mimic some actions leg raising his arm but unable to follow multistep commands. Evidence of both receptive and expressive aphasia. Mild dysarthria Cranial nerves: Pupils equal round react light, extraocular movements intact, visual fields are full to threat, mild right lower facial weakness, facial sensation appears intact, auditory daily appears intact, tongue midline palate midline. Motor exam: Grossly antigravity in all fours without drift although he has poor attention concentration and keeps dropping his legs but on coaching is able to lift both legs antigravity. Sensory exam: Intact light touch Coordination: Did not cooperate for the exam NIH stroke scale 6  Labs I have reviewed labs in epic and the results pertinent to this consultation are:  CBC    Component Value Date/Time   WBC 6.1 07/18/2018 1401   RBC 4.30 07/18/2018 1401   HGB 12.7 (L) 07/18/2018 1401   HCT 37.7 (L) 07/18/2018 1401   PLT 194 07/18/2018 1401   MCV 87.7 07/18/2018 1401   MCH 29.5 07/18/2018 1401   MCHC 33.7 07/18/2018 1401   RDW 13.4 07/18/2018 1401   LYMPHSABS 1.9 07/14/2018 0153   MONOABS 0.7 07/14/2018 0153   EOSABS 0.1 07/14/2018 0153   BASOSABS 0.1 07/14/2018 0153    CMP     Component Value Date/Time   NA 136 07/18/2018 1401   K 4.8 07/18/2018 1401   CL 101 07/18/2018 1401   CO2 18 (L) 07/18/2018 1401   GLUCOSE 361 (H) 07/18/2018 1401   BUN 31 (H) 07/18/2018 1401   CREATININE 1.09 07/18/2018 1401   CALCIUM 8.7 (L) 07/18/2018 1401   PROT 6.5 07/14/2018 0153   ALBUMIN 4.1 07/14/2018 0153   AST 36 07/14/2018 0153   ALT 41 07/14/2018 0153   ALKPHOS 48 07/14/2018 0153   BILITOT 0.8 07/14/2018 0153   GFRNONAA >60 07/18/2018 1401    GFRAA >60 07/18/2018 1401   Imaging I have reviewed the images obtained:  CT-scan of the brain-small left-sided subdural, improved from prior but increasing hydrocephalus and intraventricular blood in the right posterior horn of the lateral ventricle. CTA head and neck-formal read pending, no emergent LVO with possible recent left vertebral occlusion/stenosis (?thrombus or dissection  - can not say for sure)  Assessment: 68 year old with a recent Subdural hematoma on the left with essentially some difficulty ambulating but no cognitive deficits with about 3 days worth of increasing sleepiness and at 5:45 PM today sudden onset of right facial droop and speech problems brought in  as a code stroke. CT head still shows some residual left sided subdural hematoma-hence not a candidate for IV TPA. CTA head and neck with possible recent left vertebral thrombus or dissection (I suspect this is probably from trauma) or cardioembolic. I suspect that his current presentation is likely secondary to developing hydrocephalus from the intraventricular blood - but can not rule out a stroke due to his recent fall causing vertebral  Art dissection or cardioembolic thrombus. I pursue the CTA head and neck to rule out any emergent LVO since he had a new diagnosis of A. fib but anticoagulation could not be started because of a subdural.  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 -Not a candidate for EVT due to mRS 3 -EEG in the morning -Blood pressure below 140 -No antiplatelets or anticoagulants at this time-was started on aspirin before discharge but I would recommend holding that in case he needs any kind of surgical  intervention. -Minimize sedating medication  We will follow with you  -- Milon Dikes, MD [facility] Pager: [phone] If 7pm to 7am, please call on call as listed on AMION.   CRITICAL CARE ATTESTATION Performed by: Milon Dikes, MD Total critical care time: 50 minutes Critical care time was exclusive of separately billable procedures and treating other patients and/or supervising APPs/Residents/Students Critical care was necessary to treat or prevent imminent or life-threatening deterioration due to SDH, hydrocephalus  This patient is critically ill and at significant risk for neurological worsening and/or death and care requires constant monitoring. Critical care was time spent personally by me on the following activities: development of treatment plan with patient and/or surrogate as well as nursing, discussions with consultants, evaluation of patient's response to treatment, examination of patient, obtaining history from patient or surrogate, ordering and performing treatments and interventions, ordering and review of laboratory studies, ordering and review of radiographic studies, pulse oximetry, re-evaluation of patient's condition, participation in multidisciplinary rounds and medical decision making of high complexity in the care of this patient.

## 2018-07-28 NOTE — ED Notes (Signed)
EDP at bedside with wife discussing plan of care.

## 2018-07-28 NOTE — Telephone Encounter (Signed)
Copied from CRM [id number]. Topic: Quick Communication - Home Health Verbal Orders >> Jul 28, 2018  4:48 PM Richarda Blade wrote: Caller/Agency: [facility] Number: [phone] Samy[place] Requesting OT/PT/Skilled Nursing/Social Work/Speech Therapy: Physical therapy and Skilled nursing Frequency: 2 times a week for 3 weeks Nursing evaluation

## 2018-07-28 NOTE — Telephone Encounter (Signed)
I called [name] Frank and left a message on her phone, gave my cell phone so she can call me back  Called and spoke to his wife [name] Frank She has noted that he seems to be gradually more fatigued, he did seem confused on Saturday but not today- however he seems to have less energy and to be feeling worse  They have not noted any fever He is able to pass urine into a diaper but has not really peed today- she is not sure if he can't pass urine or just does not need to go.  In any case I am concerned that his condition seems to be worsening, he may have a UTI causing confusion  She will take him back to [facility] for evaluation. [name] Frank thinks she can get him in the car safely and drive him.   If he is not willing to go I encouraged her to call 911 - EMTs can then evaluate and perhaps take to ER if needed

## 2018-07-28 NOTE — ED Notes (Signed)
ED TO INPATIENT HANDOFF REPORT  ED Nurse Name and Phone #:  [phone]  S Name/Age/Gender [name] Frank 68 y.o. male Room/Bed: 031C/031C  Code Status   Code Status: Full Code  Home/SNF/Other Home Patient oriented to: self, place, time and situation Is this baseline? Yes   Triage Complete: Triage complete  Chief Complaint CODE STROKE   Triage Note Patient arrived from home via [facility] with reports of right face droop,  not following commands, difficulty ambulating. Patient's wife reports that PT has had increased sleepiness in the last 3 days and has increasingly had difficulty with following commands and ambulation.  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 Diabetes mellitus   . Heart disease   . Hyperlipemia   . Hypertension    Past Surgical History:  Procedure Laterality Date  . catarracts    . OTHER SURGICAL HISTORY  2016   HEART STENT  . TONSILLECTOMY       A IV Location/Drains/Wounds Patient Lines/Drains/Airways Status   Active Line/Drains/Airways    Name:   Placement date:   Placement time:   Site:   Days:   Peripheral IV 07/28/18 Left Antecubital   07/28/18    1847    Antecubital   less than 1          Intake/Output Last 24 hours No intake or output data in the 24 hours ending 07/28/18  2028  Labs/Imaging Results for orders placed or performed during the hospital encounter of 07/28/18 (from the past 48 hour(s))  Protime-INR     Status: None   Collection Time: 07/28/18  6:39 PM  Result Value Ref Range   Prothrombin Time 15.0 11.4 - 15.2 seconds   INR 1.2 0.8 - 1.2    Comment: (NOTE) INR goal varies based on device and disease states. Performed at [facility], [address] [place], [place] [address]   APTT     Status: None   Collection Time: 07/28/18  6:39 PM  Result Value Ref Range   aPTT 29 24 - 36 seconds    Comment: Performed at [facility], [address] [place], [place] [address]  CBC     Status: Abnormal   Collection Time: 07/28/18  6:39 PM  Result Value Ref Range   WBC 13.0 (H) 4.0 - 10.5 K/uL   RBC 4.67 4.22 - 5.81 MIL/uL   Hemoglobin 13.7 13.0 - 17.0 g/dL   HCT 25.9 56.3 - 87.5 %   MCV 91.0 80.0 - 100.0 fL   MCH 29.3 26.0 - 34.0 pg   MCHC 32.2 30.0 - 36.0 g/dL   RDW 64.3 32.9 - 51.8 %   Platelets 313 150 - 400 K/uL   nRBC 0.0 0.0 - 0.2 %    Comment: Performed at [facility], [address], [place], [place] [address]  Differential     Status: Abnormal   Collection Time: 07/28/18  6:39 PM  Result Value Ref Range   Neutrophils Relative % 80 %   Neutro Abs 10.6 (H) 1.7 - 7.7 K/uL   Lymphocytes Relative 8 %   Lymphs Abs 1.0 0.7 - 4.0 K/uL   Monocytes Relative 9 %   Monocytes Absolute 1.1 (H) 0.1 - 1.0 K/uL   Eosinophils Relative 1 %   Eosinophils Absolute 0.1 0.0 - 0.5 K/uL   Basophils Relative 1 %   Basophils Absolute 0.1 0.0 - 0.1 K/uL   Immature Granulocytes 1 %   Abs Immature Granulocytes 0.12 (H) 0.00 - 0.07 K/uL    Comment: Performed at [facility], [address] [place], [place] [address]  Comprehensive metabolic panel     Status: Abnormal   Collection Time: 07/28/18  6:39 PM  Result Value Ref Range   Sodium 133 (L) 135 - 145 mmol/L   Potassium 4.6 3.5 - 5.1 mmol/L   Chloride 100 98 - 111 mmol/L    CO2 13 (L) 22 - 32 mmol/L   Glucose, Bld 184 (H) 70 - 99 mg/dL   BUN 21 8 - 23 mg/dL   Creatinine, Ser 8.29 (H) 0.61 - 1.24 mg/dL   Calcium 9.1 8.9 - 56.2 mg/dL   Total Protein 6.9 6.5 - 8.1 g/dL   Albumin 3.5 3.5 - 5.0 g/dL   AST 16 15 - 41 U/L   ALT 16 0 - 44 U/L   Alkaline Phosphatase 103 38 - 126 U/L   Total Bilirubin 1.8 (H) 0.3 - 1.2 mg/dL   GFR calc non Af Amer >60 >60 mL/min   GFR calc Af Amer >60 >60 mL/min   Anion gap 20 (H) 5 - 15    Comment: Performed at [facility], [address], [place], [place] [address]  I-stat chem 8, ED [facility] and [facility] only)     Status: Abnormal   Collection Time: 07/28/18  6:42 PM  Result Value Ref Range   Sodium 131 (L) 135 - 145 mmol/L   Potassium 4.5 3.5 - 5.1 mmol/L   Chloride 103 98 - 111 mmol/L   BUN 20 8 - 23 mg/dL   Creatinine, Ser 5.78 0.61 - 1.24 mg/dL   Glucose, Bld 469 (H) 70 - 99 mg/dL   Calcium, Ion 6.29 (L) 1.15 - 1.40 mmol/L   TCO2 14 (L) 22 - 32 mmol/L   Hemoglobin 13.9 13.0 - 17.0 g/dL   HCT 52.8 41.3 - 24.4 %  CBG monitoring, ED     Status: Abnormal   Collection Time: 07/28/18  7:26 PM  Result Value Ref Range   Glucose-Capillary 163 (H) 70 - 99 mg/dL   Ct Angio Head W Or Wo Contrast  Result Date: 07/28/2018 CLINICAL DATA:  68 y/o M; left-sided facial droop. Progressive somnolence. EXAM: CT ANGIOGRAPHY HEAD AND NECK TECHNIQUE: Multidetector CT imaging of the head and neck was performed using the standard protocol during bolus administration of intravenous contrast. Multiplanar CT image reconstructions and MIPs were obtained to evaluate the vascular anatomy. Carotid stenosis measurements (when applicable) are obtained utilizing NASCET criteria, using the distal internal carotid diameter as the denominator. CONTRAST:  75mL OMNIPAQUE IOHEXOL 350 MG/ML SOLN COMPARISON:  07/28/2018 CT of the head. FINDINGS: CTA NECK FINDINGS Aortic arch: Standard branching. Imaged portion shows no evidence of aneurysm or dissection. No  significant stenosis of the major arch vessel origins.  Mild aortic calcific atherosclerosis. Right carotid system: No evidence of dissection, stenosis (50% or greater) or occlusion. Mild non stenotic calcific atherosclerosis of carotid bifurcation. Left carotid system: No evidence of dissection, stenosis (50% or greater) or occlusion. Mild non stenotic calcific atherosclerosis of carotid bifurcation. Vertebral arteries: Left V1-V2 proximal V3 are occluded. The left distal V3 and V4 are diffusely irregular with attenuated enhancement and reversal of flow from the vertebrobasilar junction. There appears to be a cauda above the left C2 foramen transversarium (series 7, image 86). Widely patent and dominant right vertebral artery. Skeleton: No acute finding. Other neck: Negative. Upper chest: Right lung apex calcified granuloma. Review of the MIP images confirms the above findings CTA HEAD FINDINGS Anterior circulation: No significant stenosis, proximal occlusion, aneurysm, or vascular malformation. Posterior circulation: Left V1-V2 and proximal V3 are occluded, no visible dissection flap or wall hematoma. The left distal V3 and V4 are diffusely irregular with attenuated enhancement and reversal of flow from the vertebrobasilar junction. Widely patent and dominant right vertebral artery. There appears to be a cauda above the left C2 foramen transversarium (series 7, image 86). Tandem segments of mild-to-moderate stenosis in the left P2 and P3 segments. Large PICA and SCA noted bilaterally. Otherwise no significant stenosis, proximal occlusion, aneurysm, or vascular malformation in the posterior circulation. Venous sinuses: As permitted by contrast timing, patent. Anatomic variants: Fetal left PCA. No right posterior communicating artery identified, likely hypoplastic or absent. Probable diminutive anterior communicating artery. Review of the MIP images confirms the above findings IMPRESSION: 1. Nonspecific left V1-V2  and proximal V3 occlusion. There appears to be a cutoff above the left C2 foramen transversarium suggesting a recent thromboembolic occlusion or dissection. 2. Left distal V3 and V4 are diffusely irregular with attenuated enhancement and reversal of flow from vertebrobasilar junction. 3. Patent carotid systems and dominant right vertebral artery without dissection, aneurysm, or hemodynamically significant stenosis utilizing NASCET criteria. 4. Mild calcific atherosclerosis of aortic arch and carotid bifurcations. CTA HEAD:. 1. Patent Circle of Willis. No intracranial large vessel occlusion, aneurysm, or vascular malformation identified. 2. Tandem segments of mild-to-moderate stenosis in the left P2 and P3 segments. 3. Left V4 is diffusely irregular and attenuated with reversal of flow from the vertebrobasilar junction. 4. Fetal left PCA. These results were called by telephone at the time of interpretation on 07/28/2018 at 7:15 pm to Dr. Milon Dikes , who verbally acknowledged these results. Electronically Signed   By: Mitzi Hansen M.D.   On: 07/28/2018 19:18   Ct Angio Neck W Or Wo Contrast  Result Date: 07/28/2018 CLINICAL DATA:  68 y/o M; left-sided facial droop. Progressive somnolence. EXAM: CT ANGIOGRAPHY HEAD AND NECK TECHNIQUE: Multidetector CT imaging of the head and neck was performed using the standard protocol during bolus administration of intravenous contrast. Multiplanar CT image reconstructions and MIPs were obtained to evaluate the vascular anatomy. Carotid stenosis measurements (when applicable) are obtained utilizing NASCET criteria, using the distal internal carotid diameter as the denominator. CONTRAST:  34mL OMNIPAQUE IOHEXOL 350 MG/ML SOLN COMPARISON:  07/28/2018 CT of the head. FINDINGS: CTA NECK FINDINGS Aortic arch: Standard branching. Imaged portion shows no evidence of aneurysm or dissection. No significant stenosis of the major arch vessel origins. Mild aortic calcific  atherosclerosis. Right carotid system: No evidence of dissection, stenosis (50% or greater) or occlusion. Mild non stenotic calcific atherosclerosis of carotid bifurcation. Left carotid system: No evidence of dissection, stenosis (50% or greater) or occlusion. Mild non stenotic calcific atherosclerosis of carotid bifurcation.  Vertebral arteries: Left V1-V2 proximal V3 are occluded. The left distal V3 and V4 are diffusely irregular with attenuated enhancement and reversal of flow from the vertebrobasilar junction. There appears to be a cauda above the left C2 foramen transversarium (series 7, image 86). Widely patent and dominant right vertebral artery. Skeleton: No acute finding. Other neck: Negative. Upper chest: Right lung apex calcified granuloma. Review of the MIP images confirms the above findings CTA HEAD FINDINGS Anterior circulation: No significant stenosis, proximal occlusion, aneurysm, or vascular malformation. Posterior circulation: Left V1-V2 and proximal V3 are occluded, no visible dissection flap or wall hematoma. The left distal V3 and V4 are diffusely irregular with attenuated enhancement and reversal of flow from the vertebrobasilar junction. Widely patent and dominant right vertebral artery. There appears to be a cauda above the left C2 foramen transversarium (series 7, image 86). Tandem segments of mild-to-moderate stenosis in the left P2 and P3 segments. Large PICA and SCA noted bilaterally. Otherwise no significant stenosis, proximal occlusion, aneurysm, or vascular malformation in the posterior circulation. Venous sinuses: As permitted by contrast timing, patent. Anatomic variants: Fetal left PCA. No right posterior communicating artery identified, likely hypoplastic or absent. Probable diminutive anterior communicating artery. Review of the MIP images confirms the above findings IMPRESSION: 1. Nonspecific left V1-V2 and proximal V3 occlusion. There appears to be a cutoff above the left C2  foramen transversarium suggesting a recent thromboembolic occlusion or dissection. 2. Left distal V3 and V4 are diffusely irregular with attenuated enhancement and reversal of flow from vertebrobasilar junction. 3. Patent carotid systems and dominant right vertebral artery without dissection, aneurysm, or hemodynamically significant stenosis utilizing NASCET criteria. 4. Mild calcific atherosclerosis of aortic arch and carotid bifurcations. CTA HEAD:. 1. Patent Circle of Willis. No intracranial large vessel occlusion, aneurysm, or vascular malformation identified. 2. Tandem segments of mild-to-moderate stenosis in the left P2 and P3 segments. 3. Left V4 is diffusely irregular and attenuated with reversal of flow from the vertebrobasilar junction. 4. Fetal left PCA. These results were called by telephone at the time of interpretation on 07/28/2018 at 7:15 pm to Dr. Milon DikesASHISH ARORA , who verbally acknowledged these results. Electronically Signed   By: Mitzi HansenLance  Furusawa-Stratton M.D.   On: 07/28/2018 19:18   Ct Head Code Stroke Wo Contrast  Result Date: 07/28/2018 CLINICAL DATA:  Code stroke. LEFT-sided facial droop. Progressive somnolence. Last seen normal 1745 hours. EXAM: CT HEAD WITHOUT CONTRAST TECHNIQUE: Contiguous axial images were obtained from the base of the skull through the vertex without intravenous contrast. COMPARISON:  Multiple priors, most recent 07/17/2018 FINDINGS: Brain: No acute stroke or parenchymal hemorrhage. No intracranial intra-axial mass lesion. Developing hydrocephalus, biventricular diameter now 52 mm, as compared with 46 mm on 05/07. LEFT frontoparietal subdural collection is also increased, now 8 mm as compared with 4-5 mm previous. The attenuation of the subdural collection is now becoming more isodense, with some slight hyperdense residual collection. Intraventricular blood is improving but still layering. Subarachnoid blood is resolving. Vascular: No hyperdense vessel or unexpected  calcification. Skull: Normal. Negative for fracture or focal lesion. Sinuses/Orbits: Negative Other: None ASPECTS ([place]berta Stroke Program Early CT Score) - Ganglionic level infarction (caudate, lentiform nuclei, internal capsule, insula, M1-M3 cortex): 7 - Supraganglionic infarction (M4-M6 cortex): 3 Total score (0-10 with 10 being normal): 10 IMPRESSION: 1. 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 Biventricular diameter is 52 mm, which is significant. Resolving subarachnoid blood. 2. Increasing size of LEFT frontoparietal subdural collection, now 8 mm. 3.  ASPECTS is 10. * These results were called by telephone at the time of interpretation on 07/28/2018 at 6:50 pm to Dr. Wilford Corner , who verbally acknowledged these results. Electronically Signed   By: Elsie Stain M.D.   On: 07/28/2018 18:56    Pending Labs Unresulted Labs (From admission, onward)    Start     Ordered   07/29/18 0500  Basic metabolic panel  Tomorrow morning,   R     07/28/18 2009   07/29/18 0500  CBC WITH DIFFERENTIAL  Tomorrow morning,   R     07/28/18 2009   07/28/18 2015  Sodium, urine, random  Once,   R     07/28/18 2014   07/28/18 2015  Creatinine, urine, random  Add-on,   R     07/28/18 2014   07/28/18 2015  Urinalysis, Complete w Microscopic  Once,   R     07/28/18 2014   07/28/18 2015  Blood gas, arterial  Once,   R     07/28/18 2014   07/28/18 2006  SARS Coronavirus 2 (CEPHEID - Performed in [facility] hospital lab), Hosp Order  (Asymptomatic Patients Labs)  Once,   R    Question:  Rule Out  Answer:  Yes   07/28/18 2005          Vitals/Pain Today's Vitals   07/28/18 1909 07/28/18 1915 07/28/18 1930 07/28/18 1957  BP: (!) 90/53 (!) 97/56 (!) 102/54   Pulse: (!) 55 (!) 57 (!) 56   Resp: (!) 22 (!) 24 (!) 25   Temp:      TempSrc:      SpO2: 96% 95% 100%   Weight:      Height:     (1.753 m)  PainSc:        Isolation Precautions No active  isolations  Medications Medications  sodium chloride flush (NS) 0.9 % injection 3 mL (has no administration in time range)  atorvastatin (LIPITOR) tablet 80 mg (has no administration in time range)  diltiazem (CARDIZEM CD) 24 hr capsule 360 mg (has no administration in time range)  metoprolol succinate (TOPROL-XL) 24 hr tablet 100 mg (has no administration in time range)  sodium chloride flush (NS) 0.9 % injection 3 mL (has no administration in time range)  0.9 %  sodium chloride infusion (has no administration in time range)  acetaminophen (TYLENOL) tablet 650 mg (has no administration in time range)    Or  acetaminophen (TYLENOL) suppository 650 mg (has no administration in time range)  ondansetron (ZOFRAN) tablet 4 mg (has no administration in time range)    Or  ondansetron (ZOFRAN) injection 4 mg (has no administration in time range)  polyethylene glycol (MIRALAX / GLYCOLAX) packet 17 g (has no administration in time range)  hydrALAZINE (APRESOLINE) injection 5 mg (has no administration in time range)  fentaNYL (SUBLIMAZE) injection 12.5-25 mcg (has no administration in time range)  insulin aspart (novoLOG) injection 0-9 Units (has no administration in time range)  iohexol (OMNIPAQUE) 350 MG/ML injection 75 mL (75 mLs Intravenous Contrast Given 07/28/18 1849)    Mobility walks with device High fall risk   Focused Assessments    R Recommendations: See Admitting Provider Note  Report given to:   Additional Notes:

## 2018-07-28 NOTE — ED Notes (Signed)
Wife at bedside.

## 2018-07-28 NOTE — Telephone Encounter (Signed)
LMOM verbal orders given.  

## 2018-07-28 NOTE — ED Provider Notes (Addendum)
[facility] EMERGENCY DEPARTMENT Provider Note   CSN: [id number] Arrival date & time: 07/28/18  1841    History   Chief Complaint No chief complaint on file. Code Stroke  HPI [name] Frank is a 68 y.o. male.     The history is provided by the spouse and medical records. No language interpreter was used.  Neurologic Problem  This is a new problem. The current episode started 1 to 2 hours ago. The problem occurs constantly. The problem has not changed since onset.Associated symptoms include headaches. Pertinent negatives include no chest pain, no abdominal pain and no shortness of breath. Nothing aggravates the symptoms. Nothing relieves the symptoms. He has tried nothing for the symptoms. The treatment provided no relief.    Past Medical History:  Diagnosis Date   Diabetes mellitus    Heart disease    Hyperlipemia    Hypertension     Patient Active Problem List   Diagnosis Date Noted   Subdural hematoma (HCC) 07/14/2018   Hypertension, essential 01/16/2018   Controlled type 2 diabetes mellitus without complication, without long-term current use of insulin (HCC) 01/16/2018   Coronary artery disease involving native heart without angina pectoris 01/16/2018   DIABETES MELLITUS, TYPE II 01/13/2011    Past Surgical History:  Procedure Laterality Date   catarracts     OTHER SURGICAL HISTORY  2016   HEART STENT   TONSILLECTOMY          Home Medications    Prior to Admission medications   Medication Sig Start Date End Date Taking? Authorizing Provider  acetaminophen (TYLENOL) 325 MG tablet Take 2 tablets (650 mg total) by mouth every 6 (six) hours as needed for mild pain. 07/18/18   Rayburn, Alphonsus Sias, PA-C  aspirin 81 MG chewable tablet Chew 81 mg by mouth daily.      [provider]  atorvastatin (LIPITOR) 80 MG tablet Take 1 tablet (80 mg total) by mouth daily. Patient taking differently: Take 40 mg by mouth daily.  01/16/18    Copland, Gwenlyn Found, MD  diltiazem (CARDIZEM CD) 360 MG 24 hr capsule Take 1 capsule (360 mg total) by mouth daily. 07/22/18   Barnetta Chapel, PA-C  gabapentin (NEURONTIN) 300 MG capsule Take 1 capsule (300 mg total) by mouth 3 (three) times daily. 07/18/18   Rayburn, Tresa Endo A, PA-C  glipiZIDE (GLUCOTROL XL) 10 MG 24 hr tablet Take 1 tablet (10 mg total) by mouth 2 (two) times daily. 01/16/18   Copland, Gwenlyn Found, MD  guaiFENesin (MUCINEX) 600 MG 12 hr tablet Take 1 tablet (600 mg total) by mouth 2 (two) times daily. 07/18/18   Rayburn, Kelly A, PA-C  INVOKAMET [phone] MG TABS Take 1 tablet by mouth 2 (two) times daily. 01/16/18   Copland, Gwenlyn Found, MD  losartan (COZAAR) 50 MG tablet Take 1 tablet (50 mg total) by mouth daily. 01/16/18   Copland, Gwenlyn Found, MD  methocarbamol (ROBAXIN) 750 MG tablet Take 1 tablet (750 mg total) by mouth 3 (three) times daily. 07/18/18   Rayburn, Alphonsus Sias, PA-C  metoprolol succinate (TOPROL-XL) 100 MG 24 hr tablet Take 1 tablet (100 mg total) by mouth daily. Take with or immediately following a meal. 07/23/18   Barnetta Chapel, PA-C  traMADol (ULTRAM) 50 MG tablet Take 1-2 tablets (50-100 mg total) by mouth every 6 (six) hours as needed for moderate pain or severe pain. 07/18/18   Rayburn, Alphonsus Sias, PA-C    Family History Family History  Problem Relation Age of Onset   Stroke Father    Hypertension Father    Hyperlipidemia Father    Early death Mother    Colitis Sister    Appendicitis Brother     Social History Social History   Tobacco Use   Smoking status: Never Smoker   Smokeless tobacco: Never Used  Substance Use Topics   Alcohol use: Yes    Comment: VERY RARELY   Drug use: No     Allergies   Patient has no known allergies.   Review of Systems Review of Systems  Unable to perform ROS: Patient nonverbal (Patient has expressive aphasia and cannot talk at this time)  Constitutional: Positive for fatigue. Negative for chills, diaphoresis and fever.    Eyes: Negative for visual disturbance.  Respiratory: Negative for cough, chest tightness and shortness of breath.   Cardiovascular: Negative for chest pain.  Gastrointestinal: Negative for abdominal pain, constipation, diarrhea, nausea and vomiting.  Genitourinary: Negative for flank pain.  Musculoskeletal: Negative for back pain, neck pain and neck stiffness.  Neurological: Positive for facial asymmetry, speech difficulty and headaches. Negative for dizziness, weakness, light-headedness and numbness.  Psychiatric/Behavioral: Negative for agitation.  All other systems reviewed and are negative.    Physical Exam Updated Vital Signs BP (!) 102/54    Pulse (!) 56    Temp 97.9 F (36.6 C) (Oral)    Resp (!) 25    Wt 95.6 kg    SpO2 100%    BMI 31.12 kg/m   Physical Exam Vitals signs and nursing note reviewed.  Constitutional:      General: He is not in acute distress.    Appearance: He is well-developed. He is not ill-appearing, toxic-appearing or diaphoretic.  HENT:     Head: Normocephalic and atraumatic.     Nose: No congestion or rhinorrhea.     Mouth/Throat:     Pharynx: No oropharyngeal exudate or posterior oropharyngeal erythema.  Eyes:     Conjunctiva/sclera: Conjunctivae normal.     Pupils: Pupils are equal, round, and reactive to light.  Neck:     Musculoskeletal: Neck supple. No muscular tenderness.  Cardiovascular:     Rate and Rhythm: Normal rate.     Pulses: Normal pulses.     Heart sounds: No murmur.  Pulmonary:     Effort: Pulmonary effort is normal. No respiratory distress.     Breath sounds: Normal breath sounds.  Abdominal:     General: Abdomen is flat.     Palpations: Abdomen is soft.     Tenderness: There is no abdominal tenderness. There is no right CVA tenderness or left CVA tenderness.  Musculoskeletal:        General: No tenderness.  Skin:    General: Skin is warm and dry.  Neurological:     Mental Status: He is alert.     GCS: GCS eye subscore  is 4. GCS verbal subscore is 5. GCS motor subscore is 6.     Cranial Nerves: Dysarthria and facial asymmetry present.     Sensory: No sensory deficit.     Motor: No tremor, atrophy, abnormal muscle tone or seizure activity.     Comments: Patient has right-sided facial droop.  Other strength was symmetric on my exam.  No numbness appreciated.  Patient has a aphasia.  Psychiatric:        Mood and Affect: Mood normal.      ED Treatments / Results  Labs (all labs ordered are listed,  but only abnormal results are displayed) Labs Reviewed  CBC - Abnormal; Notable for the following components:      Result Value   WBC 13.0 (*)    All other components within normal limits  DIFFERENTIAL - Abnormal; Notable for the following components:   Neutro Abs 10.6 (*)    Monocytes Absolute 1.1 (*)    Abs Immature Granulocytes 0.12 (*)    All other components within normal limits  COMPREHENSIVE METABOLIC PANEL - Abnormal; Notable for the following components:   Sodium 133 (*)    CO2 13 (*)    Glucose, Bld 184 (*)    Creatinine, Ser 1.25 (*)    Total Bilirubin 1.8 (*)    Anion gap 20 (*)    All other components within normal limits  I-STAT CHEM 8, ED - Abnormal; Notable for the following components:   Sodium 131 (*)    Glucose, Bld 181 (*)    Calcium, Ion 1.10 (*)    TCO2 14 (*)    All other components within normal limits  CBG MONITORING, ED - Abnormal; Notable for the following components:   Glucose-Capillary 163 (*)    All other components within normal limits  POCT I-STAT 7, (LYTES, BLD GAS, ICA,H+H) - Abnormal; Notable for the following components:   pCO2 arterial 23.7 (*)    Bicarbonate 13.4 (*)    TCO2 14 (*)    Acid-base deficit 10.0 (*)    Sodium 132 (*)    HCT 36.0 (*)    Hemoglobin 12.2 (*)    All other components within normal limits  SARS CORONAVIRUS 2 (HOSPITAL ORDER, PERFORMED IN [facility])  PROTIME-INR  APTT  GLUCOSE, CAPILLARY  GLUCOSE, CAPILLARY  BASIC  METABOLIC PANEL  CBC WITH DIFFERENTIAL/PLATELET  SODIUM, URINE, RANDOM  CREATININE, URINE, RANDOM  URINALYSIS, COMPLETE (UACMP) WITH MICROSCOPIC  BLOOD GAS, ARTERIAL    EKG None  Radiology Ct Angio Head W Or Wo Contrast  Result Date: 07/28/2018 CLINICAL DATA:  68 y/o M; left-sided facial droop. Progressive somnolence. EXAM: CT ANGIOGRAPHY HEAD AND NECK TECHNIQUE: Multidetector CT imaging of the head and neck was performed using the standard protocol during bolus administration of intravenous contrast. Multiplanar CT image reconstructions and MIPs were obtained to evaluate the vascular anatomy. Carotid stenosis measurements (when applicable) are obtained utilizing NASCET criteria, using the distal internal carotid diameter as the denominator. CONTRAST:  75mL OMNIPAQUE IOHEXOL 350 MG/ML SOLN COMPARISON:  07/28/2018 CT of the head. FINDINGS: CTA NECK FINDINGS Aortic arch: Standard branching. Imaged portion shows no evidence of aneurysm or dissection. No significant stenosis of the major arch vessel origins. Mild aortic calcific atherosclerosis. Right carotid system: No evidence of dissection, stenosis (50% or greater) or occlusion. Mild non stenotic calcific atherosclerosis of carotid bifurcation. Left carotid system: No evidence of dissection, stenosis (50% or greater) or occlusion. Mild non stenotic calcific atherosclerosis of carotid bifurcation. Vertebral arteries: Left V1-V2 proximal V3 are occluded. The left distal V3 and V4 are diffusely irregular with attenuated enhancement and reversal of flow from the vertebrobasilar junction. There appears to be a cauda above the left C2 foramen transversarium (series 7, image 86). Widely patent and dominant right vertebral artery. Skeleton: No acute finding. Other neck: Negative. Upper chest: Right lung apex calcified granuloma. Review of the MIP images confirms the above findings CTA HEAD FINDINGS Anterior circulation: No significant stenosis, proximal  occlusion, aneurysm, or vascular malformation. Posterior circulation: Left V1-V2 and proximal V3 are occluded, no visible dissection flap  or wall hematoma. The left distal V3 and V4 are diffusely irregular with attenuated enhancement and reversal of flow from the vertebrobasilar junction. Widely patent and dominant right vertebral artery. There appears to be a cauda above the left C2 foramen transversarium (series 7, image 86). Tandem segments of mild-to-moderate stenosis in the left P2 and P3 segments. Large PICA and SCA noted bilaterally. Otherwise no significant stenosis, proximal occlusion, aneurysm, or vascular malformation in the posterior circulation. Venous sinuses: As permitted by contrast timing, patent. Anatomic variants: Fetal left PCA. No right posterior communicating artery identified, likely hypoplastic or absent. Probable diminutive anterior communicating artery. Review of the MIP images confirms the above findings IMPRESSION: 1. Nonspecific left V1-V2 and proximal V3 occlusion. There appears to be a cutoff above the left C2 foramen transversarium suggesting a recent thromboembolic occlusion or dissection. 2. Left distal V3 and V4 are diffusely irregular with attenuated enhancement and reversal of flow from vertebrobasilar junction. 3. Patent carotid systems and dominant right vertebral artery without dissection, aneurysm, or hemodynamically significant stenosis utilizing NASCET criteria. 4. Mild calcific atherosclerosis of aortic arch and carotid bifurcations. CTA HEAD:. 1. Patent Circle of Willis. No intracranial large vessel occlusion, aneurysm, or vascular malformation identified. 2. Tandem segments of mild-to-moderate stenosis in the left P2 and P3 segments. 3. Left V4 is diffusely irregular and attenuated with reversal of flow from the vertebrobasilar junction. 4. Fetal left PCA. These results were called by telephone at the time of interpretation on 07/28/2018 at 7:15 pm to Dr. Milon Dikes ,  who verbally acknowledged these results. Electronically Signed   By: Mitzi Hansen M.D.   On: 07/28/2018 19:18   Ct Angio Neck W Or Wo Contrast  Result Date: 07/28/2018 CLINICAL DATA:  68 y/o M; left-sided facial droop. Progressive somnolence. EXAM: CT ANGIOGRAPHY HEAD AND NECK TECHNIQUE: Multidetector CT imaging of the head and neck was performed using the standard protocol during bolus administration of intravenous contrast. Multiplanar CT image reconstructions and MIPs were obtained to evaluate the vascular anatomy. Carotid stenosis measurements (when applicable) are obtained utilizing NASCET criteria, using the distal internal carotid diameter as the denominator. CONTRAST:  75mL OMNIPAQUE IOHEXOL 350 MG/ML SOLN COMPARISON:  07/28/2018 CT of the head. FINDINGS: CTA NECK FINDINGS Aortic arch: Standard branching. Imaged portion shows no evidence of aneurysm or dissection. No significant stenosis of the major arch vessel origins. Mild aortic calcific atherosclerosis. Right carotid system: No evidence of dissection, stenosis (50% or greater) or occlusion. Mild non stenotic calcific atherosclerosis of carotid bifurcation. Left carotid system: No evidence of dissection, stenosis (50% or greater) or occlusion. Mild non stenotic calcific atherosclerosis of carotid bifurcation. Vertebral arteries: Left V1-V2 proximal V3 are occluded. The left distal V3 and V4 are diffusely irregular with attenuated enhancement and reversal of flow from the vertebrobasilar junction. There appears to be a cauda above the left C2 foramen transversarium (series 7, image 86). Widely patent and dominant right vertebral artery. Skeleton: No acute finding. Other neck: Negative. Upper chest: Right lung apex calcified granuloma. Review of the MIP images confirms the above findings CTA HEAD FINDINGS Anterior circulation: No significant stenosis, proximal occlusion, aneurysm, or vascular malformation. Posterior circulation: Left V1-V2  and proximal V3 are occluded, no visible dissection flap or wall hematoma. The left distal V3 and V4 are diffusely irregular with attenuated enhancement and reversal of flow from the vertebrobasilar junction. Widely patent and dominant right vertebral artery. There appears to be a cauda above the left C2 foramen transversarium (series 7, image 86).  Tandem segments of mild-to-moderate stenosis in the left P2 and P3 segments. Large PICA and SCA noted bilaterally. Otherwise no significant stenosis, proximal occlusion, aneurysm, or vascular malformation in the posterior circulation. Venous sinuses: As permitted by contrast timing, patent. Anatomic variants: Fetal left PCA. No right posterior communicating artery identified, likely hypoplastic or absent. Probable diminutive anterior communicating artery. Review of the MIP images confirms the above findings IMPRESSION: 1. Nonspecific left V1-V2 and proximal V3 occlusion. There appears to be a cutoff above the left C2 foramen transversarium suggesting a recent thromboembolic occlusion or dissection. 2. Left distal V3 and V4 are diffusely irregular with attenuated enhancement and reversal of flow from vertebrobasilar junction. 3. Patent carotid systems and dominant right vertebral artery without dissection, aneurysm, or hemodynamically significant stenosis utilizing NASCET criteria. 4. Mild calcific atherosclerosis of aortic arch and carotid bifurcations. CTA HEAD:. 1. Patent Circle of Willis. No intracranial large vessel occlusion, aneurysm, or vascular malformation identified. 2. Tandem segments of mild-to-moderate stenosis in the left P2 and P3 segments. 3. Left V4 is diffusely irregular and attenuated with reversal of flow from the vertebrobasilar junction. 4. Fetal left PCA. These results were called by telephone at the time of interpretation on 07/28/2018 at 7:15 pm to Dr. Milon Dikes , who verbally acknowledged these results. Electronically Signed   By: Mitzi Hansen M.D.   On: 07/28/2018 19:18   Ct Head Code Stroke Wo Contrast  Result Date: 07/28/2018 CLINICAL DATA:  Code stroke. LEFT-sided facial droop. Progressive somnolence. Last seen normal 1745 hours. EXAM: CT HEAD WITHOUT CONTRAST TECHNIQUE: Contiguous axial images were obtained from the base of the skull through the vertex without intravenous contrast. COMPARISON:  Multiple priors, most recent 07/17/2018 FINDINGS: Brain: No acute stroke or parenchymal hemorrhage. No intracranial intra-axial mass lesion. Developing hydrocephalus, biventricular diameter now 52 mm, as compared with 46 mm on 05/07. LEFT frontoparietal subdural collection is also increased, now 8 mm as compared with 4-5 mm previous. The attenuation of the subdural collection is now becoming more isodense, with some slight hyperdense residual collection. Intraventricular blood is improving but still layering. Subarachnoid blood is resolving. Vascular: No hyperdense vessel or unexpected calcification. Skull: Normal. Negative for fracture or focal lesion. Sinuses/Orbits: Negative Other: None ASPECTS ([place] Stroke Program Early CT Score) - Ganglionic level infarction (caudate, lentiform nuclei, internal capsule, insula, M1-M3 cortex): 7 - Supraganglionic infarction (M4-M6 cortex): 3 Total score (0-10 with 10 being normal): 10 IMPRESSION: 1. 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 Biventricular diameter is 52 mm, which is significant. Resolving subarachnoid blood. 2. Increasing size of LEFT frontoparietal subdural collection, now 8 mm. 3. ASPECTS is 10. * These results were called by telephone at the time of interpretation on 07/28/2018 at 6:50 pm to Dr. Wilford Corner , who verbally acknowledged these results. Electronically Signed   By: Elsie Stain M.D.   On: 07/28/2018 18:56    Procedures Procedures (including critical care time)  Medications Ordered in ED Medications  sodium  chloride flush (NS) 0.9 % injection 3 mL (has no administration in time range)  iohexol (OMNIPAQUE) 350 MG/ML injection 75 mL (75 mLs Intravenous Contrast Given 07/28/18 1849)     Initial Impression / Assessment and Plan / ED Course  I have reviewed the triage vital signs and the nursing notes.  Pertinent labs & imaging results that were available during my care of the patient were reviewed by me and considered in my medical decision making (see chart for details).  Shari HeritageDonald G Nance is a 68 y.o. male with a past medical history significant for recent traumatic subdural hematoma, diabetes, hypertension, and recently diagnosed with atrial fibrillation unable to be on anticoagulation due to intracranial hemorrhage who presents for 3 days of worsening somnolence, fatigue, and new right facial droop and speech abnormality.  Patient is coming by family reports that 5:45 PM today, patient had sudden onset of garbled speech, aphasia, and right facial droop.  This is new.  Patient has had worsening somnolence and sleepiness and fatigue for the last several days.  She reports he has not had any cough, fevers, chills, chest pain, shortness of breath, or other complaints.  Patient has had some headache since his intracranial hemorrhage several weeks ago.  Patient was made a code stroke upon arrival and I saw the patient after he returned from CT.  On my exam, patient is moving all extremities.  Patient does have subtle right facial droop and a aphasia.  Lungs clear chest nontender.  Abdomen was nontender.  Patient is not a candidate for TPA given the recent subdural hematoma.  CTA head showed concern for possible left vertebral thrombus or dissection.  Neurology is concerned that this is developing hydrocephalus from the recent hematoma.  Neurology recommends neurosurgery consultation and admission for monitoring and EEG management.  Neurosurgery was called who will see patient.  They recommended  making the patient n.p.o. at midnight if procedure does need to be done.  Patient will be admitted for further management.   Final Clinical Impressions(s) / ED Diagnoses   Final diagnoses:  Somnolence  Hydrocephalus, unspecified type (HCC)  Aphasia     Clinical Impression: 1. Somnolence   2. Hydrocephalus, unspecified type (HCC)   3. Aphasia     Disposition: Admit  This note was prepared with assistance of Dragon voice recognition software. Occasional wrong-word or sound-a-like substitutions may have occurred due to the inherent limitations of voice recognition software.     Teddy Rebstock, Canary Brimhristopher J, MD 07/29/18 0008    Veva Grimley, Canary Brimhristopher J, MD 07/29/18 [phone]

## 2018-07-29 ENCOUNTER — Observation Stay (HOSPITAL_COMMUNITY): Payer: Medicare HMO

## 2018-07-29 ENCOUNTER — Inpatient Hospital Stay (HOSPITAL_COMMUNITY): Payer: Medicare HMO

## 2018-07-29 DIAGNOSIS — E785 Hyperlipidemia, unspecified: Secondary | ICD-10-CM | POA: Diagnosis present

## 2018-07-29 DIAGNOSIS — G919 Hydrocephalus, unspecified: Secondary | ICD-10-CM | POA: Diagnosis present

## 2018-07-29 DIAGNOSIS — R29810 Facial weakness: Secondary | ICD-10-CM | POA: Diagnosis present

## 2018-07-29 DIAGNOSIS — Z79899 Other long term (current) drug therapy: Secondary | ICD-10-CM | POA: Diagnosis not present

## 2018-07-29 DIAGNOSIS — Z87820 Personal history of traumatic brain injury: Secondary | ICD-10-CM | POA: Diagnosis not present

## 2018-07-29 DIAGNOSIS — G911 Obstructive hydrocephalus: Secondary | ICD-10-CM | POA: Diagnosis not present

## 2018-07-29 DIAGNOSIS — R471 Dysarthria and anarthria: Secondary | ICD-10-CM | POA: Diagnosis present

## 2018-07-29 DIAGNOSIS — I10 Essential (primary) hypertension: Secondary | ICD-10-CM | POA: Diagnosis present

## 2018-07-29 DIAGNOSIS — Z7982 Long term (current) use of aspirin: Secondary | ICD-10-CM | POA: Diagnosis not present

## 2018-07-29 DIAGNOSIS — E1169 Type 2 diabetes mellitus with other specified complication: Secondary | ICD-10-CM | POA: Diagnosis not present

## 2018-07-29 DIAGNOSIS — R001 Bradycardia, unspecified: Secondary | ICD-10-CM | POA: Diagnosis not present

## 2018-07-29 DIAGNOSIS — Z823 Family history of stroke: Secondary | ICD-10-CM | POA: Diagnosis not present

## 2018-07-29 DIAGNOSIS — S065X9A Traumatic subdural hemorrhage with loss of consciousness of unspecified duration, initial encounter: Secondary | ICD-10-CM | POA: Diagnosis not present

## 2018-07-29 DIAGNOSIS — S065X0S Traumatic subdural hemorrhage without loss of consciousness, sequela: Secondary | ICD-10-CM | POA: Diagnosis not present

## 2018-07-29 DIAGNOSIS — Z7901 Long term (current) use of anticoagulants: Secondary | ICD-10-CM | POA: Diagnosis not present

## 2018-07-29 DIAGNOSIS — E46 Unspecified protein-calorie malnutrition: Secondary | ICD-10-CM | POA: Diagnosis not present

## 2018-07-29 DIAGNOSIS — R4701 Aphasia: Secondary | ICD-10-CM

## 2018-07-29 DIAGNOSIS — D72819 Decreased white blood cell count, unspecified: Secondary | ICD-10-CM | POA: Diagnosis not present

## 2018-07-29 DIAGNOSIS — E119 Type 2 diabetes mellitus without complications: Secondary | ICD-10-CM | POA: Diagnosis present

## 2018-07-29 DIAGNOSIS — R2689 Other abnormalities of gait and mobility: Secondary | ICD-10-CM | POA: Diagnosis not present

## 2018-07-29 DIAGNOSIS — W19XXXS Unspecified fall, sequela: Secondary | ICD-10-CM | POA: Diagnosis present

## 2018-07-29 DIAGNOSIS — S069X9A Unspecified intracranial injury with loss of consciousness of unspecified duration, initial encounter: Secondary | ICD-10-CM | POA: Diagnosis not present

## 2018-07-29 DIAGNOSIS — I480 Paroxysmal atrial fibrillation: Secondary | ICD-10-CM | POA: Diagnosis present

## 2018-07-29 DIAGNOSIS — E669 Obesity, unspecified: Secondary | ICD-10-CM | POA: Diagnosis not present

## 2018-07-29 DIAGNOSIS — Z7984 Long term (current) use of oral hypoglycemic drugs: Secondary | ICD-10-CM | POA: Diagnosis not present

## 2018-07-29 DIAGNOSIS — Z955 Presence of coronary angioplasty implant and graft: Secondary | ICD-10-CM | POA: Diagnosis not present

## 2018-07-29 DIAGNOSIS — R29706 NIHSS score 6: Secondary | ICD-10-CM | POA: Diagnosis present

## 2018-07-29 DIAGNOSIS — D708 Other neutropenia: Secondary | ICD-10-CM | POA: Diagnosis not present

## 2018-07-29 DIAGNOSIS — I2510 Atherosclerotic heart disease of native coronary artery without angina pectoris: Secondary | ICD-10-CM | POA: Diagnosis present

## 2018-07-29 DIAGNOSIS — G92 Toxic encephalopathy: Secondary | ICD-10-CM | POA: Diagnosis not present

## 2018-07-29 DIAGNOSIS — N433 Hydrocele, unspecified: Secondary | ICD-10-CM | POA: Diagnosis present

## 2018-07-29 DIAGNOSIS — G9341 Metabolic encephalopathy: Secondary | ICD-10-CM | POA: Diagnosis present

## 2018-07-29 DIAGNOSIS — N5089 Other specified disorders of the male genital organs: Secondary | ICD-10-CM | POA: Diagnosis present

## 2018-07-29 DIAGNOSIS — G910 Communicating hydrocephalus: Secondary | ICD-10-CM | POA: Diagnosis present

## 2018-07-29 DIAGNOSIS — I959 Hypotension, unspecified: Secondary | ICD-10-CM | POA: Diagnosis present

## 2018-07-29 DIAGNOSIS — R400 Somnolence: Secondary | ICD-10-CM | POA: Diagnosis not present

## 2018-07-29 DIAGNOSIS — R339 Retention of urine, unspecified: Secondary | ICD-10-CM | POA: Diagnosis not present

## 2018-07-29 DIAGNOSIS — Z1159 Encounter for screening for other viral diseases: Secondary | ICD-10-CM | POA: Diagnosis not present

## 2018-07-29 DIAGNOSIS — Z8249 Family history of ischemic heart disease and other diseases of the circulatory system: Secondary | ICD-10-CM | POA: Diagnosis not present

## 2018-07-29 DIAGNOSIS — R569 Unspecified convulsions: Secondary | ICD-10-CM | POA: Diagnosis present

## 2018-07-29 LAB — URINALYSIS, ROUTINE W REFLEX MICROSCOPIC
Bacteria, UA: NONE SEEN
Bilirubin Urine: NEGATIVE
Glucose, UA: >=500 mg/dL — AB
Hgb urine dipstick: NEGATIVE
Ketones, ur: 80 mg/dL — AB
Leukocytes,Ua: NEGATIVE
Nitrite: NEGATIVE
Protein, ur: 30 mg/dL — AB
Specific Gravity, Urine: 1.043 — ABNORMAL HIGH (ref 1.005–1.030)
pH: 5 (ref 5.0–8.0)

## 2018-07-29 LAB — CBC WITH DIFFERENTIAL/PLATELET
Abs Immature Granulocytes: 0.04 10*3/uL (ref 0.00–0.07)
Basophils Absolute: 0 10*3/uL (ref 0.0–0.1)
Basophils Relative: 1 %
Eosinophils Absolute: 0.1 10*3/uL (ref 0.0–0.5)
Eosinophils Relative: 1 %
HCT: 40.5 % (ref 39.0–52.0)
Hemoglobin: 13.3 g/dL (ref 13.0–17.0)
Immature Granulocytes: 1 %
Lymphocytes Relative: 13 %
Lymphs Abs: 0.9 10*3/uL (ref 0.7–4.0)
MCH: 29.3 pg (ref 26.0–34.0)
MCHC: 32.8 g/dL (ref 30.0–36.0)
MCV: 89.2 fL (ref 80.0–100.0)
Monocytes Absolute: 0.9 10*3/uL (ref 0.1–1.0)
Monocytes Relative: 13 %
Neutro Abs: 5.2 10*3/uL (ref 1.7–7.7)
Neutrophils Relative %: 71 %
Platelets: 269 10*3/uL (ref 150–400)
RBC: 4.54 MIL/uL (ref 4.22–5.81)
RDW: 14.3 % (ref 11.5–15.5)
WBC: 7.2 10*3/uL (ref 4.0–10.5)
nRBC: 0 % (ref 0.0–0.2)

## 2018-07-29 LAB — GLUCOSE, CAPILLARY
Glucose-Capillary: 107 mg/dL — ABNORMAL HIGH (ref 70–99)
Glucose-Capillary: 117 mg/dL — ABNORMAL HIGH (ref 70–99)
Glucose-Capillary: 186 mg/dL — ABNORMAL HIGH (ref 70–99)
Glucose-Capillary: 62 mg/dL — ABNORMAL LOW (ref 70–99)
Glucose-Capillary: 67 mg/dL — ABNORMAL LOW (ref 70–99)
Glucose-Capillary: 82 mg/dL (ref 70–99)
Glucose-Capillary: 86 mg/dL (ref 70–99)
Glucose-Capillary: 98 mg/dL (ref 70–99)

## 2018-07-29 LAB — BASIC METABOLIC PANEL
Anion gap: 15 (ref 5–15)
BUN: 20 mg/dL (ref 8–23)
CO2: 18 mmol/L — ABNORMAL LOW (ref 22–32)
Calcium: 8.9 mg/dL (ref 8.9–10.3)
Chloride: 102 mmol/L (ref 98–111)
Creatinine, Ser: 1.03 mg/dL (ref 0.61–1.24)
GFR calc Af Amer: >60 mL/min (ref >60)
GFR calc non Af Amer: >60 mL/min (ref >60)
Glucose, Bld: 70 mg/dL (ref 70–99)
Potassium: 4.4 mmol/L (ref 3.5–5.1)
Sodium: 135 mmol/L (ref 135–145)

## 2018-07-29 LAB — SODIUM, URINE, RANDOM: Sodium, Ur: 55 mmol/L

## 2018-07-29 LAB — CREATININE, URINE, RANDOM: Creatinine, Urine: 72.86 mg/dL

## 2018-07-29 IMAGING — MR MRI HEAD WITHOUT CONTRAST
9 of 10 series · 36 of 48 positions shown · non-contrast
Comparison: CT head yesterday.  CTA head neck yesterday.

CLINICAL DATA: Continued surveillance subdural. Paroxysmally
episodes of aphasia.

EXAM:
MRI HEAD WITHOUT CONTRAST
TECHNIQUE: Multiplanar, multiecho pulse sequences of the brain and surrounding
structures were obtained without intravenous contrast.

[Series 3: DWI · axial · 3.0mm · 1.09mm/px · z∈[-16,+135]mm · 9 of 104 slices shown (1 of 4)]
[im 1/104]
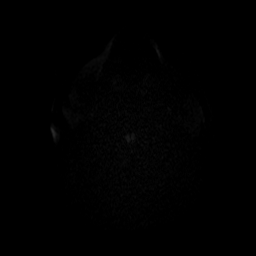
[im 13/104]
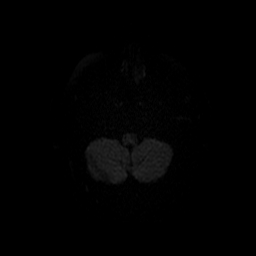
[im 26/104]
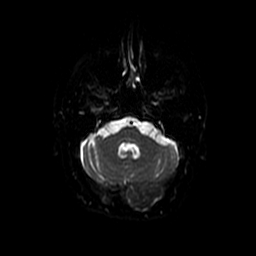
[im 39/104]
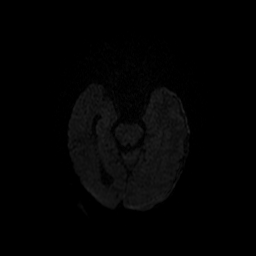
[im 52/104]
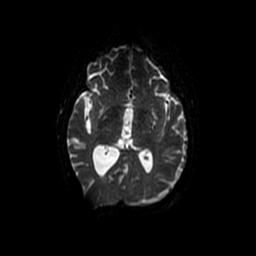
[im 65/104]
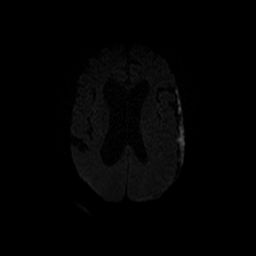
[im 78/104]
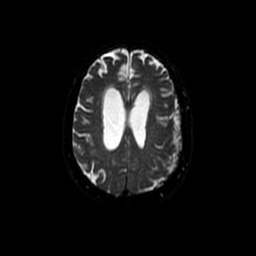
[im 91/104]
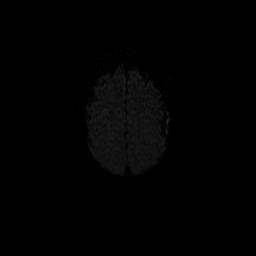
[im 104/104]
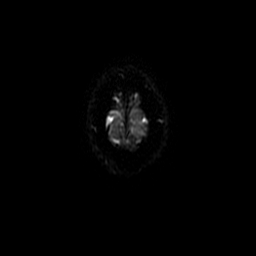

[Series 4: DWI · coronal · 5.0mm · 1.09mm/px · 7 of 72 slices shown (2 of 4)]
[im 1/72]
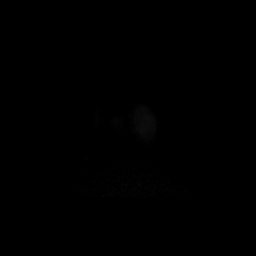
[im 12/72]
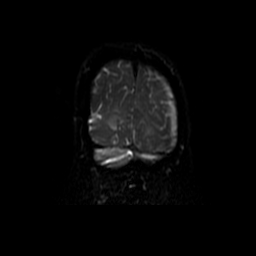
[im 24/72]
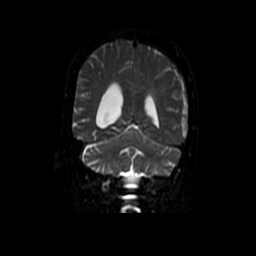
[im 36/72]
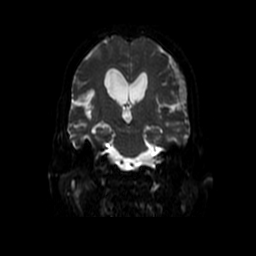
[im 48/72]
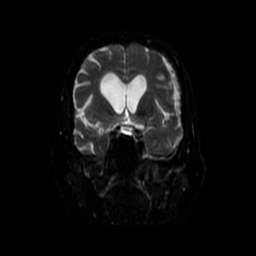
[im 60/72]
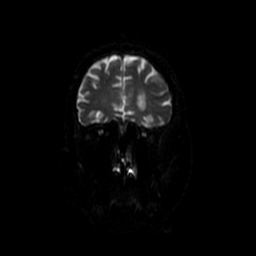
[im 72/72]
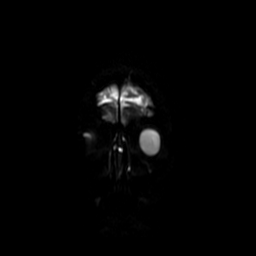

[Series 5: T1 · sagittal · 5.0mm · 0.47mm/px · 2 of 23 slices shown (1 of 2)]
[im 1/23]
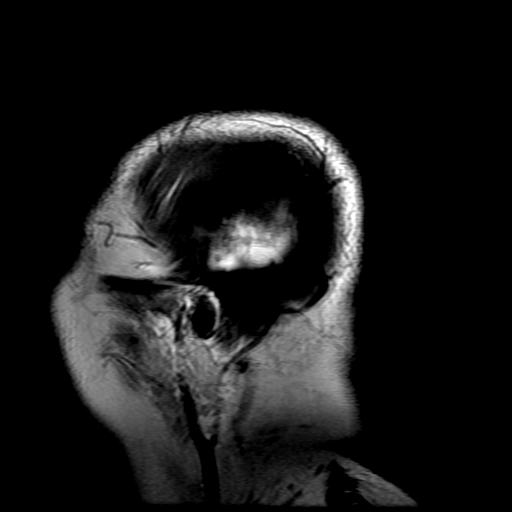
[im 23/23]
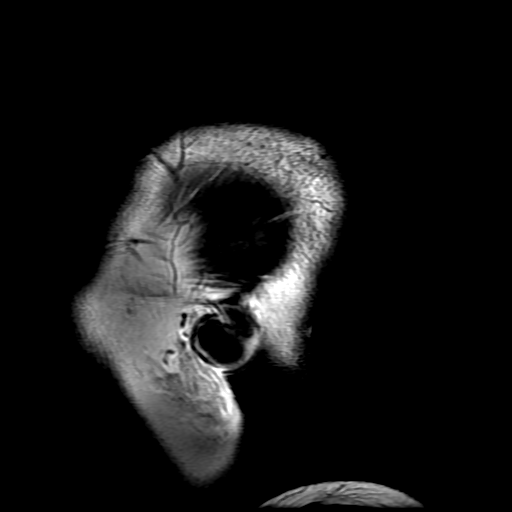

[Series 6: T2 · axial · 5.0mm · 0.43mm/px · z∈[-21,+126]mm · 3 of 26 slices shown (1 of 2)]
[im 1/26]
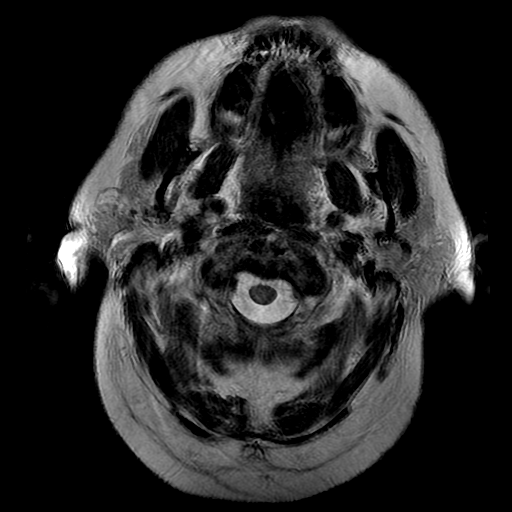
[im 13/26]
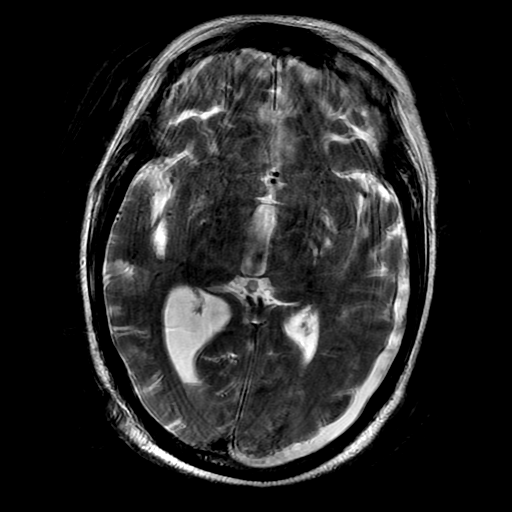
[im 26/26]
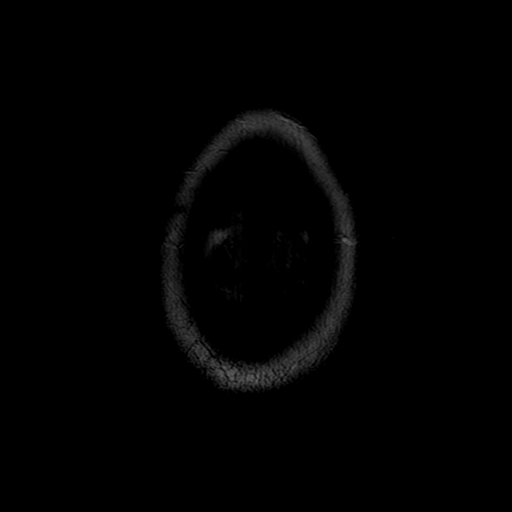

[Series 7: FLAIR · axial · 3.0mm · 0.43mm/px · z∈[-21,+126]mm · 3 of 26 slices shown]
[im 1/26]
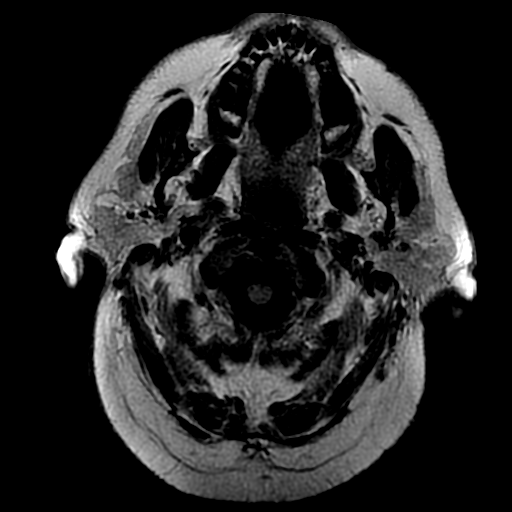
[im 13/26]
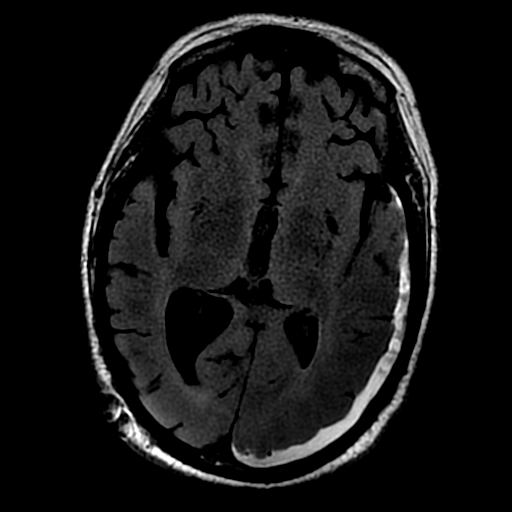
[im 26/26]
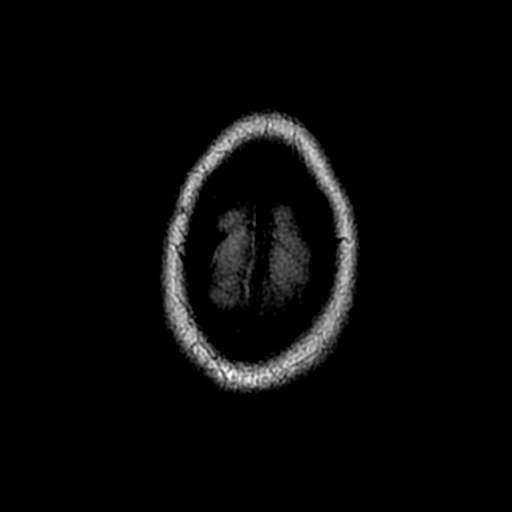

[Series 9: T1 · axial · 3.0mm · 0.47mm/px · 1 of 104 slices shown (2 of 2)]
[im 1/104]
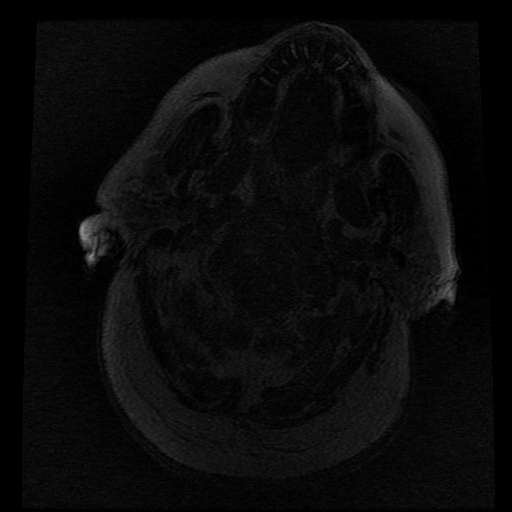

[Series 11: T2 · coronal · 5.0mm · 0.39mm/px · 3 of 28 slices shown (2 of 2)]
[im 1/28]
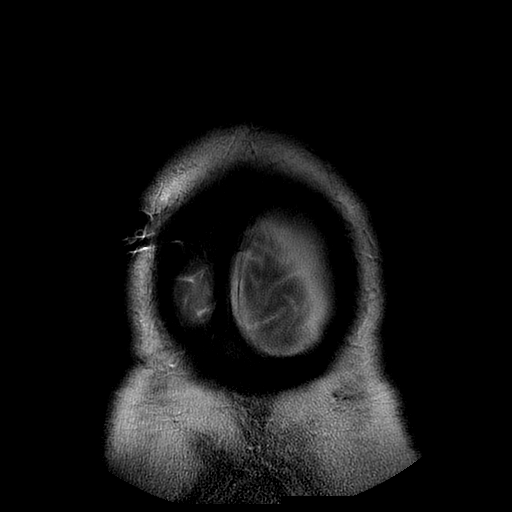
[im 14/28]
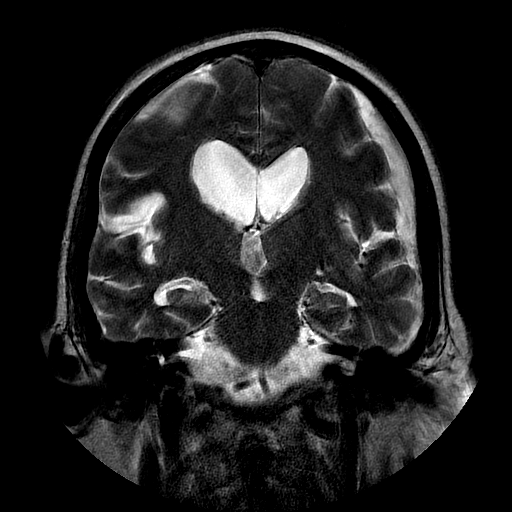
[im 28/28]
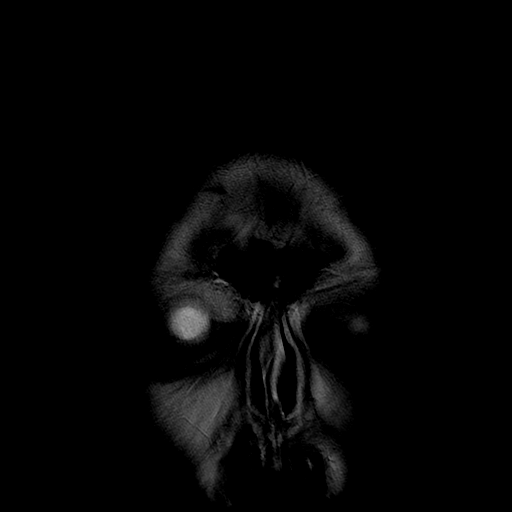

[Series 300: DWI · axial · 3.0mm · 1.09mm/px · z∈[-16,+135]mm · 5 of 52 slices shown (3 of 4)]
[im 1/52]
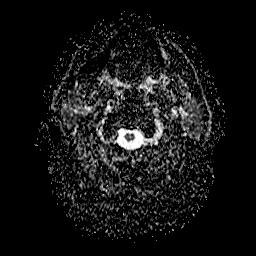
[im 13/52]
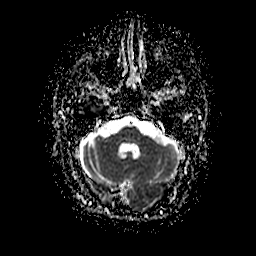
[im 26/52]
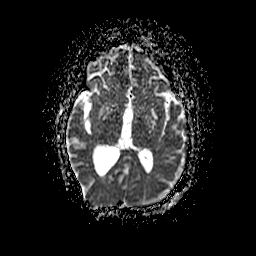
[im 39/52]
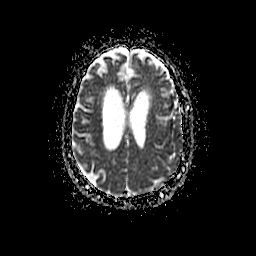
[im 52/52]
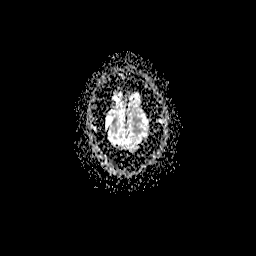

[Series 400: DWI · coronal · 5.0mm · 1.09mm/px · 3 of 36 slices shown (4 of 4)]
[im 1/36]
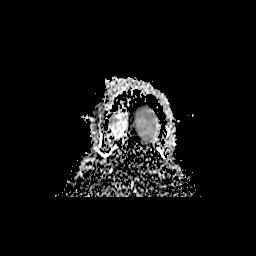
[im 18/36]
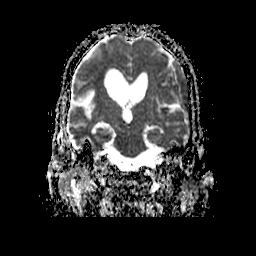
[im 36/36]
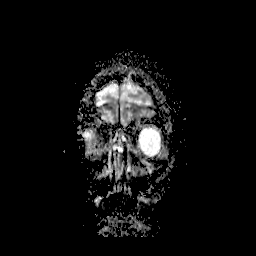

[36 of 48 positions shown; findings below may reference images not displayed]

FINDINGS: Brain: No acute infarction, parenchymal hemorrhage, or intra-axial
mass lesion.

Ventriculomegaly, suspected developing communicating hydrocephalus
is redemonstrated.

Extra-axial hematoma, LEFT subdural, heterogeneous signal intensity,
with moderate mass effect on the LEFT hemisphere, measures 10 mm
thickness on coronal series 11, image 11.

Vascular: Flow voids in the carotid, basilar, and RIGHT vertebral
arteries are preserved. Absent flow void/slow or reversed flow LEFT
vertebral; please see CTA report for additional detail regarding
flow reversal, and extracranial disease.

Skull and upper cervical spine: No fracture.

Sinuses/Orbits: Unremarkable.

Other: None.
IMPRESSION: Persistent ventriculomegaly, suspected developing communicating
hydrocephalus.

No evidence for posterior circulation or hemispheric infarct,
despite significant disease in both vertebrals on CTA.

10 mm thick LEFT subdural hematoma, with mass effect on the LEFT
hemisphere. No midline shift due to pre-existing atrophy, but
flattening of the cortical sulci is observed.

## 2018-07-29 IMAGING — CT CT HEAD WITHOUT CONTRAST
4 series · 16 of 47 positions shown, 18 images · non-contrast
Comparison: CT scan of [DATE].

CLINICAL DATA: Right facial droop.

EXAM:
CT HEAD WITHOUT CONTRAST
TECHNIQUE: Contiguous axial images were obtained from the base of the skull
through the vertex without intravenous contrast.

[Series 3: head without · axial · non-contrast · 0.48mm/px · z∈[-84,+51]mm · 7 of 37 slices shown, 9 images]
[im 5/37  brain]
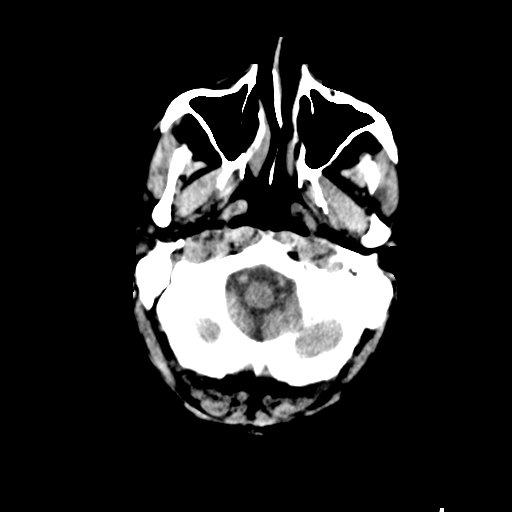
[im 5/37  bone]
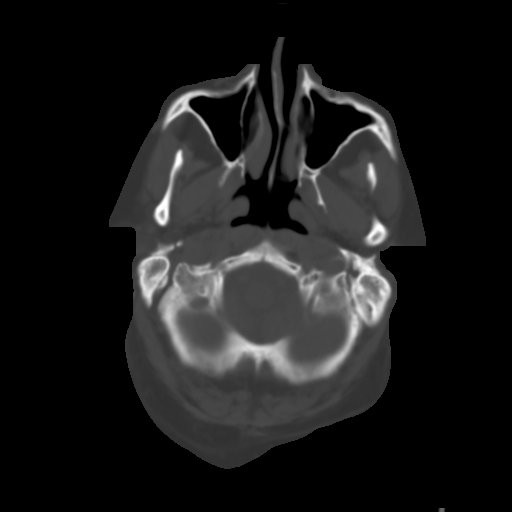
[im 10/37  brain]
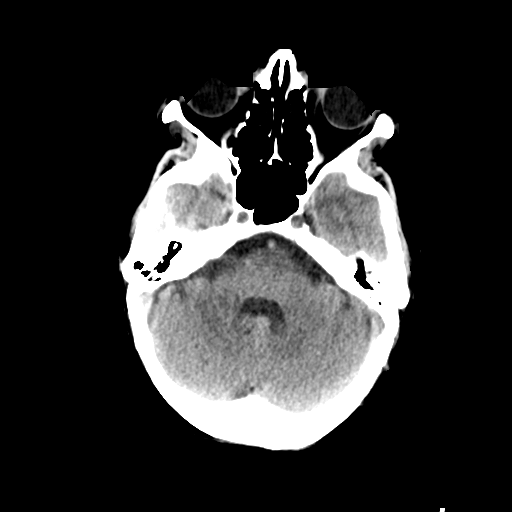
[im 14/37  brain]
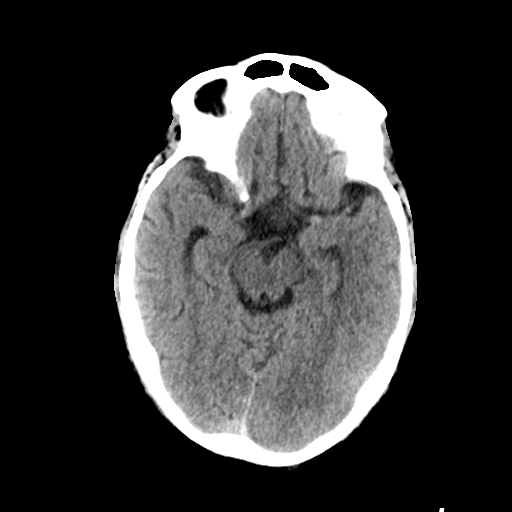
[im 19/37  brain]
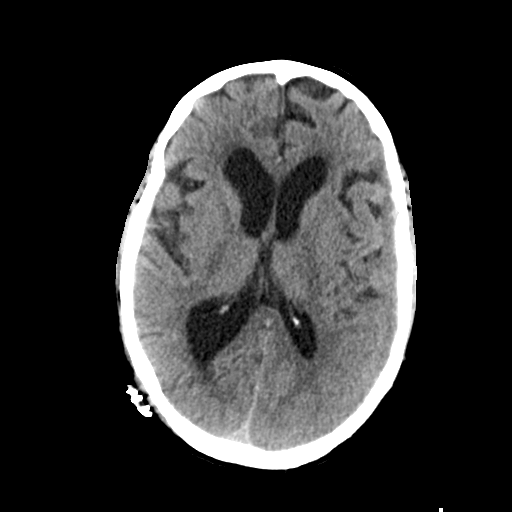
[im 23/37  brain]
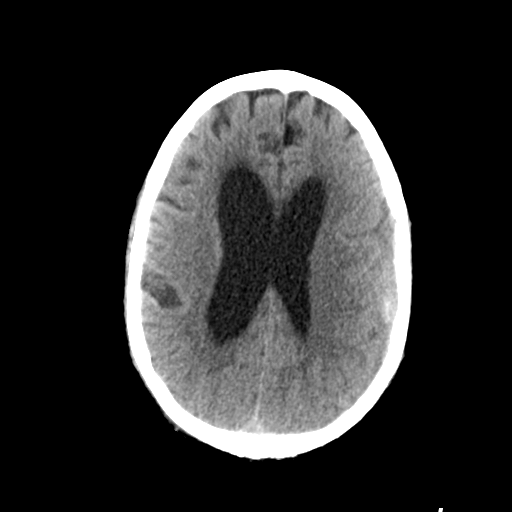
[im 23/37  bone]
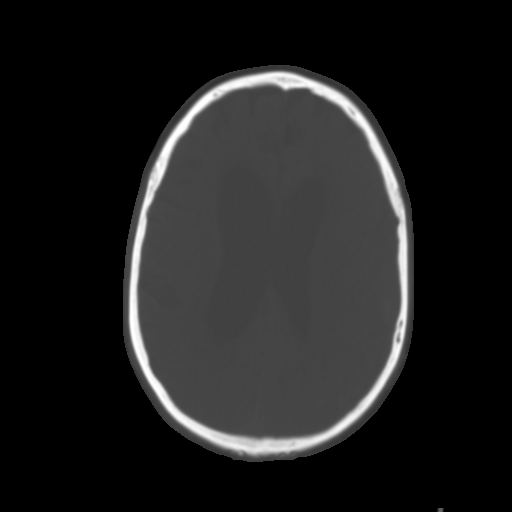
[im 28/37  brain]
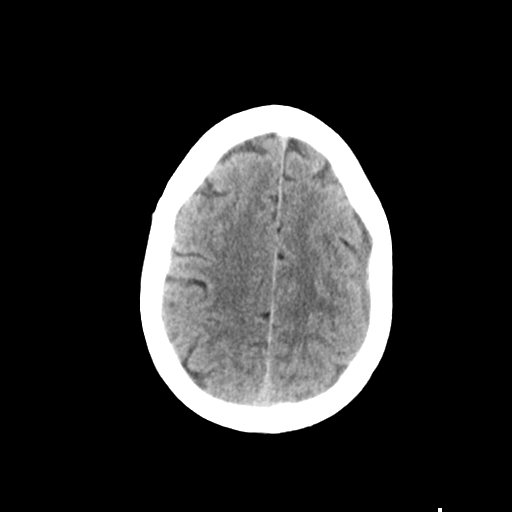
[im 32/37  brain]
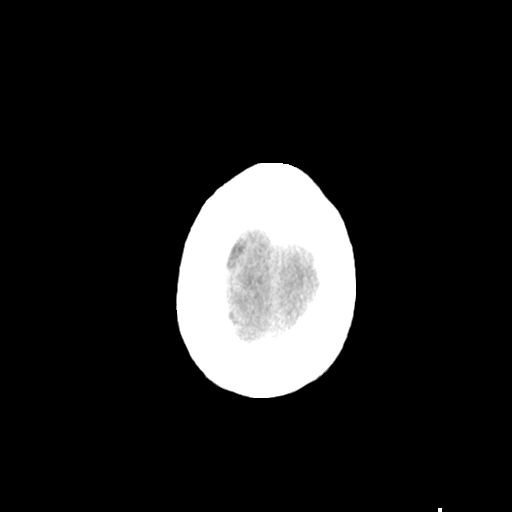

[Series 4: head bone · axial · 0.48mm/px · z∈[-86,-50]mm · 3 of 92 slices shown]
[im 10/92  bone]
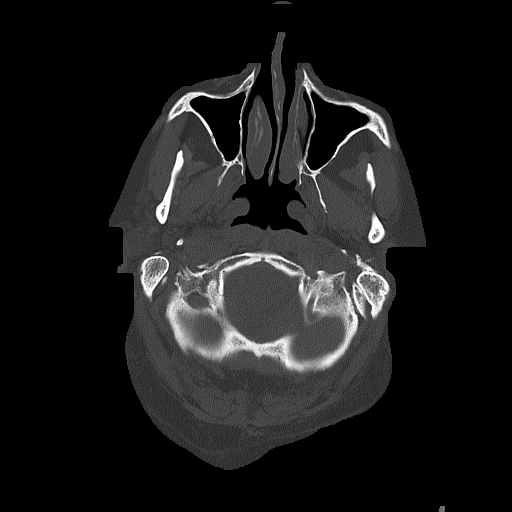
[im 19/92  bone]
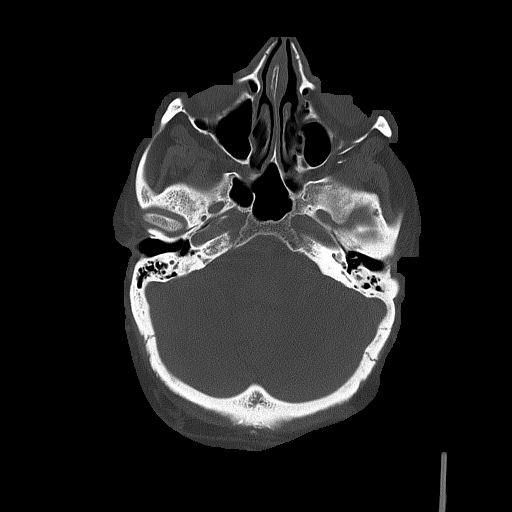
[im 28/92  bone]
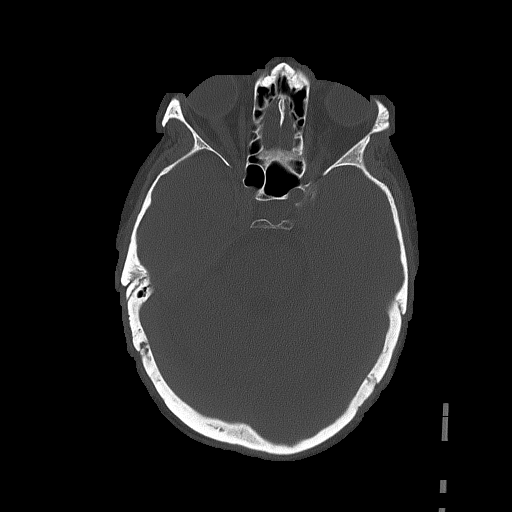

[Series 5: head without cor · coronal · non-contrast · 0.36mm/px · 3 of 70 slices shown]
[im 24/70  brain]
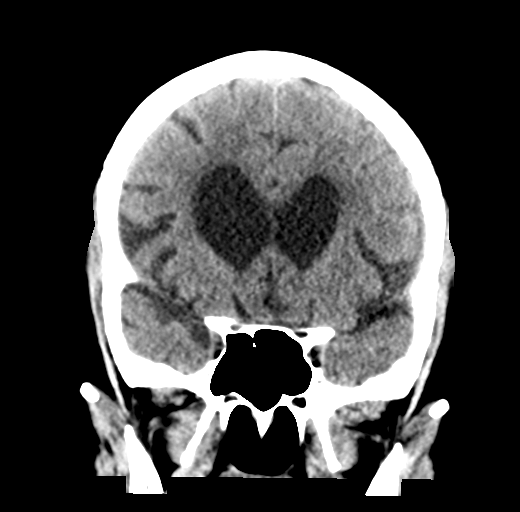
[im 31/70  brain]
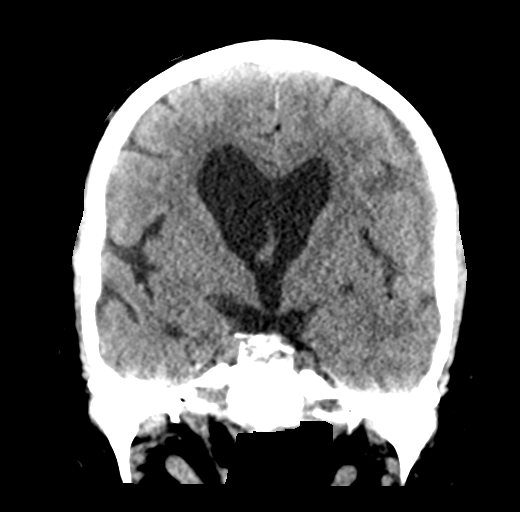
[im 39/70  brain]
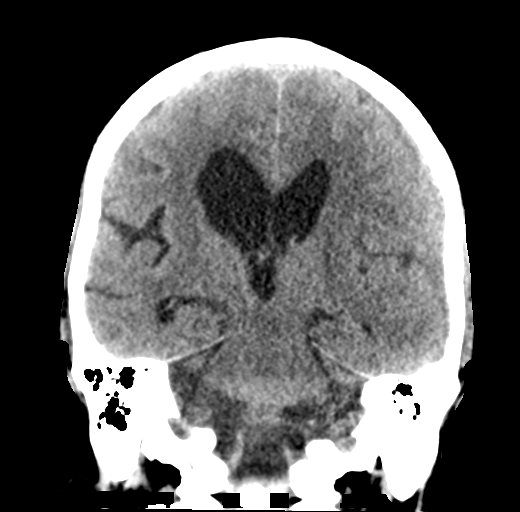

[Series 6: head without sag · sagittal · non-contrast · 0.34mm/px · 3 of 55 slices shown]
[im 19/55  brain]
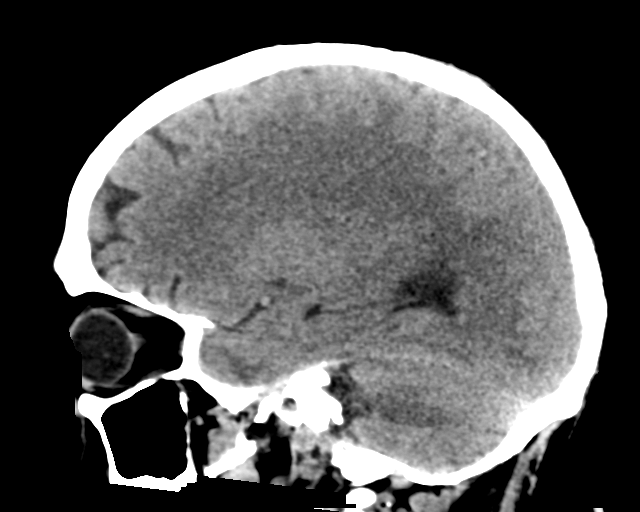
[im 28/55  brain]
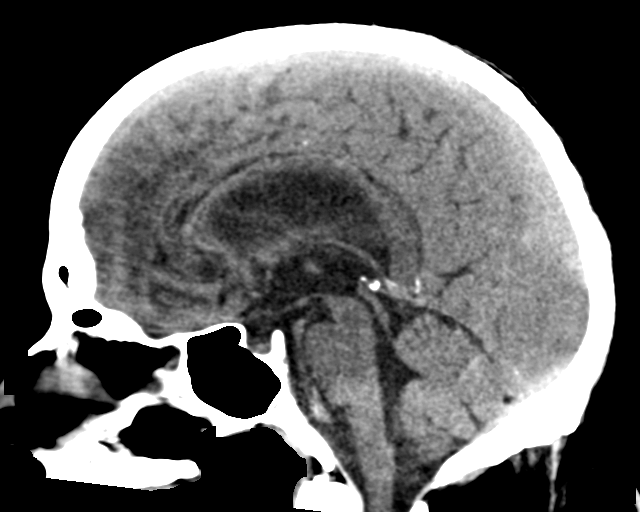
[im 37/55  brain]
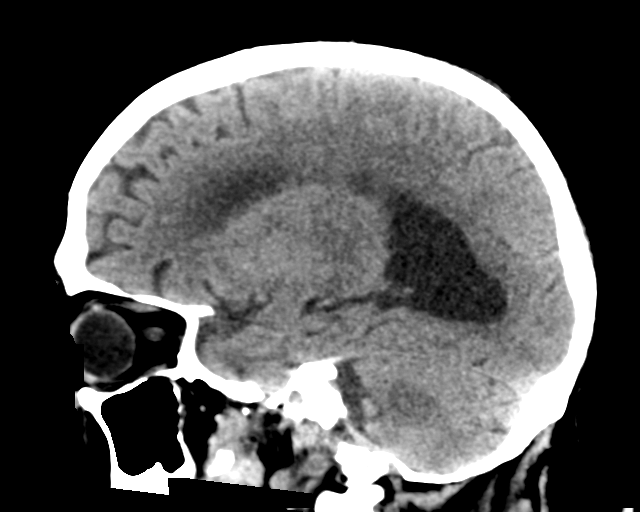

[16 of 47 positions shown; findings below may reference images not displayed]

FINDINGS: Brain: Grossly stable left frontal parietal subdural fluid
collection is noted of mixed density. Biventricular diameter is
measured at 52 mm which is not significantly changed compared to
prior exam. This is concerning for developing communicating
hydrocephalus. Stable amount of intraventricular hemorrhage is noted
in the right posterior horn. Stable appearance of subarachnoid
hemorrhage is seen in right occipital lobe. No new hemorrhage or
infarction is noted. Mild chronic ischemic white matter disease is
noted.

Vascular: No hyperdense vessel or unexpected calcification.

Skull: Normal. Negative for fracture or focal lesion.

Sinuses/Orbits: No acute finding.

Other: Staples are seen involving the right posterior parietal
scalp.
IMPRESSION: Grossly stable left frontal parietal subdural fluid collection is
noted.

Stable ventricular dilatation is noted concerning for communicating
hydrocephalus. Stable amount of intraventricular hemorrhage is noted
in the right posterior horn.

Stable appearance of small amount of subarachnoid hemorrhage is seen
in right occipital lobe.

No new abnormality is noted compared to prior exam.

## 2018-07-29 IMAGING — US ULTRASOUND OF SCROTUM
1 series · 14 of 25 positions shown · non-contrast
Comparison: None.

CLINICAL DATA: Scrotal edema x1 day.

EXAM:
ULTRASOUND OF SCROTUM
TECHNIQUE: Complete ultrasound examination of the testicles, epididymis, and
other scrotal structures was performed.

[Series 1: ultrasound of scrotum · 14 of 32 slices shown]
[im 1/32]
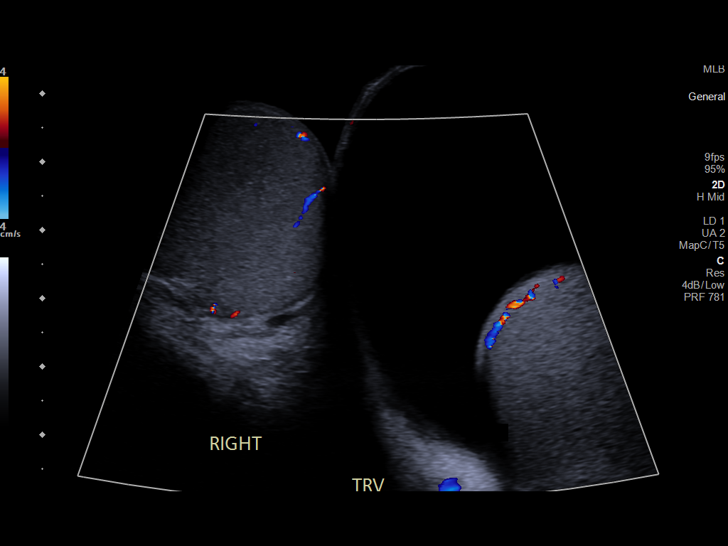
[im 3/32]
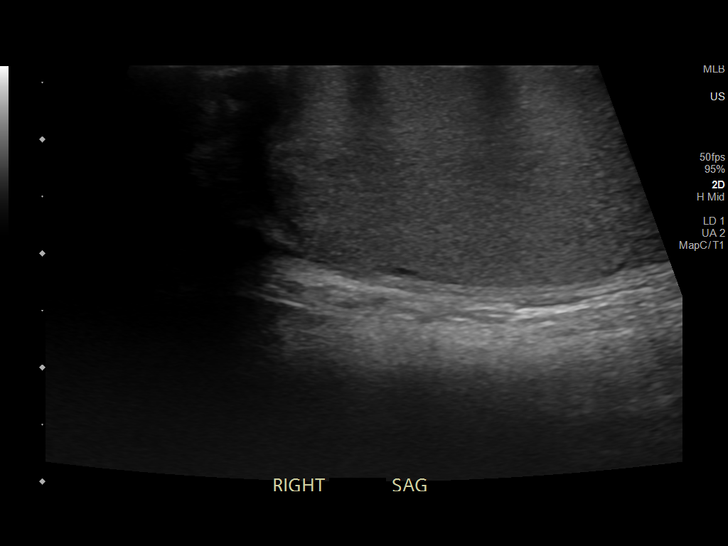
[im 6/32]
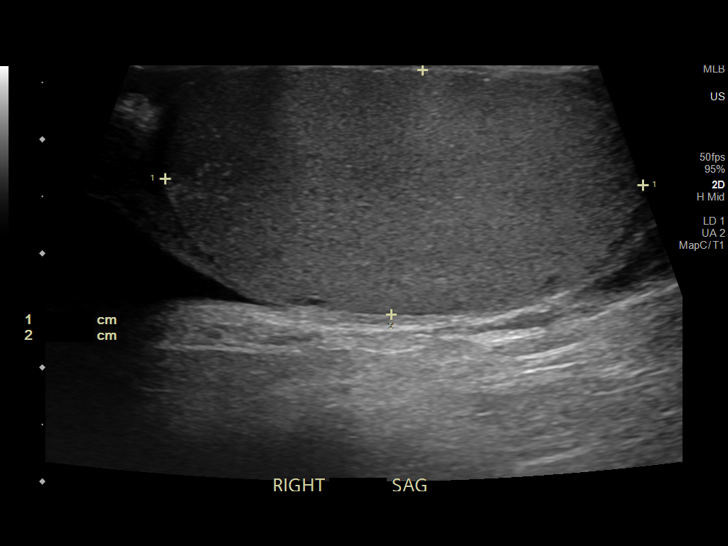
[im 8/32]
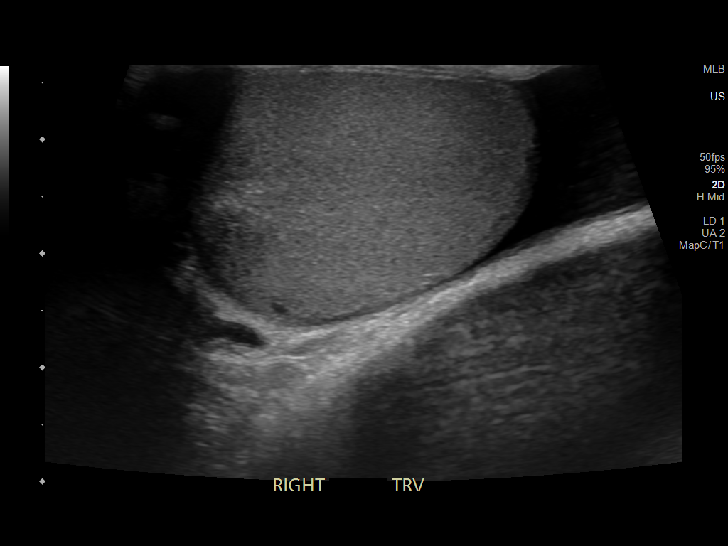
[im 11/32]
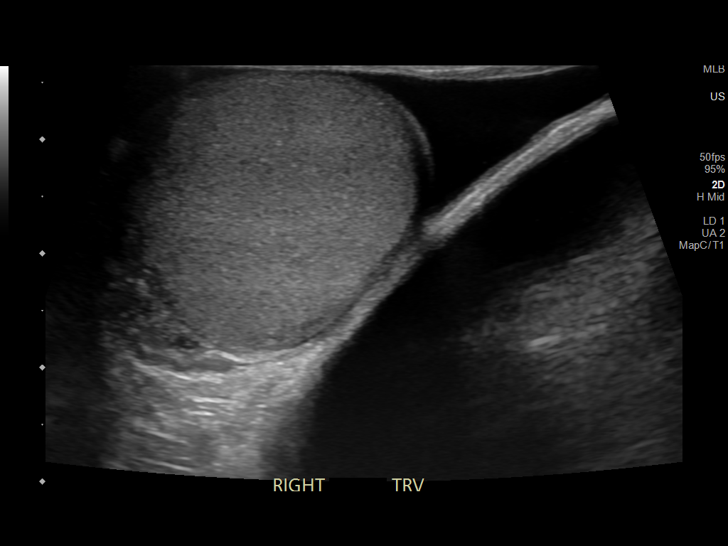
[im 12/32]
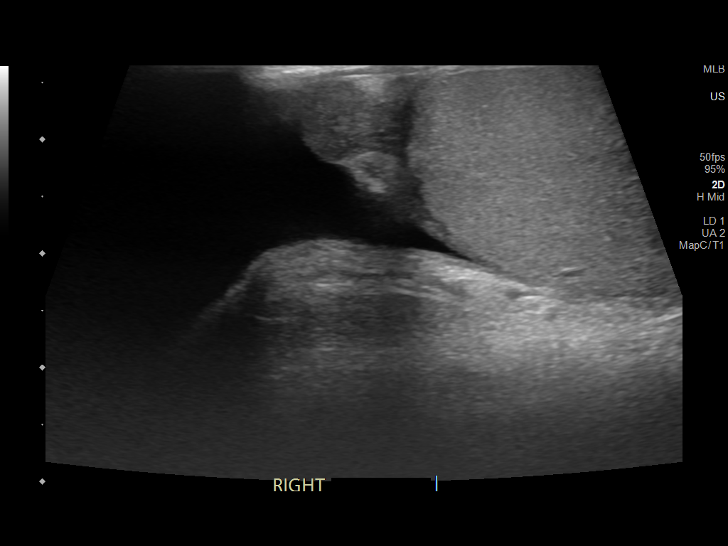
[im 15/32]
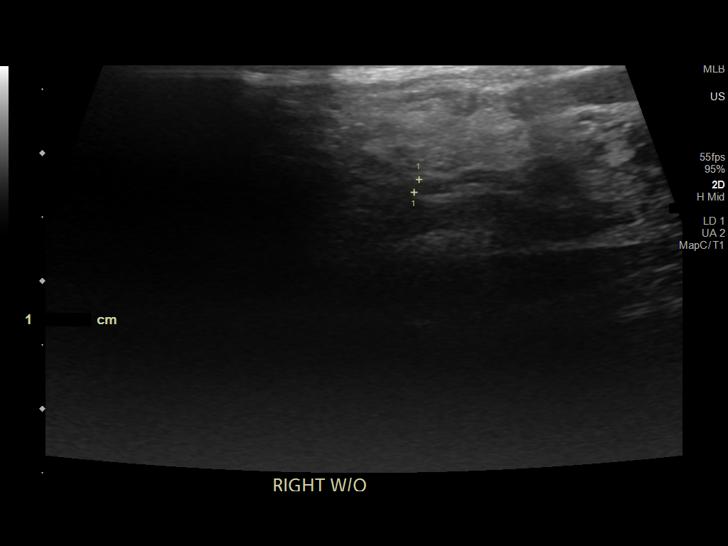
[im 17/32]
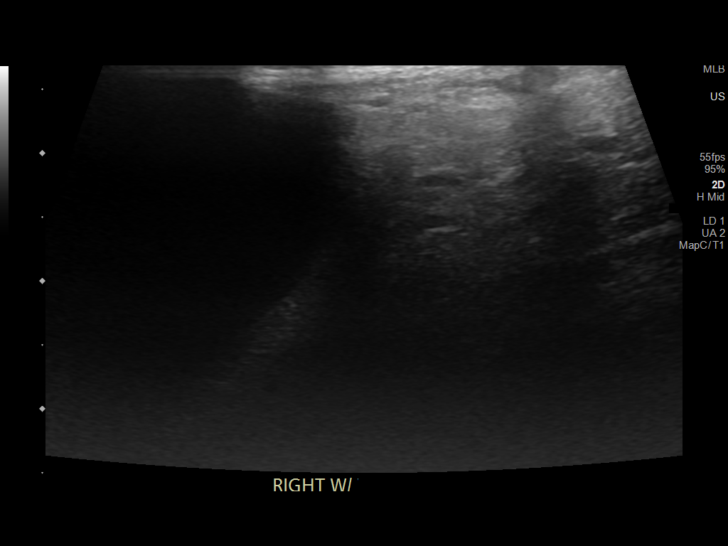
[im 20/32]
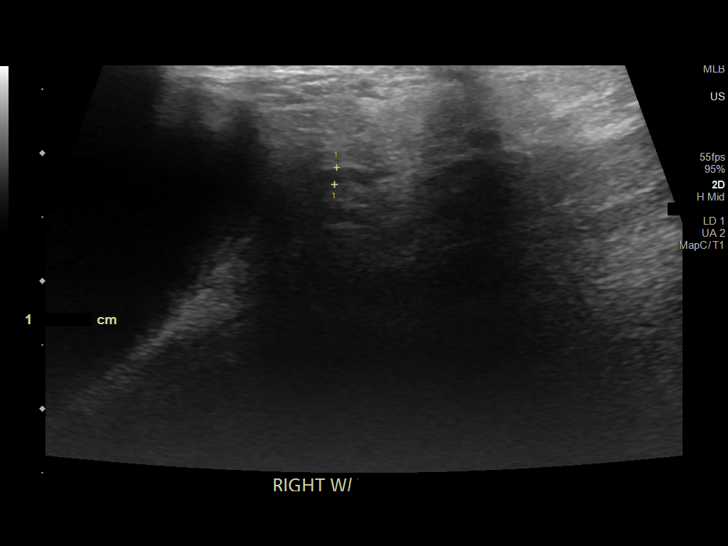
[im 21/32]
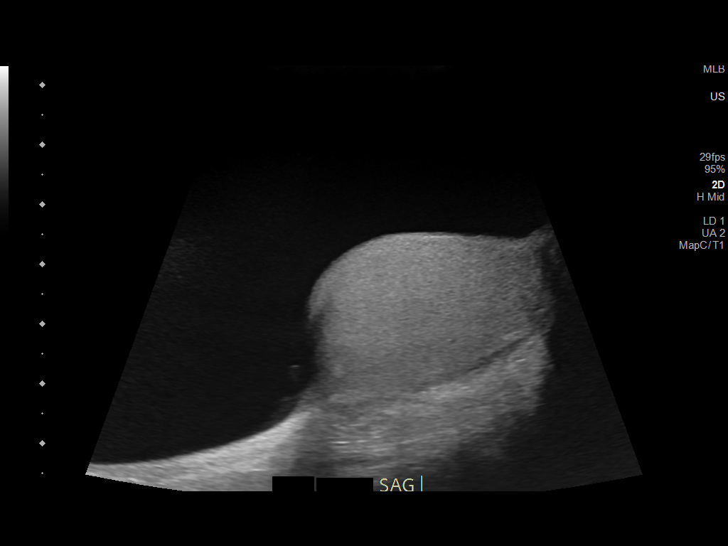
[im 24/32]
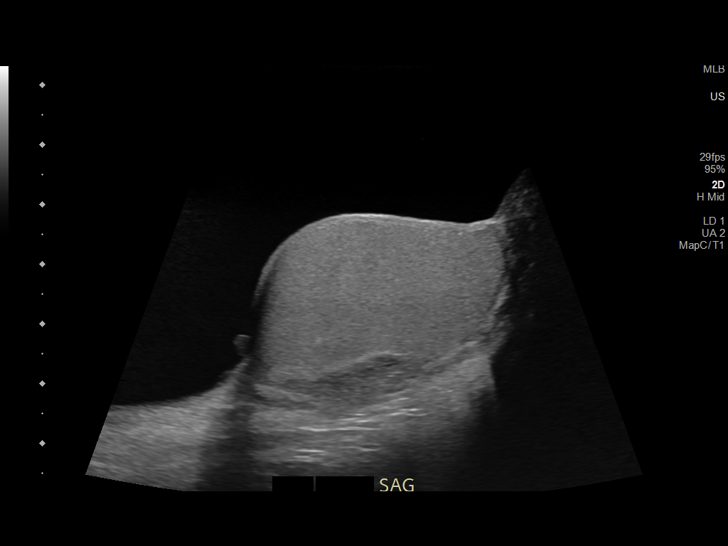
[im 26/32]
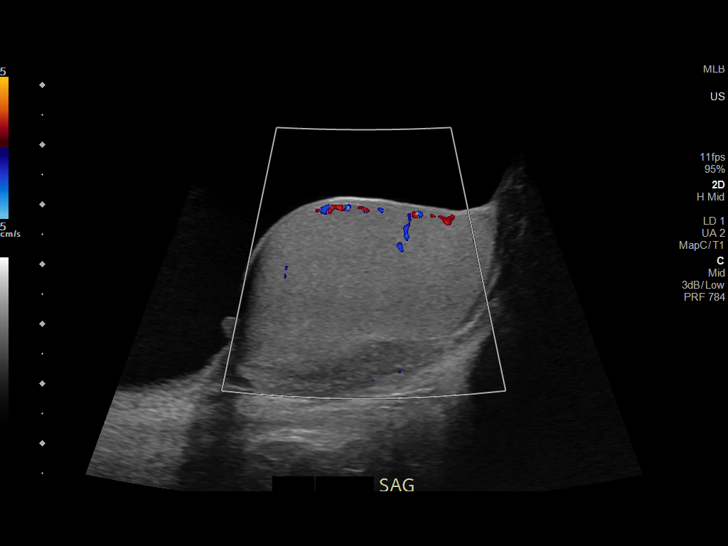
[im 29/32]
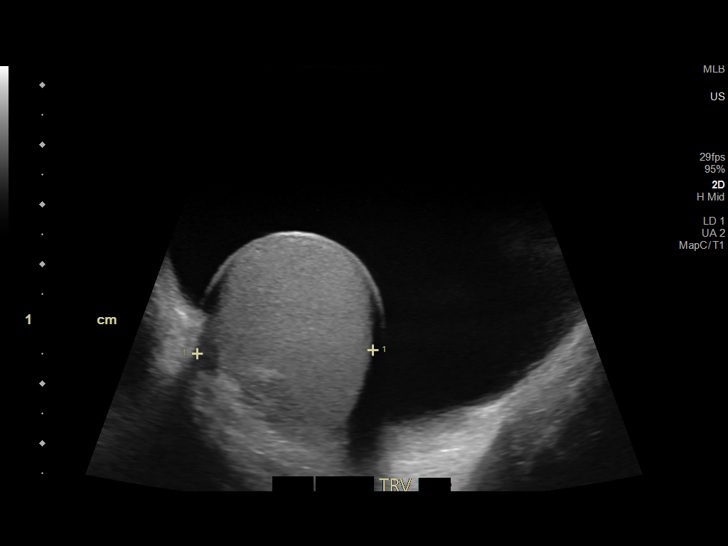
[im 32/32]
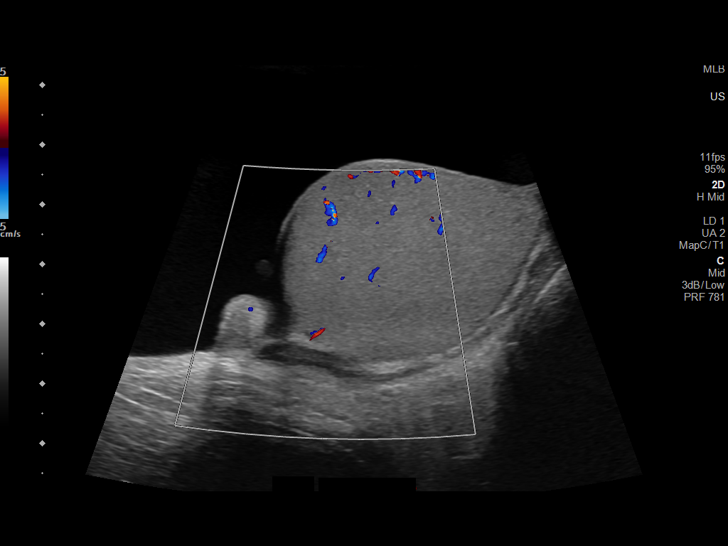

[14 of 25 positions shown; findings below may reference images not displayed]

FINDINGS: Right testicle

Measurements: 4.2 x 2.2 x 3.3 cm. No mass or microlithiasis
visualized.

Left testicle

Measurements: 4.7 x 2.8 x 2.9 cm. No mass or microlithiasis
visualized.

Right epididymis:  Normal in size and appearance.

Left epididymis:  Normal in size and appearance.

Hydrocele: There is a very large left-sided hydrocele. There is a
small to moderate sized right-sided hydrocele.

Varicocele:  None visualized.
IMPRESSION: 1. Large left-sided hydrocele. Small to moderate size right-sided
hydrocele.
2. Otherwise, no additional significant sonographic abnormality
detected.

## 2018-07-29 MED ORDER — AMOXICILLIN-POT CLAVULANATE 875-125 MG PO TABS
1.0000 | ORAL_TABLET | Freq: Two times a day (BID) | ORAL | Status: DC
  Administered 2018-07-29 – 2018-08-01 (×6): 1 via ORAL
  Filled 2018-07-29 (×7): qty 1

## 2018-07-29 MED ORDER — LEVETIRACETAM IN NACL 1000 MG/100ML IV SOLN: 1000.0000 mg | INTRAVENOUS | Status: DC

## 2018-07-29 MED ORDER — LEVETIRACETAM IN NACL 1000 MG/100ML IV SOLN
1000.0000 mg | INTRAVENOUS | Status: AC
  Administered 2018-07-29: 23:00:00 1000 mg via INTRAVENOUS
  Filled 2018-07-29: qty 100

## 2018-07-29 MED ORDER — LEVETIRACETAM 500 MG PO TABS: 500.0000 mg | ORAL_TABLET | Freq: Two times a day (BID) | ORAL | Status: DC

## 2018-07-29 MED ORDER — LEVETIRACETAM 500 MG PO TABS
500.0000 mg | ORAL_TABLET | Freq: Two times a day (BID) | ORAL | Status: DC
  Administered 2018-07-30: 10:00:00 500 mg via ORAL
  Filled 2018-07-29: qty 1

## 2018-07-29 MED ORDER — DEXTROSE 50 % IV SOLN
12.5000 g | INTRAVENOUS | Status: AC
  Administered 2018-07-29: 05:00:00 12.5 g via INTRAVENOUS
  Filled 2018-07-29: qty 50

## 2018-07-29 NOTE — Progress Notes (Addendum)
Neurology Progress Note   S:// Patient seen and examined.  No acute overnight complaints Upon chart review, had a hypoglycemic event with CBG 62 around 5:30 AM.  O:// Current vital signs: BP 130/70   Pulse 64   Temp (!) 97.3 F (36.3 C) (Oral) Comment: rn notified  Resp 20   Ht 5\' 9"  (1.753 m)   Wt 99 kg   SpO2 96%   BMI 32.23 kg/m  Vital signs in last 24 hours: Temp:  [97.3 F (36.3 C)-98 F (36.7 C)] 97.3 F (36.3 C) (05/19 0700) Pulse Rate:  [54-67] 64 (05/19 0734) Resp:  [16-25] 20 (05/19 0700) BP: (90-130)/(53-84) 130/70 (05/19 0734) SpO2:  [92 %-100 %] 96 % (05/19 0734) Weight:  [95.6 kg-99 kg] 99 kg (05/19 0500) General: Awake alert in no distress HEENT: Normocephalic atraumatic Lungs: Clear to auscultation Abdomen: Nondistended nontender Neurological exam Awake alert oriented to self, month.  Unable to tell me where he has but on prompting and asking him multiple choice questions about where he might be, he picked the right choice-hospital. Reduced attention concentration His speech is mildly dysarthric.  Naming, comprehension is intact.  Repetition is slow. Cranial nerves: Pupils equal round react light, extraocular movements intact, visual fields full, subtle right nasolabial fold flattening still present, palate and tongue midline. Motor exam: Bilateral upper extremities antigravity without any drift.  Both lower extremities have a mild drift predominantly due to poor attention concentration. Sensory exam: Intact light touch Coordination: Intact finger-nose-finger   Medications  Current Facility-Administered Medications:  .  acetaminophen (TYLENOL) tablet 650 mg, 650 mg, Oral, Q6H PRN **OR** acetaminophen (TYLENOL) suppository 650 mg, 650 mg, Rectal, Q6H PRN, Opyd, Lavone Neriimothy S, MD .  atorvastatin (LIPITOR) tablet 80 mg, 80 mg, Oral, q1800, Opyd, Lavone Neriimothy S, MD .  diltiazem (CARDIZEM CD) 24 hr capsule 360 mg, 360 mg, Oral, Daily, Opyd, Timothy S, MD .  fentaNYL  (SUBLIMAZE) injection 12.5-25 mcg, 12.5-25 mcg, Intravenous, Q2H PRN, Opyd, Timothy S, MD .  hydrALAZINE (APRESOLINE) injection 5 mg, 5 mg, Intravenous, Q4H PRN, Opyd, Timothy S, MD .  insulin aspart (novoLOG) injection 0-9 Units, 0-9 Units, Subcutaneous, Q4H, Opyd, Timothy S, MD .  metoprolol succinate (TOPROL-XL) 24 hr tablet 100 mg, 100 mg, Oral, Daily, Opyd, Timothy S, MD .  ondansetron (ZOFRAN) tablet 4 mg, 4 mg, Oral, Q6H PRN **OR** ondansetron (ZOFRAN) injection 4 mg, 4 mg, Intravenous, Q6H PRN, Opyd, Lavone Neriimothy S, MD .  polyethylene glycol (MIRALAX / GLYCOLAX) packet 17 g, 17 g, Oral, Daily PRN, Opyd, Timothy S, MD .  sodium chloride flush (NS) 0.9 % injection 3 mL, 3 mL, Intravenous, Once, Opyd, Timothy S, MD .  sodium chloride flush (NS) 0.9 % injection 3 mL, 3 mL, Intravenous, Q12H, Opyd, Timothy S, MD, 3 mL at 07/28/18 2300 Labs CBC    Component Value Date/Time   WBC 7.2 07/29/2018 0420   RBC 4.54 07/29/2018 0420   HGB 13.3 07/29/2018 0420   HCT 40.5 07/29/2018 0420   PLT 269 07/29/2018 0420   MCV 89.2 07/29/2018 0420   MCH 29.3 07/29/2018 0420   MCHC 32.8 07/29/2018 0420   RDW 14.3 07/29/2018 0420   LYMPHSABS 0.9 07/29/2018 0420   MONOABS 0.9 07/29/2018 0420   EOSABS 0.1 07/29/2018 0420   BASOSABS 0.0 07/29/2018 0420    CMP     Component Value Date/Time   NA 135 07/29/2018 0420   K 4.4 07/29/2018 0420   CL 102 07/29/2018 0420   CO2 18 (L)  07/29/2018 0420   GLUCOSE 70 07/29/2018 0420   BUN 20 07/29/2018 0420   CREATININE 1.03 07/29/2018 0420   CALCIUM 8.9 07/29/2018 0420   PROT 6.9 07/28/2018 1839   ALBUMIN 3.5 07/28/2018 1839   AST 16 07/28/2018 1839   ALT 16 07/28/2018 1839   ALKPHOS 103 07/28/2018 1839   BILITOT 1.8 (H) 07/28/2018 1839   GFRNONAA >60 07/29/2018 0420   GFRAA >60 07/29/2018 0420   Presentation labs were consistent with some AKI.  Imaging I have reviewed images in epic and the results pertinent to this consultation are: No new images to  review  Assessment: 67 year old with a recent subdural hematoma on the left with residual deficits ambulating with no cognitive deficits started having increasing sleepiness 3 days ago and yesterday 5:45 PM and sudden onset of right facial droop and garbled speech. CT of the head shows some residual left-sided subdural as well as developing hydrocephalus, and CTA of the head and neck with possible recent left vertebral artery occlusion- thrombus versus dissection. This could be from trauma that he had sustained which also led to the subdural versus a cardioembolic clot as he has newfound A. fib and is not on anticoagulation due to the recent subdural hematoma. His clinical exam today is improving. I suspect that his current presentation is a combination of some amount of hydrocephalus as well as encephalopathy from the subdural itself as well as metabolic derangements. An electrographic process such as seizures cannot be ruled out, hence EEG should be done. An MRI also should be performed to ensure there is no evidence of an acute ischemic stroke.  Impression:  -Waxing waning aphasia -recent traumatic subdural hematoma -rule ischemic out stroke  Recommendations: EEG MRI brain without contrast If the symptoms continue to wax and wane and there are electrographic abnormalities on the EEG, I would consider initiating antiepileptics at that time. Repeat CT head from the morning is pending.  Will follow. Correction of toxic metabolic derangements per primary team as you are. I would recommend also checking a urinalysis to evaluate for any evidence of underlying UTI  -- Milon Dikes, MD [facility] Pager: [phone] If 7pm to 7am, please call on call as listed on AMION.  Addendum Repeat head CT this morning unchanged from yesterday.

## 2018-07-29 NOTE — Progress Notes (Signed)
Pt is unavailable for EEG at this time. Will attempt later when schedule permits

## 2018-07-29 NOTE — Progress Notes (Signed)
Hypoglycemic Event  CBG: 67  Treatment: 4oz apple juice, per protocol  Symptoms: asymptomatic  Follow-up CBG: Time:1546 CBG Result:117  Comments/MD notified: Dr Caleb Popp notified

## 2018-07-29 NOTE — Progress Notes (Signed)
Rehab Admissions Coordinator Note:  Patient was screened by Stephania Fragmin, PT, DPT for appropriateness for an Inpatient Acute Rehab Consult.  At this time, we are recommending Inpatient Rehab consult.  Stephania Fragmin 07/29/2018, 11:45 AM  I can be reached at [phone].

## 2018-07-29 NOTE — Evaluation (Addendum)
Physical Therapy Evaluation Patient Details Name: [name] Frank MRN: [id number] DOB: 19-Jun-1950 Today's Date: 07/29/2018   History of Present Illness  Pt is a 68 year-old male who presented with AMS.  Found to have hydrocephalus and concern for seizures. Noted recent admission with laceration on back of his scalp. 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 Continued workup.   Clinical Impression  Pt admitted with above. Pt demonstrating no short term memory, delayed processing, difficulty sequencing, poor command follow in addition to generalized weakness, impaired balance and increased falls risk. Pt with regression both cognitively and functionally from last hospital stay 5/12-15. Recommend CIR upon d/c to address mentioned deficits. Acute PT to cont to follow.    Follow Up Recommendations CIR    Equipment Recommendations  Rolling walker with 5" wheels;3in1 (PT) , hospital bed   Recommendations for Other Services       Precautions / Restrictions Precautions Precautions: Fall Restrictions Weight Bearing Restrictions: No      Mobility  Bed Mobility Overal bed mobility: Needs Assistance Bed Mobility: Supine to Sit Rolling: Mod assist;+2 for safety/equipment   Supine to sit: Min assist     General bed mobility comments: increased time and effort, decreased initation and sequencing with mod assist for LB and trunk support  Transfers Overall transfer level: Needs assistance Equipment used: Rolling walker (2 wheeled) Transfers: Sit to/from Stand Sit to Stand: Min assist;Mod assist         General transfer comment: min-mod assist to power up, stability and balance with max verbal cueing for technique and safety   Ambulation/Gait Ambulation/Gait assistance: Min assist Gait Distance (Feet): 10 Feet(x2 (to/from bathroom)) Assistive device: Rolling walker (2 wheeled) Gait Pattern/deviations: Step-through pattern;Decreased  dorsiflexion - right;Decreased dorsiflexion - left;Trunk flexed Gait velocity: decreased Gait velocity interpretation: <1.8 ft/sec, indicate of risk for recurrent falls General Gait Details: pt with extremely short shuffled steps, max directional v/c's, modA for walker management  Stairs   Stairs assistance: Min assist Stair Management: One rail Right   General stair comments: cues for sequencing (up with left foot, down with right foot). step by step sequencing  Wheelchair Mobility    Modified Rankin (Stroke Patients Only) Modified Rankin (Stroke Patients Only) Pre-Morbid Rankin Score: No symptoms Modified Rankin: Moderately severe disability     Balance Overall balance assessment: Needs assistance;History of Falls Sitting-balance support: Feet supported Sitting balance-Leahy Scale: Fair Sitting balance - Comments: min guard for safety statically   Standing balance support: Bilateral upper extremity supported;During functional activity Standing balance-Leahy Scale: Poor Standing balance comment: reliant on B UE and external support                             Pertinent Vitals/Pain Pain Assessment: No/denies pain Faces Pain Scale: Hurts even more Pain Location: B LEs  Pain Descriptors / Indicators: Aching;Discomfort;Grimacing;Guarding;Moaning Pain Intervention(s): Monitored during session;Repositioned    Home Living Family/patient expects to be discharged to:: Private residence Living Arrangements: Spouse/significant other Available Help at Discharge: Family;Available 24 hours/day Type of Home: House Home Access: Stairs to enter Entrance Stairs-Rails: (bil) Entrance Stairs-Number of Steps: 5 Home Layout: Multi-level;Able to live on main level with bedroom/bathroom(hospital bed on 1st floor) Home Equipment: Dan Humphreys - 2 wheels;Walker - 4 wheels;Bedside commode      Prior Function Level of Independence: Needs assistance   Gait / Transfers Assistance  Needed: needing a little assist with transfers, but able  to ambulate with RW with min guard assist   ADL's / Homemaking Assistance Needed: needing a little assist with UB ADLs, mod assist with LB ADLs, and supervision for toileting   Comments: Pt reports he is retired from [employer] and 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      Hand Dominance   Dominant Hand: Right    Extremity/Trunk Assessment   Upper Extremity Assessment Upper Extremity Assessment: Generalized weakness    Lower Extremity Assessment Lower Extremity Assessment: Generalized weakness(pt unable to complete LAQ either leg due to tight hamstrongs)    Cervical / Trunk Assessment Cervical / Trunk Assessment: Other exceptions Cervical / Trunk Exceptions: Pt very guarded due to pain   Communication   Communication: Expressive difficulties  Cognition Arousal/Alertness: Awake/alert Behavior During Therapy: Flat affect Overall Cognitive Status: Impaired/Different from baseline Area of Impairment: Attention;Memory;Following commands;Safety/judgement;Awareness;Problem solving                 Orientation Level: Disoriented to;Time Current Attention Level: Focused Memory: Decreased recall of precautions;Decreased short-term memory Following Commands: Follows one step commands inconsistently;Follows one step commands with increased time Safety/Judgement: Decreased awareness of deficits;Decreased awareness of safety Awareness: Intellectual Problem Solving: Slow processing;Difficulty sequencing;Decreased initiation;Requires tactile cues;Requires verbal cues General Comments: patient requires constant cueing for recall of tasks, poor problem sovling and decreased initation; slow processing throughout session       General Comments General comments (skin integrity, edema, etc.): vss, OT took patient to the commode    Exercises     Assessment/Plan    PT Assessment Patient needs continued PT services   PT Problem List Decreased strength;Decreased activity tolerance;Decreased balance;Decreased mobility;Decreased cognition;Decreased safety awareness       PT Treatment Interventions Gait training;Stair training;Functional mobility training;Therapeutic activities;Therapeutic exercise;Balance training;Neuromuscular re-education;Cognitive remediation;Patient/family education    PT Goals (Current goals can be found in the Care Plan section)  Acute Rehab PT Goals Patient Stated Goal: none stated PT Goal Formulation: With patient Time For Goal Achievement: 08/12/18 Potential to Achieve Goals: Good    Frequency Min 4X/week   Barriers to discharge        Co-evaluation PT/OT/SLP Co-Evaluation/Treatment: Yes Reason for Co-Treatment: For patient/therapist safety PT goals addressed during session: Mobility/safety with mobility OT goals addressed during session: ADL's and self-care       AM-PAC PT "6 Clicks" Mobility  Outcome Measure Help needed turning from your back to your side while in a flat bed without using bedrails?: A Little Help needed moving from lying on your back to sitting on the side of a flat bed without using bedrails?: A Lot Help needed moving to and from a bed to a chair (including a wheelchair)?: A Little Help needed standing up from a chair using your arms (e.g., wheelchair or bedside chair)?: A Little Help needed to walk in hospital room?: A Lot Help needed climbing 3-5 steps with a railing? : A Lot 6 Click Score: 15    End of Session Equipment Utilized During Treatment: Gait belt Activity Tolerance: Patient tolerated treatment well Patient left: in chair;with call bell/phone within reach;with chair alarm set Nurse Communication: Mobility status PT Visit Diagnosis: Unsteadiness on feet (R26.81);Difficulty in walking, not elsewhere classified (R26.2);Other symptoms and signs involving the nervous system (R29.898)    Time: [id number] PT Time Calculation (min)  (ACUTE ONLY): 33 min   Charges:   PT Evaluation $PT Eval Moderate Complexity: 1 Mod          Mirel Hundal, PT,  DPT Acute Rehabilitation Services Pager #: [phone] Office #: [phone]   Iona Hansen 07/29/2018, 12:24 PM

## 2018-07-29 NOTE — Evaluation (Addendum)
Occupational Therapy Evaluation Patient Details Name: [name] Frank MRN: [id number] DOB: Jun 04, 1950 Today's Date: 07/29/2018    History of Present Illness Pt is a 68 year-old male who presented with AMS.  Found to have hydrocephalus and concern for seizures. Noted recent admission with laceration on back of his scalp. 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 Continued workup.    Clinical Impression   PTA spouse reports pt requires min assist to close stand by for mobility using RW, min assist for transfers using RW, and some assist with ADLs. Pt admitted for above and limited by problem list below, including impaired cognition (initation, problem solving, sequencing, memory, attention), pain in B LEs, decreased balance, generalized weakness, and poor activity tolerance. Cognitively, patient able to follow 1 step commands inconsistently with increased time, requires constant redirection and cueing to recall current task, with poor awareness of safety/deficits. Patient requires min -mod assist +2 for transfers and mobility, min assist for grooming, min-mod assist UB ADLs, total assist for LB ADLs.  Believe he will best benefit from continued OT services while admitted and after dc at CIR level in order to maximize independence and safety piror to dc home with spouses support.  Will follow.     Follow Up Recommendations  CIR;Supervision/Assistance - 24 hour    Equipment Recommendations  Other (comment)(TBD at next venue of care)    Recommendations for Other Services Rehab consult     Precautions / Restrictions Precautions Precautions: Fall Restrictions Weight Bearing Restrictions: No      Mobility Bed Mobility Overal bed mobility: Needs Assistance Bed Mobility: Supine to Sit Rolling: Mod assist;+2 for safety/equipment         General bed mobility comments: increased time and effort, decreased initation and sequencing with mod assist  for LB and trunk support  Transfers Overall transfer level: Needs assistance Equipment used: Rolling walker (2 wheeled) Transfers: Sit to/from Stand Sit to Stand: Min assist;Mod assist         General transfer comment: min-mod assist to power up, stability and balance with max verbal cueing for technique and safety     Balance Overall balance assessment: Needs assistance;History of Falls Sitting-balance support: Feet supported Sitting balance-Leahy Scale: Fair Sitting balance - Comments: min guard for safety statically   Standing balance support: Bilateral upper extremity supported;During functional activity Standing balance-Leahy Scale: Poor Standing balance comment: reliant on B UE and external support                           ADL either performed or assessed with clinical judgement   ADL Overall ADL's : Needs assistance/impaired     Grooming: Minimal assistance;Sitting   Upper Body Bathing: Sitting;Moderate assistance   Lower Body Bathing: Sit to/from stand;Total assistance;+2 for safety/equipment   Upper Body Dressing : Moderate assistance;Sitting   Lower Body Dressing: Total assistance;+2 for safety/equipment;Sit to/from stand   Toilet Transfer: Minimal assistance;Moderate assistance;RW;Ambulation;+2 for physical assistance;+2 for safety/equipment   Toileting- Clothing Manipulation and Hygiene: Maximal assistance;Sit to/from stand       Functional mobility during ADLs: Minimal assistance;+2 for safety/equipment;Rolling walker General ADL Comments: pt limited by cognition, pain and balance     Vision   Additional Comments: further asessment required     Perception     Praxis      Pertinent Vitals/Pain Pain Assessment: Faces Faces Pain Scale: Hurts even more Pain Location: B LEs  Pain Descriptors /  Indicators: Aching;Discomfort;Grimacing;Guarding;Moaning Pain Intervention(s): Monitored during session;Repositioned     Hand Dominance  Right   Extremity/Trunk Assessment Upper Extremity Assessment Upper Extremity Assessment: Generalized weakness   Lower Extremity Assessment Lower Extremity Assessment: Generalized weakness(pt unable to complete LAQ either leg due to tight hamstrongs)       Communication Communication Communication: Expressive difficulties   Cognition Arousal/Alertness: Awake/alert Behavior During Therapy: Flat affect Overall Cognitive Status: Impaired/Different from baseline Area of Impairment: Attention;Memory;Following commands;Safety/judgement;Awareness;Problem solving                   Current Attention Level: Focused Memory: Decreased recall of precautions;Decreased short-term memory Following Commands: Follows one step commands inconsistently;Follows one step commands with increased time Safety/Judgement: Decreased awareness of deficits;Decreased awareness of safety Awareness: Intellectual Problem Solving: Slow processing;Difficulty sequencing;Decreased initiation;Requires tactile cues;Requires verbal cues General Comments: patient requires constant cueing for recall of tasks, poor problem sovling and decreased initation; slow processing throughout session    General Comments       Exercises     Shoulder Instructions      Home Living Family/patient expects to be discharged to:: Private residence Living Arrangements: Spouse/significant other Available Help at Discharge: Family;Available 24 hours/day Type of Home: House Home Access: Stairs to enter [employer] of Steps: 5 Entrance Stairs-Rails: (bil) Home Layout: Multi-level;Able to live on main level with bedroom/bathroom(hospital bed on 1st floor)     Bathroom Shower/Tub: Walk-in shower   Bathroom Toilet: Handicapped height     Home Equipment: Environmental consultant - 2 wheels;Walker - 4 wheels;Bedside commode          Prior Functioning/Environment Level of Independence: Needs assistance  Gait / Transfers Assistance  Needed: needing a little assist with transfers, but able to ambulate with RW with min guard assist  ADL's / Homemaking Assistance Needed: needing a little assist with UB ADLs, mod assist with LB ADLs, and supervision for toileting             OT Problem List: Decreased strength;Decreased activity tolerance;Impaired balance (sitting and/or standing);Decreased cognition;Decreased safety awareness;Impaired vision/perception;Decreased coordination      OT Treatment/Interventions: Self-care/ADL training;Neuromuscular education;DME and/or AE instruction;Therapeutic activities;Patient/family education;Balance training;Visual/perceptual remediation/compensation;Cognitive remediation/compensation    OT Goals(Current goals can be found in the care plan section) Acute Rehab OT Goals Patient Stated Goal: none stated  OT Frequency: Min 3X/week   Barriers to D/C:            Co-evaluation PT/OT/SLP Co-Evaluation/Treatment: Yes Reason for Co-Treatment: For patient/therapist safety   OT goals addressed during session: ADL's and self-care      AM-PAC OT "6 Clicks" Daily Activity     Outcome Measure Help from another person eating meals?: A Little Help from another person taking care of personal grooming?: A Little Help from another person toileting, which includes using toliet, bedpan, or urinal?: A Lot Help from another person bathing (including washing, rinsing, drying)?: A Lot Help from another person to put on and taking off regular upper body clothing?: A Lot Help from another person to put on and taking off regular lower body clothing?: Total 6 Click Score: 13   End of Session Equipment Utilized During Treatment: Gait belt;Rolling walker Nurse Communication: Mobility status  Activity Tolerance: Patient tolerated treatment well Patient left: in chair;with call bell/phone within reach;with chair alarm set  OT Visit Diagnosis: Unsteadiness on feet (R26.81);Cognitive communication  deficit (R41.841);Muscle weakness (generalized) (M62.81);Pain;Other symptoms and signs involving cognitive function Symptoms and signs involving cognitive functions: Other cerebrovascular disease Pain - Right/Left: (bil)  Pain - part of body: Leg                Time: 1004-1043 OT Time Calculation (min): 39 min Charges:  OT General Charges $OT Visit: 1 Visit OT Evaluation $OT Eval Moderate Complexity: 1 Mod  Chancy Milroyhristie S Eppie Barhorst, OT Acute Rehabilitation Services Pager [phone] Office [phone]   Chancy MilroyChristie S Jaidan Stachnik 07/29/2018, 11:35 AM

## 2018-07-29 NOTE — Progress Notes (Signed)
EEG complete - results pending 

## 2018-07-29 NOTE — Progress Notes (Signed)
Patient seen and examined at bedside, patient admitted after midnight, please see earlier detailed admission note by Briscoe Deutscher, MD. Briefly, patient presented with facial droop and speech difficulty concerning for stroke initially, but found to have hydrocephalus and SDH. Neurology and neurosurgery consulted. Currently undergoing evaluation for possible seizures. On examination today, patient noted to have a significantly tender and swollen scrotum. Mild erythema. Will obtain stat scrotal ultrasound. Start on empiric augmentin.   Jacquelin Hawking, MD [facility] 07/29/2018, 1:48 PM

## 2018-07-29 NOTE — Progress Notes (Signed)
EEG with left hemispheric slowing and rare sharps. Would initiate AED  REC: Keppra 1g IV x1 Followed by Keppra 500 mg BID.  Will follow.  Milon Dikes, MD

## 2018-07-29 NOTE — Telephone Encounter (Signed)
Pt admitted

## 2018-07-29 NOTE — Procedures (Signed)
ELECTROENCEPHALOGRAM REPORT   Patient: [name] Frank       Room #: 9V91Y EEG No. ID: [id number] Age: 68 y.o.        Sex: male Referring Physician: Caleb Popp Report Date:  07/29/2018        Interpreting Physician: Thana Farr  History: [name] Frank is an 68 y.o. male with left SDH  Medications:  Augmentin, Lipitor, Cardizem, Toprol  Conditions of Recording:  This is a 21 channel routine scalp EEG performed with bipolar and monopolar montages arranged in accordance to the international 10/20 system of electrode placement. One channel was dedicated to EKG recording.  The patient is in the awake and drowsy states.  Description:  The waking background activity consists of a low voltage, fairly well organized, 8 Hz alpha activity, seen from the parieto-occipital and posterior temporal regions.  Low voltage fast activity, poorly organized, is seen anteriorly and is at times superimposed on more posterior regions.     A mixture of theta and alpha rhythms are seen from the central and temporal regions. This activity is not well maintained over the left hemisphere where there is a persistent polymorphic delta rhythm that slows the background focally and is most prominent from the left temporal region.  The patient drowses with slowing to irregular, low voltage theta and beta activity but despite the slowing noted with drowse the left hemisphericactivity remains focally slower.  There are rare left temporal sharp waves noted as well.   Stage II sleep is not obtained. Hyperventilation and intermittent photic stimulation were not performed.  IMPRESSION: This is an abnormal electroencephalogram secondary to left hemispheric slowing, most prominent in the left temporal region, consistent with the patient's history of left SDH.  Also noted are rare left temporal sharp waves suggestive a epileptogenic potential.     Thana Farr, MD Neurology [phone] 07/29/2018, 6:03 PM

## 2018-07-29 NOTE — Progress Notes (Signed)
Pt admitted to the unit from ED: pt alert and verbally responsive; pt oriented to the unit and room; fall/safety precaution and prevention education completed with pt; telemetry applied and verified with CCMD; NT called to second verify; skin assessment completed with second RN Phil per protocol; skin intact with no pressure ulcers or opened wounds except ecchymosis to back and hip area; abrasions to BUE and BLE; scrotum swollen with small lesion noted; VSS: call light within reach; bed alarm on; will continue to closely monitor. Dionne Bucy RN   07/28/18 2133  Vitals  BP 96/84  MAP (mmHg) 90  BP Location Right Arm  BP Method Automatic  Patient Position (if appropriate) Lying  Pulse Rate 60  Pulse Rate Source Monitor  ECG Heart Rate 60  Cardiac Rhythm SB  Resp 20  Oxygen Therapy  SpO2 98 %  O2 Device Room Air  PCA/Epidural/Spinal Assessment  Respiratory Pattern Regular;Unlabored  Height and Weight  Height 5\' 9"  (1.753 m)  Weight 95.8 kg  Type of Scale Used Bed  Type of Weight Actual  BSA (Calculated - sq m) 2.16 sq meters  BMI (Calculated) 31.17  Weight in (lb) to have BMI = 25 168.9  Level of Consciousness  Level of Consciousness Alert  MEWS Score  MEWS RR 0  MEWS Pulse 0  MEWS Systolic 1  MEWS LOC 0  MEWS Temp 0  MEWS Score 1  MEWS Score Color Green

## 2018-07-29 NOTE — Progress Notes (Signed)
Hypoglycemic Event  CBG: 62  Treatment: D50 25 mL (12.5 gm)  Symptoms: None  Follow-up CBG: Time:0539 CBG Result: 107  Possible Reasons for Event: Inadequate meal intake and Change in activity  Comments/MD notified: follow standing orders per protocol.    [name] Frank

## 2018-07-29 NOTE — Progress Notes (Signed)
INTERIM PROGRESS NOTE  MRI of the brain completed and reviewed  No acute stroke noted on MRI.  Re-demonstrated - 4mm left sided SDH (appears to have some local mass effect and sulcal flattening) and stable hydrocephalus.    I discussed with neurosurgeon, Dr. Maurice Small because of the local mass effect, which might be causing lowered seizure threshold and be the cause of electrographic disturbance eminating from that area. He has no intervention planned for now. His thought process is to start the AED so that if presentation is secondary to seizures, they can be controlled with medication rather than invasive procedure and the invasive procedure option should be reserved for if bleed worsens or if seizures are refractory due to the offending agent (bleed) constantly irritating the cortex in that location.  No plans by neurosurgery for any intervention for now.  I will wait for the EEG to be completed prior to committing him to AEDs .   Will follow.   -- Milon Dikes, MD [facility] Pager: [phone] If 7pm to 7am, please call on call as listed on AMION.

## 2018-07-29 NOTE — Evaluation (Signed)
Clinical/Bedside Swallow Evaluation Patient Details  Name: [name] Frank MRN: [id number] Date of Birth: Mar 08, 1951  Today's Date: 07/29/2018 Time: SLP Start Time (ACUTE ONLY): 0831 SLP Stop Time (ACUTE ONLY): 0855 SLP Time Calculation (min) (ACUTE ONLY): 24 min  Past Medical History:  Past Medical History:  Diagnosis Date  . Diabetes mellitus   . Heart disease   . Hyperlipemia   . Hypertension    Past Surgical History:  Past Surgical History:  Procedure Laterality Date  . catarracts    . OTHER SURGICAL HISTORY  2016   HEART STENT  . TONSILLECTOMY     HPI:  Pt is a 68 year-old male who presented with AMS.  Found to have hydrocephalus and concern for seizures.  Pt with recent admission with a laceration on the back of his scalp and questionable LOC. CT of the head showed acute subdural hematoma along the left cerebral convexity measuring up to 11 mm in thickness. Mild cerebral mass effect. Patchy subarachnoid hemorrhage with a traumatic pattern.  Pt did not pass Yale swallow screen and SLP ordered.  Imaging showed left frontoparietal SDH.     Assessment / Plan / Recommendation Clinical Impression  Patient presents with signs of mild oral dysphagia characterized by prolonged/slow mastication and suspected oral holding with liquids - likely cognitive based.  No indication of aspiration with all consistencies noted.  BSE abbreviated due to pt needing to leave for xray CT head.  Pt did not follow directions for 3 ounce water test.  He was perseverative and distracted by his scrotal/penis discomfort.  Recommend dys3/thin due to pt's decreased attention.  Medication with puree.  Upon review of pt's prior cognitive evaluation, he is significantly impaired currently.  Will follow up for dysphagia management and cog eval.   SLP Visit Diagnosis: Dysphagia, oral phase (R13.11)    Aspiration Risk  Mild aspiration risk    Diet Recommendation Dysphagia 3 (Mech soft)   Liquid Administration via:  Cup;Straw Medication Administration: Whole meds with puree Supervision: Patient able to self feed Compensations: Small sips/bites;Slow rate;Minimize environmental distractions;Lingual sweep for clearance of pocketing Postural Changes: Seated upright at 90 degrees;Remain upright for at least 30 minutes after po intake    Other  Recommendations Oral Care Recommendations: Oral care QID   Follow up Recommendations        Frequency and Duration min 2x/week  2 weeks       Prognosis Prognosis for Safe Diet Advancement: Good Barriers to Reach Goals: Cognitive deficits      Swallow Study   General Date of Onset: 07/29/18 HPI: Pt is a 68 year-old male who presented with AMS.  Found to have hydrocephalus and concern for seizures.  Pt with recent admission with a laceration on the back of his scalp and questionable LOC. CT of the head showed acute subdural hematoma along the left cerebral convexity measuring up to 11 mm in thickness. Mild cerebral mass effect. Patchy subarachnoid hemorrhage with a traumatic pattern.  Pt did not pass Yale swallow screen and SLP ordered.  Imaging showed left frontoparietal SDH.   Type of Study: Bedside Swallow Evaluation Diet Prior to this Study: NPO Temperature Spikes Noted: No Respiratory Status: Room air History of Recent Intubation: No Behavior/Cognition: Cooperative;Alert;Confused;Distractible;Requires cueing(perseverative) Oral Cavity Assessment: Within Functional Limits Oral Care Completed by SLP: No Oral Cavity - Dentition: Adequate natural dentition Vision: Functional for self-feeding Self-Feeding Abilities: Able to feed self Patient Positioning: Upright in bed Baseline Vocal Quality: Normal Volitional Cough: Strong Volitional Swallow:  Able to elicit    Oral/Motor/Sensory Function Overall Oral Motor/Sensory Function: Mild impairment Facial ROM: (left eye decreased opening, pt states this is normal) Facial Symmetry: Abnormal symmetry left(left eye  decreased opening) Facial Strength: Within Functional Limits Facial Sensation: (dnt) Lingual ROM: Within Functional Limits Lingual Symmetry: Within Functional Limits Lingual Strength: Other (Comment)(mildly impaired, pt dysarthric) Lingual Sensation: (dnt) Velum: Within Functional Limits Mandible: Within Functional Limits   Ice Chips Ice chips: Within functional limits Presentation: Spoon   Thin Liquid Thin Liquid: Impaired Presentation: Straw Pharyngeal  Phase Impairments: Suspected delayed Swallow    Nectar Thick Nectar Thick Liquid: Not tested   Honey Thick Honey Thick Liquid: Not tested   Puree Puree: Within functional limits Presentation: Spoon;Self Fed   Solid     Solid: Impaired Presentation: Self Fed Oral Phase Impairments: (slow mastication, needing liquids to clear) Other Comments: pt took very small bite      Chales AbrahamsKimball, Priti Consoli Ann 07/29/2018,9:04 AM    Donavan Burnetamara Konstantin Lehnen, MS [facility] SLP Acute Rehab Services Pager [phone] Office [phone]

## 2018-07-30 ENCOUNTER — Ambulatory Visit: Payer: Medicare HMO | Admitting: Family Medicine

## 2018-07-30 DIAGNOSIS — G919 Hydrocephalus, unspecified: Secondary | ICD-10-CM

## 2018-07-30 LAB — GLUCOSE, CAPILLARY
Glucose-Capillary: 104 mg/dL — ABNORMAL HIGH (ref 70–99)
Glucose-Capillary: 105 mg/dL — ABNORMAL HIGH (ref 70–99)
Glucose-Capillary: 152 mg/dL — ABNORMAL HIGH (ref 70–99)
Glucose-Capillary: 154 mg/dL — ABNORMAL HIGH (ref 70–99)
Glucose-Capillary: 87 mg/dL (ref 70–99)
Glucose-Capillary: 89 mg/dL (ref 70–99)

## 2018-07-30 LAB — BASIC METABOLIC PANEL
Anion gap: 16 — ABNORMAL HIGH (ref 5–15)
BUN: 17 mg/dL (ref 8–23)
CO2: 18 mmol/L — ABNORMAL LOW (ref 22–32)
Calcium: 8.7 mg/dL — ABNORMAL LOW (ref 8.9–10.3)
Chloride: 103 mmol/L (ref 98–111)
Creatinine, Ser: 0.89 mg/dL (ref 0.61–1.24)
GFR calc Af Amer: >60 mL/min (ref >60)
GFR calc non Af Amer: >60 mL/min (ref >60)
Glucose, Bld: 91 mg/dL (ref 70–99)
Potassium: 4.2 mmol/L (ref 3.5–5.1)
Sodium: 137 mmol/L (ref 135–145)

## 2018-07-30 LAB — CBC
HCT: 40.8 % (ref 39.0–52.0)
Hemoglobin: 13.2 g/dL (ref 13.0–17.0)
MCH: 29.1 pg (ref 26.0–34.0)
MCHC: 32.4 g/dL (ref 30.0–36.0)
MCV: 89.9 fL (ref 80.0–100.0)
Platelets: 222 10*3/uL (ref 150–400)
RBC: 4.54 MIL/uL (ref 4.22–5.81)
RDW: 14.3 % (ref 11.5–15.5)
WBC: 6.2 10*3/uL (ref 4.0–10.5)
nRBC: 0 % (ref 0.0–0.2)

## 2018-07-30 MED ORDER — LEVETIRACETAM IN NACL 500 MG/100ML IV SOLN
500.0000 mg | Freq: Two times a day (BID) | INTRAVENOUS | Status: DC
  Administered 2018-07-30: 22:00:00 500 mg via INTRAVENOUS
  Filled 2018-07-30: qty 100

## 2018-07-30 MED ORDER — DILTIAZEM HCL 30 MG PO TABS: 90.0000 mg | ORAL_TABLET | Freq: Four times a day (QID) | ORAL | Status: DC

## 2018-07-30 NOTE — Progress Notes (Signed)
Pt talking to wife via telephone and noted to be very vocal. Reassessed mNIHSS d/t improvement in condition. Wife asked pt if he was being "stubborn" d/t lack of verbalizations to this writer in a.m. Pt laughted and say he was being stubborn. Did educate on importance of stroke scale and encouraged compliance. Pt agrees to do so.

## 2018-07-30 NOTE — Progress Notes (Signed)
Physical Therapy Treatment Patient Details Name: [name] Frank MRN: [id number] DOB: [birth year] Today's Date: 07/30/2018    History of Present Illness Pt is a 68 year-old male who presented with AMS.  Found to have hydrocephalus and concern for seizures. Noted recent admission with laceration on back of his scalp. 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 Continued workup. Pt is s/p EEG which showed left hemispheric slowing with rare left temporal sharp waves.     PT Comments    Pt progressing towards goals. Upon entry, pt with large bowel movement, so practiced rolling for clean up. Required mod A for assist with rolling. Pt requiring min to mod A for standing and transfer to chair this session. Pt continuing to require multimodal cues for sequencing throughout mobility tasks. Pt reporting dizziness during transfers, however, VSS. Current recommendations appropriate. Will continue to follow acutely to maximize functional mobility independence and safety.     Follow Up Recommendations  CIR     Equipment Recommendations  Rolling walker with 5" wheels;3in1 (PT)    Recommendations for Other Services       Precautions / Restrictions Precautions Precautions: Fall Restrictions Weight Bearing Restrictions: No    Mobility  Bed Mobility Overal bed mobility: Needs Assistance Bed Mobility: Rolling;Sidelying to Sit Rolling: Mod assist Sidelying to sit: Mod assist       General bed mobility comments: Mod A for rolling for clean up following BM. Required multimodal cues and assist to move LEs off of bed and for hand placement to assist with trunk elevation.   Transfers Overall transfer level: Needs assistance Equipment used: Rolling walker (2 wheeled) Transfers: Sit to/from [employer] Sit to Stand: Min assist;Mod assist Stand pivot transfers: Min assist       General transfer comment: Min to mod A For lift assist and  steadying to stand X2. Pt able to maintain standing for ~3 mins for clean up, however, then reported dizziness and required seated rest. Checked BP and BP WFL. Performed stand pivot transfer to chair requiring min A for steadying and multimodal cues for sequencing using RW. Pt reporting dizziness during transfer, so further mobility limited. VSS.   Ambulation/Gait                 Stairs             Wheelchair Mobility    Modified Rankin (Stroke Patients Only) Modified Rankin (Stroke Patients Only) Pre-Morbid Rankin Score: No symptoms Modified Rankin: Moderately severe disability     Balance Overall balance assessment: Needs assistance;History of Falls Sitting-balance support: Feet supported Sitting balance-Leahy Scale: Fair     Standing balance support: Bilateral upper extremity supported;During functional activity Standing balance-Leahy Scale: Poor Standing balance comment: reliant on B UE and external support                            Cognition Arousal/Alertness: Awake/alert Behavior During Therapy: Flat affect Overall Cognitive Status: Impaired/Different from baseline Area of Impairment: Attention;Memory;Following commands;Safety/judgement;Awareness;Problem solving                   Current Attention Level: Focused Memory: Decreased recall of precautions;Decreased short-term memory Following Commands: Follows one step commands with increased time Safety/Judgement: Decreased awareness of deficits;Decreased awareness of safety Awareness: Intellectual Problem Solving: Slow processing;Difficulty sequencing;Decreased initiation;Requires tactile cues;Requires verbal cues General Comments: Pt frequently self distracts, and requires cues to redirect to task.  Required multimodal cues for sequencing throughout mobility.       Exercises      General Comments        Pertinent Vitals/Pain Pain Assessment: Faces Pain Score: 3  Faces Pain  Scale: Hurts a little bit Pain Location: BLE Pain Descriptors / Indicators: Sore Pain Intervention(s): Limited activity within patient's tolerance;Monitored during session;Repositioned    Home Living     Available Help at Discharge: Family;Available 24 hours/day Type of Home: House              Prior Function            PT Goals (current goals can now be found in the care plan section) Acute Rehab PT Goals Patient Stated Goal: to get cleaned up  PT Goal Formulation: With patient Time For Goal Achievement: 08/12/18 Potential to Achieve Goals: Good Progress towards PT goals: Progressing toward goals    Frequency    Min 4X/week      PT Plan Current plan remains appropriate    Co-evaluation              AM-PAC PT "6 Clicks" Mobility   Outcome Measure  Help needed turning from your back to your side while in a flat bed without using bedrails?: A Lot Help needed moving from lying on your back to sitting on the side of a flat bed without using bedrails?: A Lot Help needed moving to and from a bed to a chair (including a wheelchair)?: A Little Help needed standing up from a chair using your arms (e.g., wheelchair or bedside chair)?: A Lot Help needed to walk in hospital room?: A Lot Help needed climbing 3-5 steps with a railing? : A Lot 6 Click Score: 13    End of Session Equipment Utilized During Treatment: Gait belt Activity Tolerance: Patient tolerated treatment well Patient left: in chair;with call bell/phone within reach;with chair alarm set Nurse Communication: Mobility status PT Visit Diagnosis: Unsteadiness on feet (R26.81);Difficulty in walking, not elsewhere classified (R26.2);Other symptoms and signs involving the nervous system (R29.898)     Time: 6045-40981147-1222 PT Time Calculation (min) (ACUTE ONLY): 35 min  Charges:  $Therapeutic Activity: 23-37 mins                     Gladys DammeBrittany Hanif Radin, PT, DPT  Acute Rehabilitation Services  Pager: [phone]  [phone] Office: [phone]    Lehman PromBrittany S Corbyn Wildey 07/30/2018, 1:18 PM

## 2018-07-30 NOTE — Progress Notes (Signed)
  Speech Language Pathology Treatment: Dysphagia  Patient Details Name: [name] Frank MRN: [id number] DOB: October 05, 1950 Today's Date: 07/30/2018 Time: 0801-0829 SLP Time Calculation (min) (ACUTE ONLY): 28 min  Assessment / Plan / Recommendation Clinical Impression  Pt is impulsive today - complains of leg pain but initially denied need for medications.  He needed total cues to sit fully upright for po.  Able to self feed but states he is "not hungry" and only consumed few bites of french toast and water.  Baseline cough x1 - productive to secretions but no indication of aspiration with po intake.  Pt did have mild oral residuals and benefited from verbal cue to clear with liquids.  In addition, ? Decreased left lower labial sensation as he demonstrated residuals without awareness.  Recommend advance diet to regular/thin consistency with intermittent assist.  Will follow up x1 more due to pt later (1030 per RN note) having problems swallowing medications - suspect cognitive based.     HPI HPI: Pt is a 68 year-old male who presented with AMS.  Found to have hydrocephalus and concern for seizures.  Pt with recent admission with a laceration on the back of his scalp and questionable LOC. CT of the head showed acute subdural hematoma along the left cerebral convexity measuring up to 11 mm in thickness. Mild cerebral mass effect. Patchy subarachnoid hemorrhage with a traumatic pattern.  Pt did not pass [facility] swallow screen and SLP ordered.  Imaging showed left frontoparietal SDH. EEG 07/29/2018 showed left hemispheric slowing with rare left temporal sharp waves suggestive of an epileptogenic potential. 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  Speech/swallow treatment/evaluation ordered.  Per neurology report to pt today, pt was helping family do taxes prior to admission.        SLP Plan  Continue with current plan of  care(pt demonstrated increased difficulty after session, will follow up x1 more, suspect cognitive based)  Patient needs continued Speech Lanaguage Pathology Services    Recommendations  Diet recommendations: Regular;Thin liquid Liquids provided via: Cup;Straw Medication Administration: Whole meds with puree Supervision: Patient able to self feed Compensations: Small sips/bites;Slow rate;Minimize environmental distractions;Lingual sweep for clearance of pocketing Postural Changes and/or Swallow Maneuvers: Seated upright 90 degrees;Upright 30-60 min after meal                Oral Care Recommendations: Oral care QID Follow up Recommendations: Inpatient Rehab SLP Visit Diagnosis: Aphasia (R47.01);Cognitive communication deficit (R41.841) Plan: Continue with current plan of care(pt demonstrated increased difficulty after session, will follow up x1 more, suspect cognitive based)       GO              Donavan Burnet, MS [facility] SLP Acute Rehab Services Pager [phone] Office [phone]   Chales Abrahams 07/30/2018, 10:48 AM

## 2018-07-30 NOTE — Progress Notes (Signed)
Pt given meds whole with puree. Pt pocketing meds in mouth. Has clenched teeth when asked to open to visualize if meds swallowed. Unable to safety administer rest of meds. Unknown which  meds were taken d/t puree mix with meds.

## 2018-07-30 NOTE — Evaluation (Signed)
Speech Language Pathology Evaluation Patient Details Name: [name] Frank MRN: [id number] DOB: 04-26-1950 Today's Date: 07/30/2018 Time: 0830-0901 SLP Time Calculation (min) (ACUTE ONLY): 31 min  Problem List:  Patient Active Problem List   Diagnosis Date Noted  . Hydrocephalus in adult [facility]) 07/29/2018  . Hydrocephalus (HCC) 07/28/2018  . Mild renal insufficiency 07/28/2018  . Paroxysmal atrial fibrillation (HCC) 07/28/2018  . Subdural hematoma (HCC) 07/14/2018  . Hypertension, essential 01/16/2018  . Controlled type 2 diabetes mellitus without complication, without long-term current use of insulin (HCC) 01/16/2018  . Coronary artery disease involving native heart without angina pectoris 01/16/2018  . Diabetes mellitus type II, non insulin dependent (HCC) 01/13/2011   Past Medical History:  Past Medical History:  Diagnosis Date  . Diabetes mellitus   . Heart disease   . Hyperlipemia   . Hypertension    Past Surgical History:  Past Surgical History:  Procedure Laterality Date  . catarracts    . OTHER SURGICAL HISTORY  2016   HEART STENT  . TONSILLECTOMY     HPI:  Pt is a 68 year-old male who presented with AMS.  Found to have hydrocephalus and concern for seizures.  Pt with recent admission with a laceration on the back of his scalp and questionable LOC. CT of the head showed acute subdural hematoma along the left cerebral convexity measuring up to 11 mm in thickness. Mild cerebral mass effect. Patchy subarachnoid hemorrhage with a traumatic pattern.  Pt did not pass [facility] swallow screen and SLP ordered.  Imaging showed left frontoparietal SDH. EEG 07/29/2018 showed left hemispheric slowing with rare left temporal sharp waves suggestive of an epileptogenic potential. 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  Speech/swallow treatment/evaluation ordered.  Per neurology report to pt today, pt  was helping family do taxes prior to admission.     Assessment / Plan / Recommendation Clinical Impression  Patient today presents with significant cognitive linguistic deficits characterized by decreased basic problem solving skills, sustained attention, dysfluent expression with paraphasias and impaired memory skills.  He also demonstrates ongoing perseveration x2 during session.  Pt was oriented to self and location only , not date nor situation.  He did not recall what the neurologist had told him within 3 minutes of the doctor leaving.  SLP wrote on whiteboard for pt but after 20 minutes he did not recall to check board for information.  Pt is significantly different than he was during recent hospitalization and will benefit from skilled SlP for cognitive linguistic skills.  Pt agreeable to plan.      SLP Assessment  SLP Recommendation/Assessment: Patient needs continued Speech Lanaguage Pathology Services SLP Visit Diagnosis: Aphasia (R47.01);Cognitive communication deficit (R41.841)    Follow Up Recommendations  Inpatient Rehab    Frequency and Duration min 2x/week  2 weeks      SLP Evaluation Cognition  Overall Cognitive Status: Impaired/Different from baseline Orientation Level: Oriented to person;Oriented to place;Disoriented to time;Disoriented to situation Attention: Sustained Focused Attention: Appears intact Sustained Attention: Impaired Sustained Attention Impairment: Functional basic;Verbal basic(pt easily distracted by discomfort from leg pain, decreased sustained attention to less than 1 minute during session) Memory: Impaired Memory Impairment: Storage deficit;Retrieval deficit;Decreased short term memory;Decreased recall of new information(pt did not recall information neurologist had shared with him within 3 minutes after leaving despite SLP verbal cues) Decreased Short Term Memory: Verbal basic(see above) Awareness: Impaired Awareness Impairment: Intellectual  impairment Problem Solving:  Impaired Problem Solving Impairment: Functional basic(to locate call bell and contact RN for pain medicine) Executive Function: (pt is not at this level at this time) Behaviors: Restless;Perseveration;Impulsive Safety/Judgment: Impaired       Comprehension  Auditory Comprehension Overall Auditory Comprehension: Impaired Yes/No Questions: Not tested Commands: Impaired One Step Basic Commands: 75-100% accurate Two Step Basic Commands: 75-100% accurate Multistep Basic Commands: 50-74% accurate Conversation: Simple Interfering Components: Attention;Processing speed;Working memory EffectiveTechniques: Stressing Media plannerwords;Repetition Visual Recognition/Discrimination Discrimination: Not tested Reading Comprehension Reading Status: Impaired Word level: Impaired Sentence Level: Impaired Paragraph Level: Not tested Functional Environmental (signs, name badge): Impaired Interfering Components: Processing time;Attention(suspect visual scanning issues and ? visual acuity)    Expression Expression Primary Mode of Expression: Verbal Verbal Expression Overall Verbal Expression: Impaired Initiation: Impaired Level of Generative/Spontaneous Verbalization: Sentence Repetition: Impaired Level of Impairment: Sentence level Naming: Impairment Confrontation: Impaired Other Naming Comments: 2/3 pictures of animals named correctly, 5/5 simple items named correctly Verbal Errors: Perseveration;Aware of errors;Phonemic paraphasias Pragmatics: Impairment Impairments: Abnormal affect Interfering Components: Attention Non-Verbal Means of Communication: Gestures Other Verbal Expression Comments: pt gestured to request to be raised up in bed Written Expression Dominant Hand: Right Written Expression: Not tested   Oral / Motor  Oral Motor/Sensory Function Overall Oral Motor/Sensory Function: Mild impairment Facial ROM: (left eye decreased opening, pt states this is  normal) Facial Symmetry: Abnormal symmetry left(left eye decreased opening) Facial Strength: Within Functional Limits Facial Sensation: (dnt) Lingual ROM: Within Functional Limits Lingual Symmetry: Within Functional Limits Lingual Strength: Other (Comment)(mildly impaired, pt dysarthric) Lingual Sensation: (dnt) Velum: Within Functional Limits Mandible: Within Functional Limits Motor Speech Overall Motor Speech: Appears within functional limits for tasks assessed Respiration: Impaired Phonation: Low vocal intensity Resonance: Within functional limits Intelligibility: Intelligible Motor Planning: Witnin functional limits Motor Speech Errors: Not applicable Effective Techniques: Increased vocal intensity   GO                    Chales AbrahamsKimball, Anye Brose Ann 07/30/2018, 10:45 AM   Donavan Burnetamara Anijah Spohr, MS [facility] SLP Acute Rehab Services Pager [phone] Office [phone]

## 2018-07-30 NOTE — Progress Notes (Signed)
PROGRESS NOTE    [name] Frank  DUK:[id number] DOB: 1950-06-11 DOA: 07/28/2018 PCP: Darreld Mclean, MD    Brief Narrative: 68 year old with past medical history significant for hypertension, diabetes type 2, coronary artery disease, paroxysmal A. fib and recent admission with traumatic subarachnoid hemorrhage and subdural hematoma now presenting to the emergency department for evaluation of right facial droop and speech difficulty.  Patient was discharged from the hospital on 07/22/2018 after conservative management of traumatic subarachnoid hemorrhage, diagnosed with paroxysmal A. fib during that admission and restarted on aspirin.  He had been doing well after returning home, helped his son with his taxes and was increased.  On third 3 days prior to admission he became lethargic and had some transient confusion.  His mentation appears to fluctuate.  He was noted to be more lethargic the day of admission and developed acute onset of right facial droop, some slurred speech and then stopped speaking. Evaluation in the ED blood pressure was 90/53.  EKG with sinus rhythm rate 55.  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  Neurology was consulted in the ED and is recommending CTA head and neck and MRI.  Neurosurgery has been also consulted.     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 -MRI of the brain not acute or stroke redemonstrate 10 mm left-sided SDH appears to have some local mass-effect and sulcal flattening and a stable hydrocephalus. -EEG with left hemispheric slowing, most prominent in the left temporal region, consistent with the patient history of left SDH.  Also noted rare left temporal sharp waves suggestive of epileptogenic  Potential. -Dr. Rory Percy started patient on [place] for possible seizure.  - I have change keppra to IV BID, patient with some issues this am pocketing meds in mouth.  -Per neurosurgery and neurology likely focal epileptiform activity secondary to hematoma breakdown products.  Unless seizure becomes medically refractory nonsurgically elevation indicated.   2-paroxysmal A. Fib Sinus rhythm on admission. Not on anticoagulation due to recent [facility].  Continue with diltiazem and metoprolol. I have changed Cardizem to short acting to be able to be crushed  Hypertension: Continue with diltiazem and metoprolol as tolerated.  Hold losartan.  Mild AKI/acidosis Continue with IV fluids.  Type 2 diabetes: Hold glipizide and Invokana met on admission.  Continue with sliding scale insulin.   Large left side hydrocele;  Mild redness. He was started on Augmentin. Continue for now.  Needs to follow up with urology PRN.      Estimated body mass index is 32.3 kg/m as calculated from the following:   Height as of this encounter: 5' 9"  (1.753 m).   Weight as of this encounter: 99.2 kg.   DVT prophylaxis: SCDs Code Status: Full code Family Communication: Family updated today by neurology Disposition Plan: We will consult CIR, mainly in the hospital to monitor response to [place].  Also need to make  sure that patient is able to tolerate oral medications  Consultants:   Neurosurgery  Neurology   Procedures:  EEG;  with left  hemispheric slowing, most prominent in the left temporal region, consistent with the patient history of left SDH.  Also noted rare left temporal sharp waves suggestive of epileptogenic  Potential.     Antimicrobials:   Amoxicillin   Subjective: He is alert, he follows command.  Still not very talkative but per report patient is more alert today He was pocketing his medication in his mouth today.  Objective: Vitals:   07/30/18 0331 07/30/18 0500 07/30/18 0730 07/30/18 0940  BP: (!) 119/54  125/62   Pulse: (!) 53  (!) 56 68  Resp: 17  17   Temp: 98.4 F (36.9 C)  98.7 F (37.1 C)   TempSrc: Oral  Axillary   SpO2: 98%  98%   Weight:  99.2 kg    Height:        Intake/Output Summary (Last 24 hours) at 07/30/2018 1041 Last data filed at 07/30/2018 0332 Gross per 24 hour  Intake --  Output 1200 ml  Net -1200 ml   Filed Weights   07/28/18 2133 07/29/18 0500 07/30/18 0500  Weight: 95.8 kg 99 kg 99.2 kg    Examination:  General exam: Appears calm and comfortable  Respiratory system: Clear to auscultation. Respiratory effort normal. Cardiovascular system: S1 & S2 heard, RRR. No JVD, murmurs, rubs, gallops or clicks. No pedal edema. Gastrointestinal system: Abdomen is nondistended, soft and nontender. No organomegaly or masses felt. Normal bowel sounds heard. Central nervous system: Alert follows some commands Extremities: Symmetric 5 x 5 power. Skin: No rashes, lesions or ulcers    Data Reviewed: I have personally reviewed following labs and imaging studies  CBC: Recent Labs  Lab 07/28/18 1839 07/28/18 1842 07/28/18 2052 07/29/18 0420 07/30/18 0416  WBC 13.0*  --   --  7.2 6.2  NEUTROABS 10.6*  --   --  5.2  --   HGB 13.7 13.9 12.2* 13.3 13.2  HCT 42.5 41.0 36.0* 40.5 40.8  MCV 91.0  --   --  89.2 89.9  PLT 313  --   --  269 789   Basic Metabolic Panel: Recent Labs  Lab 07/28/18 1839 07/28/18 1842 07/28/18 2052 07/29/18 0420 07/30/18 0416  NA 133* 131*  132* 135 137  K 4.6 4.5 4.6 4.4 4.2  CL 100 103  --  102 103  CO2 13*  --   --  18* 18*  GLUCOSE 184* 181*  --  70 91  BUN 21 20  --  20 17  CREATININE 1.25* 0.70  --  1.03 0.89  CALCIUM 9.1  --   --  8.9 8.7*   GFR: Estimated Creatinine Clearance: 96.1 mL/min (by C-G formula based on SCr of 0.89 mg/dL). Liver Function Tests: Recent Labs  Lab 07/28/18 1839  AST 16  ALT 16  ALKPHOS 103  BILITOT 1.8*  PROT 6.9  ALBUMIN 3.5   No results for input(s): LIPASE, AMYLASE in the last 168 hours. No results for input(s): AMMONIA in the last 168 hours. Coagulation Profile: Recent Labs  Lab 07/28/18 1839  INR 1.2   Cardiac Enzymes: No results for input(s): CKTOTAL, CKMB, CKMBINDEX, TROPONINI in the last 168 hours. BNP (last 3 results) No results for input(s): PROBNP in the last 8760 hours. HbA1C: No results for input(s): HGBA1C in the last 72 hours. CBG: Recent Labs  Lab 07/29/18 1546 07/29/18  1938 07/29/18 2338 07/30/18 0331 07/30/18 0753  GLUCAP 117* 98 82 89 105*   Lipid Profile: No results for input(s): CHOL, HDL, LDLCALC, TRIG, CHOLHDL, LDLDIRECT in the last 72 hours. Thyroid Function Tests: No results for input(s): TSH, T4TOTAL, FREET4, T3FREE, THYROIDAB in the last 72 hours. Anemia Panel: No results for input(s): VITAMINB12, FOLATE, FERRITIN, TIBC, IRON, RETICCTPCT in the last 72 hours. Sepsis Labs: No results for input(s): PROCALCITON, LATICACIDVEN in the last 168 hours.  Recent Results (from the past 240 hour(s))  SARS Coronavirus 2 (CEPHEID - Performed in [place] hospital lab), Hosp Order     Status: None   Collection Time: 07/28/18  8:23 PM  Result Value Ref Range Status   SARS Coronavirus 2 NEGATIVE NEGATIVE Final    Comment: (NOTE) If result is NEGATIVE SARS-CoV-2 target nucleic acids are NOT DETECTED. The SARS-CoV-2 RNA is generally detectable in upper and lower  respiratory specimens during the acute phase of infection. The lowest  concentration  of SARS-CoV-2 viral copies this assay can detect is 250  copies / mL. A negative result does not preclude SARS-CoV-2 infection  and should not be used as the sole basis for treatment or other  patient management decisions.  A negative result may occur with  improper specimen collection / handling, submission of specimen other  than nasopharyngeal swab, presence of viral mutation(s) within the  areas targeted by this assay, and inadequate number of viral copies  (<250 copies / mL). A negative result must be combined with clinical  observations, patient history, and epidemiological information. If result is POSITIVE SARS-CoV-2 target nucleic acids are DETECTED. The SARS-CoV-2 RNA is generally detectable in upper and lower  respiratory specimens dur ing the acute phase of infection.  Positive  results are indicative of active infection with SARS-CoV-2.  Clinical  correlation with patient history and other diagnostic information is  necessary to determine patient infection status.  Positive results do  not rule out bacterial infection or co-infection with other viruses. If result is PRESUMPTIVE POSTIVE SARS-CoV-2 nucleic acids MAY BE PRESENT.   A presumptive positive result was obtained on the submitted specimen  and confirmed on repeat testing.  While 2019 novel coronavirus  (SARS-CoV-2) nucleic acids may be present in the submitted sample  additional confirmatory testing may be necessary for epidemiological  and / or clinical management purposes  to differentiate between  SARS-CoV-2 and other Sarbecovirus currently known to infect humans.  If clinically indicated additional testing with an alternate test  methodology [phone]) is advised. The SARS-CoV-2 RNA is generally  detectable in upper and lower respiratory sp ecimens during the acute  phase of infection. The expected result is Negative. Fact Sheet for Patients:  StrictlyIdeas.no Fact Sheet for Healthcare  Providers: BankingDealers.co.za This test is not yet approved or cleared by the [place] FDA and has been authorized for detection and/or diagnosis of SARS-CoV-2 by FDA under an Emergency Use Authorization (EUA).  This EUA will remain in effect (meaning this test can be used) for the duration of the COVID-19 declaration under Section 564(b)(1) of the Act, 21 U.S.C. section 360bbb-3(b)(1), unless the authorization is terminated or revoked sooner. Performed at [facility], [place] [address] [place], [place] [address]          Radiology Studies: Ct Angio Head W Or Wo Contrast  Result Date: 07/28/2018 CLINICAL DATA:  68 y/o M; left-sided facial droop. Progressive somnolence. EXAM: CT ANGIOGRAPHY HEAD AND NECK TECHNIQUE: Multidetector CT imaging of the  head and neck was performed using the standard protocol during bolus administration of intravenous contrast. Multiplanar CT image reconstructions and MIPs were obtained to evaluate the vascular anatomy. Carotid stenosis measurements (when applicable) are obtained utilizing NASCET criteria, using the distal internal carotid diameter as the denominator. CONTRAST:  50m OMNIPAQUE IOHEXOL 350 MG/ML SOLN COMPARISON:  07/28/2018 CT of the head. FINDINGS: CTA NECK FINDINGS Aortic arch: Standard branching. Imaged portion shows no evidence of aneurysm or dissection. No significant stenosis of the major arch vessel origins. Mild aortic calcific atherosclerosis. Right carotid system: No evidence of dissection, stenosis (50% or greater) or occlusion. Mild non stenotic calcific atherosclerosis of carotid bifurcation. Left carotid system: No evidence of dissection, stenosis (50% or greater) or occlusion. Mild non stenotic calcific atherosclerosis of carotid bifurcation. Vertebral arteries: Left V1-V2 proximal V3 are occluded. The left distal V3 and V4 are diffusely irregular with attenuated enhancement and reversal of flow from the  vertebrobasilar junction. There appears to be a cauda above the left C2 foramen transversarium (series 7, image 86). Widely patent and dominant right vertebral artery. Skeleton: No acute finding. Other neck: Negative. Upper chest: Right lung apex calcified granuloma. Review of the MIP images confirms the above findings CTA HEAD FINDINGS Anterior circulation: No significant stenosis, proximal occlusion, aneurysm, or vascular malformation. Posterior circulation: Left V1-V2 and proximal V3 are occluded, no visible dissection flap or wall hematoma. The left distal V3 and V4 are diffusely irregular with attenuated enhancement and reversal of flow from the vertebrobasilar junction. Widely patent and dominant right vertebral artery. There appears to be a cauda above the left C2 foramen transversarium (series 7, image 86). Tandem segments of mild-to-moderate stenosis in the left P2 and P3 segments. Large PICA and SCA noted bilaterally. Otherwise no significant stenosis, proximal occlusion, aneurysm, or vascular malformation in the posterior circulation. Venous sinuses: As permitted by contrast timing, patent. Anatomic variants: Fetal left PCA. No right posterior communicating artery identified, likely hypoplastic or absent. Probable diminutive anterior communicating artery. Review of the MIP images confirms the above findings IMPRESSION: 1. Nonspecific left V1-V2 and proximal V3 occlusion. There appears to be a cutoff above the left C2 foramen transversarium suggesting a recent thromboembolic occlusion or dissection. 2. Left distal V3 and V4 are diffusely irregular with attenuated enhancement and reversal of flow from vertebrobasilar junction. 3. Patent carotid systems and dominant right vertebral artery without dissection, aneurysm, or hemodynamically significant stenosis utilizing NASCET criteria. 4. Mild calcific atherosclerosis of aortic arch and carotid bifurcations. CTA HEAD:. 1. Patent Circle of Willis. No  intracranial large vessel occlusion, aneurysm, or vascular malformation identified. 2. Tandem segments of mild-to-moderate stenosis in the left P2 and P3 segments. 3. Left V4 is diffusely irregular and attenuated with reversal of flow from the vertebrobasilar junction. 4. Fetal left PCA. These results were called by telephone at the time of interpretation on 07/28/2018 at 7:15 pm to Dr. AAmie Portland, who verbally acknowledged these results. Electronically Signed   By: LKristine GarbeM.D.   On: 07/28/2018 19:18   Ct Head Wo Contrast  Result Date: 07/29/2018 CLINICAL DATA:  Right facial droop. EXAM: CT HEAD WITHOUT CONTRAST TECHNIQUE: Contiguous axial images were obtained from the base of the skull through the vertex without intravenous contrast. COMPARISON:  CT scan of Jul 28, 2018. FINDINGS: Brain: Grossly stable left frontal parietal subdural fluid collection is noted of mixed density. Biventricular diameter is measured at 52 mm which is not significantly changed compared to prior exam. This is concerning for  developing communicating hydrocephalus. Stable amount of intraventricular hemorrhage is noted in the right posterior horn. Stable appearance of subarachnoid hemorrhage is seen in right occipital lobe. No new hemorrhage or infarction is noted. Mild chronic ischemic white matter disease is noted. Vascular: No hyperdense vessel or unexpected calcification. Skull: Normal. Negative for fracture or focal lesion. Sinuses/Orbits: No acute finding. Other: Staples are seen involving the right posterior parietal scalp. IMPRESSION: Grossly stable left frontal parietal subdural fluid collection is noted. Stable ventricular dilatation is noted concerning for communicating hydrocephalus. Stable amount of intraventricular hemorrhage is noted in the right posterior horn. Stable appearance of small amount of subarachnoid hemorrhage is seen in right occipital lobe. No new abnormality is noted compared to prior  exam. Electronically Signed   By: Marijo Conception M.D.   On: 07/29/2018 09:39   Ct Angio Neck W Or Wo Contrast  Result Date: 07/28/2018 CLINICAL DATA:  68 y/o M; left-sided facial droop. Progressive somnolence. EXAM: CT ANGIOGRAPHY HEAD AND NECK TECHNIQUE: Multidetector CT imaging of the head and neck was performed using the standard protocol during bolus administration of intravenous contrast. Multiplanar CT image reconstructions and MIPs were obtained to evaluate the vascular anatomy. Carotid stenosis measurements (when applicable) are obtained utilizing NASCET criteria, using the distal internal carotid diameter as the denominator. CONTRAST:  32m OMNIPAQUE IOHEXOL 350 MG/ML SOLN COMPARISON:  07/28/2018 CT of the head. FINDINGS: CTA NECK FINDINGS Aortic arch: Standard branching. Imaged portion shows no evidence of aneurysm or dissection. No significant stenosis of the major arch vessel origins. Mild aortic calcific atherosclerosis. Right carotid system: No evidence of dissection, stenosis (50% or greater) or occlusion. Mild non stenotic calcific atherosclerosis of carotid bifurcation. Left carotid system: No evidence of dissection, stenosis (50% or greater) or occlusion. Mild non stenotic calcific atherosclerosis of carotid bifurcation. Vertebral arteries: Left V1-V2 proximal V3 are occluded. The left distal V3 and V4 are diffusely irregular with attenuated enhancement and reversal of flow from the vertebrobasilar junction. There appears to be a cauda above the left C2 foramen transversarium (series 7, image 86). Widely patent and dominant right vertebral artery. Skeleton: No acute finding. Other neck: Negative. Upper chest: Right lung apex calcified granuloma. Review of the MIP images confirms the above findings CTA HEAD FINDINGS Anterior circulation: No significant stenosis, proximal occlusion, aneurysm, or vascular malformation. Posterior circulation: Left V1-V2 and proximal V3 are occluded, no visible  dissection flap or wall hematoma. The left distal V3 and V4 are diffusely irregular with attenuated enhancement and reversal of flow from the vertebrobasilar junction. Widely patent and dominant right vertebral artery. There appears to be a cauda above the left C2 foramen transversarium (series 7, image 86). Tandem segments of mild-to-moderate stenosis in the left P2 and P3 segments. Large PICA and SCA noted bilaterally. Otherwise no significant stenosis, proximal occlusion, aneurysm, or vascular malformation in the posterior circulation. Venous sinuses: As permitted by contrast timing, patent. Anatomic variants: Fetal left PCA. No right posterior communicating artery identified, likely hypoplastic or absent. Probable diminutive anterior communicating artery. Review of the MIP images confirms the above findings IMPRESSION: 1. Nonspecific left V1-V2 and proximal V3 occlusion. There appears to be a cutoff above the left C2 foramen transversarium suggesting a recent thromboembolic occlusion or dissection. 2. Left distal V3 and V4 are diffusely irregular with attenuated enhancement and reversal of flow from vertebrobasilar junction. 3. Patent carotid systems and dominant right vertebral artery without dissection, aneurysm, or hemodynamically significant stenosis utilizing NASCET criteria. 4. Mild calcific atherosclerosis of aortic  arch and carotid bifurcations. CTA HEAD:. 1. Patent Circle of Willis. No intracranial large vessel occlusion, aneurysm, or vascular malformation identified. 2. Tandem segments of mild-to-moderate stenosis in the left P2 and P3 segments. 3. Left V4 is diffusely irregular and attenuated with reversal of flow from the vertebrobasilar junction. 4. Fetal left PCA. These results were called by telephone at the time of interpretation on 07/28/2018 at 7:15 pm to Dr. Amie Portland , who verbally acknowledged these results. Electronically Signed   By: Kristine Garbe M.D.   On: 07/28/2018 19:18     Mr Brain Wo Contrast  Result Date: 07/29/2018 CLINICAL DATA:  Continued surveillance subdural. Paroxysmally episodes of aphasia. EXAM: MRI HEAD WITHOUT CONTRAST TECHNIQUE: Multiplanar, multiecho pulse sequences of the brain and surrounding structures were obtained without intravenous contrast. COMPARISON:  CT head yesterday.  CTA head neck yesterday. FINDINGS: Brain: No acute infarction, parenchymal hemorrhage, or intra-axial mass lesion. 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 Extra-axial hematoma, LEFT subdural, heterogeneous signal intensity, with moderate mass effect on the LEFT hemisphere, measures 10 mm thickness on coronal series 11, image 11. Vascular: Flow voids in the carotid, basilar, and RIGHT vertebral arteries are preserved. Absent flow void/slow or reversed flow LEFT vertebral; please see CTA report for additional detail regarding flow reversal, and extracranial disease. Skull and upper cervical spine: No fracture. Sinuses/Orbits: Unremarkable. Other: None. 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 No evidence for posterior circulation or hemispheric infarct, despite significant disease in both vertebrals on CTA. 10 mm thick LEFT subdural hematoma, with mass effect on the LEFT hemisphere. No midline shift due to pre-existing atrophy, but flattening of the cortical sulci is observed. Electronically Signed   By: Staci Righter M.D.   On: 07/29/2018 13:42   US Scrotum  Result Date: 07/29/2018 CLINICAL DATA:  Scrotal edema x1 day. EXAM: ULTRASOUND OF SCROTUM TECHNIQUE: Complete ultrasound examination of the testicles, epididymis, and other scrotal structures was performed. COMPARISON:  None. FINDINGS: Right testicle Measurements: 4.2 x 2.2 x 3.3 cm. No mass or microlithiasis visualized. Left testicle Measurements: 4.7 x 2.8 x 2.9 cm. No mass or microlithiasis visualized. Right epididymis:  Normal in  size and appearance. Left epididymis:  Normal in size and appearance. Hydrocele: There is a very large left-sided hydrocele. There is a small to moderate sized right-sided hydrocele. Varicocele:  None visualized. IMPRESSION: 1. Large left-sided hydrocele. Small to moderate size right-sided hydrocele. 2. Otherwise, no additional significant sonographic abnormality detected. Electronically Signed   By: Constance Holster M.D.   On: 07/29/2018 17:02   Ct Head Code Stroke Wo Contrast  Result Date: 07/28/2018 CLINICAL DATA:  Code stroke. LEFT-sided facial droop. Progressive somnolence. Last seen normal 1745 hours. EXAM: CT HEAD WITHOUT CONTRAST TECHNIQUE: Contiguous axial images were obtained from the base of the skull through the vertex without intravenous contrast. COMPARISON:  Multiple priors, most recent 07/17/2018 FINDINGS: Brain: No acute stroke or parenchymal hemorrhage. No intracranial intra-axial mass lesion. Developing hydrocephalus, biventricular diameter now 52 mm, as compared with 46 mm on 05/07. LEFT frontoparietal subdural collection is also increased, now 8 mm as compared with 4-5 mm previous. The attenuation of the subdural collection is now becoming more isodense, with some slight hyperdense residual collection. Intraventricular blood is improving but still layering. Subarachnoid blood is resolving. Vascular: No hyperdense vessel or unexpected calcification. Skull: Normal. Negative for fracture or focal lesion. Sinuses/Orbits: Negative Other: None ASPECTS ([place] Stroke Program Early CT Score) - Ganglionic level infarction (caudate, lentiform nuclei, internal capsule, insula,  M1-M3 cortex): 7 - Supraganglionic infarction (M4-M6 cortex): 3 Total score (0-10 with 10 being normal): 10 IMPRESSION: 1. 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 Biventricular diameter is 52 mm, which is significant. Resolving subarachnoid blood. 2. Increasing size  of LEFT frontoparietal subdural collection, now 8 mm. 3. ASPECTS is 10. * These results were called by telephone at the time of interpretation on 07/28/2018 at 6:50 pm to Dr. Rory Percy , who verbally acknowledged these results. Electronically Signed   By: Staci Righter M.D.   On: 07/28/2018 18:56        Scheduled Meds:  amoxicillin-clavulanate  1 tablet Oral Q12H   atorvastatin  80 mg Oral q1800   diltiazem  360 mg Oral Daily   insulin aspart  0-9 Units Subcutaneous Q4H   levETIRAcetam  500 mg Oral BID   metoprolol succinate  100 mg Oral Daily   sodium chloride flush  3 mL Intravenous Once   sodium chloride flush  3 mL Intravenous Q12H   Continuous Infusions:   LOS: 1 day    Time spent: 35  minutes    Elmarie Shiley, MD [facility] Pager [phone]  If 7PM-7AM, please contact night-coverage [email or web address] Password [email or web address] [facility] 07/30/2018, 10:41 AM

## 2018-07-30 NOTE — Progress Notes (Signed)
Obtained admission profile information from wife via telephone.

## 2018-07-30 NOTE — Progress Notes (Addendum)
Neurology Progress Note   S:// Seen and examined.  No acute complaints. EEG showed left hemispheric slowing with rare left temporal sharp waves suggestive of an epileptogenic potential.    O:// Current vital signs: BP 125/62 (BP Location: Right Arm)   Pulse (!) 56 Comment: RN notified  Temp 98.7 F (37.1 C) (Axillary)   Resp 17   Ht 5\' 9"  (1.753 m)   Wt 99.2 kg   SpO2 98%   BMI 32.30 kg/m  Vital signs in last 24 hours: Temp:  [98 F (36.7 C)-98.7 F (37.1 C)] 98.7 F (37.1 C) (05/20 0730) Pulse Rate:  [53-62] 56 (05/20 0730) Resp:  [17-22] 17 (05/20 0730) BP: (107-125)/(54-79) 125/62 (05/20 0730) SpO2:  [96 %-98 %] 98 % (05/20 0730) Weight:  [99.2 kg] 99.2 kg (05/20 0500) General: Awake alert in no distress HEENT: Normocephalic atraumatic CVS: S1-2 heard regular rhythm Respiratory: Clear to auscultation Neurological exam Patient is awake alert oriented x3 His speech is not dysarthric Attention concentration is mildly reduced which much improved from yesterday No evidence of aphasia Cranial nerves: Pupils equal round react light, extraocular movements intact, visual fields are full to threat, right facial weakness that was observed yesterday is much improved and nearly symmetric face today, auditory acuity mildly reduced bilaterally, tongue and palate midline. Motor exam: Mild right upper extremity drift but otherwise intact. Sensory exam: Intact light touch Coordination: Intact finger-nose-finger Gait testing deferred at this time  Medications  Current Facility-Administered Medications:  .  acetaminophen (TYLENOL) tablet 650 mg, 650 mg, Oral, Q6H PRN **OR** acetaminophen (TYLENOL) suppository 650 mg, 650 mg, Rectal, Q6H PRN, Opyd, Timothy S, MD .  amoxicillin-clavulanate (AUGMENTIN) 875-125 MG per tablet 1 tablet, 1 tablet, Oral, Q12H, Narda BondsNettey, Ralph A, MD, 1 tablet at 07/29/18 1704 .  atorvastatin (LIPITOR) tablet 80 mg, 80 mg, Oral, q1800, Opyd, Lavone Neriimothy S, MD, 80  mg at 07/29/18 1705 .  diltiazem (CARDIZEM CD) 24 hr capsule 360 mg, 360 mg, Oral, Daily, Opyd, Lavone Neriimothy S, MD, 360 mg at 07/29/18 1005 .  fentaNYL (SUBLIMAZE) injection 12.5-25 mcg, 12.5-25 mcg, Intravenous, Q2H PRN, Opyd, Timothy S, MD .  hydrALAZINE (APRESOLINE) injection 5 mg, 5 mg, Intravenous, Q4H PRN, Opyd, Timothy S, MD .  insulin aspart (novoLOG) injection 0-9 Units, 0-9 Units, Subcutaneous, Q4H, Opyd, Lavone Neriimothy S, MD, 2 Units at 07/29/18 1211 .  levETIRAcetam (KEPPRA) tablet 500 mg, 500 mg, Oral, BID, Milon DikesArora, Paetyn Pietrzak, MD .  metoprolol succinate (TOPROL-XL) 24 hr tablet 100 mg, 100 mg, Oral, Daily, Opyd, Lavone Neriimothy S, MD, 100 mg at 07/29/18 1005 .  ondansetron (ZOFRAN) tablet 4 mg, 4 mg, Oral, Q6H PRN **OR** ondansetron (ZOFRAN) injection 4 mg, 4 mg, Intravenous, Q6H PRN, Opyd, Lavone Neriimothy S, MD .  polyethylene glycol (MIRALAX / GLYCOLAX) packet 17 g, 17 g, Oral, Daily PRN, Opyd, Timothy S, MD .  sodium chloride flush (NS) 0.9 % injection 3 mL, 3 mL, Intravenous, Once, Opyd, Timothy S, MD .  sodium chloride flush (NS) 0.9 % injection 3 mL, 3 mL, Intravenous, Q12H, Opyd, Lavone Neriimothy S, MD, 3 mL at 07/29/18 2238 Labs CBC    Component Value Date/Time   WBC 6.2 07/30/2018 0416   RBC 4.54 07/30/2018 0416   HGB 13.2 07/30/2018 0416   HCT 40.8 07/30/2018 0416   PLT 222 07/30/2018 0416   MCV 89.9 07/30/2018 0416   MCH 29.1 07/30/2018 0416   MCHC 32.4 07/30/2018 0416   RDW 14.3 07/30/2018 0416   LYMPHSABS 0.9 07/29/2018 0420   MONOABS 0.9  07/29/2018 0420   EOSABS 0.1 07/29/2018 0420   BASOSABS 0.0 07/29/2018 0420    CMP     Component Value Date/Time   NA 137 07/30/2018 0416   K 4.2 07/30/2018 0416   CL 103 07/30/2018 0416   CO2 18 (L) 07/30/2018 0416   GLUCOSE 91 07/30/2018 0416   BUN 17 07/30/2018 0416   CREATININE 0.89 07/30/2018 0416   CALCIUM 8.7 (L) 07/30/2018 0416   PROT 6.9 07/28/2018 1839   ALBUMIN 3.5 07/28/2018 1839   AST 16 07/28/2018 1839   ALT 16 07/28/2018 1839   ALKPHOS  103 07/28/2018 1839   BILITOT 1.8 (H) 07/28/2018 1839   GFRNONAA >60 07/30/2018 0416   GFRAA >60 07/30/2018 0416     Imaging I have reviewed images in epic and the results pertinent to this consultation are: MRI brain reviewed-10 mm left-sided subdural with some mass-effect and sulcal flattening and stable hydrocephalus.  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  Assessment: 68 year old man with a recent left-sided subdural hematoma, medically managed, discharge, coming in with waxing waning mentation. Imaging with stable hematoma but mildly increased hydrocephalus, which can probably explain some increasing lethargy over the past few days.  Waxing and waning mental status could probably be explained by intermittent electrographic abnormalities-the EEG showed rare left hemispheric sharps and left hemispheric slowing-started on antiepileptics last night.  Today's exam consistent only with some mild attention deficit and mild right upper extremity drift-overall improved from presentation  Impression: Recent left-sided subdural hematoma Mild hydrocephalus Possible underlying intermittent seizure activity from the subdural  Recommendations: Continue Keppra 500 mg twice daily He was given a load of 1 g IV yesterday Maintain seizure precautions Due to the recent atrial fibrillation, he might require anticoagulation for ischemic stroke prevention but at this time with an acute/subacute subdural hematoma, anticoagulation should be held. He was started on antiplatelet-can be continued. Stat imaging of the brain should be performed if he has any decrease in his level of consciousness for concern of worsening of hydrocephalus or the subdural hematoma.   I had a detailed conversation with his wife over the phone this morning explaining my plan and rationale for initiating antiepileptics.  Neurology will be available as needed -please call with any questions   -- Milon Dikes,  MD [facility] Pager: [phone] If 7pm to 7am, please call on call as listed on AMION.

## 2018-07-30 NOTE — Progress Notes (Signed)
Neurosurgery Service Progress Note  Subjective: No acute events overnight.    Objective: Vitals:   07/29/18 1938 07/29/18 2336 07/30/18 0331 07/30/18 0500  BP: (!) 111/54 113/67 (!) 119/54   Pulse: (!) 59 61 (!) 53   Resp: 18 18 17    Temp: 98 F (36.7 C) 98.3 F (36.8 C) 98.4 F (36.9 C)   TempSrc: Oral Oral Oral   SpO2: 96% 98% 98%   Weight:    99.2 kg  Height:       Temp (24hrs), Avg:98.3 F (36.8 C), Min:98 F (36.7 C), Max:98.4 F (36.9 C)  CBC Latest Ref Rng & Units 07/30/2018 07/29/2018 07/28/2018  WBC 4.0 - 10.5 K/uL 6.2 7.2 -  Hemoglobin 13.0 - 17.0 g/dL 16.113.2 09.613.3 12.2(L)  Hematocrit 39.0 - 52.0 % 40.8 40.5 36.0(L)  Platelets 150 - 400 K/uL 222 269 -   BMP Latest Ref Rng & Units 07/30/2018 07/29/2018 07/28/2018  Glucose 70 - 99 mg/dL 91 70 -  BUN 8 - 23 mg/dL 17 20 -  Creatinine 0.450.61 - 1.24 mg/dL 4.090.89 8.111.03 -  Sodium 914135 - 145 mmol/L 137 135 132(L)  Potassium 3.5 - 5.1 mmol/L 4.2 4.4 4.6  Chloride 98 - 111 mmol/L 103 102 -  CO2 22 - 32 mmol/L 18(L) 18(L) -  Calcium 8.9 - 10.3 mg/dL 7.8(G8.7(L) 8.9 -    Intake/Output Summary (Last 24 hours) at 07/30/2018 0836 Last data filed at 07/30/2018 [id number] Gross per 24 hour  Intake 481.07 ml  Output 1200 ml  Net -718.93 ml    Current Facility-Administered Medications:  .  acetaminophen (TYLENOL) tablet 650 mg, 650 mg, Oral, Q6H PRN **OR** acetaminophen (TYLENOL) suppository 650 mg, 650 mg, Rectal, Q6H PRN, Opyd, Timothy S, MD .  amoxicillin-clavulanate (AUGMENTIN) 875-125 MG per tablet 1 tablet, 1 tablet, Oral, Q12H, Narda BondsNettey, Ralph A, MD, 1 tablet at 07/29/18 1704 .  atorvastatin (LIPITOR) tablet 80 mg, 80 mg, Oral, q1800, Opyd, Lavone Neriimothy S, MD, 80 mg at 07/29/18 1705 .  diltiazem (CARDIZEM CD) 24 hr capsule 360 mg, 360 mg, Oral, Daily, Opyd, Lavone Neriimothy S, MD, 360 mg at 07/29/18 1005 .  fentaNYL (SUBLIMAZE) injection 12.5-25 mcg, 12.5-25 mcg, Intravenous, Q2H PRN, Opyd, Timothy S, MD .  hydrALAZINE (APRESOLINE) injection 5 mg, 5 mg,  Intravenous, Q4H PRN, Opyd, Timothy S, MD .  insulin aspart (novoLOG) injection 0-9 Units, 0-9 Units, Subcutaneous, Q4H, Opyd, Lavone Neriimothy S, MD, 2 Units at 07/29/18 1211 .  levETIRAcetam (KEPPRA) tablet 500 mg, 500 mg, Oral, BID, Milon DikesArora, Ashish, MD .  metoprolol succinate (TOPROL-XL) 24 hr tablet 100 mg, 100 mg, Oral, Daily, Opyd, Lavone Neriimothy S, MD, 100 mg at 07/29/18 1005 .  ondansetron (ZOFRAN) tablet 4 mg, 4 mg, Oral, Q6H PRN **OR** ondansetron (ZOFRAN) injection 4 mg, 4 mg, Intravenous, Q6H PRN, Opyd, Timothy S, MD .  polyethylene glycol (MIRALAX / GLYCOLAX) packet 17 g, 17 g, Oral, Daily PRN, Opyd, Timothy S, MD .  sodium chloride flush (NS) 0.9 % injection 3 mL, 3 mL, Intravenous, Once, Opyd, Timothy S, MD .  sodium chloride flush (NS) 0.9 % injection 3 mL, 3 mL, Intravenous, Q12H, Opyd, Lavone Neriimothy S, MD, 3 mL at 07/29/18 2238   Physical Exam: Awake/alert, speech more fluent with globally decreased output, comprehension intact, mildly confused - thinks he already had surgery PERRL, EOMI, FS, Strength 5/5 x4, SILTx4, +mild RUE drift  Assessment & Plan: 68 y.o. man s/p fall w/ SDH now returning with aphasia. Levetiracetam started overnight with an improvement in his  exam, in comparison with my exam at admission.  -agree with Dr. Wilford Corner, likely focal epileptiform activity 2/2 hematoma breakdown products. Unless seizures become medically refractory, no surgical intervention indicated.   Jadene Pierini  07/30/18 8:36 AM

## 2018-07-30 NOTE — Progress Notes (Addendum)
Inpatient Rehab Admissions:  Inpatient Rehab Consult received.  I met with patient at the bedside for rehabilitation assessment and to discuss goals and expectations of an inpatient rehab admission.  He initially says he is not interested in rehab, but then later agrees.  I will need to contact wife to confirm what kind of support he will have at discharge and I will attempt to reach her today.   1206: I called number listed for wife in chart. No answer and no voicemail available. I will try to reach her again this afternoon.   1600: I was able to speak with pt's wife, [name].  She is interested in CIR stay, but concerned about insurance coverage and not sure she will be able to provide 24/7 supervision at discharge.  I will follow up with her tomorrow with insurance benefit information and hopefully have a decision from her at that time.  Signed: Shann Medal, PT, DPT Admissions Coordinator [phone] 07/30/18  11:39 AM

## 2018-07-31 LAB — CBC
HCT: 42.3 % (ref 39.0–52.0)
Hemoglobin: 14 g/dL (ref 13.0–17.0)
MCH: 29.3 pg (ref 26.0–34.0)
MCHC: 33.1 g/dL (ref 30.0–36.0)
MCV: 88.5 fL (ref 80.0–100.0)
Platelets: 239 10*3/uL (ref 150–400)
RBC: 4.78 MIL/uL (ref 4.22–5.81)
RDW: 13.9 % (ref 11.5–15.5)
WBC: 5.4 10*3/uL (ref 4.0–10.5)
nRBC: 0 % (ref 0.0–0.2)

## 2018-07-31 LAB — BASIC METABOLIC PANEL
Anion gap: 17 — ABNORMAL HIGH (ref 5–15)
BUN: 16 mg/dL (ref 8–23)
CO2: 17 mmol/L — ABNORMAL LOW (ref 22–32)
Calcium: 8.9 mg/dL (ref 8.9–10.3)
Chloride: 101 mmol/L (ref 98–111)
Creatinine, Ser: 1.03 mg/dL (ref 0.61–1.24)
GFR calc Af Amer: >60 mL/min (ref >60)
GFR calc non Af Amer: >60 mL/min (ref >60)
Glucose, Bld: 131 mg/dL — ABNORMAL HIGH (ref 70–99)
Potassium: 4.1 mmol/L (ref 3.5–5.1)
Sodium: 135 mmol/L (ref 135–145)

## 2018-07-31 LAB — GLUCOSE, CAPILLARY
Glucose-Capillary: 110 mg/dL — ABNORMAL HIGH (ref 70–99)
Glucose-Capillary: 115 mg/dL — ABNORMAL HIGH (ref 70–99)
Glucose-Capillary: 162 mg/dL — ABNORMAL HIGH (ref 70–99)
Glucose-Capillary: 119 mg/dL — ABNORMAL HIGH (ref 70–99)
Glucose-Capillary: 147 mg/dL — ABNORMAL HIGH (ref 70–99)
Glucose-Capillary: 129 mg/dL — ABNORMAL HIGH (ref 70–99)

## 2018-07-31 MED ORDER — DILTIAZEM HCL ER COATED BEADS 180 MG PO CP24
360.0000 mg | ORAL_CAPSULE | Freq: Every day | ORAL | Status: DC
  Administered 2018-07-31: 10:00:00 360 mg via ORAL
  Filled 2018-07-31 (×2): qty 2

## 2018-07-31 MED ORDER — LEVETIRACETAM 500 MG PO TABS
500.0000 mg | ORAL_TABLET | Freq: Two times a day (BID) | ORAL | Status: DC
  Administered 2018-07-31 – 2018-08-01 (×3): 500 mg via ORAL
  Filled 2018-07-31 (×3): qty 1

## 2018-07-31 MED ORDER — SODIUM CHLORIDE 0.9 % IV SOLN
INTRAVENOUS | Status: DC
  Administered 2018-07-31 – 2018-08-01 (×3): via INTRAVENOUS

## 2018-07-31 NOTE — Progress Notes (Signed)
  Speech Language Pathology Treatment: Cognitive-Linquistic  Patient Details Name: [name] Frank MRN: [id number] DOB: 25-May-1950 Today's Date: 07/31/2018 Time: 2080-2233 SLP Time Calculation (min) (ACUTE ONLY): 27 min  Assessment / Plan / Recommendation Clinical Impression  Pt reported that he did not sleep well last night initially stated he has not noticed any change in his language or cognition since admission. However, as the session progressed and he demonstrated increased difficulty with simpler tasks than were targeted during the previous admission, he stated, "I'm really worse now aren't I?" He was re-educated regarding his current deficits and the changes which have been noticed compared to his prior admission. Pt provided 5-8 items per category during divergent naming tasks with min-mod cues. He completed abstract reasoing tasks with 67% accuracy increasing to 83% accuracy with min. cues. A time management (calendar) activity was completed and pt achieved 40% accuracy increasing to 100% with mod-max. cues. Pt's cognitive-linguistic skills are notably more impaired compared to that which was demonstrated during his previous admission. SLP will continue to follow.    HPI HPI: Pt is a 68 year-old male who presented with AMS.  Found to have hydrocephalus and concern for seizures.  Pt with recent admission with a laceration on the back of his scalp and questionable LOC. CT of the head showed acute subdural hematoma along the left cerebral convexity measuring up to 11 mm in thickness. Mild cerebral mass effect. Patchy subarachnoid hemorrhage with a traumatic pattern.  Pt did not pass Yale swallow screen and SLP ordered.  Imaging showed left frontoparietal SDH. EEG 07/29/2018 showed left hemispheric slowing with rare left temporal sharp waves suggestive of an epileptogenic potential. 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  Speech/swallow treatment/evaluation ordered.  Per neurology report to pt today, pt was helping family do taxes prior to admission.        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 Vear Clock, MS, CCC-SLP Acute Rehabilitation Services Office number [phone] Pager [phone]               [name] Frank 07/31/2018, 10:59 AM

## 2018-07-31 NOTE — Progress Notes (Signed)
Inpatient Rehab Admissions Coordinator:   I have spoken to patient's wife, Stark Bray, and she is agreeable to cost of rehab with pt's insurance. She confirmed that she will be able to provide 24/7 supervision at discharge.  I hope to admit patient tomorrow pending bed availability.    Estill Dooms, PT, DPT Admissions Coordinator [phone] 07/31/18  11:53 AM

## 2018-07-31 NOTE — Progress Notes (Addendum)
PROGRESS NOTE    [name] Frank  EAV:[id number] DOB: August 28, 1950 DOA: 07/28/2018 PCP: Darreld Mclean, MD    Brief Narrative: 68 year old with past medical history significant for hypertension, diabetes type 2, coronary artery disease, paroxysmal A. fib and recent admission with traumatic subarachnoid hemorrhage and subdural hematoma now presenting to the emergency department for evaluation of right facial droop and speech difficulty.  Patient was discharged from the hospital on 07/22/2018 after conservative management of traumatic subarachnoid hemorrhage, diagnosed with paroxysmal A. fib during that admission and restarted on aspirin.  He had been doing well after returning home, helped his son with his taxes and was increased.  On third 3 days prior to admission he became lethargic and had some transient confusion.  His mentation appears to fluctuate.  He was noted to be more lethargic the day of admission and developed acute onset of right facial droop, some slurred speech and then stopped speaking. Evaluation in the ED blood pressure was 90/53.  EKG with sinus rhythm rate 55.  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  Neurology was consulted in the ED and is recommending CTA head and neck and MRI.  Neurosurgery has been also consulted.  -Per neurosurgery and neurology, patient presentation is likely from  focal epileptiform activity secondary to hematoma breakdown products.  Unless seizure becomes medically refractory nonsurgically elevation indicated.  Patient was a started on Keppra his mental status has improved.  He has been tolerating Keppra.      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 -Found to have developing communicating hydrocephalus on CT, likely secondary to recent subarachnoid and intraventricular bleeding. -MRI of the brain not acute or stroke redemonstrate 10 mm left-sided SDH appears to have some local mass-effect and sulcal flattening and a stable hydrocephalus. -EEG with left hemispheric slowing, most prominent in the left temporal region, consistent with the patient history of left SDH.  Also noted rare left temporal sharp waves suggestive of epileptogenic  Potential. -Dr. Rory Percy started patient on [place] for possible seizure.  -Per neurosurgery and neurology likely focal epileptiform activity secondary to hematoma breakdown products.  Unless seizure becomes medically refractory nonsurgically elevation indicated. -continue with Keppra./   2-Paroxysmal A. Fib Sinus rhythm on admission. Not on anticoagulation due to recent [facility].  Continue with diltiazem and metoprolol. Continue with long acting cardizem, now that patient is able to swallow pills better.   Hypertension: Continue with diltiazem and metoprolol as tolerated.  Hold losartan.  Mild AKI/acidosis Continue with IV fluids.  Type 2 diabetes: Hold glipizide and Invokana met on admission.  Continue with sliding scale insulin.   Large left side hydrocele;  Mild redness. He was started on Augmentin. Continue for now.  Needs to follow up with urology PRN.      Estimated body mass index is 32.39 kg/m as calculated from the following:   Height as of this encounter: 5' 9"  (1.753 m).   Weight as of this encounter: 99.5 kg.   DVT  prophylaxis: SCDs Code Status: Full code Family Communication:  Family updated today by neurology Disposition Plan: Plan to transfer to CIR, awaiting bed  Consultants:   Neurosurgery  Neurology   Procedures:  EEG;  with left hemispheric slowing, most prominent in the left temporal region, consistent with the patient history of left SDH.  Also noted rare left temporal sharp waves suggestive of epileptogenic  Potential.     Antimicrobials:   Amoxicillin   Subjective: Patient is more alert today, he is conversant, he is able to remember that he fell early during the month and was admitted with head bleed. Reports mild headache and back pain.  Objective: Vitals:   07/30/18 2338 07/31/18 0341 07/31/18 0733 07/31/18 1133  BP: (!) 126/54 122/61 (!) 143/68 108/75  Pulse: (!) 57 (!) 59 (!) 58 60  Resp: 19 19 16 20   Temp: 97.9 F (36.6 C) 98 F (36.7 C) 98.2 F (36.8 C) 97.9 F (36.6 C)  TempSrc: Oral Oral Oral Oral  SpO2: 100% 100% 98% 97%  Weight:  99.5 kg    Height:        Intake/Output Summary (Last 24 hours) at 07/31/2018 1221 Last data filed at 07/31/2018 2162 Gross per 24 hour  Intake 220 ml  Output 2500 ml  Net -2280 ml   Filed Weights   07/29/18 0500 07/30/18 0500 07/31/18 0341  Weight: 99 kg 99.2 kg 99.5 kg    Examination:  General exam: No acute distress Respiratory system: Clear to auscultation normal respiratory effort Cardiovascular system: S1-S2 regular rhythm and rate Gastrointestinal system: Sounds present, soft nontender nondistended Central nervous system: Alert following commands nonfocal Extremities: Symmetric power Skin: No rashes  Data Reviewed: I have personally reviewed following labs and imaging studies  CBC: Recent Labs  Lab 07/28/18 1839 07/28/18 1842 07/28/18 2052 07/29/18 0420 07/30/18 0416 07/31/18 0730  WBC 13.0*  --   --  7.2 6.2 5.4  NEUTROABS 10.6*  --   --  5.2  --   --   HGB 13.7 13.9 12.2* 13.3 13.2 14.0  HCT 42.5 41.0 36.0* 40.5 40.8 42.3  MCV 91.0  --   --  89.2 89.9 88.5    PLT 313  --   --  269 222 446   Basic Metabolic Panel: Recent Labs  Lab 07/28/18 1839 07/28/18 1842 07/28/18 2052 07/29/18 0420 07/30/18 0416 07/31/18 0730  NA 133* 131* 132* 135 137 135  K 4.6 4.5 4.6 4.4 4.2 4.1  CL 100 103  --  102 103 101  CO2 13*  --   --  18* 18* 17*  GLUCOSE 184* 181*  --  70 91 131*  BUN 21 20  --  20 17 16   CREATININE 1.25* 0.70  --  1.03 0.89 1.03  CALCIUM 9.1  --   --  8.9 8.7* 8.9   GFR: Estimated Creatinine Clearance: 83.1 mL/min (by C-G formula based on SCr of 1.03 mg/dL). Liver Function Tests: Recent Labs  Lab 07/28/18 1839  AST 16  ALT 16  ALKPHOS 103  BILITOT 1.8*  PROT 6.9  ALBUMIN 3.5   No results for input(s): LIPASE, AMYLASE in the last 168 hours. No results for input(s): AMMONIA in the last 168 hours. Coagulation Profile: Recent Labs  Lab 07/28/18 1839  INR 1.2   Cardiac Enzymes: No results for input(s): CKTOTAL, CKMB, CKMBINDEX, TROPONINI in the last 168 hours. BNP (last 3 results) No results for input(s): PROBNP in the last 8760 hours. HbA1C: No  results for input(s): HGBA1C in the last 72 hours. CBG: Recent Labs  Lab 07/30/18 1929 07/30/18 2336 07/31/18 0341 07/31/18 0735 07/31/18 1157  GLUCAP 104* 87 110* 115* 162*   Lipid Profile: No results for input(s): CHOL, HDL, LDLCALC, TRIG, CHOLHDL, LDLDIRECT in the last 72 hours. Thyroid Function Tests: No results for input(s): TSH, T4TOTAL, FREET4, T3FREE, THYROIDAB in the last 72 hours. Anemia Panel: No results for input(s): VITAMINB12, FOLATE, FERRITIN, TIBC, IRON, RETICCTPCT in the last 72 hours. Sepsis Labs: No results for input(s): PROCALCITON, LATICACIDVEN in the last 168 hours.  Recent Results (from the past 240 hour(s))  SARS Coronavirus 2 (CEPHEID - Performed in [place] hospital lab), Hosp Order     Status: None   Collection Time: 07/28/18  8:23 PM  Result Value Ref Range Status   SARS Coronavirus 2 NEGATIVE NEGATIVE Final    Comment: (NOTE) If  result is NEGATIVE SARS-CoV-2 target nucleic acids are NOT DETECTED. The SARS-CoV-2 RNA is generally detectable in upper and lower  respiratory specimens during the acute phase of infection. The lowest  concentration of SARS-CoV-2 viral copies this assay can detect is 250  copies / mL. A negative result does not preclude SARS-CoV-2 infection  and should not be used as the sole basis for treatment or other  patient management decisions.  A negative result may occur with  improper specimen collection / handling, submission of specimen other  than nasopharyngeal swab, presence of viral mutation(s) within the  areas targeted by this assay, and inadequate number of viral copies  (<250 copies / mL). A negative result must be combined with clinical  observations, patient history, and epidemiological information. If result is POSITIVE SARS-CoV-2 target nucleic acids are DETECTED. The SARS-CoV-2 RNA is generally detectable in upper and lower  respiratory specimens dur ing the acute phase of infection.  Positive  results are indicative of active infection with SARS-CoV-2.  Clinical  correlation with patient history and other diagnostic information is  necessary to determine patient infection status.  Positive results do  not rule out bacterial infection or co-infection with other viruses. If result is PRESUMPTIVE POSTIVE SARS-CoV-2 nucleic acids MAY BE PRESENT.   A presumptive positive result was obtained on the submitted specimen  and confirmed on repeat testing.  While 2019 novel coronavirus  (SARS-CoV-2) nucleic acids may be present in the submitted sample  additional confirmatory testing may be necessary for epidemiological  and / or clinical management purposes  to differentiate between  SARS-CoV-2 and other Sarbecovirus currently known to infect humans.  If clinically indicated additional testing with an alternate test  methodology [phone]) is advised. The SARS-CoV-2 RNA is generally    detectable in upper and lower respiratory sp ecimens during the acute  phase of infection. The expected result is Negative. Fact Sheet for Patients:  StrictlyIdeas.no Fact Sheet for Healthcare Providers: BankingDealers.co.za This test is not yet approved or cleared by the [place] FDA and has been authorized for detection and/or diagnosis of SARS-CoV-2 by FDA under an Emergency Use Authorization (EUA).  This EUA will remain in effect (meaning this test can be used) for the duration of the COVID-19 declaration under Section 564(b)(1) of the Act, 21 U.S.C. section 360bbb-3(b)(1), unless the authorization is terminated or revoked sooner. Performed at [facility], [place] [address], [place], [place] [address]          Radiology Studies: Mr Brain Wo Contrast  Result Date: 07/29/2018 CLINICAL DATA:  Continued surveillance subdural. Paroxysmally episodes  of aphasia. EXAM: MRI HEAD WITHOUT CONTRAST TECHNIQUE: Multiplanar, multiecho pulse sequences of the brain and surrounding structures were obtained without intravenous contrast. COMPARISON:  CT head yesterday.  CTA head neck yesterday. FINDINGS: Brain: No acute infarction, parenchymal hemorrhage, or intra-axial mass lesion. 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 Extra-axial hematoma, LEFT subdural, heterogeneous signal intensity, with moderate mass effect on the LEFT hemisphere, measures 10 mm thickness on coronal series 11, image 11. Vascular: Flow voids in the carotid, basilar, and RIGHT vertebral arteries are preserved. Absent flow void/slow or reversed flow LEFT vertebral; please see CTA report for additional detail regarding flow reversal, and extracranial disease. Skull and upper cervical spine: No fracture. Sinuses/Orbits: Unremarkable. Other: None. 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  No evidence for posterior circulation or hemispheric infarct, despite significant disease in both vertebrals on CTA. 10 mm thick LEFT subdural hematoma, with mass effect on the LEFT hemisphere. No midline shift due to pre-existing atrophy, but flattening of the cortical sulci is observed. Electronically Signed   By: Staci Righter M.D.   On: 07/29/2018 13:42   US Scrotum  Result Date: 07/29/2018 CLINICAL DATA:  Scrotal edema x1 day. EXAM: ULTRASOUND OF SCROTUM TECHNIQUE: Complete ultrasound examination of the testicles, epididymis, and other scrotal structures was performed. COMPARISON:  None. FINDINGS: Right testicle Measurements: 4.2 x 2.2 x 3.3 cm. No mass or microlithiasis visualized. Left testicle Measurements: 4.7 x 2.8 x 2.9 cm. No mass or microlithiasis visualized. Right epididymis:  Normal in size and appearance. Left epididymis:  Normal in size and appearance. Hydrocele: There is a very large left-sided hydrocele. There is a small to moderate sized right-sided hydrocele. Varicocele:  None visualized. IMPRESSION: 1. Large left-sided hydrocele. Small to moderate size right-sided hydrocele. 2. Otherwise, no additional significant sonographic abnormality detected. Electronically Signed   By: Constance Holster M.D.   On: 07/29/2018 17:02        Scheduled Meds:  amoxicillin-clavulanate  1 tablet Oral Q12H   atorvastatin  80 mg Oral q1800   diltiazem  360 mg Oral Daily   insulin aspart  0-9 Units Subcutaneous Q4H   levETIRAcetam  500 mg Oral BID   metoprolol succinate  100 mg Oral Daily   sodium chloride flush  3 mL Intravenous Once   sodium chloride flush  3 mL Intravenous Q12H   Continuous Infusions:   LOS: 2 days    Time spent: 35  minutes    Elmarie Shiley, MD [facility] Pager [phone]  If 7PM-7AM, please contact night-coverage [email or web address] Password [facility] 07/31/2018, 12:21 PM

## 2018-07-31 NOTE — PMR Pre-admission (Signed)
PMR Admission Coordinator Pre-Admission Assessment  Patient: [name] Frank is an 68 y.o., male MRN: [id number] DOB: 10/10/1950 Height: 5' 9"  (175.3 cm) Weight: 97.4 kg  Insurance Information HMO: yes    PPO:      PCP:      IPA:      80/20:      OTHER:  PRIMARY: [employer]      Policy#: [id number]      Subscriber: patient CM Name: n/a      Phone#:      Fax#: [phone] Pre-Cert#: [id number] case auto-approved on [email or web address] for admission on 08/01/2018 with updates due on Thursday 08/07/2018 to concurrent review case manager at [phone]      Employer:  Benefits:  Phone #: [phone]     Name:  Eff. Date: 03/12/2018     Deduct: $0      Out of Pocket Max: $3400 ($0)      Life Max: n/a CIR: $295/day for days 1-6      SNF: 20 full days Outpatient: $10-$40/visit     Co-Pay:  Home Health: 100%      Co-Pay:  DME: 80%     Co-Pay: 20% Providers: in network   SECONDARY:       Policy#:       Subscriber:  CM Name:       Phone#:      Fax#:  Pre-Cert#:       Employer:  Benefits:  Phone #:      Name:  Eff. Date:      Deduct:       Out of Pocket Max:       Life Max:  CIR:       SNF:  Outpatient:      Co-Pay:  Home Health:       Co-Pay:  DME:      Co-Pay:   Medicaid Application Date:       Case Manager:  Disability Application Date:       Case Worker:   The "Data Collection Information Summary" for patients in Inpatient Rehabilitation Facilities with attached "Privacy Act [place] Records" was provided and verbally reviewed with: Family  Emergency Contact Information Contact Information    Name Relation Home Work Mobile   Coykendall,LYNDA    [phone]      Current Medical History  Patient Admitting Diagnosis: AMS with recent TBI  History of Present Illness: Pt is a 68 y/o male with PMH of HTN, diabetes, CAD, and afrib recently admitted to [facility] following a fall with traumatic subarachnoid hemorrhage and subdural hematoma (5/4-5/12) recommendations for home with home health  services.  Pt readmitted on 5/18 with worsening lethargy, aphasia, and worsening AMS.  Imaging revealed stable SDH with evolution of hydrocephalus.  EEG showing focal epileptiform activity, thought to be 2/2 hematoma breakdown products.  Conservative management per neurosurgery.  Therapy evaluations completed and revealed pt with mobility, as well as cognitive and language deficits.  Recommendations were made for CIR.   Complete NIHSS TOTAL: 6  Patient's medical record from [facility] has been reviewed by the rehabilitation admission coordinator and physician.  Past Medical History  Past Medical History:  Diagnosis Date  . Diabetes mellitus   . Heart disease   . Hyperlipemia   . Hypertension     Family History   family history includes Appendicitis in his brother; Colitis in his sister; Early death in his mother; Hyperlipidemia in his father; Hypertension in his father; Stroke in his  father.  Prior Rehab/Hospitalizations Has the patient had prior rehab or hospitalizations prior to admission? Yes  Has the patient had major surgery during 100 days prior to admission? No   Current Medications  Current Facility-Administered Medications:  .  0.9 %  sodium chloride infusion, , Intravenous, Continuous, Regalado, Belkys A, MD, Last Rate: 75 mL/hr at 08/01/18 0646 .  acetaminophen (TYLENOL) tablet 650 mg, 650 mg, Oral, Q6H PRN, 650 mg at 07/31/18 0749 **OR** acetaminophen (TYLENOL) suppository 650 mg, 650 mg, Rectal, Q6H PRN, Opyd, Ilene Qua, MD .  amoxicillin-clavulanate (AUGMENTIN) 875-125 MG per tablet 1 tablet, 1 tablet, Oral, Q12H, Mariel Aloe, MD, 1 tablet at 08/01/18 [phone] .  atorvastatin (LIPITOR) tablet 80 mg, 80 mg, Oral, q1800, Opyd, Ilene Qua, MD, 80 mg at 07/31/18 1659 .  diltiazem (CARDIZEM CD) 24 hr capsule 360 mg, 360 mg, Oral, Daily, Regalado, Belkys A, MD, 360 mg at 07/31/18 1014 .  fentaNYL (SUBLIMAZE) injection 12.5-25 mcg, 12.5-25 mcg, Intravenous, Q2H PRN,  Opyd, Timothy S, MD .  hydrALAZINE (APRESOLINE) injection 5 mg, 5 mg, Intravenous, Q4H PRN, Opyd, Timothy S, MD .  insulin aspart (novoLOG) injection 0-9 Units, 0-9 Units, Subcutaneous, Q4H, Opyd, Ilene Qua, MD, 1 Units at 08/01/18 [phone] .  levETIRAcetam (KEPPRA) tablet 500 mg, 500 mg, Oral, BID, Regalado, Belkys A, MD, 500 mg at 08/01/18 0833 .  metoprolol succinate (TOPROL-XL) 24 hr tablet 100 mg, 100 mg, Oral, Daily, Opyd, Ilene Qua, MD, 100 mg at 07/31/18 1014 .  ondansetron (ZOFRAN) tablet 4 mg, 4 mg, Oral, Q6H PRN **OR** ondansetron (ZOFRAN) injection 4 mg, 4 mg, Intravenous, Q6H PRN, Opyd, Timothy S, MD .  polyethylene glycol (MIRALAX / GLYCOLAX) packet 17 g, 17 g, Oral, Daily PRN, Opyd, Timothy S, MD .  sodium chloride flush (NS) 0.9 % injection 3 mL, 3 mL, Intravenous, Once, Opyd, Timothy S, MD .  sodium chloride flush (NS) 0.9 % injection 3 mL, 3 mL, Intravenous, Q12H, Opyd, Timothy S, MD, 3 mL at 07/31/18 2057  Patients Current Diet:  Diet Order            Diet - low sodium heart healthy        Diet Carb Modified Fluid consistency: Thin; Room service appropriate? No  Diet effective now              Precautions / Restrictions Precautions Precautions: Fall Restrictions Weight Bearing Restrictions: No   Has the patient had 2 or more falls or a fall with injury in the past year? Yes  Prior Activity Level Community (5-7x/wk): active, working prior to his initial fall in April, driving  Prior Functional Level Self Care: Did the patient need help bathing, dressing, using the toilet or eating? Independent  Indoor Mobility: Did the patient need assistance with walking from room to room (with or without device)? Independent  Stairs: Did the patient need assistance with internal or external stairs (with or without device)? Independent  Functional Cognition: Did the patient need help planning regular tasks such as shopping or remembering to take medications? Independent, note:  pt helped his son do his taxes between his initial d/c in April and readmit in May  Home Assistive Devices / [place] Devices/Equipment: Wheelchair, Environmental consultant (specify type) Home Equipment: Environmental consultant - 2 wheels, Walker - 4 wheels, Bedside commode  Prior Device Use: Indicate devices/aids used by the patient prior to current illness, exacerbation or injury? None of the above  Current Functional Level Cognition  Overall Cognitive Status: Impaired/Different  from baseline Current Attention Level: Sustained Orientation Level: Oriented X4 Following Commands: Follows one step commands with increased time Safety/Judgement: Decreased awareness of deficits, Decreased awareness of safety General Comments: Pt frequently self distracts, and requires cues to redirect to task. Required multimodal cues for sequencing throughout mobility.  Attention: Sustained Focused Attention: Appears intact Sustained Attention: Impaired Sustained Attention Impairment: Functional basic, Verbal basic(pt easily distracted by discomfort from leg pain, decreased sustained attention to less than 1 minute during session) Memory: Impaired Memory Impairment: Storage deficit, Retrieval deficit, Decreased short term memory, Decreased recall of new information(pt did not recall information neurologist had shared with him within 3 minutes after leaving despite SLP verbal cues) Decreased Short Term Memory: Verbal basic(see above) Awareness: Impaired Awareness Impairment: Intellectual impairment Problem Solving: Impaired Problem Solving Impairment: Functional basic(to locate call bell and contact RN for pain medicine) Executive Function: (pt is not at this level at this time) Behaviors: Restless, Perseveration, Impulsive Safety/Judgment: Impaired    Extremity Assessment (includes Sensation/Coordination)  Upper Extremity Assessment: RUE deficits/detail RUE Deficits / Details: decreased ROM and poor coordination RUE  Coordination: decreased fine motor, decreased gross motor  Lower Extremity Assessment: Defer to PT evaluation    ADLs  Overall ADL's : Needs assistance/impaired Grooming: Minimal assistance, Sitting Upper Body Bathing: Sitting, Moderate assistance Lower Body Bathing: Sit to/from stand, Total assistance, +2 for safety/equipment Upper Body Dressing : Moderate assistance, Sitting Lower Body Dressing: Total assistance, +2 for safety/equipment, Sit to/from stand Toilet Transfer: Minimal assistance, Moderate assistance, RW, Ambulation, +2 for physical assistance, +2 for safety/equipment Toileting- Clothing Manipulation and Hygiene: Maximal assistance, Sit to/from stand Functional mobility during ADLs: Moderate assistance, Rolling walker General ADL Comments: pt limited by cognition, pain and balance    Mobility  Overal bed mobility: Needs Assistance Bed Mobility: Sidelying to Sit, Sit to Supine Rolling: Mod assist Sidelying to sit: Mod assist Supine to sit: Min assist Sit to supine: Mod assist General bed mobility comments: Mod A for bed mobility tasks. Continues to require multimodal cues for sequencing when performing mobility tasks. Pt reporting dizziness upon sitting and BP at 118/73 mmHg.     Transfers  Overall transfer level: Needs assistance Equipment used: Rolling walker (2 wheeled) Transfers: Sit to/from Stand Sit to Stand: Mod assist Stand pivot transfers: Min assist  Lateral/Scoot Transfers: Mod assist General transfer comment: Mod A for lift assist and steadying to stand. Upon standing, pt reporting increased dizziness. Checked BP, and BP at 108/75 mmHg. Pt requesting to sit back down and return to bed. Was able to stand for ~2-3 mins for BP to take. Required mod A to assist with lateral scoots X3-4 to scoot up towards [place]. Required mod A for assist with scooting and multimodal cues for sequencing. '    Ambulation / Gait / Stairs / Wheelchair Mobility   Ambulation/Gait Ambulation/Gait assistance: Herbalist (Feet): 10 Feet(x2 (to/from bathroom)) Assistive device: Rolling walker (2 wheeled) Gait Pattern/deviations: Step-through pattern, Decreased dorsiflexion - right, Decreased dorsiflexion - left, Trunk flexed General Gait Details: pt with extremely short shuffled steps, max directional v/c's, modA for walker management Gait velocity: decreased Gait velocity interpretation: <1.8 ft/sec, indicate of risk for recurrent falls Stairs assistance: Min assist Stair Management: One rail Right General stair comments: cues for sequencing (up with left foot, down with right foot). step by step sequencing    Posture / Balance Dynamic Sitting Balance Sitting balance - Comments: min guard for safety statically Balance Overall balance assessment: Needs assistance, History of Falls  Sitting-balance support: Feet supported Sitting balance-Leahy Scale: Fair Sitting balance - Comments: min guard for safety statically Standing balance support: Bilateral upper extremity supported, During functional activity Standing balance-Leahy Scale: Poor Standing balance comment: reliant on B UE and external support    Special needs/care consideration BiPAP/CPAP no CPM no Continuous Drip IV no Dialysis no        Days n/a Life Vest no Oxygen no Special Bed no Trach Size no Wound Vac (area) no      Location n/a Skin abrasion to bil UEs/LEs, ecchymosis to R hip Bowel mgmt: incontinent, last BM 07/28/2018 Bladder mgmt: incontinent Diabetic mgmt: yes Behavioral consideration TBI Chemo/radiation no   Previous Home Environment (from acute therapy documentation) Living Arrangements: Spouse/significant other  Lives With: Spouse Available Help at Discharge: Family, Available 24 hours/day Type of Home: House Home Layout: Multi-level, Able to live on main level with bedroom/bathroom(hospital bed on 1st floor) Home Access: Stairs to enter Entrance  Stairs-Rails: (bil) Entrance Stairs-Number of Steps: 5 Bathroom Shower/Tub: Multimedia programmer: Handicapped height Home Care Services: No  Discharge Living Setting Plans for Discharge Living Setting: Patient's home Type of Home at Discharge: House Discharge Home Layout: Two level, Able to live on main level with bedroom/bathroom Discharge Home Access: Stairs to enter Entrance Stairs-Rails: Left, Right Entrance Stairs-Number of Steps: 5 Discharge Bathroom Shower/Tub: Walk-in shower Discharge Bathroom Toilet: Handicapped height Discharge Bathroom Accessibility: Yes How Accessible: Accessible via walker Does the patient have any problems obtaining your medications?: No  Social/Family/Support Systems Patient Roles: Spouse, Parent Anticipated Caregiver: pt's wife, [name] Anticipated Caregiver's Contact Information: [phone] Ability/Limitations of Caregiver: min assist, she has been providing this since his discharge in April Caregiver Availability: 24/7 Discharge Plan Discussed with Primary Caregiver: Yes Is Caregiver In Agreement with Plan?: Yes Does Caregiver/Family have Issues with Lodging/Transportation while Pt is in Rehab?: No  Goals/Additional Needs Patient/Family Goal for Rehab: PT/OT/SLP supervision Expected length of stay: 10-14 days Dietary Needs: carb modified Equipment Needs: tbd Pt/Family Agrees to Admission and willing to participate: Yes Program Orientation Provided & Reviewed with Pt/Caregiver Including Roles  & Responsibilities: Yes  Possible need for SNF placement upon discharge: no  Patient Condition: I have reviewed medical records from [facility], spoken with CM, and patient and spouse. I met with patient at the bedside and discussed with wife via phone for inpatient rehabilitation assessment.  Patient will benefit from ongoing PT, OT and SLP, can actively participate in 3 hours of therapy a day 5 days of the week, and can make  measurable gains during the admission.  Patient will also benefit from the coordinated team approach during an Inpatient Acute Rehabilitation admission.  The patient will receive intensive therapy as well as Rehabilitation physician, nursing, social worker, and care management interventions.  Due to bladder management, bowel management, safety, skin/wound care, disease management, medication administration, pain management and patient education the patient requires 24 hour a day rehabilitation nursing.  The patient is currently mod assist with mobility and basic ADLs.  Discharge setting and therapy post discharge at home with home health is anticipated.  Patient has agreed to participate in the Acute Inpatient Rehabilitation Program and will admit today.  Preadmission Screen Completed By:  Michel Santee, PT, DPT 08/01/2018 11:22 AM ______________________________________________________________________   Discussed status with Dr. Naaman Plummer on 08/01/18  at 11:22 AM  and received approval for admission today.  Admission Coordinator:  Michel Santee, PT, DPT 11:22 AM Sudie Grumbling 08/01/18    Assessment/Plan: Diagnosis:  TBI 1. Does the need for close, 24 hr/day Medical supervision in concert with the patient's rehab needs make it unreasonable for this patient to be served in a less intensive setting? Yes 2. Co-Morbidities requiring supervision/potential complications: HTN, DM, afib 3. Due to bladder management, bowel management, safety, skin/wound care, disease management, medication administration, pain management and patient education, does the patient require 24 hr/day rehab nursing? Yes 4. Does the patient require coordinated care of a physician, rehab nurse, PT (1-2 hrs/day, 5 days/week), OT (1-2 hrs/day, 5 days/week) and SLP (1-2 hrs/day, 5 days/week) to address physical and functional deficits in the context of the above medical diagnosis(es)? Yes Addressing deficits in the following areas: balance,  endurance, locomotion, strength, transferring, bowel/bladder control, bathing, dressing, feeding, grooming, toileting, cognition and psychosocial support 5. Can the patient actively participate in an intensive therapy program of at least 3 hrs of therapy 5 days a week? Yes 6. The potential for patient to make measurable gains while on inpatient rehab is excellent 7. Anticipated functional outcomes upon discharge from inpatients are: supervision PT, supervision OT, supervision SLP 8. Estimated rehab length of stay to reach the above functional goals is: 10-14 d 9. Anticipated D/C setting: Home 10. Anticipated post D/C treatments: [place] therapy 11. Overall Rehab/Functional Prognosis: excellent  MD Signature: Meredith Staggers, MD, [facility] 08/01/2018

## 2018-07-31 NOTE — Progress Notes (Signed)
Occupational Therapy Treatment Patient Details Name: [name] HeritageDonald G Rando MRN: [id number] DOB: [birth year] Today's Date: 07/31/2018    History of present illness Pt is a 68 year-old male who presented with AMS.  Found to have hydrocephalus and concern for seizures. Noted recent admission with laceration on back of his scalp. 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 Continued workup. Pt is s/p EEG which showed left hemispheric slowing with rare left temporal sharp waves.    OT comments  Pt performing ADL functional mobility for sit to stand with modA with RW able to take a few [facility]uffled [facility] and required to sit down due to dizziness. Pt's BP taken after exertion: 124/60. Pt performed light grooming in sitting with set-upA. Mod A for bed mobility back into bed. Pt not feeling up to recliner this PM. Pt's L side remains weak. Pt would greatly benefit from continued OT skilled services for ADL, mobility and safety in CIR setting. Pt remains confused and follows only simple commands. OT following.    Follow Up Recommendations  CIR;Supervision/Assistance - 24 hour    Equipment Recommendations  Other (comment)(to be determined)    Recommendations for Other Services      Precautions / Restrictions Precautions Precautions: Fall Restrictions Weight Bearing Restrictions: No       Mobility Bed Mobility Overal bed mobility: Needs Assistance Bed Mobility: Sidelying to Sit;Sit to Supine Rolling: Mod assist Sidelying to sit: Mod assist   Sit to supine: Mod assist   General bed mobility comments: Mod A for bed mobility tasks. Continues to require multimodal cues for sequencing when performing mobility tasks. Pt reporting dizziness upon sitting and BP at 118/73 mmHg.   Transfers Overall transfer level: Needs assistance Equipment used: Rolling walker (2 wheeled) Transfers: Sit to/from Stand Sit to Stand: Mod assist         Lateral/Scoot Transfers: Mod assist General transfer comment: Mod A for lift assist and steadying to stand. Upon standing, pt reporting increased dizziness. Checked BP, and BP at 108/75 mmHg. Pt requesting to sit back down and return to bed. Was able to stand for ~2-3 mins for BP to take. Required mod A to assist with lateral scoots X3-4 to scoot up towards HOB. Required mod A for assist with scooting and multimodal cues for sequencing. '    Balance Overall balance assessment: Needs assistance;History of Falls Sitting-balance support: Feet supported Sitting balance-Leahy Scale: Fair     Standing balance support: Bilateral upper extremity supported;During functional activity Standing balance-Leahy Scale: Poor Standing balance comment: reliant on B UE and external support                           ADL either performed or assessed with clinical judgement   ADL Overall ADL's : Needs assistance/impaired                                     Functional mobility during ADLs: Moderate assistance;Rolling walker General ADL Comments: pt limited by cognition, pain and balance     Vision   Vision Assessment?: Yes Eye Alignment: Impaired (comment) Additional Comments: continue to assess   Perception     Praxis      Cognition Arousal/Alertness: Awake/alert Behavior During Therapy: Flat affect Overall Cognitive Status: Impaired/Different from baseline Area of Impairment: Attention;Memory;Following commands;Safety/judgement;Awareness;Problem solving  Current Attention Level: Sustained Memory: Decreased recall of precautions;Decreased short-term memory Following Commands: Follows one step commands with increased time Safety/Judgement: Decreased awareness of deficits;Decreased awareness of safety Awareness: Intellectual Problem Solving: Slow processing;Difficulty sequencing;Decreased initiation;Requires tactile cues;Requires verbal  cues General Comments: Pt frequently self distracts, and requires cues to redirect to task. Required multimodal cues for sequencing throughout mobility.         Exercises     Shoulder Instructions       General Comments      Pertinent Vitals/ Pain       Pain Assessment: No/denies pain  Home Living                                          Prior Functioning/Environment              Frequency  Min 3X/week        Progress Toward Goals  OT Goals(current goals can now be found in the care plan section)  Progress towards OT goals: Progressing toward goals  Acute Rehab OT Goals Patient Stated Goal: "to talk to my wife"  OT Goal Formulation: With patient Time For Goal Achievement: 08/14/18 Potential to Achieve Goals: Good ADL Goals Pt Will Perform Grooming: with supervision;standing Pt Will Perform Upper Body Bathing: with supervision;sitting Pt Will Perform Lower Body Bathing: with supervision;with adaptive equipment;sit to/from stand Pt Will Perform Upper Body Dressing: with supervision;with set-up;sitting Pt Will Perform Lower Body Dressing: with supervision;sit to/from stand;with adaptive equipment Pt Will Transfer to Toilet: with supervision;ambulating;bedside commode Pt Will Perform Toileting - Clothing Manipulation and hygiene: sit to/from stand;with supervision Additional ADL Goal #1: Pt will follow 1-2 step commands with 90% accuracy within appropriate time in order to optimize participation in ADLs. Additional ADL Goal #2: Pt will alternate and divide attention with min cues during novel tasks  Plan Discharge plan needs to be updated    Co-evaluation                 AM-PAC OT "6 Clicks" Daily Activity     Outcome Measure   Help from another person eating meals?: A Little Help from another person taking care of personal grooming?: A Little Help from another person toileting, which includes using toliet, bedpan, or urinal?: A  Lot Help from another person bathing (including washing, rinsing, drying)?: A Lot Help from another person to put on and taking off regular upper body clothing?: A Lot Help from another person to put on and taking off regular lower body clothing?: Total 6 Click Score: 13    End of Session Equipment Utilized During Treatment: Gait belt;Rolling walker  OT Visit Diagnosis: Unsteadiness on feet (R26.81);Cognitive communication deficit (R41.841);Muscle weakness (generalized) (M62.81);Pain;Other symptoms and signs involving cognitive function Symptoms and signs involving cognitive functions: Other cerebrovascular disease   Activity Tolerance Patient tolerated treatment well   Patient Left in bed;with call bell/phone within reach;with bed alarm set   Nurse Communication Mobility status        Time: 1450-1515 OT Time Calculation (min): 25 min  Charges: OT General Charges $OT Visit: 1 Visit OT Treatments $Self Care/Home Management : 8-22 mins $Neuromuscular Re-education: 8-22 mins  Cristi Loron) Glendell Docker OTR/L Acute Rehabilitation Services Pager: [phone] Office: [phone]    Lonzo Cloud 07/31/2018, 4:43 PM

## 2018-07-31 NOTE — Progress Notes (Signed)
Spoke with patients nurse regarding consult. Nurse indicated that she would get with patient and his wife and arrange for them to call their priest to get blessings over phone due Covid 19 restrictions.  Chaplain available as needed.  Venida Jarvis, [place], [facility] [place], Pager  [phone]

## 2018-07-31 NOTE — Progress Notes (Signed)
Physical Therapy Treatment Patient Details Name: [name] Frank MRN: [id number] DOB: July 01, 1950 Today's Date: 07/31/2018    History of Present Illness Pt is a 68 year-old male who presented with AMS.  Found to have hydrocephalus and concern for seizures. Noted recent admission with laceration on back of his scalp. 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 Continued workup. Pt is s/p EEG which showed left hemispheric slowing with rare left temporal sharp waves.     PT Comments    Pt progressing towards goals, however, continues to present with dizziness with mobility tasks.  Practiced standing, however, pt reporting increased dizziness in standing and wanting to return to bed. BP in sitting at 118/73 mmHg, and BP in standing was 108/75 mmHg. Also performed lateral scooting up towards HOB with mod A. Feel current recommendations appropriate. Will continue to follow acutely to maximize functional mobility independence and safety.   Follow Up Recommendations  CIR     Equipment Recommendations  Rolling walker with 5" wheels;3in1 (PT)    Recommendations for Other Services       Precautions / Restrictions Precautions Precautions: Fall Restrictions Weight Bearing Restrictions: No    Mobility  Bed Mobility Overal bed mobility: Needs Assistance Bed Mobility: Rolling;Sidelying to Sit;Sit to Supine Rolling: Mod assist Sidelying to sit: Mod assist   Sit to supine: Mod assist   General bed mobility comments: Mod A for bed mobility tasks. Continues to require multimodal cues for sequencing when performing mobility tasks. Pt reporting dizziness upon sitting and BP at 118/73 mmHg.   Transfers Overall transfer level: Needs assistance Equipment used: Rolling walker (2 wheeled) Transfers: Sit to/from Stand;Lateral/Scoot Transfers Sit to Stand: Mod assist        Lateral/Scoot Transfers: Mod assist General transfer comment: Mod A for lift  assist and steadying to stand. Upon standing, pt reporting increased dizziness. Checked BP, and BP at 108/75 mmHg. Pt requesting to sit back down and return to bed. Was able to stand for ~2-3 mins for BP to take. Required mod A to assist with lateral scoots X3-4 to scoot up towards HOB. Required mod A for assist with scooting and multimodal cues for sequencing. '  Ambulation/Gait                 Stairs             Wheelchair Mobility    Modified Rankin (Stroke Patients Only) Modified Rankin (Stroke Patients Only) Pre-Morbid Rankin Score: No symptoms Modified Rankin: Moderately severe disability     Balance Overall balance assessment: Needs assistance;History of Falls Sitting-balance support: Feet supported Sitting balance-Leahy Scale: Fair     Standing balance support: Bilateral upper extremity supported;During functional activity Standing balance-Leahy Scale: Poor Standing balance comment: reliant on B UE and external support                            Cognition Arousal/Alertness: Awake/alert Behavior During Therapy: Flat affect Overall Cognitive Status: Impaired/Different from baseline Area of Impairment: Attention;Memory;Following commands;Safety/judgement;Awareness;Problem solving                   Current Attention Level: Sustained Memory: Decreased recall of precautions;Decreased short-term memory Following Commands: Follows one step commands with increased time Safety/Judgement: Decreased awareness of deficits;Decreased awareness of safety Awareness: Intellectual Problem Solving: Slow processing;Difficulty sequencing;Decreased initiation;Requires tactile cues;Requires verbal cues        Exercises  General Comments        Pertinent Vitals/Pain Pain Assessment: No/denies pain    Home Living                      Prior Function            PT Goals (current goals can now be found in the care plan section) Acute  Rehab PT Goals Patient Stated Goal: "to talk to my wife"  PT Goal Formulation: With patient Time For Goal Achievement: 08/12/18 Potential to Achieve Goals: Good Progress towards PT goals: Progressing toward goals    Frequency    Min 4X/week      PT Plan Current plan remains appropriate    Co-evaluation              AM-PAC PT "6 Clicks" Mobility   Outcome Measure  Help needed turning from your back to your side while in a flat bed without using bedrails?: A Lot Help needed moving from lying on your back to sitting on the side of a flat bed without using bedrails?: A Lot Help needed moving to and from a bed to a chair (including a wheelchair)?: A Little Help needed standing up from a chair using your arms (e.g., wheelchair or bedside chair)?: A Lot Help needed to walk in hospital room?: A Lot Help needed climbing 3-5 steps with a railing? : A Lot 6 Click Score: 13    End of Session Equipment Utilized During Treatment: Gait belt Activity Tolerance: Treatment limited secondary to medical complications (Comment)(dizziness) Patient left: in bed;with call bell/phone within reach;with bed alarm set Nurse Communication: Mobility status PT Visit Diagnosis: Unsteadiness on feet (R26.81);Difficulty in walking, not elsewhere classified (R26.2);Other symptoms and signs involving the nervous system (R29.898)     Time: [id number] PT Time Calculation (min) (ACUTE ONLY): 25 min  Charges:  $Therapeutic Activity: 23-37 mins                     Gladys DammeBrittany Carolyn Sylvia, PT, DPT  Acute Rehabilitation Services  Pager: [phone] Office: [phone]    Lehman PromBrittany S Navika Hoopes 07/31/2018, 12:58 PM

## 2018-08-01 ENCOUNTER — Inpatient Hospital Stay (HOSPITAL_COMMUNITY)
Admission: RE | Admit: 2018-08-01 | Discharge: 2018-08-22 | DRG: 091 | Disposition: A | Discharge status: Home Health | Payer: Medicare HMO | Source: Intra-hospital | Attending: Physical Medicine & Rehabilitation | Admitting: Physical Medicine & Rehabilitation

## 2018-08-01 DIAGNOSIS — Z8349 Family history of other endocrine, nutritional and metabolic diseases: Secondary | ICD-10-CM

## 2018-08-01 DIAGNOSIS — Z6831 Body mass index (BMI) 31.0-31.9, adult: Secondary | ICD-10-CM

## 2018-08-01 DIAGNOSIS — E46 Unspecified protein-calorie malnutrition: Secondary | ICD-10-CM

## 2018-08-01 DIAGNOSIS — R29810 Facial weakness: Secondary | ICD-10-CM | POA: Diagnosis present

## 2018-08-01 DIAGNOSIS — R001 Bradycardia, unspecified: Secondary | ICD-10-CM | POA: Diagnosis present

## 2018-08-01 DIAGNOSIS — I2510 Atherosclerotic heart disease of native coronary artery without angina pectoris: Secondary | ICD-10-CM

## 2018-08-01 DIAGNOSIS — R339 Retention of urine, unspecified: Secondary | ICD-10-CM | POA: Diagnosis present

## 2018-08-01 DIAGNOSIS — R2689 Other abnormalities of gait and mobility: Secondary | ICD-10-CM | POA: Diagnosis present

## 2018-08-01 DIAGNOSIS — S066X9D Traumatic subarachnoid hemorrhage with loss of consciousness of unspecified duration, subsequent encounter: Secondary | ICD-10-CM | POA: Diagnosis not present

## 2018-08-01 DIAGNOSIS — Z7982 Long term (current) use of aspirin: Secondary | ICD-10-CM | POA: Diagnosis not present

## 2018-08-01 DIAGNOSIS — R4781 Slurred speech: Secondary | ICD-10-CM | POA: Diagnosis present

## 2018-08-01 DIAGNOSIS — E119 Type 2 diabetes mellitus without complications: Secondary | ICD-10-CM | POA: Diagnosis present

## 2018-08-01 DIAGNOSIS — Z87820 Personal history of traumatic brain injury: Secondary | ICD-10-CM

## 2018-08-01 DIAGNOSIS — N433 Hydrocele, unspecified: Secondary | ICD-10-CM

## 2018-08-01 DIAGNOSIS — I480 Paroxysmal atrial fibrillation: Secondary | ICD-10-CM | POA: Diagnosis present

## 2018-08-01 DIAGNOSIS — E1169 Type 2 diabetes mellitus with other specified complication: Secondary | ICD-10-CM

## 2018-08-01 DIAGNOSIS — S065X9A Traumatic subdural hemorrhage with loss of consciousness of unspecified duration, initial encounter: Secondary | ICD-10-CM | POA: Diagnosis present

## 2018-08-01 DIAGNOSIS — Z823 Family history of stroke: Secondary | ICD-10-CM | POA: Diagnosis not present

## 2018-08-01 DIAGNOSIS — N179 Acute kidney failure, unspecified: Secondary | ICD-10-CM | POA: Diagnosis present

## 2018-08-01 DIAGNOSIS — G919 Hydrocephalus, unspecified: Secondary | ICD-10-CM | POA: Diagnosis present

## 2018-08-01 DIAGNOSIS — N5089 Other specified disorders of the male genital organs: Secondary | ICD-10-CM | POA: Diagnosis present

## 2018-08-01 DIAGNOSIS — E669 Obesity, unspecified: Secondary | ICD-10-CM | POA: Diagnosis not present

## 2018-08-01 DIAGNOSIS — D708 Other neutropenia: Secondary | ICD-10-CM | POA: Diagnosis not present

## 2018-08-01 DIAGNOSIS — I10 Essential (primary) hypertension: Secondary | ICD-10-CM | POA: Diagnosis present

## 2018-08-01 DIAGNOSIS — E785 Hyperlipidemia, unspecified: Secondary | ICD-10-CM | POA: Diagnosis present

## 2018-08-01 DIAGNOSIS — R569 Unspecified convulsions: Secondary | ICD-10-CM

## 2018-08-01 DIAGNOSIS — Z8249 Family history of ischemic heart disease and other diseases of the circulatory system: Secondary | ICD-10-CM

## 2018-08-01 DIAGNOSIS — D72819 Decreased white blood cell count, unspecified: Secondary | ICD-10-CM | POA: Diagnosis present

## 2018-08-01 DIAGNOSIS — Z7984 Long term (current) use of oral hypoglycemic drugs: Secondary | ICD-10-CM

## 2018-08-01 DIAGNOSIS — Z955 Presence of coronary angioplasty implant and graft: Secondary | ICD-10-CM

## 2018-08-01 DIAGNOSIS — E441 Mild protein-calorie malnutrition: Secondary | ICD-10-CM | POA: Diagnosis present

## 2018-08-01 DIAGNOSIS — R4701 Aphasia: Secondary | ICD-10-CM | POA: Diagnosis present

## 2018-08-01 DIAGNOSIS — R400 Somnolence: Secondary | ICD-10-CM

## 2018-08-01 DIAGNOSIS — Z79891 Long term (current) use of opiate analgesic: Secondary | ICD-10-CM

## 2018-08-01 DIAGNOSIS — S069X9A Unspecified intracranial injury with loss of consciousness of unspecified duration, initial encounter: Secondary | ICD-10-CM | POA: Diagnosis not present

## 2018-08-01 DIAGNOSIS — G40909 Epilepsy, unspecified, not intractable, without status epilepticus: Secondary | ICD-10-CM | POA: Diagnosis present

## 2018-08-01 DIAGNOSIS — G911 Obstructive hydrocephalus: Secondary | ICD-10-CM

## 2018-08-01 DIAGNOSIS — S065XAA Traumatic subdural hemorrhage with loss of consciousness status unknown, initial encounter: Secondary | ICD-10-CM | POA: Diagnosis present

## 2018-08-01 DIAGNOSIS — E8809 Other disorders of plasma-protein metabolism, not elsewhere classified: Secondary | ICD-10-CM

## 2018-08-01 DIAGNOSIS — Z79899 Other long term (current) drug therapy: Secondary | ICD-10-CM

## 2018-08-01 LAB — CBC
HCT: 39.9 % (ref 39.0–52.0)
Hemoglobin: 13.6 g/dL (ref 13.0–17.0)
MCH: 29.4 pg (ref 26.0–34.0)
MCHC: 34.1 g/dL (ref 30.0–36.0)
MCV: 86.2 fL (ref 80.0–100.0)
Platelets: 242 10*3/uL (ref 150–400)
RBC: 4.63 MIL/uL (ref 4.22–5.81)
RDW: 14.1 % (ref 11.5–15.5)
WBC: 4.9 10*3/uL (ref 4.0–10.5)
nRBC: 0 % (ref 0.0–0.2)

## 2018-08-01 LAB — BASIC METABOLIC PANEL
Anion gap: 16 — ABNORMAL HIGH (ref 5–15)
BUN: 16 mg/dL (ref 8–23)
CO2: 19 mmol/L — ABNORMAL LOW (ref 22–32)
Calcium: 8.9 mg/dL (ref 8.9–10.3)
Chloride: 103 mmol/L (ref 98–111)
Creatinine, Ser: 0.89 mg/dL (ref 0.61–1.24)
GFR calc Af Amer: >60 mL/min (ref >60)
GFR calc non Af Amer: >60 mL/min (ref >60)
Glucose, Bld: 124 mg/dL — ABNORMAL HIGH (ref 70–99)
Potassium: 3.9 mmol/L (ref 3.5–5.1)
Sodium: 138 mmol/L (ref 135–145)

## 2018-08-01 LAB — GLUCOSE, CAPILLARY
Glucose-Capillary: 167 mg/dL — ABNORMAL HIGH (ref 70–99)
Glucose-Capillary: 123 mg/dL — ABNORMAL HIGH (ref 70–99)
Glucose-Capillary: 128 mg/dL — ABNORMAL HIGH (ref 70–99)
Glucose-Capillary: 95 mg/dL (ref 70–99)
Glucose-Capillary: 140 mg/dL — ABNORMAL HIGH (ref 70–99)

## 2018-08-01 MED ORDER — ATORVASTATIN CALCIUM 80 MG PO TABS
80.0000 mg | ORAL_TABLET | Freq: Every day | ORAL | Status: DC
  Administered 2018-08-02 – 2018-08-21 (×20): 80 mg via ORAL
  Filled 2018-08-01 (×20): qty 1

## 2018-08-01 MED ORDER — PROCHLORPERAZINE 25 MG RE SUPP: 12.5000 mg | Freq: Four times a day (QID) | RECTAL | Status: DC | PRN

## 2018-08-01 MED ORDER — AMOXICILLIN-POT CLAVULANATE 875-125 MG PO TABS: 1.0000 | ORAL_TABLET | Freq: Two times a day (BID) | ORAL | 0 refills | Status: DC

## 2018-08-01 MED ORDER — TRAZODONE HCL 50 MG PO TABS
25.0000 mg | ORAL_TABLET | Freq: Every evening | ORAL | Status: DC | PRN
  Administered 2018-08-02 – 2018-08-03 (×2): 50 mg via ORAL
  Filled 2018-08-01 (×2): qty 1

## 2018-08-01 MED ORDER — INSULIN ASPART 100 UNIT/ML ~~LOC~~ SOLN
0.0000 [IU] | Freq: Three times a day (TID) | SUBCUTANEOUS | Status: DC
  Administered 2018-08-02 – 2018-08-04 (×8): 2 [IU] via SUBCUTANEOUS
  Administered 2018-08-05 (×2): 3 [IU] via SUBCUTANEOUS
  Administered 2018-08-05: 1 [IU] via SUBCUTANEOUS
  Administered 2018-08-06 (×2): 2 [IU] via SUBCUTANEOUS
  Administered 2018-08-06: 3 [IU] via SUBCUTANEOUS
  Administered 2018-08-07 (×2): 2 [IU] via SUBCUTANEOUS
  Administered 2018-08-07: 5 [IU] via SUBCUTANEOUS
  Administered 2018-08-08: 2 [IU] via SUBCUTANEOUS
  Administered 2018-08-08 (×2): 3 [IU] via SUBCUTANEOUS
  Administered 2018-08-09 (×2): 2 [IU] via SUBCUTANEOUS
  Administered 2018-08-09: 3 [IU] via SUBCUTANEOUS
  Administered 2018-08-10 (×2): 2 [IU] via SUBCUTANEOUS
  Administered 2018-08-10: 3 [IU] via SUBCUTANEOUS
  Administered 2018-08-11: 09:00:00 2 [IU] via SUBCUTANEOUS
  Administered 2018-08-11: 12:00:00 3 [IU] via SUBCUTANEOUS
  Administered 2018-08-11 – 2018-08-12 (×3): 2 [IU] via SUBCUTANEOUS
  Administered 2018-08-12 – 2018-08-13 (×2): 1 [IU] via SUBCUTANEOUS
  Administered 2018-08-13: 13:00:00 3 [IU] via SUBCUTANEOUS
  Administered 2018-08-14: 07:00:00 2 [IU] via SUBCUTANEOUS
  Administered 2018-08-14: 18:00:00 3 [IU] via SUBCUTANEOUS
  Administered 2018-08-14 – 2018-08-15 (×2): 2 [IU] via SUBCUTANEOUS
  Administered 2018-08-15 (×2): 3 [IU] via SUBCUTANEOUS
  Administered 2018-08-16 (×2): 2 [IU] via SUBCUTANEOUS
  Administered 2018-08-16 – 2018-08-17 (×3): 3 [IU] via SUBCUTANEOUS
  Administered 2018-08-17: 2 [IU] via SUBCUTANEOUS
  Administered 2018-08-18 (×2): 3 [IU] via SUBCUTANEOUS
  Administered 2018-08-18 – 2018-08-19 (×2): 2 [IU] via SUBCUTANEOUS
  Administered 2018-08-19: 3 [IU] via SUBCUTANEOUS
  Administered 2018-08-19 – 2018-08-20 (×2): 2 [IU] via SUBCUTANEOUS
  Administered 2018-08-20: 12:00:00 3 [IU] via SUBCUTANEOUS
  Administered 2018-08-20: 09:00:00 2 [IU] via SUBCUTANEOUS
  Administered 2018-08-21: 19:00:00 3 [IU] via SUBCUTANEOUS
  Administered 2018-08-21: 10:00:00 1 [IU] via SUBCUTANEOUS
  Administered 2018-08-21: 13:00:00 3 [IU] via SUBCUTANEOUS
  Administered 2018-08-22: 06:00:00 2 [IU] via SUBCUTANEOUS

## 2018-08-01 MED ORDER — POLYETHYLENE GLYCOL 3350 17 G PO PACK
17.0000 g | PACK | Freq: Every day | ORAL | Status: DC | PRN
  Administered 2018-08-03 – 2018-08-16 (×2): 17 g via ORAL
  Filled 2018-08-01 (×3): qty 1

## 2018-08-01 MED ORDER — DIPHENHYDRAMINE HCL 12.5 MG/5ML PO ELIX: 12.5000 mg | ORAL_SOLUTION | Freq: Four times a day (QID) | ORAL | Status: DC | PRN

## 2018-08-01 MED ORDER — LEVETIRACETAM 500 MG PO TABS
500.0000 mg | ORAL_TABLET | Freq: Two times a day (BID) | ORAL | Status: DC
  Administered 2018-08-02 – 2018-08-04 (×5): 500 mg via ORAL
  Filled 2018-08-01 (×6): qty 1

## 2018-08-01 MED ORDER — PROCHLORPERAZINE EDISYLATE 10 MG/2ML IJ SOLN: 5.0000 mg | Freq: Four times a day (QID) | INTRAMUSCULAR | Status: DC | PRN

## 2018-08-01 MED ORDER — INSULIN ASPART 100 UNIT/ML ~~LOC~~ SOLN
0.0000 [IU] | Freq: Every day | SUBCUTANEOUS | Status: DC
  Administered 2018-08-03 – 2018-08-10 (×5): 2 [IU] via SUBCUTANEOUS
  Administered 2018-08-12: 22:00:00 3 [IU] via SUBCUTANEOUS
  Administered 2018-08-13 – 2018-08-21 (×7): 2 [IU] via SUBCUTANEOUS

## 2018-08-01 MED ORDER — ONDANSETRON HCL 4 MG/2ML IJ SOLN: 4.0000 mg | Freq: Four times a day (QID) | INTRAMUSCULAR | Status: DC | PRN

## 2018-08-01 MED ORDER — PROCHLORPERAZINE MALEATE 5 MG PO TABS: 5.0000 mg | ORAL_TABLET | Freq: Four times a day (QID) | ORAL | Status: DC | PRN

## 2018-08-01 MED ORDER — LEVETIRACETAM 500 MG PO TABS: 500.0000 mg | ORAL_TABLET | Freq: Two times a day (BID) | ORAL | Status: DC

## 2018-08-01 MED ORDER — BISACODYL 10 MG RE SUPP
10.0000 mg | Freq: Every day | RECTAL | Status: DC | PRN
  Administered 2018-08-05 – 2018-08-17 (×2): 10 mg via RECTAL
  Filled 2018-08-01 (×3): qty 1

## 2018-08-01 MED ORDER — AMOXICILLIN-POT CLAVULANATE 875-125 MG PO TABS
1.0000 | ORAL_TABLET | Freq: Two times a day (BID) | ORAL | Status: DC
  Administered 2018-08-02 – 2018-08-21 (×39): 1 via ORAL
  Filled 2018-08-01 (×39): qty 1

## 2018-08-01 MED ORDER — ALUM & MAG HYDROXIDE-SIMETH 200-200-20 MG/5ML PO SUSP: 30.0000 mL | ORAL | Status: DC | PRN

## 2018-08-01 MED ORDER — METOPROLOL SUCCINATE ER 50 MG PO TB24
100.0000 mg | ORAL_TABLET | Freq: Every day | ORAL | Status: DC
  Administered 2018-08-02 – 2018-08-21 (×16): 100 mg via ORAL
  Filled 2018-08-01 (×21): qty 2

## 2018-08-01 MED ORDER — GUAIFENESIN-DM 100-10 MG/5ML PO SYRP: 5.0000 mL | ORAL_SOLUTION | Freq: Four times a day (QID) | ORAL | Status: DC | PRN

## 2018-08-01 MED ORDER — DILTIAZEM HCL ER COATED BEADS 180 MG PO CP24
360.0000 mg | ORAL_CAPSULE | Freq: Every day | ORAL | Status: DC
  Administered 2018-08-02 – 2018-08-06 (×4): 360 mg via ORAL
  Filled 2018-08-01 (×5): qty 2

## 2018-08-01 MED ORDER — POLYETHYLENE GLYCOL 3350 17 G PO PACK: 17.0000 g | PACK | Freq: Every day | ORAL | Status: DC | PRN

## 2018-08-01 MED ORDER — FLEET ENEMA 7-19 GM/118ML RE ENEM: 1.0000 | ENEMA | Freq: Once | RECTAL | Status: DC | PRN

## 2018-08-01 MED ORDER — ONDANSETRON HCL 4 MG PO TABS: 4.0000 mg | ORAL_TABLET | Freq: Four times a day (QID) | ORAL | Status: DC | PRN

## 2018-08-01 MED ORDER — ACETAMINOPHEN 325 MG PO TABS
325.0000 mg | ORAL_TABLET | ORAL | Status: DC | PRN
  Administered 2018-08-04 – 2018-08-19 (×8): 650 mg via ORAL
  Filled 2018-08-01 (×8): qty 2

## 2018-08-01 NOTE — TOC Transition Note (Signed)
Transition of Care [facility]) - CM/SW Discharge Note   Patient Details  Name: [name] Frank MRN: [id number] Date of Birth: 1951/01/16  Transition of [facility]) CM/SW Contact:  [name], RN Phone Number: 08/01/2018, 12:07 PM   Clinical Narrative:    Pt discharging to CIR today. CM signing off.   Final next level of care: IP Rehab Facility Barriers to Discharge: Barriers Resolved   Patient Goals and CMS Choice        Discharge Placement                       Discharge Plan and Services                                     Social Determinants of Health (SDOH) Interventions     Readmission Risk Interventions Readmission Risk Prevention Plan 07/18/2018  Post Dischage Appt Not Complete  Appt Comments PCP refused to set up with RN Case manager; prefers to call pt  Medication Screening Complete  Transportation Screening Complete  Some recent data might be hidden

## 2018-08-01 NOTE — H&P (Signed)
Physical Medicine and Rehabilitation Admission H&P     CC: Functional deficits due to seizures/TBI     HPI:  [name] Frank is a 68 year old male with history of T2DM, HTN, CA, fall down some stairs 07/14/18 with subsequent TBI/SDH and rib fractures.  Hospital course significant for A. fib with RVR and he was started on low-dose ASA prior to discharge on 07/22/2018.  He was readmitted on 07/28/2018 with 3-day history of progressive lethargy with acute onset of right facial droop and slurred speech.  He was noted to be hypotensive with acute renal failure and has had waxing and waning of speech difficulty.  Neurology recommended full work-up including ruling out infection, and EEG to rule out seizures. CT of head done grossly stable left frontal parietal SDH, stable right occipital SAH with concerns of developing communicating hydrocephalus.  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  MRI brain 5/19 showed persistent ventriculomegaly with suspicion of developing communicating hydrocephalus and stable left SDH with mass-effect on left hemisphere. ASA d/c due to concerns of need for surgery.    Dr. Johnsie Cancel did not feel that surgery was indicated at this time. NS felt that waxing and waning of symptoms felt to be due to seizures and no clinical signs of hydrocephalus. EEG revealed left hemispheric slowing with rare sharp left temporal waves suggestive of epileptogenic potential.  He was loaded with Keppra and started started on 500 mg p.o. twice daily.   Scrotal edema with erythema noted on 5/19 and ultrasound revealed large left sided hydrocele and small to moderate right hydrocele therefore Augmentin added for treatment.  He continues to have issues with dizziness, significant cognitive linguistic deficits with perseveration and confusion, weakness affecting mobility as well as ability to carry out ADLs.  CIR recommended due to  functional decline.      Review of Systems  Constitutional: Negative for fever.  HENT: Negative for hearing loss.   Eyes: Negative for photophobia.  Respiratory: Negative for cough.   Cardiovascular: Negative for chest pain.  Gastrointestinal: Negative for nausea and vomiting.  Genitourinary: Negative for dysuria.  Skin: Negative for rash.  Neurological: Positive for weakness. Negative for sensory change and headaches.  Psychiatric/Behavioral: Negative for depression.            Past Medical History:  Diagnosis Date   Diabetes mellitus     Heart disease     Hyperlipemia     Hypertension           Past Surgical History:  Procedure Laterality Date   catarracts       OTHER SURGICAL HISTORY   2016    HEART STENT   TONSILLECTOMY             Family History  Problem Relation Age of Onset   Stroke Father     Hypertension Father     Hyperlipidemia Father     Early death Mother     Colitis Sister     Appendicitis Brother      Social History:  reports that he has never smoked. He has never used smokeless tobacco. He reports current alcohol use. He reports that he does not use drugs. Allergies: No Known Allergies       Medications Prior to Admission  Medication Sig Dispense Refill   acetaminophen (TYLENOL) 325 MG tablet Take 2 tablets (650 mg total) by mouth every 6 (six) hours as needed  for mild pain.       aspirin 81 MG chewable tablet Chew 81 mg by mouth daily.         atorvastatin (LIPITOR) 80 MG tablet Take 1 tablet (80 mg total) by mouth daily. 90 tablet 3   diltiazem (CARDIZEM CD) 360 MG 24 hr capsule Take 1 capsule (360 mg total) by mouth daily. 30 capsule 0   gabapentin (NEURONTIN) 300 MG capsule Take 1 capsule (300 mg total) by mouth 3 (three) times daily. 90 capsule 0   glipiZIDE (GLUCOTROL XL) 10 MG 24 hr tablet Take 1 tablet (10 mg total) by mouth 2 (two) times daily. 180 tablet 3   INVOKAMET [phone] MG TABS Take 1 tablet by mouth 2 (two)  times daily. 180 tablet 3   losartan (COZAAR) 50 MG tablet Take 1 tablet (50 mg total) by mouth daily. 90 tablet 3   methocarbamol (ROBAXIN) 750 MG tablet Take 1 tablet (750 mg total) by mouth 3 (three) times daily. 60 tablet 0   metoprolol succinate (TOPROL-XL) 100 MG 24 hr tablet Take 1 tablet (100 mg total) by mouth daily. Take with or immediately following a meal. 30 tablet 0   traMADol (ULTRAM) 50 MG tablet Take 1-2 tablets (50-100 mg total) by mouth every 6 (six) hours as needed for moderate pain or severe pain. 30 tablet 0   guaiFENesin (MUCINEX) 600 MG 12 hr tablet Take 1 tablet (600 mg total) by mouth 2 (two) times daily. (Patient not taking: Reported on 07/28/2018)          Drug Regimen Review  Drug regimen was reviewed and remains appropriate with no significant issues identified   Home: Home Living Family/patient expects to be discharged to:: Private residence Living Arrangements: Spouse/significant other Available Help at Discharge: Family, Available 24 hours/day Type of Home: House Home Access: Stairs to enter [employer] of Steps: 5 Entrance Stairs-Rails: (bil) Home Layout: Multi-level, Able to live on main level with bedroom/bathroom(hospital bed on 1st floor) Bathroom Shower/Tub: Health visitor: Handicapped height Home Equipment: Environmental consultant - 2 wheels, Environmental consultant - 4 wheels, Bedside commode  Lives With: Spouse   Functional History: Prior Function Level of Independence: Needs assistance Gait / Transfers Assistance Needed: needing a little assist with transfers, but able to ambulate with RW with min guard assist  ADL's / Homemaking Assistance Needed: needing a little assist with UB ADLs, mod assist with LB ADLs, and supervision for toileting  Comments: Pt reports he is retired from [employer] 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 Continues to require multimodal cues for sequencing when performing mobility tasks. Pt reporting dizziness upon sitting and BP at 118/73 mmHg.  Transfers Overall transfer level: Needs assistance Equipment used: Rolling walker (2 wheeled) Transfers: Sit to/from Stand Sit to Stand: Mod assist Stand pivot transfers: Min assist  Lateral/Scoot Transfers: Mod assist General transfer comment: Mod A for lift assist and steadying to stand. Upon standing, pt reporting increased dizziness. Checked BP, and BP at 108/75 mmHg. Pt requesting to sit back down and return to bed. Was able to stand for ~2-3 mins for BP to take. Required mod A to assist with lateral scoots X3-4  to scoot up towards HOB. Required mod A for assist with scooting and multimodal cues for sequencing. ' Ambulation/Gait Ambulation/Gait assistance: Min assist Gait Distance (Feet): 10 Feet(x2 (to/from bathroom)) Assistive device: Rolling walker (2 wheeled) Gait Pattern/deviations: Step-through pattern, Decreased dorsiflexion - right, Decreased dorsiflexion - left, Trunk flexed General Gait Details: pt with extremely short shuffled steps, max directional v/c's, modA for walker management Gait velocity: decreased Gait velocity interpretation: <1.8 ft/sec, indicate of risk for recurrent falls Stairs assistance: Min assist Stair Management: One rail Right General stair comments: cues for sequencing (up with left foot, down with right foot). step by step sequencing   ADL: ADL Overall ADL's : Needs assistance/impaired Grooming: Minimal assistance, Sitting Upper Body Bathing: Sitting, Moderate assistance Lower Body Bathing: Sit to/from stand, Total assistance, +2 for safety/equipment Upper Body Dressing : Moderate assistance,  Sitting Lower Body Dressing: Total assistance, +2 for safety/equipment, Sit to/from stand Toilet Transfer: Minimal assistance, Moderate assistance, RW, Ambulation, +2 for physical assistance, +2 for safety/equipment Toileting- Clothing Manipulation and Hygiene: Maximal assistance, Sit to/from stand Functional mobility during ADLs: Moderate assistance, Rolling walker General ADL Comments: pt limited by cognition, pain and balance   Cognition: Cognition Overall Cognitive Status: Impaired/Different from baseline Orientation Level: Oriented X4 Attention: Sustained Focused Attention: Appears intact Sustained Attention: Impaired Sustained Attention Impairment: Functional basic, Verbal basic(pt easily distracted by discomfort from leg pain, decreased sustained attention to less than 1 minute during session) Memory: Impaired Memory Impairment: Storage deficit, Retrieval deficit, Decreased short term memory, Decreased recall of new information(pt did not recall information neurologist had shared with him within 3 minutes after leaving despite SLP verbal cues) Decreased Short Term Memory: Verbal basic(see above) Awareness: Impaired Awareness Impairment: Intellectual impairment Problem Solving: Impaired Problem Solving Impairment: Functional basic(to locate call bell and contact RN for pain medicine) Executive Function: (pt is not at this level at this time) Behaviors: Restless, Perseveration, Impulsive Safety/Judgment: Impaired Cognition Arousal/Alertness: Awake/alert Behavior During Therapy: Flat affect Overall Cognitive Status: Impaired/Different from baseline Area of Impairment: Attention, Memory, Following commands, Safety/judgement, Awareness, Problem solving Orientation Level: Disoriented to, Time Current Attention Level: Sustained Memory: Decreased recall of precautions, Decreased short-term memory Following Commands: Follows one step commands with increased time Safety/Judgement:  Decreased awareness of deficits, Decreased awareness of safety Awareness: Intellectual Problem Solving: Slow processing, Difficulty sequencing, Decreased initiation, Requires tactile cues, Requires verbal cues General Comments: Pt frequently self distracts, and requires cues to redirect to task. Required multimodal cues for sequencing throughout mobility.      Blood pressure (!) 122/59, pulse (!) 51, temperature 97.6 F (36.4 C), temperature source Oral, resp. rate 16, height 5\' 9"  (1.753 m), weight 97.4 kg, SpO2 98 %. Physical Exam  Constitutional:  obese  Cardiovascular: Regular rhythm. Exam reveals no friction rub.  No murmur heard. Sl bradycardia  Respiratory: Effort normal. No respiratory distress. He has no wheezes.  GI: Soft. He exhibits no distension. There is no abdominal tenderness.  Musculoskeletal: Normal range of motion.        General: No deformity or edema.  Neurological: He is alert. No cranial nerve deficit.  Oriented to place, person, month, reason he was here. Provided biographical information. Fair insight and awareness. UE 4/5 prox to distal. LE: 4-/5 HF, 4/5 KE and 4/5 ADF/PF. No focal sensory findings. No limb ataxia seen.   Psychiatric:  Flat, fairly cooperative.       Lab Results Last 48 Hours        Results for orders placed or performed during  the hospital encounter of 07/28/18 (from the past 48 hour(s))  Glucose, capillary     Status: Abnormal    Collection Time: 07/30/18 11:25 AM  Result Value Ref Range    Glucose-Capillary 154 (H) 70 - 99 mg/dL  Glucose, capillary     Status: Abnormal    Collection Time: 07/30/18  3:39 PM  Result Value Ref Range    Glucose-Capillary 152 (H) 70 - 99 mg/dL  Glucose, capillary     Status: Abnormal    Collection Time: 07/30/18  7:29 PM  Result Value Ref Range    Glucose-Capillary 104 (H) 70 - 99 mg/dL    Comment 1 Notify RN      Comment 2 Document in Chart    Glucose, capillary     Status: None    Collection Time:  07/30/18 11:36 PM  Result Value Ref Range    Glucose-Capillary 87 70 - 99 mg/dL    Comment 1 Notify RN      Comment 2 Document in Chart    Glucose, capillary     Status: Abnormal    Collection Time: 07/31/18  3:41 AM  Result Value Ref Range    Glucose-Capillary 110 (H) 70 - 99 mg/dL    Comment 1 Notify RN      Comment 2 Document in Chart    CBC     Status: None    Collection Time: 07/31/18  7:30 AM  Result Value Ref Range    WBC 5.4 4.0 - 10.5 K/uL    RBC 4.78 4.22 - 5.81 MIL/uL    Hemoglobin 14.0 13.0 - 17.0 g/dL    HCT 16.1 09.6 - 04.5 %    MCV 88.5 80.0 - 100.0 fL    MCH 29.3 26.0 - 34.0 pg    MCHC 33.1 30.0 - 36.0 g/dL    RDW 40.9 81.1 - 91.4 %    Platelets 239 150 - 400 K/uL    nRBC 0.0 0.0 - 0.2 %      Comment: Performed at [facility], [address] [place], [place] [address]  Basic metabolic panel     Status: Abnormal    Collection Time: 07/31/18  7:30 AM  Result Value Ref Range    Sodium 135 135 - 145 mmol/L    Potassium 4.1 3.5 - 5.1 mmol/L    Chloride 101 98 - 111 mmol/L    CO2 17 (L) 22 - 32 mmol/L    Glucose, Bld 131 (H) 70 - 99 mg/dL    BUN 16 8 - 23 mg/dL    Creatinine, Ser 6.21 0.61 - 1.24 mg/dL    Calcium 8.9 8.9 - 30.8 mg/dL    GFR calc non Af Amer >60 >60 mL/min    GFR calc Af Amer >60 >60 mL/min    Anion gap 17 (H) 5 - 15      Comment: Performed at [facility], [address] [place], [place] [address]  Glucose, capillary     Status: Abnormal    Collection Time: 07/31/18  7:35 AM  Result Value Ref Range    Glucose-Capillary 115 (H) 70 - 99 mg/dL  Glucose, capillary     Status: Abnormal    Collection Time: 07/31/18 11:57 AM  Result Value Ref Range    Glucose-Capillary 162 (H) 70 - 99 mg/dL  Glucose, capillary     Status: Abnormal    Collection Time: 07/31/18  4:32 PM  Result Value Ref Range  Glucose-Capillary 119 (H) 70 - 99 mg/dL    Comment 1 Notify RN      Comment 2 Document in Chart    Glucose, capillary     Status:  Abnormal    Collection Time: 07/31/18  8:12 PM  Result Value Ref Range    Glucose-Capillary 147 (H) 70 - 99 mg/dL  Glucose, capillary     Status: Abnormal    Collection Time: 07/31/18 11:16 PM  Result Value Ref Range    Glucose-Capillary 129 (H) 70 - 99 mg/dL  CBC     Status: None    Collection Time: 08/01/18  4:23 AM  Result Value Ref Range    WBC 4.9 4.0 - 10.5 K/uL    RBC 4.63 4.22 - 5.81 MIL/uL    Hemoglobin 13.6 13.0 - 17.0 g/dL    HCT 86.7 67.2 - 09.4 %    MCV 86.2 80.0 - 100.0 fL    MCH 29.4 26.0 - 34.0 pg    MCHC 34.1 30.0 - 36.0 g/dL    RDW 70.9 62.8 - 36.6 %    Platelets 242 150 - 400 K/uL    nRBC 0.0 0.0 - 0.2 %      Comment: Performed at [facility], [address], [place], [place] [address]  Basic metabolic panel     Status: Abnormal    Collection Time: 08/01/18  4:23 AM  Result Value Ref Range    Sodium 138 135 - 145 mmol/L    Potassium 3.9 3.5 - 5.1 mmol/L    Chloride 103 98 - 111 mmol/L    CO2 19 (L) 22 - 32 mmol/L    Glucose, Bld 124 (H) 70 - 99 mg/dL    BUN 16 8 - 23 mg/dL    Creatinine, Ser 5.46 0.61 - 1.24 mg/dL    Calcium 8.9 8.9 - 50.3 mg/dL    GFR calc non Af Amer >60 >60 mL/min    GFR calc Af Amer >60 >60 mL/min    Anion gap 16 (H) 5 - 15      Comment: Performed at [facility], [address] [place], [place] [address]  Glucose, capillary     Status: Abnormal    Collection Time: 08/01/18  4:31 AM  Result Value Ref Range    Glucose-Capillary 167 (H) 70 - 99 mg/dL  Glucose, capillary     Status: Abnormal    Collection Time: 08/01/18  8:11 AM  Result Value Ref Range    Glucose-Capillary 123 (H) 70 - 99 mg/dL    Comment 1 Notify RN      Comment 2 Document in Chart        Imaging Results (Last 48 hours)  No results found.           Medical Problem List and Plan: 1.  Functional decline secondary to AMS, new onset seizures and recent TBI (left frontal SDH, right occipital SAH) with subsequent hydrocephalus.              -admit  to inpatient rehab             -provided patient information regarding prognosis, his type of injury, expected course. 2.  Antithrombotics: -DVT/anticoagulation:  Mechanical: Sequential compression devices, below knee Bilateral lower extremities             -antiplatelet therapy: N/A 3. Pain Management: Tylenol prn.  4. Mood: LCSW to follow for evaluation and support.              -  antipsychotic agents: N/A 5. Neuropsych: This patient is not capable of making decisions on his own behalf. 6. Skin/Wound Care: routine pressure relief measures.  7. Fluids/Electrolytes/Nutrition: Monitor I/O. Check lytes in am. 8. T2DM: Monitor BS ac/hs. Continue to hold glucotrol for now and use SSI for elevated BS. Adjust regimen as needed.  9. CAD/PAF/A flutter: Off Plavix due to SDH. Monitor HR bid. Continue Cardizem CD and Toprol XL. Will order orthostatic vitals due to reports of dizziness. Order TEDs. Continue to hold Losartan. Pt with regular rhythm at present 10. Left > Right hydrocele: On Augmentin D# 4 11. AKI with hyponateremia: Has resolved with hydration. Recheck on Monday.  12. New onset seizures: Continue Keppra 500 mg bid.         Post Admission Physician Evaluation: 1. Functional deficits secondary  to TBI, hydrocephalus. 2. Patient is admitted to receive collaborative, interdisciplinary care between the physiatrist, rehab nursing staff, and therapy team. 3. Patient's level of medical complexity and substantial therapy needs in context of that medical necessity cannot be provided at a lesser intensity of care such as a SNF. 4. Patient has experienced substantial functional loss from his/her baseline which was documented above under the "Functional History" and "Functional Status" headings.  Judging by the patient's diagnosis, physical exam, and functional history, the patient has potential for functional progress which will result in measurable gains while on inpatient rehab.  These gains will be  of substantial and practical use upon discharge  in facilitating mobility and self-care at the household level. 5. Physiatrist will provide 24 hour management of medical needs as well as oversight of the therapy plan/treatment and provide guidance as appropriate regarding the interaction of the two. 6. The Preadmission Screening has been reviewed and patient status is unchanged unless otherwise stated above. 7. 24 hour rehab nursing will assist with bladder management, bowel management, safety, skin/wound care, disease management, medication administration, pain management and patient education  and help integrate therapy concepts, techniques,education, etc. 8. PT will assess and treat for/with: Lower extremity strength, range of motion, stamina, balance, functional mobility, safety, adaptive techniques and equipment, NMR.   Goals are: supervision. 9. OT will assess and treat for/with: ADL's, functional mobility, safety, upper extremity strength, adaptive techniques and equipment, NMR.   Goals are: supervision. Therapy may proceed with showering this patient. 10. SLP will assess and treat for/with: n/a.  Goals are: n/a. 11. Case Management and Social Worker will assess and treat for psychological issues and discharge planning. 12. Team conference will be held weekly to assess progress toward goals and to determine barriers to discharge. 13. Patient will receive at least 3 hours of therapy per day at least 5 days per week. 14. ELOS: 10-14 days       15. Prognosis:  excellent   I have personally performed a face to face diagnostic evaluation of this patient and formulated the key components of the plan.  Additionally, I have personally reviewed laboratory data, imaging studies, as well as relevant notes and concur with the physician assistant's documentation above.  Ranelle Oyster, MD, [place] [facility]   Jacquelynn Cree, PA-C 08/01/2018

## 2018-08-01 NOTE — Progress Notes (Signed)
Meredith Staggers, MD  Physician  Physical Medicine and Rehabilitation  PMR Pre-admission  Signed  Date of Service:  07/31/2018 3:34 PM       Related encounter: ED to Hosp-Admission (Current) from 07/28/2018 in [facility]      Signed         Show:Clear all _0 Manual_1 Template_2 Copied  Added by: _3 Meredith Staggers, MD_4 Michel Santee, PT  _5 Hover for details PMR Admission Coordinator Pre-Admission Assessment  Patient: [name] Frank is an 68 y.o., male MRN: [id number] DOB: [birth year] Height: _6  (175.3 cm) Weight: 97.4 kg  Insurance Information HMO: yes    PPO:      PCP:      IPA:      80/20:      OTHER:  PRIMARY: [employer]      Policy#: [id number]      Subscriber: patient CM Name: n/a      Phone#:      Fax#: [phone] Pre-Cert#: [id number] case auto-approved on [email or web address] for admission on 08/01/2018 with updates due on Thursday 08/07/2018 to concurrent review case manager at [phone]      Employer:  Benefits:  Phone #: [phone]     Name:  Eff. Date: 03/12/2018     Deduct: $0      Out of Pocket Max: $3400 ($0)      Life Max: n/a CIR: $295/day for days 1-6      SNF: 20 full days Outpatient: $10-$40/visit     Co-Pay:  Home Health: 100%      Co-Pay:  DME: 80%     Co-Pay: 20% Providers: in network   SECONDARY:       Policy#:       Subscriber:  CM Name:       Phone#:      Fax#:  Pre-Cert#:       Employer:  Benefits:  Phone #:      Name:  Eff. Date:      Deduct:       Out of Pocket Max:       Life Max:  CIR:       SNF:  Outpatient:      Co-Pay:  Home Health:       Co-Pay:  DME:      Co-Pay:   Medicaid Application Date:       Case Manager:  Disability Application Date:       Case Worker:   The "Data Collection Information Summary" for patients in Inpatient Rehabilitation Facilities with attached "Privacy Act Intercourse Records" was provided and verbally reviewed with: Family  Emergency Contact Information       Contact Information    Name Relation Home Work Mobile   Rullo,LYNDA    [phone]      Current Medical History  Patient Admitting Diagnosis: AMS with recent TBI  History of Present Illness: Pt is a 68 y/o male with PMH of HTN, diabetes, CAD, and afrib recently admitted to [facility] following a fall with traumatic subarachnoid hemorrhage and subdural hematoma (5/4-5/12) recommendations for home with home health services.  Pt readmitted on 5/18 with worsening lethargy, aphasia, and worsening AMS.  Imaging revealed stable SDH with evolution of hydrocephalus.  EEG showing focal epileptiform activity, thought to be 2/2 hematoma breakdown products.  Conservative management per neurosurgery.  Therapy evaluations completed and revealed pt with mobility, as well as cognitive and language deficits.  Recommendations were made for CIR.   Complete NIHSS TOTAL: 6  Patient's medical record from [facility] has been reviewed by the rehabilitation admission coordinator and physician.  Past Medical History      Past Medical History:  Diagnosis Date  . Diabetes mellitus   . Heart disease   . Hyperlipemia   . Hypertension     Family History   family history includes Appendicitis in his brother; Colitis in his sister; Early death in his mother; Hyperlipidemia in his father; Hypertension in his father; Stroke in his father.  Prior Rehab/Hospitalizations Has the patient had prior rehab or hospitalizations prior to admission? Yes  Has the patient had major surgery during 100 days prior to admission? No              Current Medications  Current Facility-Administered Medications:  .  0.9 %  sodium chloride infusion, , Intravenous, Continuous, Regalado, Belkys A, MD, Last Rate: 75 mL/hr at 08/01/18 0646 .  acetaminophen (TYLENOL) tablet 650 mg, 650 mg, Oral, Q6H PRN, 650 mg at 07/31/18 0749 **OR** acetaminophen (TYLENOL) suppository 650 mg, 650 mg, Rectal, Q6H PRN, Opyd,  Ilene Qua, MD .  amoxicillin-clavulanate (AUGMENTIN) 875-125 MG per tablet 1 tablet, 1 tablet, Oral, Q12H, Mariel Aloe, MD, 1 tablet at 08/01/18 [phone] .  atorvastatin (LIPITOR) tablet 80 mg, 80 mg, Oral, q1800, Opyd, Ilene Qua, MD, 80 mg at 07/31/18 1659 .  diltiazem (CARDIZEM CD) 24 hr capsule 360 mg, 360 mg, Oral, Daily, Regalado, Belkys A, MD, 360 mg at 07/31/18 1014 .  fentaNYL (SUBLIMAZE) injection 12.5-25 mcg, 12.5-25 mcg, Intravenous, Q2H PRN, Opyd, Timothy S, MD .  hydrALAZINE (APRESOLINE) injection 5 mg, 5 mg, Intravenous, Q4H PRN, Opyd, Timothy S, MD .  insulin aspart (novoLOG) injection 0-9 Units, 0-9 Units, Subcutaneous, Q4H, Opyd, Ilene Qua, MD, 1 Units at 08/01/18 [phone] .  levETIRAcetam (KEPPRA) tablet 500 mg, 500 mg, Oral, BID, Regalado, Belkys A, MD, 500 mg at 08/01/18 0833 .  metoprolol succinate (TOPROL-XL) 24 hr tablet 100 mg, 100 mg, Oral, Daily, Opyd, Ilene Qua, MD, 100 mg at 07/31/18 1014 .  ondansetron (ZOFRAN) tablet 4 mg, 4 mg, Oral, Q6H PRN **OR** ondansetron (ZOFRAN) injection 4 mg, 4 mg, Intravenous, Q6H PRN, Opyd, Timothy S, MD .  polyethylene glycol (MIRALAX / GLYCOLAX) packet 17 g, 17 g, Oral, Daily PRN, Opyd, Timothy S, MD .  sodium chloride flush (NS) 0.9 % injection 3 mL, 3 mL, Intravenous, Once, Opyd, Timothy S, MD .  sodium chloride flush (NS) 0.9 % injection 3 mL, 3 mL, Intravenous, Q12H, Opyd, Timothy S, MD, 3 mL at 07/31/18 2057  Patients Current Diet:     Diet Order                  Diet - low sodium heart healthy         Diet Carb Modified Fluid consistency: Thin; Room service appropriate? No  Diet effective now               Precautions / Restrictions Precautions Precautions: Fall Restrictions Weight Bearing Restrictions: No   Has the patient had 2 or more falls or a fall with injury in the past year? Yes  Prior Activity Level Community (5-7x/wk): active, working prior to his initial fall in April, driving  Prior  Functional Level Self Care: Did the patient need help bathing, dressing, using the toilet or eating? Independent  Indoor Mobility: Did the patient need assistance with walking from room to room (with or without device)? Independent  Stairs: Did the patient  need assistance with internal or external stairs (with or without device)? Independent  Functional Cognition: Did the patient need help planning regular tasks such as shopping or remembering to take medications? Independent, note: pt helped his son do his taxes between his initial d/c in April and readmit in May  Home Assistive Devices / [place] Devices/Equipment: Wheelchair, Environmental consultant (specify type) Home Equipment: Environmental consultant - 2 wheels, Walker - 4 wheels, Bedside commode  Prior Device Use: Indicate devices/aids used by the patient prior to current illness, exacerbation or injury? None of the above  Current Functional Level Cognition  Overall Cognitive Status: Impaired/Different from baseline Current Attention Level: Sustained Orientation Level: Oriented X4 Following Commands: Follows one step commands with increased time Safety/Judgement: Decreased awareness of deficits, Decreased awareness of safety General Comments: Pt frequently self distracts, and requires cues to redirect to task. Required multimodal cues for sequencing throughout mobility.  Attention: Sustained Focused Attention: Appears intact Sustained Attention: Impaired Sustained Attention Impairment: Functional basic, Verbal basic(pt easily distracted by discomfort from leg pain, decreased sustained attention to less than 1 minute during session) Memory: Impaired Memory Impairment: Storage deficit, Retrieval deficit, Decreased short term memory, Decreased recall of new information(pt did not recall information neurologist had shared with him within 3 minutes after leaving despite SLP verbal cues) Decreased Short Term Memory: Verbal basic(see above)  Awareness: Impaired Awareness Impairment: Intellectual impairment Problem Solving: Impaired Problem Solving Impairment: Functional basic(to locate call bell and contact RN for pain medicine) Executive Function: (pt is not at this level at this time) Behaviors: Restless, Perseveration, Impulsive Safety/Judgment: Impaired    Extremity Assessment (includes Sensation/Coordination)  Upper Extremity Assessment: RUE deficits/detail RUE Deficits / Details: decreased ROM and poor coordination RUE Coordination: decreased fine motor, decreased gross motor  Lower Extremity Assessment: Defer to PT evaluation    ADLs  Overall ADL's : Needs assistance/impaired Grooming: Minimal assistance, Sitting Upper Body Bathing: Sitting, Moderate assistance Lower Body Bathing: Sit to/from stand, Total assistance, +2 for safety/equipment Upper Body Dressing : Moderate assistance, Sitting Lower Body Dressing: Total assistance, +2 for safety/equipment, Sit to/from stand Toilet Transfer: Minimal assistance, Moderate assistance, RW, Ambulation, +2 for physical assistance, +2 for safety/equipment Toileting- Clothing Manipulation and Hygiene: Maximal assistance, Sit to/from stand Functional mobility during ADLs: Moderate assistance, Rolling walker General ADL Comments: pt limited by cognition, pain and balance    Mobility  Overal bed mobility: Needs Assistance Bed Mobility: Sidelying to Sit, Sit to Supine Rolling: Mod assist Sidelying to sit: Mod assist Supine to sit: Min assist Sit to supine: Mod assist General bed mobility comments: Mod A for bed mobility tasks. Continues to require multimodal cues for sequencing when performing mobility tasks. Pt reporting dizziness upon sitting and BP at 118/73 mmHg.     Transfers  Overall transfer level: Needs assistance Equipment used: Rolling walker (2 wheeled) Transfers: Sit to/from Stand Sit to Stand: Mod assist Stand pivot transfers: Min assist   Lateral/Scoot Transfers: Mod assist General transfer comment: Mod A for lift assist and steadying to stand. Upon standing, pt reporting increased dizziness. Checked BP, and BP at 108/75 mmHg. Pt requesting to sit back down and return to bed. Was able to stand for ~2-3 mins for BP to take. Required mod A to assist with lateral scoots X3-4 to scoot up towards [place]. Required mod A for assist with scooting and multimodal cues for sequencing. '    Ambulation / Gait / Stairs / Wheelchair Mobility  Ambulation/Gait Ambulation/Gait assistance: Herbalist (Feet): 10 Feet(x2 (  to/from bathroom)) Assistive device: Rolling walker (2 wheeled) Gait Pattern/deviations: Step-through pattern, Decreased dorsiflexion - right, Decreased dorsiflexion - left, Trunk flexed General Gait Details: pt with extremely short shuffled steps, max directional v/c's, modA for walker management Gait velocity: decreased Gait velocity interpretation: <1.8 ft/sec, indicate of risk for recurrent falls Stairs assistance: Min assist Stair Management: One rail Right General stair comments: cues for sequencing (up with left foot, down with right foot). step by step sequencing    Posture / Balance Dynamic Sitting Balance Sitting balance - Comments: min guard for safety statically Balance Overall balance assessment: Needs assistance, History of Falls Sitting-balance support: Feet supported Sitting balance-Leahy Scale: Fair Sitting balance - Comments: min guard for safety statically Standing balance support: Bilateral upper extremity supported, During functional activity Standing balance-Leahy Scale: Poor Standing balance comment: reliant on B UE and external support    Special needs/care consideration BiPAP/CPAP no CPM no Continuous Drip IV no Dialysis no        Days n/a Life Vest no Oxygen no Special Bed no Trach Size no Wound Vac (area) no      Location n/a Skin abrasion to bil UEs/LEs, ecchymosis to R hip  Bowel mgmt: incontinent, last BM 07/28/2018 Bladder mgmt: incontinent Diabetic mgmt: yes Behavioral consideration TBI Chemo/radiation no   Previous Home Environment (from acute therapy documentation) Living Arrangements: Spouse/significant other  Lives With: Spouse Available Help at Discharge: Family, Available 24 hours/day Type of Home: House Home Layout: Multi-level, Able to live on main level with bedroom/bathroom(hospital bed on 1st floor) Home Access: Stairs to enter Entrance Stairs-Rails: (bil) Entrance Stairs-Number of Steps: 5 Bathroom Shower/Tub: Multimedia programmer: Handicapped height Home Care Services: No  Discharge Living Setting Plans for Discharge Living Setting: Patient's home Type of Home at Discharge: House Discharge Home Layout: Two level, Able to live on main level with bedroom/bathroom Discharge Home Access: Stairs to enter Entrance Stairs-Rails: Left, Right Entrance Stairs-Number of Steps: 5 Discharge Bathroom Shower/Tub: Walk-in shower Discharge Bathroom Toilet: Handicapped height Discharge Bathroom Accessibility: Yes How Accessible: Accessible via walker Does the patient have any problems obtaining your medications?: No  Social/Family/Support Systems Patient Roles: Spouse, Parent Anticipated Caregiver: pt's wife, [name] Anticipated Caregiver's Contact Information: [phone] Ability/Limitations of Caregiver: min assist, she has been providing this since his discharge in April Caregiver Availability: 24/7 Discharge Plan Discussed with Primary Caregiver: Yes Is Caregiver In Agreement with Plan?: Yes Does Caregiver/Family have Issues with Lodging/Transportation while Pt is in Rehab?: No  Goals/Additional Needs Patient/Family Goal for Rehab: PT/OT/SLP supervision Expected length of stay: 10-14 days Dietary Needs: carb modified Equipment Needs: tbd Pt/Family Agrees to Admission and willing to participate: Yes Program Orientation  Provided & Reviewed with Pt/Caregiver Including Roles  & Responsibilities: Yes  Possible need for SNF placement upon discharge: no  Patient Condition: I have reviewed medical records from [facility], spoken with CM, and patient and spouse. I met with patient at the bedside and discussed with wife via phone for inpatient rehabilitation assessment.  Patient will benefit from ongoing PT, OT and SLP, can actively participate in 3 hours of therapy a day 5 days of the week, and can make measurable gains during the admission.  Patient will also benefit from the coordinated team approach during an Inpatient Acute Rehabilitation admission.  The patient will receive intensive therapy as well as Rehabilitation physician, nursing, social worker, and care management interventions.  Due to bladder management, bowel management, safety, skin/wound care, disease management, medication administration, pain management and  patient education the patient requires 24 hour a day rehabilitation nursing.  The patient is currently mod assist with mobility and basic ADLs.  Discharge setting and therapy post discharge at home with home health is anticipated.  Patient has agreed to participate in the Acute Inpatient Rehabilitation Program and will admit today.  Preadmission Screen Completed By:  Michel Santee, PT, DPT 08/01/2018 11:22 AM ______________________________________________________________________   Discussed status with Dr. Naaman Plummer on 08/01/18  at 11:22 AM  and received approval for admission today.  Admission Coordinator:  Michel Santee, PT, DPT 11:22 AM Sudie Grumbling 08/01/18    Assessment/Plan: Diagnosis: TBI 1. Does the need for close, 24 hr/day Medical supervision in concert with the patient's rehab needs make it unreasonable for this patient to be served in a less intensive setting? Yes 2. Co-Morbidities requiring supervision/potential complications: HTN, DM, afib 3. Due to bladder management, bowel  management, safety, skin/wound care, disease management, medication administration, pain management and patient education, does the patient require 24 hr/day rehab nursing? Yes 4. Does the patient require coordinated care of a physician, rehab nurse, PT (1-2 hrs/day, 5 days/week), OT (1-2 hrs/day, 5 days/week) and SLP (1-2 hrs/day, 5 days/week) to address physical and functional deficits in the context of the above medical diagnosis(es)? Yes Addressing deficits in the following areas: balance, endurance, locomotion, strength, transferring, bowel/bladder control, bathing, dressing, feeding, grooming, toileting, cognition and psychosocial support 5. Can the patient actively participate in an intensive therapy program of at least 3 hrs of therapy 5 days a week? Yes 6. The potential for patient to make measurable gains while on inpatient rehab is excellent 7. Anticipated functional outcomes upon discharge from inpatients are: supervision PT, supervision OT, supervision SLP 8. Estimated rehab length of stay to reach the above functional goals is: 10-14 d 9. Anticipated D/C setting: Home 10. Anticipated post D/C treatments: [facility] therapy 11. Overall Rehab/Functional Prognosis: excellent  MD Signature: Meredith Staggers, MD, [facility] 08/01/2018         Revision History

## 2018-08-01 NOTE — H&P (Signed)
Physical Medicine and Rehabilitation Admission H&P    CC: Functional deficits due to seizures/TBI   HPI:  [name] Frank is a 68 year old male with history of T2DM, HTN, CA, fall down some stairs 07/14/18 with subsequent TBI/SDH and rib fractures.  Hospital course significant for A. fib with RVR and he was started on low-dose ASA prior to discharge on 07/22/2018.  He was readmitted on 07/28/2018 with 3-day history of progressive lethargy with acute onset of right facial droop and slurred speech.  He was noted to be hypotensive with acute renal failure and has had waxing and waning of speech difficulty.  Neurology recommended full work-up including ruling out infection, and EEG to rule out seizures. CT of head done grossly stable left frontal parietal SDH, stable right occipital SAH with concerns of developing communicating hydrocephalus.  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  MRI brain 5/19 showed persistent ventriculomegaly with suspicion of developing communicating hydrocephalus and stable left SDH with mass-effect on left hemisphere. ASA d/c due to concerns of need for surgery.   Dr. Johnsie Cancel did not feel that surgery was indicated at this time. NS felt that waxing and waning of symptoms felt to be due to seizures and no clinical signs of hydrocephalus. EEG revealed left hemispheric slowing with rare sharp left temporal waves suggestive of epileptogenic potential.  He was loaded with Keppra and started started on 500 mg p.o. twice daily.   Scrotal edema with erythema noted on 5/19 and ultrasound revealed large left sided hydrocele and small to moderate right hydrocele therefore Augmentin added for treatment.  He continues to have issues with dizziness, significant cognitive linguistic deficits with perseveration and confusion, weakness affecting mobility as well as ability to carry out ADLs.  CIR recommended due to  functional decline.    Review of Systems  Constitutional: Negative for fever.  HENT: Negative for hearing loss.   Eyes: Negative for photophobia.  Respiratory: Negative for cough.   Cardiovascular: Negative for chest pain.  Gastrointestinal: Negative for nausea and vomiting.  Genitourinary: Negative for dysuria.  Skin: Negative for rash.  Neurological: Positive for weakness. Negative for sensory change and headaches.  Psychiatric/Behavioral: Negative for depression.      Past Medical History:  Diagnosis Date   Diabetes mellitus    Heart disease    Hyperlipemia    Hypertension    Past Surgical History:  Procedure Laterality Date   catarracts     OTHER SURGICAL HISTORY  2016   HEART STENT   TONSILLECTOMY     Family History  Problem Relation Age of Onset   Stroke Father    Hypertension Father    Hyperlipidemia Father    Early death Mother    Colitis Sister    Appendicitis Brother    Social History:  reports that he has never smoked. He has never used smokeless tobacco. He reports current alcohol use. He reports that he does not use drugs. Allergies: No Known Allergies Medications Prior to Admission  Medication Sig Dispense Refill   acetaminophen (TYLENOL) 325 MG tablet Take 2 tablets (650 mg total) by mouth every 6 (six) hours as needed for mild pain.     aspirin 81 MG chewable tablet Chew 81 mg by mouth daily.       atorvastatin (LIPITOR) 80 MG tablet Take 1 tablet (80 mg total) by mouth daily. 90 tablet 3   diltiazem (CARDIZEM CD) 360 MG 24  hr capsule Take 1 capsule (360 mg total) by mouth daily. 30 capsule 0   gabapentin (NEURONTIN) 300 MG capsule Take 1 capsule (300 mg total) by mouth 3 (three) times daily. 90 capsule 0   glipiZIDE (GLUCOTROL XL) 10 MG 24 hr tablet Take 1 tablet (10 mg total) by mouth 2 (two) times daily. 180 tablet 3   INVOKAMET [phone] MG TABS Take 1 tablet by mouth 2 (two) times daily. 180 tablet 3   losartan (COZAAR) 50 MG  tablet Take 1 tablet (50 mg total) by mouth daily. 90 tablet 3   methocarbamol (ROBAXIN) 750 MG tablet Take 1 tablet (750 mg total) by mouth 3 (three) times daily. 60 tablet 0   metoprolol succinate (TOPROL-XL) 100 MG 24 hr tablet Take 1 tablet (100 mg total) by mouth daily. Take with or immediately following a meal. 30 tablet 0   traMADol (ULTRAM) 50 MG tablet Take 1-2 tablets (50-100 mg total) by mouth every 6 (six) hours as needed for moderate pain or severe pain. 30 tablet 0   guaiFENesin (MUCINEX) 600 MG 12 hr tablet Take 1 tablet (600 mg total) by mouth 2 (two) times daily. (Patient not taking: Reported on 07/28/2018)      Drug Regimen Review  Drug regimen was reviewed and remains appropriate with no significant issues identified  Home: Home Living Family/patient expects to be discharged to:: Private residence Living Arrangements: Spouse/significant other Available Help at Discharge: Family, Available 24 hours/day Type of Home: House Home Access: Stairs to enter [employer] of Steps: 5 Entrance Stairs-Rails: (bil) Home Layout: Multi-level, Able to live on main level with bedroom/bathroom(hospital bed on 1st floor) Bathroom Shower/Tub: Health visitor: Handicapped height Home Equipment: Environmental consultant - 2 wheels, Environmental consultant - 4 wheels, Bedside commode  Lives With: Spouse   Functional History: Prior Function Level of Independence: Needs assistance Gait / Transfers Assistance Needed: needing a little assist with transfers, but able to ambulate with RW with min guard assist  ADL's / Homemaking Assistance Needed: needing a little assist with UB ADLs, mod assist with LB ADLs, and supervision for toileting  Comments: Pt reports he is retired from [employer] and now works part time for a Clinical biochemist Status:  Mobility: Bed Mobility Overal bed mobility: Needs Assistance Bed Mobility: Sidelying to Sit, Sit to Supine Rolling: Mod  assist Sidelying to sit: Mod assist Supine to sit: Min assist Sit to supine: Mod assist General bed mobility comments: Mod A for bed mobility tasks. Continues to require multimodal cues for sequencing when performing mobility tasks. Pt reporting dizziness upon sitting and BP at 118/73 mmHg.  Transfers Overall transfer level: Needs assistance Equipment used: Rolling walker (2 wheeled) Transfers: Sit to/from Stand Sit to Stand: Mod assist Stand pivot transfers: Min assist  Lateral/Scoot Transfers: Mod assist General transfer comment: Mod A for lift assist and steadying to stand. Upon standing, pt reporting increased dizziness. Checked BP, and BP at 108/75 mmHg. Pt requesting to sit back down and return to bed. Was able to stand for ~2-3 mins for BP to take. Required mod A to assist with lateral scoots X3-4 to scoot up towards HOB. Required mod A for assist with scooting and multimodal cues for sequencing. ' Ambulation/Gait Ambulation/Gait assistance: Min assist Gait Distance (Feet): 10 Feet(x2 (to/from bathroom)) Assistive device: Rolling walker (2 wheeled) Gait Pattern/deviations: Step-through pattern, Decreased dorsiflexion - right, Decreased dorsiflexion - left, Trunk flexed General Gait Details: pt with extremely short shuffled steps,  max directional v/c's, modA for walker management Gait velocity: decreased Gait velocity interpretation: <1.8 ft/sec, indicate of risk for recurrent falls Stairs assistance: Min assist Stair Management: One rail Right General stair comments: cues for sequencing (up with left foot, down with right foot). step by step sequencing    ADL: ADL Overall ADL's : Needs assistance/impaired Grooming: Minimal assistance, Sitting Upper Body Bathing: Sitting, Moderate assistance Lower Body Bathing: Sit to/from stand, Total assistance, +2 for safety/equipment Upper Body Dressing : Moderate assistance, Sitting Lower Body Dressing: Total assistance, +2 for  safety/equipment, Sit to/from stand Toilet Transfer: Minimal assistance, Moderate assistance, RW, Ambulation, +2 for physical assistance, +2 for safety/equipment Toileting- Clothing Manipulation and Hygiene: Maximal assistance, Sit to/from stand Functional mobility during ADLs: Moderate assistance, Rolling walker General ADL Comments: pt limited by cognition, pain and balance  Cognition: Cognition Overall Cognitive Status: Impaired/Different from baseline Orientation Level: Oriented X4 Attention: Sustained Focused Attention: Appears intact Sustained Attention: Impaired Sustained Attention Impairment: Functional basic, Verbal basic(pt easily distracted by discomfort from leg pain, decreased sustained attention to less than 1 minute during session) Memory: Impaired Memory Impairment: Storage deficit, Retrieval deficit, Decreased short term memory, Decreased recall of new information(pt did not recall information neurologist had shared with him within 3 minutes after leaving despite SLP verbal cues) Decreased Short Term Memory: Verbal basic(see above) Awareness: Impaired Awareness Impairment: Intellectual impairment Problem Solving: Impaired Problem Solving Impairment: Functional basic(to locate call bell and contact RN for pain medicine) Executive Function: (pt is not at this level at this time) Behaviors: Restless, Perseveration, Impulsive Safety/Judgment: Impaired Cognition Arousal/Alertness: Awake/alert Behavior During Therapy: Flat affect Overall Cognitive Status: Impaired/Different from baseline Area of Impairment: Attention, Memory, Following commands, Safety/judgement, Awareness, Problem solving Orientation Level: Disoriented to, Time Current Attention Level: Sustained Memory: Decreased recall of precautions, Decreased short-term memory Following Commands: Follows one step commands with increased time Safety/Judgement: Decreased awareness of deficits, Decreased awareness of  safety Awareness: Intellectual Problem Solving: Slow processing, Difficulty sequencing, Decreased initiation, Requires tactile cues, Requires verbal cues General Comments: Pt frequently self distracts, and requires cues to redirect to task. Required multimodal cues for sequencing throughout mobility.    Blood pressure (!) 122/59, pulse (!) 51, temperature 97.6 F (36.4 C), temperature source Oral, resp. rate 16, height  (1.753 m), weight 97.4 kg, SpO2 98 %. Physical Exam  Constitutional:  obese  Cardiovascular: Regular rhythm. Exam reveals no friction rub.  No murmur heard. Sl bradycardia  Respiratory: Effort normal. No respiratory distress. He has no wheezes.  GI: Soft. He exhibits no distension. There is no abdominal tenderness.  Musculoskeletal: Normal range of motion.        General: No deformity or edema.  Neurological: He is alert. No cranial nerve deficit.  Oriented to place, person, month, reason he was here. Provided biographical information. Fair insight and awareness. UE 4/5 prox to distal. LE: 4-/5 HF, 4/5 KE and 4/5 ADF/PF. No focal sensory findings. No limb ataxia seen.   Psychiatric:  Flat, fairly cooperative.     Results for orders placed or performed during the hospital encounter of 07/28/18 (from the past 48 hour(s))  Glucose, capillary     Status: Abnormal   Collection Time: 07/30/18 11:25 AM  Result Value Ref Range   Glucose-Capillary 154 (H) 70 - 99 mg/dL  Glucose, capillary     Status: Abnormal   Collection Time: 07/30/18  3:39 PM  Result Value Ref Range   Glucose-Capillary 152 (H) 70 - 99 mg/dL  Glucose, capillary  Status: Abnormal   Collection Time: 07/30/18  7:29 PM  Result Value Ref Range   Glucose-Capillary 104 (H) 70 - 99 mg/dL   Comment 1 Notify RN    Comment 2 Document in Chart   Glucose, capillary     Status: None   Collection Time: 07/30/18 11:36 PM  Result Value Ref Range   Glucose-Capillary 87 70 - 99 mg/dL   Comment 1 Notify RN     Comment 2 Document in Chart   Glucose, capillary     Status: Abnormal   Collection Time: 07/31/18  3:41 AM  Result Value Ref Range   Glucose-Capillary 110 (H) 70 - 99 mg/dL   Comment 1 Notify RN    Comment 2 Document in Chart   CBC     Status: None   Collection Time: 07/31/18  7:30 AM  Result Value Ref Range   WBC 5.4 4.0 - 10.5 K/uL   RBC 4.78 4.22 - 5.81 MIL/uL   Hemoglobin 14.0 13.0 - 17.0 g/dL   HCT 69.642.3 29.539.0 - 28.452.0 %   MCV 88.5 80.0 - 100.0 fL   MCH 29.3 26.0 - 34.0 pg   MCHC 33.1 30.0 - 36.0 g/dL   RDW 13.213.9 44.011.5 - 10.215.5 %   Platelets 239 150 - 400 K/uL   nRBC 0.0 0.0 - 0.2 %    Comment: Performed at [facility], [address] [place], [place] [address]  Basic metabolic panel     Status: Abnormal   Collection Time: 07/31/18  7:30 AM  Result Value Ref Range   Sodium 135 135 - 145 mmol/L   Potassium 4.1 3.5 - 5.1 mmol/L   Chloride 101 98 - 111 mmol/L   CO2 17 (L) 22 - 32 mmol/L   Glucose, Bld 131 (H) 70 - 99 mg/dL   BUN 16 8 - 23 mg/dL   Creatinine, Ser 6.441.03 0.61 - 1.24 mg/dL   Calcium 8.9 8.9 - 03.410.3 mg/dL   GFR calc non Af Amer >60 >60 mL/min   GFR calc Af Amer >60 >60 mL/min   Anion gap 17 (H) 5 - 15    Comment: Performed at [facility], [address] [place], [place] [address]  Glucose, capillary     Status: Abnormal   Collection Time: 07/31/18  7:35 AM  Result Value Ref Range   Glucose-Capillary 115 (H) 70 - 99 mg/dL  Glucose, capillary     Status: Abnormal   Collection Time: 07/31/18 11:57 AM  Result Value Ref Range   Glucose-Capillary 162 (H) 70 - 99 mg/dL  Glucose, capillary     Status: Abnormal   Collection Time: 07/31/18  4:32 PM  Result Value Ref Range   Glucose-Capillary 119 (H) 70 - 99 mg/dL   Comment 1 Notify RN    Comment 2 Document in Chart   Glucose, capillary     Status: Abnormal   Collection Time: 07/31/18  8:12 PM  Result Value Ref Range   Glucose-Capillary 147 (H) 70 - 99 mg/dL  Glucose, capillary     Status: Abnormal    Collection Time: 07/31/18 11:16 PM  Result Value Ref Range   Glucose-Capillary 129 (H) 70 - 99 mg/dL  CBC     Status: None   Collection Time: 08/01/18  4:23 AM  Result Value Ref Range   WBC 4.9 4.0 - 10.5 K/uL   RBC 4.63 4.22 - 5.81 MIL/uL   Hemoglobin 13.6 13.0 - 17.0 g/dL  HCT 39.9 39.0 - 52.0 %   MCV 86.2 80.0 - 100.0 fL   MCH 29.4 26.0 - 34.0 pg   MCHC 34.1 30.0 - 36.0 g/dL   RDW 16.1 09.6 - 04.5 %   Platelets 242 150 - 400 K/uL   nRBC 0.0 0.0 - 0.2 %    Comment: Performed at [facility], [address], [place], [place] [address]  Basic metabolic panel     Status: Abnormal   Collection Time: 08/01/18  4:23 AM  Result Value Ref Range   Sodium 138 135 - 145 mmol/L   Potassium 3.9 3.5 - 5.1 mmol/L   Chloride 103 98 - 111 mmol/L   CO2 19 (L) 22 - 32 mmol/L   Glucose, Bld 124 (H) 70 - 99 mg/dL   BUN 16 8 - 23 mg/dL   Creatinine, Ser 1.91 0.61 - 1.24 mg/dL   Calcium 8.9 8.9 - 47.8 mg/dL   GFR calc non Af Amer >60 >60 mL/min   GFR calc Af Amer >60 >60 mL/min   Anion gap 16 (H) 5 - 15    Comment: Performed at [facility], [address] [place], [place] [address]  Glucose, capillary     Status: Abnormal   Collection Time: 08/01/18  4:31 AM  Result Value Ref Range   Glucose-Capillary 167 (H) 70 - 99 mg/dL  Glucose, capillary     Status: Abnormal   Collection Time: 08/01/18  8:11 AM  Result Value Ref Range   Glucose-Capillary 123 (H) 70 - 99 mg/dL   Comment 1 Notify RN    Comment 2 Document in Chart    No results found.     Medical Problem List and Plan: 1.  Functional decline secondary to AMS, new onset seizures and recent TBI (left frontal SDH, right occipital SAH) with subsequent hydrocephalus.   -admit to inpatient rehab  -provided patient information regarding prognosis, his type of injury, expected course. 2.  Antithrombotics: -DVT/anticoagulation:  Mechanical: Sequential compression devices, below knee Bilateral lower extremities  -antiplatelet  therapy: N/A 3. Pain Management: Tylenol prn.  4. Mood: LCSW to follow for evaluation and support.   -antipsychotic agents: N/A 5. Neuropsych: This patient is not capable of making decisions on his own behalf. 6. Skin/Wound Care: routine pressure relief measures.  7. Fluids/Electrolytes/Nutrition: Monitor I/O. Check lytes in am. 8. T2DM: Monitor BS ac/hs. Continue to hold glucotrol for now and use SSI for elevated BS. Adjust regimen as needed.  9. CAD/PAF/A flutter: Off Plavix due to SDH. Monitor HR bid. Continue Cardizem CD and Toprol XL. Will order orthostatic vitals due to reports of dizziness. Order TEDs. Continue to hold Losartan. Pt with regular rhythm at present 10. Left > Right hydrocele: On Augmentin D# 4 11. AKI with hyponateremia: Has resolved with hydration. Recheck on Monday.  12. New onset seizures: Continue Keppra 500 mg bid.       Jacquelynn Cree, PA-C 08/01/2018

## 2018-08-01 NOTE — Progress Notes (Signed)
Inpatient Rehab Admissions Coordinator:   I have insurance authorization and approval from Dr. Blake Divine for pt to admit to inpatient rehab today.  I have let RN, CM, and pt/wife know.   Estill Dooms, PT, DPT Admissions Coordinator [phone] 08/01/18  11:10 AM

## 2018-08-01 NOTE — Discharge Summary (Signed)
Physician Discharge Summary  [name] HeritageDonald G Frank ZOX:[id number] DOB: 15-May-1950 DOA: 07/28/2018  PCP: Pearline Cablesopland, Jessica C, MD  Admit date: 07/28/2018 Discharge date: 08/01/2018  Admitted From: HOME.  Disposition:  Pending CIR.   Recommendations for Outpatient Follow-up:  1. Follow up with PCP in 1-2 weeks 2. Please obtain BMP/CBC in one week Please follow up with neurology as recommended.   Discharge Condition: stable.  CODE STATUS:FULL CODE.  Diet recommendation: Heart Healthy    Brief/Interim Summary: 68 year old with past medical history significant for hypertension, diabetes type 2, coronary artery disease, paroxysmal A. fib and recent admission with traumatic subarachnoid hemorrhage and subdural hematoma now presenting to the emergency department for evaluation of right facial droop and speech difficulty.  Patient was discharged from the hospital on 07/22/2018 after conservative management of traumatic subarachnoid hemorrhage, diagnosed with paroxysmal A. fib during that admission and restarted on aspirin.  He had been doing well after returning home, helped his son with his taxes and was increased.  On third 3 days prior to admission he became lethargic and had some transient confusion.  His mentation appears to fluctuate.  He was noted to be more lethargic the day of admission and developed acute onset of right facial droop, some slurred speech and then stopped speaking. Evaluation in the ED blood pressure was 90/53.  EKG with sinus rhythm rate 55.  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  Neurology was consulted in the ED and is recommending CTA head and neck and MRI.  Neurosurgery has been also consulted.  -Per neurosurgery and neurology, patient presentation is likely from  focal epileptiform activity secondary to hematoma breakdown products. Unless seizures become medically refractory, no surgical  intervention indicated.   Patient was a started on Keppra his mental status has improved.  He has been tolerating Keppra.  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 -Found to have developing communicating hydrocephalus on CT, likely secondary to recent subarachnoid and intraventricular bleeding. -MRI of the brain  redemonstrate 10 mm left-sided SDH appears to have some local mass-effect and sulcal flattening and a stable hydrocephalus. -EEG with left hemispheric slowing, most prominent in the left temporal region, consistent with the patient history of left SDH.  Also noted rare left temporal sharp waves suggestive of epileptogenic  Potential. -Dr. Wilford CornerArora started patient on Keppra for possible seizure.  -Per neurosurgery and neurology likely focal epileptiform activity secondary to hematoma breakdown products.  Unless seizures become medically refractory, no surgical intervention indicated.   -continue with Keppra.  Paroxysmal A. Fib Sinus rhythm on admission. Not on anticoagulation due [facility].  Continue with diltiazem and metoprolol. Continue with long acting cardizem, now that patient is able to swallow pills better.  Resume aspirin on discharge if okay with neurologist.   Hypertension:  Well controlled.  Resume losartan, cardizem and metoprolol.   Mild AKI/acidosis Improved.   Type 2 diabetes:  Resume home meds on discharge.    Large left side  hydrocele;  Mild redness.  He was started on Augmentin. Continue for now.  Needs to follow up with urology/ surgery on discharge in 1 to 2 weeks        Discharge Instructions   Allergies as of 08/01/2018   No Known Allergies     Medication List    STOP taking these medications   aspirin 81 MG chewable tablet   guaiFENesin 600 MG 12 hr tablet Commonly known as:  MUCINEX   methocarbamol 750 MG tablet Commonly known as:  ROBAXIN   traMADol 50 MG tablet Commonly known as:  ULTRAM     TAKE these medications   acetaminophen 325 MG tablet Commonly known as:  TYLENOL Take 2 tablets (650 mg total) by mouth every 6 (six) hours as needed for mild pain.   amoxicillin-clavulanate 875-125 MG tablet Commonly known as:  AUGMENTIN Take 1 tablet by mouth every 12 (twelve) hours.   atorvastatin 80 MG tablet Commonly known as:  LIPITOR Take 1 tablet (80 mg total) by mouth daily.   diltiazem 360 MG 24 hr capsule Commonly known as:  CARDIZEM CD Take 1 capsule (360 mg total) by mouth daily.   gabapentin 300 MG capsule Commonly known as:  NEURONTIN Take 1 capsule (300 mg total) by mouth 3 (three) times daily.   glipiZIDE 10 MG 24 hr tablet Commonly known as:  GLUCOTROL XL Take 1 tablet (10 mg total) by mouth 2 (two) times daily.   Invokamet [id number] MG Tabs Generic drug:  Canagliflozin-metFORMIN HCl Take 1 tablet by mouth 2 (two) times daily.   levETIRAcetam 500 MG tablet Commonly known as:  KEPPRA Take 1 tablet (500 mg total) by mouth 2 (two) times daily.   losartan 50 MG tablet Commonly known as:  COZAAR Take 1 tablet (50 mg total) by mouth daily.   metoprolol succinate 100 MG 24 hr tablet Commonly known as:  TOPROL-XL Take 1 tablet (100 mg total) by mouth daily. Take with or immediately following a meal.      Follow-up Information    Copland, Gwenlyn Found, MD. Schedule an appointment as soon as possible for a visit in 2 week(s).   Specialty:  Family Medicine Contact  information: [address] [place] [address] [phone]        Hedwig Morton., MD .   Specialty:  Cardiology Contact information: [address] [place] [address] [phone]          No Known Allergies  Consultations:  Neurology.    Procedures/Studies: Ct Angio Head W Or Wo Contrast  Result Date: 07/28/2018 CLINICAL DATA:  68 y/o M; left-sided facial droop. Progressive somnolence. EXAM: CT ANGIOGRAPHY HEAD AND NECK TECHNIQUE: Multidetector CT imaging of the head and neck was performed using the standard protocol during bolus administration of intravenous contrast. Multiplanar CT image reconstructions and MIPs were obtained to evaluate the vascular anatomy. Carotid stenosis measurements (when applicable) are obtained utilizing NASCET criteria, using the distal internal carotid diameter as the denominator. CONTRAST:  75mL OMNIPAQUE IOHEXOL 350 MG/ML SOLN COMPARISON:  07/28/2018 CT of the head. FINDINGS: CTA NECK FINDINGS Aortic arch: Standard branching. Imaged portion shows no evidence of aneurysm or dissection. No significant stenosis of the major arch vessel origins. Mild aortic calcific atherosclerosis. Right carotid system: No evidence of dissection, stenosis (50% or greater) or occlusion. Mild non stenotic calcific atherosclerosis of carotid bifurcation. Left carotid system: No evidence of dissection, stenosis (50% or greater) or occlusion. Mild non stenotic calcific atherosclerosis of  carotid bifurcation. Vertebral arteries: Left V1-V2 proximal V3 are occluded. The left distal V3 and V4 are diffusely irregular with attenuated enhancement and reversal of flow from the vertebrobasilar junction. There appears to be a cauda above the left C2 foramen transversarium (series 7, image 86). Widely patent and dominant right vertebral artery. Skeleton: No acute finding. Other neck: Negative. Upper chest: Right lung apex calcified granuloma. Review  of the MIP images confirms the above findings CTA HEAD FINDINGS Anterior circulation: No significant stenosis, proximal occlusion, aneurysm, or vascular malformation. Posterior circulation: Left V1-V2 and proximal V3 are occluded, no visible dissection flap or wall hematoma. The left distal V3 and V4 are diffusely irregular with attenuated enhancement and reversal of flow from the vertebrobasilar junction. Widely patent and dominant right vertebral artery. There appears to be a cauda above the left C2 foramen transversarium (series 7, image 86). Tandem segments of mild-to-moderate stenosis in the left P2 and P3 segments. Large PICA and SCA noted bilaterally. Otherwise no significant stenosis, proximal occlusion, aneurysm, or vascular malformation in the posterior circulation. Venous sinuses: As permitted by contrast timing, patent. Anatomic variants: Fetal left PCA. No right posterior communicating artery identified, likely hypoplastic or absent. Probable diminutive anterior communicating artery. Review of the MIP images confirms the above findings IMPRESSION: 1. Nonspecific left V1-V2 and proximal V3 occlusion. There appears to be a cutoff above the left C2 foramen transversarium suggesting a recent thromboembolic occlusion or dissection. 2. Left distal V3 and V4 are diffusely irregular with attenuated enhancement and reversal of flow from vertebrobasilar junction. 3. Patent carotid systems and dominant right vertebral artery without dissection, aneurysm, or hemodynamically significant stenosis utilizing NASCET criteria. 4. Mild calcific atherosclerosis of aortic arch and carotid bifurcations. CTA HEAD:. 1. Patent Circle of Willis. No intracranial large vessel occlusion, aneurysm, or vascular malformation identified. 2. Tandem segments of mild-to-moderate stenosis in the left P2 and P3 segments. 3. Left V4 is diffusely irregular and attenuated with reversal of flow from the vertebrobasilar junction. 4. Fetal left  PCA. These results were called by telephone at the time of interpretation on 07/28/2018 at 7:15 pm to Dr. Milon Dikes , who verbally acknowledged these results. Electronically Signed   By: Mitzi Hansen M.D.   On: 07/28/2018 19:18   Ct Head Wo Contrast  Result Date: 07/29/2018 CLINICAL DATA:  Right facial droop. EXAM: CT HEAD WITHOUT CONTRAST TECHNIQUE: Contiguous axial images were obtained from the base of the skull through the vertex without intravenous contrast. COMPARISON:  CT scan of Jul 28, 2018. FINDINGS: Brain: Grossly stable left frontal parietal subdural fluid collection is noted of mixed density. Biventricular diameter is measured at 52 mm which is not significantly changed compared to prior exam. This is concerning for developing communicating hydrocephalus. Stable amount of intraventricular hemorrhage is noted in the right posterior horn. Stable appearance of subarachnoid hemorrhage is seen in right occipital lobe. No new hemorrhage or infarction is noted. Mild chronic ischemic white matter disease is noted. Vascular: No hyperdense vessel or unexpected calcification. Skull: Normal. Negative for fracture or focal lesion. Sinuses/Orbits: No acute finding. Other: Staples are seen involving the right posterior parietal scalp. IMPRESSION: Grossly stable left frontal parietal subdural fluid collection is noted. Stable ventricular dilatation is noted concerning for communicating hydrocephalus. Stable amount of intraventricular hemorrhage is noted in the right posterior horn. Stable appearance of small amount of subarachnoid hemorrhage is seen in right occipital lobe. No new abnormality is noted compared to prior exam. Electronically Signed   By: Fayrene Fearing  Christen Butter M.D.   On: 07/29/2018 09:39   Ct Head Wo Contrast  Result Date: 07/17/2018 CLINICAL DATA:  Followup subdural hematoma EXAM: CT HEAD WITHOUT CONTRAST TECHNIQUE: Contiguous axial images were obtained from the base of the skull through  the vertex without intravenous contrast. COMPARISON:  07/15/2018 FINDINGS: Brain: Left convexity subdural hematoma is not significantly changed, 7-8 mm in thickness. Small amount of subarachnoid hemorrhage remains evident within the sulci. Small amount of layering intraventricular hemorrhage. Ventricular size remains stable. Chronic small-vessel changes of the white matter as seen previously. No new finding. Vascular: No abnormal vascular finding. Skull: No skull fracture. Sinuses/Orbits: Clear/normal Other: Right parietal scalp injury with staples. IMPRESSION: No additional bleeding since the study of 2 days ago. Stable left convexity subdural hematoma, 7-8 mm in thickness. No significant mass-effect. Small amount of subarachnoid hemorrhage in the sulci. Small amount of dependent intraventricular hemorrhage without change in ventricular size. Electronically Signed   By: Paulina Fusi M.D.   On: 07/17/2018 08:04   Ct Head Wo Contrast  Result Date: 07/15/2018 CLINICAL DATA:  68 y/o  M; subdural hematoma. EXAM: CT HEAD WITHOUT CONTRAST TECHNIQUE: Contiguous axial images were obtained from the base of the skull through the vertex without intravenous contrast. COMPARISON:  07/14/2018 CT head FINDINGS: Brain: Left-sided subdural hematoma is overall stable in volume, with decreased greatest thickness to 8 mm with and increased settling of blood products over the posterior convexity. Stable small volume of subarachnoid and intraventricular hemorrhage with redistribution to the occipital horns of lateral ventricles. No new intracranial hemorrhage, mass effect, stroke, or herniation. Stable ventricle size. Vascular: No hyperdense vessel or unexpected calcification. Skull: Right posterior scalp contusion and laceration with skin staples. No calvarial fracture Sinuses/Orbits: No acute finding. Other: Bilateral intra-ocular lens replacement IMPRESSION: 1. Stable volume left-sided subdural hematoma with decreased greatest  thickness to 8 mm and increased settling of blood products over the posterior convexity. No midline shift. 2. Stable small volume of subarachnoid and intraventricular hemorrhage with mild redistribution to occipital horns of lateral ventricles. Stable ventricle size. 3. No new acute intracranial abnormality. Electronically Signed   By: Mitzi Hansen M.D.   On: 07/15/2018 00:48   Ct Head Wo Contrast  Result Date: 07/14/2018 CLINICAL DATA:  Unwitnessed fall with back and neck pain. Posterior head laceration. EXAM: CT HEAD WITHOUT CONTRAST CT CERVICAL SPINE WITHOUT CONTRAST TECHNIQUE: Multidetector CT imaging of the head and cervical spine was performed following the standard protocol without intravenous contrast. Multiplanar CT image reconstructions of the cervical spine were also generated. COMPARISON:  None. FINDINGS: CT HEAD FINDINGS Brain: Crescentic subdural hematoma along the left cerebral convexity measuring up to 11 mm in thickness. There is associated cerebral mass effect. No measurable shift on axial slices. Patchy subarachnoid hemorrhage seen in the interpeduncular fossa, supra vermian cistern, left CP angle, and occipital/right parietal sulci. No infarct. Mild lateral ventriculomegaly attributed to volume loss. Vascular: Negative Skull: Right posterior scalp laceration and contusion without underlying calvarial fracture. Sinuses/Orbits: Bilateral cataract resection.  No acute finding Critical Value/emergent results were called by telephone at the time of interpretation on 07/14/2018 at 4:21 am to Dr. Marily Memos , who verbally acknowledged these results. CT CERVICAL SPINE FINDINGS Alignment: Normal Skull base and vertebrae: Negative for fracture. Dysmorphic upper cervical posterior elements, incidental. Soft tissues and spinal canal: No prevertebral fluid or swelling. No visible canal hematoma. A contusion is partially covered superficial to the right scapula, reference chest CT. Disc levels:   No evidence of  degenerative impingement Upper chest: Reported separately IMPRESSION: 1. Acute subdural hematoma along the left cerebral convexity measuring up to 11 mm in thickness. Mild cerebral mass effect. 2. Patchy subarachnoid hemorrhage with a traumatic pattern. 3. Negative for cervical spine fracture. Electronically Signed   By: Marnee Spring M.D.   On: 07/14/2018 04:24   Ct Angio Neck W Or Wo Contrast  Result Date: 07/28/2018 CLINICAL DATA:  68 y/o M; left-sided facial droop. Progressive somnolence. EXAM: CT ANGIOGRAPHY HEAD AND NECK TECHNIQUE: Multidetector CT imaging of the head and neck was performed using the standard protocol during bolus administration of intravenous contrast. Multiplanar CT image reconstructions and MIPs were obtained to evaluate the vascular anatomy. Carotid stenosis measurements (when applicable) are obtained utilizing NASCET criteria, using the distal internal carotid diameter as the denominator. CONTRAST:  75mL OMNIPAQUE IOHEXOL 350 MG/ML SOLN COMPARISON:  07/28/2018 CT of the head. FINDINGS: CTA NECK FINDINGS Aortic arch: Standard branching. Imaged portion shows no evidence of aneurysm or dissection. No significant stenosis of the major arch vessel origins. Mild aortic calcific atherosclerosis. Right carotid system: No evidence of dissection, stenosis (50% or greater) or occlusion. Mild non stenotic calcific atherosclerosis of carotid bifurcation. Left carotid system: No evidence of dissection, stenosis (50% or greater) or occlusion. Mild non stenotic calcific atherosclerosis of carotid bifurcation. Vertebral arteries: Left V1-V2 proximal V3 are occluded. The left distal V3 and V4 are diffusely irregular with attenuated enhancement and reversal of flow from the vertebrobasilar junction. There appears to be a cauda above the left C2 foramen transversarium (series 7, image 86). Widely patent and dominant right vertebral artery. Skeleton: No acute finding. Other neck:  Negative. Upper chest: Right lung apex calcified granuloma. Review of the MIP images confirms the above findings CTA HEAD FINDINGS Anterior circulation: No significant stenosis, proximal occlusion, aneurysm, or vascular malformation. Posterior circulation: Left V1-V2 and proximal V3 are occluded, no visible dissection flap or wall hematoma. The left distal V3 and V4 are diffusely irregular with attenuated enhancement and reversal of flow from the vertebrobasilar junction. Widely patent and dominant right vertebral artery. There appears to be a cauda above the left C2 foramen transversarium (series 7, image 86). Tandem segments of mild-to-moderate stenosis in the left P2 and P3 segments. Large PICA and SCA noted bilaterally. Otherwise no significant stenosis, proximal occlusion, aneurysm, or vascular malformation in the posterior circulation. Venous sinuses: As permitted by contrast timing, patent. Anatomic variants: Fetal left PCA. No right posterior communicating artery identified, likely hypoplastic or absent. Probable diminutive anterior communicating artery. Review of the MIP images confirms the above findings IMPRESSION: 1. Nonspecific left V1-V2 and proximal V3 occlusion. There appears to be a cutoff above the left C2 foramen transversarium suggesting a recent thromboembolic occlusion or dissection. 2. Left distal V3 and V4 are diffusely irregular with attenuated enhancement and reversal of flow from vertebrobasilar junction. 3. Patent carotid systems and dominant right vertebral artery without dissection, aneurysm, or hemodynamically significant stenosis utilizing NASCET criteria. 4. Mild calcific atherosclerosis of aortic arch and carotid bifurcations. CTA HEAD:. 1. Patent Circle of Willis. No intracranial large vessel occlusion, aneurysm, or vascular malformation identified. 2. Tandem segments of mild-to-moderate stenosis in the left P2 and P3 segments. 3. Left V4 is diffusely irregular and attenuated with  reversal of flow from the vertebrobasilar junction. 4. Fetal left PCA. These results were called by telephone at the time of interpretation on 07/28/2018 at 7:15 pm to Dr. Milon Dikes , who verbally acknowledged these results. Electronically Signed   By:  Mitzi Hansen M.D.   On: 07/28/2018 19:18   Ct Chest W Contrast  Result Date: 07/14/2018 CLINICAL DATA:  Unwitnessed fall with back pain EXAM: CT CHEST, ABDOMEN, AND PELVIS WITH CONTRAST TECHNIQUE: Multidetector CT imaging of the chest, abdomen and pelvis was performed following the standard protocol during bolus administration of intravenous contrast. CONTRAST:  OMNIPAQUE IOHEXOL 300 MG/ML  SOLN COMPARISON:  None. FINDINGS: CT CHEST FINDINGS Cardiovascular: Normal heart size. No pericardial effusion. Left and right coronary circulation atherosclerotic calcification. Aortic calcification. Mediastinum/Nodes: Negative for pneumomediastinum or hematoma. Lungs/Pleura: Mild subpleural hemorrhage in the posterior right chest associated with rib fractures. No hemothorax, lung contusion, or pneumothorax. There are scattered calcified pulmonary nodules consistent with remote granulomatous disease. Musculoskeletal: Posterior right sixth and seventh rib fractures. The seventh rib fracture is displaced. The cartilage of the right seventh rib is also fractured anteriorly. A subcutaneous contusion is seen over the posterior and right-sided chest wall. CT ABDOMEN PELVIS FINDINGS Hepatobiliary: No hepatic injury or perihepatic hematoma. Gallbladder is unremarkable Pancreas: Negative Spleen: Negative Adrenals/Urinary Tract: No adrenal hemorrhage or renal injury identified. Bladder is unremarkable. Stomach/Bowel: No evidence of injury. Colonic diverticulosis that is generalized Vascular/Lymphatic: Atherosclerotic calcification Reproductive: Large partially covered hydrocele on the left. Hydrocele is also seen on the right, extending into the inguinal canal. Other:  No ascites or pneumoperitoneum Musculoskeletal: Subcutaneous contusion involving the right more than left posterior flank. No associated fracture. IMPRESSION: 1. Posterior right sixth and seventh rib fractures with seventh rib fracture displacement. There is also seventh rib cartilage fracture anteriorly. 2. Soft tissue contusions of the back. 3. No evidence of intra-abdominal injury. Electronically Signed   By: Marnee Spring M.D.   On: 07/14/2018 04:36   Ct Cervical Spine Wo Contrast  Result Date: 07/14/2018 CLINICAL DATA:  Unwitnessed fall with back and neck pain. Posterior head laceration. EXAM: CT HEAD WITHOUT CONTRAST CT CERVICAL SPINE WITHOUT CONTRAST TECHNIQUE: Multidetector CT imaging of the head and cervical spine was performed following the standard protocol without intravenous contrast. Multiplanar CT image reconstructions of the cervical spine were also generated. COMPARISON:  None. FINDINGS: CT HEAD FINDINGS Brain: Crescentic subdural hematoma along the left cerebral convexity measuring up to 11 mm in thickness. There is associated cerebral mass effect. No measurable shift on axial slices. Patchy subarachnoid hemorrhage seen in the interpeduncular fossa, supra vermian cistern, left CP angle, and occipital/right parietal sulci. No infarct. Mild lateral ventriculomegaly attributed to volume loss. Vascular: Negative Skull: Right posterior scalp laceration and contusion without underlying calvarial fracture. Sinuses/Orbits: Bilateral cataract resection.  No acute finding Critical Value/emergent results were called by telephone at the time of interpretation on 07/14/2018 at 4:21 am to Dr. Marily Memos , who verbally acknowledged these results. CT CERVICAL SPINE FINDINGS Alignment: Normal Skull base and vertebrae: Negative for fracture. Dysmorphic upper cervical posterior elements, incidental. Soft tissues and spinal canal: No prevertebral fluid or swelling. No visible canal hematoma. A contusion is  partially covered superficial to the right scapula, reference chest CT. Disc levels:  No evidence of degenerative impingement Upper chest: Reported separately IMPRESSION: 1. Acute subdural hematoma along the left cerebral convexity measuring up to 11 mm in thickness. Mild cerebral mass effect. 2. Patchy subarachnoid hemorrhage with a traumatic pattern. 3. Negative for cervical spine fracture. Electronically Signed   By: Marnee Spring M.D.   On: 07/14/2018 04:24   Mr Brain Wo Contrast  Result Date: 07/29/2018 CLINICAL DATA:  Continued surveillance subdural. Paroxysmally episodes of aphasia. EXAM: MRI HEAD WITHOUT  CONTRAST TECHNIQUE: Multiplanar, multiecho pulse sequences of the brain and surrounding structures were obtained without intravenous contrast. COMPARISON:  CT head yesterday.  CTA head neck yesterday. FINDINGS: Brain: No acute infarction, parenchymal hemorrhage, or intra-axial mass lesion. 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 Extra-axial hematoma, LEFT subdural, heterogeneous signal intensity, with moderate mass effect on the LEFT hemisphere, measures 10 mm thickness on coronal series 11, image 11. Vascular: Flow voids in the carotid, basilar, and RIGHT vertebral arteries are preserved. Absent flow void/slow or reversed flow LEFT vertebral; please see CTA report for additional detail regarding flow reversal, and extracranial disease. Skull and upper cervical spine: No fracture. Sinuses/Orbits: Unremarkable. Other: None. 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 No evidence for posterior circulation or hemispheric infarct, despite significant disease in both vertebrals on CTA. 10 mm thick LEFT subdural hematoma, with mass effect on the LEFT hemisphere. No midline shift due to pre-existing atrophy, but flattening of the cortical sulci is observed. Electronically Signed   By: Elsie Stain M.D.   On:  07/29/2018 13:42   US Scrotum  Result Date: 07/29/2018 CLINICAL DATA:  Scrotal edema x1 day. EXAM: ULTRASOUND OF SCROTUM TECHNIQUE: Complete ultrasound examination of the testicles, epididymis, and other scrotal structures was performed. COMPARISON:  None. FINDINGS: Right testicle Measurements: 4.2 x 2.2 x 3.3 cm. No mass or microlithiasis visualized. Left testicle Measurements: 4.7 x 2.8 x 2.9 cm. No mass or microlithiasis visualized. Right epididymis:  Normal in size and appearance. Left epididymis:  Normal in size and appearance. Hydrocele: There is a very large left-sided hydrocele. There is a small to moderate sized right-sided hydrocele. Varicocele:  None visualized. IMPRESSION: 1. Large left-sided hydrocele. Small to moderate size right-sided hydrocele. 2. Otherwise, no additional significant sonographic abnormality detected. Electronically Signed   By: Katherine Mantle M.D.   On: 07/29/2018 17:02   Ct Abdomen Pelvis W Contrast  Result Date: 07/14/2018 CLINICAL DATA:  Unwitnessed fall with back pain EXAM: CT CHEST, ABDOMEN, AND PELVIS WITH CONTRAST TECHNIQUE: Multidetector CT imaging of the chest, abdomen and pelvis was performed following the standard protocol during bolus administration of intravenous contrast. CONTRAST:  OMNIPAQUE IOHEXOL 300 MG/ML  SOLN COMPARISON:  None. FINDINGS: CT CHEST FINDINGS Cardiovascular: Normal heart size. No pericardial effusion. Left and right coronary circulation atherosclerotic calcification. Aortic calcification. Mediastinum/Nodes: Negative for pneumomediastinum or hematoma. Lungs/Pleura: Mild subpleural hemorrhage in the posterior right chest associated with rib fractures. No hemothorax, lung contusion, or pneumothorax. There are scattered calcified pulmonary nodules consistent with remote granulomatous disease. Musculoskeletal: Posterior right sixth and seventh rib fractures. The seventh rib fracture is displaced. The cartilage of the right seventh rib is  also fractured anteriorly. A subcutaneous contusion is seen over the posterior and right-sided chest wall. CT ABDOMEN PELVIS FINDINGS Hepatobiliary: No hepatic injury or perihepatic hematoma. Gallbladder is unremarkable Pancreas: Negative Spleen: Negative Adrenals/Urinary Tract: No adrenal hemorrhage or renal injury identified. Bladder is unremarkable. Stomach/Bowel: No evidence of injury. Colonic diverticulosis that is generalized Vascular/Lymphatic: Atherosclerotic calcification Reproductive: Large partially covered hydrocele on the left. Hydrocele is also seen on the right, extending into the inguinal canal. Other: No ascites or pneumoperitoneum Musculoskeletal: Subcutaneous contusion involving the right more than left posterior flank. No associated fracture. IMPRESSION: 1. Posterior right sixth and seventh rib fractures with seventh rib fracture displacement. There is also seventh rib cartilage fracture anteriorly. 2. Soft tissue contusions of the back. 3. No evidence of intra-abdominal injury. Electronically Signed   By: Marnee Spring M.D.   On:  07/14/2018 04:36   Dg Pelvis Portable  Result Date: 07/14/2018 CLINICAL DATA:  Unwitnessed fall EXAM: PORTABLE PELVIS 1-2 VIEWS COMPARISON:  None. FINDINGS: There is no evidence of pelvic fracture or diastasis. No pelvic bone lesions are seen. IMPRESSION: Negative. Electronically Signed   By: Charlett Nose M.D.   On: 07/14/2018 02:14   Dg Chest Port 1 View  Result Date: 07/18/2018 CLINICAL DATA:  Shortness of breath.  Rib fracture. EXAM: PORTABLE CHEST 1 VIEW COMPARISON:  07/14/2018 FINDINGS: 1359 hours. Low volumes with asymmetric elevation right hemidiaphragm. The cardio pericardial silhouette is enlarged. There is pulmonary vascular congestion without overt pulmonary edema. No evidence for pneumothorax or substantial pleural effusion. There is some atelectasis at the bases. Posterior sixth and seventh rib fractures seen on recent CT not readily evident on  x-ray. IMPRESSION: Low lung volumes with basilar atelectasis. No evidence for pneumothorax. Electronically Signed   By: Kennith Center M.D.   On: 07/18/2018 14:28   Dg Chest Portable 1 View  Result Date: 07/14/2018 CLINICAL DATA:  Unwitnessed fall.  Back pain EXAM: PORTABLE CHEST 1 VIEW COMPARISON:  12/02/2006 FINDINGS: Low lung volumes, bibasilar atelectasis. Heart is borderline in size. No visible effusions or pneumothorax. Posterior right 7th rib fracture. IMPRESSION: Low lung volumes, bibasilar atelectasis. Posterior right 7th rib fracture.  No pneumothorax. Electronically Signed   By: Charlett Nose M.D.   On: 07/14/2018 02:15   Ct Head Code Stroke Wo Contrast  Result Date: 07/28/2018 CLINICAL DATA:  Code stroke. LEFT-sided facial droop. Progressive somnolence. Last seen normal 1745 hours. EXAM: CT HEAD WITHOUT CONTRAST TECHNIQUE: Contiguous axial images were obtained from the base of the skull through the vertex without intravenous contrast. COMPARISON:  Multiple priors, most recent 07/17/2018 FINDINGS: Brain: No acute stroke or parenchymal hemorrhage. No intracranial intra-axial mass lesion. Developing hydrocephalus, biventricular diameter now 52 mm, as compared with 46 mm on 05/07. LEFT frontoparietal subdural collection is also increased, now 8 mm as compared with 4-5 mm previous. The attenuation of the subdural collection is now becoming more isodense, with some slight hyperdense residual collection. Intraventricular blood is improving but still layering. Subarachnoid blood is resolving. Vascular: No hyperdense vessel or unexpected calcification. Skull: Normal. Negative for fracture or focal lesion. Sinuses/Orbits: Negative Other: None ASPECTS ([place]berta Stroke Program Early CT Score) - Ganglionic level infarction (caudate, lentiform nuclei, internal capsule, insula, M1-M3 cortex): 7 - Supraganglionic infarction (M4-M6 cortex): 3 Total score (0-10 with 10 being normal): 10 IMPRESSION: 1. 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 Biventricular diameter is 52 mm, which is significant. Resolving subarachnoid blood. 2. Increasing size of LEFT frontoparietal subdural collection, now 8 mm. 3. ASPECTS is 10. * These results were called by telephone at the time of interpretation on 07/28/2018 at 6:50 pm to Dr. Wilford Corner , who verbally acknowledged these results. Electronically Signed   By: Elsie Stain M.D.   On: 07/28/2018 18:56       Subjective: No new complaints.   Discharge Exam: Vitals:   08/01/18 0430 08/01/18 0749  BP: 127/66 (!) 122/59  Pulse: (!) 50 (!) 51  Resp: 16 16  Temp: 97.7 F (36.5 C) 97.6 F (36.4 C)  SpO2: 99% 98%   Vitals:   07/31/18 2317 08/01/18 0428 08/01/18 0430 08/01/18 0749  BP:   127/66 (!) 122/59  Pulse: (!) 52  (!) 50 (!) 51  Resp: 18  16 16   Temp: 97.9 F (36.6 C)  97.7 F (36.5 C) 97.6 F (36.4  C)  TempSrc: Oral  Oral Oral  SpO2:   99% 98%  Weight:  97.4 kg    Height:        General: Pt is alert, awake, not in acute distress Cardiovascular: RRR, S1/S2 +, no rubs, no gallops Respiratory: CTA bilaterally, no wheezing, no rhonchi Abdominal: Soft, NT, ND, bowel sounds + Extremities: no edema, no cyanosis    The results of significant diagnostics from this hospitalization (including imaging, microbiology, ancillary and laboratory) are listed below for reference.     Microbiology: Recent Results (from the past 240 hour(s))  SARS Coronavirus 2 (CEPHEID - Performed in [facility] hospital lab), Hosp Order     Status: None   Collection Time: 07/28/18  8:23 PM  Result Value Ref Range Status   SARS Coronavirus 2 NEGATIVE NEGATIVE Final    Comment: (NOTE) If result is NEGATIVE SARS-CoV-2 target nucleic acids are NOT DETECTED. The SARS-CoV-2 RNA is generally detectable in upper and lower  respiratory specimens during the acute phase of infection. The lowest  concentration of SARS-CoV-2  viral copies this assay can detect is 250  copies / mL. A negative result does not preclude SARS-CoV-2 infection  and should not be used as the sole basis for treatment or other  patient management decisions.  A negative result may occur with  improper specimen collection / handling, submission of specimen other  than nasopharyngeal swab, presence of viral mutation(s) within the  areas targeted by this assay, and inadequate number of viral copies  (<250 copies / mL). A negative result must be combined with clinical  observations, patient history, and epidemiological information. If result is POSITIVE SARS-CoV-2 target nucleic acids are DETECTED. The SARS-CoV-2 RNA is generally detectable in upper and lower  respiratory specimens dur ing the acute phase of infection.  Positive  results are indicative of active infection with SARS-CoV-2.  Clinical  correlation with patient history and other diagnostic information is  necessary to determine patient infection status.  Positive results do  not rule out bacterial infection or co-infection with other viruses. If result is PRESUMPTIVE POSTIVE SARS-CoV-2 nucleic acids MAY BE PRESENT.   A presumptive positive result was obtained on the submitted specimen  and confirmed on repeat testing.  While 2019 novel coronavirus  (SARS-CoV-2) nucleic acids may be present in the submitted sample  additional confirmatory testing may be necessary for epidemiological  and / or clinical management purposes  to differentiate between  SARS-CoV-2 and other Sarbecovirus currently known to infect humans.  If clinically indicated additional testing with an alternate test  methodology [phone]) is advised. The SARS-CoV-2 RNA is generally  detectable in upper and lower respiratory sp ecimens during the acute  phase of infection. The expected result is Negative. Fact Sheet for Patients:  BoilerBrush.com.cy Fact Sheet for Healthcare  Providers: https://pope.com/ This test is not yet approved or cleared by the [place] FDA and has been authorized for detection and/or diagnosis of SARS-CoV-2 by FDA under an Emergency Use Authorization (EUA).  This EUA will remain in effect (meaning this test can be used) for the duration of the COVID-19 declaration under Section 564(b)(1) of the Act, 21 U.S.C. section 360bbb-3(b)(1), unless the authorization is terminated or revoked sooner. Performed at [facility], [address], [place], [place] [address]      Labs: BNP (last 3 results) No results for input(s): BNP in the last 8760 hours. Basic Metabolic Panel: Recent Labs  Lab 07/28/18 1839 07/28/18 1842 07/28/18 2052 07/29/18  1610 07/30/18 0416 07/31/18 0730 08/01/18 0423  NA 133* 131* 132* 135 137 135 138  K 4.6 4.5 4.6 4.4 4.2 4.1 3.9  CL 100 103  --  102 103 101 103  CO2 13*  --   --  18* 18* 17* 19*  GLUCOSE 184* 181*  --  70 91 131* 124*  BUN 21 20  --  20 17 16 16   CREATININE 1.25* 0.70  --  1.03 0.89 1.03 0.89  CALCIUM 9.1  --   --  8.9 8.7* 8.9 8.9   Liver Function Tests: Recent Labs  Lab 07/28/18 1839  AST 16  ALT 16  ALKPHOS 103  BILITOT 1.8*  PROT 6.9  ALBUMIN 3.5   No results for input(s): LIPASE, AMYLASE in the last 168 hours. No results for input(s): AMMONIA in the last 168 hours. CBC: Recent Labs  Lab 07/28/18 1839  07/28/18 2052 07/29/18 0420 07/30/18 0416 07/31/18 0730 08/01/18 0423  WBC 13.0*  --   --  7.2 6.2 5.4 4.9  NEUTROABS 10.6*  --   --  5.2  --   --   --   HGB 13.7   < > 12.2* 13.3 13.2 14.0 13.6  HCT 42.5   < > 36.0* 40.5 40.8 42.3 39.9  MCV 91.0  --   --  89.2 89.9 88.5 86.2  PLT 313  --   --  269 222 239 242   < > = values in this interval not displayed.   Cardiac Enzymes: No results for input(s): CKTOTAL, CKMB, CKMBINDEX, TROPONINI in the last 168 hours. BNP: Invalid input(s): POCBNP CBG: Recent Labs  Lab 07/31/18 1632  07/31/18 2012 07/31/18 2316 08/01/18 0431 08/01/18 0811  GLUCAP 119* 147* 129* 167* 123*   D-Dimer No results for input(s): DDIMER in the last 72 hours. Hgb A1c No results for input(s): HGBA1C in the last 72 hours. Lipid Profile No results for input(s): CHOL, HDL, LDLCALC, TRIG, CHOLHDL, LDLDIRECT in the last 72 hours. Thyroid function studies No results for input(s): TSH, T4TOTAL, T3FREE, THYROIDAB in the last 72 hours.  Invalid input(s): FREET3 Anemia work up No results for input(s): VITAMINB12, FOLATE, FERRITIN, TIBC, IRON, RETICCTPCT in the last 72 hours. Urinalysis    Component Value Date/Time   COLORURINE YELLOW 07/29/2018 0920   APPEARANCEUR CLEAR 07/29/2018 0920   LABSPEC 1.043 (H) 07/29/2018 0920   PHURINE 5.0 07/29/2018 0920   GLUCOSEU >=500 (A) 07/29/2018 0920   HGBUR NEGATIVE 07/29/2018 0920   BILIRUBINUR NEGATIVE 07/29/2018 0920   KETONESUR 80 (A) 07/29/2018 0920   PROTEINUR 30 (A) 07/29/2018 0920   UROBILINOGEN 0.2 12/02/2006 1058   NITRITE NEGATIVE 07/29/2018 0920   LEUKOCYTESUR NEGATIVE 07/29/2018 0920   Sepsis Labs Invalid input(s): PROCALCITONIN,  WBC,  LACTICIDVEN Microbiology Recent Results (from the past 240 hour(s))  SARS Coronavirus 2 (CEPHEID - Performed in [facility] hospital lab), Hosp Order     Status: None   Collection Time: 07/28/18  8:23 PM  Result Value Ref Range Status   SARS Coronavirus 2 NEGATIVE NEGATIVE Final    Comment: (NOTE) If result is NEGATIVE SARS-CoV-2 target nucleic acids are NOT DETECTED. The SARS-CoV-2 RNA is generally detectable in upper and lower  respiratory specimens during the acute phase of infection. The lowest  concentration of SARS-CoV-2 viral copies this assay can detect is 250  copies / mL. A negative result does not preclude SARS-CoV-2 infection  and should not be used as the sole basis for treatment or  other  patient management decisions.  A negative result may occur with  improper specimen collection /  handling, submission of specimen other  than nasopharyngeal swab, presence of viral mutation(s) within the  areas targeted by this assay, and inadequate number of viral copies  (<250 copies / mL). A negative result must be combined with clinical  observations, patient history, and epidemiological information. If result is POSITIVE SARS-CoV-2 target nucleic acids are DETECTED. The SARS-CoV-2 RNA is generally detectable in upper and lower  respiratory specimens dur ing the acute phase of infection.  Positive  results are indicative of active infection with SARS-CoV-2.  Clinical  correlation with patient history and other diagnostic information is  necessary to determine patient infection status.  Positive results do  not rule out bacterial infection or co-infection with other viruses. If result is PRESUMPTIVE POSTIVE SARS-CoV-2 nucleic acids MAY BE PRESENT.   A presumptive positive result was obtained on the submitted specimen  and confirmed on repeat testing.  While 2019 novel coronavirus  (SARS-CoV-2) nucleic acids may be present in the submitted sample  additional confirmatory testing may be necessary for epidemiological  and / or clinical management purposes  to differentiate between  SARS-CoV-2 and other Sarbecovirus currently known to infect humans.  If clinically indicated additional testing with an alternate test  methodology [phone]) is advised. The SARS-CoV-2 RNA is generally  detectable in upper and lower respiratory sp ecimens during the acute  phase of infection. The expected result is Negative. Fact Sheet for Patients:  BoilerBrush.com.cy Fact Sheet for Healthcare Providers: https://pope.com/ This test is not yet approved or cleared by the [place] FDA and has been authorized for detection and/or diagnosis of SARS-CoV-2 by FDA under an Emergency Use Authorization (EUA).  This EUA will remain in effect (meaning this  test can be used) for the duration of the COVID-19 declaration under Section 564(b)(1) of the Act, 21 U.S.C. section 360bbb-3(b)(1), unless the authorization is terminated or revoked sooner. Performed at [facility], [address] [place], [place] [address]      Time coordinating discharge: 34 minutes  SIGNED:   Kathlen Mody, MD  [facility] 08/01/2018, 10:08 AM Pager   If 7PM-7AM, please contact night-coverage [email or web address] Password [email or web address]

## 2018-08-01 NOTE — Progress Notes (Signed)
Physical Therapy Treatment Patient Details Name: [name] Frank MRN: [id number] DOB: 09-Oct-1950 Today's Date: 08/01/2018    History of Present Illness Pt is a 68 year-old male who presented with AMS.  Found to have hydrocephalus and concern for seizures. Noted recent admission with laceration on back of his scalp. 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 Continued workup. Pt is s/p EEG which showed left hemispheric slowing with rare left temporal sharp waves.     PT Comments    Pt making steady progress with functional mobility. He tolerated short distance gait training and functional transfer training, progressing from needing mod A initially to close min guard. Pt would continue to benefit from skilled physical therapy services at this time while admitted and after d/c to address the below listed limitations in order to improve overall safety and independence with functional mobility.    Follow Up Recommendations  CIR     Equipment Recommendations  Rolling walker with 5" wheels;3in1 (PT)    Recommendations for Other Services       Precautions / Restrictions Precautions Precautions: Fall Restrictions Weight Bearing Restrictions: No    Mobility  Bed Mobility               General bed mobility comments: pt OOB in recliner chair upon arrival  Transfers Overall transfer level: Needs assistance Equipment used: Rolling walker (2 wheeled) Transfers: Sit to/from Stand Sit to Stand: Mod assist;Min assist;Min guard         General transfer comment: pt progressing from initially needing mod A to power into standing from recliner, progressing to min A and then min guard with practice  Ambulation/Gait Ambulation/Gait assistance: Min assist;Mod assist Gait Distance (Feet): 10 Feet(10' forwards and 10' backwards) Assistive device: Rolling walker (2 wheeled) Gait Pattern/deviations: Step-to pattern;Step-through  pattern;Decreased step length - right;Decreased step length - left;Decreased stride length;Shuffle Gait velocity: decreased   General Gait Details: pt with extremely short shuffled steps, max directional v/c's, min-mod A for walker management   Stairs             Wheelchair Mobility    Modified Rankin (Stroke Patients Only) Modified Rankin (Stroke Patients Only) Pre-Morbid Rankin Score: No symptoms Modified Rankin: Moderately severe disability     Balance Overall balance assessment: Needs assistance;History of Falls Sitting-balance support: Feet supported Sitting balance-Leahy Scale: Fair     Standing balance support: Bilateral upper extremity supported;During functional activity Standing balance-Leahy Scale: Poor Standing balance comment: reliant on B UE and external support                            Cognition Arousal/Alertness: Awake/alert Behavior During Therapy: Flat affect Overall Cognitive Status: Impaired/Different from baseline Area of Impairment: Attention;Memory;Following commands;Safety/judgement;Awareness;Problem solving                   Current Attention Level: Sustained Memory: Decreased recall of precautions;Decreased short-term memory Following Commands: Follows one step commands with increased time Safety/Judgement: Decreased awareness of deficits;Decreased awareness of safety Awareness: Intellectual Problem Solving: Slow processing;Difficulty sequencing;Decreased initiation;Requires tactile cues;Requires verbal cues        Exercises General Exercises - Lower Extremity Ankle Circles/Pumps: AROM;Both;20 reps;Seated Long Arc Quad: AROM;Strengthening;Both;10 reps;Seated Mini-Sqauts: AROM;Strengthening;10 reps;Standing Other Exercises Other Exercises: 5x sit<>stand from recliner chair, progressing from mod A to close min guard    General Comments        Pertinent Vitals/Pain Pain Assessment: No/denies  pain    Home  Living                      Prior Function            PT Goals (current goals can now be found in the care plan section) Acute Rehab PT Goals PT Goal Formulation: With patient Time For Goal Achievement: 08/12/18 Potential to Achieve Goals: Good Progress towards PT goals: Progressing toward goals    Frequency    Min 4X/week      PT Plan Current plan remains appropriate    Co-evaluation              AM-PAC PT "6 Clicks" Mobility   Outcome Measure  Help needed turning from your back to your side while in a flat bed without using bedrails?: A Lot Help needed moving from lying on your back to sitting on the side of a flat bed without using bedrails?: A Lot Help needed moving to and from a bed to a chair (including a wheelchair)?: A Little Help needed standing up from a chair using your arms (e.g., wheelchair or bedside chair)?: A Lot Help needed to walk in hospital room?: A Lot Help needed climbing 3-5 steps with a railing? : A Lot 6 Click Score: 13    End of Session Equipment Utilized During Treatment: Gait belt Activity Tolerance: Patient tolerated treatment well Patient left: in chair;with call bell/phone within reach;with chair alarm set Nurse Communication: Mobility status PT Visit Diagnosis: Unsteadiness on feet (R26.81);Difficulty in walking, not elsewhere classified (R26.2);Other symptoms and signs involving the nervous system (Z61.096(R29.898)     Time: [id number] PT Time Calculation (min) (ACUTE ONLY): 32 min  Charges:  $Gait Training: 8-22 mins $Therapeutic Activity: 8-22 mins                     Deborah ChalkJennifer Earlie Arciga, [place]PT, DPT  Acute Rehabilitation Services Pager [phone] Office [phone]     Alessandra BevelsJennifer M Kalyb Pemble 08/01/2018, 12:43 PM

## 2018-08-01 NOTE — Care Management Important Message (Signed)
Important Message  Patient Details  Name: [name] Frank MRN: [id number] Date of Birth: 09/18/1950   Medicare Important Message Given:  Yes    Dorena Bodo 08/01/2018, 2:02 PM

## 2018-08-02 ENCOUNTER — Inpatient Hospital Stay (HOSPITAL_COMMUNITY): Payer: Medicare HMO | Admitting: Physical Therapy

## 2018-08-02 ENCOUNTER — Inpatient Hospital Stay (HOSPITAL_COMMUNITY): Payer: Medicare HMO | Admitting: Occupational Therapy

## 2018-08-02 ENCOUNTER — Inpatient Hospital Stay (HOSPITAL_COMMUNITY): Payer: Medicare HMO | Admitting: Speech Pathology

## 2018-08-02 DIAGNOSIS — R4701 Aphasia: Secondary | ICD-10-CM

## 2018-08-02 DIAGNOSIS — S069X9A Unspecified intracranial injury with loss of consciousness of unspecified duration, initial encounter: Secondary | ICD-10-CM

## 2018-08-02 DIAGNOSIS — S065X9A Traumatic subdural hemorrhage with loss of consciousness of unspecified duration, initial encounter: Secondary | ICD-10-CM

## 2018-08-02 LAB — GLUCOSE, CAPILLARY
Glucose-Capillary: 127 mg/dL — ABNORMAL HIGH (ref 70–99)
Glucose-Capillary: 185 mg/dL — ABNORMAL HIGH (ref 70–99)
Glucose-Capillary: 156 mg/dL — ABNORMAL HIGH (ref 70–99)
Glucose-Capillary: 153 mg/dL — ABNORMAL HIGH (ref 70–99)

## 2018-08-02 NOTE — Progress Notes (Signed)
Inpatient Rehabilitation  Patient information reviewed and entered into eRehab system by Shah Insley M. Evvie Behrmann, M.A., CCC/SLP, PPS Coordinator.  Information including medical coding, functional ability and quality indicators will be reviewed and updated through discharge.    

## 2018-08-02 NOTE — Evaluation (Signed)
Occupational Therapy Assessment and Plan  Patient Details  Name: [name] Frank MRN: [id number] Date of Birth: 06/15/1950  OT Diagnosis: cognitive deficits and muscle weakness (generalized) Rehab Potential: Rehab Potential (ACUTE ONLY): Good ELOS: ~14-16 days   Today's Date: 08/02/2018 OT Individual Time:  -        Problem List:  Patient Active Problem List   Diagnosis Date Noted  . SDH (subdural hematoma) ([place]) 08/01/2018  . Hydrocephalus in adult [facility]) 07/29/2018  . Hydrocephalus ([place]) 07/28/2018  . Mild renal insufficiency 07/28/2018  . Paroxysmal atrial fibrillation ([place]) 07/28/2018  . Subdural hematoma ([place]) 07/14/2018  . Hypertension, essential 01/16/2018  . Controlled type 2 diabetes mellitus without complication, without long-term current use of insulin ([place]) 01/16/2018  . Coronary artery disease involving native heart without angina pectoris 01/16/2018  . Diabetes mellitus type II, non insulin dependent ([place]) 01/13/2011    Past Medical History:  Past Medical History:  Diagnosis Date  . Diabetes mellitus   . Heart disease   . Hyperlipemia   . Hypertension    Past Surgical History:  Past Surgical History:  Procedure Laterality Date  . catarracts    . OTHER SURGICAL HISTORY  2016   HEART STENT  . TONSILLECTOMY      Assessment & Plan Clinical Impression: Patient is a 68 y.o. year old male history of T2DM, HTN, CA, fall down some stairs 07/14/18 with subsequent TBI/SDH and rib fractures.Hospital course significant for A. fib with RVR and he was started on low-dose ASA prior to discharge on 07/22/2018. He was readmitted on 07/28/2018 with 3-day history of progressive lethargy with acute onset of right facial droop and slurred speech. He was noted to be hypotensive with acute renal failureand has had waxing and waning of speech difficulty. Neurology recommended full work-up including ruling out infection, andEEG to rule out seizures. CT of head done grossly stable  left frontal parietal SDH,stable right occipital SAH with concerns of developing communicating hydrocephalus. 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   Dr. Venetia Constable did not feel that surgery was indicated at this time. NS felt that waxing and waning of symptoms felt to be due to seizures and no clinical signs of hydrocephalus. EEGrevealed left hemispheric slowing with rare sharp left temporal waves suggestive of epileptogenic potential.He was loaded with Keppra and started started on 500 mg p.o. twice daily. Scrotal edema with erythema noted on 5/19 and ultrasound revealed large left sided hydrocele and small to moderate right hydrocele therefore Augmentin added for treatment. He continues to have issues with dizziness, significant cognitive linguistic deficits with perseveration and confusion, weakness affecting mobility as well as ability to carry out ADLs Patient transferred to CIR on 08/01/2018 .    Patient currently requires mod with basic self-care skills and functional mobility  secondary to muscle weakness and continue to assess vision, decreased cardiorespiratoy endurance, decreased initiation, decreased attention, decreased awareness, decreased problem solving, decreased safety awareness, decreased memory and delayed processing and decreased standing balance, decreased postural control, decreased balance strategies and difficulty maintaining precautions.  Prior to hospitalization, patient could complete ADL with independent prior to original fall  Patient will benefit from skilled intervention to decrease level of assist with basic self-care skills and increase independence with basic self-care skills prior to  discharge home with  care partner.  Anticipate patient will require 24 hour supervision and follow up outpatient.  OT - End of Session Activity Tolerance: Tolerates 10 - 20 min activity with multiple rests Endurance Deficit: Yes Endurance Deficit Description: requires lots of rest break - pt with lots of fatigue OT Assessment Rehab Potential (ACUTE ONLY): Good OT Patient demonstrates impairments in the following area(s): Balance;Safety;Cognition;Skin Integrity;Edema;Endurance;Motor;Vision OT Basic ADL's Functional Problem(s): Grooming;Bathing;Dressing;Toileting OT Transfers Functional Problem(s): Toilet;Tub/Shower OT Additional Impairment(s): None OT Plan OT Intensity: Minimum of 1-2 x/day, 45 to 90 minutes OT Frequency: 5 out of 7 days OT Duration/Estimated Length of Stay: ~14-16 days OT Treatment/Interventions: Balance/vestibular training;Discharge planning;Pain management;Therapeutic Activities;Self Care/advanced ADL retraining;UE/LE Coordination activities;Cognitive remediation/compensation;Disease mangement/prevention;Functional mobility training;Patient/family education;Skin care/wound managment;Therapeutic Exercise;Community reintegration;Visual/perceptual remediation/compensation;DME/adaptive equipment instruction;Neuromuscular re-education;Psychosocial support;UE/LE Strength taining/ROM OT Self Feeding Anticipated Outcome(s): n/a OT Basic Self-Care Anticipated Outcome(s): supervision OT Toileting Anticipated Outcome(s): supervisoin  OT Bathroom Transfers Anticipated Outcome(s): supervision  OT Recommendation Recommendations for Other Services: Neuropsych consult Patient destination: Home Follow Up Recommendations: Outpatient OT Equipment Recommended: To be determined   Skilled Therapeutic Intervention Ot eval initiated with OT goals, purpose and role discussed with pt. Self care retraining at shower level. Pt demonstrates increased fatigue with all tasks but willing to perform.  Pt requires more than reasonable amt of time to perform all tasks. Pt did not demonstrate intellectual awareness of what is different now from the fall. Pt ambulated to the bathroom and to the toilet to attempt to void (unable) with min A with RW. Transitioned with grab bar into the shower. Pt required direct cues to wash all parts he could. Pt ambulated out of the bathroom with HHA  Still with min  A to w/c. Pt threaded shirt through one arm and the other through the head hole- requiring A to correct mistake. Pt does wear suspenders with his shorts - requiring A to don and fasten. A pt with shaving at the sink. Pt able to apply the cream while therapist shaved. Pt with difficulty orienting mouth to assist with shaving task- motor planning? Breakfast was setup for pt and pt able to self feed with encouragement. Left pt with nursing.   OT Evaluation Precautions/Restrictions  Precautions Precautions: Fall Restrictions Weight Bearing Restrictions: No General Chart Reviewed: Yes Family/Caregiver Present: No Vital Signs Therapy Vitals Pulse Rate: 70 BP: 130/75 Pain Pain Assessment Pain Scale: 0-10 Pain Score: 0-No pain Faces Pain Scale: No hurt Home Living/Prior Functioning Home Living Family/patient expects to be discharged to:: Private residence Living Arrangements: Spouse/significant other Available Help at Discharge: Family, Available 24 hours/day Type of Home: House Home Access: Stairs to enter Technical brewer of Steps: 5 Entrance Stairs-Rails: Can reach both Home Layout: Multi-level, Able to live on main level with bedroom/bathroom Bathroom Shower/Tub: Multimedia programmer: Handicapped height  Lives With: Spouse, Family IADL History Education: MBA Prior Function Level of Independence: Needs assistance with ADLs, Needs assistance with homemaking, Needs assistance with gait, Needs assistance with tranfers(wife providing min assist w/ all ADLs/mobility after first  hospital admission, otherwise pt was independent prior to initial fall )  Able to Take Stairs?: Yes(wife assisted him with steps to access house, independent prior to fall) Driving: Yes(drove prior to initial fall) Vocation: Retired Comments: Pt reports he is retired from  [employer] and now works part time for a [employer] fund[employer] company  ADL ADL Eating: Set up Where Assessed-Eating: Chair Grooming: Moderate assistance Where Assessed-Grooming: Sitting at sink Upper Body Bathing: Minimal assistance Where Assessed-Upper Body Bathing: Shower  Lower Body Bathing: Moderate assistance Where Assessed-Lower Body Bathing: Shower Upper Body Dressing: Moderate assistance Where Assessed-Upper Body Dressing: Sitting at sink Lower Body Dressing: Maximal assistance Where Assessed-Lower Body Dressing: Sitting at sink Toileting: Maximal assistance Toilet Transfer: Minimal assistance Toilet Transfer Method: Ambulating Toilet Transfer Equipment: Energy manager: Minimal assistance Social research officer, government Method: Heritage manager: Civil engineer, contracting with back, Grab bars Vision Baseline Vision/History: Wears glasses Wears Glasses: Reading only Patient Visual Report: Blurring of vision Vision Assessment?: Yes Eye Alignment: Within Functional Limits Ocular Range of Motion: Within Functional Limits Alignment/Gaze Preference: Within Defined Limits Tracking/Visual Pursuits: Able to track stimulus in all quads without difficulty Saccades: Overshoots;Decreased speed of saccadic movement Convergence: Impaired - to be further tested in functional context Visual Fields: No apparent deficits Additional Comments: will continue to assess Perception  Perception: Impaired Spatial Orientation: decreased Praxis Praxis: Impaired Praxis Impairment Details: Motor planning Praxis-Other Comments: noticed orally with trying to shave him Cognition Overall Cognitive Status:  Impaired/Different from baseline Arousal/Alertness: Awake/alert Orientation Level: Person;Place;Situation Person: Oriented Place: Oriented Situation: Oriented Year: 2020 Month: May Day of Week: Correct Memory: Impaired Memory Impairment: Storage deficit;Retrieval deficit;Decreased short term memory;Decreased recall of new information Decreased Short Term Memory: Verbal basic;Functional basic Immediate Memory Recall: Sock;Blue;Bed Memory Recall: Sock;Blue(2/3) Memory Recall Sock: Without Cue Memory Recall Blue: Without Cue Attention: Sustained;Selective Focused Attention: Appears intact Sustained Attention: Impaired Selective Attention: Impaired Selective Attention Impairment: Verbal basic;Functional basic Awareness: Impaired Awareness Impairment: Intellectual impairment Problem Solving: Impaired Problem Solving Impairment: Functional basic Executive Function: (all impacted by lower level deficits) Reasoning: Impaired Reasoning Impairment: Functional basic Sequencing: Impaired Sequencing Impairment: Verbal basic;Functional basic Behaviors: Lability Safety/Judgment: Impaired [employer] Scales of Cognitive Functioning: Confused/appropriate Sensation Sensation Light Touch: Appears Intact Proprioception: Appears Intact Stereognosis: Appears Intact Coordination Gross Motor Movements are Fluid and Coordinated: No Fine Motor Movements are Fluid and Coordinated: Yes Heel Shin Test: decreased speed bilaterally Motor  Motor Motor: Abnormal postural alignment and control Motor - Skilled Clinical Observations: generalized weakness Mobility  Bed Mobility Bed Mobility: Rolling Right;Rolling Left;Supine to Sit;Sit to Supine Rolling Right: Supervision/verbal cueing Rolling Left: Supervision/Verbal cueing Supine to Sit: Moderate Assistance - Patient 50-74% Sit to Supine: Minimal Assistance - Patient > 75% Transfers Sit to Stand: Moderate Assistance - Patient 50-74% Stand  to Sit: Moderate Assistance - Patient 50-74%  Trunk/Postural Assessment  Cervical Assessment Cervical Assessment: Exceptions to [facility] Thoracic Assessment Thoracic Assessment: Within Functional Limits Lumbar Assessment Lumbar Assessment: Exceptions to [facility] Postural Control Postural Control: Deficits on evaluation Protective Responses: delayed  Balance Balance Balance Assessed: Yes Static Sitting Balance Static Sitting - Balance Support: No upper extremity supported;Feet supported Static Sitting - Level of Assistance: 5: Stand by assistance Dynamic Sitting Balance Dynamic Sitting - Balance Support: No upper extremity supported;Feet supported Dynamic Sitting - Level of Assistance: 4: Min assist Static Standing Balance Static Standing - Balance Support: During functional activity;No upper extremity supported Static Standing - Level of Assistance: 4: Min assist Dynamic Standing Balance Dynamic Standing - Balance Support: No upper extremity supported;During functional activity Dynamic Standing - Level of Assistance: 3: Mod assist Extremity/Trunk Assessment RUE Assessment RUE Assessment: Within Functional Limits LUE Assessment LUE Assessment: Within Functional Limits     Refer to Care Plan for Long Term Goals  Recommendations for other services: Neuropsych   Discharge Criteria: Patient will be discharged from OT if patient refuses treatment 3 consecutive times without medical reason, if treatment goals not met, if there is a change in  medical status, if patient makes no progress towards goals or if patient is discharged from hospital.  The above assessment, treatment plan, treatment alternatives and goals were discussed and mutually agreed upon: by patient  Nicoletta Ba 08/02/2018, 12:32 PM

## 2018-08-02 NOTE — Progress Notes (Addendum)
[place] PHYSICAL MEDICINE & REHABILITATION PROGRESS NOTE   Subjective/Complaints:  Patient has difficulty expressing what he did this morning with therapy.  He has word finding deficits. Review of systems with yes nose able to deny chest pain shortness of breath nausea vomiting diarrhea or constipation  Objective:   No results found. Recent Labs    07/31/18 0730 08/01/18 0423  WBC 5.4 4.9  HGB 14.0 13.6  HCT 42.3 39.9  PLT 239 242   Recent Labs    07/31/18 0730 08/01/18 0423  NA 135 138  K 4.1 3.9  CL 101 103  CO2 17* 19*  GLUCOSE 131* 124*  BUN 16 16  CREATININE 1.03 0.89  CALCIUM 8.9 8.9    Intake/Output Summary (Last 24 hours) at 08/02/2018 1346 Last data filed at 08/02/2018 1207 Gross per 24 hour  Intake -  Output 550 ml  Net -550 ml     Physical Exam: Vital Signs Blood pressure 130/75, pulse 70, resp. rate 20, weight 95 kg, SpO2 100 %.   General: No acute distress Mood and affect are appropriate Heart: Regular rate and rhythm no rubs murmurs or extra sounds Lungs: Clear to auscultation, breathing unlabored, no rales or wheezes Abdomen: Positive bowel sounds, soft nontender to palpation, nondistended Extremities: No clubbing, cyanosis, or edema Skin: No evidence of breakdown, no evidence of rash Neurologic: Oriented to person and place but not time, motor strength is 4/5 in bilateral deltoid, bicep, tricep, grip, hip flexor, knee extensors, ankle dorsiflexor and plantar flexor, Sensory exam normal sensation to light touch and proprioception in bilateral upper and lower extremities Cerebellar exam mild dysmetria in right upper extremity finger-nose-finger Musculoskeletal: Full range of motion in all 4 extremities. No joint swelling   Assessment/Plan: 1. Functional deficits secondary to left subdural hematoma, subarachnoid hemorrhage following a fall at home which require 3+ hours per day of interdisciplinary therapy in a comprehensive inpatient rehab  setting.  Physiatrist is providing close team supervision and 24 hour management of active medical problems listed below.  Physiatrist and rehab team continue to assess barriers to discharge/monitor patient progress toward functional and medical goals  Care Tool:  Bathing    Body parts bathed by patient: Front perineal area, Buttocks   Body parts bathed by helper: Buttocks(incontient care)     Bathing assist Assist Level: Moderate Assistance - Patient 50 - 74%     Upper Body Dressing/Undressing Upper body dressing   What is the patient wearing?: Hospital gown only    Upper body assist Assist Level: Minimal Assistance - Patient > 75%    Lower Body Dressing/Undressing Lower body dressing      What is the patient wearing?: Incontinence brief, Pants(suspenders- needed total A)     Lower body assist Assist for lower body dressing: Maximal Assistance - Patient 25 - 49%     Toileting Toileting    Toileting assist Assist for toileting: Moderate Assistance - Patient 50 - 74%     Transfers Chair/bed transfer  Transfers assist     Chair/bed transfer assist level: Minimal Assistance - Patient > 75%     Locomotion Ambulation   Ambulation assist      Assist level: Moderate Assistance - Patient 50 - 74% Assistive device: Hand held assist Max distance: 25'   Walk 10 feet activity   Assist     Assist level: Moderate Assistance - Patient - 50 - 74% Assistive device: Hand held assist   Walk 50 feet activity   Assist Walk  50 feet with 2 turns activity did not occur: Safety/medical concerns         Walk 150 feet activity   Assist Walk 150 feet activity did not occur: Safety/medical concerns         Walk 10 feet on uneven surface  activity   Assist Walk 10 feet on uneven surfaces activity did not occur: Safety/medical concerns         Wheelchair     Assist Will patient use wheelchair at discharge?: No   Wheelchair activity did not  occur: N/A         Wheelchair 50 feet with 2 turns activity    Assist    Wheelchair 50 feet with 2 turns activity did not occur: N/A       Wheelchair 150 feet activity     Assist Wheelchair 150 feet activity did not occur: N/A          Medical Problem List and Plan: 1.Functional declinesecondary toAMS, 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 Exhibits signs of aphasia C I R PT OT speech evaluations  2. Antithrombotics: -DVT/anticoagulation:Mechanical:Sequential compression devices, below kneeBilateral lower extremities -antiplatelet therapy: N/A 3. Pain Management:Tylenol prn. 4. Mood:LCSW to follow for evaluation and support. -antipsychotic agents: N/A 5. Neuropsych: This patientis notcapable of making decisions on hisown behalf. 6. Skin/Wound Care:routine pressure relief measures. 7. Fluids/Electrolytes/Nutrition:Monitor I/O. Check lytes in am. 8. T2DM: Monitor BS ac/hs. Continue to hold glucotrol for now and use SSI for elevated BS.Adjust regimen as needed. 9. CAD/PAF/A flutter: Off Plavix due to SDH. Monitor HR bid. Continue Cardizem CD and Toprol XL. Will order orthostatic vitals due to reports of dizziness. Order TEDs. Continue to hold Losartan.Pt with regular rhythm at present Vitals:   08/01/18 2000 08/02/18 0905  BP: 133/62 130/75  Pulse: (!) 53 70  Resp: 20   SpO2: 100%   Pressure controlled 10. Left > Right hydrocele: On Augmentin D# 4 11. AKI with hyponateremia: Has resolved with hydration. Recheck on Monday.  12. New onset seizures: Continue Keppra 500 mg bid.  No seizure activity noted since admission to rehabilitation LOS: 1 days A FACE TO FACE EVALUATION WAS PERFORMED  [name] Frank [name] Frank 08/02/2018, 1:46 PM

## 2018-08-02 NOTE — Plan of Care (Signed)
  Problem: Consults Goal: RH BRAIN INJURY PATIENT EDUCATION Description Description: See Patient Education module for eduction specifics Outcome: Progressing Goal: Skin Care Protocol Initiated - if Braden Score 18 or less Description If consults are not indicated, leave blank or document N/A Outcome: Progressing Goal: Nutrition Consult-if indicated Outcome: Progressing Goal: Diabetes Guidelines if Diabetic/Glucose > 140 Description If diabetic or lab glucose is > 140 mg/dl - Initiate Diabetes/Hyperglycemia Guidelines & Document Interventions  Outcome: Progressing   Problem: RH BOWEL ELIMINATION Goal: RH STG MANAGE BOWEL WITH ASSISTANCE Description STG Manage Bowel with Assistance. Outcome: Progressing Goal: RH STG MANAGE BOWEL W/MEDICATION W/ASSISTANCE Description STG Manage Bowel with Medication with Assistance. Outcome: Progressing   Problem: RH BLADDER ELIMINATION Goal: RH STG MANAGE BLADDER WITH ASSISTANCE Description STG Manage Bladder With Assistance Outcome: Progressing Goal: RH STG MANAGE BLADDER WITH MEDICATION WITH ASSISTANCE Description STG Manage Bladder With Medication With Assistance. Outcome: Progressing Goal: RH STG MANAGE BLADDER WITH EQUIPMENT WITH ASSISTANCE Description STG Manage Bladder With Equipment With Assistance Outcome: Progressing   Problem: RH SKIN INTEGRITY Goal: RH STG SKIN FREE OF INFECTION/BREAKDOWN Outcome: Progressing Goal: RH STG MAINTAIN SKIN INTEGRITY WITH ASSISTANCE Description STG Maintain Skin Integrity With Assistance. Outcome: Progressing Goal: RH STG ABLE TO PERFORM INCISION/WOUND CARE W/ASSISTANCE Description STG Able To Perform Incision/Wound Care With Assistance. Outcome: Progressing   Problem: RH SAFETY Goal: RH STG ADHERE TO SAFETY PRECAUTIONS W/ASSISTANCE/DEVICE Description STG Adhere to Safety Precautions With Assistance/Device. Outcome: Progressing Goal: RH STG DECREASED RISK OF FALL WITH  ASSISTANCE Description STG Decreased Risk of Fall With Assistance. Outcome: Progressing   Problem: RH COGNITION-NURSING Goal: RH STG USES MEMORY AIDS/STRATEGIES W/ASSIST TO PROBLEM SOLVE Description STG Uses Memory Aids/Strategies With Assistance to Problem Solve. Outcome: Progressing Goal: RH STG ANTICIPATES NEEDS/CALLS FOR ASSIST W/ASSIST/CUES Description STG Anticipates Needs/Calls for Assist With Assistance/Cues. Outcome: Progressing   Problem: RH PAIN MANAGEMENT Goal: RH STG PAIN MANAGED AT OR BELOW PT'S PAIN GOAL Outcome: Progressing   Problem: RH KNOWLEDGE DEFICIT BRAIN INJURY Goal: RH STG INCREASE KNOWLEDGE OF SELF CARE AFTER BRAIN INJURY Outcome: Progressing Goal: RH STG INCREASE KNOWLEDGE OF DYSPHAGIA/FLUID INTAKE Outcome: Progressing

## 2018-08-02 NOTE — Evaluation (Signed)
Speech Language Pathology Assessment and Plan  Patient Details  Name: [name] Frank MRN: [id number] Date of Birth: 1950/06/19  SLP Diagnosis: Cognitive Impairments  Rehab Potential: Good ELOS: 12 to 14 days    Today's Date: 08/02/2018 SLP Individual Time: 1000-1100 SLP Individual Time Calculation (min): 60 min   Problem List:  Patient Active Problem List   Diagnosis Date Noted  . SDH (subdural hematoma) ([place]) 08/01/2018  . Hydrocephalus in adult [facility]) 07/29/2018  . Hydrocephalus ([place]) 07/28/2018  . Mild renal insufficiency 07/28/2018  . Paroxysmal atrial fibrillation ([place]) 07/28/2018  . Subdural hematoma ([place]) 07/14/2018  . Hypertension, essential 01/16/2018  . Controlled type 2 diabetes mellitus without complication, without long-term current use of insulin ([place]) 01/16/2018  . Coronary artery disease involving native heart without angina pectoris 01/16/2018  . Diabetes mellitus type II, non insulin dependent ([place]) 01/13/2011   Past Medical History:  Past Medical History:  Diagnosis Date  . Diabetes mellitus   . Heart disease   . Hyperlipemia   . Hypertension    Past Surgical History:  Past Surgical History:  Procedure Laterality Date  . catarracts    . OTHER SURGICAL HISTORY  2016   HEART STENT  . TONSILLECTOMY      Assessment / Plan / Recommendation Clinical Impression [name] Frank is a 68 year old male with history of T2DM, HTN, CA, fall down some stairs 07/14/18 with subsequent TBI/SDH and rib fractures.Hospital course significant for A. fib with RVR and he was started on low-dose ASA prior to discharge on 07/22/2018. He was readmitted on 07/28/2018 with 3-day history of progressive lethargy with acute onset of right facial droop and slurred speech. He was noted to be hypotensive with acute renal failureand has had waxing and waning of speech difficulty.  CT of head done grossly stable left frontal parietal SDH,stable right occipital SAH with concerns of  developing communicating hydrocephalus. MRI brain 5/19showed persistent ventriculomegaly with suspicion of developing communicating hydrocephalus andstable left SDH with mass-effect on left hemisphere.Dr. Venetia Constable did not feel that surgery was indicated at this time. NS felt that waxing and waning of symptoms felt to be due to seizures and no clinical signs of hydrocephalus. EEGrevealed left hemispheric slowing with rare sharp left temporal waves suggestive of epileptogenic potential.He was loaded with Keppra and started started on 500 mg p.o. twice daily. He continues to have issues with dizziness, significant cognitive linguistic deficits with perseveration and confusion, weakness affecting mobility as well as ability to carry out ADLs.   CIR recommended due to functional decline. Pt admited on 08/01/18 with cognitive linguistic evaluation completed on 08/02/18. Pt presents with moderate cognitive impairments in the areas of selective attention, semi-complex reasoning and problem solving, recall of information, difficulty following more complex 2 step directions, insight into deficits, and increased processing times. Formal cognitive evaluation was not administered d/t fatigue and time spent problem solving financial situations. Pt with periodic word substitutions but unable to differentiate if related to attention to words or actual word finding deficits. Additionally, pt's speech is intelligible but is produced at significantly slow rate which makes it difficult to comprehend message.  Skilled ST is required to target the above mentioned deficits, increase pt's functional independence and reduce caregiver burden. Anticipate that pt will required Min A when completing higher level cognitive tasks with Outpatient ST indicated.     Skilled Therapeutic Interventions          Skilled treatment session focused on the above mentioned cognitive assessment. Time  also spent aiding pt with communication with  his wife regarding current finances. Pt able to understand and express information but will defer to CSW to facilitate having pt's wife obtain access to his accounts. POC of established with pt and his wife, all questions answered to their satisfaction.    SLP Assessment  Patient will need skilled [facility] during CIR admission    Recommendations  Recommendations for Other Services: Neuropsych consult Patient destination: Home Follow up Recommendations: 24 hour supervision/assistance;Outpatient SLP Equipment Recommended: None recommended by SLP    SLP Frequency 3 to 5 out of 7 days   SLP Duration  SLP Intensity  SLP Treatment/Interventions 12 to 14 days  Minumum of 1-2 x/day, 30 to 90 minutes  Cognitive remediation/compensation;Internal/external aids;Environmental controls;Medication managment;Patient/family education;Functional tasks;Therapeutic Activities    Pain Pain Assessment Pain Scale: 0-10 Pain Score: 0-No pain Faces Pain Scale: No hurt  Prior Functioning Cognitive/Linguistic Baseline: Within functional limits Type of Home: House  Lives With: Spouse Available Help at Discharge: Family;Available 24 hours/day Education: [employer] Vocation: Retired  Industrial/product designer Term Goals: Week 1: SLP Short Term Goal 1 (Week 1): Pt will initiate functional task in 8 out of 10 opportunities with Min A cues.  SLP Short Term Goal 2 (Week 1): Pt will demonstrate selective attention in mildly distracting environment for ~ 30 minutes with Min A cues.  SLP Short Term Goal 3 (Week 1): Pt will solve semi-complex problem solving tasks with Min A cues.  SLP Short Term Goal 4 (Week 1): Pt will demonstrate intellectual awareness by listing 3 physical and cognitive deficits related to acute condition with Min A cues.  SLP Short Term Goal 5 (Week 1): Pt will follow semi-complex 2 step directions in 7 out of 10 opportunities with Min A cues.   Refer to Care Plan for Long Term  Goals  Recommendations for other services: Neuropsych  Discharge Criteria: Patient will be discharged from SLP if patient refuses treatment 3 consecutive times without medical reason, if treatment goals not met, if there is a change in medical status, if patient makes no progress towards goals or if patient is discharged from hospital.  The above assessment, treatment plan, treatment alternatives and goals were discussed and mutually agreed upon: by patient and by family  Charne Mcbrien 08/02/2018, 12:04 PM

## 2018-08-02 NOTE — Evaluation (Addendum)
Physical Therapy Assessment and Plan  Patient Details  Name: [name] Frank MRN: [id number] Date of Birth: 05-Jul-1950  PT Diagnosis: Abnormal posture, Abnormality of gait, Cognitive deficits, Coordination disorder, Difficulty walking, Impaired cognition and Muscle weakness Rehab Potential: Good ELOS: 12-14 days   Today's Date: 08/02/2018 PT Individual Time: 0930-1025 PT Individual Time Calculation (min): 55 min    Problem List:  Patient Active Problem List   Diagnosis Date Noted  . SDH (subdural hematoma) ([place]) 08/01/2018  . Hydrocephalus in adult [facility]) 07/29/2018  . Hydrocephalus ([place]) 07/28/2018  . Mild renal insufficiency 07/28/2018  . Paroxysmal atrial fibrillation ([place]) 07/28/2018  . Subdural hematoma ([place]) 07/14/2018  . Hypertension, essential 01/16/2018  . Controlled type 2 diabetes mellitus without complication, without long-term current use of insulin ([place]) 01/16/2018  . Coronary artery disease involving native heart without angina pectoris 01/16/2018  . Diabetes mellitus type II, non insulin dependent ([place]) 01/13/2011    Past Medical History:  Past Medical History:  Diagnosis Date  . Diabetes mellitus   . Heart disease   . Hyperlipemia   . Hypertension    Past Surgical History:  Past Surgical History:  Procedure Laterality Date  . catarracts    . OTHER SURGICAL HISTORY  2016   HEART STENT  . TONSILLECTOMY      Assessment & Plan Clinical Impression: Patient is a 68 year old male with history of T2DM, HTN, CA, fall down some stairs 07/14/18 with subsequent TBI/SDH and rib fractures.Hospital course significant for A. fib with RVR and he was started on low-dose ASA prior to discharge on 07/22/2018. He was readmitted on 07/28/2018 with 3-day history of progressive lethargy with acute onset of right facial droop and slurred speech. He was noted to be hypotensive with acute renal failureand has had waxing and waning of speech difficulty. Neurology recommended full  work-up including ruling out infection, andEEG to rule out seizures. CT of head done grossly stable left frontal parietal SDH,stable right occipital SAH with concerns of developing communicating hydrocephalus. 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   Dr. Venetia Constable did not feel that surgery was indicated at this time. NS felt that waxing and waning of symptoms felt to be due to seizures and no clinical signs of hydrocephalus. EEGrevealed left hemispheric slowing with rare sharp left temporal waves suggestive of epileptogenic potential.He was loaded with Keppra and started started on 500 mg p.o. twice daily. Scrotal edema with erythema noted on 5/19 and ultrasound revealed large left sided hydrocele and small to moderate right hydrocele therefore Augmentin added for treatment. He continues to have issues with dizziness, significant cognitive linguistic deficits with perseveration and confusion, weakness affecting mobility as well as ability to carry out ADLs. CIR recommended due to functional decline.  Patient transferred to CIR on 08/01/2018 .   Patient currently requires mod with mobility secondary to muscle weakness, decreased cardiorespiratoy endurance, unbalanced muscle activation, decreased coordination and decreased motor planning, decreased initiation, decreased awareness, decreased problem solving, decreased safety awareness, decreased memory and delayed processing and decreased sitting balance, decreased standing balance, decreased postural control and decreased balance strategies.  Prior to hospitalization (prior to initial fall/TBI), patient was independent  with mobility and lived with Spouse in a House home.   Home access is 5Stairs to enter.  Patient will  benefit from skilled PT intervention to maximize safe functional mobility, minimize fall risk and decrease caregiver burden for planned discharge home with 24 hour assist.  Anticipate patient will benefit from follow up [facility] at discharge.  PT - End of Session Activity Tolerance: Tolerates < 10 min activity, no significant change in vital signs Endurance Deficit: Yes Endurance Deficit Description: decreased PT Assessment Rehab Potential (ACUTE/IP ONLY): Good PT Barriers to Discharge: Medical stability;Behavior PT Patient demonstrates impairments in the following area(s): Balance;Behavior;Endurance;Motor;Safety PT Transfers Functional Problem(s): Bed Mobility;Car;Floor;Furniture;Bed to Chair PT Locomotion Functional Problem(s): Ambulation;Stairs PT Plan PT Intensity: Minimum of 1-2 x/day ,45 to 90 minutes PT Frequency: 5 out of 7 days PT Duration Estimated Length of Stay: 12-14 days PT Treatment/Interventions: Ambulation/gait training;Cognitive remediation/compensation;Discharge planning;DME/adaptive equipment instruction;Functional mobility training;Pain management;Psychosocial support;Therapeutic Activities;Splinting/orthotics;Visual/perceptual remediation/compensation;UE/LE Strength taining/ROM;UE/LE Coordination activities;Therapeutic Exercise;Stair training;Skin care/wound management;Patient/family education;Neuromuscular re-education;Functional electrical stimulation;Disease management/prevention;Community reintegration;Balance/vestibular training PT Transfers Anticipated Outcome(s): supervision PT Locomotion Anticipated Outcome(s): CGA household gait PT Recommendation Follow Up Recommendations: Home health PT Patient destination: Home Equipment Recommended: To be determined Equipment Details: has hospital bed and RW  Skilled Therapeutic Intervention  Pt in w/c and agreeable to therapy, denies pain. Total assist w/c transport to/from  therapy gym. Performed functional mobility as detailed below including transfers, gait, stair negotiation, and bed mobility. Additionally performed car transfer w/ mod assist. Pt required many verbal, visual, and manual cues for anterior weight shifting both during functional transitions such as sit<>stands and during gait 2/2 posterior lean bias. Pt reporting increased fatigue this session and requesting to return to room multiple times, agreeable to continue w/ encouragement throughout. Pt also w/ multiple questions about brain recovery, if he can drive again, and when he can go home. Instructed pt in results of PT evaluation as detailed below, PT POC, rehab potential, rehab goals, and discharge recommendations. Called wife to briefly update her on the morning's session, discharge recommendations, rehab goals and rehab potential as well. Returned to room and ended session in supine, all needs in reach.  PT Evaluation Precautions/Restrictions Precautions Precautions: Fall Restrictions Weight Bearing Restrictions: No General   Vital SignsTherapy Vitals Pulse Rate: 70 BP: 130/75 Pain Pain Assessment Pain Scale: 0-10 Pain Score: 0-No pain Faces Pain Scale: No hurt Home Living/Prior Functioning Home Living Available Help at Discharge: Family;Available 24 hours/day Type of Home: House Home Access: Stairs to enter [employer] of Steps: 5 Entrance Stairs-Rails: Can reach both Home Layout: Multi-level;Able to live on main level with bedroom/bathroom(hospital bed on first floor) Bathroom Shower/Tub: Holiday representative Toilet: Handicapped height  Lives With: Spouse Prior Function Level of Independence: Needs assistance with ADLs;Needs assistance with homemaking;Needs assistance with gait;Needs assistance with tranfers(wife providing min assist w/ all ADLs/mobility after first hospital admission, otherwise pt was independent prior to initial fall )  Able to Take Stairs?: Yes(wife  assisted him with steps to access house, independent prior to fall) Driving: Yes(drove prior to initial fall) Vocation: Retired Comments: Pt reports he is retired from [employer] and now works part time for a [employer] Alignment: Within Passenger transport manager Range of Motion: Within Functional Limits Alignment/Gaze Preference: Within Defined Limits Tracking/Visual Pursuits: Able to track stimulus in all quads without difficulty Saccades: Overshoots;Decreased speed of saccadic movement Convergence: Impaired - to be further tested in functional context  Cognition   Sensation Sensation Light Touch: Appears Intact Coordination Gross Motor Movements are Fluid and Coordinated: No Heel Shin Test: decreased speed bilaterally Motor  Motor Motor: Abnormal postural alignment and control Motor - Skilled Clinical Observations: generalized weakness  Mobility Bed Mobility Bed Mobility: Rolling Right;Rolling Left;Supine to Sit;Sit to Supine Rolling Right: Supervision/verbal cueing Rolling Left: Supervision/Verbal cueing Supine to Sit: Minimal Assistance - Patient > 75% Sit to Supine: Minimal Assistance - Patient > 75% Transfers Transfers: Sit to Stand;Stand to Sit;Stand Pivot Transfers Sit to Stand: Moderate Assistance - Patient 50-74% Stand to Sit: Moderate Assistance - Patient 50-74% Stand Pivot Transfers: Minimal Assistance - Patient > 75% Stand Pivot Transfer Details: Tactile cues for initiation;Tactile cues for sequencing;Tactile cues for weight shifting;Tactile cues for posture;Manual facilitation for placement;Manual facilitation for weight shifting;Verbal cues for precautions/safety;Verbal cues for technique;Verbal cues for sequencing Stand Pivot Transfer Details (indicate cue type and reason): Needed manual/verbal cues for anterior weight shifting and forward trunk lean w/ both sitting and standing movements, as well as  tactile cues for anterior weight shifting in stance, pt w/ posterior lean bias Transfer (Assistive device): 1 person hand held assist Locomotion  Gait Ambulation: Yes Gait Assistance: Moderate Assistance - Patient 50-74% Gait Distance (Feet): 25 Feet Assistive device: 1 person hand held assist Gait Assistance Details: Manual facilitation for placement;Manual facilitation for weight bearing;Tactile cues for initiation;Verbal cues for gait pattern;Verbal cues for precautions/safety;Verbal cues for technique Gait Gait: Yes Gait Pattern: Impaired Gait Pattern: Decreased dorsiflexion - left;Decreased stride length;Decreased step length - left;Shuffle;Trunk flexed Gait velocity: decreased Stairs / Additional Locomotion Stairs: Yes Stairs Assistance: Minimal Assistance - Patient > 75% Stair Management Technique: Two rails Number of Stairs: 4 Height of Stairs: 6 Wheelchair Mobility Wheelchair Mobility: No  Trunk/Postural Assessment  Cervical Assessment Cervical Assessment: Exceptions to WFL(forward head) Thoracic Assessment Thoracic Assessment: Within Functional Limits Lumbar Assessment Lumbar Assessment: Exceptions to WFL(posterior pelvic tilt) Postural Control Postural Control: Deficits on evaluation(posterior lean, delayed/absent responses)  Balance Balance Balance Assessed: Yes Static Sitting Balance Static Sitting - Balance Support: No upper extremity supported;Feet supported Static Sitting - Level of Assistance: 5: Stand by assistance Dynamic Sitting Balance Dynamic Sitting - Balance Support: No upper extremity supported;Feet supported Dynamic Sitting - Level of Assistance: 4: Min assist Static Standing Balance Static Standing - Balance Support: During functional activity;No upper extremity supported Static Standing - Level of Assistance: 4: Min assist Dynamic Standing Balance Dynamic Standing - Balance Support: No upper extremity supported;During functional  activity Dynamic Standing - Level of Assistance: 3: Mod assist Extremity Assessment  RLE Assessment RLE Assessment: Exceptions to [facility] Passive Range of Motion (PROM) Comments: WFL RLE Strength RLE Overall Strength: Deficits Right Hip Flexion: 3+/5 Right Hip Extension: 3+/5 Right Hip ABduction: 4-/5 Right Hip ADduction: 4-/5 Right Knee Flexion: 3+/5 Right Knee Extension: 4-/5 Right Ankle Dorsiflexion: 4/5 LLE Assessment LLE Assessment: Exceptions to [facility] Passive Range of Motion (PROM) Comments: WFL LLE Strength LLE Overall Strength: Deficits Left Hip Flexion: 3/5 Left Hip Extension: 3/5 Left Hip ABduction: 3/5 Left Hip ADduction: 3/5 Left Knee Flexion: 3/5 Left Knee Extension: 3+/5 Left Ankle Dorsiflexion: 4-/5    Refer to Care Plan for Long Term Goals  Recommendations for other services: None   Discharge Criteria: Patient will be discharged from PT if patient refuses treatment 3 consecutive times without medical reason, if treatment goals not met, if there is a change in medical status, if patient makes no progress towards goals or if patient is discharged from hospital.  The above assessment, treatment plan, treatment alternatives and goals were discussed and mutually agreed upon: by patient and No family available/patient unable  [place]  08/02/2018, 10:57 AM

## 2018-08-02 NOTE — Progress Notes (Signed)
Arrived to room [phone] via  [facility].  No acute distress noted.  Denies c/o pain or discomfort.  Dois Davenport RN at bedside doing admission information and vital signs.

## 2018-08-03 ENCOUNTER — Inpatient Hospital Stay (HOSPITAL_COMMUNITY): Payer: Medicare HMO | Admitting: Occupational Therapy

## 2018-08-03 ENCOUNTER — Inpatient Hospital Stay (HOSPITAL_COMMUNITY): Payer: Medicare HMO | Admitting: Physical Therapy

## 2018-08-03 LAB — GLUCOSE, CAPILLARY
Glucose-Capillary: 156 mg/dL — ABNORMAL HIGH (ref 70–99)
Glucose-Capillary: 185 mg/dL — ABNORMAL HIGH (ref 70–99)
Glucose-Capillary: 165 mg/dL — ABNORMAL HIGH (ref 70–99)
Glucose-Capillary: 238 mg/dL — ABNORMAL HIGH (ref 70–99)

## 2018-08-03 MED ORDER — TAMSULOSIN HCL 0.4 MG PO CAPS
0.4000 mg | ORAL_CAPSULE | Freq: Every day | ORAL | Status: DC
  Administered 2018-08-03: 0.4 mg via ORAL
  Filled 2018-08-03: qty 1

## 2018-08-03 MED ORDER — DOCUSATE SODIUM 100 MG PO CAPS
100.0000 mg | ORAL_CAPSULE | Freq: Two times a day (BID) | ORAL | Status: DC
  Administered 2018-08-03 – 2018-08-22 (×39): 100 mg via ORAL
  Filled 2018-08-03 (×39): qty 1

## 2018-08-03 MED ORDER — LIDOCAINE HCL URETHRAL/MUCOSAL 2 % EX GEL
1.0000 "application " | CUTANEOUS | Status: DC | PRN
  Administered 2018-08-07 – 2018-08-15 (×2): 1 via URETHRAL
  Filled 2018-08-03 (×9): qty 5

## 2018-08-03 MED ORDER — DOCUSATE SODIUM 50 MG/5ML PO LIQD: 100.0000 mg | Freq: Two times a day (BID) | ORAL | Status: DC

## 2018-08-03 NOTE — Progress Notes (Signed)
Pt assisted to toilet, attempted to urinate in urinal at first then toilet while standing in front of toilet. Unable to start stream. Bladder scan showed . Discussed option of I and O cath with pt, but pt stated he does not want I and O cath. Dr. Wynn Banker made aware. Continue plan of care.   Jamisha Hoeschen W Atilla Zollner

## 2018-08-03 NOTE — IPOC Note (Addendum)
Overall Plan of Care [facility](IPOC) Patient Details Name: [name] HeritageDonald G Vallez MRN: [id number] DOB: [birth year]  Admitting Diagnosis: <principal problem not specified>  Hospital Problems: Active Problems:   SDH (subdural hematoma) (HCC)     Functional Problem List: Nursing Bladder, Bowel, Safety, Motor, Perception  PT Balance, Behavior, Endurance, Motor, Safety  OT Balance, Safety, Cognition, Skin Integrity, Edema, Endurance, Motor, Vision  SLP Cognition, Endurance  TR         Basic ADL's: OT Grooming, Bathing, Dressing, Toileting     Advanced  ADL's: OT       Transfers: PT Bed Mobility, Car, Floor, Furniture, Bed to Chair  [employer] Toilet, Tub/Shower     Locomotion: PT Ambulation, Stairs     Additional Impairments: OT None  SLP Social Cognition   Problem Solving, Memory, Attention, Awareness  TR      Anticipated Outcomes Item Anticipated Outcome  Self Feeding n/a  Swallowing      Basic self-care  supervision  Toileting  supervisoin    Bathroom Transfers supervision   Bowel/Bladder  incontient.  Transfers  supervision  Locomotion  CGA household gait  Communication  Supervision  Cognition  Min A  Pain  3  Safety/Judgment  poor   Therapy Plan: PT Intensity: Minimum of 1-2 x/day ,45 to 90 minutes PT Frequency: 5 out of 7 days PT Duration Estimated Length of Stay: 12-14 days OT Intensity: Minimum of 1-2 x/day, 45 to 90 minutes OT Frequency: 5 out of 7 days OT Duration/Estimated Length of Stay: ~14-16 days SLP Intensity: Minumum of 1-2 x/day, 30 to 90 minutes SLP Frequency: 3 to 5 out of 7 days SLP Duration/Estimated Length of Stay: 12 to 14 days   Due to the current state of emergency, patients may not be receiving their 3-hours of Medicare-mandated therapy.   Team Interventions: Nursing Interventions Patient/Family Education, Pain Management, Medication Management, Skin Care/Wound Management, Psychosocial Support, Discharge Planning, Bladder Management, Bowel  Management  PT interventions Ambulation/gait training, Cognitive remediation/compensation, Discharge planning, DME/adaptive equipment instruction, Functional mobility training, Pain management, Psychosocial support, Therapeutic Activities, Splinting/orthotics, Visual/perceptual remediation/compensation, UE/LE Strength taining/ROM, UE/LE Coordination activities, Therapeutic Exercise, Stair training, Skin care/wound management, Patient/family education, Neuromuscular re-education, Functional electrical stimulation, Disease management/prevention, FirefighterCommunity reintegration, Warden/rangerBalance/vestibular training  OT Interventions Warden/rangerBalance/vestibular training, Discharge planning, Pain management, Therapeutic Activities, Self Care/advanced ADL retraining, UE/LE Coordination activities, Cognitive remediation/compensation, Disease mangement/prevention, Functional mobility training, Patient/family education, Skin care/wound managment, Therapeutic Exercise, Community reintegration, Visual/perceptual remediation/compensation, DME/adaptive equipment instruction, Neuromuscular re-education, Psychosocial support, UE/LE Strength taining/ROM  SLP Interventions Cognitive remediation/compensation, Internal/external aids, Environmental controls, Medication managment, Patient/family education, Functional tasks, Therapeutic Activities  TR Interventions    SW/CM Interventions  Psychosocial Assessment, Discharge Planning, Pt and Family Education   Barriers to Discharge MD  Medical stability  Nursing Incontinence, Medication compliance    PT Medical stability, Behavior    OT      SLP      SW       Team Discharge Planning: Destination: PT-Home ,OT- Home , SLP-Home Projected Follow-up: PT-Home health PT, OT-  Outpatient OT, SLP-24 hour supervision/assistance, Outpatient SLP Projected Equipment Needs: PT-To be determined, OT- To be determined, SLP-None recommended by SLP Equipment Details: PT-has hospital bed and RW, OT-   Patient/family involved in discharge planning: PT- Patient unable/family or caregiver not available, Patient,  OT-Patient, SLP-Patient, Family member/caregiver  MD ELOS: 12-16d Medical Rehab Prognosis:  Good Assessment:  68 year old male with history of T2DM, HTN, CA, fall down some stairs 07/14/18 with subsequent TBI/SDH  and rib fractures.Hospital course significant for A. fib with RVR and he was started on low-dose ASA prior to discharge on 07/22/2018. He was readmitted on 07/28/2018 with 3-day history of progressive lethargy with acute onset of right facial droop and slurred speech. He was noted to be hypotensive with acute renal failureand has had waxing and waning of speech difficulty. Neurology recommended full work-up including ruling out infection, andEEG to rule out seizures. CT of head done grossly stable left frontal parietal SDH,stable right occipital SAH with concerns of developing communicating hydrocephalus. 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   Dr. Johnsie Cancel did not feel that surgery was indicated at this time. NS felt that waxing and waning of symptoms felt to be due to seizures and no clinical signs of hydrocephalus. EEGrevealed left hemispheric slowing with rare sharp left temporal waves suggestive of epileptogenic potential.He was loaded with Keppra and started started on 500 mg p.o. twice daily. Scrotal edema with erythema noted on 5/19 and ultrasound revealed large left sided hydrocele and small to moderate right hydrocele therefore Augmentin added for treatment. He continues to have issues with dizziness, significant cognitive linguistic deficits with perseveration and confusion, weakness  affecting mobility as well as ability to carry out ADLs   Now requiring 24/7 Rehab RN,MD, as well as CIR level PT, OT and SLP.  Treatment team will focus on ADLs and mobility with goals set at Sup See Team Conference Notes for weekly updates to the plan of care

## 2018-08-03 NOTE — Progress Notes (Signed)
[facility] PHYSICAL MEDICINE & REHABILITATION PROGRESS NOTE   Subjective/Complaints:  Discussed with nursing, difficulty voiding, patient states he did void in that nursing is not correct. Also discussed staples that are still present in the occipital area on the right side. Review of systems with yes nose able to deny chest pain shortness of breath nausea vomiting diarrhea or constipation  Objective:   No results found. Recent Labs    08/01/18 0423  WBC 4.9  HGB 13.6  HCT 39.9  PLT 242   Recent Labs    08/01/18 0423  NA 138  K 3.9  CL 103  CO2 19*  GLUCOSE 124*  BUN 16  CREATININE 0.89  CALCIUM 8.9    Intake/Output Summary (Last 24 hours) at 08/03/2018 1140 Last data filed at 08/03/2018 0820 Gross per 24 hour  Intake 480 ml  Output 550 ml  Net -70 ml     Physical Exam: Vital Signs Blood pressure 133/74, pulse (!) 52, temperature 98.5 F (36.9 C), temperature source Oral, resp. rate 18, weight 95 kg, SpO2 97 %.   General: No acute distress Scalp: Staples in place right occiput skin well-healed Mood and affect are appropriate Heart: Regular rate and rhythm no rubs murmurs or extra sounds Lungs: Clear to auscultation, breathing unlabored, no rales or wheezes Abdomen: Positive bowel sounds, soft nontender to palpation, nondistended Extremities: No clubbing, cyanosis, or edema Skin: No evidence of breakdown, no evidence of rash Neurologic: Oriented to person and place but not time, motor strength is 4/5 in bilateral deltoid, bicep, tricep, grip, hip flexor, knee extensors, ankle dorsiflexor and plantar flexor, Sensory exam normal sensation to light touch and proprioception in bilateral upper and lower extremities Cerebellar exam mild dysmetria in right upper extremity finger-nose-finger Musculoskeletal: Full range of motion in all 4 extremities. No joint swelling Patient oriented to person and place but not time  Assessment/Plan: 1. Functional deficits  secondary to left subdural hematoma, subarachnoid hemorrhage following a fall at home which require 3+ hours per day of interdisciplinary therapy in a comprehensive inpatient rehab setting.  Physiatrist is providing close team supervision and 24 hour management of active medical problems listed below.  Physiatrist and rehab team continue to assess barriers to discharge/monitor patient progress toward functional and medical goals  Care Tool:  Bathing    Body parts bathed by patient: Front perineal area, Buttocks   Body parts bathed by helper: Buttocks(incontient care)     Bathing assist Assist Level: Moderate Assistance - Patient 50 - 74%     Upper Body Dressing/Undressing Upper body dressing   What is the patient wearing?: Pull over shirt    Upper body assist Assist Level: Moderate Assistance - Patient 50 - 74%    Lower Body Dressing/Undressing Lower body dressing      What is the patient wearing?: Incontinence brief, Pants     Lower body assist Assist for lower body dressing: Maximal Assistance - Patient 25 - 49%     Toileting Toileting    Toileting assist Assist for toileting: Maximal Assistance - Patient 25 - 49%     Transfers Chair/bed transfer  Transfers assist     Chair/bed transfer assist level: Minimal Assistance - Patient > 75%     Locomotion Ambulation   Ambulation assist      Assist level: Moderate Assistance - Patient 50 - 74% Assistive device: Hand held assist Max distance: 25'   Walk 10 feet activity   Assist     Assist level: Moderate  Assistance - Patient - 50 - 74% Assistive device: Hand held assist   Walk 50 feet activity   Assist Walk 50 feet with 2 turns activity did not occur: Safety/medical concerns         Walk 150 feet activity   Assist Walk 150 feet activity did not occur: Safety/medical concerns         Walk 10 feet on uneven surface  activity   Assist Walk 10 feet on uneven surfaces activity did not  occur: Safety/medical concerns         Wheelchair     Assist Will patient use wheelchair at discharge?: No   Wheelchair activity did not occur: N/A         Wheelchair 50 feet with 2 turns activity    Assist    Wheelchair 50 feet with 2 turns activity did not occur: N/A       Wheelchair 150 feet activity     Assist Wheelchair 150 feet activity did not occur: N/A          Medical Problem List and Plan: 1.Functional declinesecondary toAMS, 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 Exhibits signs of aphasia C I R PT OT speech  2. Antithrombotics: -DVT/anticoagulation:Mechanical:Sequential compression devices, below kneeBilateral lower extremities -antiplatelet therapy: N/A 3. Pain Management:Tylenol prn. 4. Mood:LCSW to follow for evaluation and support. -antipsychotic agents: N/A 5. Neuropsych: This patientis notcapable of making decisions on hisown behalf. 6. Skin/Wound Care:routine pressure relief measures. Occipital wound staples may be removed 7. Fluids/Electrolytes/Nutrition:Monitor I/O. Check lytes in am. 8. T2DM: Monitor BS ac/hs. Continue to hold glucotrol for now and use SSI for elevated BS.Adjust regimen as needed. 9. CAD/PAF/A flutter: Off Plavix due to SDH. Monitor HR bid. Continue Cardizem CD and Toprol XL. Will order orthostatic vitals due to reports of dizziness. Order TEDs. Continue to hold Losartan.Pt with regular rhythm at present Vitals:   08/03/18 0754 08/03/18 0755  BP: 133/74   Pulse: (!) 49 (!) 52  Resp:    Temp:    SpO2: 97%   Pressure controlled 10. Left > Right hydrocele: On Augmentin D# 5, afebrile 11. AKI with hyponateremia: Has resolved with hydration. Recheck on Monday.  12. New onset seizures: Continue Keppra 500 mg bid.  No seizure activity noted since admission to rehabilitation 13.  Urinary retention will  start Flomax monitor for orthostatic hypotension LOS: 2 days A FACE TO FACE EVALUATION WAS PERFORMED  [name] Frank [name] Frank 08/03/2018, 11:40 AM

## 2018-08-03 NOTE — Plan of Care (Signed)
  Problem: Consults Goal: RH BRAIN INJURY PATIENT EDUCATION Description Description: See Patient Education module for eduction specifics Outcome: Progressing Goal: Skin Care Protocol Initiated - if Braden Score 18 or less Description If consults are not indicated, leave blank or document N/A Outcome: Progressing Goal: Nutrition Consult-if indicated Outcome: Progressing Goal: Diabetes Guidelines if Diabetic/Glucose > 140 Description If diabetic or lab glucose is > 140 mg/dl - Initiate Diabetes/Hyperglycemia Guidelines & Document Interventions  Outcome: Progressing   Problem: RH BOWEL ELIMINATION Goal: RH STG MANAGE BOWEL WITH ASSISTANCE Description STG Manage Bowel with Assistance. Outcome: Progressing Goal: RH STG MANAGE BOWEL W/MEDICATION W/ASSISTANCE Description STG Manage Bowel with Medication with Assistance. Outcome: Progressing   Problem: RH BLADDER ELIMINATION Goal: RH STG MANAGE BLADDER WITH ASSISTANCE Description STG Manage Bladder With Assistance Outcome: Progressing Goal: RH STG MANAGE BLADDER WITH MEDICATION WITH ASSISTANCE Description STG Manage Bladder With Medication With Assistance. Outcome: Progressing Goal: RH STG MANAGE BLADDER WITH EQUIPMENT WITH ASSISTANCE Description STG Manage Bladder With Equipment With Assistance Outcome: Progressing   Problem: RH SKIN INTEGRITY Goal: RH STG SKIN FREE OF INFECTION/BREAKDOWN Outcome: Progressing Goal: RH STG MAINTAIN SKIN INTEGRITY WITH ASSISTANCE Description STG Maintain Skin Integrity With Assistance. Outcome: Progressing Goal: RH STG ABLE TO PERFORM INCISION/WOUND CARE W/ASSISTANCE Description STG Able To Perform Incision/Wound Care With Assistance. Outcome: Progressing   Problem: RH SAFETY Goal: RH STG ADHERE TO SAFETY PRECAUTIONS W/ASSISTANCE/DEVICE Description STG Adhere to Safety Precautions With Assistance/Device. Outcome: Progressing Goal: RH STG DECREASED RISK OF FALL WITH  ASSISTANCE Description STG Decreased Risk of Fall With Assistance. Outcome: Progressing   Problem: RH COGNITION-NURSING Goal: RH STG USES MEMORY AIDS/STRATEGIES W/ASSIST TO PROBLEM SOLVE Description STG Uses Memory Aids/Strategies With Assistance to Problem Solve. Outcome: Progressing Goal: RH STG ANTICIPATES NEEDS/CALLS FOR ASSIST W/ASSIST/CUES Description STG Anticipates Needs/Calls for Assist With Assistance/Cues. Outcome: Progressing   Problem: RH PAIN MANAGEMENT Goal: RH STG PAIN MANAGED AT OR BELOW PT'S PAIN GOAL Outcome: Progressing   Problem: RH KNOWLEDGE DEFICIT BRAIN INJURY Goal: RH STG INCREASE KNOWLEDGE OF SELF CARE AFTER BRAIN INJURY Outcome: Progressing Goal: RH STG INCREASE KNOWLEDGE OF DYSPHAGIA/FLUID INTAKE Outcome: Progressing   

## 2018-08-03 NOTE — Progress Notes (Signed)
Occupational Therapy Session Note  Patient Details  Name: [name] Frank MRN: [id number] Date of Birth: 08/24/1950  Today's Date: 08/03/2018 OT Individual Time: 6389-3734 OT Individual Time Calculation (min): 64 min   Short Term Goals: Week 1:  OT Short Term Goal 1 (Week 1): Continue to assess vision  OT Short Term Goal 2 (Week 1): Pt will pick out clothing with mod questining cues  OT Short Term Goal 3 (Week 1): Pt will perform bed mobility to come to EOB in prep for ADL tasks with contact guard OT Short Term Goal 4 (Week 1): Pt will perform toileting tasks with mod A sit to stands OT Short Term Goal 5 (Week 1): Pt will perform 2 grooming tasks in standing to improve standing endurance  Skilled Therapeutic Interventions/Progress Updates:    Pt greeted in bed with RN present to assess vitals, which were WNL. He required max encouragement to get OOB due to sleepiness. "Now? I'm just so tired." With increased time, pt agreeable to participate. OT donned thigh high Teds for BP mgt. Supine<sit completed with Mod A and HOB elevated. It took him a few attempts to stand, and extra time to self organize, but he eventually came into standing with Min A using device. Pt ambulated to bathroom with Min A and vcs for negotiating bathroom threshold. Vcs provided for taking larger steps. He was adamant that he wanted to stand vs sit to urinate. Pt rode walker over toilet and he urinated on back of toilet despite given cues to aim. Pt sat on the adjacent BSC for OT to clean floor and change his gripper socks. Pt then ambulated to sink and engaged in handwashing/oral care while standing with steady assist. Pt with occasional buckling of LEs, but self corrected without cuing. He then completed UB/LB dressing sit<stand from w/c. He required Mod A for button up shirt and Max A for LB due to poor frustration tolerance. Min A for sit<stands. Mod vcs for motor planning. Max vcs for problem solving. At end of session pt was  left in w/c with all needs within reach, safety belt fastened, and setup for breakfast. Tx focus placed on ADL retraining, cognition, functional ambulation, and standing balance.   During session, pt was using the RW with movable wheels in his room. However this device appeared to increase agitation. He would often strike walker onto floor multiple times saying "why does it do this?" There was a 2nd walker behind his door, which we switched over to. He did much better with it, so OT removed the 1st walker from his room.   Therapy Documentation Precautions:  Precautions Precautions: Fall Restrictions Weight Bearing Restrictions: No Pain: No c/o pain during tx    ADL: ADL Eating: Set up Where Assessed-Eating: Chair Grooming: Moderate assistance Where Assessed-Grooming: Sitting at sink Upper Body Bathing: Minimal assistance Where Assessed-Upper Body Bathing: Shower Lower Body Bathing: Moderate assistance Where Assessed-Lower Body Bathing: Shower Upper Body Dressing: Moderate assistance Where Assessed-Upper Body Dressing: Sitting at sink Lower Body Dressing: Maximal assistance Where Assessed-Lower Body Dressing: Sitting at sink Toileting: Maximal assistance Toilet Transfer: Minimal assistance Toilet Transfer Method: Freight forwarder Equipment: Engineer, manufacturing systems Transfer: Minimal assistance Film/video editor Method: Designer, industrial/product: Shower seat with back, Grab bars      Therapy/Group: Individual Therapy  [name] 08/03/2018, 12:34 PM

## 2018-08-03 NOTE — Progress Notes (Signed)
8 staples removed from occipital area. Incision is approximated, with dry scab over site. No new drainage. Pt tolerated well. Continue plan of care.   Justene Jensen W Avacyn Kloosterman

## 2018-08-04 ENCOUNTER — Inpatient Hospital Stay (HOSPITAL_COMMUNITY): Payer: Medicare HMO | Admitting: Occupational Therapy

## 2018-08-04 ENCOUNTER — Inpatient Hospital Stay (HOSPITAL_COMMUNITY): Payer: Medicare HMO

## 2018-08-04 DIAGNOSIS — R400 Somnolence: Secondary | ICD-10-CM

## 2018-08-04 DIAGNOSIS — E669 Obesity, unspecified: Secondary | ICD-10-CM

## 2018-08-04 DIAGNOSIS — N433 Hydrocele, unspecified: Secondary | ICD-10-CM

## 2018-08-04 DIAGNOSIS — D72819 Decreased white blood cell count, unspecified: Secondary | ICD-10-CM

## 2018-08-04 DIAGNOSIS — E8809 Other disorders of plasma-protein metabolism, not elsewhere classified: Secondary | ICD-10-CM

## 2018-08-04 DIAGNOSIS — E46 Unspecified protein-calorie malnutrition: Secondary | ICD-10-CM

## 2018-08-04 DIAGNOSIS — R339 Retention of urine, unspecified: Secondary | ICD-10-CM

## 2018-08-04 DIAGNOSIS — E1169 Type 2 diabetes mellitus with other specified complication: Secondary | ICD-10-CM

## 2018-08-04 LAB — CBC WITH DIFFERENTIAL/PLATELET
Abs Immature Granulocytes: 0.01 10*3/uL (ref 0.00–0.07)
Basophils Absolute: 0 10*3/uL (ref 0.0–0.1)
Basophils Relative: 1 %
Eosinophils Absolute: 0.1 10*3/uL (ref 0.0–0.5)
Eosinophils Relative: 2 %
HCT: 40 % (ref 39.0–52.0)
Hemoglobin: 13.2 g/dL (ref 13.0–17.0)
Immature Granulocytes: 0 %
Lymphocytes Relative: 22 %
Lymphs Abs: 0.7 10*3/uL (ref 0.7–4.0)
MCH: 29 pg (ref 26.0–34.0)
MCHC: 33 g/dL (ref 30.0–36.0)
MCV: 87.9 fL (ref 80.0–100.0)
Monocytes Absolute: 0.4 10*3/uL (ref 0.1–1.0)
Monocytes Relative: 11 %
Neutro Abs: 2.1 10*3/uL (ref 1.7–7.7)
Neutrophils Relative %: 64 %
Platelets: 199 10*3/uL (ref 150–400)
RBC: 4.55 MIL/uL (ref 4.22–5.81)
RDW: 14.2 % (ref 11.5–15.5)
WBC: 3.4 10*3/uL — ABNORMAL LOW (ref 4.0–10.5)
nRBC: 0 % (ref 0.0–0.2)

## 2018-08-04 LAB — URINALYSIS, ROUTINE W REFLEX MICROSCOPIC
Bacteria, UA: NONE SEEN
Bilirubin Urine: NEGATIVE
Glucose, UA: >=500 mg/dL — AB
Hgb urine dipstick: NEGATIVE
Ketones, ur: 80 mg/dL — AB
Leukocytes,Ua: NEGATIVE
Nitrite: NEGATIVE
Protein, ur: NEGATIVE mg/dL
Specific Gravity, Urine: 1.041 — ABNORMAL HIGH (ref 1.005–1.030)
pH: 5 (ref 5.0–8.0)

## 2018-08-04 LAB — GLUCOSE, CAPILLARY
Glucose-Capillary: 168 mg/dL — ABNORMAL HIGH (ref 70–99)
Glucose-Capillary: 157 mg/dL — ABNORMAL HIGH (ref 70–99)
Glucose-Capillary: 156 mg/dL — ABNORMAL HIGH (ref 70–99)
Glucose-Capillary: 151 mg/dL — ABNORMAL HIGH (ref 70–99)

## 2018-08-04 LAB — COMPREHENSIVE METABOLIC PANEL
ALT: 24 U/L (ref 0–44)
AST: 18 U/L (ref 15–41)
Albumin: 3 g/dL — ABNORMAL LOW (ref 3.5–5.0)
Alkaline Phosphatase: 119 U/L (ref 38–126)
Anion gap: 14 (ref 5–15)
BUN: 14 mg/dL (ref 8–23)
CO2: 23 mmol/L (ref 22–32)
Calcium: 8.7 mg/dL — ABNORMAL LOW (ref 8.9–10.3)
Chloride: 99 mmol/L (ref 98–111)
Creatinine, Ser: 0.64 mg/dL (ref 0.61–1.24)
GFR calc Af Amer: >60 mL/min (ref >60)
GFR calc non Af Amer: >60 mL/min (ref >60)
Glucose, Bld: 189 mg/dL — ABNORMAL HIGH (ref 70–99)
Potassium: 3.9 mmol/L (ref 3.5–5.1)
Sodium: 136 mmol/L (ref 135–145)
Total Bilirubin: 1.3 mg/dL — ABNORMAL HIGH (ref 0.3–1.2)
Total Protein: 6.2 g/dL — ABNORMAL LOW (ref 6.5–8.1)

## 2018-08-04 IMAGING — CT CT HEAD WITHOUT CONTRAST
3 of 4 series · 15 of 47 positions shown, 18 images · non-contrast
Comparison: CT brain, [DATE]

CLINICAL DATA: Decreased alertness, evaluate for hydrocephalus

EXAM:
CT HEAD WITHOUT CONTRAST
TECHNIQUE: Contiguous axial images were obtained from the base of the skull
through the vertex without intravenous contrast.

[Series 4: head 2.0 h70h · axial · 0.43mm/px · z∈[-154,-4]mm · 9 of 95 slices shown, 12 images]
[im 10/95  brain]
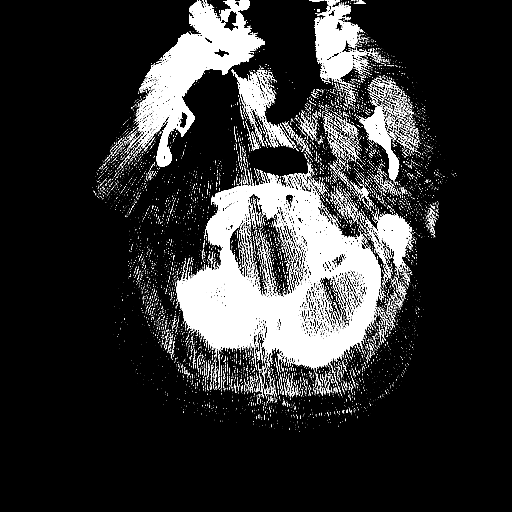
[im 10/95  bone]
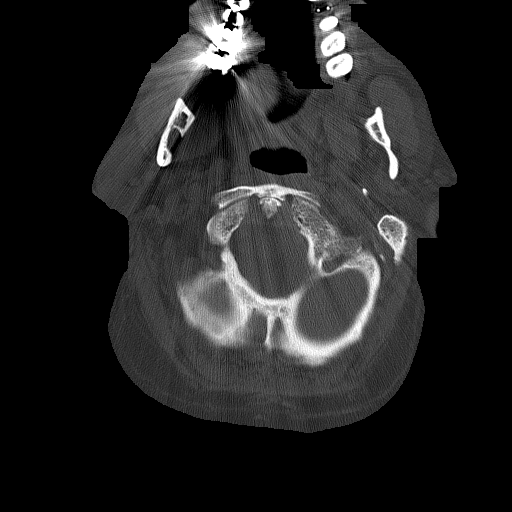
[im 19/95  brain]
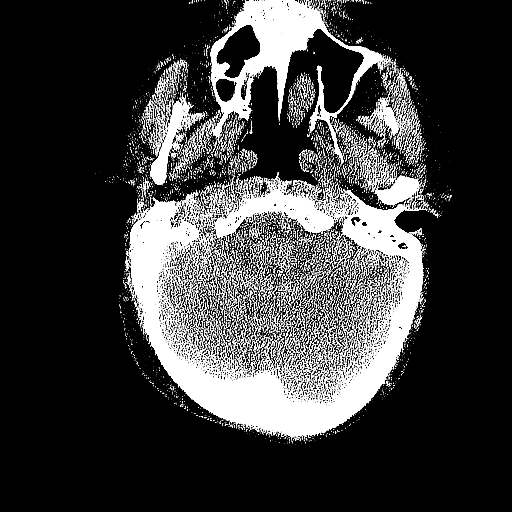
[im 29/95  brain]
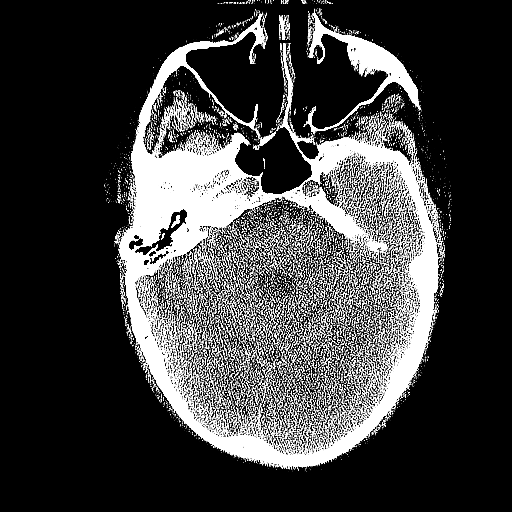
[im 38/95  brain]
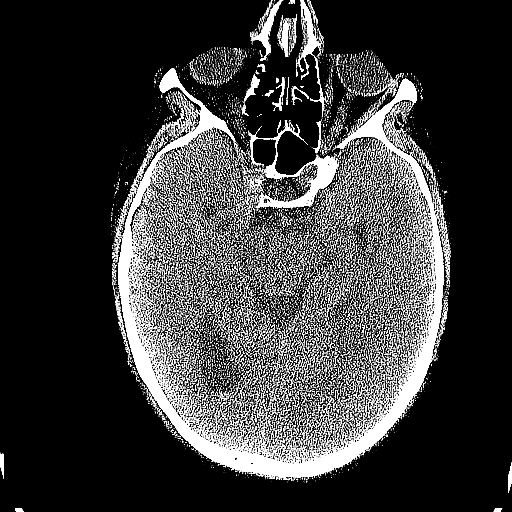
[im 48/95  brain]
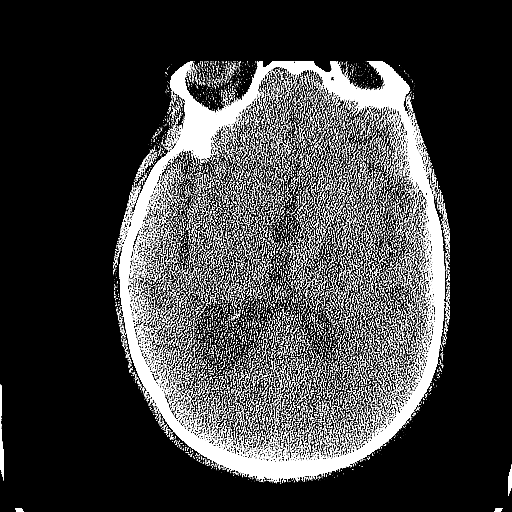
[im 48/95  bone]
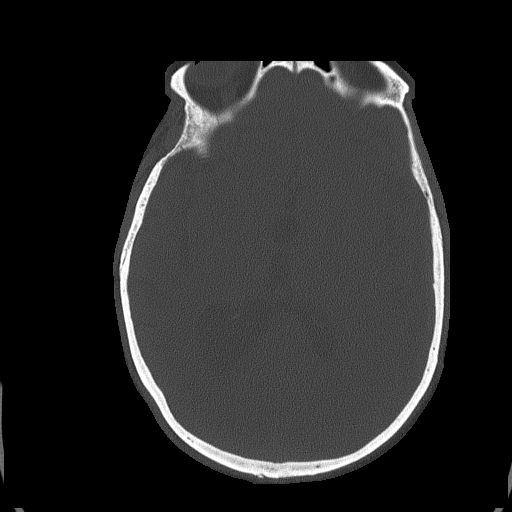
[im 57/95  brain]
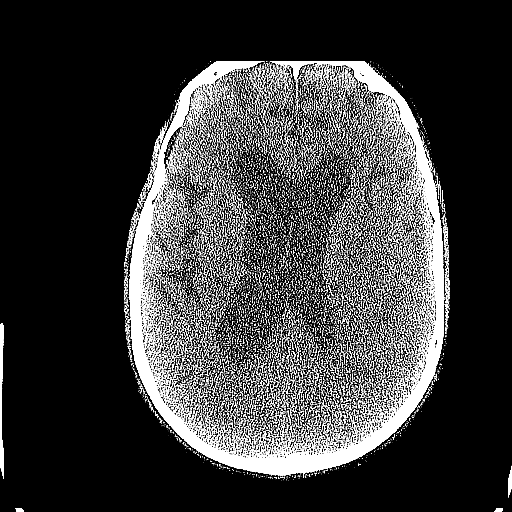
[im 66/95  brain]
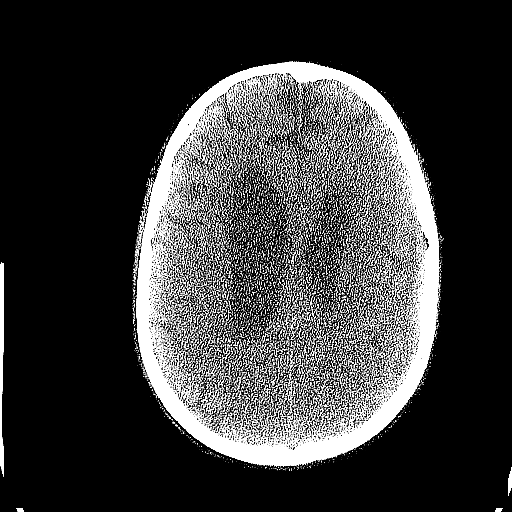
[im 76/95  brain]
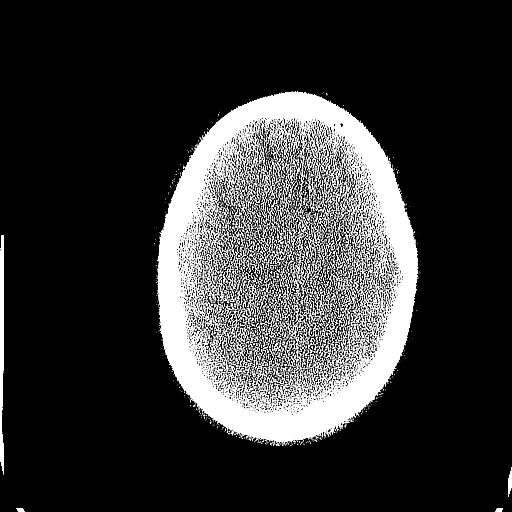
[im 85/95  brain]
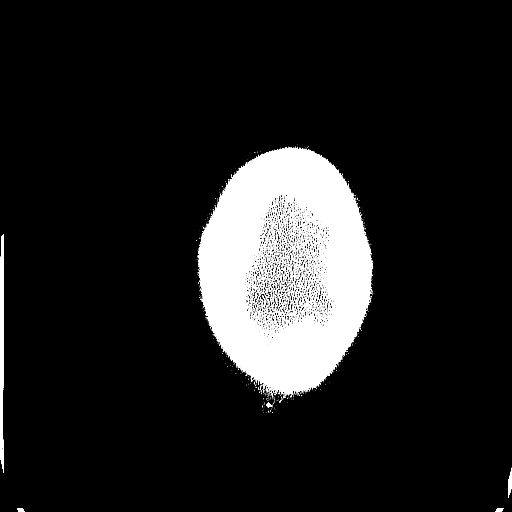
[im 85/95  bone]
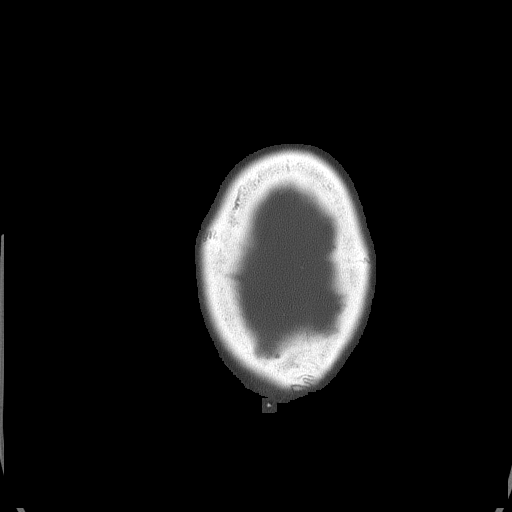

[Series 5: head 3.0 mpr cor · coronal · 0.33mm/px · 3 of 76 slices shown]
[im 26/76  brain]
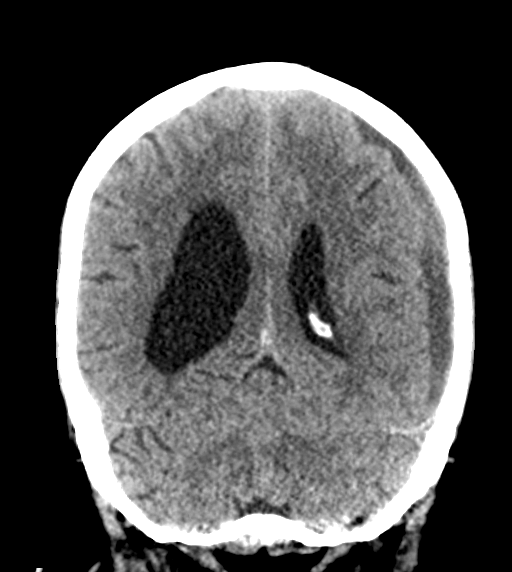
[im 34/76  brain]
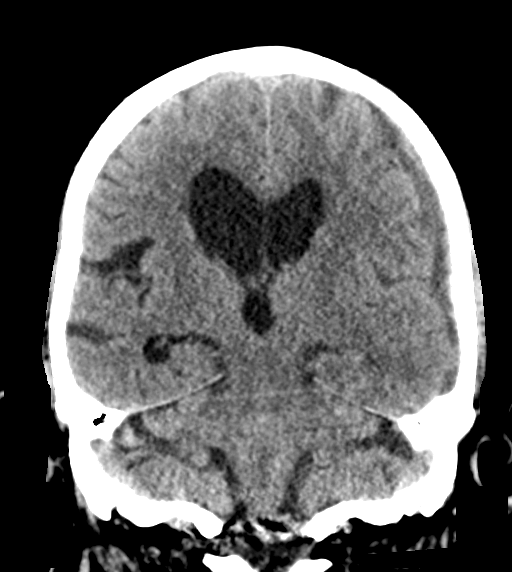
[im 42/76  brain]
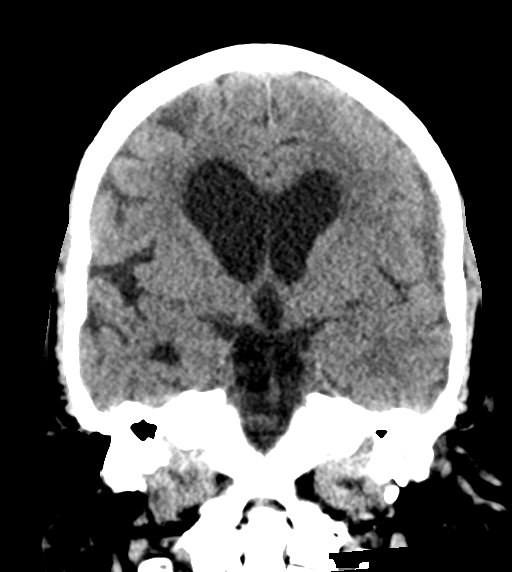

[Series 6: head 3.0 mpr sag · sagittal · 0.37mm/px · 3 of 58 slices shown]
[im 20/58  brain]
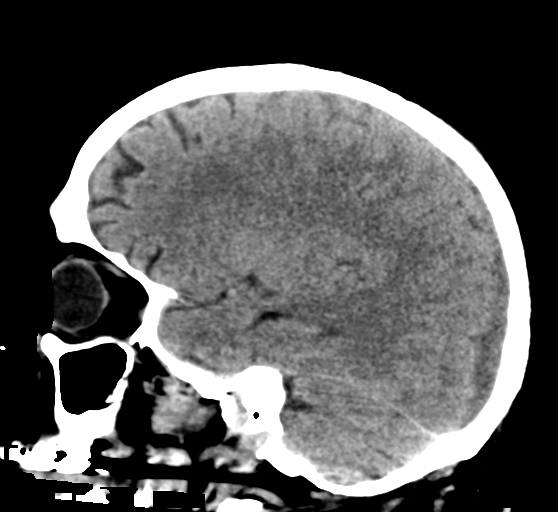
[im 29/58  brain]
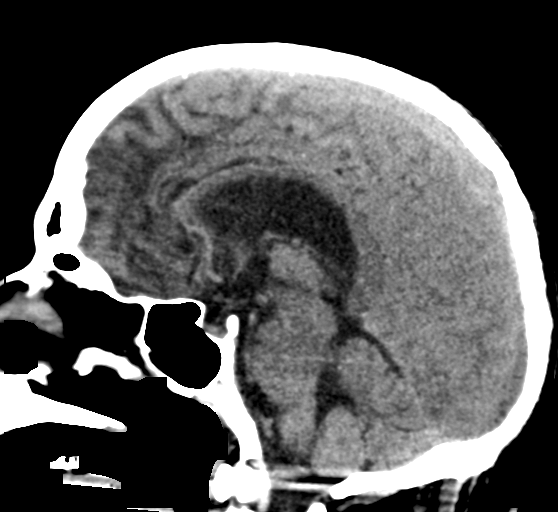
[im 39/58  brain]
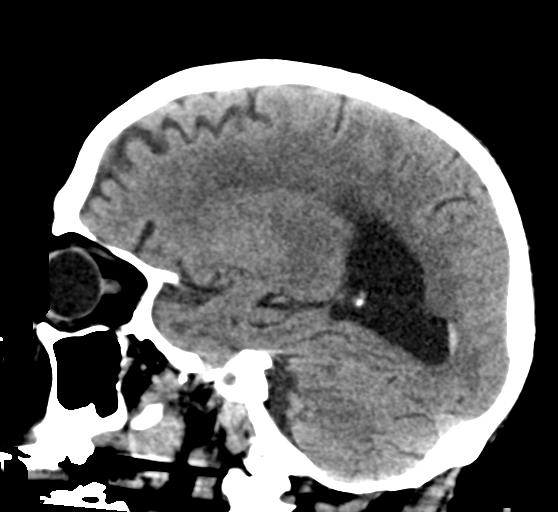

[15 of 47 positions shown; findings below may reference images not displayed]

FINDINGS: Brain: Interval decrease in attenuation of a heterogeneous left
hemispheric collection, which remains unchanged in size at
approximately 9 mm in maximum coronal thickness. There is mild mass
effect on the left hemisphere without significant midline shift and
no change in caliber or configuration of the ventricles. Minimal
dependent blood product in the temporal horn of the right lateral
ventricle. Periventricular white matter hypodensity.

Vascular: No hyperdense vessel or unexpected calcification.

Skull: Normal. Negative for fracture or focal lesion.

Sinuses/Orbits: No acute finding.

Other: None.
IMPRESSION: 1. Interval decrease in attenuation of a heterogeneous left
hemispheric collection, which remains unchanged in size at
approximately 9 mm in maximum coronal thickness. There is mild mass
effect on the left hemisphere without significant midline shift.

2. Unchanged prominence of the lateral and third ventricles.
Minimal, unchanged dependent blood product in the occipital horn of
the right lateral ventricle.

3. Periventricular white matter hypodensity, likely small-vessel
white matter disease.

## 2018-08-04 MED ORDER — SODIUM CHLORIDE 0.9 % IV SOLN
INTRAVENOUS | Status: AC
  Administered 2018-08-05: via INTRAVENOUS

## 2018-08-04 MED ORDER — LEVETIRACETAM 500 MG PO TABS: 1000.0000 mg | ORAL_TABLET | Freq: Two times a day (BID) | ORAL | Status: DC

## 2018-08-04 MED ORDER — PRO-STAT SUGAR FREE PO LIQD
30.0000 mL | Freq: Two times a day (BID) | ORAL | Status: DC
  Administered 2018-08-04 – 2018-08-22 (×35): 30 mL via ORAL
  Filled 2018-08-04 (×34): qty 30

## 2018-08-04 MED ORDER — LEVETIRACETAM 500 MG PO TABS
500.0000 mg | ORAL_TABLET | Freq: Once | ORAL | Status: AC
  Administered 2018-08-04: 500 mg via ORAL

## 2018-08-04 MED ORDER — LEVETIRACETAM 500 MG PO TABS
1000.0000 mg | ORAL_TABLET | Freq: Two times a day (BID) | ORAL | Status: DC
  Administered 2018-08-04 – 2018-08-06 (×4): 1000 mg via ORAL
  Filled 2018-08-04 (×4): qty 2

## 2018-08-04 NOTE — Progress Notes (Signed)
Physical Therapy Session Note  Patient Details  Name: [name] Frank MRN: [id number] Date of Birth: [birth year]  Today's Date: 08/04/2018 PT Individual Time: 1015-1030 PT Individual Time Calculation (min): 15 min  and Today's Date: 08/04/2018 PT Missed Time: 45 Minutes Missed Time Reason: Patient fatigue;Other (Comment)(lethargic)  Short Term Goals: Week 1:  PT Short Term Goal 1 (Week 1): Pt will ambulate 25' w/ min assist using LRAD PT Short Term Goal 2 (Week 1): Pt will perform bed <>chair transfers w/ min assist PT Short Term Goal 3 (Week 1): Pt will demonstrate normal postural control during functional movements w/ min cues  PT Short Term Goal 4 (Week 1): Pt will follow 2-step commands w/o cues consistently during functional mobility   Skilled Therapeutic Interventions/Progress Updates:    Pt supine asleep upon PT arrival, pt difficult to arouse this session and continues to fall back asleep despite verbal and tactile stimuli. Pt opens eyes briefly to therapist but does not keep eyes open and does not verbally respond this session. Therapist assisted pt to sitting EOB in attempt to increase arousal, pt transferred supine>sitting EOB with total assist and requires max assist for sitting balance. Therapist assisted pt back to supine with total assist and left pt with needs in reach and bed alarm set. Pt puts his hand on his head but when asked about a headache pt does not respond. Pt missed 45 minutes of skilled therapy tx secondary to lethargy and fatigue. PA-C (Pam) notified of pts increased fatigue and lethargy this morning limiting his ability to participate in therapy.    Therapy Documentation Precautions:  Precautions Precautions: Fall Restrictions Weight Bearing Restrictions: No   Therapy/Group: Individual Therapy  Cresenciano Genre, PT, DPT 08/04/2018, 7:58 AM

## 2018-08-04 NOTE — Care Management Note (Signed)
[facility] Individual Statement of Services  Patient Name:  [name] Frank  Date:  08/04/2018  Welcome to the [facility].  Our goal is to provide you with an individualized program based on your diagnosis and situation, designed to meet your specific needs.  With this comprehensive rehabilitation program, you will be expected to participate in at least 3 hours of rehabilitation therapies Monday-Friday, with modified therapy programming on the weekends.  Your rehabilitation program will include the following services:  Physical Therapy (PT), Occupational Therapy (OT), Speech Therapy (ST), 24 hour per day rehabilitation nursing, Neuropsychology, Case Management (Social Worker), Rehabilitation Medicine, Nutrition Services and Pharmacy Services  Weekly team conferences will be held on Wednesday to discuss your progress.  Your Social Worker will talk with you frequently to get your input and to update you on team discussions.  Team conferences with you and your family in attendance may also be held.  Expected length of stay: 14-16 days  Overall anticipated outcome: supervision with cueing  Depending on your progress and recovery, your program may change. Your Social Worker will coordinate services and will keep you informed of any changes. Your Social Worker's name and contact numbers are listed  below.  The following services may also be recommended but are not provided by the [facility]patient [facility]:   Driving Evaluations  Home Health Rehabiltiation Services  Outpatient Rehabilitation Services  Vocational Rehabilitation   Arrangements will be made to provide these services after discharge if needed.  Arrangements include referral to agencies that provide these services.  Your insurance has been verified to be:  [employer]-medicare Your primary doctor is:  Warner Mccreedy  Pertinent information will be shared with your doctor and your insurance  company.  Social Worker:  Dossie Der, SW [phone] or ([phone]  Information discussed with and copy given to patient by: Lucy Chris, 08/04/2018, 1:47 PM

## 2018-08-04 NOTE — Progress Notes (Signed)
   08/04/18 1300  Clinical Encounter Type  Visited With Patient  Visit Type Initial  Referral From Nurse  Consult/Referral To Chaplain  Spiritual Encounters  Spiritual Needs Emotional;Prayer  Stress Factors  Patient Stress Factors Exhausted   Responded to spiritual care consult. PT was awake but not able to talk. PT communicated with his hands. I asked if he would like a visit and he gestured with a thumbs up. I introduced myself and offered spiritual care with words of comfort, ministry of presence, and prayer. PT was positive and seemingly thankful for the Chaplain visit.  Chaplain Orest Dikes  [phone]

## 2018-08-04 NOTE — Progress Notes (Signed)
PHYSICAL MEDICINE & REHABILITATION PROGRESS NOTE   Subjective/Complaints: Patient seen sitting up in bed this morning.  He states he slept well overnight.  He states he had a good weekend. Later informed by PA regarding increased lethargy and unarousable.   Review of systems: Appears to deny CP, shortness of breath, nausea, vomiting, diarrhea.  Objective:   No results found. Recent Labs    08/04/18 0618  WBC 3.4*  HGB 13.2  HCT 40.0  PLT 199   Recent Labs    08/04/18 0618  NA 136  K 3.9  CL 99  CO2 23  GLUCOSE 189*  BUN 14  CREATININE 0.64  CALCIUM 8.7*    Intake/Output Summary (Last 24 hours) at 08/04/2018 1047 Last data filed at 08/04/2018 0045 Gross per 24 hour  Intake 600 ml  Output 1050 ml  Net -450 ml     Physical Exam: Vital Signs Blood pressure 128/68, pulse 60, temperature 97.6 F (36.4 C), temperature source Oral, resp. rate 14, weight 95 kg, SpO2 99 %. Constitutional: No distress . Vital signs reviewed. HENT: Normocephalic.  Atraumatic. Eyes: EOMI. No discharge. Cardiovascular: No JVD. Respiratory: Normal effort. GI: Non-distended. Musc: No edema or tenderness in extremities. Neurologic: Alert Motor: Grossly 4/5 throughout  Skin: Warm and dry.  Intact.  Assessment/Plan: 1. Functional deficits secondary to left subdural hematoma, subarachnoid hemorrhage following a fall at home which require 3+ hours per day of interdisciplinary therapy in a comprehensive inpatient rehab setting.  Physiatrist is providing close team supervision and 24 hour management of active medical problems listed below.  Physiatrist and rehab team continue to assess barriers to discharge/monitor patient progress toward functional and medical goals  Care Tool:  Bathing  Bathing activity did not occur: Refused Body parts bathed by patient: Front perineal area, Buttocks   Body parts bathed by helper: Buttocks(incontient care)     Bathing assist Assist Level:  Moderate Assistance - Patient 50 - 74%     Upper Body Dressing/Undressing Upper body dressing   What is the patient wearing?: Button up shirt    Upper body assist Assist Level: Moderate Assistance - Patient 50 - 74%    Lower Body Dressing/Undressing Lower body dressing      What is the patient wearing?: Incontinence brief, Pants     Lower body assist Assist for lower body dressing: Maximal Assistance - Patient 25 - 49%     Toileting Toileting    Toileting assist Assist for toileting: Moderate Assistance - Patient 50 - 74%     Transfers Chair/bed transfer  Transfers assist     Chair/bed transfer assist level: Minimal Assistance - Patient > 75%     Locomotion Ambulation   Ambulation assist      Assist level: Moderate Assistance - Patient 50 - 74% Assistive device: Hand held assist Max distance: 25'   Walk 10 feet activity   Assist     Assist level: Moderate Assistance - Patient - 50 - 74% Assistive device: Hand held assist   Walk 50 feet activity   Assist Walk 50 feet with 2 turns activity did not occur: Safety/medical concerns         Walk 150 feet activity   Assist Walk 150 feet activity did not occur: Safety/medical concerns         Walk 10 feet on uneven surface  activity   Assist Walk 10 feet on uneven surfaces activity did not occur: Safety/medical concerns  Wheelchair     Assist Will patient use wheelchair at discharge?: No   Wheelchair activity did not occur: N/A         Wheelchair 50 feet with 2 turns activity    Assist    Wheelchair 50 feet with 2 turns activity did not occur: N/A       Wheelchair 150 feet activity     Assist Wheelchair 150 feet activity did not occur: N/A          Medical Problem List and Plan: 1.Functional declinesecondary toAMS, 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 Exhibits signs of  aphasia  Continue CIR  Weekend notes reviewed-stable.   Stat head CT ordered.  2. Antithrombotics: -DVT/anticoagulation:Mechanical:Sequential compression devices, below kneeBilateral lower extremities -antiplatelet therapy: N/A 3. Pain Management:Tylenol prn. 4. Mood:LCSW to follow for evaluation and support. -antipsychotic agents: N/A 5. Neuropsych: This patientis notcapable of making decisions on hisown behalf. 6. Skin/Wound Care:routine pressure relief measures.  7. Fluids/Electrolytes/Nutrition:Monitor I/Os. 8. T2DM: Monitor BS ac/hs. Continue to hold glucotrol for now and use SSI for elevated BS.Adjust regimen as needed.  Labile and elevated on 5/25 9. CAD/PAF/A flutter: Off Plavix due to SDH. Monitor HR bid. Continue Cardizem CD and Toprol XL.   Ordered orthostatic vitals due to reports of dizziness. Ordered TEDs. Continue to hold Losartan. Vitals:   08/04/18 0434 08/04/18 0800  BP: 126/68 128/68  Pulse: (!) 53 60  Resp: 14   Temp: 97.6 F (36.4 C)   SpO2: 99%    BP controlled on 5/25 10. Left > Right hydrocele: On Augmentin   Repeat UA/Ucx ordered 11. AKI with hyponateremia: Has resolved with hydration. .  12. New onset seizures: Continue Keppra 500 mg bid.  No seizure activity noted since admission to rehabilitation 13.  Urinary retention:  Flomax DC'd due to lethargy, will consider restarting based on results of work-up 14.  Hypoalbuminemia  Supplement initiated on 5/25 15.  Leukopenia  WBC 3.4 on 5/25, labs ordered for tomorrow   LOS: 3 days A FACE TO FACE EVALUATION WAS PERFORMED  [name] Frank [name] Frank [name] Frank 08/04/2018, 10:47 AM

## 2018-08-04 NOTE — Progress Notes (Signed)
Speech Language Pathology Daily Session Note  Patient Details  Name: [name] Frank MRN: [id number] Date of Birth: Dec 26, 1950  Today's Date: 08/04/2018 SLP Individual Time: 0800-0830 SLP Individual Time Calculation (min): 30 min  Short Term Goals: Week 1: SLP Short Term Goal 1 (Week 1): Pt will initiate functional task in 8 out of 10 opportunities with Min A cues.  SLP Short Term Goal 2 (Week 1): Pt will demonstrate selective attention in mildly distracting environment for ~ 30 minutes with Min A cues.  SLP Short Term Goal 3 (Week 1): Pt will solve semi-complex problem solving tasks with Min A cues.  SLP Short Term Goal 4 (Week 1): Pt will demonstrate intellectual awareness by listing 3 physical and cognitive deficits related to acute condition with Min A cues.  SLP Short Term Goal 5 (Week 1): Pt will follow semi-complex 2 step directions in 7 out of 10 opportunities with Min A cues.   Skilled Therapeutic Interventions:Skilled ST services focused on cognitive skills. SLP attempted to administer formal cognitive linguistic assessment, Cognistat, however limited by increasingly severe fatigue resulting in pt missing 15 minutes of skilled ST services. Pt demonstrated awareness of deficits in attention. Pt demonstrated orientation x4, severe sustained attention, ability to follow only 1 step commands, and was unable to immediately recall 4 words, suggest due to increasing fatigue. As pt demonstrated increase fatigue pt demonstrated only nonverbal communication and was unable to place lunch order despite max A verbal cues for verbal and/or non verbal response to yes/no questions, then pt fell asleep. Pt was left in room with call bell within reach and bed alarm set. ST recommends to continue skilled ST services.      Pain Pain Assessment Pain Score: 0-No pain  Therapy/Group: Individual Therapy  Shyteria Lewis  [facility] 08/04/2018, 8:33 AM

## 2018-08-04 NOTE — Progress Notes (Addendum)
Discussed patient with Dr. Marcia Brash and Dr. Allena Katz. CT showing improvement--patient likely  Having seizures--will increase Keppra to 1000 mg bid.   Attempted to call wife for update but no answer and no VM set up.

## 2018-08-04 NOTE — IPOC Note (Signed)
Individualized overall Plan of Care [facility](IPOC) Patient Details Name: [name] Frank MRN: [id number] DOB: Feb 02, 1951  Admitting Diagnosis: TBI  Hospital Problems: Active Problems:   SDH (subdural hematoma) (HCC)   Leukopenia   Hypoalbuminemia due to protein-calorie malnutrition (HCC)   Urinary retention   Hydrocele   Diabetes mellitus type 2 in obese (HCC)   Somnolence     Functional Problem List: Nursing Bladder, Bowel, Safety, Motor, Perception  PT Balance, Behavior, Endurance, Motor, Safety  OT Balance, Safety, Cognition, Skin Integrity, Edema, Endurance, Motor, Vision  SLP Cognition, Endurance  TR         Basic ADL's: OT Grooming, Bathing, Dressing, Toileting     Advanced  ADL's: OT       Transfers: PT Bed Mobility, Car, Floor, Furniture, Bed to Chair  [employer] Toilet, Tub/Shower     Locomotion: PT Ambulation, Stairs     Additional Impairments: OT None  SLP Social Cognition   Problem Solving, Memory, Attention, Awareness  TR      Anticipated Outcomes Item Anticipated Outcome  Self Feeding n/a  Swallowing      Basic self-care  supervision  Toileting  supervisoin    Bathroom Transfers supervision   Bowel/Bladder  incontient.  Transfers  supervision  Locomotion  CGA household gait  Communication  Supervision  Cognition  Min A  Pain  3  Safety/Judgment  poor   Therapy Plan: PT Intensity: Minimum of 1-2 x/day ,45 to 90 minutes PT Frequency: 5 out of 7 days PT Duration Estimated Length of Stay: 12-14 days OT Intensity: Minimum of 1-2 x/day, 45 to 90 minutes OT Frequency: 5 out of 7 days OT Duration/Estimated Length of Stay: ~14-16 days SLP Intensity: Minumum of 1-2 x/day, 30 to 90 minutes SLP Frequency: 3 to 5 out of 7 days SLP Duration/Estimated Length of Stay: 12 to 14 days    Team Interventions: Nursing Interventions Patient/Family Education, Pain Management, Medication Management, Skin Care/Wound Management, Psychosocial Support, Discharge  Planning, Bladder Management, Bowel Management  PT interventions Ambulation/gait training, Cognitive remediation/compensation, Discharge planning, DME/adaptive equipment instruction, Functional mobility training, Pain management, Psychosocial support, Therapeutic Activities, Splinting/orthotics, Visual/perceptual remediation/compensation, UE/LE Strength taining/ROM, UE/LE Coordination activities, Therapeutic Exercise, Stair training, Skin care/wound management, Patient/family education, Neuromuscular re-education, Functional electrical stimulation, Disease management/prevention, FirefighterCommunity reintegration, Warden/rangerBalance/vestibular training  OT Interventions Warden/rangerBalance/vestibular training, Discharge planning, Pain management, Therapeutic Activities, Self Care/advanced ADL retraining, UE/LE Coordination activities, Cognitive remediation/compensation, Disease mangement/prevention, Functional mobility training, Patient/family education, Skin care/wound managment, Therapeutic Exercise, Community reintegration, Visual/perceptual remediation/compensation, DME/adaptive equipment instruction, Neuromuscular re-education, Psychosocial support, UE/LE Strength taining/ROM  SLP Interventions Cognitive remediation/compensation, Internal/external aids, Environmental controls, Medication managment, Patient/family education, Functional tasks, Therapeutic Activities  TR Interventions    SW/CM Interventions     Barriers to Discharge MD  Medical stability  Nursing Incontinence, Medication compliance    PT Medical stability, Behavior    OT      SLP      SW       Team Discharge Planning: Destination: PT-Home ,OT- Home , SLP-Home Projected Follow-up: PT-Home health PT, OT-  Outpatient OT, SLP-24 hour supervision/assistance, Outpatient SLP Projected Equipment Needs: PT-To be determined, OT- To be determined, SLP-None recommended by SLP Equipment Details: PT-has hospital bed and RW, OT-  Patient/family involved in discharge  planning: PT- Patient unable/family or caregiver not available, Patient,  OT-Patient, SLP-Patient, Family member/caregiver  MD ELOS: 10-14 days. Medical Rehab Prognosis:  Good Assessment: 68 year old male with history of T2DM, HTN, CA, fall down some stairs 07/14/18  with subsequent TBI/SDH and rib fractures.Hospital course significant for A. fib with RVR and he was started on low-dose ASA prior to discharge on 07/22/2018. He was readmitted on 07/28/2018 with 3-day history of progressive lethargy with acute onset of right facial droop and slurred speech. He was noted to be hypotensive with acute renal failureand has had waxing and waning of speech difficulty. Neurology recommended full work-up including ruling out infection, andEEG to rule out seizures. CT of head done grossly stable left frontal parietal SDH,stable right occipital SAH with concerns of developing communicating hydrocephalus. 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  Dr. Johnsie Cancel did not feel that surgery was indicated at this time. NS felt that waxing and waning of symptoms felt to be due to seizures and no clinical signs of hydrocephalus. EEGrevealed left hemispheric slowing with rare sharp left temporal waves suggestive of epileptogenic potential.He was loaded with Keppra and started started on 500 mg p.o. twice daily. Scrotal edema with erythema noted on 5/19 and ultrasound revealed large left sided hydrocele and small to moderate right hydrocele therefore Augmentin added for treatment. He continues to have issues with dizziness, significant cognitive linguistic deficits with perseveration and confusion, weakness affecting mobility as well as ability  to carry out ADLs. All of this further complicated by episode of unresponsiveness on 5/25, work-up in progress.  We will plan to set goals for supervision with PT/OT and min a with cognition with SLP.  Due to the current state of emergency, patients may not be receiving their 3-hours of Medicare-mandated therapy.  See Team Conference Notes for weekly updates to the plan of care

## 2018-08-04 NOTE — Progress Notes (Signed)
Social Work  Social Work Assessment and Plan  Patient Details  Name: [name] Frank MRN: [id number] Date of Birth: 23-Jul-1950  Today's Date: 08/04/2018  Problem List:  Patient Active Problem List   Diagnosis Date Noted  . Leukopenia   . Hypoalbuminemia due to protein-calorie malnutrition (HCC)   . Urinary retention   . Hydrocele   . Diabetes mellitus type 2 in obese (HCC)   . Somnolence   . SDH (subdural hematoma) (HCC) 08/01/2018  . Hydrocephalus in adult [facility]) 07/29/2018  . Hydrocephalus (HCC) 07/28/2018  . Mild renal insufficiency 07/28/2018  . Paroxysmal atrial fibrillation (HCC) 07/28/2018  . Subdural hematoma (HCC) 07/14/2018  . Hypertension, essential 01/16/2018  . Controlled type 2 diabetes mellitus without complication, without long-term current use of insulin (HCC) 01/16/2018  . Coronary artery disease involving native heart without angina pectoris 01/16/2018  . Diabetes mellitus type II, non insulin dependent (HCC) 01/13/2011   Past Medical History:  Past Medical History:  Diagnosis Date  . Diabetes mellitus   . Heart disease   . Hyperlipemia   . Hypertension    Past Surgical History:  Past Surgical History:  Procedure Laterality Date  . catarracts    . OTHER SURGICAL HISTORY  2016   HEART STENT  . TONSILLECTOMY     Social History:  reports that he has never smoked. He has never used smokeless tobacco. He reports current alcohol use. He reports that he does not use drugs.  Family / Support Systems Marital Status: Married Patient Roles: Spouse, Parent, Other (Comment)(Part time employee) Spouse/Significant Other: Lynda [phone]-cell  [phone]-home Children: grown children Other Supports: Friends and church members Anticipated Caregiver: Lynda Ability/Limitations of Caregiver: She has assisted since he came home 5/12 then re-admitted 5/18 Caregiver Availability: 24/7 Family Dynamics: Close knit with wife and children who are involved. They have friends  and church members who are supportive and will try to assist and help his wife with pt.  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 She is interested in getting POA and this can not be done at this time due to pt being incapble, if progresses this may change. Aware it is up to the MD to determine this.   Abuse/Neglect Abuse/Neglect Assessment Can Be Completed: Yes Physical Abuse: Denies Verbal Abuse: Denies Sexual Abuse: Denies Exploitation of patient/patient's resources: Denies Self-Neglect: Denies  Emotional Status Pt's affect, behavior and adjustment status: Pt is lucide then confused he waxes and wanes in between. He has alwasy been one who was independent and taken care of himself, this was all before the fall 5/4. His wife is used to him taking cae of things and feels lost without him doing those duties. Recent Psychosocial Issues: Other health issues, but mainly the fall which caused SDH/BI and fractured ribs Psychiatric History: No history deferred depression screen due to pt not completely lucid and able to complete this assessment. Do feel with all that has happened to him he would benefit from seeing neuro-psych while here Substance Abuse History: No issues  Patient / Family Perceptions, Expectations & Goals Pt/Family understanding of illness & functional limitations: Wife can explain the first hospitalization and how she thought he came home  too early and she is wanting medical update and word from the doctor. Have asked the PA to contact her and answer her medical questions.  Premorbid pt/family roles/activities: Husband, father, employee, nieghbor, friend, etc Anticipated changes in roles/activities/participation: resume Pt/family expectations/goals: Wife states: " This has all been a nightmare and one I don't want to relive." " I was helping him but felt very inadequate and needed more information about what is going on."  [employer] IncCommunity Resources Community Agencies: None Premorbid Home Care/DME Agencies: Other (Comment)([facility] was suppose to see prior to admission) Transportation available at discharge: Wife Resource referrals recommended: Neuropsychology, Support group (specify)  Discharge Planning Living Arrangements: Spouse/significant other Support Systems: Spouse/significant other, Children, Friends/neighbors, Psychologist, clinicalChurch/faith community Type of Residence: Private residence Insurance Resources: Media plannerrivate Insurance (specify)([place] medicare) Financial Resources: Employment, Restaurant manager, fast foodocial Security Financial Screen Referred: No Living Expenses: Own Money Management: Patient Does the patient have any problems obtaining your medications?: No Home Management: Wife Patient/Family Preliminary Plans: Return home with his wife assisting, but she wants to be better prepared this time when he is discharged from the hospital. She is having difficulty with paying the bills since pt did this. Disucssed he is not able to sign POA or handle this at this time due to his cognitive issues. She seems to understand this and will await his progress to see if he can manage or sing POA forms.  Social Work Anticipated Follow Up Needs: HH/OP, Support Group  Clinical Impression Pt is confused and quite lethargic, he is not able to particpate in completing my assessment. Obtained information form his wife who seems to be very overwhelmed and asking many  questions. Have asked Pam-PA to call her to address her medical questions. Aware pt not able to sign POA or make financial decisions at this time. Wife is able to assist but will need education prior to DC since this was done form acute when he went home the first time. Will work on discharge needs.  Lucy Chrisupree, Amberlynn Tempesta G 08/04/2018, 1:42 PM

## 2018-08-04 NOTE — Progress Notes (Signed)
Patient difficult to arouse and non-verbal. Barely opens eyes and question right ptosis. Pupils equal and responsive. Does withdraw to pain. Labs today without significant abnormality. Stat CT  Head ordered as patient at risk for hydrocephalus. Will order UA/UCS to rule out infection--has bilateral hydrocele and on amoxicillin. Has decrease in scrotal erythema and edema this am. Has had intermittent issues with urinary retention and incontinence.

## 2018-08-04 NOTE — Progress Notes (Signed)
Occupational Therapy Session Note  Patient Details  Name: [name] Frank MRN: [id number] Date of Birth: [birth year]  Today's Date: 08/04/2018 OT Individual Time: 3244-0102 OT Individual Time Calculation (min): 56 min    Short Term Goals: Week 1:  OT Short Term Goal 1 (Week 1): Continue to assess vision  OT Short Term Goal 2 (Week 1): Pt will pick out clothing with mod questining cues  OT Short Term Goal 3 (Week 1): Pt will perform bed mobility to come to EOB in prep for ADL tasks with contact guard OT Short Term Goal 4 (Week 1): Pt will perform toileting tasks with mod A sit to stands OT Short Term Goal 5 (Week 1): Pt will perform 2 grooming tasks in standing to improve standing endurance  Skilled Therapeutic Interventions/Progress Updates:    Pt supine in bed at start of session with eyes closed.  He opened his eyes briefly to command but would not maintain them open.  He responded with a thumbs up when asked if his name was "Tooker", but would not respond verbally to any questions throughout session.  He needed max assist for rolling to the left and for sidelying to sit.  Once sitting he could maintain his sitting balance with min guard assist before completing stand pivot transfer to the wheelchair with max facilitation.  Took him over to the sink in the wheelchair for bathing and dressing.  Max hand over hand or less to complete any bathing, outside of him washing his face.  He would attempt frequently to prop his elbow or elbows on the sink and place his head in his hand.  Mod assist for sit to stand with total assist for washing peri area and cleaning buttocks as well as donning new brief.  Finished session with total assist for donning new hospital gown and gripper socks.  Pt was then returned to the bed stand pivot with mod assist for transfers and for sit to supine.  Call button and phone in reach with bed alarm in place.    Therapy Documentation Precautions:  Precautions Precautions:  Fall Restrictions Weight Bearing Restrictions: No  Pain: Pain Assessment Pain Scale: Faces Pain Score: 0-No pain ADL: See Care Tool Section for some details of ADLs  Therapy/Group: Individual Therapy  Mareena Cavan OTR/L 08/04/2018, 3:53 PM

## 2018-08-05 ENCOUNTER — Inpatient Hospital Stay (HOSPITAL_COMMUNITY): Payer: Medicare HMO

## 2018-08-05 ENCOUNTER — Inpatient Hospital Stay (HOSPITAL_COMMUNITY): Payer: Medicare HMO | Admitting: Occupational Therapy

## 2018-08-05 ENCOUNTER — Inpatient Hospital Stay (HOSPITAL_COMMUNITY): Payer: Medicare HMO | Admitting: Speech Pathology

## 2018-08-05 LAB — CBC WITH DIFFERENTIAL/PLATELET
Abs Immature Granulocytes: 0.01 10*3/uL (ref 0.00–0.07)
Basophils Absolute: 0 10*3/uL (ref 0.0–0.1)
Basophils Relative: 1 %
Eosinophils Absolute: 0 10*3/uL (ref 0.0–0.5)
Eosinophils Relative: 1 %
HCT: 39.1 % (ref 39.0–52.0)
Hemoglobin: 13 g/dL (ref 13.0–17.0)
Immature Granulocytes: 0 %
Lymphocytes Relative: 19 %
Lymphs Abs: 0.7 10*3/uL (ref 0.7–4.0)
MCH: 29 pg (ref 26.0–34.0)
MCHC: 33.2 g/dL (ref 30.0–36.0)
MCV: 87.3 fL (ref 80.0–100.0)
Monocytes Absolute: 0.4 10*3/uL (ref 0.1–1.0)
Monocytes Relative: 12 %
Neutro Abs: 2.4 10*3/uL (ref 1.7–7.7)
Neutrophils Relative %: 67 %
Platelets: 199 10*3/uL (ref 150–400)
RBC: 4.48 MIL/uL (ref 4.22–5.81)
RDW: 14.2 % (ref 11.5–15.5)
WBC: 3.6 10*3/uL — ABNORMAL LOW (ref 4.0–10.5)
nRBC: 0 % (ref 0.0–0.2)

## 2018-08-05 LAB — URINE CULTURE
Culture: NO GROWTH
Special Requests: NORMAL

## 2018-08-05 LAB — GLUCOSE, CAPILLARY
Glucose-Capillary: 149 mg/dL — ABNORMAL HIGH (ref 70–99)
Glucose-Capillary: 239 mg/dL — ABNORMAL HIGH (ref 70–99)
Glucose-Capillary: 201 mg/dL — ABNORMAL HIGH (ref 70–99)
Glucose-Capillary: 240 mg/dL — ABNORMAL HIGH (ref 70–99)

## 2018-08-05 MED ORDER — METHYLPHENIDATE HCL 5 MG PO TABS
5.0000 mg | ORAL_TABLET | Freq: Two times a day (BID) | ORAL | Status: DC
  Administered 2018-08-05 – 2018-08-13 (×17): 5 mg via ORAL
  Filled 2018-08-05 (×17): qty 1

## 2018-08-05 MED ORDER — SODIUM CHLORIDE 0.9 % IV SOLN
INTRAVENOUS | Status: DC
  Filled 2018-08-05 (×2): qty 1000

## 2018-08-05 MED ORDER — POTASSIUM CHLORIDE IN NACL 20-0.9 MEQ/L-% IV SOLN
INTRAVENOUS | Status: DC
  Administered 2018-08-05: 16:00:00 via INTRAVENOUS
  Filled 2018-08-05: qty 1000

## 2018-08-05 NOTE — Plan of Care (Signed)
  Problem: Consults Goal: RH BRAIN INJURY PATIENT EDUCATION Description: Description: See Patient Education module for eduction specifics Outcome: Progressing Goal: Skin Care Protocol Initiated - if Braden Score 18 or less Description: If consults are not indicated, leave blank or document N/A Outcome: Progressing Goal: Nutrition Consult-if indicated Outcome: Progressing Goal: Diabetes Guidelines if Diabetic/Glucose > 140 Description: If diabetic or lab glucose is > 140 mg/dl - Initiate Diabetes/Hyperglycemia Guidelines & Document Interventions  Outcome: Progressing   Problem: RH BOWEL ELIMINATION Goal: RH STG MANAGE BOWEL WITH ASSISTANCE Description: STG Manage Bowel with Assistance. Outcome: Progressing Goal: RH STG MANAGE BOWEL W/MEDICATION W/ASSISTANCE Description: STG Manage Bowel with Medication with Assistance. Outcome: Progressing   Problem: RH BLADDER ELIMINATION Goal: RH STG MANAGE BLADDER WITH ASSISTANCE Description: STG Manage Bladder With Assistance Outcome: Progressing Goal: RH STG MANAGE BLADDER WITH MEDICATION WITH ASSISTANCE Description: STG Manage Bladder With Medication With Assistance. Outcome: Progressing Goal: RH STG MANAGE BLADDER WITH EQUIPMENT WITH ASSISTANCE Description: STG Manage Bladder With Equipment With Assistance Outcome: Progressing   Problem: RH SKIN INTEGRITY Goal: RH STG SKIN FREE OF INFECTION/BREAKDOWN Outcome: Progressing Goal: RH STG MAINTAIN SKIN INTEGRITY WITH ASSISTANCE Description: STG Maintain Skin Integrity With Assistance. Outcome: Progressing   Problem: RH SAFETY Goal: RH STG ADHERE TO SAFETY PRECAUTIONS W/ASSISTANCE/DEVICE Description: STG Adhere to Safety Precautions With Assistance/Device. Outcome: Progressing Goal: RH STG DECREASED RISK OF FALL WITH ASSISTANCE Description: STG Decreased Risk of Fall With Assistance. Outcome: Progressing   Problem: RH COGNITION-NURSING Goal: RH STG USES MEMORY AIDS/STRATEGIES W/ASSIST  TO PROBLEM SOLVE Description: STG Uses Memory Aids/Strategies With Assistance to Problem Solve. Outcome: Progressing Goal: RH STG ANTICIPATES NEEDS/CALLS FOR ASSIST W/ASSIST/CUES Description: STG Anticipates Needs/Calls for Assist With Assistance/Cues. Outcome: Progressing   Problem: RH PAIN MANAGEMENT Goal: RH STG PAIN MANAGED AT OR BELOW PT'S PAIN GOAL Outcome: Progressing   Problem: RH KNOWLEDGE DEFICIT BRAIN INJURY Goal: RH STG INCREASE KNOWLEDGE OF SELF CARE AFTER BRAIN INJURY Outcome: Progressing Goal: RH STG INCREASE KNOWLEDGE OF DYSPHAGIA/FLUID INTAKE Outcome: Progressing   

## 2018-08-05 NOTE — Progress Notes (Signed)
Speech Language Pathology Daily Session Note  Patient Details  Name: JAVALE ATHANS MRN: [id number] Date of Birth: October 05, 1950  Today's Date: 08/05/2018 SLP Individual Time: 1300-1400 Total Time: 60 minutes  Short Term Goals: Week 1: SLP Short Term Goal 1 (Week 1): Pt will initiate functional task in 8 out of 10 opportunities with Min A cues.  SLP Short Term Goal 2 (Week 1): Pt will demonstrate selective attention in mildly distracting environment for ~ 30 minutes with Min A cues.  SLP Short Term Goal 3 (Week 1): Pt will solve semi-complex problem solving tasks with Min A cues.  SLP Short Term Goal 4 (Week 1): Pt will demonstrate intellectual awareness by listing 3 physical and cognitive deficits related to acute condition with Min A cues.  SLP Short Term Goal 5 (Week 1): Pt will follow semi-complex 2 step directions in 7 out of 10 opportunities with Min A cues.   Skilled Therapeutic Interventions: Pt was seen for skilled ST intervention targeting goals for improved cognition. SLP facilitated session by initiating the Scales of Cognitive Ability for Traumatic Brain Injury (SCATBI). Pt had significant difficulty with the perception/discrimination and Organization subtests, with severe impairment noted. Specifically, pt struggled with word recognition, discrimination of color/shape/size, and auditory discrimination tasks. Pt also exhibited difficulty with identifying pictured categories and category members, word discrimination/assocation and sequencing (words and events). Pt was noted to produce jargon during word association task, but was able to name objects and answer orientation questions with minimal difficulty. Pt was left in chair with alarm on, all needs within reach. Continue ST per current plan of care.  Pain Pain Assessment Pain Scale: Faces Pain Score: 0-No pain Faces Pain Scale: Hurts a little bit Pain Descriptors / Indicators: Discomfort;Grimacing  Therapy/Group: Individual  Therapy  Lorilee Cafarella B. Murvin Natal, [facility], CCC-SLP Speech Language Pathologist  Leigh Aurora 08/05/2018, 2:41 PM

## 2018-08-05 NOTE — Progress Notes (Signed)
Physical Therapy Note  Patient Details  Name: LAKIM LEWANDOSKI MRN: [id number] Date of Birth: 1950/11/18 Today's Date: 08/05/2018    Attempted to see pt for makeup time from previous date. Pt received sleeping soundly in bed, barely opening eyes when PT spoke to him. pt left in bed. Will f/u per POC.   Sandi Mariscal 08/05/2018, 3:18 PM

## 2018-08-05 NOTE — Progress Notes (Signed)
Occupational Therapy Session Note  Patient Details  Name: THEARTIS SWEEZEY MRN: [id number] Date of Birth: 05/05/1950  Today's Date: 08/05/2018 OT Individual Time: 1034-1130 OT Individual Time Calculation (min): 56 min    Short Term Goals: Week 1:  OT Short Term Goal 1 (Week 1): Continue to assess vision  OT Short Term Goal 2 (Week 1): Pt will pick out clothing with mod questining cues  OT Short Term Goal 3 (Week 1): Pt will perform bed mobility to come to EOB in prep for ADL tasks with contact guard OT Short Term Goal 4 (Week 1): Pt will perform toileting tasks with mod A sit to stands OT Short Term Goal 5 (Week 1): Pt will perform 2 grooming tasks in standing to improve standing endurance  Skilled Therapeutic Interventions/Progress Updates:    Pt worked on shower and dressing during session.  Mod assist with mod demonstrational cueing for ambulation to the shower seat from the wheelchair at bedside with use of the RW for support.  He needed assist to stay close to the walker as well as for advancing it secondary to decreased initiation.  Max instructional cueing with mod assist overall for all bathing sit to stand.  He needed max assist for drying off secondary to not initiating the task to several verbal commands.  Mod assist to transfer over to the wheelchair stand pivot.  He needed max assist for all dressing sit to stand with mod assist for standing balance when attempting to pull brief and pants over his hips.  Finished session with pt in the wheelchair with call button and phone in reach and safety alarm belt in place.  Pt more alert this session compared to yesterday but not oriented to place, time, or situation.  On occasion he would verbally say a few words or start a response, but he could not finish it.    Therapy Documentation Precautions:  Precautions Precautions: Fall Restrictions Weight Bearing Restrictions: No  Pain: Pain Assessment Pain Scale: Faces Pain Score: 0-No  pain Faces Pain Scale: No hurt ADL: See Care Tool Section for some details of ADL  Therapy/Group: Individual Therapy  Chaynce Schafer OTR/L 08/05/2018, 11:51 AM

## 2018-08-05 NOTE — Progress Notes (Signed)
Physical Therapy Session Note  Patient Details  Name: [name] Frank MRN: [id number] Date of Birth: 02-23-1951  Today's Date: 08/05/2018 PT Individual Time: 0802-0915 PT Individual Time Calculation (min): 73 min   Short Term Goals: Week 1:  PT Short Term Goal 1 (Week 1): Pt will ambulate 25' w/ min assist using LRAD PT Short Term Goal 2 (Week 1): Pt will perform bed <>chair transfers w/ min assist PT Short Term Goal 3 (Week 1): Pt will demonstrate normal postural control during functional movements w/ min cues  PT Short Term Goal 4 (Week 1): Pt will follow 2-step commands w/o cues consistently during functional mobility   Skilled Therapeutic Interventions/Progress Updates:    Pt supine in bed with RN present upon PT arrival, pt agreeable to therapy tx and does not report pain. Pt appears more alert today compared to yesterday, opens eyes more frequently and responding to 1 step commands, verbalizes single words. Pt transferred to sitting EOB with mod assist. Pt seated EOB donned shorts and performed sit<>stand with RW and mod assist to pull shorts over hips. Pt performed stand pivot with RW and min assist from bed>w/c. Pt encouraged to eat some breakfast, working on OOB tolerance and attention. Pt ate a banana and had a few sips of diet coke, pt shakes his head no when asked about eating the rest of his breakfast. Pt transported to the gym. Pt performed stand pivot to the mat with RW and min assist, increased time to complete secondary to slow initiation and decreased motor planning. Pt worked on standing balance while performing horseshoe toss activity with single UE support on RW, min assist for balance and pt with posterior lean at times requiring tactile cues to correct. Pt reports having to use the bathroom. Stand pivot to w/c with RW and min assist. Pt transported back to room. Pt performed sit<>stands from w/c with RW and mod assist, ambulated from room<>bathroom 2 x 5 ft this session with RW  and min assist, decreased step length and gait speed. Pt incontinent and continent of bowels, assist for clothing management and pericare. Pt left seated in w/c at end of session with needs in reach and chair alarm set.   Therapy Documentation Precautions:  Precautions Precautions: Fall Restrictions Weight Bearing Restrictions: No   Therapy/Group: Individual Therapy  Cresenciano Genre, PT, DPT 08/05/2018, 7:58 AM

## 2018-08-05 NOTE — Progress Notes (Signed)
[facility] PHYSICAL MEDICINE & REHABILITATION PROGRESS NOTE   Subjective/Complaints:  Pt cont to be lethargic although this has been the case since rehab admit.  No observed seizures Review of systems with yes nose able to deny chest pain shortness of breath nausea vomiting diarrhea or constipation  Objective:   Ct Head Wo Contrast  Result Date: 08/04/2018 CLINICAL DATA:  Decreased alertness, evaluate for hydrocephalus EXAM: CT HEAD WITHOUT CONTRAST TECHNIQUE: Contiguous axial images were obtained from the base of the skull through the vertex without intravenous contrast. COMPARISON:  CT brain, 07/29/2018 FINDINGS: Brain: Interval decrease in attenuation of a heterogeneous left hemispheric collection, which remains unchanged in size at approximately 9 mm in maximum coronal thickness. There is mild mass effect on the left hemisphere without significant midline shift and no change in caliber or configuration of the ventricles. Minimal dependent blood product in the temporal horn of the right lateral ventricle. Periventricular white matter hypodensity. Vascular: No hyperdense vessel or unexpected calcification. Skull: Normal. Negative for fracture or focal lesion. Sinuses/Orbits: No acute finding. Other: None. IMPRESSION: 1. Interval decrease in attenuation of a heterogeneous left hemispheric collection, which remains unchanged in size at approximately 9 mm in maximum coronal thickness. There is mild mass effect on the left hemisphere without significant midline shift. 2. Unchanged prominence of the lateral and third ventricles. Minimal, unchanged dependent blood product in the occipital horn of the right lateral ventricle. 3. Periventricular white matter hypodensity, likely small-vessel white matter disease. Electronically Signed   By: Lauralyn PrimesAlex  Bibbey M.D.   On: 08/04/2018 12:14   Recent Labs    08/04/18 0618 08/05/18 0522  WBC 3.4* 3.6*  HGB 13.2 13.0  HCT 40.0 39.1  PLT 199 199   Recent Labs   08/04/18 0618  NA 136  K 3.9  CL 99  CO2 23  GLUCOSE 189*  BUN 14  CREATININE 0.64  CALCIUM 8.7*    Intake/Output Summary (Last 24 hours) at 08/05/2018 0732 Last data filed at 08/05/2018 0300 Gross per 24 hour  Intake 518.72 ml  Output 960 ml  Net -441.28 ml     Physical Exam: Vital Signs Blood pressure 122/60, pulse (!) 50, temperature 98.5 F (36.9 C), temperature source Oral, resp. rate 17, weight 95.1 kg, SpO2 98 %.   General: No acute distress Scalp: Staples in place right occiput skin well-healed Mood and affect are appropriate Heart: Regular rate and rhythm no rubs murmurs or extra sounds Lungs: Clear to auscultation, breathing unlabored, no rales or wheezes Abdomen: Positive bowel sounds, soft nontender to palpation, nondistended Extremities: No clubbing, cyanosis, or edema Skin: No evidence of breakdown, no evidence of rash Neurologic: Oriented to person and place but not time, motor strength is 4/5 in bilateral deltoid, bicep, tricep, grip, hip flexor, knee extensors, ankle dorsiflexor and plantar flexor, Sensory exam normal sensation to light touch and proprioception in bilateral upper and lower extremities Cerebellar exam mild dysmetria in right upper extremity finger-nose-finger Musculoskeletal: Full range of motion in all 4 extremities. No joint swelling Patient oriented to person and place but not time  Assessment/Plan: 1. Functional deficits secondary to left subdural hematoma, subarachnoid hemorrhage following a fall at home which require 3+ hours per day of interdisciplinary therapy in a comprehensive inpatient rehab setting.  Physiatrist is providing close team supervision and 24 hour management of active medical problems listed below.  Physiatrist and rehab team continue to assess barriers to discharge/monitor patient progress toward functional and medical goals  Care Tool:  Bathing  Bathing activity did not occur: Refused Body parts bathed by  patient: Face   Body parts bathed by helper: Buttocks, Front perineal area, Abdomen, Chest, Left arm, Right arm, Right upper leg, Left upper leg, Right lower leg, Left lower leg     Bathing assist Assist Level: Total Assistance - Patient < 25%     Upper Body Dressing/Undressing Upper body dressing   What is the patient wearing?: Hospital gown only    Upper body assist Assist Level: Total Assistance - Patient < 25%    Lower Body Dressing/Undressing Lower body dressing      What is the patient wearing?: Incontinence brief     Lower body assist Assist for lower body dressing: Total Assistance - Patient < 25%     Toileting Toileting    Toileting assist Assist for toileting: Moderate Assistance - Patient 50 - 74%     Transfers Chair/bed transfer  Transfers assist     Chair/bed transfer assist level: Moderate Assistance - Patient 50 - 74%     Locomotion Ambulation   Ambulation assist      Assist level: Moderate Assistance - Patient 50 - 74% Assistive device: Hand held assist Max distance: 25'   Walk 10 feet activity   Assist     Assist level: Moderate Assistance - Patient - 50 - 74% Assistive device: Hand held assist   Walk 50 feet activity   Assist Walk 50 feet with 2 turns activity did not occur: Safety/medical concerns         Walk 150 feet activity   Assist Walk 150 feet activity did not occur: Safety/medical concerns         Walk 10 feet on uneven surface  activity   Assist Walk 10 feet on uneven surfaces activity did not occur: Safety/medical concerns         Wheelchair     Assist Will patient use wheelchair at discharge?: No   Wheelchair activity did not occur: N/A         Wheelchair 50 feet with 2 turns activity    Assist    Wheelchair 50 feet with 2 turns activity did not occur: N/A       Wheelchair 150 feet activity     Assist Wheelchair 150 feet activity did not occur: N/A          Medical  Problem List and Plan: 1.Functional declinesecondary toAMS, 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 Exhibits signs of aphasia C I R PT OT speech  2. Antithrombotics: -DVT/anticoagulation:Mechanical:Sequential compression devices, below kneeBilateral lower extremities -antiplatelet therapy: N/A 3. Pain Management:Tylenol prn. 4. Mood:LCSW to follow for evaluation and support. -antipsychotic agents: N/A 5. Neuropsych: This patientis notcapable of making decisions on hisown behalf. 6. Skin/Wound Care:routine pressure relief measures. Occipital wound staples may be removed 7. Fluids/Electrolytes/Nutrition:Monitor I/O. Check lytes in am. 8. T2DM: Monitor BS ac/hs. Continue to hold glucotrol for now and use SSI for elevated BS.Adjust regimen as needed. 9. CAD/PAF/A flutter: Off Plavix due to SDH. Monitor HR bid. Continue Cardizem CD and Toprol XL. Will order orthostatic vitals due to reports of dizziness. Order TEDs. Continue to hold Losartan.Pt with regular rhythm at present Vitals:   08/04/18 2029 08/05/18 0442  BP: (!) 146/68 122/60  Pulse: (!) 54 (!) 50  Resp: 18 17  Temp: 97.7 F (36.5 C) 98.5 F (36.9 C)  SpO2: 98% 98%  Pressure controlled 10. Left > Right hydrocele: On Augmentin D#  5, afebrile 11. AKI with hyponateremia: Has resolved with hydration. Recheck on Monday.  12. New onset seizures: Continue Keppra 1000 mg bid.  No seizure activity noted since admission to rehabilitation but has been lethargic, would trial ritalin to activate 13.  Urinary retention will start Flomax monitor for orthostatic hypotension LOS: 4 days A FACE TO FACE EVALUATION WAS PERFORMED  Erick Colace 08/05/2018, 7:32 AM

## 2018-08-06 ENCOUNTER — Inpatient Hospital Stay (HOSPITAL_COMMUNITY): Payer: Medicare HMO

## 2018-08-06 ENCOUNTER — Inpatient Hospital Stay (HOSPITAL_COMMUNITY): Payer: Medicare HMO | Admitting: Speech Pathology

## 2018-08-06 ENCOUNTER — Encounter (HOSPITAL_COMMUNITY): Payer: Self-pay | Admitting: *Deleted

## 2018-08-06 ENCOUNTER — Inpatient Hospital Stay (HOSPITAL_COMMUNITY): Payer: Medicare HMO | Admitting: Occupational Therapy

## 2018-08-06 LAB — GLUCOSE, CAPILLARY
Glucose-Capillary: 161 mg/dL — ABNORMAL HIGH (ref 70–99)
Glucose-Capillary: 226 mg/dL — ABNORMAL HIGH (ref 70–99)
Glucose-Capillary: 198 mg/dL — ABNORMAL HIGH (ref 70–99)
Glucose-Capillary: 220 mg/dL — ABNORMAL HIGH (ref 70–99)

## 2018-08-06 MED ORDER — LEVETIRACETAM 750 MG PO TABS
750.0000 mg | ORAL_TABLET | Freq: Two times a day (BID) | ORAL | Status: DC
  Administered 2018-08-06 – 2018-08-07 (×2): 750 mg via ORAL
  Filled 2018-08-06 (×3): qty 1

## 2018-08-06 MED ORDER — TAMSULOSIN HCL 0.4 MG PO CAPS
0.4000 mg | ORAL_CAPSULE | Freq: Every day | ORAL | Status: DC
  Administered 2018-08-06 – 2018-08-21 (×16): 0.4 mg via ORAL
  Filled 2018-08-06 (×16): qty 1

## 2018-08-06 MED ORDER — DILTIAZEM HCL ER COATED BEADS 300 MG PO CP24
300.0000 mg | ORAL_CAPSULE | Freq: Every day | ORAL | Status: DC
  Administered 2018-08-07 – 2018-08-13 (×6): 300 mg via ORAL
  Filled 2018-08-06 (×7): qty 1

## 2018-08-06 NOTE — Progress Notes (Signed)
Occupational Therapy Session Note  Patient Details  Name: [name] Frank MRN: [id number] Date of Birth: 1950-04-04  Today's Date: 08/06/2018 OT Individual Time: 0902-1000 OT Individual Time Calculation (min): 58 min    Short Term Goals: Week 1:  OT Short Term Goal 1 (Week 1): Continue to assess vision  OT Short Term Goal 2 (Week 1): Pt will pick out clothing with mod questining cues  OT Short Term Goal 3 (Week 1): Pt will perform bed mobility to come to EOB in prep for ADL tasks with contact guard OT Short Term Goal 4 (Week 1): Pt will perform toileting tasks with mod A sit to stands OT Short Term Goal 5 (Week 1): Pt will perform 2 grooming tasks in standing to improve standing endurance  Skilled Therapeutic Interventions/Progress Updates:    Pt seen in bed at start of session.  He was able to state place, time, and situation with mod questioning cueing.  Delays with verbal responses as well as pt at times not being able to complete a statement after starting it.  He needed total assist for supine to sit EOB.  Once sitting, he was able to maintain his balance with min guard assist and complete transfer stand pivot to the wheelchair without assistive device.  Had pt work on some bathing and dressing at the sink.  Mod instructional cueing for sequencing for UB bathing as he would perseverate on washing his face.  Max assist for donning button up shirt as well also limited by IV being hooked up.  He was able to complete sit to stand at the sink with mod assist but demonstrated decreased forward trunk flexion when pushing up from the wheelchair.  His spouse called during session as well and he demonstrated decreased focused attention with her conversation.  Therapist educated her on pt's current level and that his attention is limiting his ability to understand and respond appropriately to her during conversation.  She was thankful and appreciative for the information.  Finished session with max assist  needed for thorough washing of his buttocks as well as donning new brief and pants.  Pt finished session working on grooming task of combing his hair with increased time and min assist.  Pt left in the wheelchair with call button and phone in reach and chair alarm in place.    Therapy Documentation Precautions:  Precautions Precautions: Fall Restrictions Weight Bearing Restrictions: No  Pain: Pain Assessment Pain Scale: Faces Pain Score: 0-No pain ADL: See Care Tool Section for some details of ADL  Therapy/Group: Individual Therapy  Kiyomi Pallo OTR/L 08/06/2018, 10:41 AM

## 2018-08-06 NOTE — Progress Notes (Signed)
Social Work Patient ID: [name] Frank, male   DOB: 07-27-1950, 68 y.o.   MRN: [id number] Spoke with wife and her friend-Jamie via telephone to discuss team conference goals supervision level and doing better today less lethargic. Pt does sleep much of the day and night when not in therapies. Target discharge date 6/6. Wife continues to be concerned about needing POA to pay their bills. Pt is not able to sign forms per MD, needs to improve more before he feels this can be done. Wife will need mutliple days of education due to does not seem to understand his deficits and care needs. Work toward discharge 6/6.

## 2018-08-06 NOTE — Progress Notes (Signed)
Physical Therapy Session Note  Patient Details  Name: [name] Frank MRN: [id number] Date of Birth: 27-Aug-1950  Today's Date: 08/06/2018 PT Individual Time: 0814-4818 and 5631-4970 PT Individual Time Calculation (min): 43 min and 60 min  Short Term Goals: Week 1:  PT Short Term Goal 1 (Week 1): Pt will ambulate 25' w/ min assist using LRAD PT Short Term Goal 2 (Week 1): Pt will perform bed <>chair transfers w/ min assist PT Short Term Goal 3 (Week 1): Pt will demonstrate normal postural control during functional movements w/ min cues  PT Short Term Goal 4 (Week 1): Pt will follow 2-step commands w/o cues consistently during functional mobility   Skilled Therapeutic Interventions/Progress Updates:   Session 1:  Pt seated in w/c upon PT arrival, agreeable to therapy tx and denies pain. Pt transported to the gym in w/c. Pt ambulated x 10 ft to the mat with RW and min assist. Pt then worked on gait training with RW to ambulate 2 x 25 ft with min assist, cues for increased step length and increased trunk extension, pt with decreased gait speed. Pt worked on standing balance this session without UE support to perform horseshoe toss activity x 2 trials with min assist. Pt worked on cognition and attention while performing standing balance activity to match cards, CGA for balance. Pt transported back to room and left seated in w/c with needs in reach and chair alarm set.   Session 2: Pt supine asleep upon PT arrival, pt awakens to verbal stimuli. Pt opens eyes, increased time to respond and prefers to nod head rather than answer verbally. Pt transferred to sitting EOB with mod assist, initially strong posterior lean in sitting with mod assist to correct. When asked if needing to go to the bathroom, pt nods his head yes. Pt ambulated from bed>bathroom this session with RW and min assist, cues for increased step length. Min assist for clothing management. Pt frustrated because he is unable to void. Pt  ambulated to w/c x 5 ft with RW and min assist. Pt very slow to respond to therapists questions, if he does respond he only replies with one word or nods. Working on attention and communication, therapist engaged patient in conversation about his family and career, pt begins to use more phrases/short sentences, still very slow to respond. Pt transported to the gym in w/c. Pt ambulated x 20 ft to the mat with RW and min assist, short step length and decreased gait speed with cues to correct. Pt worked on cognitive remediation this session and sustained attention in order to participate in peg board puzzle task, pt completed simple red/blue design with max cues throughout task. Pt worked on seated balance and anterior weightshifting while performing ball toss activity with supervision. Pt worked on dynamic standing balance while performing overhead reaching task for clothespins in order to increase trunk extension, during this pt with total LOB L laterally and posteriorly back onto the mat. Pt transferred to w/c with min assist and RW, transported back to room and left with needs in reach and chair alarm set.   Therapy Documentation Precautions:  Precautions Precautions: Fall Restrictions Weight Bearing Restrictions: No    Therapy/Group: Individual Therapy  Cresenciano Genre, PT, DPT 08/06/2018, 7:54 AM

## 2018-08-06 NOTE — Patient Care Conference (Signed)
Inpatient RehabilitationTeam Conference and Plan of Care Update Date: 08/06/2018   Time: 11:20 AM    Patient Name: [name] HeritageDonald G Savino      Medical Record Number: [id number]  Date of Birth: May 18, 1950 Sex: Male         Room/Bed: 4W12C/4W12C-01 Payor Info: Payor: [employer] MEDICARE / Plan: [employer] MEDICARE HMO / Product Type: *No Product type* /    Admitting Diagnosis: CVA 2 Team  Closed TBI; 12-14days  Admit Date/Time:  08/01/2018  8:00 PM Admission Comments: No comment available   Primary Diagnosis:  <principal problem not specified> Principal Problem: <principal problem not specified>  Patient Active Problem List   Diagnosis Date Noted  . Leukopenia   . Hypoalbuminemia due to protein-calorie malnutrition (HCC)   . Urinary retention   . Hydrocele   . Diabetes mellitus type 2 in obese (HCC)   . Somnolence   . SDH (subdural hematoma) (HCC) 08/01/2018  . Hydrocephalus in adult [facility](HCC) 07/29/2018  . Hydrocephalus (HCC) 07/28/2018  . Mild renal insufficiency 07/28/2018  . Paroxysmal atrial fibrillation (HCC) 07/28/2018  . Subdural hematoma (HCC) 07/14/2018  . Hypertension, essential 01/16/2018  . Controlled type 2 diabetes mellitus without complication, without long-term current use of insulin (HCC) 01/16/2018  . Coronary artery disease involving native heart without angina pectoris 01/16/2018  . Diabetes mellitus type II, non insulin dependent (HCC) 01/13/2011    Expected Discharge Date: Expected Discharge Date: 08/16/18  Team Members Present: Physician leading conference: Dr. Claudette LawsAndrew Kirsteins Social Worker Present: Dossie DerBecky Helaman Mecca, LCSW Nurse Present: Ronny BaconWhitney Reardon, RN PT Present: Woodfin GanjaEmily Van Shagen, PT OT Present: Perrin MalteseJames McGuire, OT SLP Present: Reuel DerbyHappi Overton, SLP PPS Coordinator present : Edson SnowballBecky Windsor, PT     Current Status/Progress Goal Weekly Team Focus  Medical   has been lethargic, I/O cath Inc bowel , no observed seizure activity but had keppra increased for suspected seizure  activity   maintain  med stability, reduce fall risk   reduce keppra , start ritalin, sleep graph   Bowel/Bladder   incont of urine but retaining urine scanned and I & O cath at 0230 for 500 yellow urine with small clots noted. Cont of bowel LBM 5/25  Encouraged timed toileting  Assist with toileting needs prn.   Swallow/Nutrition/ Hydration             ADL's   Mod assist for UB bathing with max assist for LB bathing and dressing.  Max assist for UB dressing.  Decreased focused and sustained attention,  mod assist for transfers with use of the RW,  Decreased awareness and initiation is also present.  supervision overall  selfcare retraining, balance retraining, transfer training, cognitive retraining, pt/family education   Mobility   can be up to mod-max assist with increased lethargy, otherwise mod assist for sit<>stands and min assist for transfers/short distance gait with RW  supervision  attention, OOB activity, balance, strength, gait training   Communication             Safety/Cognition/ Behavioral Observations  Mod A to Max A for sustained attention, semi-complex problem solving, processing times  Min A   task initiation, delayed processing, sustained attention, semi-complex problem solving   Pain   Denies c/o pain  Remain pain free  Assess for pain QShift and PRN   Skin   Scrotum is swollen and red (size of a softball)  Treat skin issues as per orders  Assess skin issues QShift and PRN.      *See Care Plan and  progress notes for long and short-term goals.     Barriers to Discharge  Current Status/Progress Possible Resolutions Date Resolved   Physician    Medical stability;Incontinence     slow progress   cont rehab      Nursing                  PT  Medical stability;Behavior                 OT                  SLP                SW                Discharge Planning/Teaching Needs:  Home with wife who wants to make sure this time he goes home he is ready, many  concerns scrotum, lethargy, etc.       Team Discussion:  Goals supervision with cues. Currently min-mod level of assist. Less lethargic but still sleeping in between therapies and all night. Started on ritalin for alertness. Not voiding nursing having to I & O cath. BS up and down. Speech delayed and slow to response. Ques if seizure meds dosage too high-MD to address. Scrotum swollen better. Wife will need education prior to DC home. Still asking about POA  Revisions to Treatment Plan:  DC 6/6    Continued Need for Acute Rehabilitation Level of Care: The patient requires daily medical management by a physician with specialized training in physical medicine and rehabilitation for the following conditions: Daily direction of a multidisciplinary physical rehabilitation program to ensure safe treatment while eliciting the highest outcome that is of practical value to the patient.: Yes Daily medical management of patient stability for increased activity during participation in an intensive rehabilitation regime.: Yes Daily analysis of laboratory values and/or radiology reports with any subsequent need for medication adjustment of medical intervention for : Neurological problems;Mood/behavior problems;Nutritional problems   I attest that I was present, lead the team conference, and concur with the assessment and plan of the team. Teleconference held due to COVID 19   Jaiveer Panas, Lemar Livings 08/06/2018, 1:23 PM

## 2018-08-06 NOTE — Progress Notes (Signed)
[name] Frank PHYSICAL MEDICINE & REHABILITATION PROGRESS NOTE   Subjective/Complaints:  Alert this am able to answer simple questions but often reverts to thumbs up or down rather than verbal response  Review of systems with yes nose able to deny chest pain shortness of breath nausea vomiting diarrhea or constipation  Objective:   Ct Head Wo Contrast  Result Date: 08/04/2018 CLINICAL DATA:  Decreased alertness, evaluate for hydrocephalus EXAM: CT HEAD WITHOUT CONTRAST TECHNIQUE: Contiguous axial images were obtained from the base of the skull through the vertex without intravenous contrast. COMPARISON:  CT brain, 07/29/2018 FINDINGS: Brain: Interval decrease in attenuation of a heterogeneous left hemispheric collection, which remains unchanged in size at approximately 9 mm in maximum coronal thickness. There is mild mass effect on the left hemisphere without significant midline shift and no change in caliber or configuration of the ventricles. Minimal dependent blood product in the temporal horn of the right lateral ventricle. Periventricular white matter hypodensity. Vascular: No hyperdense vessel or unexpected calcification. Skull: Normal. Negative for fracture or focal lesion. Sinuses/Orbits: No acute finding. Other: None. IMPRESSION: 1. Interval decrease in attenuation of a heterogeneous left hemispheric collection, which remains unchanged in size at approximately 9 mm in maximum coronal thickness. There is mild mass effect on the left hemisphere without significant midline shift. 2. Unchanged prominence of the lateral and third ventricles. Minimal, unchanged dependent blood product in the occipital horn of the right lateral ventricle. 3. Periventricular white matter hypodensity, likely small-vessel white matter disease. Electronically Signed   By: Eddie Candle M.D.   On: 08/04/2018 12:14   Recent Labs    08/04/18 0618 08/05/18 0522  WBC 3.4* 3.6*  HGB 13.2 13.0  HCT 40.0 39.1  PLT 199 199    Recent Labs    08/04/18 0618  NA 136  K 3.9  CL 99  CO2 23  GLUCOSE 189*  BUN 14  CREATININE 0.64  CALCIUM 8.7*    Intake/Output Summary (Last 24 hours) at 08/06/2018 0802 Last data filed at 08/06/2018 0400 Gross per 24 hour  Intake 1056.18 ml  Output 1100 ml  Net -43.82 ml     Physical Exam: Vital Signs Blood pressure 126/67, pulse (!) 49, temperature 98 F (36.7 C), resp. rate 18, weight 95.1 kg, SpO2 99 %.   General: No acute distress Scalp: Staples in place right occiput skin well-healed Mood and affect are appropriate Heart: Regular rate and rhythm no rubs murmurs or extra sounds Lungs: Clear to auscultation, breathing unlabored, no rales or wheezes Abdomen: Positive bowel sounds, soft nontender to palpation, nondistended Extremities: No clubbing, cyanosis, or edema Skin: No evidence of breakdown, no evidence of rash Neurologic: Oriented to person and place but not time, motor strength is 4/5 in bilateral deltoid, bicep, tricep, grip, hip flexor, knee extensors, ankle dorsiflexor and plantar flexor, Sensory exam normal sensation to light touch and proprioception in bilateral upper and lower extremities Cerebellar exam mild dysmetria in right upper extremity finger-nose-finger Musculoskeletal: Full range of motion in all 4 extremities. No joint swelling Patient oriented to person and place but not time  Assessment/Plan: 1. Functional deficits secondary to left subdural hematoma, subarachnoid hemorrhage following a fall at home which require 3+ hours per day of interdisciplinary therapy in a comprehensive inpatient rehab setting.  Physiatrist is providing close team supervision and 24 hour management of active medical problems listed below.  Physiatrist and rehab team continue to assess barriers to discharge/monitor patient progress toward functional and medical goals  Care Tool:  Bathing  Bathing activity did not occur: Refused Body parts bathed by patient:  Right arm, Chest, Abdomen, Front perineal area, Face, Right upper leg, Left upper leg   Body parts bathed by helper: Right lower leg, Left lower leg, Buttocks, Left arm     Bathing assist Assist Level: Maximal Assistance - Patient 24 - 49%     Upper Body Dressing/Undressing Upper body dressing   What is the patient wearing?: Button up shirt    Upper body assist Assist Level: Maximal Assistance - Patient 25 - 49%    Lower Body Dressing/Undressing Lower body dressing      What is the patient wearing?: Incontinence brief, Pants     Lower body assist Assist for lower body dressing: Maximal Assistance - Patient 25 - 49%     Toileting Toileting    Toileting assist Assist for toileting: Moderate Assistance - Patient 50 - 74%     Transfers Chair/bed transfer  Transfers assist     Chair/bed transfer assist level: Minimal Assistance - Patient > 75%     Locomotion Ambulation   Ambulation assist      Assist level: Moderate Assistance - Patient 50 - 74% Assistive device: Walker-rolling Max distance: 10'   Walk 10 feet activity   Assist     Assist level: Minimal Assistance - Patient > 75% Assistive device: Walker-rolling   Walk 50 feet activity   Assist Walk 50 feet with 2 turns activity did not occur: Safety/medical concerns         Walk 150 feet activity   Assist Walk 150 feet activity did not occur: Safety/medical concerns         Walk 10 feet on uneven surface  activity   Assist Walk 10 feet on uneven surfaces activity did not occur: Safety/medical concerns         Wheelchair     Assist Will patient use wheelchair at discharge?: No   Wheelchair activity did not occur: N/A         Wheelchair 50 feet with 2 turns activity    Assist    Wheelchair 50 feet with 2 turns activity did not occur: N/A       Wheelchair 150 feet activity     Assist Wheelchair 150 feet activity did not occur: N/A          Medical  Problem List and Plan: 1.Functional declinesecondary toAMS, 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 Exhibits signs of aphasia C I R PT OT speech Team conference today please see physician documentation under team conference tab, met with team face-to-face to discuss problems,progress, and goals. Formulized individual treatment plan based on medical history, underlying problem and comorbidities. 2. Antithrombotics: -DVT/anticoagulation:Mechanical:Sequential compression devices, below kneeBilateral lower extremities -antiplatelet therapy: N/A 3. Pain Management:Tylenol prn. 4. Mood:LCSW to follow for evaluation and support. -antipsychotic agents: N/A 5. Neuropsych: This patientis notcapable of making decisions on hisown behalf. 6. Skin/Wound Care:routine pressure relief measures. 7. Fluids/Electrolytes/Nutrition:Monitor I/O. Check lytes in am. 8. T2DM: Monitor BS ac/hs. Continue to hold glucotrol for now and use SSI for elevated BS.Adjust regimen as needed. 9. CAD/PAF/A flutter: Off Plavix due to SDH. Monitor HR bid. Continue Cardizem CD and Toprol XL. Will order orthostatic vitals due to reports of dizziness. Order TEDs. Continue to hold Losartan.Pt with regular rhythm at present Vitals:   08/05/18 2104 08/06/18 0558  BP: 121/60 126/67  Pulse: (!) 49 (!) 49  Resp: 18 18  Temp:  98 F (36.7 C) 98 F (36.7 C)  SpO2: 97% 99%  HR on low side may back off rate control 10. Left > Right hydrocele: On Augmentin D# 6 afebrile 11. AKI with hyponateremia: Has resolved with hydration. Recheck on Monday.  12. New onset seizures: Continue Keppra 1000 mg bid.  No seizure activity noted since admission to rehabilitation but has been lethargic, would trial ritalin to activate, Keppra has been increased due to suspected seizure activity but no observe seizures, this may be contributing to  lethargy 13.  Urinary retention will start Flomax monitor for orthostatic hypotension LOS: 5 days A FACE TO FACE EVALUATION WAS PERFORMED  Charlett Blake 08/06/2018, 8:02 AM

## 2018-08-06 NOTE — Progress Notes (Signed)
Speech Language Pathology Daily Session Note  Patient Details  Name: [name] Frank MRN: [id number] Date of Birth: May 23, 1950  Today's Date: 08/06/2018 SLP Individual Time: 1120-1200 SLP Individual Time Calculation (min): 40 min  Short Term Goals: Week 1: SLP Short Term Goal 1 (Week 1): Pt will initiate functional task in 8 out of 10 opportunities with Min A cues.  SLP Short Term Goal 2 (Week 1): Pt will demonstrate selective attention in mildly distracting environment for ~ 30 minutes with Min A cues.  SLP Short Term Goal 3 (Week 1): Pt will solve semi-complex problem solving tasks with Min A cues.  SLP Short Term Goal 4 (Week 1): Pt will demonstrate intellectual awareness by listing 3 physical and cognitive deficits related to acute condition with Min A cues.  SLP Short Term Goal 5 (Week 1): Pt will follow semi-complex 2 step directions in 7 out of 10 opportunities with Min A cues.   Skilled Therapeutic Interventions: Skilled treatment session focused on cognitive goals. SLP facilitated session by continuing the Scales of Cognitive Ability for Traumatic Brain Injury (SCATBI). Patient scored severe impairments in organization and demonstrated severe difficulty with visual memory, moderate difficulty with naming and severe impairments in verbal fluency. Suspect function impacted by decreased sustained attention to tasks and poor initiation. Throughout session, patient unable to produce spontaneous speech and required overall Max A multimodal cues and extra time to verbalize. Of note, patient changed to full supervision with meals due to poor initiation with self-feeding. Patient left upright in wheelchair with alarm on and all needs within reach. Continue with current plan of care.      Pain Pain Assessment Pain Scale: Faces Pain Score: 0-No pain  Therapy/Group: Individual Therapy  Devann Cribb 08/06/2018, 12:42 PM

## 2018-08-07 ENCOUNTER — Inpatient Hospital Stay (HOSPITAL_COMMUNITY): Payer: Medicare HMO | Admitting: Occupational Therapy

## 2018-08-07 ENCOUNTER — Inpatient Hospital Stay (HOSPITAL_COMMUNITY): Payer: Medicare HMO | Admitting: Physical Therapy

## 2018-08-07 ENCOUNTER — Inpatient Hospital Stay (HOSPITAL_COMMUNITY): Payer: Medicare HMO | Admitting: Speech Pathology

## 2018-08-07 LAB — GLUCOSE, CAPILLARY
Glucose-Capillary: 193 mg/dL — ABNORMAL HIGH (ref 70–99)
Glucose-Capillary: 256 mg/dL — ABNORMAL HIGH (ref 70–99)
Glucose-Capillary: 177 mg/dL — ABNORMAL HIGH (ref 70–99)
Glucose-Capillary: 176 mg/dL — ABNORMAL HIGH (ref 70–99)

## 2018-08-07 MED ORDER — LEVETIRACETAM 100 MG/ML PO SOLN
750.0000 mg | Freq: Two times a day (BID) | ORAL | Status: DC
  Administered 2018-08-07 – 2018-08-20 (×26): 750 mg via ORAL
  Filled 2018-08-07 (×27): qty 10

## 2018-08-07 NOTE — Progress Notes (Signed)
[place] PHYSICAL MEDICINE & REHABILITATION PROGRESS NOTE   Subjective/Complaints:  Discussed efforts at improved level of alertness as well as fluid intake.    Review of systems with yes nose able to deny chest pain shortness of breath nausea vomiting diarrhea or constipation  Objective:   No results found. Recent Labs    08/05/18 0522  WBC 3.6*  HGB 13.0  HCT 39.1  PLT 199   No results for input(s): NA, K, CL, CO2, GLUCOSE, BUN, CREATININE, CALCIUM in the last 72 hours.  Intake/Output Summary (Last 24 hours) at 08/07/2018 0736 Last data filed at 08/06/2018 2238 Gross per 24 hour  Intake 430 ml  Output 1150 ml  Net -720 ml     Physical Exam: Vital Signs Blood pressure 131/63, pulse (!) 55, temperature 98.2 F (36.8 C), resp. rate 17, weight 95.1 kg, SpO2 98 %.   General: No acute distress Scalp: Staples in place right occiput skin well-healed Mood and affect are appropriate Heart: Regular rate and rhythm no rubs murmurs or extra sounds Lungs: Clear to auscultation, breathing unlabored, no rales or wheezes Abdomen: Positive bowel sounds, soft nontender to palpation, nondistended Extremities: No clubbing, cyanosis, or edema Skin: No evidence of breakdown, no evidence of rash Neurologic: Oriented to person and place but not time, motor strength is 4/5 in bilateral deltoid, bicep, tricep, grip, hip flexor, knee extensors, ankle dorsiflexor and plantar flexor, Sensory exam normal sensation to light touch and proprioception in bilateral upper and lower extremities Cerebellar exam mild dysmetria in right upper extremity finger-nose-finger Musculoskeletal: Full range of motion in all 4 extremities. No joint swelling Patient oriented to person and place but not time  Assessment/Plan: 1. Functional deficits secondary to left subdural hematoma, subarachnoid hemorrhage following a fall at home which require 3+ hours per day of interdisciplinary therapy in a comprehensive  inpatient rehab setting.  Physiatrist is providing close team supervision and 24 hour management of active medical problems listed below.  Physiatrist and rehab team continue to assess barriers to discharge/monitor patient progress toward functional and medical goals  Care Tool:  Bathing  Bathing activity did not occur: Refused Body parts bathed by patient: Right arm, Left arm, Chest, Abdomen, Front perineal area, Face   Body parts bathed by helper: Buttocks Body parts n/a: Right upper leg, Left upper leg, Right lower leg, Left lower leg   Bathing assist Assist Level: Moderate Assistance - Patient 50 - 74%     Upper Body Dressing/Undressing Upper body dressing   What is the patient wearing?: Button up shirt    Upper body assist Assist Level: Maximal Assistance - Patient 25 - 49%    Lower Body Dressing/Undressing Lower body dressing      What is the patient wearing?: Incontinence brief, Pants     Lower body assist Assist for lower body dressing: Maximal Assistance - Patient 25 - 49%     Toileting Toileting    Toileting assist Assist for toileting: Moderate Assistance - Patient 50 - 74%     Transfers Chair/bed transfer  Transfers assist     Chair/bed transfer assist level: Minimal Assistance - Patient > 75%     Locomotion Ambulation   Ambulation assist      Assist level: Minimal Assistance - Patient > 75% Assistive device: Walker-rolling Max distance: 25 ft   Walk 10 feet activity   Assist     Assist level: Minimal Assistance - Patient > 75% Assistive device: Walker-rolling   Walk 50 feet activity  Assist Walk 50 feet with 2 turns activity did not occur: Safety/medical concerns         Walk 150 feet activity   Assist Walk 150 feet activity did not occur: Safety/medical concerns         Walk 10 feet on uneven surface  activity   Assist Walk 10 feet on uneven surfaces activity did not occur: Safety/medical concerns          Wheelchair     Assist Will patient use wheelchair at discharge?: No   Wheelchair activity did not occur: N/A         Wheelchair 50 feet with 2 turns activity    Assist    Wheelchair 50 feet with 2 turns activity did not occur: N/A       Wheelchair 150 feet activity     Assist Wheelchair 150 feet activity did not occur: N/A          Medical Problem List and Plan: 1.Functional declinesecondary toAMS, 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 Exhibits signs of aphasia, poor activation trial ritalin  C I R PT OT speech  2. Antithrombotics: -DVT/anticoagulation:Mechanical:Sequential compression devices, below kneeBilateral lower extremities -antiplatelet therapy: N/A 3. Pain Management:Tylenol prn. 4. Mood:LCSW to follow for evaluation and support. -antipsychotic agents: N/A 5. Neuropsych: This patientis notcapable of making decisions on hisown behalf. 6. Skin/Wound Care:routine pressure relief measures. 7. Fluids/Electrolytes/Nutrition:Monitor I/O. I yesterday , meal intake 50-80% 8. T2DM: Monitor BS ac/hs. Continue to hold glucotrol for now and use SSI for elevated BS.Adjust regimen as needed. 9. CAD/PAF/A flutter: Off Plavix due to SDH. Monitor HR bid. Continue Cardizem CD and Toprol XL. Will order orthostatic vitals due to reports of dizziness. Order TEDs. Continue to hold Losartan.Pt with regular rhythm at present Vitals:   08/06/18 1937 08/07/18 0623  BP: 116/61 131/63  Pulse: (!) 50 (!) 55  Resp: 19 17  Temp: 97.9 F (36.6 C) 98.2 F (36.8 C)  SpO2: 95% 98%  reduced cardizem due to bradycardia and soft BP, goal is HR>50bpm  10. Left > Right hydrocele: On Augmentin D# 6 afebrile 11. AKI with hyponateremia: Has resolved with hydration.BMET in am  12. New onset seizures: Continue Keppra 750 mg bid.  No seizure activity noted since  admission to rehabilitation but has been lethargic, would trial ritalin to activate, Keppra has been increased due to suspected seizure activity but no observe seizures, this may be contributing to lethargy 13.  Urinary retention will start Flomax monitor for orthostatic hypotension LOS: 6 days A FACE TO FACE EVALUATION WAS PERFORMED  Erick Colace 08/07/2018, 7:36 AM

## 2018-08-07 NOTE — Progress Notes (Signed)
Speech Language Pathology Daily Session Note  Patient Details  Name: [name] Frank MRN: [id number] Date of Birth: Aug 09, 1950  Today's Date: 08/07/2018 SLP Individual Time: 1255-1335 SLP Individual Time Calculation (min): 40 min and Today's Date: 08/07/2018 SLP Missed Time: 20 Minutes Missed Time Reason: Patient fatigue  Short Term Goals: Week 1: SLP Short Term Goal 1 (Week 1): Pt will initiate functional task in 8 out of 10 opportunities with Min A cues.  SLP Short Term Goal 2 (Week 1): Pt will demonstrate selective attention in mildly distracting environment for ~ 30 minutes with Min A cues.  SLP Short Term Goal 3 (Week 1): Pt will solve semi-complex problem solving tasks with Min A cues.  SLP Short Term Goal 4 (Week 1): Pt will demonstrate intellectual awareness by listing 3 physical and cognitive deficits related to acute condition with Min A cues.  SLP Short Term Goal 5 (Week 1): Pt will follow semi-complex 2 step directions in 7 out of 10 opportunities with Min A cues.   Skilled Therapeutic Interventions: Skilled treatment session focused on cognitive goals. Upon arrival, patient was asleep in bed and slow to arouse. SLP facilitated session by providing extra time and overall Max A for patient to sit EOB. Patient was eventually transferred to the wheelchair with Max A +2 assist for safety in hopes of maximizing arousal and overall cognitive functioning. Patient's lunch present and he initiated removing to lid to tray with Mod A verbal cues but shook his head "no" when attempting to assist with self-feeding. SLP also facilitated session by providing Max A multimodal cues for patient to verbally answer any basic yes/no question or orientation question. Patient was nonverbal throughout session and utilized head nods or would hit his hand on the table. SLP also attempted to have the patient read something aloud in hopes of initiating verbalization without success. RN aware. Patient transferred back  to bed due to fatigue and missed remaining 20 minutes of session. Patient left with alarm on and all needs within reach. Continue with current plan of care.      Pain No/Denies Pain   Therapy/Group: Individual Therapy  [name] 08/07/2018, 2:42 PM

## 2018-08-07 NOTE — Progress Notes (Signed)
Occupational Therapy Session Note  Patient Details  Name: [name] Frank MRN: [id number] Date of Birth: 1950-05-16  Today's Date: 08/07/2018 OT Individual Time: 0905-1003 OT Individual Time Calculation (min): 58 min    Short Term Goals: Week 1:  OT Short Term Goal 1 (Week 1): Continue to assess vision  OT Short Term Goal 2 (Week 1): Pt will pick out clothing with mod questining cues  OT Short Term Goal 3 (Week 1): Pt will perform bed mobility to come to EOB in prep for ADL tasks with contact guard OT Short Term Goal 4 (Week 1): Pt will perform toileting tasks with mod A sit to stands OT Short Term Goal 5 (Week 1): Pt will perform 2 grooming tasks in standing to improve standing endurance  Skilled Therapeutic Interventions/Progress Updates:    Session 1:  (0905-1003) Pt completed transfer from supine to sit EOB with max assist.  He then ambulated to the bathroom with min assist and mod demonstrational cueing using the RW for showering on the shower seat.  Max instructional cueing to sequence shower as well as drying off.  He worked on dressing next with transfer to the wheelchair at min assist with use of the RW for support.  Mod assist for donning button up shirt as well as mod assist for donning pants.  Max assist for incontinence brief with total assist for socks and shoes.  Pt with decreased orientation this session with regards to day of the week and day of the month.  He was able to state place and month.  When asked questions regarding orientation, he was unable to recall the question asked when therapist would ask "Now what was the question I asked you?Marland Kitchen  He would respond "I don't know."  Finished session with call button and phone in reach and safety alarm belt in place.    Session 2:  (3419-3790)  Pt began session finishing selfcare tasks of oral hygiene.  He was able to setup the toothbrush and brush his teeth with increased time and supervision.  Min instructional cueing to utilize the  cup for rinsing his mouth out.  Once completed, he was taken to the therapy gym where he worked on cognitive task while incorporating standing balance.  He was able to stand for intervals of 3-4 mins with min assist while working on Doctor, general practice.  It took him approximately 5 mins and mod questioning cueing to replicate a 15 peg design.  Max assist for a second more complex design secondary to decreased focused attention.  He continued to try and place his elbow on the table or walker and then place his head in his hand while working.  Max cueing for re-direction to stand up straight.  Had him sit down secondary to fatigue at times where he continued to try and prop on the left hand.  Once he exhibited LOB posteriorly in sitting, requiring max assist to correct.  Finished session with transfer back to the wheelchair with min assist stand pivot and transfer back to the room.  Pt left up for lunch with call button and phone in reach and safety alarm belt in place.     Therapy Documentation Precautions:  Precautions Precautions: Fall Restrictions Weight Bearing Restrictions: No  Pain: Pain Assessment Pain Scale: Faces Pain Score: 0-No pain ADL: See Care Tool Section for some details of ADL  Therapy/Group: Individual Therapy  Kynzli Rease OTR/L 08/07/2018, 12:13 PM

## 2018-08-07 NOTE — Progress Notes (Signed)
Physical Therapy Session Note  Patient Details  Name: SABRE MORIOKA MRN: [id number] Date of Birth: 1950-09-14  Today's Date: 08/07/2018 PT Missed Time: 45 Minutes Missed Time Reason: Patient fatigue;Other (Comment)(lethargic)  Skilled Therapeutic Interventions/Progress Updates:    Missed 45 minutes of skilled therapy. Patient received asleep, supine in bed and wakening to moderate verbal stimulus; however, quick to return to sleep. Upon questioning pt nodded head "no" when asked to participate with therapy. Despite therapist providing encouragement pt continues to shake head "no" and when therapist asked if patient wanted to rest he gave a thumbs up. Pt left supine in bed with needs in reach and bed alarm on.  Ginny Forth, PT, DPT 08/07/2018, 12:22 PM

## 2018-08-08 ENCOUNTER — Inpatient Hospital Stay (HOSPITAL_COMMUNITY): Payer: Medicare HMO

## 2018-08-08 ENCOUNTER — Inpatient Hospital Stay (HOSPITAL_COMMUNITY): Payer: Medicare HMO | Admitting: Physical Therapy

## 2018-08-08 ENCOUNTER — Inpatient Hospital Stay (HOSPITAL_COMMUNITY): Payer: Medicare HMO | Admitting: Occupational Therapy

## 2018-08-08 LAB — CBC
HCT: 37.7 % — ABNORMAL LOW (ref 39.0–52.0)
Hemoglobin: 12.4 g/dL — ABNORMAL LOW (ref 13.0–17.0)
MCH: 29 pg (ref 26.0–34.0)
MCHC: 32.9 g/dL (ref 30.0–36.0)
MCV: 88.3 fL (ref 80.0–100.0)
Platelets: 154 10*3/uL (ref 150–400)
RBC: 4.27 MIL/uL (ref 4.22–5.81)
RDW: 14.3 % (ref 11.5–15.5)
WBC: 3.2 10*3/uL — ABNORMAL LOW (ref 4.0–10.5)
nRBC: 0 % (ref 0.0–0.2)

## 2018-08-08 LAB — BASIC METABOLIC PANEL
Anion gap: 12 (ref 5–15)
BUN: 18 mg/dL (ref 8–23)
CO2: 26 mmol/L (ref 22–32)
Calcium: 8.4 mg/dL — ABNORMAL LOW (ref 8.9–10.3)
Chloride: 101 mmol/L (ref 98–111)
Creatinine, Ser: 0.63 mg/dL (ref 0.61–1.24)
GFR calc Af Amer: >60 mL/min (ref >60)
GFR calc non Af Amer: >60 mL/min (ref >60)
Glucose, Bld: 197 mg/dL — ABNORMAL HIGH (ref 70–99)
Potassium: 3.5 mmol/L (ref 3.5–5.1)
Sodium: 139 mmol/L (ref 135–145)

## 2018-08-08 LAB — GLUCOSE, CAPILLARY
Glucose-Capillary: 174 mg/dL — ABNORMAL HIGH (ref 70–99)
Glucose-Capillary: 227 mg/dL — ABNORMAL HIGH (ref 70–99)
Glucose-Capillary: 215 mg/dL — ABNORMAL HIGH (ref 70–99)
Glucose-Capillary: 195 mg/dL — ABNORMAL HIGH (ref 70–99)

## 2018-08-08 NOTE — Progress Notes (Signed)
Physical Therapy Session Note  Patient Details  Name: [name] Frank MRN: [id number] Date of Birth: [birth year]  Today's Date: 08/08/2018 PT Individual Time: 6979-4801 PT Individual Time Calculation (min): 78 min   Short Term Goals: Week 1:  PT Short Term Goal 1 (Week 1): Pt will ambulate 25' w/ min assist using LRAD PT Short Term Goal 2 (Week 1): Pt will perform bed <>chair transfers w/ min assist PT Short Term Goal 3 (Week 1): Pt will demonstrate normal postural control during functional movements w/ min cues  PT Short Term Goal 4 (Week 1): Pt will follow 2-step commands w/o cues consistently during functional mobility   Skilled Therapeutic Interventions/Progress Updates:    Pt received sitting in w/c and agreeable to therapy session despite repeatedly reporting being tired. Transported to/from gym in w/c. Pt speaking minimally throughout session but able to form few word response to questions - continues to demonstrate delayed processing and initiation. Sit>stand w/c>RW with mod assist for lifting. Ambulated 22ft x2 using RW with min assist for balance - pt demonstrated shuffling, short step lengths, and decreased B LE foot clearance - manual facilitation/tactile cuing for lateral weight shifting and increased hip flexion during gait with pt demonstrating improved step length and foot clearance for ~3-4steps prior to requiring standing break due to beginning to shuffle again. Sit>stand chair/EOM>RW with CGA/min assist throughout session with pt demonstrating very slow eccentric control going to sit. Performed alternate B LE step forward/backwards to external target with B UE support on RW and CGA throughout for balance - mod/max cuing for sequencing. Progressed to performing alternate B LE foot taps on 2" step with B UE support on RW - CGA throughout for steadying - max cuing for sequencing. Ambulated 91ft x2 using RW with min assist for balance and pt demonstrating improved B LE step length and foot  clearance compared to earlier ambulation - manual facilitation and tactile cuing for weight shifting and increased hip flexion. Performed standing activity tolerance/balance with dual-cognitive task of matching cards without UE support - CGA for steadying throughout - pt requires significantly increased time to perform task for processing with min cuing. Stand pivot EOM>w/c>Nustep using RW with min assist/CGA. Performed B LE Nustep focusing on reciprocal BLE movement and general activity tolerance on level 4 resistance with cuing throughout to maintain steps per minute >30; however, pt averaging around 18 steps per minute - therapist provided music of pt's request to increase step rate with minimal improvement. Transported back to room and when questioned pt requesting to use bathroom. Ambulated ~19ft w/c>BSC over toilet using RW and min/mod assist for balance due to unlevel surface. Max assist LB clothing management for time. Stand<>sit RW<>BSC over toilet with min assist. Pt provided time on toilet but pt stated "nothings coming." Stand pivot BSC over toilet>w/c using RW with min assist. Pt left sitting in w/c with nursing staff present and therapist requesting nursing staff don seat belt alarm.  Therapy Documentation Precautions:  Precautions Precautions: Fall Restrictions Weight Bearing Restrictions: No  Pain:   Denies pain throughout session.    Therapy/Group: Individual Therapy  Ginny Forth, PT, DPT 08/08/2018, 7:56 AM

## 2018-08-08 NOTE — Progress Notes (Signed)
Occupational Therapy Session Note  Patient Details  Name: [name] Frank MRN: [id number] Date of Birth: [birth year]  Today's Date: 08/08/2018 OT Individual Time: 1300-1400 OT Individual Time Calculation (min): 60 min    Short Term Goals: Week 1:  OT Short Term Goal 1 (Week 1): Continue to assess vision  OT Short Term Goal 2 (Week 1): Pt will pick out clothing with mod questining cues  OT Short Term Goal 3 (Week 1): Pt will perform bed mobility to come to EOB in prep for ADL tasks with contact guard OT Short Term Goal 4 (Week 1): Pt will perform toileting tasks with mod A sit to stands OT Short Term Goal 5 (Week 1): Pt will perform 2 grooming tasks in standing to improve standing endurance  Skilled Therapeutic Interventions/Progress Updates:    Patient seated in w/c and finishing lunch at start of session.  He is pleasant and cooperative, flat affect and slow to respond.  Short distance ambulation and SPT to/from w/c, bed, toilet with min a for sit to stand and steadying when on his feet - cues for directionality, upright posture and shuffling gait.  Patient requires mod A for clothing management with toileting, CGA to stand at sink for oral care, hair combing and washing hands - cues for sequencing.  Patient completed seated and standing trials with the dynavision with focus on posture and reaction time - seated (bottom half of screen, 1 minute) - trial 1 = 2.73s, trial 2 = 3.16s In stance with walker (whole screen, 2 minutes) - trial 1 = 3.64s, trial 2 = 3.24s Patient returned to bed at close of session, min A for SSP to supine and to adjust in bed.  Bed alarm set and call bell in reach.    Therapy Documentation Precautions:  Precautions Precautions: Fall Restrictions Weight Bearing Restrictions: No General:   Vital Signs: Therapy Vitals Temp: 97.8 F (36.6 C) Pulse Rate: (!) 54 Resp: 20 BP: 124/62 Patient Position (if appropriate): Lying Oxygen Therapy SpO2: 98 % O2 Device:  Room Air Pain: Pain Assessment Pain Scale: 0-10 Pain Score: 0-No pain   Other Treatments:     Therapy/Group: Individual Therapy  [name] 08/08/2018, 3:28 PM

## 2018-08-08 NOTE — Progress Notes (Signed)
[place] PHYSICAL MEDICINE & REHABILITATION PROGRESS NOTE   Subjective/Complaints:   No new issues overnite, pt does not remember therapy yesterday   Review of systems difficult to obtain due to cognition   Objective:   No results found. Recent Labs    08/08/18 0528  WBC 3.2*  HGB 12.4*  HCT 37.7*  PLT 154   Recent Labs    08/08/18 0528  NA 139  K 3.5  CL 101  CO2 26  GLUCOSE 197*  BUN 18  CREATININE 0.63  CALCIUM 8.4*    Intake/Output Summary (Last 24 hours) at 08/08/2018 0728 Last data filed at 08/08/2018 0250 Gross per 24 hour  Intake 210 ml  Output 1125 ml  Net -915 ml     Physical Exam: Vital Signs Blood pressure 135/64, pulse (!) 56, temperature 98.7 F (37.1 C), temperature source Oral, resp. rate 19, weight 95.1 kg, SpO2 97 %.   General: No acute distress Scalp: Staples in place right occiput skin well-healed Mood and affect are appropriate Heart: Regular rate and rhythm no rubs murmurs or extra sounds Lungs: Clear to auscultation, breathing unlabored, no rales or wheezes Abdomen: Positive bowel sounds, soft nontender to palpation, nondistended Extremities: No clubbing, cyanosis, or edema Skin: No evidence of breakdown, no evidence of rash Neurologic: Oriented to person and place but not time, motor strength is 4/5 in bilateral deltoid, bicep, tricep, grip, hip flexor, knee extensors, ankle dorsiflexor and plantar flexor, Sensory exam normal sensation to light touch and proprioception in bilateral upper and lower extremities Cerebellar exam mild dysmetria in right upper extremity finger-nose-finger Musculoskeletal: Full range of motion in all 4 extremities. No joint swelling Patient oriented to person and place but not time  Assessment/Plan: 1. Functional deficits secondary to left subdural hematoma, subarachnoid hemorrhage following a fall at home which require 3+ hours per day of interdisciplinary therapy in a comprehensive inpatient rehab  setting.  Physiatrist is providing close team supervision and 24 hour management of active medical problems listed below.  Physiatrist and rehab team continue to assess barriers to discharge/monitor patient progress toward functional and medical goals  Care Tool:  Bathing  Bathing activity did not occur: Refused Body parts bathed by patient: Right arm, Left arm, Chest, Abdomen, Front perineal area, Face, Right upper leg, Left upper leg   Body parts bathed by helper: Buttocks, Right lower leg, Left lower leg Body parts n/a: Right upper leg, Left upper leg, Right lower leg, Left lower leg   Bathing assist Assist Level: Moderate Assistance - Patient 50 - 74%     Upper Body Dressing/Undressing Upper body dressing   What is the patient wearing?: Button up shirt    Upper body assist Assist Level: Minimal Assistance - Patient > 75%    Lower Body Dressing/Undressing Lower body dressing      What is the patient wearing?: Incontinence brief, Pants     Lower body assist Assist for lower body dressing: Moderate Assistance - Patient 50 - 74%     Toileting Toileting    Toileting assist Assist for toileting: Moderate Assistance - Patient 50 - 74%     Transfers Chair/bed transfer  Transfers assist     Chair/bed transfer assist level: Moderate Assistance - Patient 50 - 74%     Locomotion Ambulation   Ambulation assist      Assist level: Minimal Assistance - Patient > 75% Assistive device: Walker-rolling Max distance: 10'   Walk 10 feet activity   Assist  Assist level: Minimal Assistance - Patient > 75% Assistive device: Walker-rolling   Walk 50 feet activity   Assist Walk 50 feet with 2 turns activity did not occur: Safety/medical concerns         Walk 150 feet activity   Assist Walk 150 feet activity did not occur: Safety/medical concerns         Walk 10 feet on uneven surface  activity   Assist Walk 10 feet on uneven surfaces activity  did not occur: Safety/medical concerns         Wheelchair     Assist Will patient use wheelchair at discharge?: No   Wheelchair activity did not occur: N/A         Wheelchair 50 feet with 2 turns activity    Assist    Wheelchair 50 feet with 2 turns activity did not occur: N/A       Wheelchair 150 feet activity     Assist Wheelchair 150 feet activity did not occur: N/A          Medical Problem List and Plan: 1.Functional declinesecondary toAMS, 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 Exhibits signs of aphasia, poor activation trial ritalin  C I R PT OT speech  2. Antithrombotics: -DVT/anticoagulation:Mechanical:Sequential compression devices, below kneeBilateral lower extremities -antiplatelet therapy: N/A 3. Pain Management:Tylenol prn. 4. Mood:LCSW to follow for evaluation and support. -antipsychotic agents: N/A 5. Neuropsych: This patientis notcapable of making decisions on hisown behalf. 6. Skin/Wound Care:routine pressure relief measures. 7. Fluids/Electrolytes/Nutrition:Monitor I/O.8. T2DM: Monitor BS ac/hs. Continue to hold glucotrol for now and use SSI for elevated BS.Adjust regimen as needed. 9. CAD/PAF/A flutter: Off Plavix due to SDH. Monitor HR bid. Continue Cardizem CD and Toprol XL. Will order orthostatic vitals due to reports of dizziness. Order TEDs. Continue to hold Losartan.Pt with regular rhythm at present Vitals:   08/07/18 1937 08/08/18 0311  BP: 117/63 135/64  Pulse: (!) 54 (!) 56  Resp: 17 19  Temp: 98.5 F (36.9 C) 98.7 F (37.1 C)  SpO2: 97% 97%  reduced cardizem to 300mg  per day due to bradycardia and soft BP, goal is HR>50bpm  10. Left > Right hydrocele: On Augmentin D# 6 afebrile 11. AKI with hyponateremia: Has resolved with hydration.BMET in am  12. New onset seizures: Continue Keppra 750 mg bid.  No seizure  activity noted since admission to rehabilitation but has been lethargic, would trial ritalin to activate, Keppra has been increased due to suspected seizure activity but no observe seizures, this may be contributing to lethargy 13.  Urinary retention will start Flomax monitor for orthostatic hypotension LOS: 7 days A FACE TO FACE EVALUATION WAS PERFORMED  Erick Colacendrew E Kreig Parson 08/08/2018, 7:28 AM

## 2018-08-08 NOTE — Evaluation (Signed)
Speech Language Pathology Bedside Swallow Evaluation  Patient Details  Name: [name] Frank MRN: [id number] Date of Birth: 07/20/1950  SLP Diagnosis: Cognitive Impairments ; Mild oral dysphagia Rehab Potential: Good ELOS: 6-8 days    Today's Date: 08/08/2018 SLP Individual Time: 0800-0830 SLP Individual Time Calculation (min): 30 min   Problem List:  Patient Active Problem List   Diagnosis Date Noted  . Leukopenia   . Hypoalbuminemia due to protein-calorie malnutrition ([place])   . Urinary retention   . Hydrocele   . Diabetes mellitus type 2 in obese ([place])   . Somnolence   . SDH (subdural hematoma) ([place]) 08/01/2018  . Hydrocephalus in adult [facility]) 07/29/2018  . Hydrocephalus ([place]) 07/28/2018  . Mild renal insufficiency 07/28/2018  . Paroxysmal atrial fibrillation ([place]) 07/28/2018  . Subdural hematoma ([place]) 07/14/2018  . Hypertension, essential 01/16/2018  . Controlled type 2 diabetes mellitus without complication, without long-term current use of insulin ([place]) 01/16/2018  . Coronary artery disease involving native heart without angina pectoris 01/16/2018  . Diabetes mellitus type II, non insulin dependent ([place]) 01/13/2011   Past Medical History:  Past Medical History:  Diagnosis Date  . Diabetes mellitus   . Heart disease   . Hyperlipemia   . Hypertension    Past Surgical History:  Past Surgical History:  Procedure Laterality Date  . catarracts    . OTHER SURGICAL HISTORY  2016   HEART STENT  . TONSILLECTOMY      Assessment / Plan / Recommendation Clinical Impression  Per RN report, patient noted "coughing" during meals on 08/07/2018. Beside swallowing assessment completed today to assess current oropharyngeal swallowing function and determine least restrictive diet. Patient presents with s/s of mild oral dysphagia (suspected to be caused by cognitive-linguistic deficits + fatigue) with the following characteristics: decreased initiation of self-feeding, prolonged oral  transit of soft/hard solids, and mild oral residue of hard solids - However, transit and residue improved given verbal cues and encouragement to alternation between l/s. No anterior oral spillage, pocketing, immediate coughing, and/or choking episodes noted. Delayed throat clearing noted in x2-3 observations following solid consistencies, however possibly unrelated to penetration/aspiration. D/t the above factors, SLP recommends to continue Regular/Thin liquid diet given supervision/assistance during meals by staff with additional safe swallowing strategies (I.e. alternating liquids/solids and check oral cavity for pocketed food). D/t daily staff assistance implemented during meals and patient's ability to follow verbal cues/commands adequately from SLP/staff during PO intake, additional skilled ST services to address oral dysphagia not warranted at this time.   Education provided to RN: re: updated swallowing precautions and continued assistance needed during PO intake. RN verbalized understanding and agreement. Patient left upright in chair with chair alarm set and RN present in room providing meds. Continue POC for cognitive-linguistic related goals.   Skilled Therapeutic Interventions          Administered bedside swallowing assessment; Education provided to patient and RN re: updated swallowing precautions (I.e. alternating l/s, check for oral holding/pocketing intermittently during meals) with recommendation to continue current regular/thin liquid diet.   SLP Assessment  Patient does not need any further Speech Lanaguage Pathology Services for dysphagia treatment.   Recommendations  SLP Diet Recommendations: (Regular and Thin liquid diet) Liquid Administration via: Cup;Straw Medication Administration: (Per MD/RN discretion d/t fluctuating alertness and cognitive-linguistic status) Supervision: Intermittent supervision to cue for compensatory strategies Compensations: Small sips/bites;Slow  rate;Minimize environmental distractions;Lingual sweep for clearance of pocketing;Follow solids with liquid Postural Changes and/or Swallow Maneuvers: Seated upright 90 degrees;Upright 30-60 min  after meal Oral Care Recommendations: Oral care QID Recommendations for Other Services: Neuropsych consult Patient destination: Home Follow up Recommendations: 24 hour supervision/assistance;Outpatient SLP Equipment Recommended: None recommended by SLP    SLP Frequency 3 to 5 out of 7 days to address cognitive-communication deficit only  SLP Duration  SLP Intensity  SLP Treatment/Interventions 6-8 days  Minumum of 1-2 x/day, 30 to 90 minutes to address cognitive-communication deficit only  Patient/family education;Dysphagia/aspiration precaution training    Pain Pain Assessment Pain Scale: Faces Pain Score: 3  Faces Pain Scale: Hurts a little bit Pain Type: Other (Comment)(unable to tell) Pain Intervention(s): Medication (See eMAR)    Short Term Goals: N/A  Refer to Care Plan for Long Term Goals  Recommendations for other services: Neuropsych  Discharge Criteria: Patient will be discharged from SLP if patient refuses treatment 3 consecutive times without medical reason, if treatment goals not met, if there is a change in medical status, if patient makes no progress towards goals or if patient is discharged from hospital.  The above assessment, treatment plan, treatment alternatives and goals were discussed and mutually agreed upon: by patient  Loni Beckwith, M.S. CCC-SLP Speech-Language Pathologist  Loni Beckwith 08/08/2018, 12:15 PM

## 2018-08-08 NOTE — Progress Notes (Signed)
Occupational Therapy Session Note  Patient Details  Name: [name] Frank MRN: [id number] Date of Birth: 04-Jan-1951  Today's Date: 08/08/2018 OT Individual Time: 2025-4270 OT Individual Time Calculation (min): 30 min   Short Term Goals: Week 1:  OT Short Term Goal 1 (Week 1): Continue to assess vision  OT Short Term Goal 2 (Week 1): Pt will pick out clothing with mod questining cues  OT Short Term Goal 3 (Week 1): Pt will perform bed mobility to come to EOB in prep for ADL tasks with contact guard OT Short Term Goal 4 (Week 1): Pt will perform toileting tasks with mod A sit to stands OT Short Term Goal 5 (Week 1): Pt will perform 2 grooming tasks in standing to improve standing endurance  Skilled Therapeutic Interventions/Progress Updates:    Pt greeted in w/c with no c/o pain. Agreeable to attempt toileting. Ambulatory toilet transfer completed using RW with Min A. He required increased time to self organize during sit<stand transitions. Instructional and tactile cuing required for toileting tasks, and also while completing hand washing and oral care after while standing at sink. He did initiate clothing mgt x2 after a few minutes of sitting on the toilet. Pt unable to void. Pt required Steady-Min A for balance overall. At end of session pt returned to w/c and we put on his suspenders so pants would stay up. He was left with all needs within reach and safety belt fastened. Tx focus placed on functional ambulation, balance, ADL retraining, and cognition.   Therapy Documentation Precautions:  Precautions Precautions: Fall Restrictions Weight Bearing Restrictions: No Vital Signs: Therapy Vitals Temp: 97.9 F (36.6 C) Pulse Rate: 66 Resp: 18 BP: 119/62 Oxygen Therapy SpO2: 98 % Pain: Pain Assessment Pain Scale: Faces Pain Score: 3  Faces Pain Scale: Hurts a little bit Pain Type: Other (Comment)(unable to tell) Pain Intervention(s): Medication (See eMAR) ADL: ADL Eating: Set  up Where Assessed-Eating: Chair Grooming: Moderate assistance Where Assessed-Grooming: Sitting at sink Upper Body Bathing: Minimal assistance Where Assessed-Upper Body Bathing: Shower Lower Body Bathing: Moderate assistance Where Assessed-Lower Body Bathing: Shower Upper Body Dressing: Moderate assistance Where Assessed-Upper Body Dressing: Sitting at sink Lower Body Dressing: Maximal assistance Where Assessed-Lower Body Dressing: Sitting at sink Toileting: Maximal assistance Toilet Transfer: Minimal assistance Toilet Transfer Method: Freight forwarder Equipment: Engineer, manufacturing systems Transfer: Minimal assistance Film/video editor Method: Designer, industrial/product: Shower seat with back, Grab bars      Therapy/Group: Individual Therapy  [name] A [name] 08/08/2018, 12:07 PM

## 2018-08-08 NOTE — Progress Notes (Addendum)
Speech Language Pathology Weekly Progress and Session Note  Patient Details  Name: [name] Frank MRN: [id number] Date of Birth: 01/19/1951  Beginning of progress report period: Aug 02, 2018 End of progress report period: Aug 08, 2018  Today's Date: 08/08/2018 SLP Individual Time: 0830-0900 SLP Individual Time Calculation (min): 30 min  Short Term Goals: Week 1: SLP Short Term Goal 1 (Week 1): Pt will initiate functional task in 8 out of 10 opportunities with Min A cues.  SLP Short Term Goal 1 - Progress (Week 1): Revised due to lack of progress SLP Short Term Goal 2 (Week 1): Pt will demonstrate selective attention in mildly distracting environment for ~ 30 minutes with Min A cues.  SLP Short Term Goal 2 - Progress (Week 1): Revised due to lack of progress SLP Short Term Goal 3 (Week 1): Pt will solve semi-complex problem solving tasks with Min A cues.  SLP Short Term Goal 3 - Progress (Week 1): Revised due to lack of progress SLP Short Term Goal 4 (Week 1): Pt will demonstrate intellectual awareness by listing 3 physical and cognitive deficits related to acute condition with Min A cues.  SLP Short Term Goal 4 - Progress (Week 1): Revised due to lack of progress SLP Short Term Goal 5 (Week 1): Pt will follow semi-complex 2 step directions in 7 out of 10 opportunities with Min A cues.  SLP Short Term Goal 5 - Progress (Week 1): Revised due to lack of progress    New Short Term Goals: Week 2: SLP Short Term Goal 1 (Week 2): Pt will initiate functional task in 3 out of 5 opportunities with Mod A cues.  SLP Short Term Goal 2 (Week 2): Pt will demonstrate selective attention in mildly distracting environment for ~ 5 minutes with Mod A cues.  SLP Short Term Goal 3 (Week 2): Pt will solve simple problem solving tasks with Mod A cues.  SLP Short Term Goal 4 (Week 2): Pt will follow simple 2-step directions in 3 out of 5 opportunities with Mod A cues.   Weekly Progress Updates:  Patient has  demonstrated functional decline in cognitive-linguistic skills since [facility] complicated by overall fatigue. Significant difficulty with word recognition, object discrimination, naming, and recall activities have been noted during sessions. Patient has continued to demonstrate poor initiation with all functional and structured tasks and has required constant cueing (I.e. repetition and simplification of directions). Patient also continues to require encouragement for verbal responses and frequently provides non-verbal communication methods (head shakes/nods, shoulder shrugs) with limited verbal expression independently. In turn, weekly goals have been downgraded to accommodate for patient's functional decline in order to increase patient's ability to functionally meet cognitive-linguistic goals. Recommend to continue skilled ST services in order to improve overall expressive/receptive language goals, increase initiation in functional/daily tasks, and improve safety/judgement in current environment.  Intensity: Minumum of 1-2 x/day, 30 to 90 minutes Frequency: 3 to 5 out of 7 days Duration/Length of Stay: 6-8 days Treatment/Interventions: Cognitive remediation/compensation;Multimodal communication approach;Speech/Language facilitation;Environmental controls;Cueing hierarchy;Functional tasks;Therapeutic Activities;Patient/family education;Internal/external aids   Daily Session  Skilled Therapeutic Interventions:    Skilled ST services focused on receptive language skills and safety awareness/judgement. Patient required set-up assist and verbal cues in order to complete oral care task following PO intake. RN provided assist re: transfer from bed to w/c. Patient required mod-max verbal cues in order to follow simple 1-step directions to stand-pivot to w/c with RN and SLP assistance - Benefitted from simplification and repetition of verbal  instructions. Education provided re: use of call button to call nursing  staff for assistance, however unable to verbalize reasons for nursing assistance. Minimal verbal responses provided by patient, as he demonstrated tendency to gesture his responses (I.e. shoulder shrugs, head shakes/nods, hand gestures) even with mod-max encouragement. Education provided to nursing staff re: safety precautions with patient when providing medications d/t changes in cognitive-linguistic status and increased lethargy (I.e. crushing pills if necessary). RN verbalized understanding and agreement. Patient left upright in w/c, chair alarm set, call bell in reach, RN present providing meds. Continue POC with updated cognitive-linguistic goals.    General    Pain Pain Assessment Pain Scale: 0-10 Pain Score: 0-No pain  Therapy/Group: Individual Therapy   Everlean PattersonNancy Emmaleigh Longo, M.S. CCC-SLP Speech-Language Pathologist  Everlean Pattersonancy [place] 08/08/2018, 12:48 PM

## 2018-08-09 ENCOUNTER — Inpatient Hospital Stay (HOSPITAL_COMMUNITY): Payer: Medicare HMO

## 2018-08-09 DIAGNOSIS — R001 Bradycardia, unspecified: Secondary | ICD-10-CM

## 2018-08-09 LAB — GLUCOSE, CAPILLARY
Glucose-Capillary: 174 mg/dL — ABNORMAL HIGH (ref 70–99)
Glucose-Capillary: 255 mg/dL — ABNORMAL HIGH (ref 70–99)
Glucose-Capillary: 176 mg/dL — ABNORMAL HIGH (ref 70–99)
Glucose-Capillary: 222 mg/dL — ABNORMAL HIGH (ref 70–99)

## 2018-08-09 MED ORDER — GLIPIZIDE 5 MG PO TABS
2.5000 mg | ORAL_TABLET | Freq: Every day | ORAL | Status: DC
  Administered 2018-08-10 – 2018-08-14 (×5): 2.5 mg via ORAL
  Filled 2018-08-09 (×5): qty 1

## 2018-08-09 NOTE — Progress Notes (Signed)
Occupational Therapy Weekly Progress Note  Patient Details  Name: [name] Frank MRN: [id number] Date of Birth: December 11, 1950  Beginning of progress report period: Aug 02, 2018 End of progress report period: Aug 09, 2018  Today's Date: 08/09/2018   Patient has met 2 of 5 short term goals.  Pt continues to make slow progress toward supervision level goals with OT.  He continues to demonstrate limited sustained attention to less than 30 seconds for most self care tasks requiring max instructional cueing and overall min assist for bathing with mod to max for all dressing.  He continues to demonstrate fluctuating orientation as well as decreased ability to respond appropriately and accurately to questions that are being asked of him.  Transfers with use of the RW are min to mod assist depending on attention level and ability to advance the RW and stay closer to it.  Increased posterior LOB is noted at times in standing as well as when sitting, requiring mod assist to correct.  Feel most of his progress has been limited by his attention and cognition.  Feel if attention improves he can reach supervision level goals for discharge.  Recommend continued CIR level rehab with anticipation of discharge on 6/6.    Patient continues to demonstrate the following deficits: muscle weakness, decreased initiation, decreased attention, decreased awareness, decreased problem solving, decreased safety awareness, decreased memory and delayed processing and decreased sitting balance, decreased standing balance, decreased postural control and decreased balance strategies and therefore will continue to benefit from skilled OT intervention to enhance overall performance with BADL.  Patient progressing toward long term goals..  Continue plan of care.  OT Short Term Goals Week 2:  OT Short Term Goal 1 (Week 2): Continue working on established goals set at supervision level overall.     Therapy Documentation Precautions:   Precautions Precautions: Fall Restrictions Weight Bearing Restrictions: No  ADL: See Care Tool Section for ADL details  Therapy/Group: Individual Therapy  Tiffany Talarico OTR/L 08/09/2018, 4:34 PM

## 2018-08-09 NOTE — Progress Notes (Signed)
Physical Therapy Weekly Progress Note  Patient Details  Name: [name] Frank MRN: [id number] Date of Birth: 1950/08/20  Beginning of progress report period: Aug 02, 2018 End of progress report period: Aug 09, 2018  Today's Date: 08/09/2018 PT Individual Time: 0801-0900 PT Individual Time Calculation (min): 59 min   Patient has met 2 of 4 short term goals. Pt is ambulating and performing transfers using RW and min assist, however continues to be limited by slow initiation, poor awareness, and decreased attention affecting all mobility. Pt requires increased time to respond to all commands and demonstrates slow gait speed and decreased step length during gait, difficulty correcting even with max cues.   Patient continues to demonstrate the following deficits muscle weakness, impaired timing and sequencing, decreased coordination and decreased motor planning, decreased initiation, decreased attention and decreased safety awareness and decreased standing balance, decreased postural control and decreased balance strategies and therefore will continue to benefit from skilled PT intervention to increase functional independence with mobility.  Patient progressing toward long term goals..  Continue plan of care.  PT Short Term Goals Week 1:  PT Short Term Goal 1 (Week 1): Pt will ambulate 25' w/ min assist using LRAD PT Short Term Goal 1 - Progress (Week 1): Met PT Short Term Goal 2 (Week 1): Pt will perform bed <>chair transfers w/ min assist PT Short Term Goal 2 - Progress (Week 1): Met PT Short Term Goal 3 (Week 1): Pt will demonstrate normal postural control during functional movements w/ min cues  PT Short Term Goal 3 - Progress (Week 1): Progressing toward goal PT Short Term Goal 4 (Week 1): Pt will follow 2-step commands w/o cues consistently during functional mobility  PT Short Term Goal 4 - Progress (Week 1): Progressing toward goal Week 2:  PT Short Term Goal 1 (Week 2): STG=LTG due to  ELOS  Skilled Therapeutic Interventions/Progress Updates:  Ambulation/gait training;Cognitive remediation/compensation;Discharge planning;DME/adaptive equipment instruction;Functional mobility training;Pain management;Psychosocial support;Therapeutic Activities;Splinting/orthotics;Visual/perceptual remediation/compensation;UE/LE Strength taining/ROM;UE/LE Coordination activities;Therapeutic Exercise;Stair training;Skin care/wound management;Patient/family education;Neuromuscular re-education;Functional electrical stimulation;Disease management/prevention;Community reintegration;Balance/vestibular training   Pt supine in bed upon PT arrival, agreeable to therapy tx and denies pain. Therapist donned yellow grip socks total assist for time management. Pt transferred to sitting EOB with mod assist. Pt reports having to use the bathroom. Pt ambulated from bed>bathroom x 12 ft with RW and min assist, cues for increased step length and upright posture. Pt continent of bowel, pt maintained standing balance with min assist and cues to correct lateral lean while performing pericare and clothing management with set up/stand by assist. Pt performed stand pivot from toilet>w/c with min assist and RW, slow to initiate and cues for foot placement. Pt transported to the gym. Pt ambulated x 25 ft this session with RW and min assist, slow to initiate, decreased gait speed and decreased L step length with cues to correct. Pt used nustep this session x 6 minutes, workload 3 working on reciprocal UE/LE movement and increased speed. Pt transferred to w/c min assist and RW, increased time to complete. Pt transported back to room and left in w/c with needs in reach and chair alarm set.   Therapy Documentation Precautions:  Precautions Precautions: Fall Restrictions Weight Bearing Restrictions: No   Therapy/Group: Individual Therapy  Netta Corrigan, PT, DPT 08/09/2018, 8:19 AM

## 2018-08-09 NOTE — Progress Notes (Signed)
[place] PHYSICAL MEDICINE & REHABILITATION PROGRESS NOTE   Subjective/Complaints: Patient seen laying in bed this morning.  He indicates he slept well overnight.  He appears to give appropriate thumbs up/down.  Review of systems difficult to obtain due to cognition, but gives thumbs down to chest pain, shortness of breath, nausea, vomiting, diarrhea.  Objective:   No results found. Recent Labs    08/08/18 0528  WBC 3.2*  HGB 12.4*  HCT 37.7*  PLT 154   Recent Labs    08/08/18 0528  NA 139  K 3.5  CL 101  CO2 26  GLUCOSE 197*  BUN 18  CREATININE 0.63  CALCIUM 8.4*    Intake/Output Summary (Last 24 hours) at 08/09/2018 1007 Last data filed at 08/09/2018 0900 Gross per 24 hour  Intake 240 ml  Output 500 ml  Net -260 ml     Physical Exam: Vital Signs Blood pressure 135/68, pulse (!) 52, temperature (!) 97.5 F (36.4 C), resp. rate 18, weight 95.1 kg, SpO2 99 %. Constitutional: No distress . Vital signs reviewed. HENT: Normocephalic.  Atraumatic. Eyes: EOMI. No discharge. Cardiovascular: No JVD. Respiratory: Normal effort. GI: Non-distended. Musc: No edema or tenderness in extremities. Neurologic: Alert Motor: 4/5 throughout Patient: Appears to have normal mood and affect.  Assessment/Plan: 1. Functional deficits secondary to left subdural hematoma, subarachnoid hemorrhage following a fall at home which require 3+ hours per day of interdisciplinary therapy in a comprehensive inpatient rehab setting.  Physiatrist is providing close team supervision and 24 hour management of active medical problems listed below.  Physiatrist and rehab team continue to assess barriers to discharge/monitor patient progress toward functional and medical goals  Care Tool:  Bathing  Bathing activity did not occur: Refused Body parts bathed by patient: Right arm, Left arm, Chest, Abdomen, Front perineal area, Face, Right upper leg, Left upper leg   Body parts bathed by helper:  Buttocks, Right lower leg, Left lower leg Body parts n/a: Right upper leg, Left upper leg, Right lower leg, Left lower leg   Bathing assist Assist Level: Moderate Assistance - Patient 50 - 74%     Upper Body Dressing/Undressing Upper body dressing   What is the patient wearing?: Button up shirt    Upper body assist Assist Level: Minimal Assistance - Patient > 75%    Lower Body Dressing/Undressing Lower body dressing      What is the patient wearing?: Incontinence brief, Pants     Lower body assist Assist for lower body dressing: Moderate Assistance - Patient 50 - 74%     Toileting Toileting    Toileting assist Assist for toileting: Moderate Assistance - Patient 50 - 74%     Transfers Chair/bed transfer  Transfers assist     Chair/bed transfer assist level: Minimal Assistance - Patient > 75%     Locomotion Ambulation   Ambulation assist      Assist level: Minimal Assistance - Patient > 75% Assistive device: Walker-rolling Max distance: 25 ft   Walk 10 feet activity   Assist     Assist level: Minimal Assistance - Patient > 75% Assistive device: Walker-rolling   Walk 50 feet activity   Assist Walk 50 feet with 2 turns activity did not occur: Safety/medical concerns         Walk 150 feet activity   Assist Walk 150 feet activity did not occur: Safety/medical concerns         Walk 10 feet on uneven surface  activity  Assist Walk 10 feet on uneven surfaces activity did not occur: Safety/medical concerns         Wheelchair     Assist Will patient use wheelchair at discharge?: No   Wheelchair activity did not occur: N/A         Wheelchair 50 feet with 2 turns activity    Assist    Wheelchair 50 feet with 2 turns activity did not occur: N/A       Wheelchair 150 feet activity     Assist Wheelchair 150 feet activity did not occur: N/A          Medical Problem List and Plan: 1.Functional  declinesecondary toAMS, 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 Exhibits signs of aphasia, poor activation trial ritalin   Continue CIR 2. Antithrombotics: -DVT/anticoagulation:Mechanical:Sequential compression devices, below kneeBilateral lower extremities -antiplatelet therapy: N/A 3. Pain Management:Tylenol prn. 4. Mood:LCSW to follow for evaluation and support. -antipsychotic agents: N/A 5. Neuropsych: This patientis notcapable of making decisions on hisown behalf. 6. Skin/Wound Care:routine pressure relief measures. 7. Fluids/Electrolytes/Nutrition:Monitor I/Os. 8. T2DM: Monitor BS ac/hs. SSI for elevated BS.Adjust regimen as needed.  Glucotrol 2.5 added on 5/30  9. CAD/PAF/A flutter: Off Plavix due to SDH. Monitor HR bid. Continue Cardizem CD and Toprol XL. Will order orthostatic vitals due to reports of dizziness. Ordered TEDs. Continue to hold Losartan.Pt with regular rhythm at present Vitals:   08/08/18 1949 08/09/18 0338  BP: (!) 111/55 135/68  Pulse: (!) 50 (!) 52  Resp: 18 18  Temp: 98 F (36.7 C) (!) 97.5 F (36.4 C)  SpO2: 99% 99%   Reduced cardizem to 300mg  per day due to bradycardia and soft BP, goal is HR>50bpm 10. Left > Right hydrocele: On Augmentin, consider DC early next week 11. AKI with hyponateremia: Resolved   12. New onset seizures: Continue Keppra 750 mg bid.  Keppra has been increased due to suspected seizure activity but no observe seizures 13.  Urinary retention: Continue Flomax monitor for orthostatic hypotension  LOS: 8 days A FACE TO FACE EVALUATION WAS PERFORMED   Karis JubaAnil  08/09/2018, 10:07 AM

## 2018-08-10 ENCOUNTER — Inpatient Hospital Stay (HOSPITAL_COMMUNITY): Payer: Medicare HMO

## 2018-08-10 LAB — GLUCOSE, CAPILLARY
Glucose-Capillary: 161 mg/dL — ABNORMAL HIGH (ref 70–99)
Glucose-Capillary: 202 mg/dL — ABNORMAL HIGH (ref 70–99)
Glucose-Capillary: 163 mg/dL — ABNORMAL HIGH (ref 70–99)
Glucose-Capillary: 229 mg/dL — ABNORMAL HIGH (ref 70–99)

## 2018-08-10 NOTE — Progress Notes (Signed)
[place] PHYSICAL MEDICINE & REHABILITATION PROGRESS NOTE   Subjective/Complaints: Patient seen laying in bed this morning.  Indicates he slept well overnight.  Appears to provide appropriate thumbs up/thumbs down.  Review of systems: Appears to deny chest pain, shortness of breath, nausea, vomiting, diarrhea.  Objective:   No results found. Recent Labs    08/08/18 0528  WBC 3.2*  HGB 12.4*  HCT 37.7*  PLT 154   Recent Labs    08/08/18 0528  NA 139  K 3.5  CL 101  CO2 26  GLUCOSE 197*  BUN 18  CREATININE 0.63  CALCIUM 8.4*    Intake/Output Summary (Last 24 hours) at 08/10/2018 0917 Last data filed at 08/10/2018 0259 Gross per 24 hour  Intake 420 ml  Output 810 ml  Net -390 ml     Physical Exam: Vital Signs Blood pressure 118/62, pulse (!) 51, temperature 98.1 F (36.7 C), temperature source Oral, resp. rate 18, weight 95.1 kg, SpO2 97 %. Constitutional: No distress . Vital signs reviewed. HENT: Normocephalic.  Atraumatic. Eyes: EOMI.  No discharge. Cardiovascular: No JVD. Respiratory: Normal effort. GI: Non-distended. Musc: No edema or tenderness in extremities. Neurologic: Alert Motor: 4/5 throughout Patient: Appears to have normal mood and affect.  Assessment/Plan: 1. Functional deficits secondary to left subdural hematoma, subarachnoid hemorrhage following a fall at home which require 3+ hours per day of interdisciplinary therapy in a comprehensive inpatient rehab setting.  Physiatrist is providing close team supervision and 24 hour management of active medical problems listed below.  Physiatrist and rehab team continue to assess barriers to discharge/monitor patient progress toward functional and medical goals  Care Tool:  Bathing  Bathing activity did not occur: Refused Body parts bathed by patient: Right arm, Left arm, Chest, Abdomen, Front perineal area, Face, Right upper leg, Left upper leg   Body parts bathed by helper: Buttocks, Right  lower leg, Left lower leg Body parts n/a: Right upper leg, Left upper leg, Right lower leg, Left lower leg   Bathing assist Assist Level: Moderate Assistance - Patient 50 - 74%     Upper Body Dressing/Undressing Upper body dressing   What is the patient wearing?: Button up shirt    Upper body assist Assist Level: Minimal Assistance - Patient > 75%    Lower Body Dressing/Undressing Lower body dressing      What is the patient wearing?: Incontinence brief, Pants     Lower body assist Assist for lower body dressing: Moderate Assistance - Patient 50 - 74%     Toileting Toileting    Toileting assist Assist for toileting: Moderate Assistance - Patient 50 - 74%     Transfers Chair/bed transfer  Transfers assist     Chair/bed transfer assist level: Minimal Assistance - Patient > 75%     Locomotion Ambulation   Ambulation assist      Assist level: Minimal Assistance - Patient > 75% Assistive device: Walker-rolling Max distance: 25 ft   Walk 10 feet activity   Assist     Assist level: Minimal Assistance - Patient > 75% Assistive device: Walker-rolling   Walk 50 feet activity   Assist Walk 50 feet with 2 turns activity did not occur: Safety/medical concerns         Walk 150 feet activity   Assist Walk 150 feet activity did not occur: Safety/medical concerns         Walk 10 feet on uneven surface  activity   Assist Walk 10 feet on uneven  surfaces activity did not occur: Safety/medical concerns         Wheelchair     Assist Will patient use wheelchair at discharge?: No   Wheelchair activity did not occur: N/A         Wheelchair 50 feet with 2 turns activity    Assist    Wheelchair 50 feet with 2 turns activity did not occur: N/A       Wheelchair 150 feet activity     Assist Wheelchair 150 feet activity did not occur: N/A          Medical Problem List and Plan: 1.Functional declinesecondary toAMS, new  onset seizures and recent TBI(left frontal SDH, right occipital SAH) with subsequent hydrocephalus. Exhibits signs of aphasia, poor activation trial ritalin   Continue CIR 2. Antithrombotics: -DVT/anticoagulation:Mechanical:Sequential compression devices, below kneeBilateral lower extremities -antiplatelet therapy: N/A 3. Pain Management:Tylenol prn. 4. Mood:LCSW to follow for evaluation and support. -antipsychotic agents: N/A 5. Neuropsych: This patientis notcapable of making decisions on hisown behalf. 6. Skin/Wound Care:routine pressure relief measures. 7. Fluids/Electrolytes/Nutrition:Monitor I/Os. 8. T2DM: Monitor BS ac/hs. SSI for elevated BS.Adjust regimen as needed.  Glucotrol 2.5 added on 5/30   Remains labile and elevated on 5/31 9. CAD/PAF/A flutter: Off Plavix due to SDH. Monitor HR bid. Continue Cardizem CD and Toprol XL. Will order orthostatic vitals due to reports of dizziness. Ordered TEDs. Continue to hold Losartan.Pt with regular rhythm at present Vitals:   08/09/18 2018 08/10/18 0316  BP: 117/61 118/62  Pulse: (!) 51 (!) 51  Resp: 18 18  Temp: 98.7 F (37.1 C) 98.1 F (36.7 C)  SpO2: 95% 97%   Reduced cardizem to 300mg  per day due to bradycardia and soft BP, goal is HR>50bpm-borderline at present 10. Left > Right hydrocele: On Augmentin, will consider discontinuing 11. AKI with hyponateremia: Resolved   12. New onset seizures: Continue Keppra 750 mg bid.  Keppra has been increased due to suspected seizure activity but no observe seizures 13.  Urinary retention: Continue Flomax monitor for orthostatic hypotension  LOS: 9 days A FACE TO FACE EVALUATION WAS PERFORMED  [name] Frank [name] Frank [name] Frank 08/10/2018, 9:17 AM

## 2018-08-10 NOTE — Progress Notes (Signed)
Physical Therapy Session Note  Patient Details  Name: [name] Frank MRN: [id number] Date of Birth: 01-27-1951  Today's Date: 08/10/2018 PT Individual Time: 1500-1556 PT Individual Time Calculation (min): 56 min   Short Term Goals: Week 2:  PT Short Term Goal 1 (Week 2): STG=LTG due to ELOS  Skilled Therapeutic Interventions/Progress Updates:    Pt supine in bed upon PT arrival, agreeable to therapy tx and denies pain. Pt disoriented to time, pt asks if it is 3 am. Therapist reports it is 3 in the afternoon. Pt donned shorts with total assist. Pt transferred to sitting EOB with max assist, in sitting pt with strong posterior lean, unable to correct despite assist and cues, resulted in posterior total LOB backwards. Pt assisted back to sitting max assist. Pt performed sit<>stand with mod assist and RW to pull pants over hips, strong posterior lean today in standing and pt unable to correct, max assist for standing balance. Pt with LOB backwards and to the L back onto bed. Pt seated EOB worked on seated balance and anterior weightshift to perform reaching task outside of BOS and another reaching task to pick up items off floor, encouraging anterior weightshift and lean. Pt performed stand pivot to w/c with mod assist for sit<>stand and stand pivot with RW and min assist, cues for foot placement and sequencing, slow to initiate. Pt transported to the gym. Pt worked on standing balance this session to perform overhead reaching task and horseshoe toss, max cues to correct posterior LOB and pt with x1 total LOB posteriorly back into w/c during this task. Pt finished task in sitting to perform anterior weightshift reaching for horseshoes, cues to lean forward. Pt transported back to room and left in w/c with needs in reach and chair alarm set.   Therapy Documentation Precautions:  Precautions Precautions: Fall Restrictions Weight Bearing Restrictions: No    Therapy/Group: Individual Therapy  Cresenciano Genre, PT, DPT 08/10/2018, 12:16 PM

## 2018-08-11 ENCOUNTER — Inpatient Hospital Stay (HOSPITAL_COMMUNITY): Payer: Medicare HMO | Admitting: Occupational Therapy

## 2018-08-11 ENCOUNTER — Inpatient Hospital Stay (HOSPITAL_COMMUNITY): Payer: Medicare HMO

## 2018-08-11 LAB — GLUCOSE, CAPILLARY
Glucose-Capillary: 179 mg/dL — ABNORMAL HIGH (ref 70–99)
Glucose-Capillary: 246 mg/dL — ABNORMAL HIGH (ref 70–99)
Glucose-Capillary: 186 mg/dL — ABNORMAL HIGH (ref 70–99)
Glucose-Capillary: 166 mg/dL — ABNORMAL HIGH (ref 70–99)

## 2018-08-11 NOTE — Progress Notes (Signed)
Physical Therapy Session Note  Patient Details  Name: [name] Frank MRN: [id number] Date of Birth: 1951/02/07  Today's Date: 08/11/2018 PT Individual Time: 1301-1414 and 1600-1610 PT Individual Time Calculation (min): 73 min and 10 min   Missed time: 20 minutes (patient fatigue)  Short Term Goals: Week 2:  PT Short Term Goal 1 (Week 2): STG=LTG due to ELOS  Skilled Therapeutic Interventions/Progress Updates:    Session 1: Pt seated in w/c upon PT arrival, agreeable to therapy tx and denies pain. Pt reports having to use the bathroom when asked. Pt ambulated from w/c<>toiliet this session 2 x 10 ft with HHA mod assist, cues for step length and awareness. Standing balance with mod assist while therapist assisted with clothing management. Pt unable to void on toilet this session. Pt transported to the gym. Session focused on standing balance and cognitive remediation to perform various tasks, min-mod assist for sit<>stands from w/c and CGA for standing balance with UE support on table throughout session. Pt standing at high table in the gym participated in clothespin sorting task by color and following commands by removing specific numbers of clothespins at a time, therapist asked pt how many clothes pins were left, pt was off by 1 number and provided with cues for re-counting. Pt worked on card matching activity in standing, min cues for attention to the task and to correct errors, verbal cues to  "find the exact match." While standing pt worked on sorting task in order to sort cards by suit and then to sort card less than and greater than 7, mod-max cues for this task and increased time to complete. While standing pt performed sorting task to sort bean bags by color and then worked on following 2 step commands to put two different bean bag colors in a bin at a time, min-mod cues for task. Pt ambulated x 38 ft without AD and L UE over therapists shoulder this session, cues for upright posture, awareness  and increased step length. Pt transported back to the room and left seated in w/c with needs in reach and chair alarm set.   Session 2: Pt supine in bed upon PT arrival, pt asleep and opens eyes briefly to therapists verbal and tactile stimuli. Therapist encouraging pt to sit EOB and get up for therapy. Pt slow to wake up and initiate movement, therapist begins to assist pt from supine>sitting by swinging legs off the bed and encouraging pt to reach for bedrails. Pt unable to transfer to sitting EOB despite max assist from therapist, pt continues closing his eyes and asking to nap. Therapist assisted pt back to comfortable position in supine, pt missed 20 minutes of skilled therapy tx secondary to fatigue. RN reports pt just got back to bed prior to PT arrival, in order to be in/out cathed.    Therapy Documentation Precautions:  Precautions Precautions: Fall Restrictions Weight Bearing Restrictions: No   Therapy/Group: Individual Therapy  Cresenciano Genre, PT, DPT 08/11/2018, 7:55 AM

## 2018-08-11 NOTE — Progress Notes (Signed)
Speech Language Pathology Daily Session Note  Patient Details  Name: [name] Frank MRN: [id number] Date of Birth: September 06, 1950  Today's Date: 08/11/2018 SLP Individual Time: 0805-0859 SLP Individual Time Calculation (min): 54 min  Short Term Goals: Week 2: SLP Short Term Goal 1 (Week 2): Pt will initiate functional task in 3 out of 5 opportunities with Mod A cues.  SLP Short Term Goal 2 (Week 2): Pt will demonstrate selective attention in mildly distracting environment for ~ 5 minutes with Mod A cues.  SLP Short Term Goal 3 (Week 2): Pt will solve simple problem solving tasks with Mod A cues.  SLP Short Term Goal 4 (Week 2): Pt will follow simple 2-step directions in 3 out of 5 opportunities with Mod A cues.   Skilled Therapeutic Interventions: Skilled ST services focused on cogntive skills. SLP facilitated following commands during bed mobility for appropriate PO consumption. Pt demonstarted ability to initiate PO consumption given extra time. Pt demonstrated ability to name 10 out 10 items on tray with 40% semantic, sentence completion and phonemic cues.Pt continued to demonstrated delay in verbal response requiring extra time and repetition cues.  Pt demonstrated ability to preform basic problem solving navigating breakfast tray with min A verbal cues, SLP providing assistance  In cutting meat. Pt did not attempt to verbalize need for help and overall no spontaneous verbalization. Pt demonstrated ability to self-feed with extra time. Pt responded to verbal questions in 1-3 words with min A verbal cuing initially (first 15 minutes) increased to max A verbal cues as the session continued suggest due to fatigue.  Pt pressed call bello twice in session and was only able to verbalize why via yes/no questions despite max A verbal cues including binary choices, pt belived it was the "tv" button. SLP provided education of call bell use and functionPt again 10 minutes later pressed the call bell and when  questions believed it to be the "tv" button. SLP facilitated basic problem solving skills in sorting change/money requiring max A verbal cues, and named amount of cions in 3 out 4 opportunities, however required total A to demonstrated request amount including with written cues to aid in recall. Pt was left in room with call bell within reach and bed alarm set. ST recommends to continue skilled ST services.      Pain Pain Assessment Pain Scale: 0-10 Pain Score: 0-No pain Faces Pain Scale: Hurts a little bit  Therapy/Group: Individual Therapy    [name]  [facility] 08/11/2018, 12:25 PM

## 2018-08-11 NOTE — Progress Notes (Signed)
[place] PHYSICAL MEDICINE & REHABILITATION PROGRESS NOTE   Subjective/Complaints:    Review of systems: Appears to deny chest pain, shortness of breath, nausea, vomiting, diarrhea.  Objective:   No results found. No results for input(s): WBC, HGB, HCT, PLT in the last 72 hours. No results for input(s): NA, K, CL, CO2, GLUCOSE, BUN, CREATININE, CALCIUM in the last 72 hours.  Intake/Output Summary (Last 24 hours) at 08/11/2018 0732 Last data filed at 08/10/2018 2100 Gross per 24 hour  Intake 560 ml  Output 500 ml  Net 60 ml     Physical Exam: Vital Signs Blood pressure 114/66, pulse (!) 55, temperature 97.6 F (36.4 C), temperature source Oral, resp. rate 16, weight 95.1 kg, SpO2 100 %. Constitutional: No distress . Vital signs reviewed. HENT: Normocephalic.  Atraumatic. Eyes: EOMI.  No discharge. Cardiovascular: No JVD. Respiratory: Normal effort. GI: Non-distended. Musc: No edema or tenderness in extremities. Neurologic: Alert Motor: 4/5 throughout Patient: Appears to have normal mood and affect.  Assessment/Plan: 1. Functional deficits secondary to left subdural hematoma, subarachnoid hemorrhage following a fall at home which require 3+ hours per day of interdisciplinary therapy in a comprehensive inpatient rehab setting.  Physiatrist is providing close team supervision and 24 hour management of active medical problems listed below.  Physiatrist and rehab team continue to assess barriers to discharge/monitor patient progress toward functional and medical goals  Care Tool:  Bathing  Bathing activity did not occur: Refused Body parts bathed by patient: Right arm, Left arm, Chest, Abdomen, Front perineal area, Face, Right upper leg, Left upper leg   Body parts bathed by helper: Buttocks, Right lower leg, Left lower leg Body parts n/a: Right upper leg, Left upper leg, Right lower leg, Left lower leg   Bathing assist Assist Level: Moderate Assistance - Patient 50 -  74%     Upper Body Dressing/Undressing Upper body dressing   What is the patient wearing?: Button up shirt    Upper body assist Assist Level: Minimal Assistance - Patient > 75%    Lower Body Dressing/Undressing Lower body dressing      What is the patient wearing?: Incontinence brief, Pants     Lower body assist Assist for lower body dressing: Moderate Assistance - Patient 50 - 74%     Toileting Toileting    Toileting assist Assist for toileting: Moderate Assistance - Patient 50 - 74%     Transfers Chair/bed transfer  Transfers assist     Chair/bed transfer assist level: Minimal Assistance - Patient > 75%     Locomotion Ambulation   Ambulation assist      Assist level: Minimal Assistance - Patient > 75% Assistive device: Walker-rolling Max distance: 25 ft   Walk 10 feet activity   Assist     Assist level: Minimal Assistance - Patient > 75% Assistive device: Walker-rolling   Walk 50 feet activity   Assist Walk 50 feet with 2 turns activity did not occur: Safety/medical concerns         Walk 150 feet activity   Assist Walk 150 feet activity did not occur: Safety/medical concerns         Walk 10 feet on uneven surface  activity   Assist Walk 10 feet on uneven surfaces activity did not occur: Safety/medical concerns         Wheelchair     Assist Will patient use wheelchair at discharge?: No   Wheelchair activity did not occur: N/A  Wheelchair 50 feet with 2 turns activity    Assist    Wheelchair 50 feet with 2 turns activity did not occur: N/A       Wheelchair 150 feet activity     Assist Wheelchair 150 feet activity did not occur: N/A          Medical Problem List and Plan: 1.Functional declinesecondary toAMS, 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 Exhibits signs of aphasia, poor activation trial ritalin - overall  improving  Continue CIR 2. Antithrombotics: -DVT/anticoagulation:Mechanical:Sequential compression devices, below kneeBilateral lower extremities -antiplatelet therapy: N/A 3. Pain Management:Tylenol prn. 4. Mood:LCSW to follow for evaluation and support. -antipsychotic agents: N/A 5. Neuropsych: This patientis notcapable of making decisions on hisown behalf. 6. Skin/Wound Care:routine pressure relief measures. 7. Fluids/Electrolytes/Nutrition:Monitor I/Os. 8. T2DM: Monitor BS ac/hs. SSI for elevated BS.Adjust regimen as needed.  Glucotrol 2.5 added on 5/30   Remains labile and elevated on 5/31 CBG (last 3)  Recent Labs    08/10/18 1713 08/10/18 2130 08/11/18 0631  GLUCAP 163* 229* 179*   9. CAD/PAF/A flutter: Off Plavix due to SDH. Monitor HR bid. Continue Cardizem CD and Toprol XL. Will order orthostatic vitals due to reports of dizziness. Ordered TEDs. Continue to hold Losartan.Pt with regular rhythm at present Vitals:   08/10/18 2003 08/11/18 0436  BP: 135/73 114/66  Pulse: (!) 53 (!) 55  Resp: 15 16  Temp: (!) 97.5 F (36.4 C) 97.6 F (36.4 C)  SpO2: 95% 100%   Reduced cardizem to 300mg  per day due to bradycardia and soft BP, goal is HR>50bpm-borderline at present 10. Left > Right hydrocele: On Augmentin, will d/c 11. AKI with hyponateremia: Resolved   12. New onset seizures: Continue Keppra 750 mg bid.Overly sedated on 1000mg    Keppra has been increased due to suspected seizure activity but no observe seizures 13.  Urinary retention: Continue Flomax monitor for orthostatic hypotension  LOS: 10 days A FACE TO FACE EVALUATION WAS PERFORMED  Erick Colacendrew E Kirsteins 08/11/2018, 7:32 AM

## 2018-08-11 NOTE — Progress Notes (Signed)
Occupational Therapy Session Note  Patient Details  Name: [name] Frank MRN: [id number] Date of Birth: 03-16-1950  Today's Date: 08/11/2018 OT Individual Time: 8185-6314 OT Individual Time Calculation (min): 43 min    Short Term Goals: Week 2:  OT Short Term Goal 1 (Week 2): Continue working on established goals set at supervision level overall.   Skilled Therapeutic Interventions/Progress Updates:    Pt greeted semi-reclined in bed and agreeable to OT treatment session. Pt needed increased time and min A to initiate bed mobility. Pt then attempted sit<>stand w/ RW from EOB with posterior lean and difficulty powering up. Pt able to achieve stand on second attempt but continued to have posterior lean requiring max A to correct. Pt then pivoted over to wc w/ RW, but again had posterior LOB when trying to sit in wc and needed max A to sit safely. Pt very frustrated after this shaking fists and yelling. Pt calmed down and agreeable to bathing/dressing at the sink. Worked on UB bathing with focus on anterior weight shift to turn on water and wring out wash cloths. Verbal cues for initiation. Pt with posterior LOB throughout bathing tasks and needed cues to keep  B LEs on the floor as he likes to cross his feet which affect his balance as welll. Pt declined to wash LB but wanted to don pants. Min A to thread pant legs, then min A to stand and pull them up with improved balance. Pt noted to have smear of BM in brief so OT asked if he needed to go to the bathroom and pt stated "yes." Stand-pivot to toilet with use of grab bars and min A. Min A for clothing management. Pt with more BM in commode. Worked on pt completing toileting tasks, but needed OT assist for thoroughness. Min A for UB dressing as pt forgot to thread L UE through shirt sleeve. Pt left seated in wc at end of session with call bell in reach and needs met.   Therapy Documentation Precautions:  Precautions Precautions:  Fall Restrictions Weight Bearing Restrictions: No Pain: Pain Assessment Pain Score: 0-No pain   Therapy/Group: Individual Therapy  Daneen Schick Shalise Rosado 08/11/2018, 10:00 AM

## 2018-08-12 ENCOUNTER — Inpatient Hospital Stay (HOSPITAL_COMMUNITY): Payer: Medicare HMO

## 2018-08-12 ENCOUNTER — Inpatient Hospital Stay (HOSPITAL_COMMUNITY): Payer: Medicare HMO | Admitting: Occupational Therapy

## 2018-08-12 LAB — GLUCOSE, CAPILLARY
Glucose-Capillary: 179 mg/dL — ABNORMAL HIGH (ref 70–99)
Glucose-Capillary: 150 mg/dL — ABNORMAL HIGH (ref 70–99)
Glucose-Capillary: 192 mg/dL — ABNORMAL HIGH (ref 70–99)
Glucose-Capillary: 255 mg/dL — ABNORMAL HIGH (ref 70–99)

## 2018-08-12 NOTE — Progress Notes (Signed)
Physical Therapy Session Note  Patient Details  Name: [name] Frank MRN: [id number] Date of Birth: Jan 01, 1951  Today's Date: 08/12/2018 PT Individual Time: 1016-1115 PT Individual Time Calculation (min): 59 min   Short Term Goals: Week 2:  PT Short Term Goal 1 (Week 2): STG=LTG due to ELOS  Skilled Therapeutic Interventions/Progress Updates:    Pt supine in bed asleep upon PT arrival, pt opens eyes to verbal and tactile stimuli. Pt reports being tired. Pt agreeable to therapy tx. Therapist assisted to loop LEs through shorts, pt transferred to sitting EOB with max assist, slow to initiate movement and increased time to complete with poor motor planning and strong posterior lean. Pt seated EOB with mod assist initially to correct posterior lean, able to progress to supervision sitting balance with cues.Pt performed sit<>stand with mod assist, mod assist for standing balance with RW while therapist pulled pants over hips total assist. Pt performed stand pivot to w/c with RW and mod assist, decreased time to initiate movement and increased time to complete, cues for foot placement and safety awareness. Pt transported to the gym, worked on cognitive remediation including attention while also working on standing balance at high table. Pt worked on peg board puzzles while standing, pt completed simple red/blue design and then a cross design, verbal cues for attention to task and visual cues from therapist pointing to picture to prompt pt for the next color. Pt ambulated 2 x 20 ft this session working on gait and increased step length, visual target given for increased L step length and foot clearance, min assist during ambulation in a straight line, mod-max assist during turn with posterior LOB back onto mat. Pt transported back to room and left seated in w/c with needs in reach and chair alarm set.   Therapy Documentation Precautions:  Precautions Precautions: Fall Restrictions Weight Bearing  Restrictions: No    Therapy/Group: Individual Therapy  Cresenciano Genre, PT, DPT 08/12/2018, 7:56 AM

## 2018-08-12 NOTE — Progress Notes (Addendum)
Physical Therapy Session Note  Patient Details  Name: [name] Frank MRN: [id number] Date of Birth: 08/02/1950  Today's Date: 08/12/2018 PT Individual Time: 1512-1536 PT Individual Time Calculation (min): 24 min   Short Term Goals: Week 2:  PT Short Term Goal 1 (Week 2): STG=LTG due to ELOS  Skilled Therapeutic Interventions/Progress Updates:  Pt seen to make up time for previous date. Pt received in bed & agreeable to tx, denying c/o pain. Pt requires multiple attempts and ultimately mod assist to upright trunk for supine>sit with use of hospital bed features. Therapist threads shorts on BLE and pt transfers sit>stand with min assist and is able to pull pants over hips & manage buttons/zipper. Pt reports need to use restroom and ambulates into bathroom & in room with RW & mod assist with L lateral lean, decreased weight shifting to RLE, shuffled gait (poor<>absent BLE foot clearance, no heel strike, decreased dorsiflexion BLE, decreased hip/knee flexion during swing phase). Pt transfers onto toilet with min assist & requires multiple scoots back to ensure pt is over toilet. Pt requires extra time on toilet and sink on but unable to void. Pt ambulates back to bed with therapist attempting to facilitate weight shifting R. Pt with overall decreased attention to LLE. From EOB pt performs 10x sit<>stand with BUE & mod assist with cuing for anterior weight shifting with pt beginning to demonstrate poor eccentric control when sitting. Pt transfers sit>supine with CGA with ongoing tactile cuing to scoot BLE to center of bed. Pt left in bed with alarm set & call bell in reach.  Therapy Documentation Precautions:  Precautions Precautions: Fall Restrictions Weight Bearing Restrictions: No    Therapy/Group: Individual Therapy  Sandi Mariscal 08/12/2018, 3:41 PM

## 2018-08-12 NOTE — Progress Notes (Signed)
Speech Language Pathology Daily Session Note  Patient Details  Name: ERVEY SCHUR MRN: [id number] Date of Birth: 1951-01-13  Today's Date: 08/12/2018 SLP Individual Time: 0830-0930 SLP Individual Time Calculation (min): 60 min  Short Term Goals: Week 2: SLP Short Term Goal 1 (Week 2): Pt will initiate functional task in 3 out of 5 opportunities with Mod A cues.  SLP Short Term Goal 2 (Week 2): Pt will demonstrate selective attention in mildly distracting environment for ~ 5 minutes with Mod A cues.  SLP Short Term Goal 3 (Week 2): Pt will solve simple problem solving tasks with Mod A cues.  SLP Short Term Goal 4 (Week 2): Pt will follow simple 2-step directions in 3 out of 5 opportunities with Mod A cues.   Skilled Therapeutic Interventions:Skilled ST services focused on cognitive skills. SLP facilitated initiation and basic problem solving skills utilizing selecting same object among 8 objects in "spot it" task, pt required total A to selection same object, however was able to idenitfy object given yes/no questions in 11 out 12 opportunities. SLP facilitated initation and expanding verbal output, pt required max A verbal cues to describe simple pictures. SLP facilitated initaion and expressive naming of objects utilizing LARK toolkit, pt demonstrated ability to name 9 out 10 objects with max sentence completion, phonetic and binary choice cues and 2 out 10 objects independently on 1st trial, following instruction pt demonstarted ability to name 3 out 10 independently on 2nd trial. Pt initiated functional action with mod A verbal cues and max A verbal cues for verbal output. Pt was left in room with call bell within reach and bed alarm set. ST recommends to continue skilled ST services.      Pain Pain Assessment Pain Scale: Faces Pain Score: 0-No pain  Therapy/Group: Individual Therapy  Katharyn Schauer  [facility] 08/12/2018, 4:24 PM

## 2018-08-12 NOTE — Progress Notes (Signed)
[place] PHYSICAL MEDICINE & REHABILITATION PROGRESS NOTE   Subjective/Complaints:  No issues overnite   Review of systems: Appears to deny chest pain, shortness of breath, nausea, vomiting, diarrhea.  Objective:   No results found. No results for input(s): WBC, HGB, HCT, PLT in the last 72 hours. No results for input(s): NA, K, CL, CO2, GLUCOSE, BUN, CREATININE, CALCIUM in the last 72 hours.  Intake/Output Summary (Last 24 hours) at 08/12/2018 0718 Last data filed at 08/12/2018 0415 Gross per 24 hour  Intake 110 ml  Output 950 ml  Net -840 ml     Physical Exam: Vital Signs Blood pressure 130/64, pulse (!) 56, temperature 97.7 F (36.5 C), temperature source Oral, resp. rate 18, weight 95.1 kg, SpO2 99 %. Constitutional: No distress . Vital signs reviewed. HENT: Normocephalic.  Atraumatic. Eyes: EOMI.  No discharge. Cardiovascular: No JVD. Respiratory: Normal effort. GI: Non-distended. Musc: No edema or tenderness in extremities. Neurologic: Alert Motor: 4/5 throughout Patient: Appears to have normal mood and affect.  Assessment/Plan: 1. Functional deficits secondary to left subdural hematoma, subarachnoid hemorrhage following a fall at home which require 3+ hours per day of interdisciplinary therapy in a comprehensive inpatient rehab setting.  Physiatrist is providing close team supervision and 24 hour management of active medical problems listed below.  Physiatrist and rehab team continue to assess barriers to discharge/monitor patient progress toward functional and medical goals  Care Tool:  Bathing  Bathing activity did not occur: Refused Body parts bathed by patient: Right arm, Left arm, Chest, Abdomen, Front perineal area, Face, Right upper leg, Left upper leg   Body parts bathed by helper: Buttocks, Right lower leg, Left lower leg Body parts n/a: Right upper leg, Left upper leg, Right lower leg, Left lower leg   Bathing assist Assist Level: Moderate  Assistance - Patient 50 - 74%     Upper Body Dressing/Undressing Upper body dressing   What is the patient wearing?: Button up shirt    Upper body assist Assist Level: Minimal Assistance - Patient > 75%    Lower Body Dressing/Undressing Lower body dressing      What is the patient wearing?: Incontinence brief, Pants     Lower body assist Assist for lower body dressing: Moderate Assistance - Patient 50 - 74%     Toileting Toileting    Toileting assist Assist for toileting: Moderate Assistance - Patient 50 - 74%     Transfers Chair/bed transfer  Transfers assist     Chair/bed transfer assist level: Minimal Assistance - Patient > 75%     Locomotion Ambulation   Ambulation assist      Assist level: Moderate Assistance - Patient 50 - 74% Assistive device: No Device Max distance: 38 ft   Walk 10 feet activity   Assist     Assist level: Moderate Assistance - Patient - 50 - 74% Assistive device: No Device   Walk 50 feet activity   Assist Walk 50 feet with 2 turns activity did not occur: Safety/medical concerns         Walk 150 feet activity   Assist Walk 150 feet activity did not occur: Safety/medical concerns         Walk 10 feet on uneven surface  activity   Assist Walk 10 feet on uneven surfaces activity did not occur: Safety/medical concerns         Wheelchair     Assist Will patient use wheelchair at discharge?: No   Wheelchair activity did not occur:  N/A         Wheelchair 50 feet with 2 turns activity    Assist    Wheelchair 50 feet with 2 turns activity did not occur: N/A       Wheelchair 150 feet activity     Assist Wheelchair 150 feet activity did not occur: N/A          Medical Problem List and Plan: 1.Functional declinesecondary toAMS, 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 Exhibits signs of aphasia, poor activation trial  ritalin - overall improving  Team conf in am  2. Antithrombotics: -DVT/anticoagulation:Mechanical:Sequential compression devices, below kneeBilateral lower extremities -antiplatelet therapy: N/A 3. Pain Management:Tylenol prn. 4. Mood:LCSW to follow for evaluation and support. -antipsychotic agents: N/A 5. Neuropsych: This patientis notcapable of making decisions on hisown behalf. 6. Skin/Wound Care:routine pressure relief measures. 7. Fluids/Electrolytes/Nutrition:Monitor I/Os. 8. T2DM: Monitor BS ac/hs. SSI for elevated BS.Adjust regimen as needed.  Glucotrol 2.5 added on 5/30   Remains labile and elevated on 5/31 CBG (last 3)  Recent Labs    08/11/18 1641 08/11/18 2119 08/12/18 0624  GLUCAP 186* 166* 179*   9. CAD/PAF/A flutter: Off Plavix due to SDH. Monitor HR bid. Continue Cardizem CD and Toprol XL. Will order orthostatic vitals due to reports of dizziness. Ordered TEDs. Continue to hold Losartan.Pt with regular rhythm at present Vitals:   08/11/18 2020 08/12/18 0455  BP: 125/72 130/64  Pulse: (!) 57 (!) 56  Resp: 18 18  Temp: 98.7 F (37.1 C) 97.7 F (36.5 C)  SpO2: 96% 99%   Reduced cardizem to 300mg  per day due to bradycardia and soft BP, goal is HR>50bpm-staying in mid 50s10. Left > Right hydrocele:11. AKI with hyponateremia: Resolved   12. New onset seizures: Continue Keppra 750 mg bid.Overly sedated on 1000mg    Keppra has been increased due to suspected seizure activity but no observe seizures 13.  Urinary retention: Continue Flomax monitor for orthostatic hypotension  LOS: 11 days A FACE TO FACE EVALUATION WAS PERFORMED  [name] Frank [name] Frank 08/12/2018, 7:18 AM

## 2018-08-12 NOTE — Progress Notes (Signed)
Occupational Therapy Session Note  Patient Details  Name: [name] Frank MRN: [id number] Date of Birth: [birth year]  Today's Date: 08/12/2018 OT Individual Time: 1300-1415 OT Individual Time Calculation (min): 75 min    Short Term Goals: Week 2:  OT Short Term Goal 1 (Week 2): Continue working on established goals set at supervision level overall.   Skilled Therapeutic Interventions/Progress Updates:    Pt completed shaving while sitting at the sink with overall mod assist..  He also washed his face with setup assist.  UB dressing was completed with min assist overall.  He was able to fasten the buttons but did not have them aligned properly to start with.  Pt talked with his wife via phone as well as this therapist discussing current level of assist and slower progress with regards to cognition and ADL performance.  She voiced understanding.  Finished session with transfer back to the bed with pt left with call button and phone in reach and bed alarm in place.  Pt still with decreased ability to complete statements or answer questions that therapist or wife would ask him.  Mod instructional cueing to sequence through simple selfcare tasks.     Therapy Documentation Precautions:  Precautions Precautions: Fall Restrictions Weight Bearing Restrictions: No   Pain: Pain Assessment Pain Scale: Faces Pain Score: 0-No pain ADL: See Care Tool Section for some details of ADL  Therapy/Group: Individual Therapy  Marixa Mellott OTR/L 08/12/2018, 2:40 PM

## 2018-08-13 ENCOUNTER — Inpatient Hospital Stay (HOSPITAL_COMMUNITY): Payer: Medicare HMO | Admitting: Speech Pathology

## 2018-08-13 ENCOUNTER — Inpatient Hospital Stay (HOSPITAL_COMMUNITY): Payer: Medicare HMO | Admitting: Physical Therapy

## 2018-08-13 ENCOUNTER — Inpatient Hospital Stay (HOSPITAL_COMMUNITY): Payer: Medicare HMO | Admitting: Occupational Therapy

## 2018-08-13 LAB — GLUCOSE, CAPILLARY
Glucose-Capillary: 200 mg/dL — ABNORMAL HIGH (ref 70–99)
Glucose-Capillary: 211 mg/dL — ABNORMAL HIGH (ref 70–99)
Glucose-Capillary: 198 mg/dL — ABNORMAL HIGH (ref 70–99)
Glucose-Capillary: 232 mg/dL — ABNORMAL HIGH (ref 70–99)

## 2018-08-13 MED ORDER — DILTIAZEM HCL ER COATED BEADS 240 MG PO CP24
240.0000 mg | ORAL_CAPSULE | Freq: Every day | ORAL | Status: DC
  Administered 2018-08-14 – 2018-08-17 (×4): 240 mg via ORAL
  Filled 2018-08-13 (×5): qty 1

## 2018-08-13 MED ORDER — METHYLPHENIDATE HCL 5 MG PO TABS
10.0000 mg | ORAL_TABLET | Freq: Two times a day (BID) | ORAL | Status: DC
  Administered 2018-08-13 – 2018-08-22 (×19): 10 mg via ORAL
  Filled 2018-08-13 (×19): qty 2

## 2018-08-13 NOTE — Progress Notes (Signed)
Speech Language Pathology Daily Session Note  Patient Details  Name: [name] Frank MRN: [id number] Date of Birth: January 09, 1951  Today's Date: 08/13/2018 SLP Individual Time: 1005-1100 SLP Individual Time Calculation (min): 55 min  Short Term Goals: Week 2: SLP Short Term Goal 1 (Week 2): Pt will initiate functional task in 3 out of 5 opportunities with Mod A cues.  SLP Short Term Goal 2 (Week 2): Pt will demonstrate selective attention in mildly distracting environment for ~ 5 minutes with Mod A cues.  SLP Short Term Goal 3 (Week 2): Pt will solve simple problem solving tasks with Mod A cues.  SLP Short Term Goal 4 (Week 2): Pt will follow simple 2-step directions in 3 out of 5 opportunities with Mod A cues.   Skilled Therapeutic Interventions: Pt was seen for skilled ST intervention targeting goals for improved cognition, including initiation, attention, and problem solving. SLP facilitated session by providing common objects for naming activity. Pt was 71% accurate for task, with accuracy increasing to 80% with lead in phrase. SLP continued to facilitate initiation and problem solving skills with task from previous ST session. Pt matched object [facility]) with 80% accuracy given mod verbal and question cues. Pt followed 2-step verbal commands with 33% accuracy. SLP asked pt to repeat the direction to facilitate recall and execution, however, this was not successful for improving accuracy of direction following. Pt initiated drinking from the straw on 2 occasions during this session. Pt indicated he enjoyed reading the [employer] (on his phone) prior to admit. Pt was able to acquire his phone, and enter the passcode, but then required max assist to find search engine, then type correctly.   Toward the end of the session, pt was noted to try and remove safety lap belt. He was easily redirected, and when asked what he needed, pt responded with "I'm going back to bed". Pt indicated he was able to get  in bed independently, but was encouraged to stay in his chair, as his next therapist would be there momentarily. Pt was left in chair with alarm on, all needs within reach. Continue ST per current plan of care.  Pain Pain Assessment Pain Scale: 0-10 Pain Score: 0-No pain  Therapy/Group: Individual Therapy   Celia B. Murvin Natal, [facility], CCC-SLP Speech Language Pathologist  Leigh Aurora 08/13/2018, 1:04 PM

## 2018-08-13 NOTE — Progress Notes (Signed)
[place] PHYSICAL MEDICINE & REHABILITATION PROGRESS NOTE   Subjective/Complaints:  Cont to require mod A fo ramb, poor attn , cognitive delay   Review of systems: Appears to deny chest pain, shortness of breath, nausea, vomiting, diarrhea.  Objective:   No results found. No results for input(s): WBC, HGB, HCT, PLT in the last 72 hours. No results for input(s): NA, K, CL, CO2, GLUCOSE, BUN, CREATININE, CALCIUM in the last 72 hours.  Intake/Output Summary (Last 24 hours) at 08/13/2018 0803 Last data filed at 08/13/2018 0510 Gross per 24 hour  Intake 555 ml  Output 550 ml  Net 5 ml     Physical Exam: Vital Signs Blood pressure (!) 107/55, pulse (!) 54, temperature 97.7 F (36.5 C), temperature source Oral, resp. rate 17, weight 95.1 kg, SpO2 96 %. Constitutional: No distress . Vital signs reviewed. HENT: Normocephalic.  Atraumatic. Eyes: EOMI.  No discharge. Cardiovascular: No JVD. Respiratory: Normal effort. GI: Non-distended. Musc: No edema or tenderness in extremities. Neurologic: Alert Motor: 4/5 throughout Patient: Appears to have normal mood and affect.  Assessment/Plan: 1. Functional deficits secondary to left subdural hematoma, subarachnoid hemorrhage following a fall at home which require 3+ hours per day of interdisciplinary therapy in a comprehensive inpatient rehab setting.  Physiatrist is providing close team supervision and 24 hour management of active medical problems listed below.  Physiatrist and rehab team continue to assess barriers to discharge/monitor patient progress toward functional and medical goals  Care Tool:  Bathing  Bathing activity did not occur: Refused Body parts bathed by patient: Right arm, Left arm, Chest, Abdomen, Front perineal area, Face, Right upper leg, Left upper leg   Body parts bathed by helper: Buttocks, Right lower leg, Left lower leg Body parts n/a: Right upper leg, Left upper leg, Right lower leg, Left lower leg    Bathing assist Assist Level: Moderate Assistance - Patient 50 - 74%     Upper Body Dressing/Undressing Upper body dressing   What is the patient wearing?: Button up shirt    Upper body assist Assist Level: Minimal Assistance - Patient > 75%    Lower Body Dressing/Undressing Lower body dressing      What is the patient wearing?: Incontinence brief, Pants     Lower body assist Assist for lower body dressing: Moderate Assistance - Patient 50 - 74%     Toileting Toileting    Toileting assist Assist for toileting: Moderate Assistance - Patient 50 - 74%     Transfers Chair/bed transfer  Transfers assist     Chair/bed transfer assist level: Minimal Assistance - Patient > 75%     Locomotion Ambulation   Ambulation assist      Assist level: Moderate Assistance - Patient 50 - 74% Assistive device: Walker-rolling Max distance: 20 ft   Walk 10 feet activity   Assist     Assist level: Moderate Assistance - Patient - 50 - 74% Assistive device: Walker-rolling   Walk 50 feet activity   Assist Walk 50 feet with 2 turns activity did not occur: Safety/medical concerns         Walk 150 feet activity   Assist Walk 150 feet activity did not occur: Safety/medical concerns         Walk 10 feet on uneven surface  activity   Assist Walk 10 feet on uneven surfaces activity did not occur: Safety/medical concerns         Wheelchair     Assist Will patient use wheelchair at discharge?:  No   Wheelchair activity did not occur: N/A         Wheelchair 50 feet with 2 turns activity    Assist    Wheelchair 50 feet with 2 turns activity did not occur: N/A       Wheelchair 150 feet activity     Assist Wheelchair 150 feet activity did not occur: N/A          Medical Problem List and Plan: 1.Functional declinesecondary toAMS, 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  Exhibits signs of aphasia, poor activation trial ritalin - increase to 56m   Team conference today please see physician documentation under team conference tab, met with team face-to-face to discuss problems,progress, and goals. Formulized individual treatment plan based on medical history, underlying problem and comorbidities.  2. Antithrombotics: -DVT/anticoagulation:Mechanical:Sequential compression devices, below kneeBilateral lower extremities -antiplatelet therapy: N/A 3. Pain Management:Tylenol prn. 4. Mood:LCSW to follow for evaluation and support. -antipsychotic agents: N/A 5. Neuropsych: This patientis notcapable of making decisions on hisown behalf. 6. Skin/Wound Care:routine pressure relief measures. 7. Fluids/Electrolytes/Nutrition:Monitor I/Os. 8. T2DM: Monitor BS ac/hs. SSI for elevated BS.Adjust regimen as needed.  Glucotrol 2.5 added on 5/30 Increase to 534m Remains labile and elevated on6/3 CBG (last 3)  Recent Labs    08/12/18 1649 08/12/18 2126 08/13/18 0635  GLUCAP 192* 255* 200*   9. CAD/PAF/A flutter: Off Plavix due to SDH. Monitor HR bid. Continue Cardizem CD and Toprol XL. Will order orthostatic vitals due to reports of dizziness. Ordered TEDs. Continue to hold Losartan.Pt with regular rhythm at present Vitals:   08/12/18 1545 08/12/18 1952  BP: 120/64 (!) 107/55  Pulse: (!) 51 (!) 54  Resp: 18 17  Temp: 98.7 F (37.1 C) 97.7 F (36.5 C)  SpO2: 98% 96%   Reduced cardizem to 30064mer day due to bradycardia and soft BP, goal is HR>50bpm-staying in mid 50s may reduce to 240m45m. Left > Right hydrocele:11. AKI with hyponateremia: Resolved   12. New onset seizures: Continue Keppra 750 mg bid.Overly sedated on 1000mg47meppra has been increased due to suspected seizure activity but no observe seizures 13.  Urinary retention: Continue Flomax monitor for orthostatic hypotension  LOS: 12 days A FACE TO FACE EVALUATION  WAS PERFORMED  AndreCharlett Blake2020, 8:03 AM

## 2018-08-13 NOTE — Progress Notes (Signed)
Occupational Therapy Session Note  Patient Details  Name: [name] Frank MRN: [id number] Date of Birth: [birth year]  Today's Date: 08/13/2018 OT Individual Time: 1100-1200 OT Individual Time Calculation (min): 60 min    Short Term Goals: Week 2:  OT Short Term Goal 1 (Week 2): Continue working on established goals set at supervision level overall.   Skilled Therapeutic Interventions/Progress Updates:    Patient seated in w/c, flat affect and slow to respond but cooperative and agreeable to therapy session.  Ambulation with RW in room w/c to toilet and for grooming at sink with min A and cues for walker management and weight shifts to the right - continues with shuffling gait and poor clearance of left foot.  Sit to stand and SPT to/from w/c, commode, toilet with min A and cues for forward weight shift.  Completed seated and standing reaction time activity with dynavision:  Seated - 1 minute (bottom half) trial 1 = 2.4 sec, 2 = 2.3 sec, in stance - 2 minutes (whole screen) trial 1 = 3.87 sec, 2 = 3.43 sec.  Completed standing activity with "hangman" word game on dry erase board - patient unable to recall letters already chosen - max/dependent to come up with correct solution.  Patient remained in w/c at close of session with seat belt alarm set and call bell in reach.    Therapy Documentation Precautions:  Precautions Precautions: Fall Restrictions Weight Bearing Restrictions: No General:   Vital Signs:  Pain: Pain Assessment Pain Scale: 0-10 Pain Score: 0-No pain Other Treatments:     Therapy/Group: Individual Therapy  [name] 08/13/2018, 12:40 PM

## 2018-08-13 NOTE — Patient Care Conference (Signed)
Inpatient RehabilitationTeam Conference and Plan of Care Update Date: 08/13/2018   Time: 11:15 AM    Patient Name: [name] Frank      Medical Record Number: [id number]  Date of Birth: [birth year] Sex: Male         Room/Bed: 4W12C/4W12C-01 Payor Info: Payor: [employer] MEDICARE / Plan: [employer] MEDICARE HMO / Product Type: *No Product type* /    Admitting Diagnosis: CVA 2 Team  Closed TBI; 12-14days  Admit Date/Time:  08/01/2018  8:00 PM Admission Comments: No comment available   Primary Diagnosis:  <principal problem not specified> Principal Problem: <principal problem not specified>  Patient Active Problem List   Diagnosis Date Noted  . Bradycardia   . Leukopenia   . Hypoalbuminemia due to protein-calorie malnutrition (HCC)   . Urinary retention   . Hydrocele   . Diabetes mellitus type 2 in obese (HCC)   . Somnolence   . SDH (subdural hematoma) (HCC) 08/01/2018  . Hydrocephalus in adult [facility](HCC) 07/29/2018  . Hydrocephalus (HCC) 07/28/2018  . Mild renal insufficiency 07/28/2018  . Paroxysmal atrial fibrillation (HCC) 07/28/2018  . Subdural hematoma (HCC) 07/14/2018  . Hypertension, essential 01/16/2018  . Controlled type 2 diabetes mellitus without complication, without long-term current use of insulin (HCC) 01/16/2018  . Coronary artery disease involving native heart without angina pectoris 01/16/2018  . Diabetes mellitus type II, non insulin dependent (HCC) 01/13/2011    Expected Discharge Date: Expected Discharge Date: 08/23/18  Team Members Present: Physician leading conference: Dr. Claudette LawsAndrew Kirsteins Social Worker Present: Dossie DerBecky Silvana Holecek, LCSW Nurse Present: Other (comment)(Miranda Surles-RN) PT Present: Woodfin GanjaEmily Van Shagen, PT OT Present: Perrin MalteseJames McGuire, OT SLP Present: Colin BentonMadison Cratch, SLP PPS Coordinator present : Fae PippinMelissa Bowie     Current Status/Progress Goal Weekly Team Focus  Medical   lethargy improved but still problem with attention, occ I/O cath, no observed seizures   reduce falls, improve cognition  increase ritalin, cont keppra   Bowel/Bladder   In and Out Cath, Incontinent of bowel, Last BM 08/11/2018  Regain bladder function, Less episodes of bowel incontinence  Continue In and Out Cath as needed, Bowel training   Swallow/Nutrition/ Hydration             ADL's   supervision for UB bathing with min assist for dressing.  Min assist for LB bathing with mod assist for dressing tasks.  Transfers are currently min to mod assist as well.  Still with decreased ability to sequence through selfcare tasks as well as demonstrating decreased sustained attention to all tasks.   supervision overall  selfcare retraining, balance retraining, transfer training, cognitive retraining, pt/family education   Mobility   mod-max assist for bed mobility, mod assist for sit<>stands,min-mod assist for transfers and short distance gait with RW 10-30 ft  supervision  attention, awareness, OOB activity, balance, gait training   Communication             Safety/Cognition/ Behavioral Observations  Mod-Max A  Mod A -downgraded 6/3  expressive lanuage naming, expressing wants/needs, attention, basic problem solving, initation   Pain   No complaints of pain  Maintain pain scale less than 2   Assess pain and treat as needed    Skin   swollen scrotum  no skin breakdown   Assess Qshift       *See Care Plan and progress notes for long and short-term goals.     Barriers to Discharge  Current Status/Progress Possible Resolutions Date Resolved   Physician    Medical stability;Incontinence  slow to minimal progress  Cont rehab      Nursing  Incontinence;Hemodialysis               PT  Medical stability;Behavior                 OT                  SLP                SW                Discharge Planning/Teaching Needs:  Wife concenred due to lethargy and medical issues. Will come in for education when team needs her too. She is aware he will need 24 hr care at DC       Team Discussion:  Making slow progress in his therapies, better with alertness and attention. Still requires multiple cues and sequencing. Encouraging fluids only needed to be cath once usually at night. MD has increased ritalin to 10 mg per day hopefully will make more alert and able to participate in therapies. Need family education with wife to se if can provide the care he will require at DC  Revisions to Treatment Plan:  DC extended 6/13    Continued Need for Acute Rehabilitation Level of Care: The patient requires daily medical management by a physician with specialized training in physical medicine and rehabilitation for the following conditions: Daily direction of a multidisciplinary physical rehabilitation program to ensure safe treatment while eliciting the highest outcome that is of practical value to the patient.: Yes Daily medical management of patient stability for increased activity during participation in an intensive rehabilitation regime.: Yes Daily analysis of laboratory values and/or radiology reports with any subsequent need for medication adjustment of medical intervention for : Neurological problems;Mood/behavior problems;Nutritional problems   I attest that I was present, lead the team conference, and concur with the assessment and plan of the team. Teleconference held due to COVID 19   Candela Krul, Lemar Livings 08/13/2018, 12:34 PM

## 2018-08-13 NOTE — Progress Notes (Signed)
Physical Therapy Session Note  Patient Details  Name: [name] Frank MRN: [id number] Date of Birth: 12-26-1950  Today's Date: 08/13/2018 PT Individual Time: 6503-5465 PT Individual Time Calculation (min): 42 min   Short Term Goals: Week 1:  PT Short Term Goal 1 (Week 1): Pt will ambulate 25' w/ min assist using LRAD PT Short Term Goal 1 - Progress (Week 1): Met PT Short Term Goal 2 (Week 1): Pt will perform bed <>chair transfers w/ min assist PT Short Term Goal 2 - Progress (Week 1): Met PT Short Term Goal 3 (Week 1): Pt will demonstrate normal postural control during functional movements w/ min cues  PT Short Term Goal 3 - Progress (Week 1): Progressing toward goal PT Short Term Goal 4 (Week 1): Pt will follow 2-step commands w/o cues consistently during functional mobility  PT Short Term Goal 4 - Progress (Week 1): Progressing toward goal  Skilled Therapeutic Interventions/Progress Updates:  Pt seated in wheelchair on arrival with call light on. When asked what he was wanting he stated he was tired and wanted to go back to bed. Pt agreed to work with PT with plan to get to bed after session .  Pt performed sit<>stand transfers multiple times with session from wheelchair to/from RW with min guard to min assist, cues on hand placement and forward weight shifting. In standing with RW support oin 1st stand had pt engage in game of connect 4. Goal of game explained prior to play with pt repeat back of goal for 4 same color in a row. 3 total games played with cues to pt on strategy with leading questions on next best play ~50% with pt follow through about half that. Min guard to min assist for static balance with single UE support on walker. After a seaded rest break had pt work on single leg stance, weight shifting and increased foot clearance through alternating forward toe taps to color circles, then alternating cross toe taps to color circles. Mod/max cues to task/posture/stance position needed  intitially, progressing to min cues as task progressed. Min guard assist for balance with bil UE support on RW. After a short seated rest break the pt ambulated for ~10 feet in room (around bed to other side of bed) with RW, min assist and cues on posture, step length and walker position with gait. Min assist to sit safely to bed surface. Min assist to return to supine in bed with assist to lift/clear bed with LE's. Once pt was situated supine in bed pt performed the following ex's witt rest breaks and cues to task- bridging for 10 reps, lower trunk rotation left<>right x 10 reps each way and straight leg raises for 10 reps each leg, AAROM and minimal buttock clearance with bridges.  Pt left in bed with alarm on and all needs in reach. Pt spoke very few words with today's session, mostly giving an thumbs up or thumbs down. At end of session pt's spouse called with pt conversational with her via speaker phone.    Therapy Documentation Precautions:  Precautions Precautions: Fall Restrictions Weight Bearing Restrictions: No Vital Signs:  Pain: Pain Assessment Pain Scale: 0-10 Pain Score: 0-No pain    Therapy/Group: Individual Therapy  Willow Ora. PTA 08/13/2018, 1:15 PM

## 2018-08-13 NOTE — Progress Notes (Signed)
Occupational Therapy Session Note  Patient Details  Name: [name] Frank MRN: [id number] Date of Birth: February 25, 1951  Today's Date: 08/13/2018 OT Individual Time: 0800-0901 OT Individual Time Calculation (min): 61 min    Short Term Goals: Week 2:  OT Short Term Goal 1 (Week 2): Continue working on established goals set at supervision level overall.   Skilled Therapeutic Interventions/Progress Updates:    Pt completed bathing and dressing during session.  He needed mod assist for supine to sit and for functional mobility to the shower for bathing with use of the RW.  Short steps noted with therapist having to provide max instructional cueing for mobility as well as mod facilitation for advancement of the RW.  Mod assist with max instructional cueing for sequencing bathing and for thoroughness. Noted pt with increased lean to the right in sitting this session without attempts to correct.  Mod assist for transfer out to the wheelchair with use of the walker again.  Min assist for donning button up shirt with mod assist for donning brief and pants.  Total assist for donning gripper socks as well.  Pt not oriented to day of the week when asked three times, but was able to state hospital as well as reason for hospitalization.  Finished session with call button and phone in reach and safety alarm belt in place.   Therapy Documentation Precautions:  Precautions Precautions: Fall Restrictions Weight Bearing Restrictions: No  Pain: Pain Assessment Pain Scale: Faces Pain Score: 0-No pain ADL: See Care Tool Section for some details of ADL   Therapy/Group: Individual Therapy  Annamae Shivley  OTR/L 08/13/2018, 9:00 AM

## 2018-08-14 ENCOUNTER — Inpatient Hospital Stay (HOSPITAL_COMMUNITY): Payer: Medicare HMO | Admitting: Speech Pathology

## 2018-08-14 ENCOUNTER — Inpatient Hospital Stay (HOSPITAL_COMMUNITY): Payer: Medicare HMO | Admitting: Occupational Therapy

## 2018-08-14 ENCOUNTER — Inpatient Hospital Stay (HOSPITAL_COMMUNITY): Payer: Medicare HMO

## 2018-08-14 LAB — GLUCOSE, CAPILLARY
Glucose-Capillary: 203 mg/dL — ABNORMAL HIGH (ref 70–99)
Glucose-Capillary: 183 mg/dL — ABNORMAL HIGH (ref 70–99)
Glucose-Capillary: 205 mg/dL — ABNORMAL HIGH (ref 70–99)
Glucose-Capillary: 207 mg/dL — ABNORMAL HIGH (ref 70–99)

## 2018-08-14 MED ORDER — GLIPIZIDE 5 MG PO TABS
5.0000 mg | ORAL_TABLET | Freq: Every day | ORAL | Status: DC
  Administered 2018-08-15 – 2018-08-18 (×4): 5 mg via ORAL
  Filled 2018-08-14 (×4): qty 1

## 2018-08-14 NOTE — Progress Notes (Signed)
Speech Language Pathology Daily Session Note  Patient Details  Name: [name] Frank MRN: [id number] Date of Birth: 26-Oct-1950  Today's Date: 08/14/2018 SLP Individual Time: 0800-0900 SLP Individual Time Calculation (min): 60 min  Short Term Goals: Week 2: SLP Short Term Goal 1 (Week 2): Pt will initiate functional task in 3 out of 5 opportunities with Mod A cues.  SLP Short Term Goal 2 (Week 2): Pt will demonstrate selective attention in mildly distracting environment for ~ 5 minutes with Mod A cues.  SLP Short Term Goal 3 (Week 2): Pt will solve simple problem solving tasks with Mod A cues.  SLP Short Term Goal 4 (Week 2): Pt will follow simple 2-step directions in 3 out of 5 opportunities with Mod A cues.   Skilled Therapeutic Interventions:  Pt was seen for skilled ST intervention targeting goals for improved cognition, including attention, initiation, functional problem solving, and following verbal directions. SLP facilitated session by providing set up assist with breakfast, cutting food and opening containers. Better initiation with pt self feeding today in mildly distracting environment (door was open). Verbal cue x1 required to encourage pt to continue eating. This selective attention is improved compared to last week, when pt was consistently distracted by hall noise with door closed.   After breakfast, SLP engaged pt in conversation regarding pending DC home. Pt uses gestures frequently, and was encouraged several times during this session to verbalize his response instead. Pt indicated he enjoys swimming at home, and reports working for [employer] in [employer] for 10 years. SLP recalled his enjoyment of reading the [employer], to which pt responded "How did you know that?", indicating poor recall of yesterday's therapy session. Pt was given simple math word problem task (figuring shipping cost using information given), and was unable to read the question accurately  without mod visual and verbal cues for attention to left. Pt was unable to use the graph to determine how much the shipping charge would be based on the cost of the item. Pt oriented x4 today, but continues to exhibit significant cognitive deficits, including recall, functional problem solving, and awareness of deficits and functional impact. 24 hour supervision is recommended at DC.  Pt was left in bed with alarm on, all needs within reach. Continue ST per current plan of care.  Pain Pain Assessment Pain Scale: 0-10 Pain Score: 0-No pain  Therapy/Group: Individual Therapy   Juley Giovanetti B. Murvin Natal, [facility], CCC-SLP Speech Language Pathologist  Leigh Aurora 08/14/2018, 12:38 PM

## 2018-08-14 NOTE — Progress Notes (Signed)
Physical Therapy Session Note  Patient Details  Name: [name] Frank MRN: [id number] Date of Birth: 1950-04-16  Today's Date: 08/14/2018 PT Individual Time: 1400-1501 PT Individual Time Calculation (min): 61 min   Short Term Goals: Week 2:  PT Short Term Goal 1 (Week 2): STG=LTG due to ELOS  Skilled Therapeutic Interventions/Progress Updates:     Patient in w/c upon PT arrival. Patient alert and agreeable to PT session. Patient donned a button down shirt with mod A from PT, able to button 6/7 buttons. Donned B TED hose and tennis shoes with total A.   Therapeutic Activity: Transfers: Patient performed sit to/from stand x7 with min A using RW. Provided verbal cues for leaning far forward, needed several attempts sometimes to stand and encouraged big movement. Responds well to 1-2-3 count to stand.  Gait Training:  Patient ambulated 30 feet using RW with CGA-min A for balance and 40 feet with min A using HHA with LOB x2. Ambulated with shuffling gait, decreased step length on L, L posterior rotated pelvis, decreased step height, and significantly decreased gait speed. Stops intermittently and need cues to start ambulating again. Provided verbal cues for increased step length on L, B increased step height, and intermittnet 1-2-3 step to restart gait pattern. Began ambulating with larger steps with HHA, but slowly reduced step length with increased distance.   Wheelchair Mobility:  Patient propelled wheelchair 40 feet using B LE for strengthening and reciprocal pattern with min A for steering and initiation. Provided verbal cues for increased step length and scooting to the edge of the seat for increased LE leverage for pulling with hamstrings.  Neuromuscular Re-ed: Patient performed marching with RW in front of a mirror 2x10 bringing kneed up to the bar on the RW to promote increased step height with ambulation.  He performed standing balance 2x6 min in front of a mirror with single UE support  on the RW while solving multiplication problems written on the mirror. Started with double digits multiplied by double digits and he was able to answer 1/3. Simplified to single digits multiplied by single digits and he was able to do 3/4. Unable to figure out 5x8 and worked up from [employer], 5x6, 5x7, and then was able to determine 5x8. Unable to answer when only verbal provided the problem, needed it written down to solve. Patient is a retired Merchandiser, retail and would like to continue incorporating math skills into sessions when able.   Patient in w/c at end of session with breaks locked, seat belt alarm set, and all needs within reach.    Therapy Documentation Precautions:  Precautions Precautions: Fall Restrictions Weight Bearing Restrictions: No Pain: Pain Assessment Pain Scale: 0-10 Pain Score: 0-No pain    Therapy/Group: Individual Therapy  Najae Rathert L Marlayna Bannister PT, DPT  08/14/2018, 4:06 PM

## 2018-08-14 NOTE — Progress Notes (Signed)
Occupational Therapy Session Note  Patient Details  Name: [name] Frank MRN: [id number] Date of Birth: 10-Aug-1950  Today's Date: 08/14/2018 OT Individual Time: 7654-6503 OT Individual Time Calculation (min): 71 min    Short Term Goals: Week 2:  OT Short Term Goal 1 (Week 2): Continue working on established goals set at supervision level overall.   Skilled Therapeutic Interventions/Progress Updates:    Pt completed toilet transfer with mod assist to the bathroom from the EOB, with use of the RW for support.  Noted short choppy steps with mod instructional cueing needed for directing walker and aligning himself with the toilet.  He was unsuccessful with toileting attempt, so had him transfer out to the wheelchair with mod assist for grooming, bathing, and dressing tasks.  Pt not oriented to day of the week or month and demonstrated the need for max questioning cueing to identify the month when told "It's 5 months after January", which was his original guess.  He was able to state location and reason for being in the hospital however.  Mod instructional cueing for sequencing bathing including wringing out the washcloth.  Pt with poor emergent awareness that it was dripping down his gown.  He completed UB bathing with supervision and LB bathing with min assist.  Did not attempt washing his feet this session.  He complete UB dressing with min assist and then LB dressing with mod assist including brief and pants.  Max assist was needed for donning suspenders.  He completed combing his hair in sitting with setup and oral hygiene in standing with min assist.  Finished session with pt sitting up in the wheelchair and call button and phone in reach.    Therapy Documentation Precautions:  Precautions Precautions: Fall Restrictions Weight Bearing Restrictions: No   Pain: Pain Assessment Pain Scale: Faces Pain Score: 0-No pain ADL: See Care Tool for some details of ADL  Therapy/Group: Individual  Therapy  [name] 08/14/2018, 11:49 AM

## 2018-08-14 NOTE — Progress Notes (Signed)
[name] PHYSICAL MEDICINE & REHABILITATION PROGRESS NOTE   Subjective/Complaints:  Poor attn and concentration, increased ritalin to 10mg   reviewed CT results, evidence of SVD in additional to acute SDH Aphasia  Off plavix    Review of systems: Appears to deny chest pain, shortness of breath, nausea, vomiting, diarrhea.  Objective:   No results found. No results for input(s): WBC, HGB, HCT, PLT in the last 72 hours. No results for input(s): NA, K, CL, CO2, GLUCOSE, BUN, CREATININE, CALCIUM in the last 72 hours.  Intake/Output Summary (Last 24 hours) at 08/14/2018 0826 Last data filed at 08/14/2018 4431 Gross per 24 hour  Intake 300 ml  Output 325 ml  Net -25 ml     Physical Exam: Vital Signs Blood pressure 121/65, pulse 82, temperature 98.5 F (36.9 C), resp. rate 19, weight 89.5 kg, SpO2 96 %. Constitutional: No distress . Vital signs reviewed. HENT: Normocephalic.  Atraumatic. Eyes: EOMI.  No discharge. Cardiovascular: No JVD. Respiratory: Normal effort. GI: Non-distended. Musc: No edema or tenderness in extremities. Neurologic: Alert Motor: 4/5 throughout Patient: Appears to have normal mood and affect.  Assessment/Plan: 1. Functional deficits secondary to left subdural hematoma, subarachnoid hemorrhage following a fall at home which require 3+ hours per day of interdisciplinary therapy in a comprehensive inpatient rehab setting.  Physiatrist is providing close team supervision and 24 hour management of active medical problems listed below.  Physiatrist and rehab team continue to assess barriers to discharge/monitor patient progress toward functional and medical goals  Care Tool:  Bathing  Bathing activity did not occur: Refused Body parts bathed by patient: Right arm, Left arm, Chest, Abdomen, Front perineal area, Face, Right upper leg, Left upper leg   Body parts bathed by helper: Buttocks, Right lower leg, Left lower leg Body parts n/a: Right upper leg,  Left upper leg, Right lower leg, Left lower leg   Bathing assist Assist Level: Moderate Assistance - Patient 50 - 74%     Upper Body Dressing/Undressing Upper body dressing   What is the patient wearing?: Button up shirt    Upper body assist Assist Level: Minimal Assistance - Patient > 75%    Lower Body Dressing/Undressing Lower body dressing      What is the patient wearing?: Incontinence brief, Pants     Lower body assist Assist for lower body dressing: Moderate Assistance - Patient 50 - 74%     Toileting Toileting    Toileting assist Assist for toileting: Moderate Assistance - Patient 50 - 74%     Transfers Chair/bed transfer  Transfers assist     Chair/bed transfer assist level: Moderate Assistance - Patient 50 - 74%     Locomotion Ambulation   Ambulation assist      Assist level: Moderate Assistance - Patient 50 - 74% Assistive device: Walker-rolling Max distance: 10'   Walk 10 feet activity   Assist     Assist level: Moderate Assistance - Patient - 50 - 74% Assistive device: Walker-rolling   Walk 50 feet activity   Assist Walk 50 feet with 2 turns activity did not occur: Safety/medical concerns         Walk 150 feet activity   Assist Walk 150 feet activity did not occur: Safety/medical concerns         Walk 10 feet on uneven surface  activity   Assist Walk 10 feet on uneven surfaces activity did not occur: Safety/medical concerns         Wheelchair  Assist Will patient use wheelchair at discharge?: No   Wheelchair activity did not occur: N/A         Wheelchair 50 feet with 2 turns activity    Assist    Wheelchair 50 feet with 2 turns activity did not occur: N/A       Wheelchair 150 feet activity     Assist Wheelchair 150 feet activity did not occur: N/A          Medical Problem List and Plan: 1.Functional declinesecondary toAMS, new onset seizures and recent TBI(left frontal SDH,  right occipital SAH) with subsequent hydrocephalus. Exhibits signs of aphasia, poor activation trial ritalin - increase to 10mg     2. Antithrombotics: -DVT/anticoagulation:Mechanical:Sequential compression devices, below kneeBilateral lower extremities -antiplatelet therapy: N/A 3. Pain Management:Tylenol prn. 4. Mood:LCSW to follow for evaluation and support. -antipsychotic agents: N/A 5. Neuropsych: This patientis notcapable of making decisions on hisown behalf. Patient states he feels comfortable with his wife managing finances while he is incapable.  Patient certainly cannot manage his own finances as he is having problems even accessing familiar websites.  He is unable to perform simple math problem-solving  6. Skin/Wound Care:routine pressure relief measures. 7. Fluids/Electrolytes/Nutrition:Monitor I/Os. 8. T2DM: Monitor BS ac/hs. SSI for elevated BS.Adjust regimen as needed.  Glucotrol 2.5 added on 5/30 Increase to 5mg   Remains labile and elevated on6/3 CBG (last 3)  Recent Labs    08/13/18 1718 08/13/18 2127 08/14/18 0613  GLUCAP 198* 232* 203*   9. CAD/PAF/A flutter: Off Plavix due to SDH. Monitor HR bid. Continue Cardizem CD and Toprol XL. Will order orthostatic vitals due to reports of dizziness. Ordered TEDs. Continue to hold Losartan.Pt with regular rhythm at present Vitals:   08/14/18 0426 08/14/18 0500  BP: 125/61 121/65  Pulse: 79 82  Resp: 19   Temp: 98.5 F (36.9 C)   SpO2: 96%    Reduced cardizem to 300mg  per day due to bradycardia and soft BP, goal is HR>50bpm-staying in mid 50s may reduce to 240mg  10. Left > Right hydrocele:11. AKI with hyponateremia: Resolved   12. New onset seizures: Continue Keppra 750 mg bid.Overly sedated on 1000mg    Keppra has been increased due to suspected seizure activity but no observe seizures 13.  Urinary retention: Continue Flomax monitor for orthostatic hypotension  LOS: 13 days A FACE  TO FACE EVALUATION WAS PERFORMED  [name] Frank [name] Frank 08/14/2018, 8:26 AM

## 2018-08-15 ENCOUNTER — Inpatient Hospital Stay (HOSPITAL_COMMUNITY): Payer: Medicare HMO | Admitting: Physical Therapy

## 2018-08-15 ENCOUNTER — Inpatient Hospital Stay (HOSPITAL_COMMUNITY): Payer: Medicare HMO | Admitting: Speech Pathology

## 2018-08-15 ENCOUNTER — Inpatient Hospital Stay (HOSPITAL_COMMUNITY): Payer: Medicare HMO | Admitting: Occupational Therapy

## 2018-08-15 LAB — CBC
HCT: 38.6 % — ABNORMAL LOW (ref 39.0–52.0)
Hemoglobin: 12.6 g/dL — ABNORMAL LOW (ref 13.0–17.0)
MCH: 29.1 pg (ref 26.0–34.0)
MCHC: 32.6 g/dL (ref 30.0–36.0)
MCV: 89.1 fL (ref 80.0–100.0)
Platelets: 113 10*3/uL — ABNORMAL LOW (ref 150–400)
RBC: 4.33 MIL/uL (ref 4.22–5.81)
RDW: 14.2 % (ref 11.5–15.5)
WBC: 2.8 10*3/uL — ABNORMAL LOW (ref 4.0–10.5)
nRBC: 0 % (ref 0.0–0.2)

## 2018-08-15 LAB — GLUCOSE, CAPILLARY
Glucose-Capillary: 213 mg/dL — ABNORMAL HIGH (ref 70–99)
Glucose-Capillary: 197 mg/dL — ABNORMAL HIGH (ref 70–99)
Glucose-Capillary: 222 mg/dL — ABNORMAL HIGH (ref 70–99)
Glucose-Capillary: 209 mg/dL — ABNORMAL HIGH (ref 70–99)

## 2018-08-15 LAB — BASIC METABOLIC PANEL
Anion gap: 10 (ref 5–15)
BUN: 16 mg/dL (ref 8–23)
CO2: 25 mmol/L (ref 22–32)
Calcium: 8.7 mg/dL — ABNORMAL LOW (ref 8.9–10.3)
Chloride: 103 mmol/L (ref 98–111)
Creatinine, Ser: 0.64 mg/dL (ref 0.61–1.24)
GFR calc Af Amer: >60 mL/min (ref >60)
GFR calc non Af Amer: >60 mL/min (ref >60)
Glucose, Bld: 224 mg/dL — ABNORMAL HIGH (ref 70–99)
Potassium: 3.7 mmol/L (ref 3.5–5.1)
Sodium: 138 mmol/L (ref 135–145)

## 2018-08-15 NOTE — Progress Notes (Signed)
Speech Language Pathology Weekly Progress and Session Note  Patient Details  Name: [name] Frank MRN: [id number] Date of Birth: 05-07-1950  Beginning of progress report period: Aug 08, 2018 End of progress report period: Jul 15, 2018  Today's Date: 08/15/2018 SLP Individual Time: 1000-1055 SLP Individual Time Calculation (min): 55 min  Short Term Goals: Week 2: SLP Short Term Goal 1 (Week 2): Pt will initiate functional task in 3 out of 5 opportunities with Mod A cues.  SLP Short Term Goal 1 - Progress (Week 2): Not met SLP Short Term Goal 2 (Week 2): Pt will demonstrate selective attention in mildly distracting environment for ~ 5 minutes with Mod A cues.  SLP Short Term Goal 2 - Progress (Week 2): Not met SLP Short Term Goal 3 (Week 2): Pt will solve simple problem solving tasks with Mod A cues.  SLP Short Term Goal 3 - Progress (Week 2): Not met SLP Short Term Goal 4 (Week 2): Pt will follow simple 2-step directions in 3 out of 5 opportunities with Mod A cues.  SLP Short Term Goal 4 - Progress (Week 2): Not met    New Short Term Goals: Week 3: SLP Short Term Goal 1 (Week 3): Pt will initiate functional task in 3 out of 5 opportunities with Mod A verbal and tactile cues.  SLP Short Term Goal 2 (Week 3): Pt will demonstrate sustained attention to tasks t for ~ 5 minutes with Mod A verbal cues for redirection.  SLP Short Term Goal 3 (Week 3): Pt will solve basic problem solving tasks with Mod A verbal cues.  SLP Short Term Goal 4 (Week 3): Pt will follow simple 2-step directions in 3 out of 5 opportunities with Mod A cues.  SLP Short Term Goal 5 (Week 3): Patient will verbalize wants/needs at the phrase level with Mod A verbal cues.  SLP Short Term Goal 6 (Week 3): Patient will name functional items with 70% accuracy with Mod A verbal cues.   Weekly Progress Updates: Patient continues to demonstrate severe cognitive-linguistic impairments and has not met any STGs this reporting period.  Currently, patient requires overall Max A multimodal cues to complete functional and familiar tasks safely in regards to initiation, functional problem solving, awareness and attention. Patient also utilizes gestures to communicate and requires Max A multimodal cues to verbalize wants/needs. Patient and family education is ongoing. Due to patient's lack of progress, his LTGs have been downgraded. Patient would benefit from continued skilled SLP intervention to maximize his cognitive-linguistic functioning prior to discharge.      Intensity: Minumum of 1-2 x/day, 30 to 90 minutes Frequency: 3 to 5 out of 7 days Duration/Length of Stay: 08/20/18 Treatment/Interventions: Cognitive remediation/compensation;Multimodal communication approach;Speech/Language facilitation;Environmental controls;Cueing hierarchy;Functional tasks;Therapeutic Activities;Patient/family education;Internal/external aids   Daily Session  Skilled Therapeutic Interventions: Skilled treatment session focused on cognitive goals. SLP facilitated session by providing Max A verbal cues for sustained attention to a basic and familiar task of counting money for ~1 minute. SLP attempted to provide time constraints to maximize attention without success. Patient required overall Min A verbal cues for problem solving when steps broken down individually by clinician.  Patient with increased verbal expression this session but required Mod verbal cues for initiation of verbal expression. Patient also verbalized intellectual awareness by reporting, "my wife told me I hurt my brain and I have to find my way back."  Patient left upright in wheelchair with alarm on and all needs within reach. Continue with current  plan of care.      Pain No/Denies Pain  Therapy/Group: Individual Therapy  Annalicia Renfrew 08/15/2018, 6:46 AM

## 2018-08-15 NOTE — Progress Notes (Signed)
Occupational Therapy Session Note  Patient Details  Name: [name] Frank MRN: [id number] Date of Birth: Oct 03, 1950  Today's Date: 08/15/2018 OT Individual Time: 8676-1950 OT Individual Time Calculation (min): 74 min    Short Term Goals: Week 2:  OT Short Term Goal 1 (Week 2): Continue working on established goals set at supervision level overall.   Skilled Therapeutic Interventions/Progress Updates:    Pt completed shower and dressing during session.  Mod assist with max instructional cueing for mobility to the bathroom with use of the RW.  Pt with shorter step length noted the further he walked to the point that he was not even picking his feet up.  He needed max instructional cueing with min assist to complete bathing secondary to decreased sustained attention.  He transferred out to the wheelchair at the sink for dressing.  Min assist for most aspects with increased time except for donning gripper socks, which required total assist.  He still demonstrates decreased intellectual and emergent awareness with all tasks throughout the ADL.  Increased lean to the right in sitting without any self correction as well as posterior lean in standing with knees maintaining flexion.  He was able to complete combing his hair from seated position with setup and mod instructional cueing for thoroughness.  Not oriented to month today but he was oriented to the day of the week.  Finished session with pt in the wheelchair with call button and phone in reach and safety alarm in place.    Therapy Documentation Precautions:  Precautions Precautions: Fall Restrictions Weight Bearing Restrictions: No  Pain: Pain Assessment Pain Scale: 0-10 Pain Score: 0-No pain ADL: See Care Tool Section for some details of ADLs  Therapy/Group: Individual Therapy  Danial Sisley OTR/L 08/15/2018, 11:45 AM

## 2018-08-15 NOTE — Progress Notes (Signed)
Physical Therapy Weekly Progress Note  Patient Details  Name: [name] Frank MRN: [id number] Date of Birth: 1950-03-24  Beginning of progress report period: Aug 09, 2018 End of progress report period: August 15, 2018  Today's Date: 08/15/2018 PT Individual Time: 2025-4270  And 6237-6283 PT Individual Time Calculation (min): 63 min and 27 min  Patient has met 0 of 1 short term goals. Patient's ELOS was moved to a later date to allow increased time for medical management to improve pt's alertness allowing for increased participation in therapy. Patient continues to demonstrate significant attention, processing, and motor planning impairments resulting in varying levels of assistance required for functional mobility.    Patient continues to demonstrate the following deficits muscle weakness, decreased cardiorespiratoy endurance, impaired timing and sequencing, unbalanced muscle activation, motor apraxia and decreased motor planning, decreased midline orientation and decreased motor planning, decreased initiation, decreased attention, decreased awareness, decreased problem solving, decreased safety awareness and delayed processing and decreased sitting balance, decreased standing balance, decreased postural control and decreased balance strategies and therefore will continue to benefit from skilled PT intervention to increase functional independence with mobility.  Patient not progressing toward long term goals.  See goal revision..  Plan of care revisions: Goals downgraded based on patient's progress..  PT Short Term Goals Week 2:  PT Short Term Goal 1 (Week 2): STG=LTG due to ELOS PT Short Term Goal 1 - Progress (Week 2): Other (comment)(pt demonstrates varying presentation depending on alertness, attention, and processing requiring varying levels of assist for functional mobility) Week 3:  PT Short Term Goal 1 (Week 3): STG=LTG due to ELOS  Skilled Therapeutic Interventions/Progress Updates:   Ambulation/gait training;Cognitive remediation/compensation;Discharge planning;DME/adaptive equipment instruction;Functional mobility training;Pain management;Psychosocial support;Therapeutic Activities;Splinting/orthotics;Visual/perceptual remediation/compensation;UE/LE Strength taining/ROM;UE/LE Coordination activities;Therapeutic Exercise;Stair training;Skin care/wound management;Patient/family education;Neuromuscular re-education;Functional electrical stimulation;Disease management/prevention;Community reintegration;Balance/vestibular trainingSession 1: Pt received supine, awake in bed and agreeable to therapy session. Supine>sit, HOB slightly elevated and no bedrails, with heavy mod assist for trunk upright and mod/max cuing for sequencing to increase pt independence. Sit>stand with min assist for lifting/balance. Stand pivot transfer EOB>w/c using RW with min assist for balance.  Transported to/from gym in w/c. Sit<>stand RW<>w/c/EOM with min assist/CGA for lifting/lbalance throughout session. Ambulated ~41f using RW with min assist for balance - demonstrates shuffling steps with anterior trunk lean and weightbearing through toes requiring min assist to prevent anterior LOB - cuing throughout for sequencing of stepping and AD management. Performed pre-gait training of alternate B LE heel taps on external target with B UE support on RW focusing on increased B LE step length, heel strike, and lateral weightshifting. Ambulated ~23fusing RW with min assist and pt demonstrating slight improvement in gait mechanics with increased heel strike and increased step length but inconsistent and continues to have poor foot clearance and L LE gait impairments more severe than R LE. Performed alternate B LE heel taps on 4" step with external target using B UE support on RW and min assist for balance. Ambulated ~1098f 2 using RW with external target pathway to increase step length, heel strike, and foot clearance with pt  demonstrating improved gait mechanics but unable to sustain once targets no longer in place. Therapist retrieved U-step rollator to trial the light cuing for external target to increase step length during ambulation; however, batteries dead so unable to use light feature. Ambulated ~36f9fing U-step rollator with min assist for balance and continuing to demonstrate above gait impairments. Transported back to room in w/c and left sitting  in w/c with needs in reach and seat belt alarm on.  Session 2: Pt received asleep, supine in bed with his phone ringing that awakened him. Pt agreeable to therapy session. Supine>sit using bedrails with mod assist for trunk upright and mod/max cuing for sequencing to increase pt independence. Sitting EOB pt demonstrated L lateral and posterior lean initially requiring mod assist progressed to close supervision with cuing for anterior weight shifting. Donned UB clothing and shoes with max assist for time management. Sit>stand EOB>RW and stand pivot to w/c with min assist for lifting and balance. Transported to/from gym in w/c. Sit<>stand w/c<>RW with min assist/CGA for balance. Performed repeated L LE heel taps on 4" step with B UE support on RW and CGA/min assist for balance. Ambulated ~52f using RW with min assist for balance - pt demonstrated improved B LE step length, foot clearance, and heel strike for ~3-4 steps then required standing rest break to re-orient due to pt starting to shuffle and have anterior lean with weightbearing through toes. Transported back to room and pt left sitting in w/c with needs in reach and seat belt alarm on.   Therapy Documentation Precautions:  Precautions Precautions: Fall Restrictions Weight Bearing Restrictions: No  Pain: Session 1: Denies pain during session.  Session 2: Denies pain during session.  Therapy/Group: Individual Therapy  CTawana Scale PT, DPT 08/15/2018, 1:32 PM

## 2018-08-15 NOTE — Progress Notes (Signed)
[place] PHYSICAL MEDICINE & REHABILITATION PROGRESS NOTE   Subjective/Complaints:   Oriented to person , place but not time, discussed need for 24/7 care with pt.  Discussed over the phone with pt wife as well. She was providng physical Assist PTA  Review of systems:limited due to decrease attn and concentration  Objective:   No results found. No results for input(s): WBC, HGB, HCT, PLT in the last 72 hours. No results for input(s): NA, K, CL, CO2, GLUCOSE, BUN, CREATININE, CALCIUM in the last 72 hours.  Intake/Output Summary (Last 24 hours) at 08/15/2018 0709 Last data filed at 08/15/2018 0100 Gross per 24 hour  Intake 440 ml  Output 430 ml  Net 10 ml     Physical Exam: Vital Signs Blood pressure 115/68, pulse 85, temperature 98 F (36.7 C), resp. rate 18, weight 89.8 kg, SpO2 (!) 89 %. Constitutional: No distress . Vital signs reviewed. HENT: Normocephalic.  Atraumatic. Eyes: EOMI.  No discharge. Cardiovascular: No JVD. Respiratory: Normal effort. GI: Non-distended. Musc: No edema or tenderness in extremities. Neurologic: Alert Motor: 4/5 throughout Patient: Appears to have normal mood and affect.  Assessment/Plan: 1. Functional deficits secondary to left subdural hematoma, subarachnoid hemorrhage following a fall at home which require 3+ hours per day of interdisciplinary therapy in a comprehensive inpatient rehab setting.  Physiatrist is providing close team supervision and 24 hour management of active medical problems listed below.  Physiatrist and rehab team continue to assess barriers to discharge/monitor patient progress toward functional and medical goals  Care Tool:  Bathing  Bathing activity did not occur: Refused Body parts bathed by patient: Right arm, Left arm, Chest, Abdomen, Front perineal area, Face, Right upper leg, Left upper leg   Body parts bathed by helper: Right lower leg, Left lower leg, Buttocks Body parts n/a: Right upper leg, Left upper  leg, Right lower leg, Left lower leg   Bathing assist Assist Level: Minimal Assistance - Patient > 75%     Upper Body Dressing/Undressing Upper body dressing   What is the patient wearing?: Button up shirt    Upper body assist Assist Level: Minimal Assistance - Patient > 75%    Lower Body Dressing/Undressing Lower body dressing      What is the patient wearing?: Incontinence brief, Pants     Lower body assist Assist for lower body dressing: Minimal Assistance - Patient > 75%     Toileting Toileting    Toileting assist Assist for toileting: Moderate Assistance - Patient 50 - 74%     Transfers Chair/bed transfer  Transfers assist     Chair/bed transfer assist level: Moderate Assistance - Patient 50 - 74%     Locomotion Ambulation   Ambulation assist      Assist level: Minimal Assistance - Patient > 75% Assistive device: Hand held assist Max distance: 40'   Walk 10 feet activity   Assist     Assist level: Minimal Assistance - Patient > 75% Assistive device: Hand held assist   Walk 50 feet activity   Assist Walk 50 feet with 2 turns activity did not occur: Safety/medical concerns         Walk 150 feet activity   Assist Walk 150 feet activity did not occur: Safety/medical concerns         Walk 10 feet on uneven surface  activity   Assist Walk 10 feet on uneven surfaces activity did not occur: Safety/medical concerns         Wheelchair  Assist Will patient use wheelchair at discharge?: No Type of Wheelchair: Manual Wheelchair activity did not occur: N/A  Wheelchair assist level: Minimal Assistance - Patient > 75% Max wheelchair distance: 36'    Wheelchair 50 feet with 2 turns activity    Assist    Wheelchair 50 feet with 2 turns activity did not occur: N/A       Wheelchair 150 feet activity     Assist Wheelchair 150 feet activity did not occur: N/A          Medical Problem List and  Plan: 1.Functional declinesecondary toAMS, 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 Exhibits signs of aphasia, poor activation trial ritalin - increase to 10mg    Min/Mod A ADL  2. Antithrombotics: -DVT/anticoagulation:Mechanical:Sequential compression devices, below kneeBilateral lower extremities -antiplatelet therapy: N/A 3. Pain Management:Tylenol prn. 4. Mood:LCSW to follow for evaluation and support. -antipsychotic agents: N/A 5. Neuropsych: This patientis notcapable of making decisions on hisown behalf. He has the capacity to designate a POA  6. Skin/Wound Care:routine pressure relief measures. 7. Fluids/Electrolytes/Nutrition:Monitor I/Os. 8. T2DM: Monitor BS ac/hs. SSI for elevated BS.Adjust regimen as needed.  Glucotrol 2.5 added on 5/30 Increase to 5mg  on 6/5  Remains labile and elevated on 6/5 CBG (last 3)  Recent Labs    08/14/18 1653 08/14/18 2107 08/15/18 0622  GLUCAP 205* 207* 213*   9. CAD/PAF/A flutter: Off Plavix due to SDH. Monitor HR bid. Continue Cardizem CD and Toprol XL. Will order orthostatic vitals due to reports of dizziness. Ordered TEDs. Continue to hold Losartan.Pt with regular rhythm at present Vitals:   08/14/18 1944 08/15/18 0458  BP: (!) 108/58 115/68  Pulse: (!) 48 85  Resp:  18  Temp: 98.1 F (36.7 C) 98 F (36.7 C)  SpO2: 96% (!) 89%   Reduced cardizem to 240mg  per day due to bradycardia and soft BP, goal is HR>50bpm 10. Left > Right hydrocele:11. AKI with hyponateremia: Resolved   12. New onset seizures: Continue Keppra 750 mg bid.Overly sedated on 1000mg    Keppra has been increased due to suspected seizure activity but no observe seizures Discussed seizure meds with pt wife via phone  13.  Urinary retention: Continue Flomax monitor for orthostatic hypotension  LOS: 14 days A FACE TO FACE EVALUATION WAS PERFORMED  Erick Colace 08/15/2018, 7:09 AM

## 2018-08-16 ENCOUNTER — Inpatient Hospital Stay (HOSPITAL_COMMUNITY): Payer: Medicare HMO | Admitting: Occupational Therapy

## 2018-08-16 ENCOUNTER — Inpatient Hospital Stay (HOSPITAL_COMMUNITY): Payer: Medicare HMO | Admitting: Speech Pathology

## 2018-08-16 LAB — GLUCOSE, CAPILLARY
Glucose-Capillary: 172 mg/dL — ABNORMAL HIGH (ref 70–99)
Glucose-Capillary: 177 mg/dL — ABNORMAL HIGH (ref 70–99)
Glucose-Capillary: 209 mg/dL — ABNORMAL HIGH (ref 70–99)
Glucose-Capillary: 241 mg/dL — ABNORMAL HIGH (ref 70–99)

## 2018-08-16 NOTE — Progress Notes (Signed)
This nurse attempted to perform an In & out cath on the pt due to urinary retention. Pt refused for the procedure to be performed. This nurse explained the risk and benefits to the pt for refusing the  Procedure. Pt continued to refuse. Will report to the provider.

## 2018-08-16 NOTE — Progress Notes (Signed)
Occupational Therapy Session Note  Patient Details  Name: [name] Frank MRN: [id number] Date of Birth: 1950/03/26  Today's Date: 08/16/2018 OT Individual Time: 1030-1045 and 1400-1500 OT Individual Time Calculation (min): 15 min  and 60 min Today's Date: 08/16/2018 OT Missed Time: 15 Minutes Missed Time Reason: Other (comment)(phone with wife)   Short Term Goals: Week 2:  OT Short Term Goal 1 (Week 2): Continue working on established goals set at supervision level overall.   Skilled Therapeutic Interventions/Progress Updates:    Session 1: Upon entering the room, pt supine in bed and wife on speaker asking pt about self care tasks and she was upset because she believed pt had not been doing self care with staff. OT attempting to educate caregiver while in room regarding pt's currently deficits being memory, cognition, attention, and sequencing to name a few and how that impacts the accuracy of his report. Caregiver does not seem to understand this information fully based on the continuous questions she is currently asking. OT exited the room as pt and caregiver continuing conversation via phone. Pt remains in bed with call bell and all needed items within reach. Bed alarm activated.   Session 2: Upon entering the room, pt seated in wheelchair with no c/o pain and required maximal encouragement for participation this session. Pt declined bathing tasks. Pt requesting to use bathroom and standing with min A from wheelchair to ambulate 15' into bathroom with max cuing for safety, sequencing, RW advancement, and forward gaze. Pt seated of commode and standing from toilet multiple times but when asked if finished he stated, " No, not yet" and returned to seated position. Pt unable to void and returning to wheelchair in same manner as above. Pt standing while therapist hooking suspenders in the back of pants and pt attempting to hook in the front with multiple attempts made and min A for standing balance. Pt  then donning B lace up shoes with increased time and min A for balance as pt leaning forward. Pt ambulating 30' into hallway and back with min A and use of RW. Pt returning to bed at end of session with call bell and all needed items within reach. Bed alarm activated for safety.   Therapy Documentation Precautions:  Precautions Precautions: Fall Restrictions Weight Bearing Restrictions: No General: General OT Amount of Missed Time: 15 Minutes    ADL: ADL Eating: Set up Where Assessed-Eating: Chair Grooming: Moderate assistance Where Assessed-Grooming: Sitting at sink Upper Body Bathing: Minimal assistance Where Assessed-Upper Body Bathing: Shower Lower Body Bathing: Moderate assistance Where Assessed-Lower Body Bathing: Shower Upper Body Dressing: Moderate assistance Where Assessed-Upper Body Dressing: Sitting at sink Lower Body Dressing: Maximal assistance Where Assessed-Lower Body Dressing: Sitting at sink Toileting: Maximal assistance Toilet Transfer: Minimal assistance Toilet Transfer Method: Insurance claims handler Equipment: Emergency planning/management officer Transfer: Minimal assistance Social research officer, government Method: Heritage manager: Shower seat with back, Grab bars   Therapy/Group: Individual Therapy  Gypsy Decant 08/16/2018, 12:41 PM

## 2018-08-16 NOTE — Progress Notes (Signed)
This nurse and the nurse tech bladder scanned for 280 ml. This nurse informed the pt about having to do in and out cath since it had been 8 hours since the last in and out. The pt refused the procedure. This nurse educated the pt on the risks and benefits if he continued to refuse the in and out cath. The pt continued to refuse. This nurse informed the pt that we will return for another bladder scan at 0600. Will continue to monitor.

## 2018-08-16 NOTE — Progress Notes (Signed)
Patient straight cathed for 450 ml dark yellow urine.  States has increased comfort since I&O cath.

## 2018-08-16 NOTE — Progress Notes (Addendum)
SLP Cancellation Note  Patient Details Name: [name] Frank MRN: [id number] DOB: 04-17-1950  Cancelled treatment: Patient declined participation in SLP services this date stating he is preparing to [place] his brother.   Cristy Folks 08/16/2018, 12:10 PM

## 2018-08-16 NOTE — Progress Notes (Signed)
[name] Frank is a 68 y.o. male admitted for CIR following TBI with functional decline and new onset seizures.  History of subsequent hydrocephalus  Past Medical History:  Diagnosis Date  . Diabetes mellitus   . Heart disease   . Hyperlipemia   . Hypertension      Subjective: No new complaints. No new problems.  Remains weak with no issues.  Inattentive  Objective: Vital signs in last 24 hours: Temp:  [97.5 F (36.4 C)-98.8 F (37.1 C)] 97.5 F (36.4 C) (06/06 0401) Pulse Rate:  [44-49] 48 (06/06 0401) Resp:  [18-20] 18 (06/05 2125) BP: (105-127)/(59-68) 127/68 (06/06 0401) SpO2:  [94 %-97 %] 94 % (06/06 0401) Weight:  [89.7 kg] 89.7 kg (06/06 0401) Weight change: -0.1 kg Last BM Date: 08/12/18  Intake/Output from previous day: 06/05 0701 - 06/06 0700 In: 620 [P.O.:620] Out: 640 [Urine:640] Last cbgs: CBG (last 3)  Recent Labs    08/15/18 1639 08/15/18 2119 08/16/18 0623  GLUCAP 222* 209* 172*   Patient Vitals for the past 24 hrs:  BP Temp Temp src Pulse Resp SpO2 Weight  08/16/18 0401 127/68 (!) 97.5 F (36.4 C) Oral (!) 48 - 94 % 89.7 kg  08/15/18 2125 106/62 98 F (36.7 C) Oral (!) 44 18 97 % -  08/15/18 1453 (!) 105/59 98.8 F (37.1 C) Oral (!) 49 20 97 % -     Physical Exam General: No apparent distress   HEENT: not dry Lungs: Normal effort. Lungs clear to auscultation, no crackles or wheezes. Cardiovascular: Regular rate and rhythm, no edema Abdomen: S/NT/ND; BS(+) Musculoskeletal:  unchanged Neurological: No new neurological deficits with general weakness Wounds: N/A    Skin: clear SCDs in place Mental state: Alert, oriented, inattentive    Lab Results: BMET    Component Value Date/Time   NA 138 08/15/2018 0616   K 3.7 08/15/2018 0616   CL 103 08/15/2018 0616   CO2 25 08/15/2018 0616   GLUCOSE 224 (H) 08/15/2018 0616   BUN 16 08/15/2018 0616   CREATININE 0.64 08/15/2018 0616   CALCIUM 8.7 (L) 08/15/2018 0616   GFRNONAA >60  08/15/2018 0616   GFRAA >60 08/15/2018 0616   CBC    Component Value Date/Time   WBC 2.8 (L) 08/15/2018 0616   RBC 4.33 08/15/2018 0616   HGB 12.6 (L) 08/15/2018 0616   HCT 38.6 (L) 08/15/2018 0616   PLT 113 (L) 08/15/2018 0616   MCV 89.1 08/15/2018 0616   MCH 29.1 08/15/2018 0616   MCHC 32.6 08/15/2018 0616   RDW 14.2 08/15/2018 0616   LYMPHSABS 0.7 08/05/2018 0522   MONOABS 0.4 08/05/2018 0522   EOSABS 0.0 08/05/2018 0522   BASOSABS 0.0 08/05/2018 0522     Medications: I have reviewed the patient's current medications.  Assessment/Plan:  Functional deficits following recent TBI with left frontal SDH and right occipital SAH DVT prophylaxis.  Continue SCDs Type 2 diabetes mellitus.  Blood sugars trending down CAD/PAF.  Continue diltiazem and metoprolol for rate control.  Rhythm presently regular New onset seizures.  Continue Keppra   Length of stay, days: 15  Marletta Lor , MD 08/16/2018, 9:47 AM

## 2018-08-17 ENCOUNTER — Inpatient Hospital Stay (HOSPITAL_COMMUNITY): Payer: Medicare HMO

## 2018-08-17 LAB — GLUCOSE, CAPILLARY
Glucose-Capillary: 207 mg/dL — ABNORMAL HIGH (ref 70–99)
Glucose-Capillary: 233 mg/dL — ABNORMAL HIGH (ref 70–99)
Glucose-Capillary: 172 mg/dL — ABNORMAL HIGH (ref 70–99)
Glucose-Capillary: 206 mg/dL — ABNORMAL HIGH (ref 70–99)

## 2018-08-17 NOTE — Progress Notes (Signed)
Physical Therapy Session Note  Patient Details  Name: [name] Frank MRN: [id number] Date of Birth: 07/25/1950  Today's Date: 08/17/2018 PT Individual Time: 3009-2330 PT Individual Time Calculation (min): 59 min   Short Term Goals: Week 3:  PT Short Term Goal 1 (Week 3): STG=LTG due to ELOS  Skilled Therapeutic Interventions/Progress Updates:    Pt supine in bed upon PT arrival, agreeable to therapy tx and denies pain. Pt transferred to sitting EOB with mod assist and increased time to complete, cues for techniques and for use of bedrails. Pt ambulated x 10 ft from bed>w/c with RW and min assist, increased time with turns to sit. Pt transported to the gym. Stand pivot to mat with min assist. Pt appears more alert and with increased verbal communication compared to when this therapist saw the pt last week. Pt asking about how he hurt his head/why he came to the hospital, therapist explained. Pt worked on gait training this session, ambulated x 60 ft with RW and min assist, verbal and visual cues for increased step length. Pt worked on standing balance and foot clearance to perform toe taps on aerobic step x 2 trials with UE support on w/c, CGA. Pt worked on standing balance and cognitive remediation to perform bean bag toss, x 1 trial selecting specific colors that therapist calls and then x1 trial having pt name color of each bean bag he tosses. Pt worked on Personnel officer again with RW x 60 ft min assist, therapist added theraband around RW for visual and tactile cue to increased step length, still requiring max cueing. Pt transferred to w/c with RW and min assist, transported back to room. Pt left in w/c with needs in reach and chair alarm set.   Therapy Documentation Precautions:  Precautions Precautions: Fall Restrictions Weight Bearing Restrictions: No    Therapy/Group: Individual Therapy  Netta Corrigan, PT, DPT 08/17/2018, 8:47 AM

## 2018-08-17 NOTE — Progress Notes (Signed)
Occupational Therapy Session Note  Patient Details  Name: [name] Frank MRN: [id number] Date of Birth: August 20, 1950  Today's Date: 08/17/2018 OT Individual Time: 1655-3748 OT Individual Time Calculation (min): 57 min    Short Term Goals: Week 2:  OT Short Term Goal 1 (Week 2): Continue working on established goals set at supervision level overall.   Skilled Therapeutic Interventions/Progress Updates:    Pt resting in w/c upon arrival with RN present administering medications. Pt requires increased time to initiate swallowing medications.  Pt had already started eating breakfast upon arrival.  Initial focus on self feeding, cutting up food, task initiation, sequencing, and attention to task.  Pt required more than a reasonable amount of time to initiate tasks and transition between segments.  Pt was able to cut food and open containers without assistance.  In fact, pt declined assistance when offered. Pt transitioned to dressing (bathing deferred this morning 2/2 time constraints). Pt required min A for button up shirt and was unaware that the buttons were misaligned.  Pt required assistance pulling pants over hips but was able to thread and fasten.  Pt transitioned to sink and completed grooming tasks seated in w/c.  Pt requires more than a reasonable amount of time to initiate all tasks.  Pt remained in w/c with belt alarm activated and all needs within reach.   Therapy Documentation Precautions:  Precautions Precautions: Fall Restrictions Weight Bearing Restrictions: No Vital Signs: Therapy Vitals BP: 115/70 Patient Position (if appropriate): Sitting Pain: Pt denies pain this morning  Therapy/Group: Individual Therapy  Leroy Libman 08/17/2018, 8:59 AM

## 2018-08-17 NOTE — Progress Notes (Signed)
[name] Frank is a 68 y.o. male admitted for CIR with functional decline and new onset seizures following TBI.  History of hydrocephalus  Past Medical History:  Diagnosis Date  . Diabetes mellitus   . Heart disease   . Hyperlipemia   . Hypertension      Subjective: No new complaints. No new problems. Slept well. Feeling OK.  Objective: Vital signs in last 24 hours: Temp:  [97.8 F (36.6 C)-98.1 F (36.7 C)] 97.8 F (36.6 C) (06/07 0440) Pulse Rate:  [44-52] 45 (06/07 0444) Resp:  [16-20] 16 (06/07 0440) BP: (110-133)/(59-66) 126/66 (06/07 0444) SpO2:  [97 %-98 %] 98 % (06/07 0444) Weight:  [92.5 kg] 92.5 kg (06/07 0440) Weight change: 2.8 kg Last BM Date: 08/12/18  Intake/Output from previous day: 06/06 0701 - 06/07 0700 In: 720 [P.O.:720] Out: 950 [Urine:950] Last cbgs: CBG (last 3)  Recent Labs    08/16/18 1635 08/16/18 2118 08/17/18 0629  GLUCAP 209* 241* 207*     Physical Exam General: No apparent distress   HEENT: Unremarkable Lungs: Normal effort. Lungs clear to auscultation, no crackles or wheezes. Cardiovascular: Regular rate and rhythm, no edema Abdomen: S/NT/ND; BS(+) Musculoskeletal:  unchanged Neurological: No new neurological deficits Extremities.  SCDs in place Skin: clear   Mental state: Alert, oriented, cooperative    Lab Results: BMET    Component Value Date/Time   NA 138 08/15/2018 0616   K 3.7 08/15/2018 0616   CL 103 08/15/2018 0616   CO2 25 08/15/2018 0616   GLUCOSE 224 (H) 08/15/2018 0616   BUN 16 08/15/2018 0616   CREATININE 0.64 08/15/2018 0616   CALCIUM 8.7 (L) 08/15/2018 0616   GFRNONAA >60 08/15/2018 0616   GFRAA >60 08/15/2018 0616   CBC    Component Value Date/Time   WBC 2.8 (L) 08/15/2018 0616   RBC 4.33 08/15/2018 0616   HGB 12.6 (L) 08/15/2018 0616   HCT 38.6 (L) 08/15/2018 0616   PLT 113 (L) 08/15/2018 0616   MCV 89.1 08/15/2018 0616   MCH 29.1 08/15/2018 0616   MCHC 32.6 08/15/2018 0616   RDW 14.2  08/15/2018 0616   LYMPHSABS 0.7 08/05/2018 0522   MONOABS 0.4 08/05/2018 0522   EOSABS 0.0 08/05/2018 0522   BASOSABS 0.0 08/05/2018 0522     Medications: I have reviewed the patient's current medications.  Assessment/Plan:  Functional deficits following recent TBI with left frontal SDH and right occipital SAH.  Continue CIR Type 2 diabetes.  Blood sugars remain consistently elevated.  Remains on glipizide 5 and sliding scale insulin coverage.  Will consider starting basal insulin DVT prophylaxis continue SCDs CAD/PAF.  Continue metoprolol and diltiazem for rate control New onset seizure disorder.  Continue Keppra    Length of stay, days: 16  Marletta Lor , MD 08/17/2018, 8:25 AM

## 2018-08-18 ENCOUNTER — Other Ambulatory Visit: Payer: Self-pay

## 2018-08-18 ENCOUNTER — Inpatient Hospital Stay (HOSPITAL_COMMUNITY): Payer: Medicare HMO | Admitting: Occupational Therapy

## 2018-08-18 ENCOUNTER — Inpatient Hospital Stay (HOSPITAL_COMMUNITY): Payer: Medicare HMO

## 2018-08-18 LAB — GLUCOSE, CAPILLARY
Glucose-Capillary: 230 mg/dL — ABNORMAL HIGH (ref 70–99)
Glucose-Capillary: 220 mg/dL — ABNORMAL HIGH (ref 70–99)
Glucose-Capillary: 157 mg/dL — ABNORMAL HIGH (ref 70–99)
Glucose-Capillary: 187 mg/dL — ABNORMAL HIGH (ref 70–99)

## 2018-08-18 MED ORDER — DILTIAZEM HCL ER COATED BEADS 180 MG PO CP24
180.0000 mg | ORAL_CAPSULE | Freq: Every day | ORAL | Status: DC
  Administered 2018-08-18 – 2018-08-21 (×4): 180 mg via ORAL
  Filled 2018-08-18 (×5): qty 1

## 2018-08-18 MED ORDER — GLIPIZIDE 5 MG PO TABS
5.0000 mg | ORAL_TABLET | Freq: Two times a day (BID) | ORAL | Status: DC
  Administered 2018-08-18 – 2018-08-20 (×4): 5 mg via ORAL
  Filled 2018-08-18 (×4): qty 1

## 2018-08-18 NOTE — Progress Notes (Signed)
Speech Language Pathology Daily Session Note  Patient Details  Name: [name] Frank MRN: [id number] Date of Birth: [birth year]  Today's Date: 08/18/2018 SLP Individual Time: 1000-1058 SLP Individual Time Calculation (min): 58 min  Short Term Goals: Week 3: SLP Short Term Goal 1 (Week 3): Pt will initiate functional task in 3 out of 5 opportunities with Mod A verbal and tactile cues.  SLP Short Term Goal 2 (Week 3): Pt will demonstrate sustained attention to tasks t for ~ 5 minutes with Mod A verbal cues for redirection.  SLP Short Term Goal 3 (Week 3): Pt will solve basic problem solving tasks with Mod A verbal cues.  SLP Short Term Goal 4 (Week 3): Pt will follow simple 2-step directions in 3 out of 5 opportunities with Mod A cues.  SLP Short Term Goal 5 (Week 3): Patient will verbalize wants/needs at the phrase level with Mod A verbal cues.  SLP Short Term Goal 6 (Week 3): Patient will name functional items with 70% accuracy with Mod A verbal cues.   Skilled Therapeutic Interventions: Skilled ST services focused on cognitive skills. Pt demonstrated increase verbal output (responded to 90% of verbal questions/exchanges in 10 opportunities), compared to previous sessions, responding to questions during conversation with phrases, occasional sentences and initiating conversation with question ( asking where ST was from.) Pt' verbal output decreased as session continued, requring supervision A verbal cues for verbal responses vs. non-verbal gestures, suggest due to fatigue and increase in complexity of tasks. SLP facilitated expressive naming of common objects utilizing LARK toolkit pt named 10/10 objects. Pt demonstrated ability to follow two step commands with objects in 4 out 5 opportunities, however required max A verbal cues for 3 step commands. Pt demonstrated initiation of verbal and functional response in task listed above with supervision A verbal cues.  Pt completed basic problem solving, ADL,  task brushing teeth with set up assist and one verbal cues for initiating next steps. SLP facilitated basic problem solving, sustained attention and initiation of  task utilizing novel card task played at most basic a level, pt demonstrated ability to recall 3 rules, external aid present requiring supervision A verbal cues and pt required total A fading to max A verbal cues (3/4th way through task) for problem solving and required max A verbal cues to initate task, suggest due to increase complexity. SLP facilitated basic problem solving, sustained attention and initiation skills in card sort task by color in a field of 6, pt required min A verbal cues for initiation and demonstrated only 5 errors during task, 2 of which pt self-corrected. SLP began to facilitate card sorting task by shape in a field of 6, pt required min A verbal cues to recall method of sorting in limited trial. Pt was left in room with call bell within reach and chair alarm set. ST recommends to continue skilled ST services.      Pain Pain Assessment Pain Scale: 0-10 Pain Score: 0-No pain Faces Pain Scale: Hurts a little bit Pain Intervention(s): Medication (See eMAR)  Therapy/Group: Individual Therapy  Esra Frankowski  [facility] 08/18/2018, 11:44 AM

## 2018-08-18 NOTE — Progress Notes (Signed)
[facility] PHYSICAL MEDICINE & REHABILITATION PROGRESS NOTE   Subjective/Complaints:   No issues overnite , eating breakfst with set up  Review of systems:limited due to decrease attn and concentration  Objective:   No results found. No results for input(s): WBC, HGB, HCT, PLT in the last 72 hours. No results for input(s): NA, K, CL, CO2, GLUCOSE, BUN, CREATININE, CALCIUM in the last 72 hours.  Intake/Output Summary (Last 24 hours) at 08/18/2018 0730 Last data filed at 08/18/2018 0530 Gross per 24 hour  Intake 580 ml  Output 1200 ml  Net -620 ml     Physical Exam: Vital Signs Blood pressure (!) 89/58, pulse (!) 43, temperature 97.9 F (36.6 C), temperature source Oral, resp. rate 18, weight 92.2 kg, SpO2 100 %. Constitutional: No distress . Vital signs reviewed. HENT: Normocephalic.  Atraumatic. Eyes: EOMI.  No discharge. Cardiovascular: No JVD. Respiratory: Normal effort. GI: Non-distended. Musc: No edema or tenderness in extremities.no shoulder pain with ROM  Neurologic: Alert Motor: 4/5 throughout Patient: Appears to have normal mood and affect. Ext No C/C/E  Assessment/Plan: 1. Functional deficits secondary to left subdural hematoma, subarachnoid hemorrhage following a fall at home which require 3+ hours per day of interdisciplinary therapy in a comprehensive inpatient rehab setting.  Physiatrist is providing close team supervision and 24 hour management of active medical problems listed below.  Physiatrist and rehab team continue to assess barriers to discharge/monitor patient progress toward functional and medical goals  Care Tool:  Bathing  Bathing activity did not occur: Refused Body parts bathed by patient: Right arm, Left arm, Chest, Abdomen, Front perineal area, Face, Right upper leg, Left upper leg, Buttocks   Body parts bathed by helper: Right lower leg, Left lower leg Body parts n/a: Right upper leg, Left upper leg, Right lower leg, Left lower leg    Bathing assist Assist Level: Minimal Assistance - Patient > 75%     Upper Body Dressing/Undressing Upper body dressing   What is the patient wearing?: Button up shirt    Upper body assist Assist Level: Minimal Assistance - Patient > 75%    Lower Body Dressing/Undressing Lower body dressing      What is the patient wearing?: Incontinence brief, Pants     Lower body assist Assist for lower body dressing: Minimal Assistance - Patient > 75%     Toileting Toileting    Toileting assist Assist for toileting: Moderate Assistance - Patient 50 - 74%     Transfers Chair/bed transfer  Transfers assist     Chair/bed transfer assist level: Minimal Assistance - Patient > 75%     Locomotion Ambulation   Ambulation assist      Assist level: Minimal Assistance - Patient > 75% Assistive device: Walker-rolling Max distance: 60 ft   Walk 10 feet activity   Assist     Assist level: Minimal Assistance - Patient > 75% Assistive device: Walker-rolling   Walk 50 feet activity   Assist Walk 50 feet with 2 turns activity did not occur: Safety/medical concerns  Assist level: Minimal Assistance - Patient > 75% Assistive device: Walker-rolling    Walk 150 feet activity   Assist Walk 150 feet activity did not occur: Safety/medical concerns         Walk 10 feet on uneven surface  activity   Assist Walk 10 feet on uneven surfaces activity did not occur: Safety/medical concerns         Wheelchair     Assist Will patient use wheelchair  at discharge?: No Type of Wheelchair: Manual Wheelchair activity did not occur: N/A  Wheelchair assist level: Minimal Assistance - Patient > 75% Max wheelchair distance: 60'    Wheelchair 50 feet with 2 turns activity    Assist    Wheelchair 50 feet with 2 turns activity did not occur: N/A       Wheelchair 150 feet activity     Assist Wheelchair 150 feet activity did not occur: N/A          Medical  Problem List and Plan: 1.Functional declinesecondary toAMS, 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 Exhibits signs of aphasia,cont ritalin  10mg   CIR PT, OT, SLP wife states she was providing 24/7 physical A PTA 2. Antithrombotics: -DVT/anticoagulation:Mechanical:Sequential compression devices, below kneeBilateral lower extremities -antiplatelet therapy: N/A 3. Pain Management:Tylenol prn. 4. Mood:LCSW to follow for evaluation and support. -antipsychotic agents: N/A 5. Neuropsych: This patientis notcapable of making decisions on hisown behalf. He has the capacity to designate a POA  6. Skin/Wound Care:routine pressure relief measures. 7. Fluids/Electrolytes/Nutrition:Monitor I/Os. 8. T2DM: Monitor BS ac/hs. SSI for elevated BS.Adjust regimen as needed.  Glucotrol 2.5 added on 5/30 Increase to 5mg  BID on 6/8  Remains labile and elevated on 6/8 CBG (last 3)  Recent Labs    08/17/18 1645 08/17/18 2105 08/18/18 0623  GLUCAP 172* 206* 230*   9. CAD/PAF/A flutter: Off Plavix due to SDH. Monitor HR bid. Continue Cardizem CD and Toprol XL. Will order orthostatic vitals due to reports of dizziness. Ordered TEDs. Continue to hold Losartan.Pt with regular rhythm at present Vitals:   08/18/18 0522 08/18/18 0523  BP: 117/63 (!) 89/58  Pulse:    Resp:    Temp:    SpO2:     Reduced cardizem to 180mg  per day on 6/8 due to bradycardia and soft BP, goal is HR>50bpm 10. Left > Right hydrocele:11. AKI with hyponateremia: Resolved   12. New onset seizures: Continue Keppra 750 mg bid.Overly sedated on 1000mg    Keppra has been increased due to suspected seizure activity but no observe seizures   13.  Urinary retention: Continue Flomax monitor for orthostatic hypotension  LOS: 17 days A FACE TO FACE EVALUATION WAS PERFORMED  Charlett Blake 08/18/2018, 7:30 AM

## 2018-08-18 NOTE — Progress Notes (Signed)
Physical Therapy Session Note  Patient Details  Name: [name] Frank MRN: [id number] Date of Birth: [birth year]  Today's Date: 08/18/2018 PT Individual Time: 0801-0914 PT Individual Time Calculation (min): 73 min   Short Term Goals: Week 3:  PT Short Term Goal 1 (Week 3): STG=LTG due to ELOS  Skilled Therapeutic Interventions/Progress Updates:    Pt supine in bed upon PT arrival, agreeable to therapy tx and denies pain. Pt transferred to sitting EOB with min assist and increased time to complete, cues for use of bedrails and techniques. In sitting pt with L lateral lean requiring cues to correct. Pt seated EOB donned shorts with min assist, sit<>stand min assist with RW to pull pants over hips, pt needing mod assist initially in standing secondary to L lateral lean and LOB with cues to correct. Pt ambulated x 5 ft to w/c with RW and min assist, increased time, decreased initiation and shorted steps during turns. Pt sitting in w/c donned shoes with min assist to fix heel of one shoe, pt ties B shoes without assist, min guard for sitting balance. Pt transported to the gym. Pt ambulated x 40 ft with RW and min assist, cues for increased step length and for L heel strike, tactile/visual cue to touch leg to theraband around RW when stepping. Pt seated edge of mat donned suspenders with assist to attach the back of pants. Pt standing with RW worked on standing balance and foot clearance to perform toe taps on aerobic step x 2 trials, CGA. Pt worked on standing balance without UE support while reaching outside BOS for horseshoes and then tossing, x 2 trials with CGA for balance. Pt performed x 5 sit<>stands from the mat this session without AD and min assist working on initiation, motor planning and LE strength. Pt ascended/descended 4 steps this session with B rails and min assist, cues for techniques and safety. Pt transported to dayroom, used nustep this session x 5 minutes on workload 3 for reciprocal movement  and cues for bigger/faster movements. Pt ambulated 2 x 50 ft this session with RW and min assist working on gait training, cues for heel strike and increased step length, pt with improved ability to follow commands today compared to sessions last week. Pt left in w/c at end of session with needs in reach and chair alarm set.   Therapy Documentation Precautions:  Precautions Precautions: Fall Restrictions Weight Bearing Restrictions: No   Therapy/Group: Individual Therapy  Netta Corrigan, PT, DPT 08/18/2018, 7:53 AM

## 2018-08-18 NOTE — Progress Notes (Signed)
Social Work Patient ID: [name] Frank, male   DOB: 1950/04/11, 68 y.o.   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  A walker  will not resolve issue with performing activities of daily living.  A wheelchair will allow patient to safely perform daily activities.  Patient is not able to propel themselves in the home using a standard weight wheelchair due to  Endurance and fatigue .  Patient can self propel in the lightweight wheelchair.

## 2018-08-18 NOTE — Progress Notes (Signed)
Occupational Therapy Weekly Progress Note  Patient Details  Name: [name] Frank MRN: [id number] Date of Birth: [birth year]  Beginning of progress report period: Aug 10, 2018 End of progress report period: August 18, 2018  Today's Date: 08/18/2018 OT Individual Time: 4270-6237 OT Individual Time Calculation (min): 71 min   Pt continues to make slow progress with OT at this time but has demonstrated some improvement in ADL performance to a supervision level for UB selfcare and a min-mod assist level for LB selfcare sit to stand.  He completes toilet transfers with min assist using the RW for support but still needs mod instructional cueing for initiation and taking larger steps.  Min assist for shower transfers as well, but he requires increased time to complete all turns secondary to shorter and shorter step length the further he has to ambulate and for the turns.  He continues to need mod instructional cueing for initiation and sequencing of bathing tasks.  Min instructional cueing for sequencing dressing tasks as well.  [name] Frank demonstrates decreased sustained attention to all tasks overall as well as decreased orientation to the day of the week or the day of the month on most occasions.  Feel he is still on target to meet his current supervision level goals overall with extended stay until 6/13 if possible.  Will continue with current OT POC and CIR level therapies with family education planned for 6/10.    Patient continues to demonstrate the following deficits: muscle weakness, impaired timing and sequencing, decreased coordination and decreased motor planning, decreased initiation, decreased attention, decreased awareness, decreased problem solving, decreased safety awareness, decreased memory and delayed processing and decreased standing balance and decreased balance strategies and therefore will continue to benefit from skilled OT intervention to enhance overall performance with BADL.  Patient  progressing toward long term goals..  Continue plan of care.  OT Short Term Goals Week 3:  OT Short Term Goal 1 (Week 3): Continue working on established goals set at supervision level overall.   Skilled Therapeutic Interventions/Progress Updates:    Pt completed shaving at the sink as well as sponge bath sit to stand during session.  He was not oriented to day of the month, but was oriented to place, situation, and day of the week initially.  He was able to complete shaving in sitting with supervision and min instructional cueing for thoroughness.  He did need cueing to remember why he transferred over to the sink originally as he became distracted combing his hair and discussing his need for a haircut.  He completed UB bathing with supervision and min instructional cueing to sequence.  He stood to wash his peri area with min assist and mod instructional cueing as he continued to perseverate on washing his front area repeatedly.  Min assist needed for donning scrub pants and new brief, with min assist for sit to stand.  He then needed supervision for donning pullover shirt.  Did not attempt washing feet this session but he was able to donn his gripper socks with mod facilitation by therapist helping him to bring up his LE and hold it there so he could donn the sock.  Finished session with transfer back to the bed.  Call button and phone in reach with bed alarm in place.  NT reported at start of session that pt was found up in the bathroom with the RW and had removed the safety belt over his head.  Alarm pad to be used from now on  in the patients wheelchair.       Therapy Documentation Precautions:  Precautions Precautions: Fall Restrictions Weight Bearing Restrictions: No  Pain: Pain Assessment Pain Scale: Faces Pain Score: 0-No pain ADL: See Care Tool Section for some details of ADL   Therapy/Group: Individual Therapy  [name] Frank 08/18/2018, 3:58 PM

## 2018-08-19 ENCOUNTER — Inpatient Hospital Stay (HOSPITAL_COMMUNITY): Payer: Medicare HMO

## 2018-08-19 ENCOUNTER — Inpatient Hospital Stay (HOSPITAL_COMMUNITY): Payer: Medicare HMO | Admitting: Occupational Therapy

## 2018-08-19 ENCOUNTER — Inpatient Hospital Stay (HOSPITAL_COMMUNITY): Payer: Medicare HMO | Admitting: Physical Therapy

## 2018-08-19 LAB — GLUCOSE, CAPILLARY
Glucose-Capillary: 186 mg/dL — ABNORMAL HIGH (ref 70–99)
Glucose-Capillary: 176 mg/dL — ABNORMAL HIGH (ref 70–99)
Glucose-Capillary: 211 mg/dL — ABNORMAL HIGH (ref 70–99)
Glucose-Capillary: 193 mg/dL — ABNORMAL HIGH (ref 70–99)

## 2018-08-19 NOTE — Progress Notes (Signed)
Occupational Therapy Session Note  Patient Details  Name: [name] Frank MRN: [id number] Date of Birth: 1950/03/30  Today's Date: 08/19/2018 OT Individual Time: 1335-1420 OT Individual Time Calculation (min): 45 min    Short Term Goals: Week 3:  OT Short Term Goal 1 (Week 3): Continue working on established goals set at supervision level overall.   Skilled Therapeutic Interventions/Progress Updates:    Pt completed bathing and dressing during session.  Min assist with increased time for functional mobility into the bathroom and for transfer to the shower bench.  Min assist for undressing with assist to also setup water temp and hand held shower.  Pt needed mod instructional cueing to begin shower as he sat holding the shower head and directing it toward his hands only.  Max assist for sequencing shower as he would wash his face repeatedly and then only wash small areas of his arms and chest and legs.  Min assist for standing balance while he washed his front and back peri area, with therapist assisting to rinse off.  Dressing was completed sit to stand at the sink with max assist secondary to decreased time.  Pt left with call button and phone in reach with safety belt in place.    Therapy Documentation Precautions:  Precautions Precautions: Fall Restrictions Weight Bearing Restrictions: No  Pain: Pain Assessment Pain Scale: Faces Pain Score: 0-No pain ADL: See Care Tool Section for some details of ADL  Therapy/Group: Individual Therapy  Loni Abdon OTR/L 08/19/2018, 4:06 PM

## 2018-08-19 NOTE — Progress Notes (Signed)
Physical Therapy Session Note  Patient Details  Name: [name] Frank MRN: [id number] Date of Birth: 01-15-1951  Today's Date: 08/19/2018 PT Individual Time: 1050-1131 PT Individual Time Calculation (min): 41 min   Short Term Goals: Week 3:  PT Short Term Goal 1 (Week 3): STG=LTG due to ELOS  Skilled Therapeutic Interventions/Progress Updates:    Pt received supine in bed, awake and alert, and upon asking to participate in therapy stated "No, I'm not doing anything today.Marland KitchenMarland KitchenIt is a joke." Patient talking more and making occasional jokes throughout therapy session with improving alertness and processing compared to this therapist's prior interactions with the patient. Supine>sit, HOB flat but using bedrails, with min assist for trunk upright and cuing for sequencing for increased pt independence. Donned shoes with mod assist for time management. Stand pivot transfer EOB>w/c using RW with min assist for balance and cuing for stepping with L LE and AD management.  Transported to/from gym in w/c. Ambulated 139ft using RW with min assist for balance and pt continues to demonstrate decreased L LE step length and foot clearance requiring manual facilitation for improved swing phase gait mechanics. Performed repeated L LE heel taps on 6" step with B UE support on RW and CGA for steadying - cuing for increased L LE hip flexion and ankle DF. Stand pivot EOM>w/c using RW with min assist for balance and cuing for L LE stepping backwards and AD management. Transported back to room in w/c and left sitting in w/c with needs in reach and seat belt alarm on.   Therapy Documentation Precautions:  Precautions Precautions: Fall Restrictions Weight Bearing Restrictions: No  Pain:   Denies pain during session.    Therapy/Group: Individual Therapy  Tawana Scale, PT, DPT 08/19/2018, 7:57 AM

## 2018-08-19 NOTE — Progress Notes (Signed)
Speech Language Pathology Daily Session Note  Patient Details  Name: [name] Frank MRN: [id number] Date of Birth: 10-06-1950  Today's Date: 08/19/2018 SLP Individual Time: 0930-1015 SLP Individual Time Calculation (min): 45 min  Short Term Goals: Week 3: SLP Short Term Goal 1 (Week 3): Pt will initiate functional task in 3 out of 5 opportunities with Mod A verbal and tactile cues.  SLP Short Term Goal 2 (Week 3): Pt will demonstrate sustained attention to tasks t for ~ 5 minutes with Mod A verbal cues for redirection.  SLP Short Term Goal 3 (Week 3): Pt will solve basic problem solving tasks with Mod A verbal cues.  SLP Short Term Goal 4 (Week 3): Pt will follow simple 2-step directions in 3 out of 5 opportunities with Mod A cues.  SLP Short Term Goal 5 (Week 3): Patient will verbalize wants/needs at the phrase level with Mod A verbal cues.  SLP Short Term Goal 6 (Week 3): Patient will name functional items with 70% accuracy with Mod A verbal cues.   Skilled Therapeutic Interventions: Skilled ST services focused on cognitive skills. SLP facilitated verbal expression and naming using word finding strategies in semi-complex picture description task, pt required min A verbal cues to expand verbal output, supervision A verbal cues to respond verbally and min A verbal cues for word finding at phrase level. Pt demonstrated strong sustained attention and expanded verbal output in the initial 10 minutes of treatment,, even demonstrating appropriate humor and engaged in banter with ST. Pt's sustained attention and verbal output decreased as session continued and tasks became more complex. SLP also facilitated basic problem solving skills in demonstrating requested amount of change, pt required mod A verbal cues for simple request (.35, .65..etc), however required max A verbal cues for more complex amounts (2.13, 5.69 ..ect.) Pt was left in room with call bell within reach and bed alarm set. ST recommends to  continue skilled ST services.      Pain Pain Assessment Pain Scale: Faces Pain Score: 0-No pain  Therapy/Group: Individual Therapy  Mallary Kreger  [facility] 08/19/2018, 4:42 PM

## 2018-08-19 NOTE — Progress Notes (Signed)
Pt has not urinated since I&O cath this am. RN attempted to toilet patient x 2. RN ran water in bathroom, and allowed pt to stand with steady assist to attempt voiding as well. Pt unable to void. Pt refusing to allow [place] to perform I&O cath. Pt educated on the importance of keeping a regular bladder program. Pt educated on risk of infection if bladder is not emptied. Pt still refusing at this time but is agreeable to attempt toile ting again in " a little while"

## 2018-08-19 NOTE — Progress Notes (Signed)
[place] PHYSICAL MEDICINE & REHABILITATION PROGRESS NOTE   Subjective/Complaints:   No issues overnite, pt is aware of d/c this week   Review of systems:limited due to decrease attn and concentration  Objective:   No results found. No results for input(s): WBC, HGB, HCT, PLT in the last 72 hours. No results for input(s): NA, K, CL, CO2, GLUCOSE, BUN, CREATININE, CALCIUM in the last 72 hours.  Intake/Output Summary (Last 24 hours) at 08/19/2018 0737 Last data filed at 08/19/2018 0634 Gross per 24 hour  Intake 720 ml  Output 850 ml  Net -130 ml     Physical Exam: Vital Signs Blood pressure 137/71, pulse (!) 47, temperature 97.9 F (36.6 C), resp. rate 17, weight 94.7 kg, SpO2 97 %. Constitutional: No distress . Vital signs reviewed. HENT: Normocephalic.  Atraumatic. Eyes: EOMI.  No discharge. Cardiovascular: No JVD. Respiratory: Normal effort. GI: Non-distended. Musc: No edema or tenderness in extremities.no shoulder pain with ROM  Neurologic: Alert Motor: 4/5 throughout Patient: Appears to have normal mood and affect. Ext No C/C/E  Assessment/Plan: 1. Functional deficits secondary to left subdural hematoma, subarachnoid hemorrhage following a fall at home which require 3+ hours per day of interdisciplinary therapy in a comprehensive inpatient rehab setting.  Physiatrist is providing close team supervision and 24 hour management of active medical problems listed below.  Physiatrist and rehab team continue to assess barriers to discharge/monitor patient progress toward functional and medical goals  Care Tool:  Bathing  Bathing activity did not occur: Refused Body parts bathed by patient: Right arm, Left arm, Chest, Abdomen, Front perineal area, Buttocks, Right upper leg, Left upper leg, Face   Body parts bathed by helper: Right lower leg, Left lower leg Body parts n/a: Right upper leg, Left upper leg, Right lower leg, Left lower leg   Bathing assist Assist Level:  Moderate Assistance - Patient 50 - 74%     Upper Body Dressing/Undressing Upper body dressing   What is the patient wearing?: Button up shirt    Upper body assist Assist Level: Minimal Assistance - Patient > 75%    Lower Body Dressing/Undressing Lower body dressing      What is the patient wearing?: Incontinence brief, Pants     Lower body assist Assist for lower body dressing: Minimal Assistance - Patient > 75%     Toileting Toileting    Toileting assist Assist for toileting: Moderate Assistance - Patient 50 - 74%     Transfers Chair/bed transfer  Transfers assist     Chair/bed transfer assist level: Minimal Assistance - Patient > 75%     Locomotion Ambulation   Ambulation assist      Assist level: Minimal Assistance - Patient > 75% Assistive device: Walker-rolling Max distance: 10'   Walk 10 feet activity   Assist     Assist level: Minimal Assistance - Patient > 75% Assistive device: Walker-rolling   Walk 50 feet activity   Assist Walk 50 feet with 2 turns activity did not occur: Safety/medical concerns  Assist level: Minimal Assistance - Patient > 75% Assistive device: Walker-rolling    Walk 150 feet activity   Assist Walk 150 feet activity did not occur: Safety/medical concerns         Walk 10 feet on uneven surface  activity   Assist Walk 10 feet on uneven surfaces activity did not occur: Safety/medical concerns         Wheelchair     Assist Will patient use wheelchair at discharge?:  No Type of Wheelchair: Manual Wheelchair activity did not occur: N/A  Wheelchair assist level: Minimal Assistance - Patient > 75% Max wheelchair distance: 15'    Wheelchair 50 feet with 2 turns activity    Assist    Wheelchair 50 feet with 2 turns activity did not occur: N/A       Wheelchair 150 feet activity     Assist Wheelchair 150 feet activity did not occur: N/A          Medical Problem List and  Plan: 1.Functional declinesecondary toAMS, 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 Exhibits signs of aphasia,cont ritalin  10mg   CIR PT, OT, SLP wife states she was providing 24/7 physical A PTA Team conf in am  2. Antithrombotics: -DVT/anticoagulation:Mechanical:Sequential compression devices, below kneeBilateral lower extremities -antiplatelet therapy: N/A 3. Pain Management:Tylenol prn. 4. Mood:LCSW to follow for evaluation and support. -antipsychotic agents: N/A 5. Neuropsych: This patientis notcapable of making decisions on hisown behalf. He has the capacity to designate a POA  6. Skin/Wound Care:routine pressure relief measures. 7. Fluids/Electrolytes/Nutrition:Monitor I/Os. 8. T2DM: Monitor BS ac/hs. SSI for elevated BS.Adjust regimen as needed.  Glucotrol 2.5 added on 5/30 Increase to 5mg  BID on 6/8  Remains labile and elevated on 6/8 CBG (last 3)  Recent Labs    08/18/18 1645 08/18/18 2120 08/19/18 0614  GLUCAP 157* 187* 186*  CBGs < 200 , improving, monitor  9. CAD/PAF/A flutter: Off Plavix due to SDH. Monitor HR bid. Continue Cardizem CD and Toprol XL. Will order orthostatic vitals due to reports of dizziness. Ordered TEDs. Continue to hold Losartan.Pt with regular rhythm at present Vitals:   08/18/18 1930 08/19/18 0505  BP: (!) 97/54 137/71  Pulse: (!) 49 (!) 47  Resp: 17 17  Temp: 98 F (36.7 C) 97.9 F (36.6 C)  SpO2: 98% 97%   Reduced cardizem to 180mg  per day on 6/8 due to bradycardia and soft BP, goal is HR>50bpm, BP in range still with brady, cont to monitor  10. Left > Right hydrocele:11. AKI with hyponateremia: Resolved   12. New onset seizures: Continue Keppra 750 mg bid.Overly sedated on 1000mg    Keppra has been increased due to suspected seizure activity but no observe seizures   13.  Urinary retention: Continue Flomax monitor for orthostatic  hypotension  LOS: 18 days A FACE TO FACE EVALUATION WAS PERFORMED  Charlett Blake 08/19/2018, 7:37 AM

## 2018-08-19 NOTE — Progress Notes (Signed)
Physical Therapy Session Note  Patient Details  Name: [name] Frank MRN: [id number] Date of Birth: 09-04-1950  Today's Date: 08/19/2018 PT Individual Time: 1600-1700 PT Individual Time Calculation (min): 60 min   Short Term Goals: Week 3:  PT Short Term Goal 1 (Week 3): STG=LTG due to ELOS  Skilled Therapeutic Interventions/Progress Updates:    Pt seated in w/c upon PT arrival, agreeable to therapy tx and denies pain. Pt seated in w/c while therapist donned shoes total assist for time management. Pt transported to the gym. Pt participated in body weight supported treadmill training this session with use of litegait for gait training. Pt performed sit<>stand with RW and min assist to don harness, min assist+2 to step up on the treadmill. Pt performed treadmill training as follows:  1:05 minutes, 50 ft of forwards ambulation at 0.5 mph with B UE support 1:38 minutes, 56 ft of forwards ambulation at 0.4 mph with B UE support 1:58 minutes, 112 ft of forwards ambulation at 0.6-0.7 mph with B UE support All of these bouts with cues for increased step length, manual facilitation for L increased step length 80% of the time and facilitation for increased R step length with assist from second helper 30% of the time, cues for heel strike. Therapist providing education throughout for why we are working on increased step length, pt with questions this session about what we were working on.   Pt then participated in overground gait training following treadmill training to assess for carryover. Pt ambulated 2 x 60 ft with RW and min assist, continues to require cues for increased step length and L heel strike, therapist cues pt to stop and correct shuffling rather than continuing to shuffle. Pt left in w/c at end of session in room with chair alarm set and needs in reach.   Therapy Documentation Precautions:  Precautions Precautions: Fall Restrictions Weight Bearing Restrictions: No    Therapy/Group:  Individual Therapy  Netta Corrigan, PT, DPT 08/19/2018, 7:52 AM

## 2018-08-20 ENCOUNTER — Encounter (HOSPITAL_COMMUNITY): Payer: Medicare HMO | Admitting: Occupational Therapy

## 2018-08-20 ENCOUNTER — Inpatient Hospital Stay (HOSPITAL_COMMUNITY): Payer: Medicare HMO

## 2018-08-20 ENCOUNTER — Ambulatory Visit (HOSPITAL_COMMUNITY): Payer: Medicare HMO

## 2018-08-20 ENCOUNTER — Encounter (HOSPITAL_COMMUNITY): Payer: Medicare HMO | Admitting: Speech Pathology

## 2018-08-20 LAB — GLUCOSE, CAPILLARY
Glucose-Capillary: 173 mg/dL — ABNORMAL HIGH (ref 70–99)
Glucose-Capillary: 211 mg/dL — ABNORMAL HIGH (ref 70–99)
Glucose-Capillary: 178 mg/dL — ABNORMAL HIGH (ref 70–99)
Glucose-Capillary: 212 mg/dL — ABNORMAL HIGH (ref 70–99)

## 2018-08-20 IMAGING — CT CT HEAD WITHOUT CONTRAST
4 series · 16 of 47 positions shown, 18 images · non-contrast
Comparison: Prior CT from [DATE].

CLINICAL DATA: Initial evaluation for acute altered mental status,
unexplained fall.

EXAM:
CT HEAD WITHOUT CONTRAST
TECHNIQUE: Contiguous axial images were obtained from the base of the skull
through the vertex without intravenous contrast.

[Series 2: head wo · axial · 0.46mm/px · z∈[-100,+25]mm · 7 of 35 slices shown, 9 images]
[im 5/35  brain]
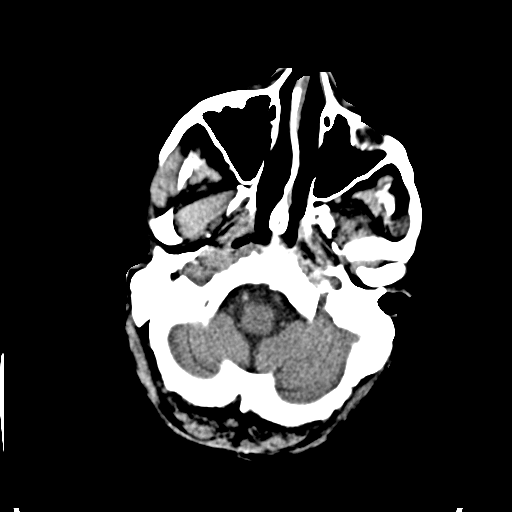
[im 5/35  bone]
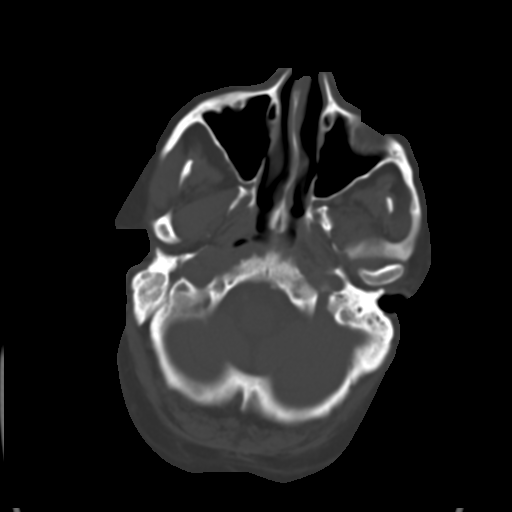
[im 9/35  brain]
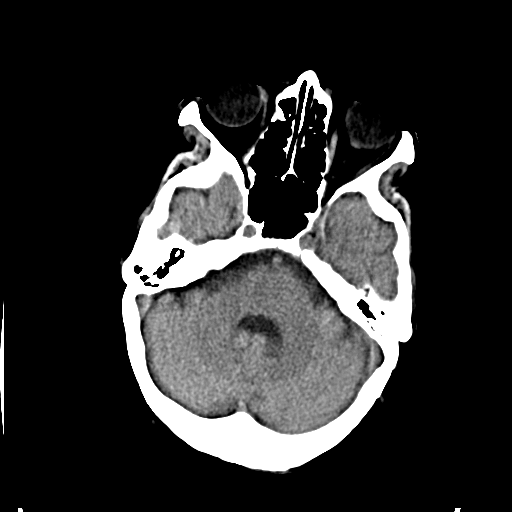
[im 13/35  brain]
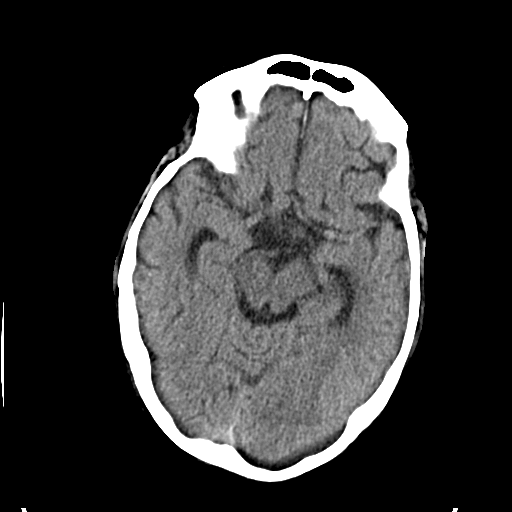
[im 18/35  brain]
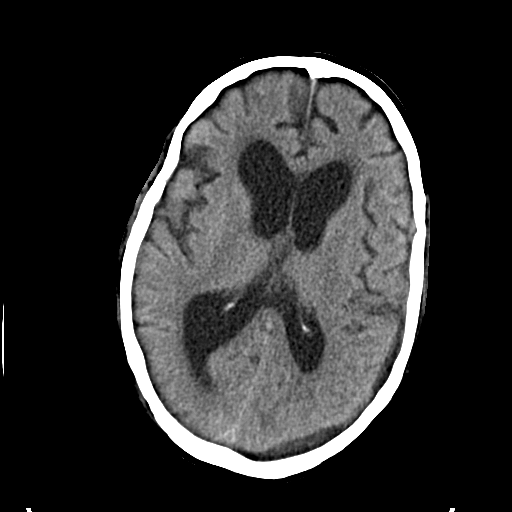
[im 22/35  brain]
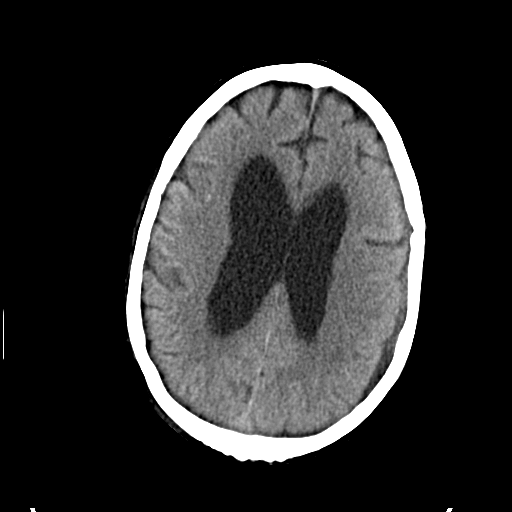
[im 22/35  bone]
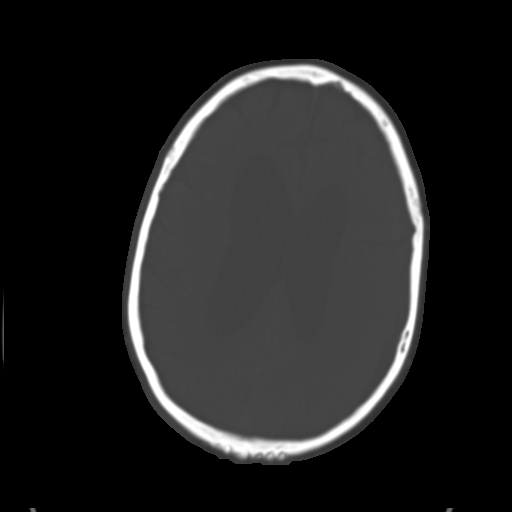
[im 26/35  brain]
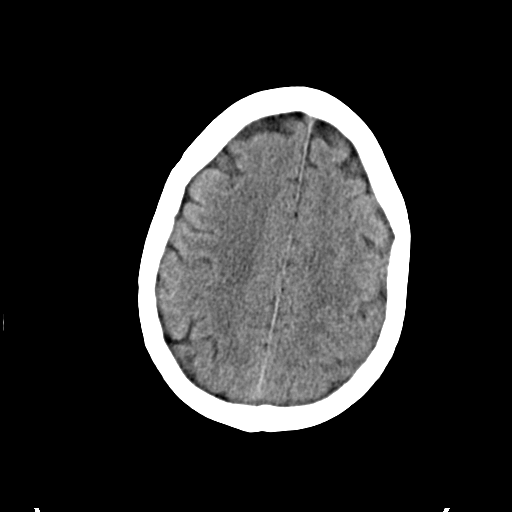
[im 30/35  brain]
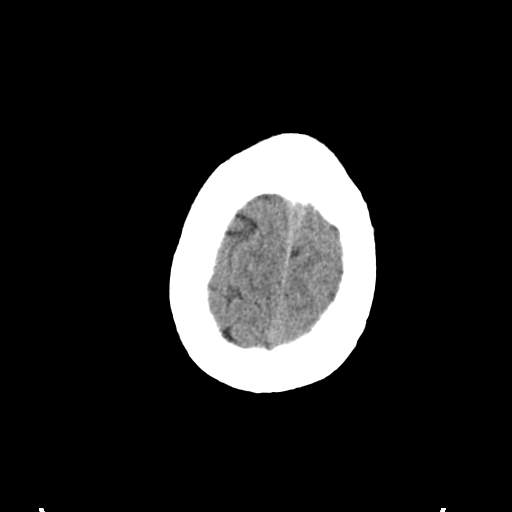

[Series 3: head bone · axial · 0.46mm/px · z∈[-104,-68]mm · 3 of 88 slices shown]
[im 9/88  bone]
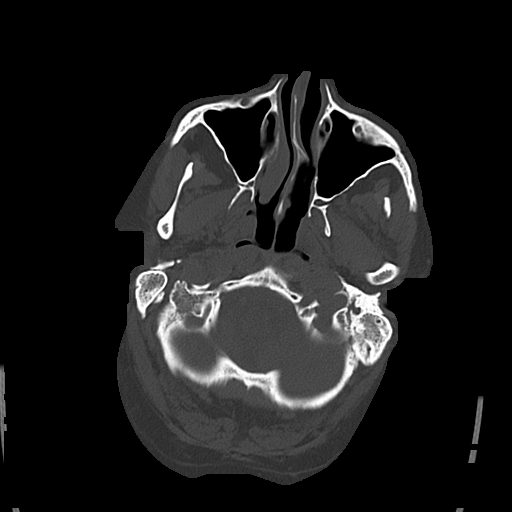
[im 18/88  bone]
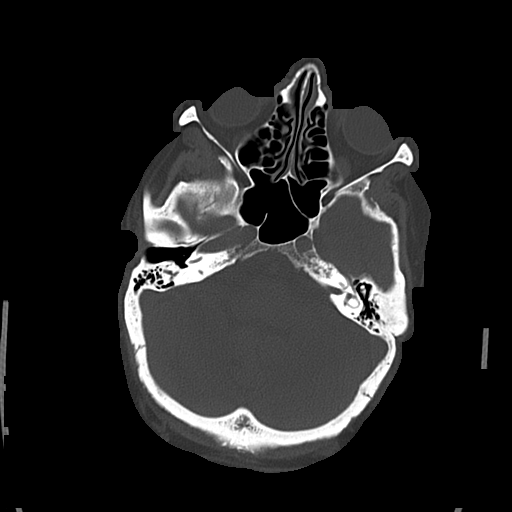
[im 27/88  bone]
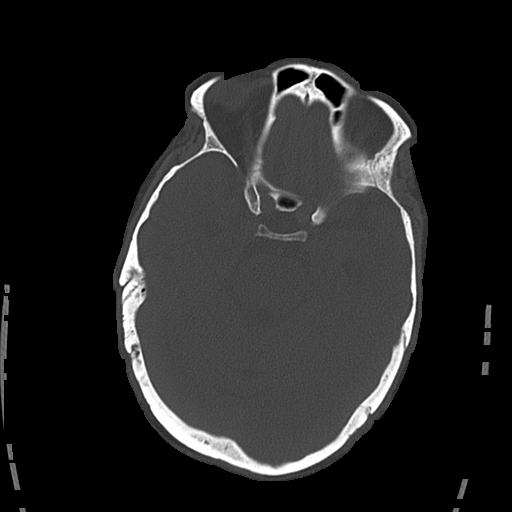

[Series 4: cor soft · coronal · 0.34mm/px · 3 of 77 slices shown]
[im 26/77  brain]
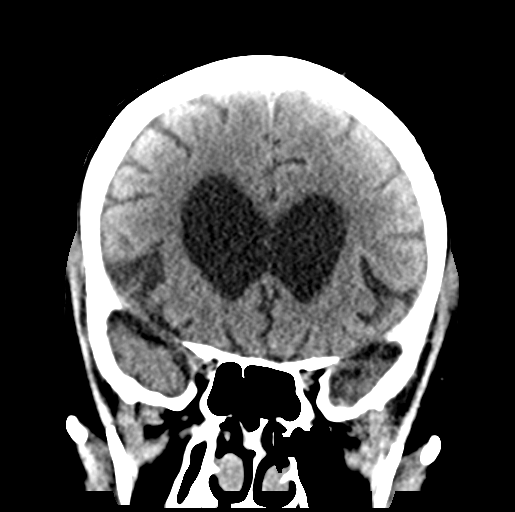
[im 34/77  brain]
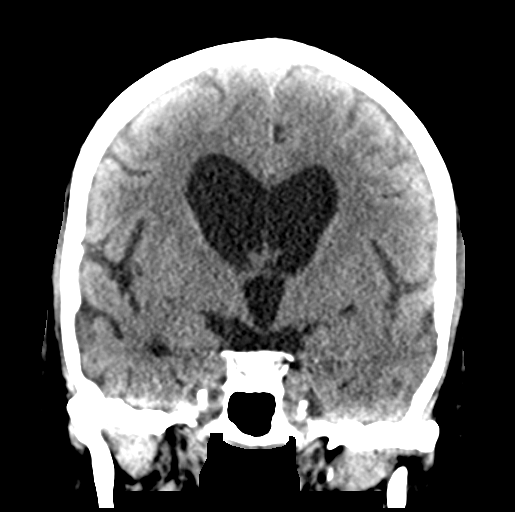
[im 43/77  brain]
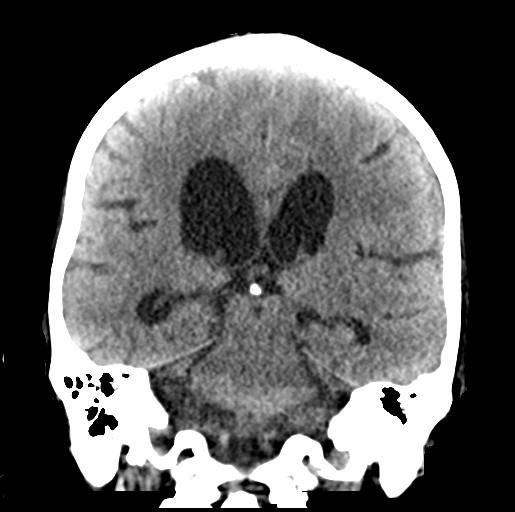

[Series 5: sag soft · sagittal · 0.36mm/px · 3 of 61 slices shown]
[im 21/61  brain]
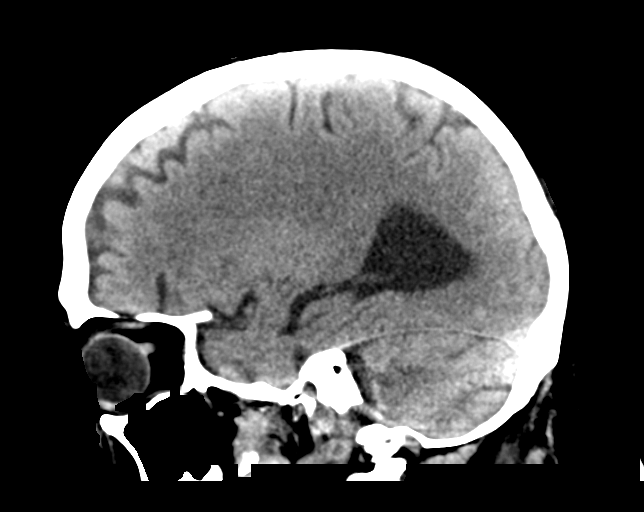
[im 31/61  brain]
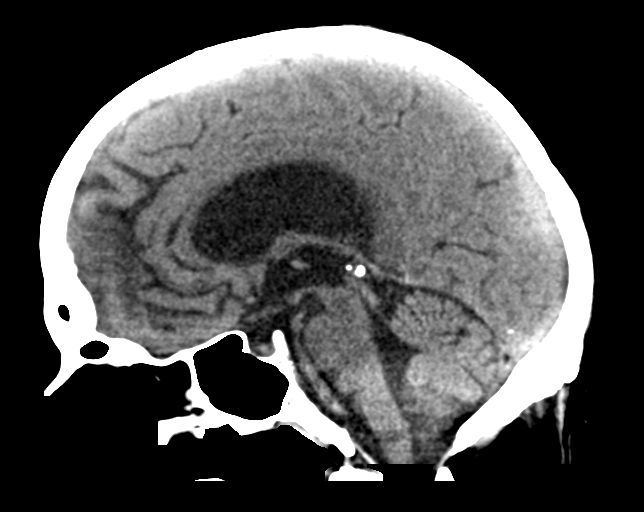
[im 41/61  brain]
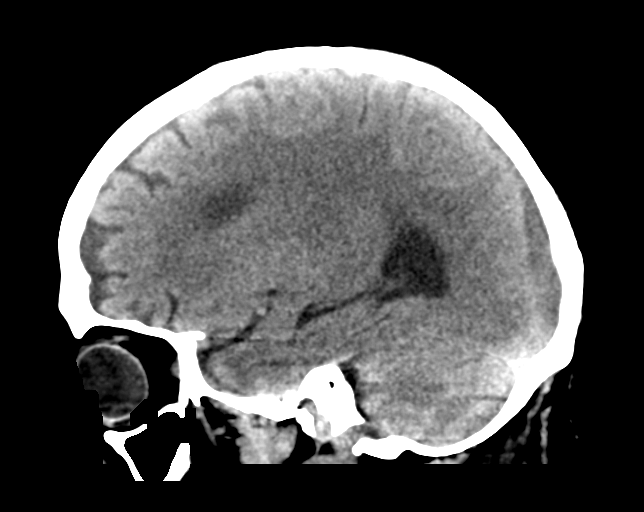

[16 of 47 positions shown; findings below may reference images not displayed]

FINDINGS: Brain: Previously identified left holo hemispheric subdural
collection is decreased in size, now measuring up to 6 mm in maximal
thickness at the level of the left frontal parietal convexity.
Internal more hyperdense components of this collection are also
decreased, with the collection now appearing more hypodense as
compared to previous. No evidence for interval bleeding. Persistent
minimal mass effect on the subjacent left cerebral hemisphere
without midline shift. Basilar cisterns remain patent.

No acute intracranial hemorrhage. No acute large vessel territory
infarct. No mass lesion. Previously seen intraventricular hemorrhage
has resolved, with no hemorrhage now seen. Ventricular dilatation
relatively stable as compared to [DATE], but increased relative
to [DATE]. No significant periventricular hypodensity to suggest
transependymal flow of CSF.

Vascular: No hyperdense vessel. Scattered vascular calcifications
noted within the carotid siphons.

Skull: Scalp soft tissues within normal limits. Calvarium intact.

Sinuses/Orbits: Globes and orbital soft tissues within normal
limits. Visualized paranasal sinuses are largely clear. No mastoid
effusion.

Other: None.
IMPRESSION: 1. Interval decrease in size of left holo hemispheric subdural
collection, now measuring up to 6 mm in maximal thickness. No
evidence for interval bleeding or other acute finding. Persistent
but improved mass effect on the subjacent left cerebral hemisphere
without midline shift.
2. Interval resolution of previously seen intraventricular
hemorrhage. Ventriculomegaly is relatively stable as compared to
[DATE], but increased relative to [DATE]. No findings to
suggest transependymal flow of CSF.
3. No other new acute intracranial process.

## 2018-08-20 MED ORDER — GLIPIZIDE 5 MG PO TABS
10.0000 mg | ORAL_TABLET | Freq: Two times a day (BID) | ORAL | Status: DC
  Administered 2018-08-20 – 2018-08-22 (×4): 10 mg via ORAL
  Filled 2018-08-20 (×4): qty 2

## 2018-08-20 MED ORDER — LEVETIRACETAM 750 MG PO TABS
750.0000 mg | ORAL_TABLET | Freq: Two times a day (BID) | ORAL | Status: DC
  Administered 2018-08-20 – 2018-08-22 (×4): 750 mg via ORAL
  Filled 2018-08-20 (×4): qty 1

## 2018-08-20 NOTE — Progress Notes (Addendum)
Speech Language Pathology Daily Session Note  Patient Details  Name: MARCUS SCHWANDT MRN: [id number] Date of Birth: 29-Mar-1950  Today's Date: 08/20/2018 SLP Individual Time: 0955-1030 SLP Individual Time Calculation (min): 35 min  Short Term Goals: Week 3: SLP Short Term Goal 1 (Week 3): Pt will initiate functional task in 3 out of 5 opportunities with Mod A verbal and tactile cues.  SLP Short Term Goal 2 (Week 3): Pt will demonstrate sustained attention to tasks t for ~ 5 minutes with Mod A verbal cues for redirection.  SLP Short Term Goal 3 (Week 3): Pt will solve basic problem solving tasks with Mod A verbal cues.  SLP Short Term Goal 4 (Week 3): Pt will follow simple 2-step directions in 3 out of 5 opportunities with Mod A cues.  SLP Short Term Goal 5 (Week 3): Patient will verbalize wants/needs at the phrase level with Mod A verbal cues.  SLP Short Term Goal 6 (Week 3): Patient will name functional items with 70% accuracy with Mod A verbal cues.   Skilled Therapeutic Interventions:  Skilled treatment session focused on completing caregiver education with pt and his wife. SLP provided education on Mod A (50% help) support required for all activities and for active 24 hour supervision with support needed for medicine, money and need to decrease distractions.  Pt's cognitive deficits present during therapy session, instances pointed out in decreased short-term memory and lack of insight. Uncertain if wife was retaining information as she appeared distracted and benefited from cues to refocus on information being presented.      Pain Pain Assessment Pain Scale: 0-10 Pain Score: 0-No pain  Therapy/Group: Individual Therapy  Nuriya Stuck 08/20/2018, 10:54 AM

## 2018-08-20 NOTE — Progress Notes (Signed)
Occupational Therapy Session Note  Patient Details  Name: [name] Frank MRN: [id number] Date of Birth: August 23, 1950  Today's Date: 08/20/2018 OT Individual Time: 8003-4917 OT Individual Time Calculation (min): 53 min    Short Term Goals: Week 3:  OT Short Term Goal 1 (Week 3): Continue working on established goals set at supervision level overall.   Skilled Therapeutic Interventions/Progress Updates:    Pt completed bathing and dressing with his spouse present.  Pt needed min assist for ambulation into the shower with use of the RW for support.  Once in the shower he was able to remove clothing with mod assist and complete UB bathing with supervision and mod instructional cueing.  LB bathing with min assist sit to stand as well.  Pt incontinent of bowel and bladder as well when in the shower.  He transferred out to the wheelchair with min assist.  Increased lean and LOB to the left when turning to sit on the wheelchair.  He completed UB dressing with min assist and increased time.  Mod assist for LB dressing to donn socks and shoes.  Pt's wife did not complete much hands on secondary to limited time.  Will schedule family ed again tomorrow for more hands on.  Pt left with spouse and SLP for next session while sitting up in the wheelchair.    Therapy Documentation Precautions:  Precautions Precautions: Fall Restrictions Weight Bearing Restrictions: No  Pain: Pain Assessment Pain Scale: Faces Pain Score: 0-No pain ADL: See Care Tool Section for some details of ADL  Therapy/Group: Individual Therapy  Nayleah Gamel OTR/L 08/20/2018, 11:42 AM

## 2018-08-20 NOTE — Progress Notes (Signed)
[facility] PHYSICAL MEDICINE & REHABILITATION PROGRESS NOTE   Subjective/Complaints:  Family / caregiver training started today Improved orientation , occ incont   Review of systems:limited due to decrease attn and concentration  Objective:   No results found. No results for input(s): WBC, HGB, HCT, PLT in the last 72 hours. No results for input(s): NA, K, CL, CO2, GLUCOSE, BUN, CREATININE, CALCIUM in the last 72 hours.  Intake/Output Summary (Last 24 hours) at 08/20/2018 1126 Last data filed at 08/20/2018 0831 Gross per 24 hour  Intake 1078 ml  Output -  Net 1078 ml     Physical Exam: Vital Signs Blood pressure 128/64, pulse (!) 51, temperature 98 F (36.7 C), temperature source Oral, resp. rate 16, weight 92.7 kg, SpO2 100 %. Constitutional: No distress . Vital signs reviewed. HENT: Normocephalic.  Atraumatic. Eyes: EOMI.  No discharge. Cardiovascular: No JVD. Respiratory: Normal effort. GI: Non-distended. Musc: No edema or tenderness in extremities.no shoulder pain with ROM  Neurologic: Alert Motor: 4/5 throughout Patient: Appears to have normal mood and affect. Oriented x 3  Ext No C/C/E  Assessment/Plan: 1. Functional deficits secondary to left subdural hematoma, subarachnoid hemorrhage following a fall at home which require 3+ hours per day of interdisciplinary therapy in a comprehensive inpatient rehab setting.  Physiatrist is providing close team supervision and 24 hour management of active medical problems listed below.  Physiatrist and rehab team continue to assess barriers to discharge/monitor patient progress toward functional and medical goals  Care Tool:  Bathing  Bathing activity did not occur: Refused Body parts bathed by patient: Right arm, Left arm, Chest, Abdomen, Front perineal area, Buttocks, Right upper leg, Left upper leg, Face, Right lower leg, Left lower leg   Body parts bathed by helper: Right lower leg, Left lower leg Body parts n/a: Right  upper leg, Left upper leg, Right lower leg, Left lower leg   Bathing assist Assist Level: Minimal Assistance - Patient > 75%     Upper Body Dressing/Undressing Upper body dressing   What is the patient wearing?: Button up shirt    Upper body assist Assist Level: Maximal Assistance - Patient 25 - 49%(secondary to time)    Lower Body Dressing/Undressing Lower body dressing      What is the patient wearing?: Incontinence brief, Pants     Lower body assist Assist for lower body dressing: Maximal Assistance - Patient 25 - 49%(secondary to time)     Toileting Toileting    Toileting assist Assist for toileting: Moderate Assistance - Patient 50 - 74%     Transfers Chair/bed transfer  Transfers assist     Chair/bed transfer assist level: Minimal Assistance - Patient > 75%     Locomotion Ambulation   Ambulation assist      Assist level: Minimal Assistance - Patient > 75% Assistive device: Walker-rolling Max distance: 60 ft   Walk 10 feet activity   Assist     Assist level: Minimal Assistance - Patient > 75% Assistive device: Walker-rolling   Walk 50 feet activity   Assist Walk 50 feet with 2 turns activity did not occur: Safety/medical concerns  Assist level: Minimal Assistance - Patient > 75% Assistive device: Walker-rolling    Walk 150 feet activity   Assist Walk 150 feet activity did not occur: Safety/medical concerns         Walk 10 feet on uneven surface  activity   Assist Walk 10 feet on uneven surfaces activity did not occur: Safety/medical concerns           Wheelchair     Assist Will patient use wheelchair at discharge?: No Type of Wheelchair: Manual Wheelchair activity did not occur: N/A  Wheelchair assist level: Minimal Assistance - Patient > 75% Max wheelchair distance: 40'    Wheelchair 50 feet with 2 turns activity    Assist    Wheelchair 50 feet with 2 turns activity did not occur: N/A       Wheelchair  150 feet activity     Assist Wheelchair 150 feet activity did not occur: N/A          Medical Problem List and Plan: 1.Functional declinesecondary toAMS, 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 Exhibits signs of aphasia,cont ritalin  10mg  CIR PT, OT, SLP wife states she was providing 24/7 physical A PTA Team conference today please see physician documentation under team conference tab, met with team face-to-face to discuss problems,progress, and goals. Formulized individual treatment plan based on medical history, underlying problem and comorbidities. 2. Antithrombotics: -DVT/anticoagulation:Mechanical:Sequential compression devices, below kneeBilateral lower extremities -antiplatelet therapy: N/A 3. Pain Management:Tylenol prn. 4. Mood:LCSW to follow for evaluation and support. -antipsychotic agents: N/A 5. Neuropsych: This patientis notcapable of making decisions on hisown behalf. He has the capacity to designate a POA  6. Skin/Wound Care:routine pressure relief measures. 7. Fluids/Electrolytes/Nutrition:Monitor I/Os. 8. T2DM: Monitor BS ac/hs. SSI for elevated BS.Adjust regimen as needed.  Glucotrol 2.5 added on 5/30 Increase to 5mg BID on 6/8  Remains labile and elevated on 6/8 CBG (last 3)  Recent Labs    08/19/18 1714 08/19/18 2108 08/20/18 0623  GLUCAP 211* 193* 173*  CBGs < 200 , improving, but still elevated , home dose glucotrol 10mg will increase dose 9. CAD/PAF/A flutter: Off Plavix due to SDH. Monitor HR bid. Continue Cardizem CD and Toprol XL. Will order orthostatic vitals due to reports of dizziness. Ordered TEDs. Continue to hold Losartan.Pt with regular rhythm at present Vitals:   08/19/18 1940 08/20/18 0507  BP:  128/64  Pulse:  (!) 51  Resp: 18 16  Temp: 99.2 F (37.3 C) 98 F (36.7 C)  SpO2: 100% 100%   Reduced cardizem to 180mg per day on  6/8 due to bradycardia and soft BP, goal is HR>50bpm, BP in range still with brady, cont to monitor  10. Left > Right hydrocele:11. AKI with hyponateremia: Resolved   12. New onset seizures: Continue Keppra 750 mg bid.Overly sedated on 1000mg   Keppra has been increased due to suspected seizure activity but no observe seizures   13.  Urinary retention: Continue Flomax monitor for orthostatic hypotension  LOS: 19 days A FACE TO FACE EVALUATION WAS PERFORMED  Andrew E Kirsteins 08/20/2018, 11:26 AM    

## 2018-08-20 NOTE — Progress Notes (Signed)
Physical Therapy Session Note  Patient Details  Name: [name] Frank MRN: [id number] Date of Birth: [birth year]  Today's Date: 08/20/2018 PT Individual Time: 1031-1100 and 1431-1544 PT Individual Time Calculation (min): 29 min 73 min   Short Term Goals: Week 3:  PT Short Term Goal 1 (Week 3): STG=LTG due to ELOS  Skilled Therapeutic Interventions/Progress Updates:    Session 1: Pt seated in w/c upon PT arrival, agreeable to therapy tx and denies pain. Pt's wife present throughout session for family education. Pt transported to the gym in w/c. Discussed getting a w/c for d/c, reviewed how to apply brakes. Pt ascended/descended 4 steps this session with B UE support on single rail per home set up, pt performed x 1 with assist from therapist and therapist providing education throughout on techniques and cues for wife to provide. Pt ascended/descended 4 steps with assist from wife, therapist providing min cues throughout. Pt ambulated x 60 ft with RW and assist from therapist, ambulated x 40 ft with RW and assist from wife,  therapist providing education throughout on techniques and cues for wife to provide including increased step length and foot clearance. Pt left in w/c at end of session with needs in reach and chair alarm set, wife present.   Session 2: Pt supine in bed upon PT arrival, agreeable to therapy tx and denies pain. Pt spent the beginning of the session focusing on performing bed mobility. Pt transferred from supine>sitting without use of bedrails to simulate home set up, max assist and cues for techniques. Pt seated EOB while discussing home set up, pt reports that he actually gets up on the R side on the bed. Pt transferred back to supine with min assist and then worked on transferred from supine<>sitting getting up on R side of the bed. Pt performed supine<>sitting x 1 with mod assist and cues for techniques/sequencing, therapist educated pt to roll on his side first and then push up from  there. Pt performed again supine<>sitting this time with supervision, however increased time to complete secondary to decreased initiation and poor motor planning, therapist providing verbal cues. Pt performed supine<>sitting again, this time with min assist, pt demonstrates rocking technique and swing his legs down with momentum to push his upper body up to sitting. Pt asking therapist this session about his injury, prognosis and return to driving. Therapist provided education this session regarding his brain injury prognosis, extent of his injury, importance of continued therapy and educating on getting doctors approval prior to any future driving which may take up to 6 months or more. PT reports feeling discouraged, also reports feeling like he is in denial, therapist provided emotional support. Pt ambulated 2 x 80 ft with min assist and RW, emphasis on increased step length, toe cap added to L shoe however pt seems to continue catching at midfoot. Pt seated in w/c asks for his phone, he reports he is going to call his wife to find somewhere to go out to eat when he gets home, therapist educated pt that he should work on staying home and get adjusted to being home first, pt agreeable. Pt left in w/c at end of session with needs in reach and chair alarm set.    Therapy Documentation Precautions:  Precautions Precautions: Fall Restrictions Weight Bearing Restrictions: (P) No    Therapy/Group: Individual Therapy  Netta Corrigan, PT, DPT 08/20/2018, 8:01 AM

## 2018-08-20 NOTE — Progress Notes (Signed)
   08/20/18 1900  What Happened  Was fall witnessed? No  Was patient injured? Yes  Patient found on floor  Found by Staff-comment (hospitality staff picking up trays)  Follow Up  MD notified  [name] Frank)  Time MD notified 1900  Family notified Yes-comment  Time family notified 1930  Additional tests Yes-comment (CT scan)  Simple treatment Other (comment) (skin scrape to rt knee, not bleeding)  Adult Fall Risk Assessment  Risk Factor Category (scoring not indicated) Fall has occurred during this admission (document High fall risk)  Patient Fall Risk Level High fall risk

## 2018-08-20 NOTE — Patient Care Conference (Signed)
Inpatient RehabilitationTeam Conference and Plan of Care Update Date: 08/20/2018   Time: 11:15 AM    Patient Name: [name] Frank      Medical Record Number: [id number]  Date of Birth: [birth year] Sex: Male         Room/Bed: 4W12C/4W12C-01 Payor Info: Payor: [employer] MEDICARE / Plan: [place] HMO / Product Type: *No Product type* /    Admitting Diagnosis: CVA 2 Team  Closed TBI; 12-14days  Admit Date/Time:  08/01/2018  8:00 PM Admission Comments: No comment available   Primary Diagnosis:  <principal problem not specified> Principal Problem: <principal problem not specified>  Patient Active Problem List   Diagnosis Date Noted  . Bradycardia   . Leukopenia   . Hypoalbuminemia due to protein-calorie malnutrition ([place])   . Urinary retention   . Hydrocele   . Diabetes mellitus type 2 in obese ([place])   . Somnolence   . SDH (subdural hematoma) ([place]) 08/01/2018  . Hydrocephalus in adult [facility]) 07/29/2018  . Hydrocephalus ([place]) 07/28/2018  . Mild renal insufficiency 07/28/2018  . Paroxysmal atrial fibrillation ([place]) 07/28/2018  . Subdural hematoma ([place]) 07/14/2018  . Hypertension, essential 01/16/2018  . Controlled type 2 diabetes mellitus without complication, without long-term current use of insulin ([place]) 01/16/2018  . Coronary artery disease involving native heart without angina pectoris 01/16/2018  . Diabetes mellitus type II, non insulin dependent ([place]) 01/13/2011    Expected Discharge Date: Expected Discharge Date: 08/23/18  Team Members Present: Physician leading conference: Dr. Alysia Penna Social Worker Present: Ovidio Kin, LCSW Nurse Present: Frances Maywood, RN PT Present: Michaelene Song, PT OT Present: Clyda Greener, OT SLP Present: Stormy Fabian, SLP PPS Coordinator present : Ileana Ladd, PT     Current Status/Progress Goal Weekly Team Focus  Medical   last I/O cath 2 d ago, enc pt to sit on commode, CBG increased   maintain med stability  D/C planning    Bowel/Bladder   LBM 6/9 I&O q6 for no void. patient voiding more and being cathed less.  Regain bladder function, less cathing. Less periods of bowel incontinence.  assess toileting needs qshift and PRN   Swallow/Nutrition/ Hydration             ADL's   superviison for UB bathing and dressing with min assist for showering sit to stand.  Max instructional cueing for sequencing bathing secondary to decreased sustained attention.  Min assist for shower transfer and for transfer to the 3:1  supervision overall  selfcare retraining, balance retraining, cognitive retraining, transfer training, pt/family education   Mobility   min-mod assist bed mobility, min assist for sit<>stands, transfers and gait with RW x 50 ft  CGA-min assist  attention, awareness, gait, transfers, balance, family education   Communication             Safety/Cognition/ Behavioral Observations  Mod A  Mod A -downgraded 6/3  increasing verbal output, sustained attention, basic problem sovling   Pain   no complaints of pain  continue to be free of pain  assess apin qshift and PRN   Skin   no skin impairments  no skin break down  assess skin qshift and prn      *See Care Plan and progress notes for long and short-term goals.     Barriers to Discharge  Current Status/Progress Possible Resolutions Date Resolved   Physician    Medical stability;Incontinence     progressing toward d/c  goals are downgraded  Nursing                  PT                    OT                  SLP                SW                Discharge Planning/Teaching Needs:  Wife coming in today for education aware husband will need 24 hr physical care at discharge. See how it goes today with wife.      Team Discussion:  Progressing toward his goals of supervision-min level. Needs max cues. Still has attention and awareness issues. MD adjusting BP and BS meds. Wife is here for education today in preparation for DC and is coming back  tomorrow for further education. Has refused cath's, bladder scans nursing doing  Revisions to Treatment Plan:  DC 6/12 Downgraded goals to min assist    Continued Need for Acute Rehabilitation Level of Care: The patient requires daily medical management by a physician with specialized training in physical medicine and rehabilitation for the following conditions: Daily direction of a multidisciplinary physical rehabilitation program to ensure safe treatment while eliciting the highest outcome that is of practical value to the patient.: Yes Daily medical management of patient stability for increased activity during participation in an intensive rehabilitation regime.: Yes Daily analysis of laboratory values and/or radiology reports with any subsequent need for medication adjustment of medical intervention for : Neurological problems;Mood/behavior problems;Nutritional problems   I attest that I was present, lead the team conference, and concur with the assessment and plan of the team. Teleconference held due to COVID 19   Novia Lansberry, Lemar LivingsRebecca G 08/20/2018, 1:50 PM

## 2018-08-21 ENCOUNTER — Inpatient Hospital Stay (HOSPITAL_COMMUNITY): Payer: Medicare HMO

## 2018-08-21 ENCOUNTER — Inpatient Hospital Stay (HOSPITAL_COMMUNITY): Payer: Medicare HMO | Admitting: Occupational Therapy

## 2018-08-21 ENCOUNTER — Inpatient Hospital Stay (HOSPITAL_COMMUNITY): Payer: Medicare HMO | Admitting: Speech Pathology

## 2018-08-21 ENCOUNTER — Encounter (HOSPITAL_COMMUNITY): Payer: Medicare HMO | Admitting: Occupational Therapy

## 2018-08-21 ENCOUNTER — Ambulatory Visit (HOSPITAL_COMMUNITY): Payer: Medicare HMO | Admitting: Physical Therapy

## 2018-08-21 LAB — GLUCOSE, CAPILLARY
Glucose-Capillary: 150 mg/dL — ABNORMAL HIGH (ref 70–99)
Glucose-Capillary: 214 mg/dL — ABNORMAL HIGH (ref 70–99)
Glucose-Capillary: 245 mg/dL — ABNORMAL HIGH (ref 70–99)
Glucose-Capillary: 234 mg/dL — ABNORMAL HIGH (ref 70–99)

## 2018-08-21 IMAGING — CT CT HEAD WITHOUT CONTRAST
3 of 4 series · 14 of 47 positions shown, 16 images · non-contrast
Comparison: Prior CT from [DATE].

CLINICAL DATA: Follow-up examination for subdural hemorrhage.

EXAM:
CT HEAD WITHOUT CONTRAST
TECHNIQUE: Contiguous axial images were obtained from the base of the skull
through the vertex without intravenous contrast.

[Series 3: head 2.0 h70h · axial · 0.45mm/px · z∈[-201,-73]mm · 8 of 81 slices shown, 10 images]
[im 9/81  brain]
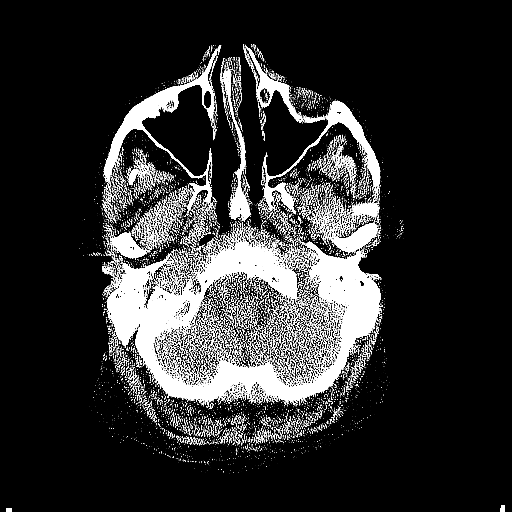
[im 9/81  bone]
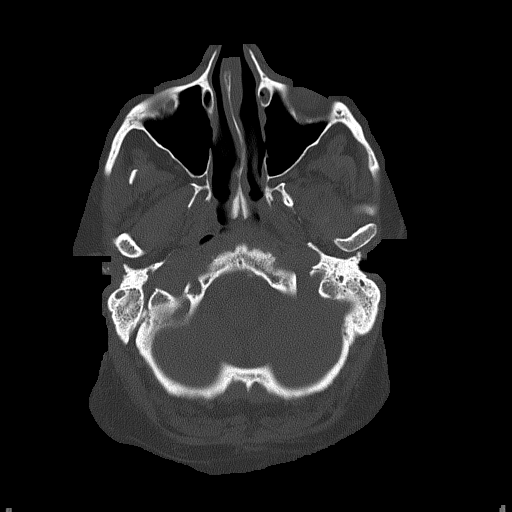
[im 17/81  brain]
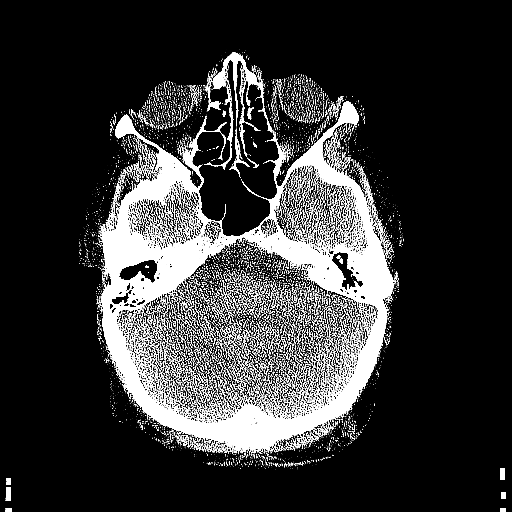
[im 25/81  brain]
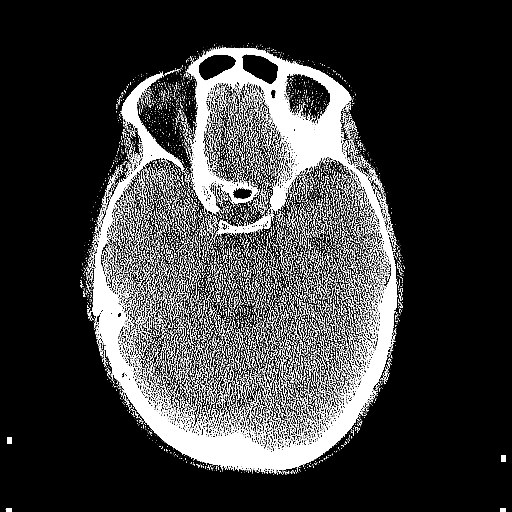
[im 37/81  brain]
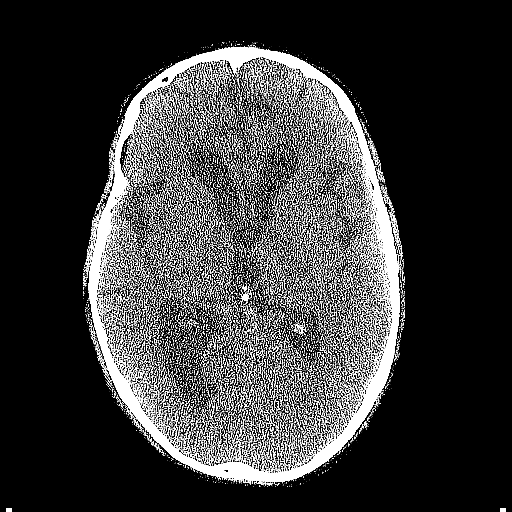
[im 45/81  brain]
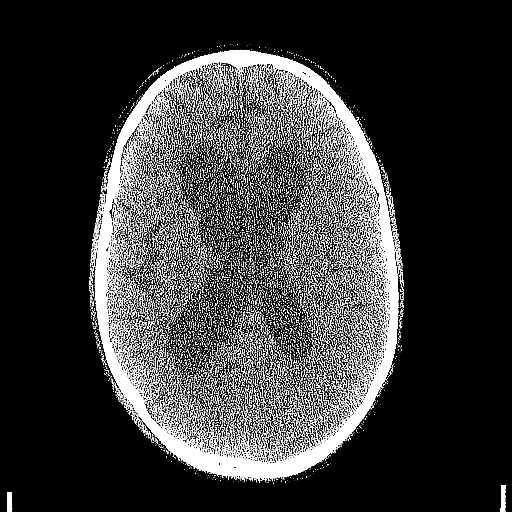
[im 45/81  bone]
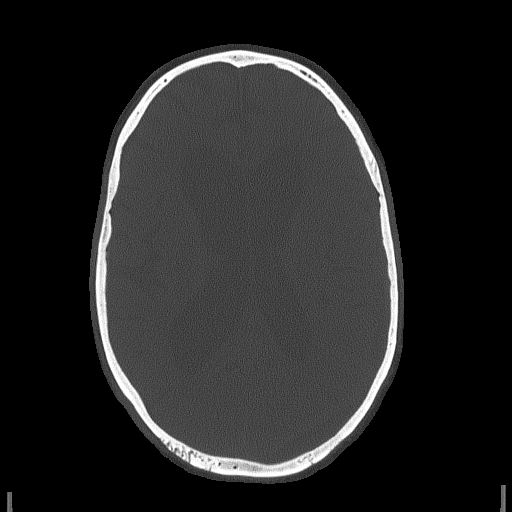
[im 57/81  brain]
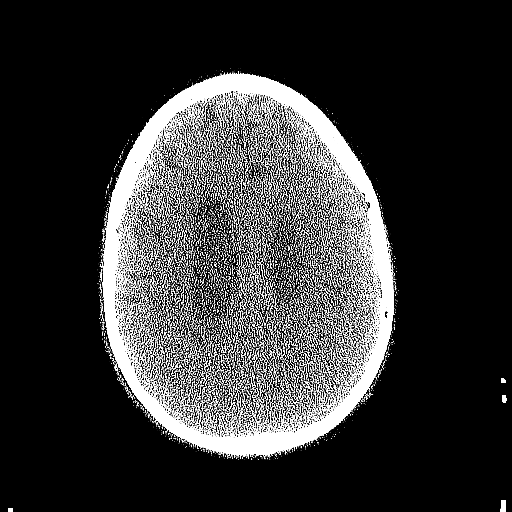
[im 65/81  brain]
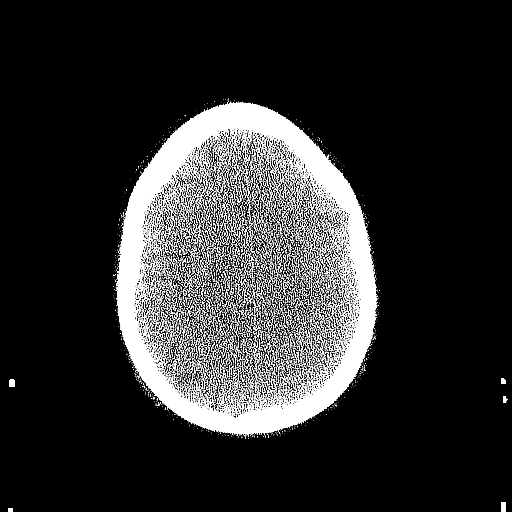
[im 73/81  brain]
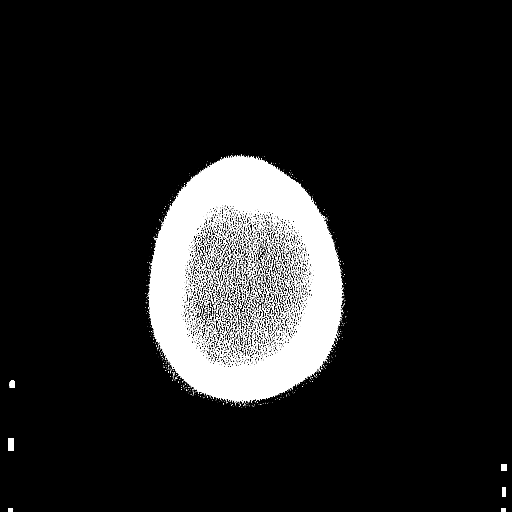

[Series 4: head 3.0 mpr cor · coronal · 0.33mm/px · 3 of 76 slices shown]
[im 26/76  brain]
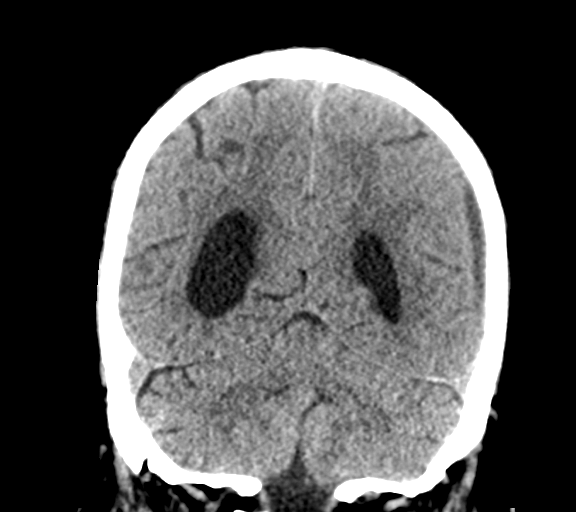
[im 34/76  brain]
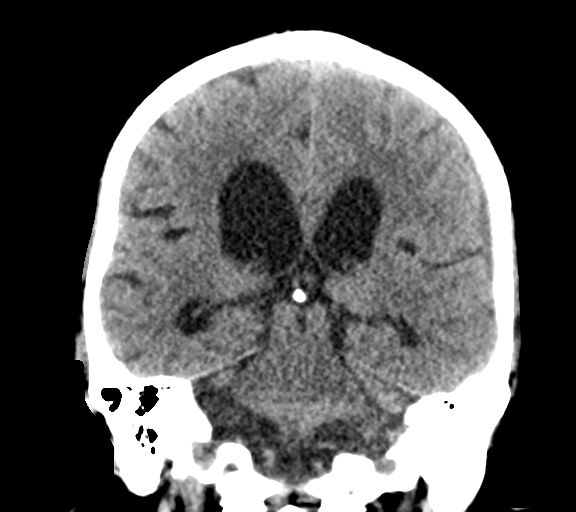
[im 42/76  brain]
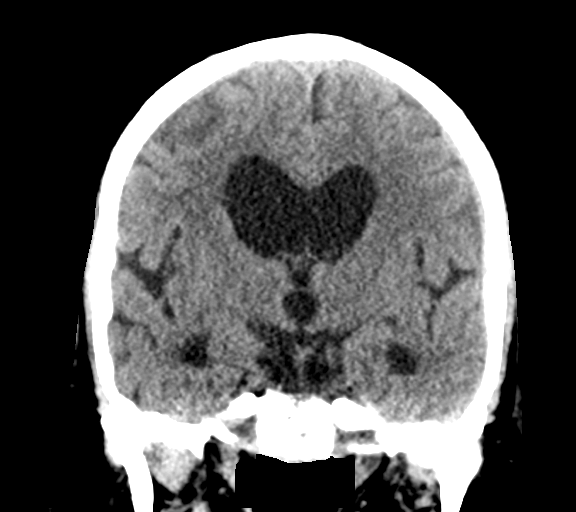

[Series 5: head 3.0 mpr sag · sagittal · 0.33mm/px · 3 of 59 slices shown]
[im 20/59  brain]
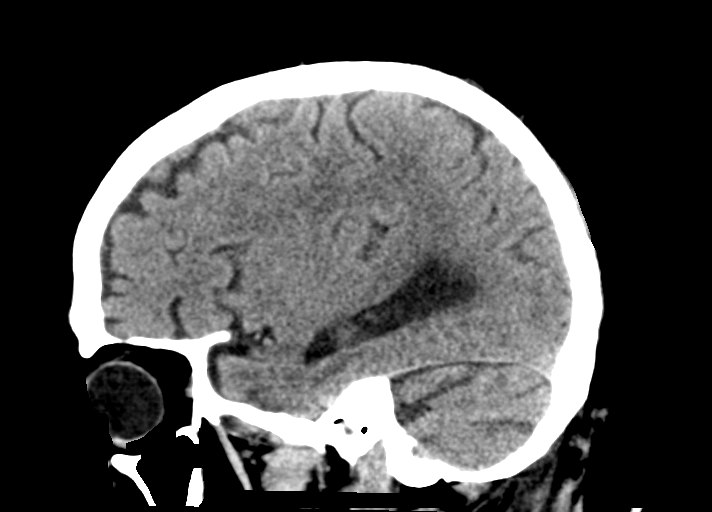
[im 30/59  brain]
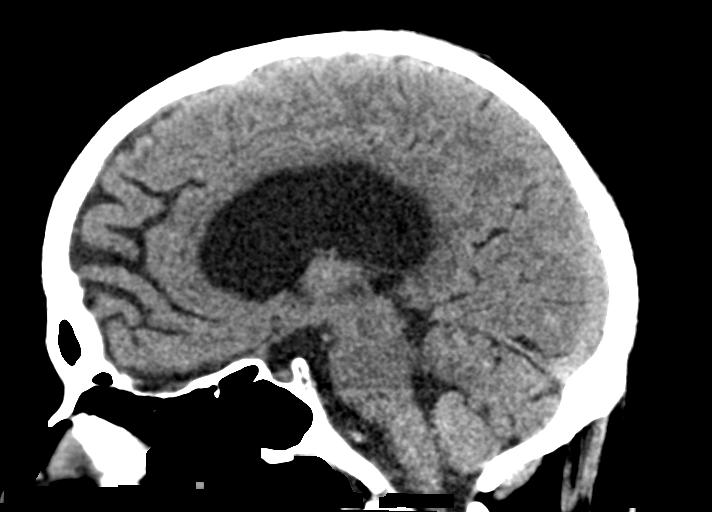
[im 39/59  brain]
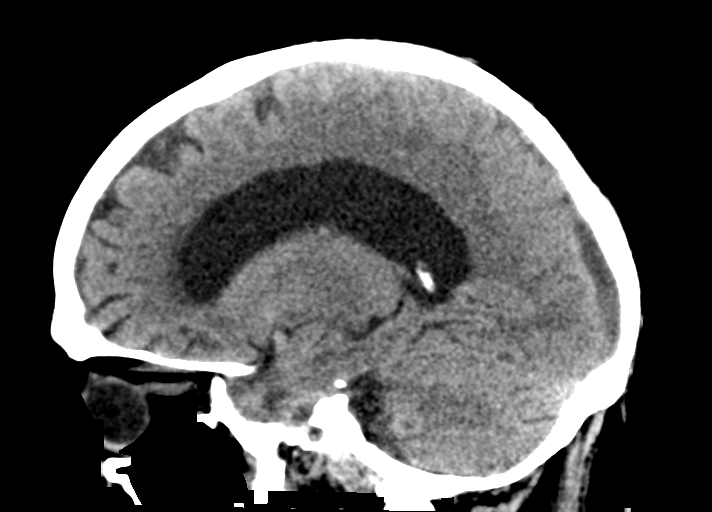

[14 of 47 positions shown; findings below may reference images not displayed]

FINDINGS: Brain: Left subdural collection continues to decrease in size, now
measuring up to 4 mm at the level of the left parietal convexity and
7 mm at the level of the left occipital lobe (previously 6 mm and 8
mm). No evidence for interval bleeding or new/acute blood products.
Minimal flattening and mass effect on the subjacent posterior left
cerebral hemisphere without midline shift, also improving.

Persistent ventriculomegaly with mild dilatation of the temporal
horns of both lateral ventricles, relatively unchanged from
previous, suggesting associated communicating hydrocephalus.

No new intracranial hemorrhage. No acute large vessel territory
infarct. No mass lesion.

Vascular: No hyperdense vessel.

Skull: Scalp soft tissues demonstrate no acute finding. Calvarium
intact.

Sinuses/Orbits: Globes and orbital soft tissues within normal
limits. Paranasal sinuses are clear. No significant mastoid
effusion.

Other: None.
IMPRESSION: 1. Continued interval decrease in size of left subdural collection,
now measuring up to 4 mm in maximal thickness, previously 6 mm. No
evidence for interval bleeding or new/acute blood products.
2. Persistent ventriculomegaly, suggesting associated communicating
hydrocephalus, similar to previous.
3. No other new acute intracranial abnormality.

## 2018-08-21 MED ORDER — AMOXICILLIN-POT CLAVULANATE 875-125 MG PO TABS
1.0000 | ORAL_TABLET | Freq: Two times a day (BID) | ORAL | Status: AC
  Administered 2018-08-21: 1 via ORAL
  Filled 2018-08-21: qty 1

## 2018-08-21 NOTE — Progress Notes (Signed)
Physical Therapy Discharge Summary  Patient Details  Name: [name] Frank MRN: [id number] Date of Birth: [birth year]  Today's Date: 08/21/2018 PT Individual Time: 5329-9242 PT Individual Time Calculation (min): 73 min    Patient has met 8 of 10 long term goals due to improved activity tolerance, improved balance, improved postural control, increased strength, ability to compensate for deficits, improved attention and improved awareness.  Patient to discharge at an ambulatory level [place].   Patient's care partner is independent to provide the necessary physical assistance at discharge.  Reasons goals not met: Patient continues to require min assistance for balance/safety while ambulating with RW.  Recommendation:  Patient will benefit from ongoing skilled PT services in home health setting to continue to advance safe functional mobility, address ongoing impairments in motor planning, standing balance, gait mechanics, bed mobility, transfers, safety awareness, motor processing, muscle timing/activation, and minimize fall risk.  Equipment: RW and wheelchair  Reasons for discharge: treatment goals met and discharge from hospital  Patient/family agrees with progress made and goals achieved: Yes  Skilled Therapeutic Interventions/Progress Updates:  Patient received sitting in w/c in ADL apartment with wife and nursing staff present for medication administration. Pt agreeable to therapy session. Transported to/from gyms in w/c during session. Pt/wife educated on w/c parts (leg rests and breaks) with pt's wife demonstrating understanding throughout session. Pt wearing gait belt for entire session with wife educated on need to use this at home for all standing/ambulatory tasks. Pt/wife educated on proper set-up for car transfer using RW and w/c as well as proper sequencing for increased pt safety. Pt and therapist performed stand pivot car transfer w/c>SUV height car using RW with cuing and  education for sequencing as well as positioning of wife to provide assistance - min assist for balance and AD management. Pt/wife performed same transfer with close supervision from therapist for safety but no hands-on assist and min cuing. Pt/wife educated on proper set-up of w/c and RW to perform stair navigation as well as having chair available at top/bottom of steps for seated rest break if needed. Ascended/descended 4 steps using R handrail to replicate home environment with step-to pattern and pt's wife providing min assist with close supervision from therapist for safety and min cuing for proper pt/wife positioning/sequencing/technique. Ambulated 79f using RW with min assist from wife with therapist providing min cuing throughout for the types of cuing to provide pt for improved gait mechanics to increase safety as well as proper positioning of wife to provide assist. Transported back to room. Stand pivot transfer w/c<>EOB using RW with wife providing CGA and therapist providing min/mod cuing for proper set-up, sequencing, and positioning of wife to provide assist safety. Performed sit>supine with close supervision and min cuing from therapist. Performed supine>sit  providing min assist for trunk upright with cuing for sequencing of logroll technique to increase pt independence. Pt performed sit>supine with supervision from wife and supine>sit with min assist from wife for trunk upright with therapist educating on proper body mechanics of wife for safety - min/mod cuing for pt sequencing of task. Pt left sitting in w/c with wife and [place], present as well as chair alarm on.   PT Discharge Precautions/Restrictions Precautions Precautions: Fall Restrictions Weight Bearing Restrictions: No Vital Signs Therapy Vitals Temp: 97.8 F (36.6 C) Temp Source: Oral Pulse Rate: 62 Resp: 16 BP: 122/63 Patient Position (if appropriate): Lying Oxygen Therapy SpO2: 94 % O2 Device: Room Air Pain Pain  Assessment Pain Scale: 0-10 Pain Score: 0-No  pain Faces Pain Scale: No hurt Pain Intervention(s): Other (Comment)(therapy to tolerance) Vision/Perception  Perception Perception: Impaired Spatial Orientation: continues to lack awareness of proximity to the chair when turning to sit Praxis Praxis: Impaired Praxis Impairment Details: Initiation;Motor planning Praxis-Other Comments: impaired motor planning and initiation when performing functional mobility tasks requiring up to mod cuing for sequencing  Cognition Overall Cognitive Status: Impaired/Different from baseline Arousal/Alertness: Awake/alert Orientation Level: Oriented to place;Oriented to situation;Disoriented to time;Other (comment) Attention: Focused;Sustained Focused Attention: Appears intact Sustained Attention: Impaired(unable to recall what functional mobility task he is performing after initiating it) Selective Attention: Impaired Memory: Impaired Awareness: Impaired(unaware of deficits) Problem Solving: Impaired Sequencing: Impaired Safety/Judgment: Impaired Sensation Sensation Light Touch: Appears Intact Hot/Cold: Not tested Proprioception: Impaired by gross assessment(demonstrates lack of awarness of B LEs during functional mobility task and difficult to differentiate from proprioception) Stereognosis: Not tested Coordination Gross Motor Movements are Fluid and Coordinated: No Coordination and Movement Description: continues to demonstrate impaired motor planning, initiation, and sequencing of movements resulting in impaired coordination Motor  Motor Motor: Other (comment) Motor - Discharge Observations: Shuffling gate with slower movement patterns.  Mobility Bed Mobility Bed Mobility: Supine to Sit;Sit to Supine Rolling Right: Supervision/verbal cueing Rolling Left: Supervision/Verbal cueing Supine to Sit: Minimal Assistance - Patient > 75% Sit to Supine: Supervision/Verbal cueing;Set up  assist Transfers Transfers: Sit to Stand;Stand to Sit Sit to Stand: Contact Guard/Touching assist Stand to Sit: Contact Guard/Touching assist Stand Pivot Transfers: Contact Guard/Touching assist Stand Pivot Transfer Details: Tactile cues for sequencing;Verbal cues for sequencing;Verbal cues for technique;Verbal cues for precautions/safety;Verbal cues for safe use of DME/AE;Tactile cues for placement Transfer (Assistive device): Rolling walker Locomotion  Gait Ambulation: Yes Gait Assistance: Minimal Assistance - Patient > 75% Gait Distance (Feet): 45 Feet Assistive device: Rolling walker Gait Assistance Details: Verbal cues for safe use of DME/AE;Verbal cues for technique;Verbal cues for sequencing;Verbal cues for gait pattern Gait Gait: Yes Gait Pattern: Impaired Gait Pattern: Decreased step length - left;Decreased step length - right;Decreased stance time - right;Decreased stance time - left;Decreased stride length;Poor foot clearance - left;Poor foot clearance - right;Shuffle Gait velocity: decreased Stairs / Additional Locomotion Stairs: Yes Stairs Assistance: Minimal Assistance - Patient > 75% Stair Management Technique: One rail Right Number of Stairs: 4 Height of Stairs: 6 Wheelchair Mobility Wheelchair Mobility: No  Trunk/Postural Assessment  Cervical Assessment Cervical Assessment: Exceptions to WFL(forward head) Thoracic Assessment Thoracic Assessment: Exceptions to WFL(rounded shoulders) Lumbar Assessment Lumbar Assessment: Exceptions to WFL(posterior pelvic tilt) Postural Control Postural Control: Deficits on evaluation Righting Reactions: delayed and inadequate Protective Responses: delayed Postural Limitations: decreased  Balance Balance Balance Assessed: Yes Static Sitting Balance Static Sitting - Balance Support: Feet supported Static Sitting - Level of Assistance: 5: Stand by assistance Dynamic Sitting Balance Dynamic Sitting - Balance Support: Feet  supported Dynamic Sitting - Level of Assistance: 4: Min assist Static Standing Balance Static Standing - Balance Support: During functional activity;Bilateral upper extremity supported Static Standing - Level of Assistance: 4: Min assist Dynamic Standing Balance Dynamic Standing - Balance Support: During functional activity;Bilateral upper extremity supported Dynamic Standing - Level of Assistance: 4: Min assist Extremity Assessment  RLE Assessment RLE Assessment: Within Functional Limits Active Range of Motion (AROM) Comments: Grossly 4+/5 as demonstrated during functional mobility LLE Assessment LLE Assessment: Within Functional Limits General Strength Comments: Grossly 4+/5 as demonstrated during functional mobility    Tawana Scale, PT, DPT 08/21/2018, 7:53 AM

## 2018-08-21 NOTE — Progress Notes (Signed)
Social Work Patient ID: [name] Frank, male   DOB: 12-Aug-1950, 68 y.o.   MRN: [id number] Met with pt and wife who is here for education. It has gone well and wife feels prepared to provide care to pt. Asking to talk with Stacy-RN and pam-PA regarding questions. They both have been contacted and going to the room to see wife and pt. Ready for discharge tomorrow.

## 2018-08-21 NOTE — Progress Notes (Signed)
Social Work  Discharge Note  The overall goal for the admission was met for:   Discharge location: Yes-HOME WITH WIFE WHO CAN PROVIDE 24 HR CARE  Length of Stay: Yes-21 DAYS  Discharge activity level: Yes-SUPERVISION-MIN ASSIST LEVEL  Home/community participation: Yes  Services provided included: MD, RD, PT, OT, SLP, RN, CM, Pharmacy, Neuropsych and SW  Financial Services: Private Insurance: [employer] MEDICARE  Follow-up services arranged: PT [place]. THIS HAS BEEN DONE AND WIFE IS PLEASED WITH. HE WILL GET PT,OT,SP,AIDE AND RN. [employer] HAS PROVIDED WHEELCHAIR-HAS OTHER EQUIPMENT AT HOME FROM PREVIOUS ADMIT. GAVE PRIVATE DUTY LIST AND HANDICAPPED APPLICATION.   Comments (or additional information):WIFE WAS HERE FOR TWO CONSECUTIVE DAYS TO GO THROUGH THERAPIES WITH PT AND LEARN HIS CARE. AWARE HE WILL NEED 24 HR PHYSICAL CARE. WIFE SEEMS TO BE OVERWHELMED AND ENCOURAGED TO HIRE ASSIST AT HOME. SHE DOES NOT WANT TO PURSUE NHP. ENCOURAGED HER TO HIRE ASSIST FOR HER TO HAVE A BREAK OR AT NIGHT TIME FOR HER TO REST. CONCERNED PT WILL TRY TO DO MORE AT HOME AND WILL BE AT HIGH RISK TO FALL AND BE RE-ADMITTED TO [place]. WIFE'S FRIEND-JAMIE HERE TO ASSIST WIFE WITH QUESTIONS AND FOLLOW UP  Patient/Family verbalized understanding of follow-up arrangements: Yes  Individual responsible for coordination of the follow-up plan: [name] Frank-WIFE  Confirmed correct DME delivered: [name] Frank 08/21/2018    [name] Frank

## 2018-08-21 NOTE — Progress Notes (Signed)
[facility] PHYSICAL MEDICINE & REHABILITATION PROGRESS NOTE   Subjective/Complaints:  No further falls, denies any pains today.  Aware of d/c in am   Review of systems:limited due to decrease attn and concentration  Objective:   Ct Head Wo Contrast  Result Date: 08/20/2018 CLINICAL DATA:  Initial evaluation for acute altered mental status, unexplained fall. EXAM: CT HEAD WITHOUT CONTRAST TECHNIQUE: Contiguous axial images were obtained from the base of the skull through the vertex without intravenous contrast. COMPARISON:  Prior CT from 08/04/2018. FINDINGS: Brain: Previously identified left holo hemispheric subdural collection is decreased in size, now measuring up to 6 mm in maximal thickness at the level of the left frontal parietal convexity. Internal more hyperdense components of this collection are also decreased, with the collection now appearing more hypodense as compared to previous. No evidence for interval bleeding. Persistent minimal mass effect on the subjacent left cerebral hemisphere without midline shift. Basilar cisterns remain patent. No acute intracranial hemorrhage. No acute large vessel territory infarct. No mass lesion. Previously seen intraventricular hemorrhage has resolved, with no hemorrhage now seen. Ventricular dilatation relatively stable as compared to 08/04/2018, but increased relative to 07/17/2018. No significant periventricular hypodensity to suggest transependymal flow of CSF. Vascular: No hyperdense vessel. Scattered vascular calcifications noted within the carotid siphons. Skull: Scalp soft tissues within normal limits. Calvarium intact. Sinuses/Orbits: Globes and orbital soft tissues within normal limits. Visualized paranasal sinuses are largely clear. No mastoid effusion. Other: None. IMPRESSION: 1. Interval decrease in size of left holo hemispheric subdural collection, now measuring up to 6 mm in maximal thickness. No evidence for interval bleeding or other acute  finding. Persistent but improved mass effect on the subjacent left cerebral hemisphere without midline shift. 2. Interval resolution of previously seen intraventricular hemorrhage. Ventriculomegaly is relatively stable as compared to 08/04/2018, but increased relative to 07/17/2018. No findings to suggest transependymal flow of CSF. 3. No other new acute intracranial process. Electronically Signed   By: Rise MuBenjamin  McClintock M.D.   On: 08/20/2018 20:45   No results for input(s): WBC, HGB, HCT, PLT in the last 72 hours. No results for input(s): NA, K, CL, CO2, GLUCOSE, BUN, CREATININE, CALCIUM in the last 72 hours.  Intake/Output Summary (Last 24 hours) at 08/21/2018 0823 Last data filed at 08/21/2018 [id number] Gross per 24 hour  Intake 822 ml  Output 528 ml  Net 294 ml     Physical Exam: Vital Signs Blood pressure 122/63, pulse 62, temperature 97.8 F (36.6 C), temperature source Oral, resp. rate 16, weight 92.9 kg, SpO2 94 %. Constitutional: No distress . Vital signs reviewed. HENT: Normocephalic.  Atraumatic. Eyes: EOMI.  No discharge. Cardiovascular: No JVD. Respiratory: Normal effort. GI: Non-distended. Musc: No edema or tenderness in extremities.no shoulder pain with ROM  Neurologic: Alert Motor: 4/5 throughout Patient: Appears to have normal mood and affect. Oriented x 3  Ext No C/C/E  Assessment/Plan: 1. Functional deficits secondary to left subdural hematoma, subarachnoid hemorrhage following a fall at home which require 3+ hours per day of interdisciplinary therapy in a comprehensive inpatient rehab setting.  Physiatrist is providing close team supervision and 24 hour management of active medical problems listed below.  Physiatrist and rehab team continue to assess barriers to discharge/monitor patient progress toward functional and medical goals  Care Tool:  Bathing  Bathing activity did not occur: Refused Body parts bathed by patient: Right arm, Left arm, Chest, Abdomen,  Front perineal area, Buttocks, Right upper leg, Left upper leg, Face, Right lower leg,  Left lower leg   Body parts bathed by helper: Right lower leg, Left lower leg Body parts n/a: Right upper leg, Left upper leg, Right lower leg, Left lower leg   Bathing assist Assist Level: Minimal Assistance - Patient > 75%     Upper Body Dressing/Undressing Upper body dressing   What is the patient wearing?: Button up shirt    Upper body assist Assist Level: Minimal Assistance - Patient > 75%    Lower Body Dressing/Undressing Lower body dressing      What is the patient wearing?: Incontinence brief, Pants     Lower body assist Assist for lower body dressing: Minimal Assistance - Patient > 75%     Toileting Toileting    Toileting assist Assist for toileting: Maximal Assistance - Patient 25 - 49%     Transfers Chair/bed transfer  Transfers assist     Chair/bed transfer assist level: Minimal Assistance - Patient > 75%     Locomotion Ambulation   Ambulation assist      Assist level: Minimal Assistance - Patient > 75% Assistive device: Walker-rolling Max distance: 60 ft   Walk 10 feet activity   Assist     Assist level: Minimal Assistance - Patient > 75% Assistive device: Walker-rolling   Walk 50 feet activity   Assist Walk 50 feet with 2 turns activity did not occur: Safety/medical concerns  Assist level: Minimal Assistance - Patient > 75% Assistive device: Walker-rolling    Walk 150 feet activity   Assist Walk 150 feet activity did not occur: Safety/medical concerns         Walk 10 feet on uneven surface  activity   Assist Walk 10 feet on uneven surfaces activity did not occur: Safety/medical concerns         Wheelchair     Assist Will patient use wheelchair at discharge?: No Type of Wheelchair: Manual Wheelchair activity did not occur: N/A  Wheelchair assist level: Minimal Assistance - Patient > 75% Max wheelchair distance: 40'     Wheelchair 50 feet with 2 turns activity    Assist    Wheelchair 50 feet with 2 turns activity did not occur: N/A       Wheelchair 150 feet activity     Assist Wheelchair 150 feet activity did not occur: N/A          Medical Problem List and Plan: 1.Functional declinesecondary toAMS, 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 Exhibits signs of aphasia,cont ritalin  10mg   CIR PT, OT, SLP plan d/c in am  . 2. Antithrombotics: -DVT/anticoagulation:Mechanical:Sequential compression devices, below kneeBilateral lower extremities -antiplatelet therapy: N/A 3. Pain Management:Tylenol prn. 4. Mood:LCSW to follow for evaluation and support. -antipsychotic agents: N/A 5. Neuropsych: This patientis notcapable of making decisions on hisown behalf. He has the capacity to designate a POA  6. Skin/Wound Care:routine pressure relief measures. 7. Fluids/Electrolytes/Nutrition:Monitor I/Os. 8. T2DM: Monitor BS ac/hs. SSI for elevated BS.Adjust regimen as needed.  Glucotrol 2.5 added on 5/30 Increase to 5mg  BID on 6/8  Remains labile and elevated on 6/8 CBG (last 3)  Recent Labs    08/20/18 1647 08/20/18 2101 08/21/18 0625  GLUCAP 178* 212* 150*  CBGs < 200 , improving, but still elevated ,increased Glucotrol to 10mg  BID 6/10 9. CAD/PAF/A flutter: Off Plavix due to SDH. Monitor HR bid. Continue Cardizem CD and Toprol XL. Will order orthostatic vitals due to reports of dizziness. Ordered TEDs. Continue to hold Losartan.Pt with  regular rhythm at present Vitals:   08/20/18 2035 08/21/18 0632  BP: (!) 114/59 122/63  Pulse: (!) 51 62  Resp: 16 16  Temp: 98.7 F (37.1 C) 97.8 F (36.6 C)  SpO2: 100% 94%   Reduced cardizem to 180mg  per day on 6/8 due to bradycardia and soft BP, goal is HR>50bpm, BP in range still with brady, cont to monitor improving   10. Left > Right  hydrocele:11. AKI with hyponateremia: Resolved   12. New onset seizures: Continue Keppra 750 mg bid.Overly sedated on 1000mg    Keppra has been increased due to suspected seizure activity but no observe seizures   13.  Urinary retention: Continue Flomax monitor for orthostatic hypotension  LOS: 20 days A FACE TO FACE EVALUATION WAS PERFORMED  Erick Colacendrew E Kadija Cruzen 08/21/2018, 8:23 AM

## 2018-08-21 NOTE — Progress Notes (Deleted)
Occupational Therapy Session Note  Patient Details  Name: [name] Frank MRN: [id number] Date of Birth: Oct 10, 1950  Today's Date: 08/21/2018 OT Individual Time: 4825-0037 OT Individual Time Calculation (min): 28 min    Short Term Goals: Week 1:  OT Short Term Goal 1 (Week 1): Continue to assess vision  OT Short Term Goal 1 - Progress (Week 1): Met OT Short Term Goal 2 (Week 1): Pt will pick out clothing with mod questining cues  OT Short Term Goal 2 - Progress (Week 1): Not met OT Short Term Goal 3 (Week 1): Pt will perform bed mobility to come to EOB in prep for ADL tasks with contact guard OT Short Term Goal 3 - Progress (Week 1): Not met OT Short Term Goal 4 (Week 1): Pt will perform toileting tasks with mod A sit to stands OT Short Term Goal 4 - Progress (Week 1): Met OT Short Term Goal 5 (Week 1): Pt will perform 2 grooming tasks in standing to improve standing endurance OT Short Term Goal 5 - Progress (Week 1): Not met  Skilled Therapeutic Interventions/Progress Updates:      Therapy Documentation Precautions:  Precautions Precautions: Fall Restrictions Weight Bearing Restrictions: No General:   Vital Signs: Therapy Vitals Temp: 98.2 F (36.8 C) Temp Source: Oral Pulse Rate: (!) 59 Resp: 16 BP: 108/61 Patient Position (if appropriate): Lying Oxygen Therapy SpO2: 99 % O2 Device: Room Air Pain: Pain Assessment Pain Scale: Faces Faces Pain Scale: No hurt ADL: ADL Eating: Set up Where Assessed-Eating: Chair Grooming: Moderate assistance Where Assessed-Grooming: Sitting at sink Upper Body Bathing: Minimal assistance Where Assessed-Upper Body Bathing: Shower Lower Body Bathing: Moderate assistance Where Assessed-Lower Body Bathing: Shower Upper Body Dressing: Moderate assistance Where Assessed-Upper Body Dressing: Sitting at sink Lower Body Dressing: Maximal assistance Where Assessed-Lower Body Dressing: Sitting at sink Toileting: Maximal  assistance Toilet Transfer: Minimal assistance Toilet Transfer Method: Insurance claims handler Equipment: Energy manager: Environmental education officer Method: Heritage manager: Civil engineer, contracting with back, Grab bars Vision Baseline Vision/History: Wears glasses Wears Glasses: Reading only Patient Visual Report: No change from baseline Vision Assessment?: Yes Eye Alignment: Impaired (comment)(left eye sits inferior to the right eye with left eye lid drooping slightly as well) Ocular Range of Motion: Within Functional Limits Tracking/Visual Pursuits: Able to track stimulus in all quads without difficulty Saccades: Within functional limits Convergence: Within functional limits Visual Fields: No apparent deficits Perception  Perception: Within Functional Limits Praxis Praxis Impairment Details: Motor planning Praxis-Other Comments: Pt with motor impairment noted during mobility with shuffling gait and short step length. Exercises:   Other Treatments:     Therapy/Group: Individual Therapy  [name] Frank 08/21/2018, 4:01 PM

## 2018-08-21 NOTE — Progress Notes (Signed)
Occupational Therapy Session Note  Patient Details  Name: [name] Frank MRN: [id number] Date of Birth: 05-29-1950  Today's Date: 08/21/2018 OT Individual Time: 0900-1004 OT Individual Time Calculation (min): 64 min    Short Term Goals: Week 3:  OT Short Term Goal 1 (Week 3): Continue working on established goals set at supervision level overall.   Skilled Therapeutic Interventions/Progress Updates:    Pt completed supine to sit EOB with min assist to start session.  HOB elevated up to 25 degrees based on pt's report that he has a bed similar to this one at home minus the bedrails.  He then completed stand pivot transfer with min assist from the bed to the wheelchair.  Had pt work on buttoning up his shirt and tucking it in to start session, which he could complete with supervision.  He then worked on donning his suspenders which required mod assist for orientation.  He was able to donn his socks and shoes this session with setup, using a step stool.  Pt's spouse in for remainder of session with pt being taken down to the ADL apartment where they practiced walk-in shower transfers with overall min assist.  Pt was able to step backwards into simulated shower and sit on the seat with mod instructional cueing and min assist from spouse.  Encouraged her to use the gait belt with all transfers.  Also educated both on need for removal of throw rugs in the bathrooms and the house as well as need for hand held shower.  She voiced understanding.  Pt was left in the apartment with nursing to administer meds and for next session with PT.  All questions answered by therapist from spouse and pt.  He was not oriented to day of the week this session, but was able to state the month.    Therapy Documentation Precautions:  Precautions Precautions: Fall Restrictions Weight Bearing Restrictions: No   Pain:  No pain  ADL: ADL Eating: Set up Where Assessed-Eating: Chair Grooming: Moderate assistance Where  Assessed-Grooming: Sitting at sink Upper Body Bathing: Minimal assistance Where Assessed-Upper Body Bathing: Shower Lower Body Bathing: Moderate assistance Where Assessed-Lower Body Bathing: Shower Upper Body Dressing: Moderate assistance Where Assessed-Upper Body Dressing: Sitting at sink Lower Body Dressing: Maximal assistance Where Assessed-Lower Body Dressing: Sitting at sink Toileting: Maximal assistance Toilet Transfer: Minimal assistance Toilet Transfer Method: Insurance claims handler Equipment: Emergency planning/management officer Transfer: Minimal assistance Social research officer, government Method: Heritage manager: Shower seat with back, Grab bars  Therapy/Group: Individual Therapy  [name]  OTR/L 08/21/2018, 11:40 AM

## 2018-08-21 NOTE — Progress Notes (Signed)
Speech Language Pathology Discharge Summary  Patient Details  Name: [name] Frank MRN: [id number] Date of Birth: 07/09/1950  Today's Date: 08/21/2018 SLP Individual Time: 1300-1400 SLP Individual Time Calculation (min): 60 min   Skilled Therapeutic Interventions:  Skilled treatment session focused on cognition goals. SLP attempted to administered a formal standardized cognitive assessment but pt was not able to complete any of the subests presented. Initially, SLP presented the Cognitive-Linguistic Quick Test. Pt unable to complete any of the task because of various reasons and despite all reasons being explained to pt, he was not able to accurately complete or engage in task. He frequently asked to do another task. Pt present with significant impairments that are concerning within home environment as pt doesn't demonstrate any awareness or insight and very little mental flexibility.   Patient has met 8 of 8 long term goals.  Patient to discharge at overall Min;Mod level.    Clinical Impression/Discharge Summary:   Pt has made progress in skilled ST sessions and as a result he has met 8 of 8 LTGs at a Mod A to Min A level. He continues to lack insight into cognitive deficits and all education has been completed with pt's wife. Recommend active 24 hour supervision with HHST to target basic problem solving, selective attention, task toleration, task initiation and awareness of safety risks.    Care Partner:  Caregiver Able to Provide Assistance: Yes  Type of Caregiver Assistance: Physical;Cognitive  Recommendation:  24 hour supervision/assistance;Home Health SLP  Rationale for SLP Follow Up: Maximize functional communication;Reduce caregiver burden;Maximize cognitive function and independence   Equipment:   N/A  Reasons for discharge: Discharged from hospital   Patient/Family Agrees with Progress Made and Goals Achieved: Yes      08/21/2018, 3:06 PM    

## 2018-08-21 NOTE — Progress Notes (Signed)
Occupational Therapy Discharge Summary  Patient Details  Name: [name] Frank MRN: [id number] Date of Birth: May 04, 1950  Today's Date: 08/21/2018 OT Individual Time: 5790-3833 OT Individual Time Calculation (min): 28 min   Session Note:  Pt completed supine to sit EOB with min assist.  Increased posterior lean and LOB X 1 when therapist was attempting MMT at EOB.  He was able to complete sit to stand with min assist and functional mobility out to the dayroom with min assist and mod instructional cueing for upright posture and bigger step length.  He demonstrates shuffling gait most of the time, but improves for short periods when cued.  He was not oriented to month or day of the week when asked, while resting post mobility.  Returned to room at end of session with min assist with transfer back to the bed and for transition to supine from sitting.  Call button and phone in reach with safety bed alarm in place.    Patient has met 10 of 12 long term goals due to improved activity tolerance, improved balance, postural control, ability to compensate for deficits, improved attention, improved awareness and improved coordination.  Patient to discharge at [facility] Assist level.  Patient's care partner is independent to provide the necessary physical and cognitive assistance at discharge.    Reasons goals not met: Pt needs min assist for sit to stand as well as max assist for selective attention.    Recommendation:  Patient will benefit from ongoing skilled OT services in home health setting to continue to advance functional skills in the area of BADL, iADL and Reduce care partner burden.  Pt continues to need mod to max instructional cueing for sequencing selfcare tasks as well as min assist for functional transfers at this time.  He demonstrates limited sustained attention as well as decreased awareness.  Feel he will need extensive HHOT to progress to supervision/modified independent level for home and 24  hour min assist for safety.   Equipment: No equipment provided  Reasons for discharge: treatment goals met and discharge from hospital  Patient/family agrees with progress made and goals achieved: Yes  OT Discharge Precautions/Restrictions  Precautions Precautions: Fall Restrictions Weight Bearing Restrictions: No  Pain Pain Assessment Pain Scale: Faces Faces Pain Scale: No hurt ADL ADL Eating: Set up Where Assessed-Eating: Chair Grooming: Moderate assistance Where Assessed-Grooming: Sitting at sink Upper Body Bathing: Minimal assistance Where Assessed-Upper Body Bathing: Shower Lower Body Bathing: Moderate assistance Where Assessed-Lower Body Bathing: Shower Upper Body Dressing: Moderate assistance Where Assessed-Upper Body Dressing: Sitting at sink Lower Body Dressing: Maximal assistance Where Assessed-Lower Body Dressing: Sitting at sink Toileting: Maximal assistance Toilet Transfer: Minimal assistance Toilet Transfer Method: Insurance claims handler Equipment: Energy manager: Environmental education officer Method: Heritage manager: Civil engineer, contracting with back, Grab bars Vision Baseline Vision/History: Wears glasses Wears Glasses: Reading only Patient Visual Report: No change from baseline Vision Assessment?: Yes Eye Alignment: Impaired (comment)(left eye sits inferior to the right eye with left eye lid drooping slightly as well) Ocular Range of Motion: Within Functional Limits Tracking/Visual Pursuits: Able to track stimulus in all quads without difficulty Saccades: Within functional limits Convergence: Within functional limits Visual Fields: No apparent deficits Perception  Perception: Within Functional Limits Praxis Praxis Impairment Details: Motor planning Praxis-Other Comments: Pt with motor impairment noted during mobility with shuffling gait and short step length. Cognition Overall Cognitive Status:  Impaired/Different from baseline Arousal/Alertness: Awake/alert Orientation Level: Oriented to place;Oriented to  situation;Disoriented to time;Other (comment)(Pt stated month was July) Attention: Selective Focused Attention: Appears intact Sustained Attention: Impaired Sustained Attention Impairment: Verbal basic;Functional basic Selective Attention: Impaired Selective Attention Impairment: Functional basic;Verbal basic Memory: Impaired Memory Impairment: Retrieval deficit;Storage deficit;Decreased short term memory Decreased Short Term Memory: Verbal basic Awareness: Impaired Awareness Impairment: Emergent impairment;Anticipatory impairment;Intellectual impairment Problem Solving: Impaired Problem Solving Impairment: Verbal basic Executive Function: (all impaired by lower level deficits) Sequencing: Impaired Sequencing Impairment: Functional basic Organizing: Impaired Organizing Impairment: Functional basic Behaviors: Impulsive Safety/Judgment: Impaired Comments: Pt with attempts to ambulate to the bathroom and get OOB without anticipatory awareness of balance issues. [employer] Scales of Cognitive Functioning: Automatic/appropriate Sensation Sensation Light Touch: Appears Intact Hot/Cold: Appears Intact Proprioception: Appears Intact Stereognosis: Appears Intact Coordination Gross Motor Movements are Fluid and Coordinated: No(Slower movements bilaterally with functional tasks using BUEs) Fine Motor Movements are Fluid and Coordinated: Yes Motor  Motor Motor: Abnormal postural alignment and control Motor - Discharge Observations: Shuffling gate with slower movement patterns. Mobility  Bed Mobility Bed Mobility: Rolling Right;Rolling Left;Supine to Sit;Sit to Supine Rolling Right: Supervision/verbal cueing Rolling Left: Supervision/Verbal cueing Supine to Sit: Minimal Assistance - Patient > 75% Sit to Supine: Minimal Assistance - Patient > 75% Transfers Sit to  Stand: Minimal Assistance - Patient > 75% Stand to Sit: Minimal Assistance - Patient > 75%  Trunk/Postural Assessment  Cervical Assessment Cervical Assessment: Exceptions to WFL(cervical flexion with lateral tilt to the left) Thoracic Assessment Thoracic Assessment: Exceptions to WFL(thoracic kyphosis) Lumbar Assessment Lumbar Assessment: Exceptions to WFL(posterior pelvic tilt in sitting) Postural Control Postural Control: Deficits on evaluation(increased posterior lean with knees flexed in standing)  Balance Static Sitting Balance Static Sitting - Balance Support: Feet supported Static Sitting - Level of Assistance: 5: Stand by assistance Dynamic Sitting Balance Dynamic Sitting - Balance Support: During functional activity Dynamic Sitting - Level of Assistance: 4: Min assist Static Standing Balance Static Standing - Balance Support: During functional activity;Bilateral upper extremity supported Static Standing - Level of Assistance: 4: Min assist Dynamic Standing Balance Dynamic Standing - Balance Support: During functional activity Dynamic Standing - Level of Assistance: 4: Min assist Extremity/Trunk Assessment RUE Assessment RUE Assessment: Within Functional Limits General Strength Comments: strength 4/5 throughout LUE Assessment LUE Assessment: Within Functional Limits General Strength Comments: strength 5/5 throughout   [place] OTR/L 08/21/2018, 4:06 PM

## 2018-08-21 NOTE — Progress Notes (Signed)
   08/21/18 1410  What Happened  Was fall witnessed? No  Was patient injured? No  Patient found on floor;in bathroom  Found by Staff-comment (NT)  Stated prior activity other (comment) (seated on toilet reaching for rolling walker)  Follow Up  MD notified Algis Liming, PA  Time MD notified [phone]  Family notified Yes-comment  Time family notified 1430  Additional tests Yes-comment (CT ordered by Algis Liming, PA)  Simple treatment Ice  Progress note created (see row info) Yes  Adult Fall Risk Assessment  Risk Factor Category (scoring not indicated) Fall has occurred during this admission (document High fall risk);History of more than one fall within 6 months before admission (document High fall risk)  Age 68  Fall History: Fall within 6 months prior to admission 5  Elimination; Bowel and/or Urine Incontinence 2  Elimination; Bowel and/or Urine Urgency/Frequency 2  Medications: includes PCA/Opiates, Anti-convulsants, Anti-hypertensives, Diuretics, Hypnotics, Laxatives, Sedatives, and Psychotropics 5  Patient Care Equipment 0  Mobility-Assistance 2  Mobility-Gait 2  Mobility-Sensory Deficit 0  Altered awareness of immediate physical environment 1  Impulsiveness 2  Lack of understanding of one's physical/cognitive limitations 4  Total Score 26  Patient Fall Risk Level High fall risk  Adult Fall Risk Interventions  Required Bundle Interventions *See Row Information* High fall risk - low, moderate, and high requirements implemented  Additional Interventions Camera surveillance (with patient/family notification & education);Room near nurses station;Assess orthostatic BP  Screening for Fall Injury Risk (To be completed on HIGH fall risk patients) - Assessing Need for Low Bed  Risk For Fall Injury- Low Bed Criteria Previous fall this admission;Admitted as a result of a fall;TeleSitter Camera in use  Will Implement Low Bed and Floor Mats Low bed contraindicated, floor mats in place (patient  preparing for discharge 08/22/2018)  Specialty Low Bed Contraindicated  (patient preparing for discharge 08/22/2018)  Screening for Fall Injury Risk (To be completed on HIGH fall risk patients who do not meet crieteria for Low Bed) - Assessing Need for Floor Mats Only  Risk For Fall Injury- Criteria for Floor Mats Confusion/dementia (+NuDESC, CIWA, TBI, etc.)  Will Implement Floor Mats Yes  Vitals  Temp 98.2 F (36.8 C)  Temp Source Oral  BP (!) 129/58  BP Location Left Arm  BP Method Automatic  Patient Position (if appropriate) Lying  Pulse Rate 64  Pulse Rate Source Dinamap  Resp 16  Oxygen Therapy  SpO2 98 %  O2 Device Room Air  Pain Assessment  Pain Scale 0-10  Pain Score 0  Neurological  Neuro (WDL) X  Level of Consciousness Alert  Orientation Level Oriented X4  Cognition Follows commands;Poor attention/concentration  Speech Clear  R Hand Grip Moderate  L Hand Grip Moderate   R Foot Dorsiflexion Weak  L Foot Dorsiflexion Weak  R Foot Plantar Flexion Moderate  L Foot Plantar Flexion Moderate  RUE Motor Response Purposeful movement  RUE Sensation Full sensation  RUE Motor Strength 4  LUE Motor Response Purposeful movement  LUE Sensation Full sensation  LUE Motor Strength 4  RLE Motor Response Purposeful movement  RLE Sensation Full sensation  RLE Motor Strength 3  LLE Motor Response Purposeful movement  LLE Sensation Full sensation  LLE Motor Strength 3  Neuro Symptoms Fatigue  Neuro symptoms relieved by Rest

## 2018-08-22 DIAGNOSIS — E119 Type 2 diabetes mellitus without complications: Secondary | ICD-10-CM

## 2018-08-22 DIAGNOSIS — D708 Other neutropenia: Secondary | ICD-10-CM

## 2018-08-22 LAB — CBC
HCT: 40 % (ref 39.0–52.0)
Hemoglobin: 13.2 g/dL (ref 13.0–17.0)
MCH: 29.5 pg (ref 26.0–34.0)
MCHC: 33 g/dL (ref 30.0–36.0)
MCV: 89.5 fL (ref 80.0–100.0)
Platelets: 151 10*3/uL (ref 150–400)
RBC: 4.47 MIL/uL (ref 4.22–5.81)
RDW: 14.1 % (ref 11.5–15.5)
WBC: 3.7 10*3/uL — ABNORMAL LOW (ref 4.0–10.5)
nRBC: 0 % (ref 0.0–0.2)

## 2018-08-22 LAB — BASIC METABOLIC PANEL
Anion gap: 8 (ref 5–15)
BUN: 15 mg/dL (ref 8–23)
CO2: 28 mmol/L (ref 22–32)
Calcium: 8.9 mg/dL (ref 8.9–10.3)
Chloride: 102 mmol/L (ref 98–111)
Creatinine, Ser: 0.84 mg/dL (ref 0.61–1.24)
GFR calc Af Amer: >60 mL/min (ref >60)
GFR calc non Af Amer: >60 mL/min (ref >60)
Glucose, Bld: 186 mg/dL — ABNORMAL HIGH (ref 70–99)
Potassium: 3.6 mmol/L (ref 3.5–5.1)
Sodium: 138 mmol/L (ref 135–145)

## 2018-08-22 LAB — GLUCOSE, CAPILLARY
Glucose-Capillary: 172 mg/dL — ABNORMAL HIGH (ref 70–99)
Glucose-Capillary: 180 mg/dL — ABNORMAL HIGH (ref 70–99)

## 2018-08-22 MED ORDER — DILTIAZEM HCL ER COATED BEADS 180 MG PO CP24: 180.0000 mg | ORAL_CAPSULE | Freq: Every day | ORAL | 1 refills | Status: DC

## 2018-08-22 MED ORDER — ATORVASTATIN CALCIUM 80 MG PO TABS: 80.0000 mg | ORAL_TABLET | Freq: Every day | ORAL | 1 refills | Status: DC

## 2018-08-22 MED ORDER — TAMSULOSIN HCL 0.4 MG PO CAPS: 0.4000 mg | ORAL_CAPSULE | Freq: Every day | ORAL | 1 refills | Status: DC

## 2018-08-22 MED ORDER — METHYLPHENIDATE HCL 10 MG PO TABS: 10.0000 mg | ORAL_TABLET | Freq: Two times a day (BID) | ORAL | 0 refills | Status: DC

## 2018-08-22 MED ORDER — GLIPIZIDE ER 10 MG PO TB24: 10.0000 mg | ORAL_TABLET | Freq: Two times a day (BID) | ORAL | 1 refills | Status: DC

## 2018-08-22 MED ORDER — METOPROLOL SUCCINATE ER 100 MG PO TB24: 100.0000 mg | ORAL_TABLET | Freq: Every day | ORAL | 0 refills | Status: DC

## 2018-08-22 MED ORDER — LEVETIRACETAM 750 MG PO TABS: 750.0000 mg | ORAL_TABLET | Freq: Two times a day (BID) | ORAL | 1 refills | Status: DC

## 2018-08-22 MED ORDER — DOCUSATE SODIUM 100 MG PO CAPS: 100.0000 mg | ORAL_CAPSULE | Freq: Two times a day (BID) | ORAL | 0 refills | Status: DC

## 2018-08-22 NOTE — Progress Notes (Signed)
Social Work Patient ID: [name] Frank, male   DOB: 1950-12-14, 68 y.o.   MRN: [id number]  Spoke with wife via telephone to answer questions and wife concerned she may not be able to care for him at home. Discussed the options of hiring assist and or NHP. Wife wants to try it at home and may hire assist for a few hours per day. She states: " yesterday was a blur." Answered her questions and Pam-PA to go over DC instructions prior to DC home.

## 2018-08-22 NOTE — Plan of Care (Signed)
  Problem: Consults Goal: RH BRAIN INJURY PATIENT EDUCATION Description: Description: See Patient Education module for eduction specifics Outcome: Progressing Goal: Skin Care Protocol Initiated - if Braden Score 18 or less Description: If consults are not indicated, leave blank or document N/A Outcome: Progressing Goal: Nutrition Consult-if indicated Outcome: Progressing Goal: Diabetes Guidelines if Diabetic/Glucose > 140 Description: If diabetic or lab glucose is > 140 mg/dl - Initiate Diabetes/Hyperglycemia Guidelines & Document Interventions  Outcome: Progressing   Problem: RH BOWEL ELIMINATION Goal: RH STG MANAGE BOWEL WITH ASSISTANCE Description: STG Manage Bowel with Assistance. Outcome: Progressing Goal: RH STG MANAGE BOWEL W/MEDICATION W/ASSISTANCE Description: STG Manage Bowel with Medication with Assistance. Outcome: Progressing   Problem: RH BLADDER ELIMINATION Goal: RH STG MANAGE BLADDER WITH ASSISTANCE Description: STG Manage Bladder With Assistance Outcome: Progressing Goal: RH STG MANAGE BLADDER WITH MEDICATION WITH ASSISTANCE Description: STG Manage Bladder With Medication With Assistance. Outcome: Progressing Goal: RH STG MANAGE BLADDER WITH EQUIPMENT WITH ASSISTANCE Description: STG Manage Bladder With Equipment With Assistance Outcome: Progressing   Problem: RH SKIN INTEGRITY Goal: RH STG SKIN FREE OF INFECTION/BREAKDOWN Outcome: Progressing Goal: RH STG MAINTAIN SKIN INTEGRITY WITH ASSISTANCE Description: STG Maintain Skin Integrity With Assistance. Outcome: Progressing   Problem: RH SAFETY Goal: RH STG ADHERE TO SAFETY PRECAUTIONS W/ASSISTANCE/DEVICE Description: STG Adhere to Safety Precautions With Assistance/Device. Outcome: Progressing Goal: RH STG DECREASED RISK OF FALL WITH ASSISTANCE Description: STG Decreased Risk of Fall With Assistance. Outcome: Progressing   Problem: RH COGNITION-NURSING Goal: RH STG USES MEMORY AIDS/STRATEGIES W/ASSIST  TO PROBLEM SOLVE Description: STG Uses Memory Aids/Strategies With Assistance to Problem Solve. Outcome: Progressing Goal: RH STG ANTICIPATES NEEDS/CALLS FOR ASSIST W/ASSIST/CUES Description: STG Anticipates Needs/Calls for Assist With Assistance/Cues. Outcome: Progressing   Problem: RH PAIN MANAGEMENT Goal: RH STG PAIN MANAGED AT OR BELOW PT'S PAIN GOAL Outcome: Progressing   Problem: RH KNOWLEDGE DEFICIT BRAIN INJURY Goal: RH STG INCREASE KNOWLEDGE OF SELF CARE AFTER BRAIN INJURY Outcome: Progressing Goal: RH STG INCREASE KNOWLEDGE OF DYSPHAGIA/FLUID INTAKE Outcome: Progressing

## 2018-08-22 NOTE — Discharge Summary (Signed)
Physician Discharge Summary  Patient ID: [name] Frank MRN: [id number] DOB/AGE: [birth year] 68 y.o.  Admit date: 08/01/2018 Discharge date: 08/22/2018  Discharge Diagnoses:  Principal Problem:   SDH (subdural hematoma) (HCC) Active Problems:   Hypertension, essential   Paroxysmal atrial fibrillation (HCC)   Leukopenia   Hypoalbuminemia due to protein-calorie malnutrition (HCC)   Hydrocele   Diabetes mellitus type 2 in obese (HCC)   Bradycardia   Discharged Condition:  Stable   Significant Diagnostic Studies: Ct Angio Head W Or Wo Contrast  Result Date: 07/28/2018 CLINICAL DATA:  68 y/o M; left-sided facial droop. Progressive somnolence. EXAM: CT ANGIOGRAPHY HEAD AND NECK TECHNIQUE: Multidetector CT imaging of the head and neck was performed using the standard protocol during bolus administration of intravenous contrast. Multiplanar CT image reconstructions and MIPs were obtained to evaluate the vascular anatomy. Carotid stenosis measurements (when applicable) are obtained utilizing NASCET criteria, using the distal internal carotid diameter as the denominator. CONTRAST:  75mL OMNIPAQUE IOHEXOL 350 MG/ML SOLN COMPARISON:  07/28/2018 CT of the head. FINDINGS: CTA NECK FINDINGS Aortic arch: Standard branching. Imaged portion shows no evidence of aneurysm or dissection. No significant stenosis of the major arch vessel origins. Mild aortic calcific atherosclerosis. Right carotid system: No evidence of dissection, stenosis (50% or greater) or occlusion. Mild non stenotic calcific atherosclerosis of carotid bifurcation. Left carotid system: No evidence of dissection, stenosis (50% or greater) or occlusion. Mild non stenotic calcific atherosclerosis of carotid bifurcation. Vertebral arteries: Left V1-V2 proximal V3 are occluded. The left distal V3 and V4 are diffusely irregular with attenuated enhancement and reversal of flow from the vertebrobasilar junction. There appears to be a cauda above the left  C2 foramen transversarium (series 7, image 86). Widely patent and dominant right vertebral artery. Skeleton: No acute finding. Other neck: Negative. Upper chest: Right lung apex calcified granuloma. Review of the MIP images confirms the above findings CTA HEAD FINDINGS Anterior circulation: No significant stenosis, proximal occlusion, aneurysm, or vascular malformation. Posterior circulation: Left V1-V2 and proximal V3 are occluded, no visible dissection flap or wall hematoma. The left distal V3 and V4 are diffusely irregular with attenuated enhancement and reversal of flow from the vertebrobasilar junction. Widely patent and dominant right vertebral artery. There appears to be a cauda above the left C2 foramen transversarium (series 7, image 86). Tandem segments of mild-to-moderate stenosis in the left P2 and P3 segments. Large PICA and SCA noted bilaterally. Otherwise no significant stenosis, proximal occlusion, aneurysm, or vascular malformation in the posterior circulation. Venous sinuses: As permitted by contrast timing, patent. Anatomic variants: Fetal left PCA. No right posterior communicating artery identified, likely hypoplastic or absent. Probable diminutive anterior communicating artery. Review of the MIP images confirms the above findings IMPRESSION: 1. Nonspecific left V1-V2 and proximal V3 occlusion. There appears to be a cutoff above the left C2 foramen transversarium suggesting a recent thromboembolic occlusion or dissection. 2. Left distal V3 and V4 are diffusely irregular with attenuated enhancement and reversal of flow from vertebrobasilar junction. 3. Patent carotid systems and dominant right vertebral artery without dissection, aneurysm, or hemodynamically significant stenosis utilizing NASCET criteria. 4. Mild calcific atherosclerosis of aortic arch and carotid bifurcations. CTA HEAD:. 1. Patent Circle of Willis. No intracranial large vessel occlusion, aneurysm, or vascular malformation  identified. 2. Tandem segments of mild-to-moderate stenosis in the left P2 and P3 segments. 3. Left V4 is diffusely irregular and attenuated with reversal of flow from the vertebrobasilar junction. 4. Fetal left PCA. These results were called by  telephone at the time of interpretation on 07/28/2018 at 7:15 pm to Dr. Milon DikesASHISH ARORA , who verbally acknowledged these results. Electronically Signed   By: Mitzi HansenLance  Furusawa-Stratton M.D.   On: 07/28/2018 19:18   Ct Head Wo Contrast  Result Date: 08/21/2018 CLINICAL DATA:  Follow-up examination for subdural hemorrhage. EXAM: CT HEAD WITHOUT CONTRAST TECHNIQUE: Contiguous axial images were obtained from the base of the skull through the vertex without intravenous contrast. COMPARISON:  Prior CT from 08/20/2018. FINDINGS: Brain: Left subdural collection continues to decrease in size, now measuring up to 4 mm at the level of the left parietal convexity and 7 mm at the level of the left occipital lobe (previously 6 mm and 8 mm). No evidence for interval bleeding or new/acute blood products. Minimal flattening and mass effect on the subjacent posterior left cerebral hemisphere without midline shift, also improving. Persistent ventriculomegaly with mild dilatation of the temporal horns of both lateral ventricles, relatively unchanged from previous, suggesting associated communicating hydrocephalus. No new intracranial hemorrhage. No acute large vessel territory infarct. No mass lesion. Vascular: No hyperdense vessel. Skull: Scalp soft tissues demonstrate no acute finding. Calvarium intact. Sinuses/Orbits: Globes and orbital soft tissues within normal limits. Paranasal sinuses are clear. No significant mastoid effusion. Other: None. IMPRESSION: 1. Continued interval decrease in size of left subdural collection, now measuring up to 4 mm in maximal thickness, previously 6 mm. No evidence for interval bleeding or new/acute blood products. 2. Persistent ventriculomegaly, suggesting  associated communicating hydrocephalus, similar to previous. 3. No other new acute intracranial abnormality. Electronically Signed   By: Rise MuBenjamin  McClintock M.D.   On: 08/21/2018 20:33   Ct Head Wo Contrast  Result Date: 08/20/2018 CLINICAL DATA:  Initial evaluation for acute altered mental status, unexplained fall. EXAM: CT HEAD WITHOUT CONTRAST TECHNIQUE: Contiguous axial images were obtained from the base of the skull through the vertex without intravenous contrast. COMPARISON:  Prior CT from 08/04/2018. FINDINGS: Brain: Previously identified left holo hemispheric subdural collection is decreased in size, now measuring up to 6 mm in maximal thickness at the level of the left frontal parietal convexity. Internal more hyperdense components of this collection are also decreased, with the collection now appearing more hypodense as compared to previous. No evidence for interval bleeding. Persistent minimal mass effect on the subjacent left cerebral hemisphere without midline shift. Basilar cisterns remain patent. No acute intracranial hemorrhage. No acute large vessel territory infarct. No mass lesion. Previously seen intraventricular hemorrhage has resolved, with no hemorrhage now seen. Ventricular dilatation relatively stable as compared to 08/04/2018, but increased relative to 07/17/2018. No significant periventricular hypodensity to suggest transependymal flow of CSF. Vascular: No hyperdense vessel. Scattered vascular calcifications noted within the carotid siphons. Skull: Scalp soft tissues within normal limits. Calvarium intact. Sinuses/Orbits: Globes and orbital soft tissues within normal limits. Visualized paranasal sinuses are largely clear. No mastoid effusion. Other: None. IMPRESSION: 1. Interval decrease in size of left holo hemispheric subdural collection, now measuring up to 6 mm in maximal thickness. No evidence for interval bleeding or other acute finding. Persistent but improved mass effect on  the subjacent left cerebral hemisphere without midline shift. 2. Interval resolution of previously seen intraventricular hemorrhage. Ventriculomegaly is relatively stable as compared to 08/04/2018, but increased relative to 07/17/2018. No findings to suggest transependymal flow of CSF. 3. No other new acute intracranial process. Electronically Signed   By: Rise MuBenjamin  McClintock M.D.   On: 08/20/2018 20:45   Ct Head Wo Contrast  Result Date: 08/04/2018 CLINICAL DATA:  Decreased alertness, evaluate for hydrocephalus EXAM: CT HEAD WITHOUT CONTRAST TECHNIQUE: Contiguous axial images were obtained from the base of the skull through the vertex without intravenous contrast. COMPARISON:  CT brain, 07/29/2018 FINDINGS: Brain: Interval decrease in attenuation of a heterogeneous left hemispheric collection, which remains unchanged in size at approximately 9 mm in maximum coronal thickness. There is mild mass effect on the left hemisphere without significant midline shift and no change in caliber or configuration of the ventricles. Minimal dependent blood product in the temporal horn of the right lateral ventricle. Periventricular white matter hypodensity. Vascular: No hyperdense vessel or unexpected calcification. Skull: Normal. Negative for fracture or focal lesion. Sinuses/Orbits: No acute finding. Other: None. IMPRESSION: 1. Interval decrease in attenuation of a heterogeneous left hemispheric collection, which remains unchanged in size at approximately 9 mm in maximum coronal thickness. There is mild mass effect on the left hemisphere without significant midline shift. 2. Unchanged prominence of the lateral and third ventricles. Minimal, unchanged dependent blood product in the occipital horn of the right lateral ventricle. 3. Periventricular white matter hypodensity, likely small-vessel white matter disease. Electronically Signed   By: Lauralyn Primes M.D.   On: 08/04/2018 12:14   Ct Head Wo Contrast  Result Date:  07/29/2018 CLINICAL DATA:  Right facial droop. EXAM: CT HEAD WITHOUT CONTRAST TECHNIQUE: Contiguous axial images were obtained from the base of the skull through the vertex without intravenous contrast. COMPARISON:  CT scan of Jul 28, 2018. FINDINGS: Brain: Grossly stable left frontal parietal subdural fluid collection is noted of mixed density. Biventricular diameter is measured at 52 mm which is not significantly changed compared to prior exam. This is concerning for developing communicating hydrocephalus. Stable amount of intraventricular hemorrhage is noted in the right posterior horn. Stable appearance of subarachnoid hemorrhage is seen in right occipital lobe. No new hemorrhage or infarction is noted. Mild chronic ischemic white matter disease is noted. Vascular: No hyperdense vessel or unexpected calcification. Skull: Normal. Negative for fracture or focal lesion. Sinuses/Orbits: No acute finding. Other: Staples are seen involving the right posterior parietal scalp. IMPRESSION: Grossly stable left frontal parietal subdural fluid collection is noted. Stable ventricular dilatation is noted concerning for communicating hydrocephalus. Stable amount of intraventricular hemorrhage is noted in the right posterior horn. Stable appearance of small amount of subarachnoid hemorrhage is seen in right occipital lobe. No new abnormality is noted compared to prior exam. Electronically Signed   By: Lupita Raider M.D.   On: 07/29/2018 09:39   Ct Angio Neck W Or Wo Contrast  Result Date: 07/28/2018 CLINICAL DATA:  68 y/o M; left-sided facial droop. Progressive somnolence. EXAM: CT ANGIOGRAPHY HEAD AND NECK TECHNIQUE: Multidetector CT imaging of the head and neck was performed using the standard protocol during bolus administration of intravenous contrast. Multiplanar CT image reconstructions and MIPs were obtained to evaluate the vascular anatomy. Carotid stenosis measurements (when applicable) are obtained utilizing  NASCET criteria, using the distal internal carotid diameter as the denominator. CONTRAST:  75mL OMNIPAQUE IOHEXOL 350 MG/ML SOLN COMPARISON:  07/28/2018 CT of the head. FINDINGS: CTA NECK FINDINGS Aortic arch: Standard branching. Imaged portion shows no evidence of aneurysm or dissection. No significant stenosis of the major arch vessel origins. Mild aortic calcific atherosclerosis. Right carotid system: No evidence of dissection, stenosis (50% or greater) or occlusion. Mild non stenotic calcific atherosclerosis of carotid bifurcation. Left carotid system: No evidence of dissection, stenosis (50% or greater) or occlusion. Mild non stenotic calcific atherosclerosis of carotid bifurcation. Vertebral arteries:  Left V1-V2 proximal V3 are occluded. The left distal V3 and V4 are diffusely irregular with attenuated enhancement and reversal of flow from the vertebrobasilar junction. There appears to be a cauda above the left C2 foramen transversarium (series 7, image 86). Widely patent and dominant right vertebral artery. Skeleton: No acute finding. Other neck: Negative. Upper chest: Right lung apex calcified granuloma. Review of the MIP images confirms the above findings CTA HEAD FINDINGS Anterior circulation: No significant stenosis, proximal occlusion, aneurysm, or vascular malformation. Posterior circulation: Left V1-V2 and proximal V3 are occluded, no visible dissection flap or wall hematoma. The left distal V3 and V4 are diffusely irregular with attenuated enhancement and reversal of flow from the vertebrobasilar junction. Widely patent and dominant right vertebral artery. There appears to be a cauda above the left C2 foramen transversarium (series 7, image 86). Tandem segments of mild-to-moderate stenosis in the left P2 and P3 segments. Large PICA and SCA noted bilaterally. Otherwise no significant stenosis, proximal occlusion, aneurysm, or vascular malformation in the posterior circulation. Venous sinuses: As  permitted by contrast timing, patent. Anatomic variants: Fetal left PCA. No right posterior communicating artery identified, likely hypoplastic or absent. Probable diminutive anterior communicating artery. Review of the MIP images confirms the above findings IMPRESSION: 1. Nonspecific left V1-V2 and proximal V3 occlusion. There appears to be a cutoff above the left C2 foramen transversarium suggesting a recent thromboembolic occlusion or dissection. 2. Left distal V3 and V4 are diffusely irregular with attenuated enhancement and reversal of flow from vertebrobasilar junction. 3. Patent carotid systems and dominant right vertebral artery without dissection, aneurysm, or hemodynamically significant stenosis utilizing NASCET criteria. 4. Mild calcific atherosclerosis of aortic arch and carotid bifurcations. CTA HEAD:. 1. Patent Circle of Willis. No intracranial large vessel occlusion, aneurysm, or vascular malformation identified. 2. Tandem segments of mild-to-moderate stenosis in the left P2 and P3 segments. 3. Left V4 is diffusely irregular and attenuated with reversal of flow from the vertebrobasilar junction. 4. Fetal left PCA. These results were called by telephone at the time of interpretation on 07/28/2018 at 7:15 pm to Dr. Milon DikesASHISH ARORA , who verbally acknowledged these results. Electronically Signed   By: Mitzi HansenLance  Furusawa-Stratton M.D.   On: 07/28/2018 19:18   Mr Brain Wo Contrast  Result Date: 07/29/2018 CLINICAL DATA:  Continued surveillance subdural. Paroxysmally episodes of aphasia. EXAM: MRI HEAD WITHOUT CONTRAST TECHNIQUE: Multiplanar, multiecho pulse sequences of the brain and surrounding structures were obtained without intravenous contrast. COMPARISON:  CT head yesterday.  CTA head neck yesterday. FINDINGS: Brain: No acute infarction, parenchymal hemorrhage, or intra-axial mass lesion. 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 Extra-axial hematoma, LEFT  subdural, heterogeneous signal intensity, with moderate mass effect on the LEFT hemisphere, measures 10 mm thickness on coronal series 11, image 11. Vascular: Flow voids in the carotid, basilar, and RIGHT vertebral arteries are preserved. Absent flow void/slow or reversed flow LEFT vertebral; please see CTA report for additional detail regarding flow reversal, and extracranial disease. Skull and upper cervical spine: No fracture. Sinuses/Orbits: Unremarkable. Other: None. 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 No evidence for posterior circulation or hemispheric infarct, despite significant disease in both vertebrals on CTA. 10 mm thick LEFT subdural hematoma, with mass effect on the LEFT hemisphere. No midline shift due to pre-existing atrophy, but flattening of the cortical sulci is observed. Electronically Signed   By: Elsie StainJohn T Curnes M.D.   On: 07/29/2018 13:42   [place] Scrotum  Result Date: 07/29/2018 CLINICAL DATA:  Scrotal edema x1  day. EXAM: ULTRASOUND OF SCROTUM TECHNIQUE: Complete ultrasound examination of the testicles, epididymis, and other scrotal structures was performed. COMPARISON:  None. FINDINGS: Right testicle Measurements: 4.2 x 2.2 x 3.3 cm. No mass or microlithiasis visualized. Left testicle Measurements: 4.7 x 2.8 x 2.9 cm. No mass or microlithiasis visualized. Right epididymis:  Normal in size and appearance. Left epididymis:  Normal in size and appearance. Hydrocele: There is a very large left-sided hydrocele. There is a small to moderate sized right-sided hydrocele. Varicocele:  None visualized. IMPRESSION: 1. Large left-sided hydrocele. Small to moderate size right-sided hydrocele. 2. Otherwise, no additional significant sonographic abnormality detected. Electronically Signed   By: Katherine Mantle M.D.   On: 07/29/2018 17:02   Ct Head Code Stroke Wo Contrast  Result Date: 07/28/2018 CLINICAL DATA:  Code stroke. LEFT-sided facial  droop. Progressive somnolence. Last seen normal 1745 hours. EXAM: CT HEAD WITHOUT CONTRAST TECHNIQUE: Contiguous axial images were obtained from the base of the skull through the vertex without intravenous contrast. COMPARISON:  Multiple priors, most recent 07/17/2018 FINDINGS: Brain: No acute stroke or parenchymal hemorrhage. No intracranial intra-axial mass lesion. Developing hydrocephalus, biventricular diameter now 52 mm, as compared with 46 mm on 05/07. LEFT frontoparietal subdural collection is also increased, now 8 mm as compared with 4-5 mm previous. The attenuation of the subdural collection is now becoming more isodense, with some slight hyperdense residual collection. Intraventricular blood is improving but still layering. Subarachnoid blood is resolving. Vascular: No hyperdense vessel or unexpected calcification. Skull: Normal. Negative for fracture or focal lesion. Sinuses/Orbits: Negative Other: None ASPECTS ([place]ber[place] Stroke Program Early CT Score) - Ganglionic level infarction (caudate, lentiform nuclei, internal capsule, insula, M1-M3 cortex): 7 - Supraganglionic infarction (M4-M6 cortex): 3 Total score (0-10 with 10 being normal): 10 IMPRESSION: 1. 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 Biventricular diameter is 52 mm, which is significant. Resolving subarachnoid blood. 2. Increasing size of LEFT frontoparietal subdural collection, now 8 mm. 3. ASPECTS is 10. * These results were called by telephone at the time of interpretation on 07/28/2018 at 6:50 pm to Dr. Wilford Corner , who verbally acknowledged these results. Electronically Signed   By: Elsie Stain M.D.   On: 07/28/2018 18:56    Labs:  Basic Metabolic Panel: BMP Latest Ref Rng & Units 08/22/2018 08/15/2018 08/08/2018  Glucose 70 - 99 mg/dL 161(W) 960(A) 540(J)  BUN 8 - 23 mg/dL 15 16 18   Creatinine 0.61 - 1.24 mg/dL 8.11 9.14 7.82  Sodium 135 - 145 mmol/L 138 138 139  Potassium 3.5 -  5.1 mmol/L 3.6 3.7 3.5  Chloride 98 - 111 mmol/L 102 103 101  CO2 22 - 32 mmol/L 28 25 26   Calcium 8.9 - 10.3 mg/dL 8.9 9.5(A) 2.1(H)    CBC: CBC Latest Ref Rng & Units 08/22/2018 08/15/2018 08/08/2018  WBC 4.0 - 10.5 K/uL 3.7(L) 2.8(L) 3.2(L)  Hemoglobin 13.0 - 17.0 g/dL 08.6 12.6(L) 12.4(L)  Hematocrit 39.0 - 52.0 % 40.0 38.6(L) 37.7(L)  Platelets 150 - 400 K/uL 151 113(L) 154    CBG: Recent Labs  Lab 08/21/18 1134 08/21/18 1638 08/21/18 2128 08/22/18 0618 08/22/18 1159  GLUCAP 214* 245* 234* 172* 180*    Brief HPI:   [name] Frank is a 68 year old male with history of T2DM, HTN, CVA, fall 07/30/2018 with subsequent TBI/SDH and rib fractures.  His hospital course was significant for A. fib with RVR and he was started on low-dose aspirin prior to discharge on 07/22/2018.  He was readmitted  on 07/28/2018 with 3-day history of progressive lethargy with acute onset right facial droop and slurred speech.  He was noted to be hypotensive and had acute renal failure with waxing and waning of speech difficulties.  CT of head done showing grossly stable left frontal parietal subdural hemorrhage with concerns of developing hydrocephalus.  Dr. Johnsie Cancel was consulted for input and recommended serial x-rays for monitoring and did not feel that patient needed any surgery.  He felt that patient's waxing and waning of mental status was felt it was due to seizures and as he did not show any clinical signs of hydrocephalus.   EEG was done for work-up and revealed left hemispheric slowing with rare sharp left temporal waves suggestive of epileptogenic potential.  He was loaded with Keppra and started on 500 mg twice daily he was also found to have scrotal edema with erythema and ultrasound done revealing large left-sided hydrocele and small to moderate right hydrocele.  He was started on Augmentin for treatment.  He continued to be limited by dizziness with significant cognitive linguistic deficits,  perseveration, confusion and weakness affecting ADLs and mobility.  CIR was recommended due to functional decline   Hospital Course: [name] Frank was admitted to rehab 08/01/2018 for inpatient therapies to consist of PT, ST and OT at least three hours five days a week. Past admission physiatrist, therapy team and rehab RN have worked together to provide customized collaborative inpatient rehab. He was noted to be orthostatic at admission and TEDs were ordered for BP support. Losartan continues to be on hold.  Cardizem was decreased to 180 mg to prevent bradycardia and orthostatic symptoms. His symptoms have resolved with improvement in activity tolerance and increase in intake. Heart rate has been controlled on current dose Cardizem and Toprol XL.  On 5/25, he developed somnolence with inability to talk and had difficulty following commands. CT of head was repeated showing no significant changes. Urine culture was negative for infection. Lytes were noted to be WNL and CBC without leucocytosis.   Dr. Johnsie Cancel was consulted for input and felt that patient's cognitive decline likely due to seizures.  Keppra was increased to thousand milligrams twice daily however he was unable to tolerate this due to its sedation therefore dose was decreased to 750 mg twice daily.  Ritalin was added for activation with improvement in mentation and resolution of lethargy.  Diabetes has been monitored with ac/hs cbg checks. As po intake improved BS started trending upwards and glucotrol was resumed. This has been titrated up to 10 mg bid by 6/11.  He was found to have difficulty voiding with urinary retention.  Flomax was added and he required scheduled toileting program to help with resolution of symptoms.  He was maintained on 3 week course of Augmentin and scrotal erythema and edema has improved. He did report having had scrotal swelling for weeks PTA and he has an appointment for GU follow up after discharge. Serial CBC showed  H/H is stable and leucopenia is slowly resolving. Lytes have been WNL.  He has made steady progress during his rehab stay but continues to lack insight into deficits and has poor safety awareness.  He currently requires supervision to min assist level.  Care as well as fall risk was discussed with wife and she was not interested in SNF. She elected on discharge to home with hired help. He will continue to receive further follow-up HHPT, HHOT, HHST and HHRN by [facility] after discharge   Rehab  course: During patient's stay in rehab weekly team conferences were held to monitor patient's progress, set goals and discuss barriers to discharge. At admission, patient required mod assist with mobility and basic self-care tasks.  He exhibited moderate cognitive impairments with difficulty following complex 2 step commands, head poor insight into deficits and required increased time for processing.  Speech rate was significantly slow and difficult to comprehend. He  has had improvement in activity tolerance, balance, postural control as well as ability to compensate for deficits. He is able to complete ADL tasks with min assist and needs max assist for selective attention to task. He is unable to accurately complete or engage in cognitive tasks due to significant cognitive impairments and lack of insight into deficits. He requires min to mod assist with cognitive tasks. Family  Education was completed with wife regarding assistance with cognitive tasks, self care tasks and with mobility.   Disposition: Home  Diet: Heart healthy/Carb modified.   Special Instructions: 1. Monitor blood sugars bid and follow up with primary MD for further adjustment in hypoglycemic regimen.  2. No driving for at least 6 months due to seizures.  3. Repeat CBC in 1-2 weeks to monitor leucopenia.   Discharge Instructions    Ambulatory referral to Neurology   Complete by: As directed    An appointment is requested in  approximately: 2 -3 weeks. TBI/seizures   Ambulatory referral to Physical Medicine Rehab   Complete by: As directed      Allergies as of 08/22/2018   No Known Allergies     Medication List    STOP taking these medications   amoxicillin-clavulanate 875-125 MG tablet Commonly known as: AUGMENTIN   gabapentin 300 MG capsule Commonly known as: NEURONTIN   Invokamet [phone] MG Tabs Generic drug: Canagliflozin-metFORMIN HCl   losartan 50 MG tablet Commonly known as: COZAAR     TAKE these medications   acetaminophen 325 MG tablet Commonly known as: TYLENOL Take 2 tablets (650 mg total) by mouth every 6 (six) hours as needed for mild pain.   atorvastatin 80 MG tablet Commonly known as: LIPITOR Take 1 tablet (80 mg total) by mouth daily.   diltiazem 180 MG 24 hr capsule Commonly known as: CARDIZEM CD Take 1 capsule (180 mg total) by mouth daily. What changed:   medication strength  how much to take   docusate sodium 100 MG capsule Commonly known as: COLACE Take 1 capsule (100 mg total) by mouth 2 (two) times daily. For constipation Notes to patient: Available over the counter   glipiZIDE 10 MG 24 hr tablet Commonly known as: GLUCOTROL XL Take 1 tablet (10 mg total) by mouth 2 (two) times daily. Notes to patient: Take with meals    levETIRAcetam 750 MG tablet Commonly known as: KEPPRA Take 1 tablet (750 mg total) by mouth 2 (two) times daily. What changed:   medication strength  how much to take   methylphenidate 10 MG tablet--Rx# 60 pills Commonly known as: RITALIN Take 1 tablet (10 mg total) by mouth 2 (two) times daily with breakfast and lunch. Notes to patient: To help with activation/energy levels/attention   metoprolol succinate 100 MG 24 hr tablet Commonly known as: TOPROL-XL Take 1 tablet (100 mg total) by mouth daily. Take with or immediately following a meal.   tamsulosin 0.4 MG Caps capsule Commonly known as: FLOMAX Take 1 capsule (0.4 mg  total) by mouth daily after supper. Notes to patient: To help with urination.  Follow-up Information    Kirsteins, Victorino Sparrow, MD Follow up.   Specialty: Physical Medicine and Rehabilitation Why: Office will call you with follow up appointment Contact information: [address] [place] [address] [phone]        Pearline Cables, MD. Call on 08/25/2018.   Specialty: Family Medicine Why: for post hospital follow up Contact information: [address] [place] [address] [phone]        Hedwig Morton., MD. Call.   Specialty: Cardiology Why: for follow up appointment Contact information: [address] [place] [address] [phone]        Jadene Pierini, MD. Call on 08/25/2018.   Specialty: Neurosurgery Why: For follow up post TBI/subdural hemorrhage Contact information: [address] [place] [address] [phone]        Ihor Gully, MD Follow up on 09/09/2018.   Specialty: Urology Why: Be there at 9:30 for 9:40 appointment/follow up on hydrocele Contact information: [address] [place] [address] [phone]        [facility] Follow up.   Specialty: Neurology Why: Office will call you with follow up appointment Contact information: [address] [place] [address] [phone]          Signed: Jacquelynn Cree 08/26/2018, 11:48 AM

## 2018-08-22 NOTE — Progress Notes (Signed)
[facility] PHYSICAL MEDICINE & REHABILITATION PROGRESS NOTE   Subjective/Complaints:  Pt denies HA, no other pains Aware of d/c date , asking appropriate questions  Review of systems:limited due to decrease attn and concentration  Objective:   Ct Head Wo Contrast  Result Date: 08/21/2018 CLINICAL DATA:  Follow-up examination for subdural hemorrhage. EXAM: CT HEAD WITHOUT CONTRAST TECHNIQUE: Contiguous axial images were obtained from the base of the skull through the vertex without intravenous contrast. COMPARISON:  Prior CT from 08/20/2018. FINDINGS: Brain: Left subdural collection continues to decrease in size, now measuring up to 4 mm at the level of the left parietal convexity and 7 mm at the level of the left occipital lobe (previously 6 mm and 8 mm). No evidence for interval bleeding or new/acute blood products. Minimal flattening and mass effect on the subjacent posterior left cerebral hemisphere without midline shift, also improving. Persistent ventriculomegaly with mild dilatation of the temporal horns of both lateral ventricles, relatively unchanged from previous, suggesting associated communicating hydrocephalus. No new intracranial hemorrhage. No acute large vessel territory infarct. No mass lesion. Vascular: No hyperdense vessel. Skull: Scalp soft tissues demonstrate no acute finding. Calvarium intact. Sinuses/Orbits: Globes and orbital soft tissues within normal limits. Paranasal sinuses are clear. No significant mastoid effusion. Other: None. IMPRESSION: 1. Continued interval decrease in size of left subdural collection, now measuring up to 4 mm in maximal thickness, previously 6 mm. No evidence for interval bleeding or new/acute blood products. 2. Persistent ventriculomegaly, suggesting associated communicating hydrocephalus, similar to previous. 3. No other new acute intracranial abnormality. Electronically Signed   By: Rise MuBenjamin  McClintock M.D.   On: 08/21/2018 20:33   Ct Head Wo  Contrast  Result Date: 08/20/2018 CLINICAL DATA:  Initial evaluation for acute altered mental status, unexplained fall. EXAM: CT HEAD WITHOUT CONTRAST TECHNIQUE: Contiguous axial images were obtained from the base of the skull through the vertex without intravenous contrast. COMPARISON:  Prior CT from 08/04/2018. FINDINGS: Brain: Previously identified left holo hemispheric subdural collection is decreased in size, now measuring up to 6 mm in maximal thickness at the level of the left frontal parietal convexity. Internal more hyperdense components of this collection are also decreased, with the collection now appearing more hypodense as compared to previous. No evidence for interval bleeding. Persistent minimal mass effect on the subjacent left cerebral hemisphere without midline shift. Basilar cisterns remain patent. No acute intracranial hemorrhage. No acute large vessel territory infarct. No mass lesion. Previously seen intraventricular hemorrhage has resolved, with no hemorrhage now seen. Ventricular dilatation relatively stable as compared to 08/04/2018, but increased relative to 07/17/2018. No significant periventricular hypodensity to suggest transependymal flow of CSF. Vascular: No hyperdense vessel. Scattered vascular calcifications noted within the carotid siphons. Skull: Scalp soft tissues within normal limits. Calvarium intact. Sinuses/Orbits: Globes and orbital soft tissues within normal limits. Visualized paranasal sinuses are largely clear. No mastoid effusion. Other: None. IMPRESSION: 1. Interval decrease in size of left holo hemispheric subdural collection, now measuring up to 6 mm in maximal thickness. No evidence for interval bleeding or other acute finding. Persistent but improved mass effect on the subjacent left cerebral hemisphere without midline shift. 2. Interval resolution of previously seen intraventricular hemorrhage. Ventriculomegaly is relatively stable as compared to 08/04/2018, but  increased relative to 07/17/2018. No findings to suggest transependymal flow of CSF. 3. No other new acute intracranial process. Electronically Signed   By: Rise MuBenjamin  McClintock M.D.   On: 08/20/2018 20:45   Recent Labs    08/22/18 [phone]  WBC 3.7*  HGB 13.2  HCT 40.0  PLT 151   Recent Labs    08/22/18 0639  NA 138  K 3.6  CL 102  CO2 28  GLUCOSE 186*  BUN 15  CREATININE 0.84  CALCIUM 8.9    Intake/Output Summary (Last 24 hours) at 08/22/2018 0749 Last data filed at 08/21/2018 1257 Gross per 24 hour  Intake 240 ml  Output -  Net 240 ml     Physical Exam: Vital Signs Blood pressure 137/68, pulse (!) 48, temperature 98.2 F (36.8 C), temperature source Oral, resp. rate 20, weight 95.8 kg, SpO2 99 %. Constitutional: No distress . Vital signs reviewed. HENT: Normocephalic.  Atraumatic. Eyes: EOMI.  No discharge. Cardiovascular: No JVD. Respiratory: Normal effort. GI: Non-distended. Musc: No edema or tenderness in extremities.no shoulder pain with ROM  Neurologic: Alert Motor: 4/5 throughout Patient: Appears to have normal mood and affect. Oriented x 3  Ext No C/C/E  Assessment/Plan: 1. Functional deficits secondary to left subdural hematoma, subarachnoid hemorrhage following a fall at home which require 3+ hours per day of interdisciplinary therapy in a comprehensive inpatient rehab setting.  Physiatrist is providing close team supervision and 24 hour management of active medical problems listed below.  Physiatrist and rehab team continue to assess barriers to discharge/monitor patient progress toward functional and medical goals  Care Tool:  Bathing  Bathing activity did not occur: Refused Body parts bathed by patient: Right arm, Left arm, Chest, Abdomen, Front perineal area, Buttocks, Right upper leg, Left upper leg, Right lower leg, Left lower leg, Face   Body parts bathed by helper: Right lower leg, Left lower leg Body parts n/a: Right upper leg, Left upper  leg, Right lower leg, Left lower leg   Bathing assist Assist Level: Minimal Assistance - Patient > 75%     Upper Body Dressing/Undressing Upper body dressing   What is the patient wearing?: Pull over shirt    Upper body assist Assist Level: Set up assist    Lower Body Dressing/Undressing Lower body dressing      What is the patient wearing?: Pants, Incontinence brief     Lower body assist Assist for lower body dressing: Minimal Assistance - Patient > 75%     Toileting Toileting    Toileting assist Assist for toileting: Minimal Assistance - Patient > 75%     Transfers Chair/bed transfer  Transfers assist     Chair/bed transfer assist level: Contact Guard/Touching assist     Locomotion Ambulation   Ambulation assist      Assist level: Minimal Assistance - Patient > 75% Assistive device: Walker-rolling Max distance: 5545ft   Walk 10 feet activity   Assist     Assist level: Minimal Assistance - Patient > 75% Assistive device: Walker-rolling   Walk 50 feet activity   Assist Walk 50 feet with 2 turns activity did not occur: Safety/medical concerns  Assist level: Minimal Assistance - Patient > 75% Assistive device: Walker-rolling    Walk 150 feet activity   Assist Walk 150 feet activity did not occur: Safety/medical concerns         Walk 10 feet on uneven surface  activity   Assist Walk 10 feet on uneven surfaces activity did not occur: Safety/medical concerns         Wheelchair     Assist Will patient use wheelchair at discharge?: (to be used for community mobility with dependent assist from wife) Type of Wheelchair: Manual Wheelchair activity did not occur: N/A  Wheelchair assist level: Minimal Assistance - Patient > 75% Max wheelchair distance: 29'    Wheelchair 50 feet with 2 turns activity    Assist    Wheelchair 50 feet with 2 turns activity did not occur: N/A       Wheelchair 150 feet activity     Assist  Wheelchair 150 feet activity did not occur: N/A          Medical Problem List and Plan: 1.Functional declinesecondary toAMS, 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 Exhibits signs of aphasia,cont ritalin  10mg  will wean as OP  plan d/c today  SDH progressively improving even between 6/10 and 6/11 2. Antithrombotics: -DVT/anticoagulation:Mechanical:Sequential compression devices, below kneeBilateral lower extremities -antiplatelet therapy: N/A 3. Pain Management:Tylenol prn. 4. Mood:LCSW to follow for evaluation and support. -antipsychotic agents: N/A 5. Neuropsych: This patientis notcapable of making decisions on hisown behalf. He has the capacity to designate a POA  6. Skin/Wound Care:routine pressure relief measures. 7. Fluids/Electrolytes/Nutrition:Monitor I/Os. 8. T2DM: Monitor BS ac/hs. SSI for elevated BS.Adjust regimen as needed.  Glucotrol 2.5 added on 5/30 Increase to 5mg  BID on 6/8  Remains labile and elevated on 6/8 CBG (last 3)  Recent Labs    08/21/18 1638 08/21/18 2128 08/22/18 0618  GLUCAP 245* 234* 172*  CBGs still labile but glucotrol dose just increased , PCP may need to restart metformin as OP  9. CAD/PAF/A flutter: Off Plavix due to SDH. Monitor HR bid. Continue Cardizem CD and Toprol XL. Will order orthostatic vitals due to reports of dizziness. Ordered TEDs. Continue to hold Losartan.Pt with regular rhythm at present Vitals:   08/21/18 1951 08/22/18 0429  BP: (!) 124/56 137/68  Pulse: (!) 52 (!) 48  Resp: 20 20  Temp: 97.7 F (36.5 C) 98.2 F (36.8 C)  SpO2: 99% 99%   Reduced cardizem to 180mg  per day on 6/8 due to bradycardia and soft BP, goal is HR>50bpm, BP in range still with brady, cont to monitor improving   10. Left > Right hydrocele:11. AKI with hyponateremia: Resolved   12. New onset seizures: Continue Keppra 750 mg bid.Overly sedated  on 1000mg    Keppra has been increased due to suspected seizure activity but no observe seizures   13.  Urinary retention: Continue Flomax monitor for orthostatic hypotension 14.  Leukopenia mild and stable will f/u with PCP as OP , afebrile  LOS: 21 days A FACE TO [place] E Kirsteins 08/22/2018, 7:49 AM

## 2018-08-22 NOTE — Progress Notes (Signed)
Pt discharged home with family. Discharge instructions given by Pam. PA. Belongings and equipment sent with pt. No further questions from pt or family. Pt stable at time of discharge.   [name] Frank W [name] Frank

## 2018-08-22 NOTE — Discharge Instructions (Signed)
Inpatient Rehab Discharge Instructions  HELMER DULL Discharge date and time: 08/22/18   Activities/Precautions/ Functional Status: Activity: no lifting, driving, or strenuous exercise till cleared by MD Diet: cardiac diet Wound Care: Keep wound clean and dry --can use Goldbond powder to peri area for rash/irritation.   Functional status:  ___ No restrictions     ___ Walk up steps independently _X__ 24/7 supervision/assistance   ___ Walk up steps with assistance ___ Intermittent supervision/assistance  ___ Bathe/dress independently ___ Walk with walker     ___ Bathe/dress with assistance ___ Walk Independently    ___ Shower independently ___ Walk with assistance    _X__ Shower with assistance _X__ No alcohol     ___ Return to work/school ________   Special Instructions: 1. Wife to assist with medication and financial management. 2. NO aspirin, aspirin containing products, NSAIDS--aleve, naprosyn, motrin, ibuprofen,  3. Needs stand by assist when walking or with transfers. Also need to toilet him 30-45 minutes after meals and before bedtime.  4. Monitor blood sugars twice a day before meals--alternate times before  breakfast and supper with  lunch and bedtime the next day.     Per [facility] statutes:  patients with seizures are not allowed to drive until  they have been seizure-free for six months. Do not use heavy equipment or power tools. Avoid working on ladders or at heights. Take showers instead of baths--no hot tubs. Ensure the water temperature is not too high on the home water heater. No swimming, diving, avoid high altitudes, strobe lights and make sure to get plenty of sleep. NO alcohol. When caring for infants or small children, sit down when holding, feeding, or changing them to minimize risk of injury to the child in the event you have a seizure.   COMMUNITY REFERRALS UPON DISCHARGE:    Home Health:   PT, OT, SPT, RN     [place]   Date of last service:08/22/2018  Medical Equipment/Items Ordered:WHEELCHAIR  Agency/Supplier:[employer]  [phone]   GENERAL COMMUNITY RESOURCES FOR PATIENT/FAMILY: Support Groups:TBI SUPPORT GROUP THE SECOND Tuesday OF EACH MONTH @ 7:00-8:30 PM ON THE REHAB UNIT QUESTIONS CONTACT LUCY [phone]  My questions have been answered and I understand these instructions. I will adhere to these goals and the provided educational materials after my discharge from the hospital.  Patient/Caregiver Signature _______________________________ Date __________  Clinician Signature _______________________________________ Date __________  Please bring this form and your medication list with you to all your follow-up doctor's appointments.

## 2018-08-25 ENCOUNTER — Telehealth: Payer: Self-pay

## 2018-08-25 NOTE — Telephone Encounter (Signed)
Copied from [place] [phone]. Topic: General - Other >> Aug 25, 2018  1:44 PM Rainey Pines A wrote: Roselie Awkward physical therapist called to let Dr. Lorelei Pont know that the home health assessment has been completed and he will be seeing patient for strengthening,balance, and mobility training and will fax order once frequency is determined.

## 2018-08-26 ENCOUNTER — Telehealth: Payer: Self-pay

## 2018-08-26 NOTE — Telephone Encounter (Signed)
Wife did ask about Cardizem, if it came in a tablet form and could it be changed. I told her to contact the Cardiologist for that medication.

## 2018-08-26 NOTE — Telephone Encounter (Signed)
Transitional Care call-who you talked with- Lynda-wife    1. Are you/is patient experiencing any problems since coming home? No Are there any questions regarding any aspect of care? No 2. Are there any questions regarding medications administration/dosing? Are meds being taken as prescribed? Patient should review meds with caller to confirm 3. Have there been any falls? 4. Has Home Health been to the house and/or have they contacted you? Yes, she did ask me about the number of visits. She wanted to know how many visits the insurance would pay for and is it 35 visits each for PT and ST. I told wife to call the insurance company to ask. If not, have you tried to contact them? Can we help you contact them? 5. Are bowels and bladder emptying properly? Difficulty, takes Flomax Are there any unexpected incontinence issues? Takes Flomax If applicable, is patient following bowel/bladder programs? 6. Any fevers, problems with breathing, unexpected pain? No 7. Are there any skin problems or new areas of breakdown? No 8. Has the patient/family member arranged specialty MD follow up (ie cardiology/neurology/renal/surgical/etc)?  Yes Can we help arrange? 9. Does the patient need any other services or support that we can help arrange? No 10. Are caregivers following through as expected in assisting the patient? Yes 11. Has the patient quit smoking, drinking alcohol, or using drugs as recommended? Yes Appointment time 12:30 pm arrival 12:15 pm with Dr. Letta Pate on 09/04/2018 [place] [address]

## 2018-08-28 ENCOUNTER — Telehealth: Payer: Self-pay | Admitting: Family Medicine

## 2018-08-28 NOTE — Telephone Encounter (Signed)
Agapito Games, RN with [facility] calling and states that she had seen the patient yesterday and his heart rate was 44 BPM. States that she called and left a message with his cardiologist, but has not heard back from them. States that she wanted to let Dr Lorelei Pont know as well. Please advise.  CB#: [phone]

## 2018-08-28 NOTE — Telephone Encounter (Signed)
I am not sure if cardiology has addressed.  Tried to call his cardiologist at [facility] but there was a long hold time They just got back from seeing neurosurgery, Dr. Zada Finders- they did not comment that his pulse was slow.  His wife comments that he seems more confused and "beligerant" than normal.  It is hard for me to get a feel about if this is new or alarming to her.  She thinks he might just be tired  I called [facility]-at their visit pulse 56, BP 109/69.  His doc did not notice any acute change in his mentation Called Lynda back- advised her that if she notes any alarming departure from [name] Frank's baseline certainly take him to the ER.  Otherwise she can monitor him closely

## 2018-08-28 NOTE — Telephone Encounter (Signed)
FYI

## 2018-09-02 NOTE — Progress Notes (Addendum)
[place] at [facility] [address] [place], [place] [address] [phone]  Date:  09/03/2018   Name:  [name] Frank   DOB:  [birth year]   MRN:  [id number]  PCP:  Darreld Mclean, MD    Chief Complaint: Hospitalization Follow-up (diltiazam hard to swallow)   History of Present Illness:  [name] Frank is a 68 y.o. very pleasant male patient who presents with the following:  Here today for an inpatient follow-up visit Gentleman with history of A. 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  He also developed A. fib with RVR during this admission. He was eventually discharged on May 12, but then was readmitted from May 18-22 with mental status changes.  This was thought to be focal seizure activity due to hematoma breakdown products, he was started on Keppra and discharged home  [name] Frank was then admitted to rehab on May 22, just discharged home on June 12-summary follows:  Hospital Course: [name] was admitted to rehab 08/01/2018 for inpatient therapies to consist of PT, ST and OT at least three hours five days a week. Past admission physiatrist, therapy team and rehab RN have worked together to provide customized collaborative inpatient rehab. He was noted to be orthostatic at admission and TEDs were ordered for BP support. Losartan continues to be on hold.  Cardizem was decreased to 180 mg to prevent bradycardia and orthostatic symptoms. His symptoms have resolved with improvement in activity tolerance and increase in intake. Heart rate has been controlled on current dose Cardizem and Toprol XL.  On 5/25, he developed somnolence with inability to talk and had difficulty following commands. CT of head was repeated showing no significant changes. Urine  culture was negative for infection. Lytes were noted to be WNL and CBC without leucocytosis.   Dr. Venetia Constable was consulted for input and felt that patient's cognitive decline likely due to seizures.  Keppra was increased to thousand milligrams twice daily however he was unable to tolerate this due to its sedation therefore dose was decreased to 750 mg twice daily.  Ritalin was added for activation with improvement in mentation and resolution of lethargy.  Diabetes has been monitored with ac/hs cbg checks. As po intake improved BS started trending upwards and glucotrol was resumed. This has been titrated up to 10 mg bid by 6/11.  He was found to have difficulty voiding with urinary retention.  Flomax was added and he required scheduled toileting program to help with resolution of symptoms.  He was maintained on 3 week course of Augmentin and scrotal erythema and edema has improved. He did report having had scrotal swelling for weeks PTA and he has an appointment for GU follow up after discharge. Serial CBC showed H/H is stable and leucopenia is slowly resolving. Lytes have been WNL.  He has made steady progress during his rehab stay but continues to lack insight into deficits and has poor safety awareness.  He currently requires supervision to min assist level.  Care as well as fall risk was discussed with wife and she was not interested in SNF. She elected on discharge to home with hired help. He will continue to receive further follow-up HHPT, [place], HHST and [place] by [facility] after discharge  Rehab course:  During patient's stay in rehab weekly team conferences were held to monitor patient's progress, set goals and discuss barriers to discharge. At admission, patient required mod assist with mobility and basic self-care tasks.  He exhibited moderate cognitive impairments with difficulty following complex 2 step commands, head poor insight into deficits and required increased time for processing.   Speech rate was significantly slow and difficult to comprehend. He  has had improvement in activity tolerance, balance, postural control as well as ability to compensate for deficits. He is able to complete ADL tasks with min assist and needs max assist for selective attention to task. He is unable to accurately complete or engage in cognitive tasks due to significant cognitive impairments and lack of insight into deficits. He requires min to mod assist with cognitive tasks. Family  Education was completed with wife regarding assistance with cognitive tasks, self care tasks and with mobility.   Disposition: Home Diet: Heart healthy/Carb modified.   Special Instructions: 1. Monitor blood sugars bid and follow up with primary MD for further adjustment in hypoglycemic regimen.  2. No driving for at least 6 months due to seizures.  3. Repeat CBC in 1-2 weeks to monitor leucopenia.   His neurosurgeon is Dr. Maurice Smallstergard- last visit with him on 6/18 Cardiologist is Dr. Dot [facility] - seeing him next week  They are seeing physical medicine rehab actually tomorrow  Here today with his wife [name] Frank who has been taking care of him He is using a walker and his wheelchair to get around He is able to walk ok but is worried about his balance He has fallen at home- most recently he slid off the bedside commode.  Most recent fall was about 3 days ago  He is getting PT, OT, aid for bathing, speech therapy and RN who is  He is getting PT twice a week, OT once a week   He is able to dress but it just takes longer He is able to feed himself, chew and swallow normally He admits to being nervous about falling all the time His speech is getting much better, just slow.    He seems to be thinking fairly clearly according to his wife, but does have some difficulty esp with phone conversations.  They had to do a phone financial transaction recently and it was a challenge  They are checking his glucose at  home- ranging from 145 to just over 200  He is taking glipizide 10 mg BID   They do not have their own BP cuff but his vitals are checked frequently by various home health workers  They have noted urinary incontinence as well as scrotal swelling since his recent illness.  He has a urology appointment next week In addition to incontinence and scrotal swelling, he has had a lot of groin irritation and redness They are using a topical antifungal cream on his groin   Lab Results  Component Value Date   HGBA1C 8.6 (H) 07/14/2018    Patient Active Problem List   Diagnosis Date Noted  . Bradycardia   . Leukopenia   . Hypoalbuminemia due to protein-calorie malnutrition (HCC)   . Hydrocele   . Diabetes mellitus type 2 in obese (HCC)   . SDH (subdural hematoma) (HCC) 08/01/2018  . Hydrocephalus in adult [facility](HCC) 07/29/2018  . Hydrocephalus (HCC) 07/28/2018  . Mild renal insufficiency 07/28/2018  . Paroxysmal atrial fibrillation (HCC) 07/28/2018  . Subdural hematoma (HCC) 07/14/2018  . Hypertension, essential 01/16/2018  .  Controlled type 2 diabetes mellitus without complication, without long-term current use of insulin (HCC) 01/16/2018  . Coronary artery disease involving native heart without angina pectoris 01/16/2018    Past Medical History:  Diagnosis Date  . Diabetes mellitus   . Heart disease   . Hyperlipemia   . Hypertension     Past Surgical History:  Procedure Laterality Date  . catarracts    . OTHER SURGICAL HISTORY  2016   HEART STENT  . TONSILLECTOMY      Social History   Tobacco Use  . Smoking status: Never Smoker  . Smokeless tobacco: Never Used  Substance Use Topics  . Alcohol use: Yes    Comment: VERY RARELY  . Drug use: No    Family History  Problem Relation Age of Onset  . Stroke Father   . Hypertension Father   . Hyperlipidemia Father   . Early death Mother   . Colitis Sister   . Appendicitis Brother     No Known Allergies  Medication list  has been reviewed and updated.  Current Outpatient Medications on File Prior to Visit  Medication Sig Dispense Refill  . acetaminophen (TYLENOL) 325 MG tablet Take 2 tablets (650 mg total) by mouth every 6 (six) hours as needed for mild pain.    Marland Kitchen. atorvastatin (LIPITOR) 80 MG tablet Take 1 tablet (80 mg total) by mouth daily. 30 tablet 1  . docusate sodium (COLACE) 100 MG capsule Take 1 capsule (100 mg total) by mouth 2 (two) times daily. For constipation 60 capsule 0  . glipiZIDE (GLUCOTROL XL) 10 MG 24 hr tablet Take 1 tablet (10 mg total) by mouth 2 (two) times daily. 60 tablet 1  . levETIRAcetam (KEPPRA) 750 MG tablet Take 1 tablet (750 mg total) by mouth 2 (two) times daily. 60 tablet 1  . methylphenidate (RITALIN) 10 MG tablet Take 1 tablet (10 mg total) by mouth 2 (two) times daily with breakfast and lunch. 60 tablet 0  . metoprolol succinate (TOPROL-XL) 100 MG 24 hr tablet Take 1 tablet (100 mg total) by mouth daily. Take with or immediately following a meal. 30 tablet 0  . tamsulosin (FLOMAX) 0.4 MG CAPS capsule Take 1 capsule (0.4 mg total) by mouth daily after supper. 30 capsule 1  . diltiazem (CARDIZEM CD) 180 MG 24 hr capsule Take 1 capsule (180 mg total) by mouth daily. (Patient not taking: Reported on 09/03/2018) 30 capsule 1   No current facility-administered medications on file prior to visit.     Review of Systems:  As per HPI- otherwise negative. They have noted bilateral LE edema for the last couple of weeks, since he got home    Physical Examination: Vitals:   09/03/18 1044  BP: 122/68  Pulse: (!) 57  Resp: 17  Temp: 98.2 F (36.8 C)  SpO2: 96%   Vitals:   09/03/18 1044  Weight: 211 lb (95.7 kg)  Height: 5\' 9"  (1.753 m)   Body mass index is 31.16 kg/m. Ideal Body Weight: Weight in (lb) to have BMI = 25: 168.9  GEN: WDWN, NAD, Non-toxic, A & O x 3, obese, looks well.  Accompanied today by his wife.  They both contribute to the history HEENT: Atraumatic,  Normocephalic. Neck supple. No masses, No LAD. Ears and Nose: No external deformity. CV: RRR, No M/G/R. No JVD. No thrill. No extra heart sounds. PULM: CTA B, no wheezes, crackles, rhonchi. No retractions. No resp. distress. No accessory muscle use. ABD: S, NT,  ND, +BS. No rebound. No HSM. EXTR: No c/c/minimal soft pitting edema of both lower extremities.  Calves are soft, no cords NEURO sitting in wheelchair today.  He has acceptably normal strength in all extremities.  He is able to get to his feet and stand with a little bit of assistance.  Otherwise did not have him walk as he did not bring his walker PSYCH: Normally interactive. Conversant- mentation is not 100%, he is a bit slow. Not depressed or anxious appearing.  Calm demeanor.  He is wearing adult incontinence underwear.  He has mild erythema and irritation of the inguinal folds The scrotum is enlarged, consistent with?  Hydrocele  Wt Readings from Last 3 Encounters:  09/03/18 211 lb (95.7 kg)  08/22/18 211 lb 3.2 oz (95.8 kg)  08/01/18 214 lb 11.7 oz (97.4 kg)    Assessment and Plan:   ICD-10-CM   1. Leukopenia, unspecified type  D72.819 CBC  2. Hospital discharge follow-up  Z09   3. Hypertension, essential  I10   4. Controlled type 2 diabetes mellitus without complication, without long-term current use of insulin (HCC)  E11.9 Hemoglobin A1c    Microalbumin / creatinine urine ratio  5. Subdural hematoma (HCC)  S06.5X9A   6. Closed fracture of multiple ribs, unspecified laterality, initial encounter  S22.49XA   7. Coronary artery disease involving native heart without angina pectoris, unspecified vessel or lesion type  I25.10   8. Paroxysmal atrial fibrillation (HCC)  I48.0   9. Urinary frequency  R35.0 Urine Culture    CANCELED: Urine Culture   Following up today from complicated recent history- patient suffered a fall and subdural hematoma, resultant complications included atrial fibrillation with RVR. Though he is not  back at baseline, we are glad that Dorinda HillDonald seems to making good progress.  I encouraged him and his wife that we hope he will continue to improve At this time he seems to be in sinus rhythm.  Continue metoprolol and diltiazem.  They are seeing cardiology next week, and will discuss further plans Will check A1c today.  Adjust medication for diabetes as needed Dorinda HillDonald has had scrotal edema and urinary frequency, as well as incontinence since his accident.  He is seeing urology next week, but I will check a urine culture today Went over strategies to protect his perineal skin and help it heal Suspect minimal lower extremity edema is due to decreased physical activity.  Continue home therapy, plan to visit here in 3 months Follow-up: No follow-ups on file.  No orders of the defined types were placed in this encounter.  Orders Placed This Encounter  Procedures  . Urine Culture  . CBC  . Hemoglobin A1c  . Microalbumin / creatinine urine ratio     Signed Abbe AmsterdamJessica Orelia Brandstetter, MD  Received his labs 6/25-  A1c is improved  White cell count is stable, hg stable   Results for orders placed or performed in visit on 09/03/18  CBC  Result Value Ref Range   WBC 3.4 (L) 4.0 - 10.5 K/uL   RBC 4.34 4.22 - 5.81 Mil/uL   Platelets 155.0 150.0 - 400.0 K/uL   Hemoglobin 12.8 (L) 13.0 - 17.0 g/dL   HCT 16.138.2 (L) 09.639.0 - 04.552.0 %   MCV 88.1 78.0 - 100.0 fl   MCHC 33.5 30.0 - 36.0 g/dL   RDW 40.915.2 81.111.5 - 91.415.5 %  Hemoglobin A1c  Result Value Ref Range   Hgb A1c MFr Bld 7.2 (H) 4.6 - 6.5 %

## 2018-09-03 ENCOUNTER — Encounter: Payer: Self-pay | Admitting: Family Medicine

## 2018-09-03 ENCOUNTER — Ambulatory Visit (INDEPENDENT_AMBULATORY_CARE_PROVIDER_SITE_OTHER): Payer: Medicare HMO | Admitting: Family Medicine

## 2018-09-03 ENCOUNTER — Other Ambulatory Visit: Payer: Self-pay

## 2018-09-03 VITALS — BP 122/68 | HR 57 | Temp 98.2°F | Resp 17 | Ht 69.0 in | Wt 211.0 lb

## 2018-09-03 DIAGNOSIS — D72819 Decreased white blood cell count, unspecified: Secondary | ICD-10-CM | POA: Diagnosis not present

## 2018-09-03 DIAGNOSIS — R350 Frequency of micturition: Secondary | ICD-10-CM

## 2018-09-03 DIAGNOSIS — E119 Type 2 diabetes mellitus without complications: Secondary | ICD-10-CM

## 2018-09-03 DIAGNOSIS — Z09 Encounter for follow-up examination after completed treatment for conditions other than malignant neoplasm: Secondary | ICD-10-CM | POA: Diagnosis not present

## 2018-09-03 DIAGNOSIS — S065XAA Traumatic subdural hemorrhage with loss of consciousness status unknown, initial encounter: Secondary | ICD-10-CM

## 2018-09-03 DIAGNOSIS — I2510 Atherosclerotic heart disease of native coronary artery without angina pectoris: Secondary | ICD-10-CM

## 2018-09-03 DIAGNOSIS — I10 Essential (primary) hypertension: Secondary | ICD-10-CM | POA: Diagnosis not present

## 2018-09-03 DIAGNOSIS — I480 Paroxysmal atrial fibrillation: Secondary | ICD-10-CM

## 2018-09-03 DIAGNOSIS — S2249XA Multiple fractures of ribs, unspecified side, initial encounter for closed fracture: Secondary | ICD-10-CM

## 2018-09-03 DIAGNOSIS — S065X9A Traumatic subdural hemorrhage with loss of consciousness of unspecified duration, initial encounter: Secondary | ICD-10-CM

## 2018-09-03 LAB — CBC
HCT: 38.2 % — ABNORMAL LOW (ref 39.0–52.0)
Hemoglobin: 12.8 g/dL — ABNORMAL LOW (ref 13.0–17.0)
MCHC: 33.5 g/dL (ref 30.0–36.0)
MCV: 88.1 fl (ref 78.0–100.0)
Platelets: 155 10*3/uL (ref 150.0–400.0)
RBC: 4.34 Mil/uL (ref 4.22–5.81)
RDW: 15.2 % (ref 11.5–15.5)
WBC: 3.4 10*3/uL — ABNORMAL LOW (ref 4.0–10.5)

## 2018-09-03 LAB — HEMOGLOBIN A1C: Hgb A1c MFr Bld: 7.2 % — ABNORMAL HIGH (ref 4.6–6.5)

## 2018-09-03 NOTE — Patient Instructions (Addendum)
It was good to see you today- it seems like you are making wonderful progress!   I will be in touch with your labs from today asap Please bring [place] a urine sample from home at your convenience and we will send it for a culture   Try using a desitin type diaper cream, powder and also topical anti-fungal/ jock itch cream for your groin rash   We will adjust your diabetes regimen if need be I will watch for notes from your urologist and other doctors Let me know if you have any concerns and take care!  Let's plan to visit in 3 months

## 2018-09-04 ENCOUNTER — Encounter: Payer: Medicare HMO | Attending: Physical Medicine & Rehabilitation | Admitting: Physical Medicine & Rehabilitation

## 2018-09-04 ENCOUNTER — Encounter: Payer: Self-pay | Admitting: Physical Medicine & Rehabilitation

## 2018-09-04 VITALS — BP 99/61 | HR 51 | Temp 97.9°F | Ht 69.0 in | Wt 215.2 lb

## 2018-09-04 DIAGNOSIS — G8191 Hemiplegia, unspecified affecting right dominant side: Secondary | ICD-10-CM | POA: Diagnosis present

## 2018-09-04 DIAGNOSIS — S069X0S Unspecified intracranial injury without loss of consciousness, sequela: Secondary | ICD-10-CM

## 2018-09-04 DIAGNOSIS — F068 Other specified mental disorders due to known physiological condition: Secondary | ICD-10-CM | POA: Diagnosis present

## 2018-09-04 DIAGNOSIS — S065XAA Traumatic subdural hemorrhage with loss of consciousness status unknown, initial encounter: Secondary | ICD-10-CM

## 2018-09-04 DIAGNOSIS — S065X9A Traumatic subdural hemorrhage with loss of consciousness of unspecified duration, initial encounter: Secondary | ICD-10-CM | POA: Diagnosis present

## 2018-09-04 NOTE — Progress Notes (Signed)
Subjective:    Patient ID: [name] Frank, male    DOB: 1950/06/09, 68 y.o.   MRN: [id number]  HPI  68 year old male with prior history of diabetes hypertension who fell on steps on five 06/2018 with traumatic brain injury subdural hemorrhage and rib fractures.  During his hospitalization was diagnosed with new onset atrial fibrillation with rapid ventricular response and placed on low-dose aspirin.  He was discharged on 512/2020 but readmitted on 07/28/2018 with lethargy and acute onset of right facial droop and slurred speech.  He was hypotensive had renal failure a CT showed a stable left frontal parietal subarachnoid hemorrhage as well as right occipital subarachnoid hemorrhage but some concerns about developing communicating hydrocephalus.  He underwent MRI scanning demonstrating ventriculomegaly with stable left subdural hematoma.  Aspirin was discontinued.  Neurosurgery evaluation did not recommend any intervention.  Patient was felt to have mental status changes related to seizure disorder.  Loaded with Keppra.  Treated with antibiotics for hydrocele, Augmentin for 2 weeks  On second week HHPT, OT, SLP Still needs help with shoes and socks, balance is poor.  Fell off toilet, and slid off [place] boy No seizure activity at home  Urgency with bowel and bladder with 2 episode of incont   Pain Inventory Average Pain 7 Pain Right Now 0 My pain is stabbing  In the last 24 hours, has pain interfered with the following? General activity 0 Relation with others 0 Enjoyment of life 0 What TIME of day is your pain at its worst? varies Sleep (in general) Fair  Pain is worse with: bending and sudden movements Pain improves with: rest Relief from Meds: na  Mobility walk with assistance use a walker how many minutes can you walk? 3-4 ability to climb steps?  yes do you drive?  no use a wheelchair needs help with transfers  Function retired I need assistance with the following:  feeding,  dressing, bathing, toileting, meal prep, household duties and shopping  Neuro/Psych bladder control problems weakness trouble walking confusion  Prior Studies Any changes since last visit?  no  Physicians involved in your care Primary care [place] Neurologist Oostergaard   Family History  Problem Relation Age of Onset  . Stroke Father   . Hypertension Father   . Hyperlipidemia Father   . Early death Mother   . Colitis Sister   . Appendicitis Brother    Social History   Socioeconomic History  . Marital status: Married    Spouse name: Not on file  . Number of children: Not on file  . Years of education: Not on file  . Highest education level: Not on file  Occupational History  . Not on file  Social Needs  . Financial resource strain: Not on file  . Food insecurity    Worry: Not on file    Inability: Not on file  . Transportation needs    Medical: Not on file    Non-medical: Not on file  Tobacco Use  . Smoking status: Never Smoker  . Smokeless tobacco: Never Used  Substance and Sexual Activity  . Alcohol use: Yes    Comment: VERY RARELY  . Drug use: No  . Sexual activity: Not on file  Lifestyle  . Physical activity    Days per week: Not on file    Minutes per session: Not on file  . Stress: Not on file  Relationships  . Social connections    Talks on phone: Not on file  Gets together: Not on file    Attends religious service: Not on file    Active member of club or organization: Not on file    Attends meetings of clubs or organizations: Not on file    Relationship status: Not on file  Other Topics Concern  . Not on file  Social History Narrative  . Not on file   Past Surgical History:  Procedure Laterality Date  . catarracts    . OTHER SURGICAL HISTORY  2016   HEART STENT  . TONSILLECTOMY     Past Medical History:  Diagnosis Date  . Diabetes mellitus   . Heart disease   . Hyperlipemia   . Hypertension    BP 99/61   Pulse (!) 51    Temp 97.9 F (36.6 C)   Ht 5\' 9"  (1.753 m)   Wt 215 lb 3.2 oz (97.6 kg)   SpO2 95%   BMI 31.78 kg/m   Opioid Risk Score:   Fall Risk Score:  `1  Depression screen PHQ 2/9  Depression screen PHQ 2/9 01/16/2018  Decreased Interest 0  Down, Depressed, Hopeless 0  PHQ - 2 Score 0   Review of Systems  Constitutional: Negative.   HENT: Negative.   Eyes: Negative.   Respiratory: Negative.   Cardiovascular: Negative.   Gastrointestinal: Positive for constipation.  Endocrine: Negative.   Genitourinary: Negative.        Urine retention  Musculoskeletal: Positive for back pain and gait problem.  Skin: Negative.   Allergic/Immunologic: Negative.   Neurological: Positive for weakness.  Hematological: Negative.   Psychiatric/Behavioral: Positive for confusion.  All other systems reviewed and are negative.      Objective:   Physical Exam Vitals signs and nursing note reviewed.  Constitutional:      Appearance: Normal appearance.  HENT:     Head: Normocephalic and atraumatic.  Skin:    General: Skin is warm and dry.  Neurological:     Mental Status: He is alert.   Speech without dysarthria or aphasia.  Responses are significantly delayed Immediate recall 3/3 Delayed recall 1/3 2 min  Motor strength is 4/5 in the right deltoid bicep tricep grip hip flexor knee extensor ankle dorsiflexor 5/5 in the left deltoid bicep tricep grip hip flexor knee extensor ankle dorsiflexor Sensation is reported is equal to light touch bilateral upper and lower limbs There is no evidence of facial droop Gait not tested he did not have his walker.  He does require standby assistance for sit to stand.  He needs cues to lock his wheelchair.  He stands up very slowly and maintains balance with a wide base gait with a tendency towards posterior lean. No evidence of cogwheeling, tone is normal       Assessment & Plan:  1.  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  Has Seen Dr Venetia Constable.  Wife is not aware of any follow-up visit.  We will need to get neurology as well as neurosurgery input in terms of duration of antiepileptic therapy  Will be seeing Neuro and Urology next week Follow-up with primary care  We will see the patient back in 6 to 8 weeks at that point he may be ready to transition to an outpatient therapy program.

## 2018-09-11 ENCOUNTER — Telehealth: Payer: Self-pay

## 2018-09-11 NOTE — Telephone Encounter (Signed)
Copied from [place] [id number]. Topic: General - Other >> Sep 11, 2018  9:52 AM Jodie Echevaria wrote: Reason for CRM: Patient daughter Meredith Mody called to say that they have been trying to get some help for the patient. But per [facility] they need a Plan of care faxed to [facility] from Dr Lorelei Pont. She states that they are willing to work with Alvis Lemmings who does PT, OT, Speech and someone that some in and help him shower one hour a day. Please contact Wade at Ph# [phone]

## 2018-09-11 NOTE — Telephone Encounter (Signed)
Called [name] Frank back- they want to try and get some more assistance at home through [facility]  They can get up to about 35 hours of help at home with a "plan of care" from me. I am glad to help but am not sure what the plan of care is exactly. [name] Frank will call [employer] back and ask them to fax me whatever forms/ info they might need and I will fill it out

## 2018-09-15 NOTE — Telephone Encounter (Signed)
Fax # [phone]

## 2018-09-15 NOTE — Telephone Encounter (Signed)
[name] Frank, from [place], called stating she sent over fax for pts plan of care on 09/12/2018. [name] Frank states she will send that over again on 09/15/2018. Callback # [phone]

## 2018-09-15 NOTE — Telephone Encounter (Signed)
Have you seen this ?

## 2018-09-18 NOTE — Telephone Encounter (Signed)
Forms have been filled out by provider and faxed sent to scan.

## 2018-09-18 NOTE — Telephone Encounter (Signed)
[facility] will refax the forms again

## 2018-09-22 ENCOUNTER — Other Ambulatory Visit: Payer: Self-pay | Admitting: Urology

## 2018-09-22 NOTE — Telephone Encounter (Signed)
Daughter Meredith Mody called, she says that [facility] is telling her that they do not have the forms.  They have told the daughter to have the office refax the forms to [place] number is [phone] Phone for [employer] is - [phone] Thanks

## 2018-09-22 NOTE — Telephone Encounter (Signed)
Called below #. CSR states claim has been approved from 08/24/18 through 10/23/18 for [id number]. Ref# [id number]. He is unable to tell me if they have received the plan of care that we faxed this morning.

## 2018-09-22 NOTE — Telephone Encounter (Signed)
Home Health Certification and Plan of Care from 09/03/18 printed and faxed to [facility] at below #. Notified pt's daughter and she states that [facility] said we need to call their Clinical Intake team once we fax the form at [phone].

## 2018-09-23 ENCOUNTER — Telehealth: Payer: Self-pay | Admitting: Family Medicine

## 2018-09-23 NOTE — Telephone Encounter (Signed)
Caller name: Roselie Awkward Relation to pt: PT with [facility]  Call back number: [phone]    Reason for call:  PT wanted to inform PCP patint had a fall this morning patient did not hit his head but did hit his shoulder and elbow on the refrigerator, elbow area is red. Patient continues to experience unsteady balance gait while walking.

## 2018-09-23 NOTE — Telephone Encounter (Signed)
Roselie Awkward called me back and we talked.  He is concerned that pt is unsteady due to his TBI- they are working on this in PT, Roselie Awkward has discussed this with pt and his wife.  We are not sure how much better he will get

## 2018-09-23 NOTE — Telephone Encounter (Signed)
Called Roselie Awkward back and left message on his machine.  Provide my cell phone number, please call if anything is needed for me

## 2018-09-29 ENCOUNTER — Ambulatory Visit: Payer: Medicare HMO | Admitting: Psychology

## 2018-09-29 ENCOUNTER — Ambulatory Visit: Payer: Self-pay | Admitting: Family Medicine

## 2018-09-29 NOTE — Telephone Encounter (Signed)
Pt's wife calling, reports pt fell in BR, unwitnessed fall.  States occurred 30 minutes ago. Reports pt with abrasion size of nickel and lump size of small golf ball. Pt with H/O TBI.  Wife states cognition at baseline, states speech clear, "Acting normal."  Wife '[name] Frank' then said she was getting an important call she had to take, I said I would hold but call disconnected.   Attempted to CB, left message on VM to CB for further assessement.  Pts CB [phone]  Answer Assessment - Initial Assessment Questions 1. MECHANISM: "How did the injury happen?" For falls, ask: "What height did you fall from?" and "What surface did you fall against?"      Not sure, fell in BR 2. ONSET: "When did the injury happen?" (Minutes or hours ago)      30 minutes ago 3. NEUROLOGIC SYMPTOMS: "Was there any loss of consciousness?" "Are there any other neurological symptoms?"     No Pt is TBI but at baseline 4. MENTAL STATUS: "Does the person know who he is, who you are, and where he is?"     Yes 5. LOCATION: "What part of the head was hit?"      *No Answer* 6. SCALP APPEARANCE: "What does the scalp look like? Is it bleeding now?" If so, ask: "Is it difficult to stop?"     Abrasion size of nickel. Lump size of small golf ball 7. SIZE: For cuts, bruises, or swelling, ask: "How large is it?" (e.g., inches or centimeters)      Abrasion size of nickel 8. PAIN: "Is there any pain?" If so, ask: "How bad is it?"  (e.g., Scale 1-10; or mild, moderate, severe)      9. TETANUS: For any breaks in the skin, ask: "When was the last tetanus booster?"     no 10. OTHER SYMPTOMS: "Do you have any other symptoms?" (e.g., neck pain, vomiting)       no  Protocols used: HEAD INJURY-A-AH

## 2018-09-30 NOTE — Telephone Encounter (Signed)
Called back and talked with [place].  [name] Frank fell yesterday but seems to be ok, no change, no significant injury.  She is comfortable with seeing me tomorrow for our planned appt  They will see me tomorrow - can order repeat CT if indicated

## 2018-09-30 NOTE — Telephone Encounter (Signed)
Patient coming in tomorrow for follow up on previous fall. Patient fell again last night. Please advise on if he needs to be seen sooner?

## 2018-10-01 ENCOUNTER — Ambulatory Visit (HOSPITAL_BASED_OUTPATIENT_CLINIC_OR_DEPARTMENT_OTHER)
Admission: RE | Admit: 2018-10-01 | Discharge: 2018-10-01 | Disposition: A | Discharge status: Home / Self Care | Payer: Medicare HMO | Source: Ambulatory Visit | Attending: Family Medicine | Admitting: Family Medicine

## 2018-10-01 ENCOUNTER — Ambulatory Visit (INDEPENDENT_AMBULATORY_CARE_PROVIDER_SITE_OTHER): Payer: Medicare HMO | Admitting: Family Medicine

## 2018-10-01 ENCOUNTER — Encounter: Payer: Self-pay | Admitting: Family Medicine

## 2018-10-01 ENCOUNTER — Other Ambulatory Visit: Payer: Self-pay

## 2018-10-01 VITALS — BP 104/60 | HR 65 | Temp 97.7°F | Resp 16 | Ht 69.0 in | Wt 215.0 lb

## 2018-10-01 DIAGNOSIS — R269 Unspecified abnormalities of gait and mobility: Secondary | ICD-10-CM | POA: Diagnosis not present

## 2018-10-01 DIAGNOSIS — R5381 Other malaise: Secondary | ICD-10-CM

## 2018-10-01 DIAGNOSIS — I10 Essential (primary) hypertension: Secondary | ICD-10-CM

## 2018-10-01 DIAGNOSIS — S065XAA Traumatic subdural hemorrhage with loss of consciousness status unknown, initial encounter: Secondary | ICD-10-CM

## 2018-10-01 DIAGNOSIS — E119 Type 2 diabetes mellitus without complications: Secondary | ICD-10-CM

## 2018-10-01 DIAGNOSIS — S065X9A Traumatic subdural hemorrhage with loss of consciousness of unspecified duration, initial encounter: Secondary | ICD-10-CM

## 2018-10-01 IMAGING — CT CT HEAD WITHOUT CONTRAST
4 series · 16 of 47 positions shown, 18 images · non-contrast
Comparison: Multiple prior head CTs, most recently [DATE]

CLINICAL DATA: Subdural hemorrhage with recent fall

EXAM:
CT HEAD WITHOUT CONTRAST
TECHNIQUE: Contiguous axial images were obtained from the base of the skull
through the vertex without intravenous contrast.

[Series 2: head wo · axial · 0.43mm/px · z∈[-172,-52]mm · 7 of 34 slices shown, 9 images]
[im 5/34  brain]
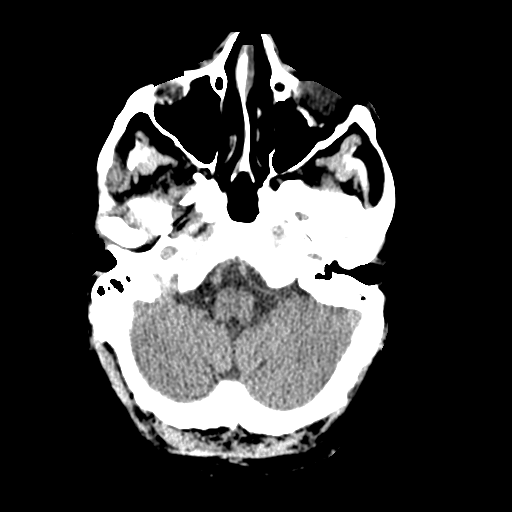
[im 5/34  bone]
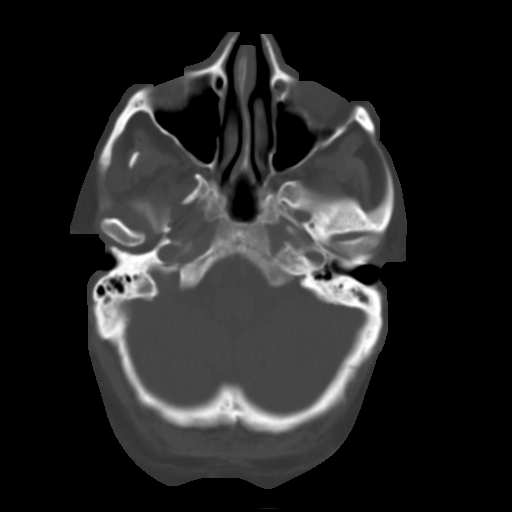
[im 9/34  brain]
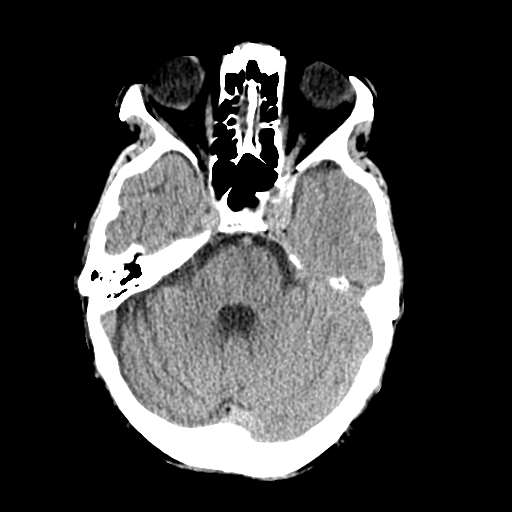
[im 13/34  brain]
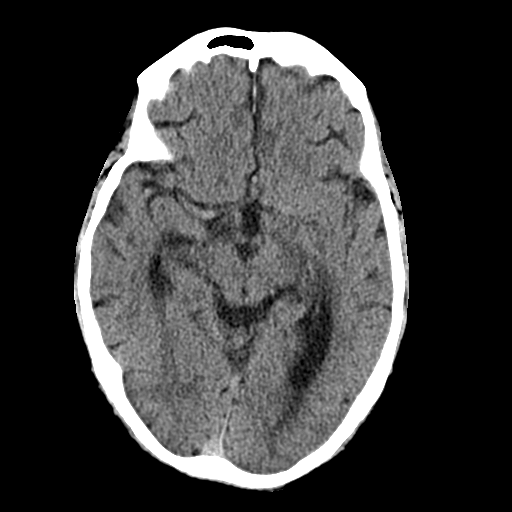
[im 17/34  brain]
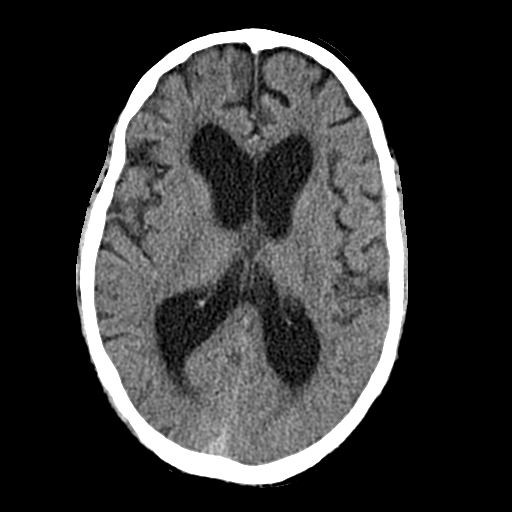
[im 21/34  brain]
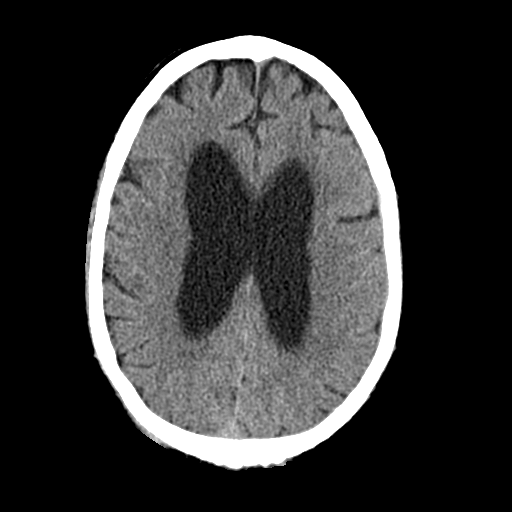
[im 21/34  bone]
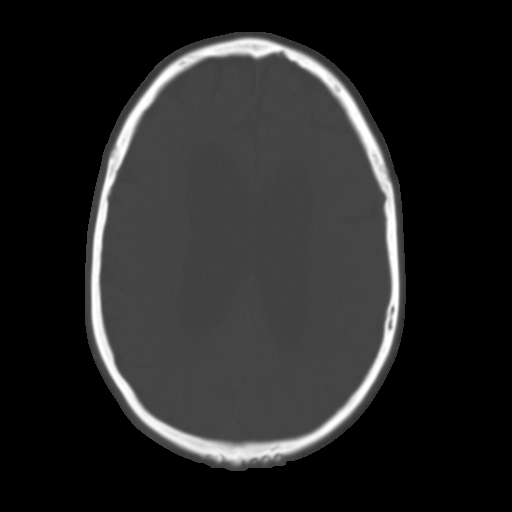
[im 25/34  brain]
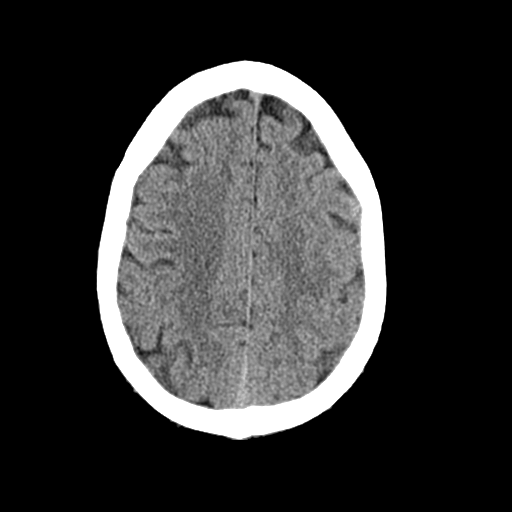
[im 29/34  brain]
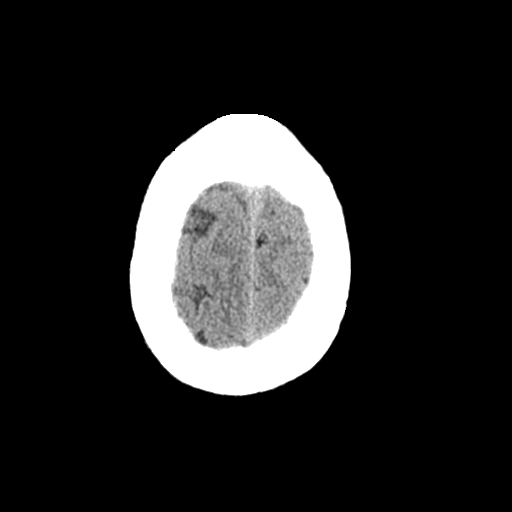

[Series 3: head bone · axial · 0.43mm/px · z∈[-176,-142]mm · 3 of 85 slices shown]
[im 9/85  bone]
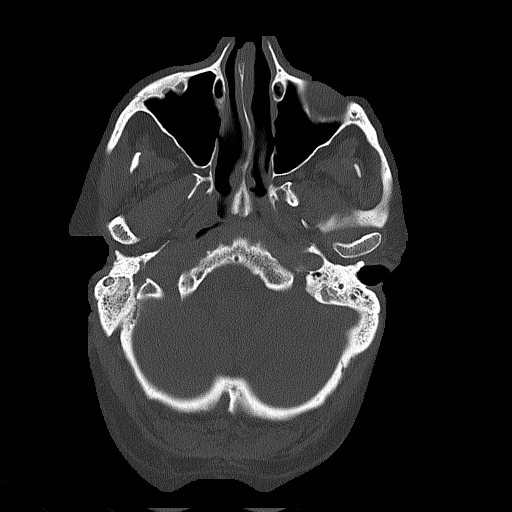
[im 17/85  bone]
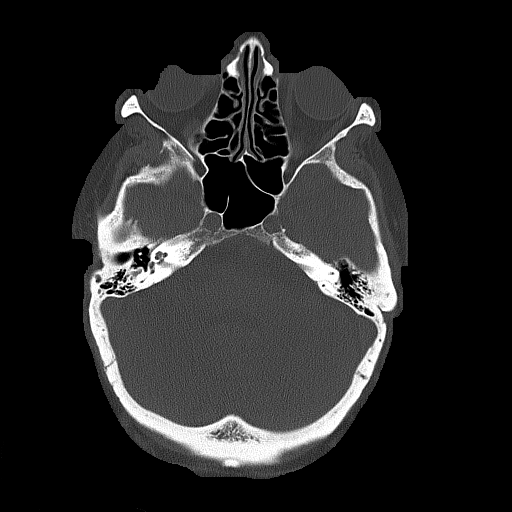
[im 26/85  bone]
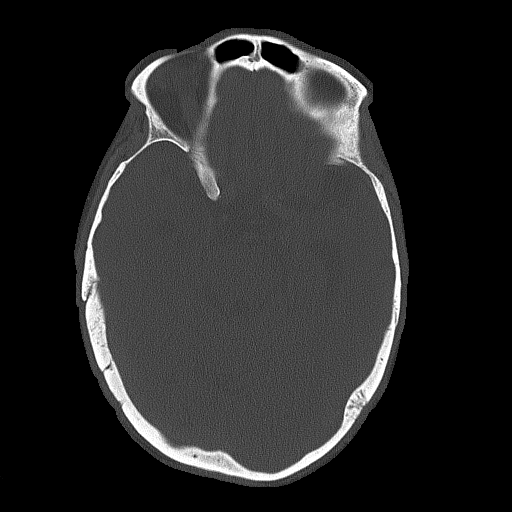

[Series 4: cor soft · coronal · 0.35mm/px · 3 of 71 slices shown]
[im 24/71  brain]
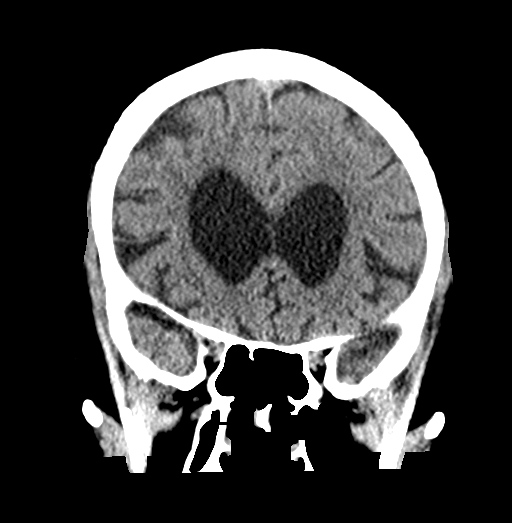
[im 32/71  brain]
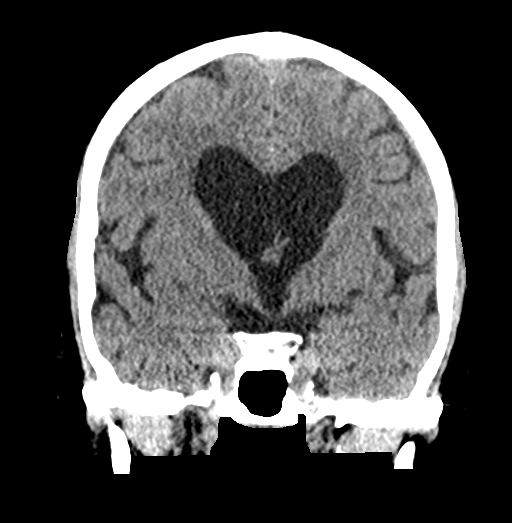
[im 39/71  brain]
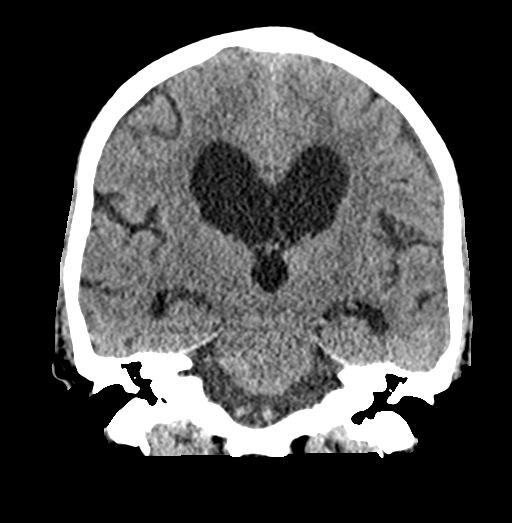

[Series 5: sag soft · sagittal · 0.36mm/px · 3 of 57 slices shown]
[im 19/57  brain]
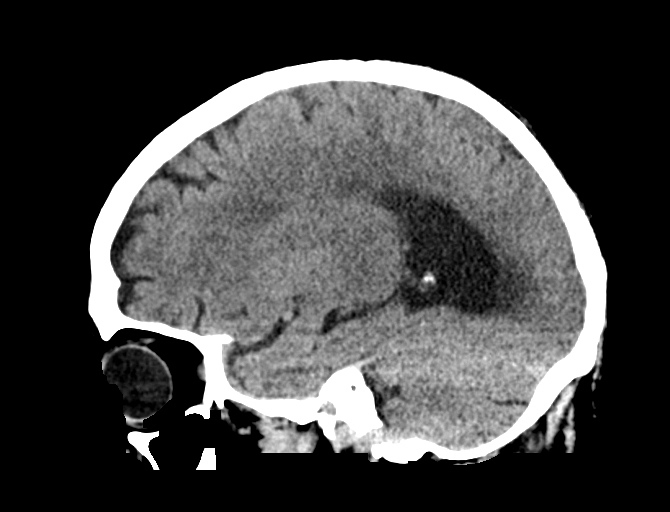
[im 29/57  brain]
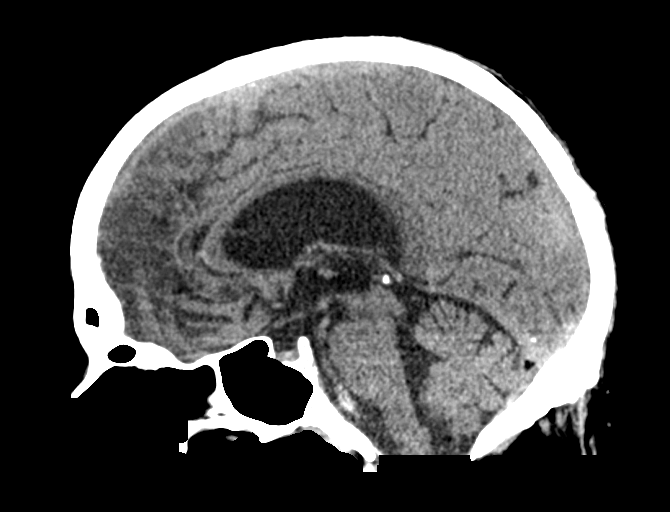
[im 38/57  brain]
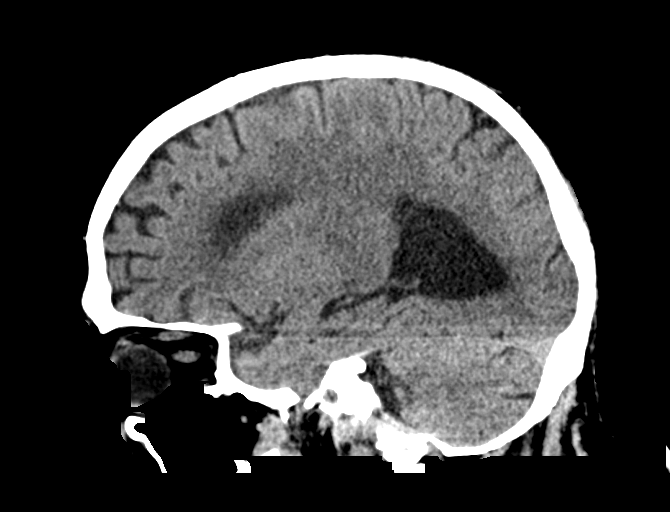

[16 of 47 positions shown; findings below may reference images not displayed]

FINDINGS: Brain: Continued interval decrease in the size of a left subdural
collection now measuring up to 3 mm over the parietal convexity
(previously 4 mm) and 2-3 mm over the left occipital lobe
(previously 7 mm). No evidence of interval bleeding or new/acute
blood products. No midline shift.

Unchanged mild ventriculomegaly with dilation of the temporal horns
of the bilateral lateral ventricles, not significantly changed from
prior. No evidence of acute infarction. No evidence of mass.

Vascular: No unexpected hyperdense vessel.

Skull: Normal. Negative for fracture or focal lesion.

Sinuses/Orbits: No acute finding.

Other: None
IMPRESSION: 1. Continued interval decrease in size of left subdural collection,
now measuring up to 3 mm, previously 4 mm. No evidence for interval
bleeding.
2. Persistent ventriculomegaly, similar to prior.
3. No new acute intracranial process.

## 2018-10-01 NOTE — Progress Notes (Signed)
[facility] [address], [address] [place], [place] [address] [phone] [phone]  Date:  10/01/2018   Name:  [name] Frank   DOB:  Oct 02, 1950   MRN:  [id number]  PCP:  [name] [place],  C, Frank    Chief Complaint: Fall (abradion on head, bump) and Fatigue (trouble staying focused, loss of balance-how long will this take? )   History of Present Illness:  [name] Frank is a 68 y.o. very pleasant male patient who presents with the following:  In office follow-up today.  Last seen by myself about one month ago  History of DM, CAD, renal insuf, and subdural hematoma/hydrocephalus/TBI sustained when he fell this past May  Seen most recently by myself about 4 weeks ago [name] Frank had a fall at home on May 4-he unfortunately sustained a traumatic brain injury/subdural hematoma and rib fractures.  He also developed atrial flutter with RVR during that admission.  He was discharged home 5/12, but then readmitted from May 18- 22nd with mental status change.  This was thought to be focal seizure activity due to hematoma breakdown products. After hospital discharge in May 22, he was admitted to rehab until June 12.  He then came home where he lives wit his wife who is also present today  Neurosurgeon Dr. Maurice Frank Cardiologist [facility]. [facility] Dr. Wynn Frank Urologist Dr. Loleta Frank was also diagnosed with large bilateral hydroceles.  Per most recent urology note, at this point they plan to operate next month He is able to pass urine but his aim is poor   ?Who is his neurologist - he does not have one yet.  I will follow up on this referral  The patient fell at home on 7/20, did not seem to be seriously injured but did hit his head on the floor We can certainly get a CT of his head today Most recent CT head on 6/11 He feels ok since his fall notes that his head does not really hurt   Their daughter lives in [place]  and is helping some, but she does not live here permanently Their son is not local He has had several falls recently, both from standing and sliding out of chairs, etc  He is getting PT and OT at home, speech His swallowing is ok and can drink regular liquids  He has lost some weight but we are not sure how much - he cannot easily stand on our scale  We note that his BP is low today  Today during our interview his wife [name] Frank started crying and was really inconsolable for some time. She states that she is not able to take care of [name] Frank at home, and that he may have mis-handled finances over the last several years causing them to lose their savings.  They will have to sell their home, and she cannot afford help at home for [place]   BP Readings from Last 3 Encounters:  10/01/18 104/60  09/04/18 99/61  09/03/18 122/68    Lab Results  Component Value Date   HGBA1C 7.2 (H) 09/03/2018     Patient Active Problem List   Diagnosis Date Noted  . Right hemiparesis (HCC) 09/04/2018  . Cognitive deficit as late effect of traumatic brain injury (HCC) 09/04/2018  . Bradycardia   . Leukopenia   . Hypoalbuminemia due to protein-calorie malnutrition (HCC)   . Hydrocele   . Hydrocephalus (HCC) 07/28/2018  .  Mild renal insufficiency 07/28/2018  . Paroxysmal atrial fibrillation ([place]) 07/28/2018  . Subdural hematoma ([place]) 07/14/2018  . Hypertension, essential 01/16/2018  . Controlled type 2 diabetes mellitus without complication, without long-term current use of insulin ([place]) 01/16/2018  . Coronary artery disease involving native heart without angina pectoris 01/16/2018    Past Medical History:  Diagnosis Date  . Diabetes mellitus   . Heart disease   . Hyperlipemia   . Hypertension     Past Surgical History:  Procedure Laterality Date  . catarracts    . OTHER SURGICAL HISTORY  2016   HEART STENT  . TONSILLECTOMY      Social History   Tobacco Use  . Smoking status: Never Smoker   . Smokeless tobacco: Never Used  Substance Use Topics  . Alcohol use: Yes    Comment: VERY RARELY  . Drug use: No    Family History  Problem Relation Age of Onset  . Stroke Father   . Hypertension Father   . Hyperlipidemia Father   . Early death Mother   . Colitis Sister   . Appendicitis Brother     No Known Allergies  Medication list has been reviewed and updated.  Current Outpatient Medications on File Prior to Visit  Medication Sig Dispense Refill  . acetaminophen (TYLENOL) 325 MG tablet Take 2 tablets (650 mg total) by mouth every 6 (six) hours as needed for mild pain.    Marland Kitchen atorvastatin (LIPITOR) 80 MG tablet Take 1 tablet (80 mg total) by mouth daily. 30 tablet 1  . docusate sodium (COLACE) 100 MG capsule Take 1 capsule (100 mg total) by mouth 2 (two) times daily. For constipation 60 capsule 0  . glipiZIDE (GLUCOTROL XL) 10 MG 24 hr tablet Take 1 tablet (10 mg total) by mouth 2 (two) times daily. 60 tablet 1  . levETIRAcetam (KEPPRA) 750 MG tablet Take 1 tablet (750 mg total) by mouth 2 (two) times daily. 60 tablet 1  . methylphenidate (RITALIN) 10 MG tablet Take 1 tablet (10 mg total) by mouth 2 (two) times daily with breakfast and lunch. 60 tablet 0  . metoprolol succinate (TOPROL-XL) 100 MG 24 hr tablet Take 1 tablet (100 mg total) by mouth daily. Take with or immediately following a meal. 30 tablet 0  . tamsulosin (FLOMAX) 0.4 MG CAPS capsule Take 1 capsule (0.4 mg total) by mouth daily after supper. 30 capsule 1   No current facility-administered medications on file prior to visit.     Review of Systems:  As per HPI- otherwise negative. No CP or SOB  Physical Examination: Vitals:   10/01/18 1028  BP: 104/60  Pulse: 65  Resp: 16  Temp: 97.7 F (36.5 C)  SpO2: 97%   Vitals:   10/01/18 1028  Weight: 215 lb (97.5 kg)  Height: 5\' 9"  (1.753 m)   Body mass index is 31.75 kg/m. Ideal Body Weight: Weight in (lb) to have BMI = 25: 168.9  GEN: WDWN, NAD,  Non-toxic, A & O x 3, obese, appears at his baseline Small contusion on back of his head  HEENT: Atraumatic, Normocephalic. Neck supple. No masses, No LAD. Ears and Nose: No external deformity. CV: RRR- SR during our visit, No M/G/R. No JVD. No thrill. No extra heart sounds. PULM: CTA B, no wheezes, crackles, rhonchi. No retractions. No resp. distress. No accessory muscle use. EXTR: No c/c/e NEURO sitting in WC.  He is able to stand and take a couple of shuffling small steps  with close assist.  Not able to walk on his own.  Not able to transfer on his own.  UE strength is 3-4/5  PSYCH: Normally interactive for pt, quiet affect.  He is calm and cooperative.  He is not able to complete complex thought processes     Assessment and Plan:   ICD-10-CM   1. Subdural hematoma (HCC)  S06.5X9A CT Head Wo Contrast  2. Physical debility  R53.81   3. Gait disturbance  R26.9   4. Controlled type 2 diabetes mellitus without complication, without long-term current use of insulin (HCC)  E11.9    1 month follow-up visit today [name] Frank is not making a lot of progress unfortunately, and his wife does not think she can manage at home.  She did calm down after we talked and felt safe to drive home  He did have another fall and hit his head so will obtain CT scan today, I will work on a plan to help them   Per recent cardiology note he is supposed to be on just metoprolol, but I think he is still taking diltiazem as well. They report he has been out of "all his med" for 2 days awaiting mail order delivery  I was able to contact a provider at his cardiology office.  They confirm that he is not supposed be taking diltiazem right now.  He is currently on an event monitor, they suggested that I hold his metoprolol for the time being due to low blood pressure  I also discussed his case with Dr.Kirsteins who is his physical medicine and rehab provider Per his knowledge, getting outpatient back into inpatient rehab after  he been discharged is not typically possible.  He suggested that we look into skilled nursing placement  Called [facility] and LMOM with new pt coordinator that this pt needs to establish care  Called back and discussed with his wife.  D/C dilt,Hold toprol for now.  She reports that [name] Frank is actually refusing to wear his event monitor at this time. She will call and discuss with his cardiologist tomorrow We discussed SNF placement, I encouraged her to contact "[facility]" to get help in finding a facility and discussing finances Let her know that CT is negative for any new bleed   Will check in with them next week  Well over 60 minutes spent in pt care and coordination today. More than 50% face to face counseling    Follow-up: No follow-ups on file.  No orders of the defined types were placed in this encounter.  Orders Placed This Encounter  Procedures  . CT Head Wo Contrast      Received his CT Ct Head Wo Contrast  Result Date: 10/01/2018 CLINICAL DATA:  Subdural hemorrhage with recent fall EXAM: CT HEAD WITHOUT CONTRAST TECHNIQUE: Contiguous axial images were obtained from the base of the skull through the vertex without intravenous contrast. COMPARISON:  Multiple prior head CTs, most recently 09/20/2018 FINDINGS: Brain: Continued interval decrease in the size of a left subdural collection now measuring up to 3 mm over the parietal convexity (previously 4 mm) and 2-3 mm over the left occipital lobe (previously 7 mm). No evidence of interval bleeding or new/acute blood products. No midline shift. Unchanged mild ventriculomegaly with dilation of the temporal horns of the bilateral lateral ventricles, not significantly changed from prior. No evidence of acute infarction. No evidence of mass. Vascular: No unexpected hyperdense vessel. Skull: Normal. Negative for fracture or focal lesion. Sinuses/Orbits:  No acute finding. Other: None IMPRESSION: 1. Continued interval decrease in size of left  subdural collection, now measuring up to 3 mm, previously 4 mm. No evidence for interval bleeding. 2. Persistent ventriculomegaly, similar to prior. 3. No new acute intracranial process. Electronically Signed   By: Duanne GuessNicholas  Plundo M.D.   On: 10/01/2018 12:05

## 2018-10-01 NOTE — Patient Instructions (Signed)
Please proceed to the ground floor imaging dept to have a CT of your head.  I will call with this report later today We are going to get you guys some more help- if possible I think having Harun go back to rehab for a few weeks may be helpful.  I will work on this and will do everything in my power to help

## 2018-10-02 ENCOUNTER — Telehealth: Payer: Self-pay

## 2018-10-02 NOTE — Telephone Encounter (Signed)
[name] Frank, she verbalized understanding and states she will call "[facility]"

## 2018-10-02 NOTE — Telephone Encounter (Signed)
Please give [name] a call back.  As it turns out, unfortunately we cannot put [name] back into inpatient rehab once he is discharged home unless there is a major change in his condition. (I had told her this yesterday but maybe she did not understand or remember) At this time a skilled nursing facility is likely their best option for placement. I had suggested the [place] call "[facility]" which is a service that will help people find a SNF  Thanks Mel Almond

## 2018-10-02 NOTE — Telephone Encounter (Signed)
Copied from [place] [id number]. Topic: General - Other >> Oct 01, 2018  4:25 PM Leward Quan A wrote: Reason for CRM: Patient wife [name] called as a reminder to Dr Lorelei Pont regarding the rehab facility she was looking into for the patient. Asking for a call back as soon as there is any available information. Can be reached at Ph#  [phone]

## 2018-10-03 ENCOUNTER — Ambulatory Visit (INDEPENDENT_AMBULATORY_CARE_PROVIDER_SITE_OTHER): Payer: Medicare HMO | Admitting: Psychology

## 2018-10-03 DIAGNOSIS — F4323 Adjustment disorder with mixed anxiety and depressed mood: Secondary | ICD-10-CM

## 2018-10-07 ENCOUNTER — Ambulatory Visit (INDEPENDENT_AMBULATORY_CARE_PROVIDER_SITE_OTHER): Payer: Medicare HMO | Admitting: Psychology

## 2018-10-07 DIAGNOSIS — F4323 Adjustment disorder with mixed anxiety and depressed mood: Secondary | ICD-10-CM | POA: Diagnosis not present

## 2018-10-09 ENCOUNTER — Ambulatory Visit (INDEPENDENT_AMBULATORY_CARE_PROVIDER_SITE_OTHER): Payer: Medicare HMO | Admitting: Neurology

## 2018-10-09 ENCOUNTER — Encounter: Payer: Self-pay | Admitting: Neurology

## 2018-10-09 ENCOUNTER — Other Ambulatory Visit: Payer: Self-pay

## 2018-10-09 ENCOUNTER — Telehealth: Payer: Self-pay | Admitting: Family Medicine

## 2018-10-09 VITALS — BP 109/60 | HR 64 | Temp 98.2°F

## 2018-10-09 DIAGNOSIS — R413 Other amnesia: Secondary | ICD-10-CM

## 2018-10-09 DIAGNOSIS — G919 Hydrocephalus, unspecified: Secondary | ICD-10-CM

## 2018-10-09 MED ORDER — CARBIDOPA-LEVODOPA 25-100 MG PO TABS: 1.0000 | ORAL_TABLET | Freq: Three times a day (TID) | ORAL | 6 refills | Status: DC

## 2018-10-09 MED ORDER — LEVETIRACETAM 500 MG PO TABS: 500.0000 mg | ORAL_TABLET | Freq: Two times a day (BID) | ORAL | 11 refills | Status: DC

## 2018-10-09 NOTE — Telephone Encounter (Signed)
-----   Message from Darreld Mclean, MD sent at 10/01/2018  8:40 PM EDT ----- Check on them next week

## 2018-10-09 NOTE — Telephone Encounter (Signed)
Called [facility] cell -no answer and could not LMOM Called home number and LMOM that I had called, ?can I do anything to help

## 2018-10-09 NOTE — Progress Notes (Signed)
PATIENT: [name] Frank DOB: [birth year]  Chief Complaint  Patient presents with  . Poosible Seizure/Subdural Hematoma    He is here with his wife, [name] Frank.  Reports having a passing out event on 07/13/2018 that caused him to fall.  He suffered a subdural hematoma as a result.  His wife found him in the floor staring, breathing heavily and unable to communicate.         Marland Kitchen PCP    Copland, Gay Filler, MD  . Medication Refill     HISTORICAL  [name] Frank is a 68 year old male, seen in request by his primary care physician [name] Frank for evaluation of possible seizure, history of subdural hematoma, initial evaluation was on October 09, 2018.  I have reviewed and summarized the referring note from the referring physician.  He had a past medical history of hypertension, type 2 diabetes, coronary artery disease, paroxysmal atrial fibrillation, he had an unwitnessed fall at the bottom of the stairs, was brought to the emergency room on Jul 14, 2018, I personally reviewed CT head, acute subdural hematoma along the left cerebral convexity measuring up to 11 mm in thickness, patchy subarachnoid hemorrhage with a traumatic pattern, negative for cervical spine fracture.  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  He readmitted to the hospital on Jul 28, 2018 for more somnolent, slept all day on May 16 sudden onset garbled speech, right facial droop, transient confusion, emergency room blood pressure 90/53, CT head showed developing community hydrocephalus, likely due to recent subarachnoid and intraventricular hemorrhage, no surgical intervention was indicated per neurosurgery evaluation, suspicious for complex partial seizure, he was discharged with Keppra 500 mg twice a  day  Since recent hospital admission, there was no clinical seizure, but he was noted significant memory loss, he states, repeating questions, slept a lot, lost control of his urine, also has mild gait abnormality,  He is always a slow walker, he tends to wait on him, by reviewing the initial CT scan on Jul 14, 2018, there is already some evidence of hydrocephalus, enlarged bilateral lateral ventricle,  I personally reviewed MRI of the brain on Jul 29, 2018, persistent ventriculomegaly, 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  ALLERGIES: No Known Allergies  HOME MEDICATIONS: Current Outpatient Medications  Medication Sig Dispense Refill  . acetaminophen (TYLENOL) 325 MG tablet Take 2 tablets (650 mg total) by mouth every 6 (six) hours as needed for mild pain.    Marland Kitchen atorvastatin (LIPITOR) 80 MG tablet Take 1 tablet (80 mg total) by mouth daily. 30 tablet 1  . docusate sodium (COLACE) 100 MG capsule Take 1 capsule (100 mg total) by mouth 2 (two) times daily. For constipation 60 capsule 0  . glipiZIDE (GLUCOTROL XL) 10 MG 24 hr tablet Take 1 tablet (10 mg total) by mouth 2 (two) times daily. 60 tablet 1  . levETIRAcetam (KEPPRA) 750 MG tablet Take  1 tablet (750 mg total) by mouth 2 (two) times daily. 60 tablet 1  . methylphenidate (RITALIN) 10 MG tablet Take 1 tablet (10 mg total) by mouth 2 (two) times daily with breakfast and lunch. 60 tablet 0  . metoprolol succinate (TOPROL-XL) 100 MG 24 hr tablet Take 1 tablet (100 mg total) by mouth daily. Take with or immediately following a meal. 30 tablet 0  . tamsulosin (FLOMAX) 0.4 MG CAPS  capsule Take 1 capsule (0.4 mg total) by mouth daily after supper. 30 capsule 1   No current facility-administered medications for this visit.     PAST MEDICAL HISTORY: Past Medical History:  Diagnosis Date  . Diabetes mellitus   . Heart disease   . Hyperlipemia   . Hypertension   . Subdural hematoma (HCC)     PAST SURGICAL HISTORY: Past Surgical History:  Procedure Laterality Date  . catarracts    . OTHER SURGICAL HISTORY  2016   HEART STENT  . TONSILLECTOMY      FAMILY HISTORY: Family History  Problem Relation Age of Onset  . Stroke Father   . Hypertension Father   . Hyperlipidemia Father   . Early death Mother   . Colitis Sister   . Appendicitis Brother     SOCIAL HISTORY: Social History   Socioeconomic History  . Marital status: Married    Spouse name: Not on file  . Number of children: 2  . Years of education: college  . Highest education level: Master's degree (e.g., MA, MS, MEng, MEd, MSW, MBA)  Occupational History  . Occupation: Retired  Scientific laboratory technician  . Financial resource strain: Not on file  . Food insecurity    Worry: Not on file    Inability: Not on file  . Transportation needs    Medical: Not on file    Non-medical: Not on file  Tobacco Use  . Smoking status: Never Smoker  . Smokeless tobacco: Never Used  Substance and Sexual Activity  . Alcohol use: Yes    Comment: VERY RARELY  . Drug use: No  . Sexual activity: Not on file  Lifestyle  . Physical activity    Days per week: Not on file    Minutes per session: Not on file  . Stress: Not on file  Relationships  . Social Herbalist on phone: Not on file    Gets together: Not on file    Attends religious service: Not on file    Active member of club or organization: Not on file    Attends meetings of clubs or organizations: Not on file    Relationship status: Not on file  . Intimate partner violence    Fear of current or ex partner: Not on file    Emotionally abused: Not  on file    Physically abused: Not on file    Forced sexual activity: Not on file  Other Topics Concern  . Not on file  Social History Narrative   Lives at home with his wife.   Right-handed.   Limited caffeine use.     PHYSICAL EXAM   Vitals:   10/09/18 1359  BP: 109/60  Pulse: 64  Temp: 98.2 F (36.8 C)    Not recorded      There is no height or weight on file to calculate BMI.  PHYSICAL EXAMNIATION:  Gen: NAD, conversant, well nourised, obese, well groomed  Cardiovascular: Regular rate rhythm, no peripheral edema, warm, nontender. Eyes: Conjunctivae clear without exudates or hemorrhage Neck: Supple, no carotid bruits. Pulmonary: Clear to auscultation bilaterally   NEUROLOGICAL EXAM:  MENTAL STATUS:  Speech:    Speech is normal; fluent and spontaneous with normal comprehension.  Cognition:     Orientation to time, place and person     Normal recent and remote memory     Normal Attention span and concentration     Normal Language, naming, repeating,spontaneous speech     Fund of knowledge   CRANIAL NERVES: CN II: Visual fields are full to confrontation.  Pupils are round equal and briskly reactive to light. CN III, IV, VI: extraocular movement are normal. No ptosis. CN V: Facial sensation is intact to pinprick in all 3 divisions bilaterally. Corneal responses are intact.  CN VII: Face is symmetric with normal eye closure and smile. CN VIII: Hearing is normal to rubbing fingers CN IX, X: Palate elevates symmetrically. Phonation is normal. CN XI: Head turning and shoulder shrug are intact CN XII: Tongue is midline with normal movements and no atrophy.  MOTOR: There is no pronator drift of out-stretched arms. Muscle bulk and tone are normal. Muscle strength is normal.  REFLEXES: Reflexes are 2+ and symmetric at the biceps, triceps, knees, and ankles. Plantar responses are flexor.  SENSORY: Intact to light touch, pinprick, positional  sensation and vibratory sensation are intact in fingers and toes.  COORDINATION: Rapid alternating movements and fine finger movements are intact. There is no dysmetria on finger-to-nose and heel-knee-shin.    GAIT/STANCE: Needing help to get up from seated position,, cautious, leaning forward, unsteady  DIAGNOSTIC DATA (LABS, IMAGING, TESTING) - I reviewed patient records, labs, notes, testing and imaging myself where available.   ASSESSMENT AND PLAN  [name] Frank is a 68 y.o. male   History of traumatic fall, subdural hematoma, subarachnoid hemorrhage Evidence of hydrocephalus Complex partial seizure  Complete evaluation with MRI of the brain without contrast  Laboratory evaluations  Try carbidopa levodopa 25/100 mg 3 times  Continue Keppra 500 mg twice a day  [name] Frank, M.D. Ph.D.  [facility] [address] [place], [place] [address] Ph: [phone] Fax: [phone]  CC: Copland, Gay Filler, MD

## 2018-10-10 LAB — ANA W/REFLEX IF POSITIVE: Anti Nuclear Antibody (ANA): NEGATIVE

## 2018-10-10 LAB — RPR: RPR Ser Ql: NONREACTIVE

## 2018-10-10 LAB — VITAMIN B12: Vitamin B-12: 264 pg/mL (ref 232–1245)

## 2018-10-10 LAB — HIV ANTIBODY (ROUTINE TESTING W REFLEX): HIV Screen 4th Generation wRfx: NONREACTIVE

## 2018-10-10 LAB — SEDIMENTATION RATE: Sed Rate: 17 mm/hr (ref 0–30)

## 2018-10-10 LAB — C-REACTIVE PROTEIN: CRP: 2 mg/L (ref 0–10)

## 2018-10-13 ENCOUNTER — Telehealth: Payer: Self-pay | Admitting: Neurology

## 2018-10-13 NOTE — Telephone Encounter (Signed)
Called the pt and reviewed the lab work with hi, advised that everything was normal and there was no concern. Pt verbalized understanding. Pt had no questions at this time but was encouraged to call back if questions arise. Patient requested the results be sent to PCP. Advised I would send information over.

## 2018-10-13 NOTE — Telephone Encounter (Signed)
-----   Message from Marcial Pacas, MD sent at 10/13/2018  1:06 PM EDT ----- Please call patient for normal laboratory result

## 2018-10-14 ENCOUNTER — Ambulatory Visit (INDEPENDENT_AMBULATORY_CARE_PROVIDER_SITE_OTHER): Payer: Medicare HMO | Admitting: Psychology

## 2018-10-14 ENCOUNTER — Telehealth: Payer: Self-pay | Admitting: Neurology

## 2018-10-14 DIAGNOSIS — F4322 Adjustment disorder with anxiety: Secondary | ICD-10-CM | POA: Diagnosis not present

## 2018-10-14 NOTE — Telephone Encounter (Signed)
Mcarthur Rossetti Josem Kaufmann: [phone] (exp. 10/14/18 to 11/13/18) order sent to GI. They will reach out to the patient to schedule.

## 2018-10-18 DIAGNOSIS — S2249XA Multiple fractures of ribs, unspecified side, initial encounter for closed fracture: Secondary | ICD-10-CM | POA: Diagnosis not present

## 2018-10-20 ENCOUNTER — Other Ambulatory Visit: Payer: Self-pay

## 2018-10-20 ENCOUNTER — Encounter: Payer: Self-pay | Admitting: Physical Medicine & Rehabilitation

## 2018-10-20 ENCOUNTER — Encounter: Payer: Medicare HMO | Attending: Physical Medicine & Rehabilitation | Admitting: Physical Medicine & Rehabilitation

## 2018-10-20 VITALS — BP 121/72 | HR 51 | Temp 98.7°F | Ht 69.0 in | Wt 211.0 lb

## 2018-10-20 DIAGNOSIS — G8191 Hemiplegia, unspecified affecting right dominant side: Secondary | ICD-10-CM | POA: Insufficient documentation

## 2018-10-20 DIAGNOSIS — S065X9A Traumatic subdural hemorrhage with loss of consciousness of unspecified duration, initial encounter: Secondary | ICD-10-CM | POA: Insufficient documentation

## 2018-10-20 DIAGNOSIS — F068 Other specified mental disorders due to known physiological condition: Secondary | ICD-10-CM | POA: Diagnosis not present

## 2018-10-20 DIAGNOSIS — S069X0S Unspecified intracranial injury without loss of consciousness, sequela: Secondary | ICD-10-CM | POA: Diagnosis not present

## 2018-10-20 DIAGNOSIS — S065XAA Traumatic subdural hemorrhage with loss of consciousness status unknown, initial encounter: Secondary | ICD-10-CM

## 2018-10-20 NOTE — Progress Notes (Signed)
Subjective:    Patient ID: [name] Frank, male    DOB: 09-Sep-1950, 68 y.o.   MRN: [id number] 68 year old male with prior history of diabetes hypertension who fell on steps on five 06/2018 with traumatic brain injury subdural hemorrhage and rib fractures.  During his hospitalization was diagnosed with new onset atrial fibrillation with rapid ventricular response and placed on low-dose aspirin.  He was discharged on 512/2020 but readmitted on 07/28/2018 with lethargy and acute onset of right facial droop and slurred speech.  He was hypotensive had renal failure a CT showed a stable left frontal parietal subarachnoid hemorrhage as well as right occipital subarachnoid hemorrhage but some concerns about developing communicating hydrocephalus.  He underwent MRI scanning demonstrating ventriculomegaly with stable left subdural hematoma.  Aspirin was discontinued.  Neurosurgery evaluation did not recommend any intervention.  Patient was felt to have mental status changes related to seizure disorder.  Loaded with Keppra.  Treated with antibiotics for hydrocele, Augmentin for 2 weeks HPI Reviewed PCP notes, wife has been stressed with caregiver duties Partially dressing , mainly UE > LE  Bathing self shower  No falls Be laced with a walker with PT patient sometimes does some furniture walking without PT Therapy 1x per week, OT every other week. PT  Pain Inventory Average Pain 0 Pain Right Now 0 My pain is na  In the last 24 hours, has pain interfered with the following? General activity 0 Relation with others 0 Enjoyment of life 0 What TIME of day is your pain at its worst? na Sleep (in general) Fair  Pain is worse with: na Pain improves with: na Relief from Meds: na  Mobility walk without assistance walk with assistance use a walker ability to climb steps?  yes do you drive?  no  Function retired I need assistance with the following:  dressing, bathing, toileting, meal prep, household  duties and shopping  Neuro/Psych bladder control problems weakness trouble walking  Prior Studies Any changes since last visit?  no  Physicians involved in your care Any changes since last visit?  no   Family History  Problem Relation Age of Onset  . Stroke Father   . Hypertension Father   . Hyperlipidemia Father   . Early death Mother   . Colitis Sister   . Appendicitis Brother    Social History   Socioeconomic History  . Marital status: Married    Spouse name: Not on file  . Number of children: 2  . Years of education: college  . Highest education level: Master's degree (e.g., MA, MS, MEng, MEd, MSW, MBA)  Occupational History  . Occupation: Retired  Scientific laboratory technician  . Financial resource strain: Not on file  . Food insecurity    Worry: Not on file    Inability: Not on file  . Transportation needs    Medical: Not on file    Non-medical: Not on file  Tobacco Use  . Smoking status: Never Smoker  . Smokeless tobacco: Never Used  Substance and Sexual Activity  . Alcohol use: Yes    Comment: VERY RARELY  . Drug use: No  . Sexual activity: Not on file  Lifestyle  . Physical activity    Days per week: Not on file    Minutes per session: Not on file  . Stress: Not on file  Relationships  . Social Herbalist on phone: Not on file    Gets together: Not on file    Attends  religious service: Not on file    Active member of club or organization: Not on file    Attends meetings of clubs or organizations: Not on file    Relationship status: Not on file  Other Topics Concern  . Not on file  Social History Narrative   Lives at home with his wife.   Right-handed.   Limited caffeine use.   Past Surgical History:  Procedure Laterality Date  . catarracts    . OTHER SURGICAL HISTORY  2016   HEART STENT  . TONSILLECTOMY     Past Medical History:  Diagnosis Date  . Diabetes mellitus   . Heart disease   . Hyperlipemia   . Hypertension   . Subdural  hematoma (HCC)    There were no vitals taken for this visit.  Opioid Risk Score:   Fall Risk Score:  `1  Depression screen PHQ 2/9  Depression screen PHQ 2/9 01/16/2018  Decreased Interest 0  Down, Depressed, Hopeless 0  PHQ - 2 Score 0     Review of Systems  Constitutional: Negative.   HENT: Negative.   Eyes: Negative.   Respiratory: Negative.   Cardiovascular: Negative.   Gastrointestinal: Negative.   Endocrine: Negative.   Genitourinary: Positive for difficulty urinating.  Musculoskeletal: Positive for gait problem.  Skin: Negative.   Allergic/Immunologic: Negative.   Neurological: Positive for weakness.  Hematological: Negative.   Psychiatric/Behavioral: Negative.   All other systems reviewed and are negative.      Objective:   Physical Exam Vitals signs and nursing note reviewed.  Constitutional:      Appearance: Normal appearance.  HENT:     Head: Normocephalic and atraumatic.  Eyes:     Extraocular Movements: Extraocular movements intact.     Conjunctiva/sclera: Conjunctivae normal.     Pupils: Pupils are equal, round, and reactive to light.  Musculoskeletal:     Comments: Patient has mild pain with shoulder abduction) left side.  Neurological:     General: No focal deficit present.     Mental Status: He is alert.     Coordination: Romberg sign positive. Finger-Nose-Finger Test and Heel to [facility] Test normal. Rapid alternating movements normal.     Gait: Gait abnormal and tandem walk abnormal.     Comments: Motor strength is 5/5 bilateral deltoid bicep tricep grip hip flexor knee extensor ankle dorsiflexion plantarflexion Orientation is to person and place but not time was correctly able to identify day the week as well as date but not month or even season  Into the right during ambulation.  Patient is unable to stand with his feet together he falls backward  Psychiatric:        Mood and Affect: Mood normal.           Assessment & Plan:  1.  Left  subarachnoid hemorrhage in frontoparietal area as well as left subdural hematoma, patient has residual cognitive deficits as well as gait disorder.  ADL problems are mainly a function of his cognition as well as his balance I do think he would benefit from outpatient PT OT and speech I will follow-up in 6 weeks to monitor his progress.  Would avoid medications which further diminish cognition He is off the methylphenidate do not think he needs to restart.  Continue Keppra for seizure prophylaxis as per neurology Dr. Terrace ArabiaYan Continue carbidopa levodopa per Dr. Terrace ArabiaYan

## 2018-10-21 ENCOUNTER — Telehealth: Payer: Self-pay | Admitting: Family Medicine

## 2018-10-21 DIAGNOSIS — S065X9A Traumatic subdural hemorrhage with loss of consciousness of unspecified duration, initial encounter: Secondary | ICD-10-CM | POA: Diagnosis not present

## 2018-10-21 NOTE — Telephone Encounter (Signed)
Verbal orders given via voice-mail.

## 2018-10-21 NOTE — Telephone Encounter (Signed)
Copied from [place] [phone]. Topic: Quick Communication - Home Health Verbal Orders >> Oct 21, 2018  9:40 AM Erick Blinks wrote: Caller/Agency: [facility]len[facility] Mater  Callback Number: [phone] Requesting OT/PT/Skilled Nursing/Social Work/Speech Therapy: PT requesting verbal okay for recertification, pt is transitioning to outpatient therapy. Does not have an appt yet Frequency: 2 week 3

## 2018-10-23 ENCOUNTER — Telehealth: Payer: Self-pay | Admitting: Family Medicine

## 2018-10-23 ENCOUNTER — Other Ambulatory Visit: Payer: Self-pay

## 2018-10-23 ENCOUNTER — Encounter (HOSPITAL_BASED_OUTPATIENT_CLINIC_OR_DEPARTMENT_OTHER): Payer: Self-pay | Admitting: *Deleted

## 2018-10-23 ENCOUNTER — Other Ambulatory Visit (HOSPITAL_COMMUNITY)
Admission: RE | Admit: 2018-10-23 | Discharge: 2018-10-23 | Disposition: A | Discharge status: Home / Self Care | Payer: Medicare HMO | Source: Ambulatory Visit | Attending: Urology | Admitting: Urology

## 2018-10-23 DIAGNOSIS — Z01812 Encounter for preprocedural laboratory examination: Secondary | ICD-10-CM | POA: Insufficient documentation

## 2018-10-23 DIAGNOSIS — Z20828 Contact with and (suspected) exposure to other viral communicable diseases: Secondary | ICD-10-CM | POA: Insufficient documentation

## 2018-10-23 LAB — SARS CORONAVIRUS 2 (TAT 6-24 HRS): SARS Coronavirus 2: NEGATIVE

## 2018-10-23 NOTE — Progress Notes (Addendum)
Spoke w/ pt wife via phone for pre-op interview.  Npo after mn.  Arrive at 0830.  Needs istat.  Current ekg in chart and epic.  Will take keppra, sinemet, lipitor, toprol am dos w/ sips of water.  Pt's cardiologist and neurologist lov note's in epic and chart.  Pt denies cardiac s&s.  He does walk with walker / uses wheelchair due to gait deficit, also has memory / congnitive issues.  Chart to be reviewed by anesthesia, Konrad Felix PA.  ADDENDUM:  Per anesthesia ok to proceed w/ planned procedure, refer to Konrad Felix PA note in epic.

## 2018-10-23 NOTE — Telephone Encounter (Signed)
Don from [facility] wants to receive verbal order for pt for OT for ADL, transferring, exercise and balance issues. Requesting 1 time 1 wk 1, 0 times wk 2, then 1 times 3rd wk (skipping week 2) wants effective 8/16 verbal callback [phone]

## 2018-10-24 ENCOUNTER — Encounter (HOSPITAL_BASED_OUTPATIENT_CLINIC_OR_DEPARTMENT_OTHER): Payer: Self-pay | Admitting: *Deleted

## 2018-10-24 ENCOUNTER — Ambulatory Visit (INDEPENDENT_AMBULATORY_CARE_PROVIDER_SITE_OTHER): Payer: Medicare HMO | Admitting: Psychology

## 2018-10-24 DIAGNOSIS — F4323 Adjustment disorder with mixed anxiety and depressed mood: Secondary | ICD-10-CM

## 2018-10-24 NOTE — Progress Notes (Signed)
Anesthesia Chart Review   Case: [id number] Date/Time: 10/27/18 0953   Procedure: HYDROCELECTOMY ADULT (Bilateral )   Anesthesia type: General   Pre-op diagnosis: BILATERAL HYDROCELES   Location: [facility] OR ROOM 3 / [place]   Surgeon: [name], MD      DISCUSSION:68 y.o. never smoker with h/o HTN, DM II, CAD (NSTEMI,  stent 2016), Afib with RVR during recent admission (cannot receive anticoagulation due to high risk SDH), seizure disorder, memory deficit, gait disturbance (uses walker and wheelchair), bilateral hydroceles scheduled for above procedure 10/27/2018 with Dr. [name].   Pt experienced a fall May 4 with resultant traumatic brain injury/subdural hematoma.  He also developed A. fib with RVR during admission.  Cannot start anticoagulants due to SDH.    Seen by cardiologist, Dr. Buena Irish, 09/09/2018; stable at this visit. Per note, "Repeat CT head scans showed that the bleeding remained stable and there did not show any new signs of bleeding. Just prior to discharge he developed AF RVR and was started on a cardizem drip and metoprolol. Anticoagulation was not started secondary to SAH/SDH. He was discharged with PT/OT/SLP. He denies any cardiac complaints today. He denies chest pain, shortness of breath, dizziness, palpitations, syncope, PND and orthopnea. His blood pressure is well controlled and this is managed by the primary care. His remains on statin therapy and this was recently increased.   He had an echocardiogram at [facility] in May which showed normal LV function with no significant valvular issues.  Given the episode of atrial fibrillation while in the hospital I will have him wear a 30 day event monitor to evaluate his AF burden. Anticoagulation will need to be determined by neurosurgery. I will make no changes to his medical regimen and he will see Dr. Marijo File in 6-7 wks"  Last seen by neurologist, Dr. Marcial Pacas, 10/09/2018.   Per note he is to continue  on Keppra 500mg  twice a day and will try carbidopa 25/100mg .  Follow up scheduled 11/18/2018.  Discussed with Dr. Marcie Bal.  Anticipate pt can proceed with planned procedure barring acute status change and after evaluation DOS.  VS: Ht 5\' 9"  (1.753 m)   Wt 95.7 kg   BMI 31.16 kg/m   PROVIDERS: Copland, Gay Filler, MD is PCP   Elicia Lamp, MD is Cardiologist   Eddie North, MD is Neurologist  LABS: SDW (all labs ordered are listed, but only abnormal results are displayed)  Labs Reviewed - No data to display   IMAGES:   EKG: 08/04/2018 Rate 57 bpm Sinus rhythm  Nonspecific intraventricular conduction delay   CV: Echo 07/18/2018 IMPRESSIONS   1. The left ventricle has normal systolic function, with an ejection fraction of 55-60%. The cavity size was normal. Left ventricular diastolic function could not be evaluated secondary to atrial fibrillation.  2. The right ventricle has normal systolic function. The cavity was normal. There is no increase in right ventricular wall thickness.  3. No stenosis of the aortic valve.  4. The aortic root and ascending aorta are normal in size and structure. Past Medical History:  Diagnosis Date  . Abnormality of gait    uses walker and wheelchair  . Cognitive deficit as late effect of traumatic brain injury ([place])   . Coronary artery disease    cardiologist-- dr s. Jake Bathe---  04-06-2014  NSTEMI  s/p  cardiac cath DES x1 to LAD and POBA to D1  . History of non-ST elevation myocardial infarction (NSTEMI)  04-06-2013  s/p  coronary stent and PCI  . Hydrocele, bilateral   . Hyperlipemia   . Hypertension   . Lower urinary tract symptoms (LUTS)   . Memory deficit   . PAF (paroxysmal atrial fibrillation) (HCC) cardilogist-- dr Dickie Larohrbak   [facility]  w/ [facility],  with cardizem/ toprol, back in NSR,  f/u by cardiologist , event monitor 09-16-2018 no results in epic by per pt wife was told result ok with no afib   . S/P drug eluting coronary stent placement    04-06-2014  @[facility]   DES x1 to LAD and POBA to D1  . SAH (subarachnoid hemorrhage) [facility](HCC) neurologist-  dr Terrace Arabiayan--- last head CT 10-01-2018 resolving (10-23-2018  residual deficits memory issues, abnormal gait, congnitive   w/ SDH due to unwitnessed fall, traumatic head injury---- admission 07-14-2018 , d/c'd 08-01-2018,  readmitted 3 days later with late TBI effects,  d/c'd from rehab 08-22-2018  . Seizure disorder [facility](HCC)    followed by dr Terrace Arabiayan  . Subdural hematoma (HCC)   . Type 2 diabetes mellitus (HCC)    followed by pcp  . 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 CATARACT EXTRACTION W/ INTRAOCULAR LENS  IMPLANT, BILATERAL  2008; 2009  . CORONARY ANGIOPLASTY WITH STENT PLACEMENT  04-06-2014  @[facility]    DES x1 to LAD and POBA to D1  . TONSILLECTOMY  child    MEDICATIONS: No current facility-administered medications for this encounter.    Marland Kitchen. acetaminophen (TYLENOL) 325 MG tablet  . atorvastatin (LIPITOR) 80 MG tablet  . carbidopa-levodopa (SINEMET IR) 25-100 MG tablet  . docusate sodium (COLACE) 100 MG capsule  . glipiZIDE (GLUCOTROL XL) 10 MG 24 hr tablet  . levETIRAcetam (KEPPRA) 500 MG tablet  . metoprolol succinate (TOPROL-XL) 100 MG 24 hr tablet  . tamsulosin (FLOMAX) 0.4 MG CAPS capsule    Janey GentaJessica Tereso Unangst, PA-C [facility] Pre[facility]Surgical Testing [phone] 10/24/18  4:49 PM

## 2018-10-24 NOTE — Telephone Encounter (Signed)
Gave verbal Josem Kaufmann of below orders to [facility]. Please advise if any further recommendation.

## 2018-10-27 ENCOUNTER — Ambulatory Visit (HOSPITAL_BASED_OUTPATIENT_CLINIC_OR_DEPARTMENT_OTHER): Payer: Medicare HMO | Admitting: Physician Assistant

## 2018-10-27 ENCOUNTER — Other Ambulatory Visit: Payer: Self-pay

## 2018-10-27 ENCOUNTER — Encounter (HOSPITAL_BASED_OUTPATIENT_CLINIC_OR_DEPARTMENT_OTHER): Payer: Self-pay | Admitting: Anesthesiology

## 2018-10-27 ENCOUNTER — Ambulatory Visit (HOSPITAL_BASED_OUTPATIENT_CLINIC_OR_DEPARTMENT_OTHER)
Admission: RE | Admit: 2018-10-27 | Discharge: 2018-10-27 | Disposition: A | Discharge status: Home / Self Care | Payer: Medicare HMO | Source: Ambulatory Visit | Attending: Urology | Admitting: Urology

## 2018-10-27 ENCOUNTER — Encounter (HOSPITAL_BASED_OUTPATIENT_CLINIC_OR_DEPARTMENT_OTHER)
Admission: RE | Disposition: A | Discharge status: Home / Self Care | Payer: Self-pay | Source: Ambulatory Visit | Attending: Urology

## 2018-10-27 DIAGNOSIS — Z79899 Other long term (current) drug therapy: Secondary | ICD-10-CM | POA: Diagnosis not present

## 2018-10-27 DIAGNOSIS — R32 Unspecified urinary incontinence: Secondary | ICD-10-CM | POA: Insufficient documentation

## 2018-10-27 DIAGNOSIS — I10 Essential (primary) hypertension: Secondary | ICD-10-CM | POA: Diagnosis not present

## 2018-10-27 DIAGNOSIS — Z955 Presence of coronary angioplasty implant and graft: Secondary | ICD-10-CM | POA: Diagnosis not present

## 2018-10-27 DIAGNOSIS — E785 Hyperlipidemia, unspecified: Secondary | ICD-10-CM | POA: Insufficient documentation

## 2018-10-27 DIAGNOSIS — N433 Hydrocele, unspecified: Secondary | ICD-10-CM | POA: Diagnosis not present

## 2018-10-27 DIAGNOSIS — I2510 Atherosclerotic heart disease of native coronary artery without angina pectoris: Secondary | ICD-10-CM | POA: Insufficient documentation

## 2018-10-27 DIAGNOSIS — R4189 Other symptoms and signs involving cognitive functions and awareness: Secondary | ICD-10-CM | POA: Diagnosis not present

## 2018-10-27 DIAGNOSIS — I480 Paroxysmal atrial fibrillation: Secondary | ICD-10-CM | POA: Diagnosis not present

## 2018-10-27 DIAGNOSIS — E119 Type 2 diabetes mellitus without complications: Secondary | ICD-10-CM | POA: Insufficient documentation

## 2018-10-27 DIAGNOSIS — R413 Other amnesia: Secondary | ICD-10-CM | POA: Diagnosis not present

## 2018-10-27 DIAGNOSIS — Z7984 Long term (current) use of oral hypoglycemic drugs: Secondary | ICD-10-CM | POA: Diagnosis not present

## 2018-10-27 DIAGNOSIS — R269 Unspecified abnormalities of gait and mobility: Secondary | ICD-10-CM | POA: Insufficient documentation

## 2018-10-27 DIAGNOSIS — W19XXXS Unspecified fall, sequela: Secondary | ICD-10-CM | POA: Diagnosis not present

## 2018-10-27 DIAGNOSIS — R3911 Hesitancy of micturition: Secondary | ICD-10-CM | POA: Insufficient documentation

## 2018-10-27 DIAGNOSIS — S069X0S Unspecified intracranial injury without loss of consciousness, sequela: Secondary | ICD-10-CM | POA: Insufficient documentation

## 2018-10-27 DIAGNOSIS — G40909 Epilepsy, unspecified, not intractable, without status epilepticus: Secondary | ICD-10-CM | POA: Insufficient documentation

## 2018-10-27 DIAGNOSIS — I252 Old myocardial infarction: Secondary | ICD-10-CM | POA: Insufficient documentation

## 2018-10-27 HISTORY — DX: Other symptoms and signs involving cognitive functions and awareness: R41.89

## 2018-10-27 HISTORY — DX: Unspecified abnormalities of gait and mobility: R26.9

## 2018-10-27 HISTORY — DX: Other specified mental disorders due to known physiological condition: S06.9X0S

## 2018-10-27 HISTORY — DX: Hydrocele, unspecified: N43.3

## 2018-10-27 HISTORY — DX: Paroxysmal atrial fibrillation: I48.0

## 2018-10-27 HISTORY — DX: Nontraumatic subarachnoid hemorrhage, unspecified: I60.9

## 2018-10-27 HISTORY — DX: Presence of coronary angioplasty implant and graft: Z95.5

## 2018-10-27 HISTORY — DX: Atherosclerotic heart disease of native coronary artery without angina pectoris: I25.10

## 2018-10-27 HISTORY — DX: Unspecified urinary incontinence: R32

## 2018-10-27 HISTORY — PX: HYDROCELE EXCISION: SHX482

## 2018-10-27 HISTORY — DX: Other amnesia: R41.3

## 2018-10-27 HISTORY — DX: Epilepsy, unspecified, not intractable, without status epilepticus: G40.909

## 2018-10-27 HISTORY — DX: Other specified mental disorders due to known physiological condition: F06.8

## 2018-10-27 HISTORY — DX: Type 2 diabetes mellitus without complications: E11.9

## 2018-10-27 HISTORY — DX: Unspecified symptoms and signs involving the genitourinary system: R39.9

## 2018-10-27 HISTORY — DX: Old myocardial infarction: I25.2

## 2018-10-27 LAB — POCT I-STAT 4, (NA,K, GLUC, HGB,HCT)
Glucose, Bld: 260 mg/dL — ABNORMAL HIGH (ref 70–99)
HCT: 43 % (ref 39.0–52.0)
Hemoglobin: 14.6 g/dL (ref 13.0–17.0)
Potassium: 4.2 mmol/L (ref 3.5–5.1)
Sodium: 138 mmol/L (ref 135–145)

## 2018-10-27 LAB — GLUCOSE, CAPILLARY: Glucose-Capillary: 250 mg/dL — ABNORMAL HIGH (ref 70–99)

## 2018-10-27 SURGERY — HYDROCELECTOMY
Anesthesia: General | Site: Scrotum | Laterality: Bilateral

## 2018-10-27 MED ORDER — LIDOCAINE 2% (20 MG/ML) 5 ML SYRINGE
INTRAMUSCULAR | Status: DC | PRN
  Administered 2018-10-27: 100 mg via INTRAVENOUS

## 2018-10-27 MED ORDER — FENTANYL CITRATE (PF) 100 MCG/2ML IJ SOLN
INTRAMUSCULAR | Status: DC | PRN
  Administered 2018-10-27 (×3): 50 ug via INTRAVENOUS

## 2018-10-27 MED ORDER — PROPOFOL 10 MG/ML IV BOLUS
INTRAVENOUS | Status: DC | PRN
  Administered 2018-10-27: 50 mg via INTRAVENOUS
  Administered 2018-10-27: 150 mg via INTRAVENOUS

## 2018-10-27 MED ORDER — LABETALOL HCL 5 MG/ML IV SOLN
INTRAVENOUS | Status: AC
  Filled 2018-10-27: qty 4

## 2018-10-27 MED ORDER — BACITRACIN-NEOMYCIN-POLYMYXIN OINTMENT TUBE
TOPICAL_OINTMENT | CUTANEOUS | Status: DC | PRN
  Administered 2018-10-27: 1 via TOPICAL

## 2018-10-27 MED ORDER — FENTANYL CITRATE (PF) 100 MCG/2ML IJ SOLN
INTRAMUSCULAR | Status: AC
  Filled 2018-10-27: qty 2

## 2018-10-27 MED ORDER — LACTATED RINGERS IV SOLN
INTRAVENOUS | Status: DC
  Administered 2018-10-27 (×2): via INTRAVENOUS
  Filled 2018-10-27: qty 1000

## 2018-10-27 MED ORDER — LIDOCAINE 2% (20 MG/ML) 5 ML SYRINGE
INTRAMUSCULAR | Status: AC
  Filled 2018-10-27: qty 5

## 2018-10-27 MED ORDER — MEPERIDINE HCL 25 MG/ML IJ SOLN
6.2500 mg | INTRAMUSCULAR | Status: DC | PRN
  Filled 2018-10-27: qty 1

## 2018-10-27 MED ORDER — ONDANSETRON HCL 4 MG/2ML IJ SOLN
INTRAMUSCULAR | Status: AC
  Filled 2018-10-27: qty 2

## 2018-10-27 MED ORDER — METOCLOPRAMIDE HCL 5 MG/ML IJ SOLN
10.0000 mg | Freq: Once | INTRAMUSCULAR | Status: DC | PRN
  Filled 2018-10-27: qty 2

## 2018-10-27 MED ORDER — CEFAZOLIN SODIUM-DEXTROSE 2-4 GM/100ML-% IV SOLN
2.0000 g | Freq: Once | INTRAVENOUS | Status: AC
  Administered 2018-10-27: 0.5 g via INTRAVENOUS
  Filled 2018-10-27: qty 100

## 2018-10-27 MED ORDER — LABETALOL HCL 5 MG/ML IV SOLN
INTRAVENOUS | Status: DC | PRN
  Administered 2018-10-27: 5 mg via INTRAVENOUS

## 2018-10-27 MED ORDER — PROPOFOL 10 MG/ML IV BOLUS
INTRAVENOUS | Status: AC
  Filled 2018-10-27: qty 40

## 2018-10-27 MED ORDER — FENTANYL CITRATE (PF) 100 MCG/2ML IJ SOLN
25.0000 ug | INTRAMUSCULAR | Status: DC | PRN
  Filled 2018-10-27: qty 1

## 2018-10-27 MED ORDER — ONDANSETRON HCL 4 MG/2ML IJ SOLN
INTRAMUSCULAR | Status: DC | PRN
  Administered 2018-10-27: 4 mg via INTRAVENOUS

## 2018-10-27 MED ORDER — CEFAZOLIN SODIUM-DEXTROSE 2-4 GM/100ML-% IV SOLN
INTRAVENOUS | Status: AC
  Filled 2018-10-27: qty 100

## 2018-10-27 MED ORDER — BUPIVACAINE-EPINEPHRINE 0.5% -1:200000 IJ SOLN
INTRAMUSCULAR | Status: DC | PRN
  Administered 2018-10-27: 20 mL

## 2018-10-27 MED ORDER — HYDROCODONE-ACETAMINOPHEN 10-325 MG PO TABS: 1.0000 | ORAL_TABLET | ORAL | 0 refills | Status: DC | PRN

## 2018-10-27 SURGICAL SUPPLY — 47 items
BLADE CLIPPER SENSICLIP SURGIC (BLADE) ×1 IMPLANT
BLADE SURG 15 STRL LF DISP TIS (BLADE) ×1 IMPLANT
BLADE SURG 15 STRL SS (BLADE) ×1
BNDG GAUZE ELAST 4 BULKY (GAUZE/BANDAGES/DRESSINGS) ×2 IMPLANT
CANISTER SUCT 3000ML PPV (MISCELLANEOUS) ×1 IMPLANT
CANISTER SUCTION 1200CC (MISCELLANEOUS) IMPLANT
CLEANER CAUTERY TIP 5X5 PAD (MISCELLANEOUS) IMPLANT
COVER BACK TABLE 60X90IN (DRAPES) ×2 IMPLANT
COVER MAYO STAND STRL (DRAPES) ×2 IMPLANT
COVER WAND RF STERILE (DRAPES) ×2 IMPLANT
DISSECTOR ROUND CHERRY 3/8 STR (MISCELLANEOUS) IMPLANT
DRAIN PENROSE 18X1/4 LTX STRL (WOUND CARE) IMPLANT
DRAPE LAPAROTOMY 100X72 PEDS (DRAPES) ×2 IMPLANT
ELECT REM PT RETURN 9FT ADLT (ELECTROSURGICAL)
ELECTRODE REM PT RTRN 9FT ADLT (ELECTROSURGICAL) IMPLANT
GAUZE SPONGE 4X4 12PLY STRL (GAUZE/BANDAGES/DRESSINGS) ×1 IMPLANT
GLOVE BIO SURGEON STRL SZ8 (GLOVE) ×2 IMPLANT
GLOVE BIOGEL PI IND STRL 8.5 (GLOVE) IMPLANT
GLOVE BIOGEL PI INDICATOR 8.5 (GLOVE) ×1
GLOVE SURG SS PI 8.5 STRL IVOR (GLOVE) ×1
GLOVE SURG SS PI 8.5 STRL STRW (GLOVE) IMPLANT
GOWN STRL REUS W/ TWL LRG LVL3 (GOWN DISPOSABLE) ×1 IMPLANT
GOWN STRL REUS W/ TWL XL LVL3 (GOWN DISPOSABLE) ×1 IMPLANT
GOWN STRL REUS W/TWL LRG LVL3 (GOWN DISPOSABLE)
GOWN STRL REUS W/TWL XL LVL3 (GOWN DISPOSABLE) ×2
KIT TURNOVER CYSTO (KITS) ×2 IMPLANT
NDL HYPO 25X1 1.5 SAFETY (NEEDLE) ×1 IMPLANT
NEEDLE HYPO 25X1 1.5 SAFETY (NEEDLE) ×2 IMPLANT
NS IRRIG 500ML POUR BTL (IV SOLUTION) ×2 IMPLANT
PACK BASIN DAY SURGERY FS (CUSTOM PROCEDURE TRAY) ×2 IMPLANT
PAD CLEANER CAUTERY TIP 5X5 (MISCELLANEOUS)
PENCIL BUTTON HOLSTER BLD 10FT (ELECTRODE) ×2 IMPLANT
SOL PREP PROV IODINE SCRUB 4OZ (MISCELLANEOUS) ×1 IMPLANT
SUPPORT SCROTAL LG STRP (MISCELLANEOUS) ×2 IMPLANT
SUT CHROMIC 3 0 SH 27 (SUTURE) ×6 IMPLANT
SUT ETHILON 3 0 PS 1 (SUTURE) IMPLANT
SUT SILK 4 0 TIES 17X18 (SUTURE) IMPLANT
SUT VIC AB 4-0 RB1 27 (SUTURE)
SUT VIC AB 4-0 RB1 27X BRD (SUTURE) IMPLANT
SUT VICRYL 6 0 RB 1 (SUTURE) IMPLANT
SYR CONTROL 10ML LL (SYRINGE) ×2 IMPLANT
TOWEL OR 17X26 10 PK STRL BLUE (TOWEL DISPOSABLE) ×2 IMPLANT
TRAY DSU PREP LF (CUSTOM PROCEDURE TRAY) ×2 IMPLANT
TUBE CONNECTING 12X1/4 (SUCTIONS) ×2 IMPLANT
WATER STERILE IRR 3000ML UROMA (IV SOLUTION) IMPLANT
WATER STERILE IRR 500ML POUR (IV SOLUTION) ×1 IMPLANT
YANKAUER SUCT BULB TIP NO VENT (SUCTIONS) ×2 IMPLANT

## 2018-10-27 NOTE — Op Note (Signed)
PATIENT:  [name] Frank  PRE-OPERATIVE DIAGNOSIS: Bilateral hydroceles  POST-OPERATIVE DIAGNOSIS:  Same  PROCEDURE:  Procedure(s): Bilateral hydrocelectomy  SURGEON:  Claybon Jabs  INDICATION: [name] Frank is a 68 year old male who was found to have a large left hydrocele by ultrasound in 5/20 and a small to moderate right hydrocele with normal testicles.  The hydroceles prevent placement of a condom catheter which has been necessary due to incontinence and we therefore discussed the options for management and he is elected to proceed with hydrocelectomy.  ANESTHESIA:  General  EBL:  Minimal  DRAINS: None  LOCAL MEDICATIONS USED: 1/2 percent Marcaine with epinephrine  SPECIMEN:  None  DISPOSITION OF SPECIMEN:  N/A  Description of procedure: After informed consent the patient was brought to the major OR, placed on the table and administered general anesthesia. His genitalia was then sterilely prepped and draped. An official timeout was then performed.  A midline median raphae scrotal incision was then made and carried down over the left hydrocele initially. The tissue over the hydrocele was cleared using a combination of sharp and blunt technique. The hydrocele was then opened, drained of clear amber fluid and delivered through the incision. The excess hydrocele sac tissue was excised with the Bovie electrocautery and then I cauterized the edges. I then reapproximated the edges posteriorly behind the epididymis with a running, locking 3-0 chromic suture. The appendix testis was removed with the Bovie.  I then replaced his left testicle in the left hemiscrotum and turned my attention to the right testicle.  It was treated in an identical fashion with less fluid found within.  The hydrocele sac was managed identically as well.  His right testicle was then replaced in its normal anatomic position.   I then closed the deep scrotal tissue with running 3-0 chromic suture in a locking fashion. I  injected half percent Marcaine in the subcutaneous tissue and closed the skin with running 3-0 chromic. Neosporin, a sterile gauze dressing, fluff Kerlix and a scrotal support were applied. The patient tolerated the procedure well no intraoperative complications. Needle sponge and instrument counts were correct at the end of the operation.   PLAN OF CARE: Discharge to home after PACU  PATIENT DISPOSITION:  PACU - hemodynamically stable.

## 2018-10-27 NOTE — Anesthesia Preprocedure Evaluation (Addendum)
Anesthesia Evaluation  Patient identified by MRN, date of birth, ID band Patient awake    Reviewed: Allergy & Precautions, NPO status , Patient's Chart, lab work & pertinent test results  Airway Mallampati: II  TM Distance: >3 FB Neck ROM: Full    Dental no notable dental hx.    Pulmonary neg pulmonary ROS,    Pulmonary exam normal breath sounds clear to auscultation       Cardiovascular hypertension, Pt. on medications + CAD and + Cardiac Stents (2016)  Normal cardiovascular exam+ dysrhythmias Atrial Fibrillation  Rhythm:Regular Rate:Normal     Neuro/Psych Seizures -,  Resolving subdural hematoma negative psych ROS   GI/Hepatic negative GI ROS, Neg liver ROS, hiatal hernia, neg GERD  ,  Endo/Other  diabetes, Type 2  Renal/GU negative Renal ROS  negative genitourinary   Musculoskeletal negative musculoskeletal ROS (+)   Abdominal   Peds negative pediatric ROS (+)  Hematology negative hematology ROS (+)   Anesthesia Other Findings   Reproductive/Obstetrics negative OB ROS                            Anesthesia Physical Anesthesia Plan  ASA: III  Anesthesia Plan: General   Post-op Pain Management:    Induction: Intravenous  PONV Risk Score and Plan: 2 and Ondansetron and Treatment may vary due to age or medical condition  Airway Management Planned: LMA  Additional Equipment:   Intra-op Plan:   Post-operative Plan: Extubation in OR  Informed Consent: I have reviewed the patients History and Physical, chart, labs and discussed the procedure including the risks, benefits and alternatives for the proposed anesthesia with the patient or authorized representative who has indicated his/her understanding and acceptance.     Dental advisory given  Plan Discussed with: CRNA  Anesthesia Plan Comments:        Anesthesia Quick Evaluation

## 2018-10-27 NOTE — Discharge Instructions (Signed)
Scrotal surgery postoperative instructions ° °Wound: ° °In most cases your incision will have absorbable sutures that will dissolve within the first 10-20 days. Some will fall out even earlier. Expect some redness as the sutures dissolved but this should occur only around the sutures. If there is generalized redness, especially with increasing pain or swelling, let us know. The scrotum will very likely get "black and blue" as the blood in the tissues spread. Sometimes the whole scrotum will turn colors. The black and blue is followed by a yellow and brown color. In time, all the discoloration will go away. In some cases some firm swelling in the area of the testicle may persist for up to 4-6 weeks after the surgery and is considered normal in most cases. ° °Diet: ° °You may return to your normal diet within 24 hours following your surgery. You may note some mild nausea and possibly vomiting the first 6-8 hours following surgery. This is usually due to the side effects of anesthesia, and will disappear quite soon. I would suggest clear liquids and a very light meal the first evening following your surgery. ° °Activity: ° °Your physical activity should be restricted the first 48 hours. During that time you should remain relatively inactive, moving about only when necessary. During the first 7-10 days following surgery he should avoid lifting any heavy objects (anything greater than 15 pounds), and avoid strenuous exercise. If you work, ask us specifically about your restrictions, both for work and home. We will write a note to your employer if needed. ° °You should plan to wear a tight pair of jockey shorts or an athletic supporter for the first 4-5 days, even to sleep. This will keep the scrotum immobilized to some degree and keep the swelling down. ° °Ice packs should be placed on and off over the scrotum for the first 48 hours. Frozen peas or corn in a ZipLock bag can be frozen, used and re-frozen. Fifteen minutes  on and 15 minutes off is a reasonable schedule. The ice is a good pain reliever and keeps the swelling down. ° °Hygiene: ° °You may shower 48 hours after your surgery. Tub bathing should be restricted until the seventh day. ° °Medication: ° °You will be sent home with some type of pain medication. In many cases you will be sent home with a narcotic pain pill (hydrococone or oxycodone). If the pain is not too bad, you may take either Tylenol (acetaminophen) or Advil (ibuprofen) which contain no narcotic agents, and might be tolerated a little better, with fewer side effects. If the pain medication you are sent home with does not control the pain, you will have to let us know. Some narcotic pain medications cannot be given or refilled by a phone call to a pharmacy. ° °Problems you should report to us: ° °· Fever of 101.0 degrees Fahrenheit or greater. °· Moderate or severe swelling under the skin incision or involving the scrotum. °· Drug reaction such as hives, a rash, nausea or vomiting. ° ° °Post Anesthesia Home Care Instructions ° °Activity: °Get plenty of rest for the remainder of the day. A responsible individual must stay with you for 24 hours following the procedure.  °For the next 24 hours, DO NOT: °-Drive a car °-Operate machinery °-Drink alcoholic beverages °-Take any medication unless instructed by your physician °-Make any legal decisions or sign important papers. ° °Meals: °Start with liquid foods such as gelatin or soup. Progress to regular foods as tolerated. Avoid   greasy, spicy, heavy foods. If nausea and/or vomiting occur, drink only clear liquids until the nausea and/or vomiting subsides. Call your physician if vomiting continues. ° °Special Instructions/Symptoms: °Your throat may feel dry or sore from the anesthesia or the breathing tube placed in your throat during surgery. If this causes discomfort, gargle with warm salt water. The discomfort should disappear within 24 hours. ° °If you had a  scopolamine patch placed behind your ear for the management of post- operative nausea and/or vomiting: ° °1. The medication in the patch is effective for 72 hours, after which it should be removed.  Wrap patch in a tissue and discard in the trash. Wash hands thoroughly with soap and water. °2. You may remove the patch earlier than 72 hours if you experience unpleasant side effects which may include dry mouth, dizziness or visual disturbances. °3. Avoid touching the patch. Wash your hands with soap and water after contact with the patch. °  °

## 2018-10-27 NOTE — H&P (Signed)
HPI: [name] Frank is a 68 year-old male with bilateral hydroceles.  His hydrocele is on both sides. The patient has undergone a scrotal ultrasound. He does not have pain on the side of his hydrocele. His hydrocele does cause restriction of normal activities.   He has not had injuries to the testicles or scrotum. He has not had scrotal surgery. He has not had a testicular infection. His hydrocele does not bother him enough to consider surgical repair. This condition would be considered of mild to moderate severity with no modifying factors or associated signs or symptoms other than as noted above.   09/09/18: A scrotal ultrasound on 07/29/18 revealed a large left hydrocele and a small-moderate sized right hydrocele with normal testicles.  The patient presented today with his wife. 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 She is not sure how long his hydroceles have been present. He does not indicate he is having any pain. The hydroceles do prevent placement of a condom catheter which has been necessary due to incontinence.     CC/HPI: 09/09/18: He has voiding symptoms that are a combination of obstructive and as well as irritated. He has what sounds like incontinence and requires a diaper for that. He was started on tamsulosin in the hospital and has been taking 0.4 mg because he also has a lot of hesitancy.     ALLERGIES: No Known Drug Allergies    MEDICATIONS: Metoprolol Succinate  Tamsulosin Hcl  Atorvastatin Calcium 80 mg tablet  Diltiazem 24Hr Er 180 mg capsule, extended release 24hr  Glipizide 10 mg tablet  Levetiracetam 750 mg tablet  Methylphenidate Er     GU PSH: None   NON-GU PSH: Cardiac Stent Placement Cataract surgery     GU PMH: None   NON-GU PMH: Arthritis Depression GERD Myocardial Infarction    FAMILY HISTORY: 1 Daughter - Runs in Family 1 son - Runs in Family Breast Cancer -  Runs in Family Colitis - Runs in Family nephrolithiasis - Runs in Family   SOCIAL HISTORY: Marital Status: Married Preferred Language: English; Ethnicity: Not Hispanic Or Latino; Race: White Current Smoking Status: Patient has never smoked.   Tobacco Use Assessment Completed: Used Tobacco in last 30 days? Has never drank.  Drinks 2 caffeinated drinks per day.    REVIEW OF SYSTEMS:    GU Review Male:   Patient reports get up at night to urinate, leakage of urine, and trouble starting your stream. Patient denies frequent urination, hard to postpone urination, burning/ pain with urination, stream starts and stops, have to strain to urinate , erection problems, and penile pain.  Gastrointestinal (Upper):   Patient denies nausea, vomiting, and indigestion/ heartburn.  Gastrointestinal (Lower):   Patient reports constipation. Patient denies diarrhea.  Constitutional:   Patient reports fatigue. Patient denies fever, night sweats, and weight loss.  Skin:   Patient reports skin rash/ lesion and itching.   Eyes:   Patient denies blurred vision and double vision.  Ears/ Nose/ Throat:   Patient denies sore throat and sinus problems.  Hematologic/Lymphatic:   Patient denies swollen glands and easy bruising.  Cardiovascular:   Patient reports leg swelling. Patient denies chest pains.  Respiratory:   Patient denies cough and shortness of breath.  Endocrine:   Patient denies excessive thirst.  Musculoskeletal:   Patient reports back pain. Patient denies joint pain.  Neurological:   Patient denies headaches and dizziness.  Psychologic:   Patient denies depression and anxiety.     GU PHYSICAL EXAMINATION:    Scrotum: No lesions. No edema. No cysts. No warts.  Epididymides: Right: no spermatocele, no masses, no cysts, no tenderness, no induration, no enlargement. Left: no spermatocele, no masses, no cysts, no tenderness, no induration, no enlargement.  Testes: 5+ cm hydrocele left testis. 3-5 cm  hydrocele right testis. No tenderness, no swelling, no enlargement left testis. No tenderness, no swelling, no enlargement right testis. Normal location left testis. Normal location right testis. No mass, no cyst, no varicocele left testis. No mass, no cyst, no varicocele right testis.   Urethral Meatus: Normal size. No lesion, no wart, no discharge, no polyp. Normal location.  Penis: Circumcised, no warts, no cracks. No dorsal Peyronie's plaques, no left corporal Peyronie's plaques, no right corporal Peyronie's plaques, no scarring, no warts. No balanitis, no meatal stenosis.   MULTI-SYSTEM PHYSICAL EXAMINATION:    Constitutional: Well-nourished. No physical deformities. Normally developed. Good grooming.  Neck: Neck symmetrical, not swollen. Normal tracheal position.  Respiratory: No labored breathing, no use of accessory muscles.   Cardiovascular: Normal temperature, normal extremity pulses, no swelling, no varicosities.  Lymphatic: No enlargement of neck, axillae, groin.  Skin: No paleness, no jaundice, no cyanosis. No lesion, no ulcer, no rash.  Neurologic / Psychiatric: Oriented to time, oriented to place, oriented to person. No depression, no anxiety, no agitation.  Gastrointestinal: No mass, no tenderness, no rigidity, non obese abdomen.  Eyes: Normal conjunctivae. Normal eyelids.  Ears, Nose, Mouth, and Throat: Left ear no scars, no lesions, no masses. Right ear no scars, no lesions, no masses. Nose no scars, no lesions, no masses. Normal hearing. Normal lips.  Musculoskeletal: Normal gait and station of head and neck.     PAST DATA REVIEWED:  Source Of History:  Patient  Lab Test Review:   PSA, BUN/Creatinine  Records Review:   Previous Hospital Records  X-Ray Review: Scrotal Ultrasound: Reviewed Films. Reviewed Report.    Notes:                     In 11/19 his PSA was noted to be normal at 0.30. A creatinine on 08/22/18 was 0.84.   PROCEDURES:          Urinalysis Dipstick  Dipstick Cont'd  Color: Amber Bilirubin: Neg mg/dL  Appearance: Clear Ketones: Neg mg/dL  Specific Gravity: 1.025 Blood: Neg ery/uL  pH: <=5.0 Protein: Neg mg/dL  Glucose: 3+ mg/dL Urobilinogen: 0.2 mg/dL    Nitrites: Neg    Leukocyte Esterase: Neg leu/uL    ASSESSMENT/PLAN:       ICD-10 Details  1 GU:   Hydrocele, Unspec - N43.3 Bilateral,   We discussed management of hydroceles. I told him that because they are benign no treatment was absolutely necessary. We did discuss aspiration and the high probability of recurrence with this form of therapy as well as definitive management with surgery. To that and I discussed the surgical procedure including the incision used, how the procedure would be performed in order to prevent recurrence, the outpatient nature of the surgery as well as the probability of success and the anticipated postoperative course. 2   Urinary Hesitancy - R39.11 Although he has hesitancy also has incontinence. I am not sure which of these bothers him the most because of his brain injury but I am going to try to increase the dosage of his tamsulosin and if he continues to have difficulty I think  he would best benefit from further evaluation with urodynamics.  3   Incontinence w/o Sensation - N39.42 He does have incontinence and I think likely secondary to the effects of brain injury I do not know if the tamsulosin will help but if he continues to have difficulty when he returns I will check a PVR and consider urodynamics.

## 2018-10-27 NOTE — Anesthesia Postprocedure Evaluation (Signed)
Anesthesia Post Note  Patient: [name] Frank  Procedure(s) Performed: HYDROCELECTOMY ADULT (Bilateral Scrotum)     Patient location during evaluation: PACU Anesthesia Type: General Level of consciousness: awake and alert Pain management: pain level controlled Vital Signs Assessment: post-procedure vital signs reviewed and stable Respiratory status: spontaneous breathing, nonlabored ventilation, respiratory function stable and patient connected to nasal cannula oxygen Cardiovascular status: blood pressure returned to baseline and stable Postop Assessment: no apparent nausea or vomiting Anesthetic complications: no    Last Vitals:  Vitals:   10/27/18 1117 10/27/18 1121  BP: 135/66   Pulse: (!) 51 (!) 49  Resp: (!) 22 16  Temp: 36.9 C   SpO2: 94% 97%    Last Pain:  Vitals:   10/27/18 1126  TempSrc:   PainSc: 0-No pain                 Montez Hageman

## 2018-10-27 NOTE — Transfer of Care (Signed)
Immediate Anesthesia Transfer of Care Note  Patient: [name] Frank  Procedure(s) Performed: HYDROCELECTOMY ADULT (Bilateral Scrotum)  Patient Location: PACU  Anesthesia Type:General  Level of Consciousness: sedated  Airway & Oxygen Therapy: Patient Spontanous Breathing and Patient connected to nasal cannula oxygen  Post-op Assessment: Report given to RN  Post vital signs: Reviewed and stable  Last Vitals:  Vitals Value Taken Time  BP 125/66 10/27/18 1045  Temp 36.8 C 10/27/18 1030  Pulse 50 10/27/18 1056  Resp 14 10/27/18 1056  SpO2 95 % 10/27/18 1056  Vitals shown include unvalidated device data.  Last Pain:  Vitals:   10/27/18 1030  TempSrc:   PainSc: Asleep         Complications: No apparent anesthesia complications

## 2018-10-27 NOTE — Anesthesia Procedure Notes (Signed)
Procedure Name: LMA Insertion Date/Time: 10/27/2018 9:37 AM Performed by: Bonney Aid, CRNA Pre-anesthesia Checklist: Patient identified, Emergency Drugs available, Suction available and Patient being monitored Patient Re-evaluated:Patient Re-evaluated prior to induction Oxygen Delivery Method: Circle system utilized Preoxygenation: Pre-oxygenation with 100% oxygen Induction Type: IV induction Ventilation: Mask ventilation without difficulty LMA: LMA inserted LMA Size: 5.0 Number of attempts: 1 Airway Equipment and Method: Bite block Placement Confirmation: positive ETCO2 Tube secured with: Tape Dental Injury: Teeth and Oropharynx as per pre-operative assessment

## 2018-10-28 ENCOUNTER — Telehealth: Payer: Self-pay | Admitting: Family Medicine

## 2018-10-28 NOTE — Telephone Encounter (Signed)
Caller name: Roselie Awkward  Relation to pt: PT from [place]  Call back number: [phone]    Reason for call:  Patient reported fall incident on Sunday 10/26/2018 slipped on wet floor declined hitting head according to spouse, no injuries noted.

## 2018-10-28 NOTE — Telephone Encounter (Signed)
Called back and spoke with Roselie Awkward- no injury, nothing needed at this time

## 2018-10-29 ENCOUNTER — Encounter (HOSPITAL_BASED_OUTPATIENT_CLINIC_OR_DEPARTMENT_OTHER): Payer: Self-pay | Admitting: Urology

## 2018-10-30 ENCOUNTER — Ambulatory Visit: Payer: Medicare HMO | Admitting: Psychology

## 2018-10-30 ENCOUNTER — Ambulatory Visit (INDEPENDENT_AMBULATORY_CARE_PROVIDER_SITE_OTHER): Payer: Medicare HMO | Admitting: Psychology

## 2018-10-30 DIAGNOSIS — F4323 Adjustment disorder with mixed anxiety and depressed mood: Secondary | ICD-10-CM

## 2018-11-03 ENCOUNTER — Ambulatory Visit: Payer: Medicare HMO | Admitting: Occupational Therapy

## 2018-11-03 ENCOUNTER — Ambulatory Visit: Payer: Medicare HMO | Admitting: Physical Therapy

## 2018-11-03 DIAGNOSIS — R3911 Hesitancy of micturition: Secondary | ICD-10-CM | POA: Diagnosis not present

## 2018-11-05 ENCOUNTER — Other Ambulatory Visit: Payer: Self-pay | Admitting: Family Medicine

## 2018-11-05 DIAGNOSIS — I2510 Atherosclerotic heart disease of native coronary artery without angina pectoris: Secondary | ICD-10-CM

## 2018-11-07 ENCOUNTER — Telehealth: Payer: Self-pay

## 2018-11-07 NOTE — Telephone Encounter (Signed)
I touched base with his wife.  [name] Frank seems to be ok.  They are trying to monitor him but he will sometimes still find an opportunity to go off on his own

## 2018-11-07 NOTE — Telephone Encounter (Signed)
Copied from [place] [id number]. Topic: General - Inquiry >> Nov 07, 2018  9:50 AM Richardo Priest, NT wrote: Reason for CRM: Roselie Awkward called in stating pt's wife reported to him that pt fell on Wednesday. No injuries reported just a very small right knee abrasion that is superficial. Roselie Awkward stated that pt walked to mailbox unsupervised. Please advise. Call back is [phone].

## 2018-11-10 ENCOUNTER — Telehealth: Payer: Self-pay | Admitting: Family Medicine

## 2018-11-10 NOTE — Telephone Encounter (Signed)
Patient's wife came into the office to drop off an FL2 form to be filled out by Dr. Lorelei Pont. Placed in provider tray. Please call when ready for pick up.

## 2018-11-12 ENCOUNTER — Telehealth: Payer: Self-pay | Admitting: Family Medicine

## 2018-11-12 NOTE — Telephone Encounter (Signed)
Forms have been faxed 

## 2018-11-12 NOTE — Telephone Encounter (Signed)
Completed FL2 form and admit forms for [name] Frank, will have faxed today

## 2018-11-12 NOTE — Telephone Encounter (Signed)
Patient filling out form, once finished I will fax.

## 2018-11-12 NOTE — Telephone Encounter (Signed)
[facility] called to ask that the [facility] forms be faxed to them per the patient's request, so that she does not have to come to the office to pick them up.  Fax # [phone],  CB# [phone]

## 2018-11-13 ENCOUNTER — Ambulatory Visit (INDEPENDENT_AMBULATORY_CARE_PROVIDER_SITE_OTHER): Payer: Medicare HMO | Admitting: Psychology

## 2018-11-13 DIAGNOSIS — F4323 Adjustment disorder with mixed anxiety and depressed mood: Secondary | ICD-10-CM | POA: Diagnosis not present

## 2018-11-15 ENCOUNTER — Ambulatory Visit
Admission: RE | Admit: 2018-11-15 | Discharge: 2018-11-15 | Disposition: A | Discharge status: Home / Self Care | Payer: Medicare HMO | Source: Ambulatory Visit | Attending: Neurology | Admitting: Neurology

## 2018-11-15 ENCOUNTER — Other Ambulatory Visit: Payer: Self-pay

## 2018-11-15 ENCOUNTER — Other Ambulatory Visit: Payer: Medicare HMO

## 2018-11-15 DIAGNOSIS — R413 Other amnesia: Secondary | ICD-10-CM | POA: Diagnosis not present

## 2018-11-15 DIAGNOSIS — G919 Hydrocephalus, unspecified: Secondary | ICD-10-CM | POA: Diagnosis not present

## 2018-11-18 ENCOUNTER — Encounter: Payer: Self-pay | Admitting: Neurology

## 2018-11-18 ENCOUNTER — Ambulatory Visit (INDEPENDENT_AMBULATORY_CARE_PROVIDER_SITE_OTHER): Payer: Medicare HMO | Admitting: Neurology

## 2018-11-18 ENCOUNTER — Telehealth: Payer: Self-pay | Admitting: Neurology

## 2018-11-18 ENCOUNTER — Other Ambulatory Visit: Payer: Self-pay

## 2018-11-18 VITALS — BP 131/79 | HR 78 | Temp 98.6°F

## 2018-11-18 DIAGNOSIS — R413 Other amnesia: Secondary | ICD-10-CM

## 2018-11-18 DIAGNOSIS — S2249XA Multiple fractures of ribs, unspecified side, initial encounter for closed fracture: Secondary | ICD-10-CM | POA: Diagnosis not present

## 2018-11-18 DIAGNOSIS — G912 (Idiopathic) normal pressure hydrocephalus: Secondary | ICD-10-CM | POA: Diagnosis not present

## 2018-11-18 DIAGNOSIS — R32 Unspecified urinary incontinence: Secondary | ICD-10-CM | POA: Diagnosis not present

## 2018-11-18 DIAGNOSIS — Z8679 Personal history of other diseases of the circulatory system: Secondary | ICD-10-CM

## 2018-11-18 NOTE — Patient Instructions (Signed)
Normal-Pressure Hydrocephalus Normal pressure hydrocephalus (NPH) is a buildup of fluid (cerebrospinal fluid, CSF) inside the brain. This may or may not increase the pressure inside the brain (intracranial pressure). CSF is normally present in the brain, but when too much CSF increases pressure on the brain, it can affect brain function. NPH usually occurs in people who are older than age 66. Many symptoms of NPH are also associated with aging or certain medical conditions. It is important to pay attention to changes in behavior and mental function. What are the causes? This condition may be caused by anything that blocks the flow of CSF, such as:  Head injury.  Infections.  Brain surgery.  Bleeding from a blood vessel in the brain.  Tumors or cancer. In many cases, the cause of this condition is not known. What are the signs or symptoms? Symptoms of this condition may include:  Difficulty walking, such as: ? Shuffling feet when walking. ? Unsteadiness. ? Problems when beginning to walk. ? Feet feeling as though they are stuck or "frozen" to the floor.  Problems with bowel and bladder control.  Memory problems, such as: ? Forgetfulness. ? Lack of concentration. ? Mood problems. ? Confusion. How is this diagnosed? This condition is diagnosed based on:  Your medical history.  A physical exam. This can reveal walking (gait) changes or twitching or sudden muscle tightening (spasms) in the legs.  Other tests to confirm the diagnosis and identify the best options for treatment. These tests include: ? Removal and examination of a small amount of CSF (lumbar puncture). ? CT scan of your head. ? MRI scan of your head. How is this treated?  This condition may be treated with surgery in which a narrow tube (ventriculoperitoneal shunt, or VP shunt) is placed in the brain to drain excess CSF. After a shunt is placed, you will be monitored to make sure CSF is draining properly.  Depending on your age and overall health condition, you may not be a candidate for surgery. If your symptoms are mild, your condition may be monitored on a regular basis before a decision is made about whether a shunt is needed. Follow these instructions at home: Medicines  Take over-the-counter and prescription medicines only as told by your health care provider.  Avoid taking medicines that can affect thinking, such as pain or sleeping medicines. Lifestyle  Do not use any products that contain nicotine or tobacco, such as cigarettes and e-cigarettes. If you need help quitting, ask your health care provider.  Drink enough fluid to keep your urine clear or pale yellow. If you have a shunt:  Do not wear tight-fitting hats or headgear.  Before having an MRI or abdominal surgery, tell your health care provider that you have a shunt.  Watch for signs of infection around the shunt, such as: ? Fever. ? Redness, pain, or swelling of the skin along the shunt path. ? Headache or stiff neck. ? Nausea or vomiting. General instructions  Work with your health care provider to determine what you need help with and what your safety needs are.  Ask your health care provider when you can resume your normal activities.  Keep all follow-up visits as told by your health care provider. This is important. Contact a health care provider if:  You have any new symptoms.  Your symptoms get worse.  Your symptoms do not improve after surgery. Get help right away if:  You have: ? A fever or other signs of infection. ?   New or worsening confusion. ? New or worsening sleepiness. ? A hard time staying awake. ? A headache that is getting worse.  You start to twitch or shake (seizure).  You lose coordination or balance.  Your or your family members become concerned for your safety. Summary  Normal pressure hydrocephalus (NPH) is a buildup of fluid (cerebrospinal fluid, CSF) inside the brain. Too  much CSF can put pressure on the brain and affect its function.  Anything that blocks the flow of CSF can cause this condition. In some cases, the cause of this condition is not known.  Depending on your age, overall health condition, and diagnostic tests, you may be a good candidate for a shunt to drain excess CSF. This information is not intended to replace advice given to you by your health care provider. Make sure you discuss any questions you have with your health care provider. Document Released: 07/13/2013 Document Revised: 07/16/2017 Document Reviewed: 03/29/2016 Elsevier Patient Education  2020 [employer]  

## 2018-11-18 NOTE — Telephone Encounter (Signed)
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   There has been resolution of the subdural hematoma noted on the left on the 07/29/2018 MRI. 2.   There is ventriculomegaly out of proportion to the extent of mild generalized cortical atrophy.   Additionally, there appears to be mild transependymal flow.  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   There are no acute findings.

## 2018-11-18 NOTE — Progress Notes (Signed)
PATIENT: [name] Frank DOB: 04-Jul-1950  Chief Complaint  Patient presents with  . Hydrocephalus/Seizure    He is here with his wife, [name] Frank.  They would like to review his MRI results.  No seizure activity reported.  They have reviewed his medication list but are unsure exactly what is in his pill box at home.      HISTORICAL  [name] Frank is a 68 year old male, seen in request by his primary care physician Dr. Lamar Blinks for evaluation of possible seizure, history of subdural hematoma, initial evaluation was on October 09, 2018.  I have reviewed and summarized the referring note from the referring physician.  He had a past medical history of hypertension, type 2 diabetes, coronary artery disease, paroxysmal atrial fibrillation, he had an unwitnessed fall at the bottom of the stairs, was brought to the emergency room on Jul 14, 2018, I personally reviewed CT head, acute subdural hematoma along the left cerebral convexity measuring up to 11 mm in thickness, patchy subarachnoid hemorrhage with a traumatic pattern, negative for cervical spine fracture.  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  He denies gait abnormality, memory loss at that time.  He readmitted to the hospital on Jul 28, 2018 for more somnolent, slept all day on May 16 sudden onset garbled speech, right facial droop, transient confusion, emergency room blood pressure 90/53, CT head showed developing community hydrocephalus, likely due to recent subarachnoid and intraventricular hemorrhage, no surgical intervention was indicated per neurosurgery evaluation, suspicious for complex partial seizure, he was discharged with Keppra  500 mg twice a day  Since recent hospital admission, there was no clinical seizure, but he was noted significant memory loss, he states, repeating questions, slept a lot, lost control of his urine, also has mild gait abnormality,  He is always a slow walker, he tends to wait on him, by reviewing the initial CT scan on Jul 14, 2018, there is already some evidence of hydrocephalus, enlarged bilateral lateral ventricle,  I personally reviewed MRI of the brain on Jul 29, 2018, persistent ventriculomegaly, suspected developing communicating hydrocephalus, no evidence of acute stroke, 10 mm left subdural hematoma, with no mass-effect on the hemisphere,  Laboratory evaluations showed normal ESR, C-reactive protein, ANA, HIV, RPR, B12, A1c was 7.2, hemoglobin was 12.8,   Update November 19, 2018: He continues to have significant memory loss, gait abnormality, worsening urinary incontinence, this is all new since May 2020, he retired as an Engineer, 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[employer], later worked as a Microbiologist, just finished a complicated project in April 2020, now he has difficulty operating coffee machine, difficulty initiating his gait, fell few times,  We have personally reviewed MRI CT scans, compared with multiple scans he had a since May 2020, most recent MRI brain November 15, 2018: Resolution of left subdural hematoma, ventriculomegaly out of proportion to the extent of mild generalized cortical atrophy, additionally there appears to be mild transependymal flow, 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 Zada Finders, will see him on September 9  He is tolerating Keppra 500 mg twice daily well, there was no recurrent seizure   He has tried Sinemet 25/100 mg 3 times daily, there was no  significant improvement noted  REVIEW OF SYSTEMS: Full 14 system review of systems performed and notable only for as above All other review of systems were negative.  ALLERGIES: No Known Allergies  HOME MEDICATIONS: Current Outpatient Medications  Medication Sig Dispense Refill  . acetaminophen (TYLENOL) 325 MG tablet Take 2 tablets (650 mg total) by mouth every 6 (six) hours as needed for mild pain.    Marland Kitchen atorvastatin (LIPITOR) 80 MG tablet Take 1 tablet (80 mg total) by mouth daily. (Patient taking differently: Take 80 mg by mouth daily. ) 30 tablet 1  . carbidopa-levodopa (SINEMET IR) 25-100 MG tablet Take 1 tablet by mouth 3 (three) times daily. 90 tablet 6  . docusate sodium (COLACE) 100 MG capsule Take 1 capsule (100 mg total) by mouth 2 (two) times daily. For constipation (Patient taking differently: Take 100 mg by mouth at bedtime. For constipation) 60 capsule 0  . glipiZIDE (GLUCOTROL XL) 10 MG 24 hr tablet Take 1 tablet (10 mg total) by mouth 2 (two) times daily. (Patient taking differently: Take 10 mg by mouth 2 (two) times daily. ) 60 tablet 1  . HYDROcodone-acetaminophen (NORCO) 10-325 MG tablet Take 1-2 tablets by mouth every 4 (four) hours as needed for moderate pain. Maximum dose per 24 hours - 8 pills 20 tablet 0  . levETIRAcetam (KEPPRA) 500 MG tablet Take 1 tablet (500 mg total) by mouth 2 (two) times daily. (Patient taking differently: Take 500 mg by mouth 2 (two) times daily. ) 60 tablet 11  . metoprolol succinate (TOPROL-XL) 100 MG 24 hr tablet Take 1 tablet (100 mg total) by mouth daily. Take with or immediately following a meal. 30 tablet 0  . tamsulosin (FLOMAX) 0.4 MG CAPS capsule Take 1 capsule (0.4 mg total) by mouth daily after supper. (Patient taking differently: Take 0.4 mg by mouth daily after supper. ) 30 capsule 1   No current facility-administered medications for this visit.     PAST MEDICAL HISTORY: Past Medical History:  Diagnosis Date  . Abnormality of  gait    uses walker and wheelchair  . Cognitive deficit as late effect of traumatic brain injury ([place])   . Coronary artery disease    cardiologist-- dr s. Jake Bathe---  04-06-2014  NSTEMI  s/p  cardiac cath DES x1 to LAD and POBA to D1  . History of non-ST elevation myocardial infarction (NSTEMI)    04-06-2013  s/p  coronary stent and PCI  . Hydrocele, bilateral   . Hyperlipemia   . Hypertension   . Lower urinary tract symptoms (LUTS)   . Memory deficit   . PAF (paroxysmal atrial fibrillation) ([place]) cardilogist-- dr Jake Bathe   on-set 05/ 2020 during admission for [facility],  w/ RVR,  with cardizem/ toprol, back in [facility],  f/u by cardiologist , event monitor 09-16-2018 no results in epic by per pt wife was told result ok with no afib  . S/P drug eluting coronary stent placement    04-06-2014  '@[facility]'$   DES x1 to LAD and POBA to D1  . SAH (subarachnoid hemorrhage) [facility]) neurologist-  dr Krista Blue--- last head CT 10-01-2018 resolving (10-23-2018  residual deficits memory issues, abnormal gait, congnitive   w/ SDH due to unwitnessed fall, traumatic head injury---- admission 07-14-2018 , d/c'd 08-01-2018,  readmitted 3 days later with late TBI effects,  d/c'd from rehab 08-22-2018  . Seizure disorder [facility])    followed by dr Krista Blue  . Subdural hematoma ([place])   . Type 2 diabetes mellitus ([place])    followed by pcp  . 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 CATARACT EXTRACTION W/ INTRAOCULAR LENS  IMPLANT, BILATERAL  2008; 2009  . CORONARY ANGIOPLASTY WITH STENT PLACEMENT  04-06-2014  _0    DES x1 to LAD and POBA to D1  . HYDROCELE EXCISION Bilateral 10/27/2018   Procedure: HYDROCELECTOMY ADULT;  Surgeon: [name], MD;  Location: [facility];  Service: Urology;  Laterality: Bilateral;  . TONSILLECTOMY  child    FAMILY HISTORY: Family History  Problem Relation Age of Onset  . Stroke Father   .  Hypertension Father   . Hyperlipidemia Father   . Early death Mother   . Colitis Sister   . Appendicitis Brother     SOCIAL HISTORY: Social History   Socioeconomic History  . Marital status: Married    Spouse name: Not on file  . Number of children: 2  . Years of education: college  . Highest education level: Master's degree (e.g., MA, MS, MEng, MEd, MSW, MBA)  Occupational History  . Occupation: Retired  Scientific laboratory technician  . Financial resource strain: Not on file  . Food insecurity    Worry: Not on file    Inability: Not on file  . Transportation needs    Medical: Not on file    Non-medical: Not on file  Tobacco Use  . Smoking status: Never Smoker  . Smokeless tobacco: Never Used  Substance and Sexual Activity  . Alcohol use: Not Currently    Comment: VERY RARELY  . Drug use: Never  . Sexual activity: Not on file  Lifestyle  . Physical activity    Days per week: Not on file    Minutes per session: Not on file  . Stress: Not on file  Relationships  . Social Herbalist on phone: Not on file    Gets together: Not on file    Attends religious service: Not on file    Active member of club or organization: Not on file    Attends meetings of clubs or organizations: Not on file    Relationship status: Not on file  . Intimate partner violence    Fear of current or ex partner: Not on file    Emotionally abused: Not on file    Physically abused: Not on file    Forced sexual activity: Not on file  Other Topics Concern  . Not on file  Social History Narrative   Lives at home with his wife.   Right-handed.   Limited caffeine use.     PHYSICAL EXAM   Vitals:   11/18/18 1347  BP: 131/79  Pulse: 78  Temp: 98.6 F (37 C)    Not recorded      There is no height or weight on file to calculate BMI.  PHYSICAL EXAMNIATION:  Gen: NAD, conversant, well nourised,well groomed                     Cardiovascular: Regular rate rhythm, no peripheral edema, warm,  nontender. Eyes: Conjunctivae clear without exudates or hemorrhage Neck: Supple, no carotid bruits. Pulmonary: Clear to auscultation bilaterally   NEUROLOGICAL EXAM:  MENTAL STATUS:  Speech/speech: Rely on his wife to provide most history, alert, following command CRANIAL NERVES: CN II: Visual fields are full to confrontation.  Pupils are round equal and briskly reactive to light. CN III, IV, VI: extraocular movement are normal. No ptosis. CN V: Facial sensation is intact to pinprick in all 3 divisions bilaterally. Corneal responses are intact.  CN VII: Face is symmetric with normal eye closure and smile. CN VIII: Hearing is normal to casual conversation CN IX, X: Palate elevates symmetrically. Phonation is normal. CN XI: Head turning and shoulder shrug are intact CN XII: Tongue is midline with normal movements and no atrophy.  MOTOR: Normal strength, moderate bilateral upper nuchal rigidity, bradykinesia,  REFLEXES: Reflexes are 1 and symmetric at the biceps, triceps, knees, and ankles. Plantar responses are flexor.  SENSORY: Intact to light touch,  COORDINATION: Rapid alternating movements and fine finger movements are intact. There is no dysmetria on finger-to-nose and heel-knee-shin.    GAIT/STANCE: Need push-up to get up from seated position, difficulty initiate gait, magnetic gait, unsteady, wide-based, leaning forward  DIAGNOSTIC DATA (LABS, IMAGING, TESTING) - I reviewed patient records, labs, notes, testing and imaging myself where available.   ASSESSMENT AND PLAN  PRASHANT GLOSSER is a 68 y.o. 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 Ph.D.  [facility] [address] [place], [place] [address] Ph: [phone] Fax: [phone]  CC: Copland, Gay Filler, MD

## 2018-11-19 ENCOUNTER — Ambulatory Visit (INDEPENDENT_AMBULATORY_CARE_PROVIDER_SITE_OTHER): Payer: Medicare HMO | Admitting: Psychology

## 2018-11-19 ENCOUNTER — Other Ambulatory Visit: Payer: Self-pay | Admitting: Neurological Surgery

## 2018-11-19 DIAGNOSIS — F4323 Adjustment disorder with mixed anxiety and depressed mood: Secondary | ICD-10-CM | POA: Diagnosis not present

## 2018-11-19 DIAGNOSIS — S065X9A Traumatic subdural hemorrhage with loss of consciousness of unspecified duration, initial encounter: Secondary | ICD-10-CM | POA: Diagnosis not present

## 2018-11-19 DIAGNOSIS — G912 (Idiopathic) normal pressure hydrocephalus: Secondary | ICD-10-CM | POA: Diagnosis not present

## 2018-11-20 ENCOUNTER — Ambulatory Visit: Payer: Medicare HMO | Admitting: Occupational Therapy

## 2018-11-20 ENCOUNTER — Other Ambulatory Visit: Payer: Self-pay

## 2018-11-20 ENCOUNTER — Ambulatory Visit: Payer: Medicare HMO | Admitting: Speech Pathology

## 2018-11-20 ENCOUNTER — Ambulatory Visit: Payer: Medicare HMO | Admitting: Rehabilitative and Restorative Service Providers"

## 2018-11-21 DIAGNOSIS — S065X9A Traumatic subdural hemorrhage with loss of consciousness of unspecified duration, initial encounter: Secondary | ICD-10-CM | POA: Diagnosis not present

## 2018-11-27 ENCOUNTER — Ambulatory Visit (INDEPENDENT_AMBULATORY_CARE_PROVIDER_SITE_OTHER): Payer: Medicare HMO | Admitting: Psychology

## 2018-11-27 DIAGNOSIS — F4323 Adjustment disorder with mixed anxiety and depressed mood: Secondary | ICD-10-CM

## 2018-11-27 NOTE — Progress Notes (Signed)
[facility] [place], [place] - [address] [place] [address] Phone: [phone] Fax: [phone]  [facility] - [place], [place] - [address] [place] [id number] Phone: [phone] Fax: [phone]      Your procedure is scheduled on September 22nd.  Report to [facility] Main Entrance "A" at 12:45 A.M., and check in at the Admitting office.  Call this number if you have problems the morning of surgery:  [phone]  Call [phone] if you have any questions prior to your surgery date Monday-Friday 8am-4pm    Remember:  Do not eat or drink after midnight the night before your surgery     Take these medicines the morning of surgery with A SIP OF WATER   Tylenol - if needed  Atorvastatin (Lipitor) - if needed  Carbidopa-Levodopa (Sinemet)  Colace  Hydrocodone-Acetaminophen  Levetiracetam (Keppra)  Metoprolol   7 days prior to surgery STOP taking any Aspirin (unless otherwise instructed by your surgeon), Aleve, Naproxen, Ibuprofen, Motrin, Advil, Goody's, BC's, all herbal medications, fish oil, and all vitamins.   WHAT DO I DO ABOUT MY DIABETES MEDICATION?   Marland Kitchen. Do not take oral diabetes medicines (pills) the morning of surgery. - Glipizide  . THE NIGHT BEFORE SURGERY, do NOT take evening dose of Glipizide      How to Manage Your Diabetes Before and After Surgery  Why is it important to control my blood sugar before and after surgery? . Improving blood sugar levels before and after surgery helps healing and can limit problems. . A way of improving blood sugar control is eating a healthy diet by: o  Eating less sugar and carbohydrates o  Increasing activity/exercise o  Talking with your doctor about reaching your blood sugar goals . High blood sugars (greater than 180 mg/dL) can raise your risk of infections and slow your recovery, so you will need  to focus on controlling your diabetes during the weeks before surgery. . Make sure that the doctor who takes care of your diabetes knows about your planned surgery including the date and location.  How do I manage my blood sugar before surgery? . Check your blood sugar at least 4 times a day, starting 2 days before surgery, to make sure that the level is not too high or low. o Check your blood sugar the morning of your surgery when you wake up and every 2 hours until you get to the Short Stay unit. . If your blood sugar is less than 70 mg/dL, you will need to treat for low blood sugar: o Do not take insulin. o Treat a low blood sugar (less than 70 mg/dL) with  cup of clear juice (cranberry or apple), 4 glucose tablets, OR glucose gel. o Recheck blood sugar in 15 minutes after treatment (to make sure it is greater than 70 mg/dL). If your blood sugar is not greater than 70 mg/dL on recheck, call [phone] for further instructions. . Report your blood sugar to the short stay nurse when you get to Short Stay.  . If you are admitted to the hospital after surgery: o Your blood sugar will be checked by the staff and you will probably be given insulin after surgery (instead of oral diabetes medicines) to make sure you have good blood sugar levels. o The goal for blood sugar control after surgery is 80-180 mg/dL.  The Morning of Surgery  Do not wear jewelry.  Do not wear lotions, powders, colognes, or deodorant  Men may shave face and neck.  Do not bring valuables to the hospital.  [facility] is not responsible for any belongings or valuables.  If you are a smoker, DO NOT Smoke 24 hours prior to surgery IF you wear a CPAP at night please bring your mask, tubing, and machine the morning of surgery   Remember that you must have someone to transport you home after your surgery, and remain with you for 24 hours if you are discharged the same day.   Contacts, glasses, hearing aids, dentures  or bridgework may not be worn into surgery.    Leave your suitcase in the car.  After surgery it may be brought to your room.  For patients admitted to the hospital, discharge time will be determined by your treatment team.  Patients discharged the day of surgery will not be allowed to drive home.    Special instructions:   [facility]- Preparing For Surgery  Before surgery, you can play an important role. Because skin is not sterile, your skin needs to be as free of germs as possible. You can reduce the number of germs on your skin by washing with CHG (chlorahexidine gluconate) Soap before surgery.  CHG is an antiseptic cleaner which kills germs and bonds with the skin to continue killing germs even after washing.    Oral Hygiene is also important to reduce your risk of infection.  Remember - BRUSH YOUR TEETH THE MORNING OF SURGERY WITH YOUR REGULAR TOOTHPASTE  Please do not use if you have an allergy to CHG or antibacterial soaps. If your skin becomes reddened/irritated stop using the CHG.  Do not shave (including legs and underarms) for at least 48 hours prior to first CHG shower. It is OK to shave your face.  Please follow these instructions carefully.   1. Shower the NIGHT BEFORE SURGERY and the MORNING OF SURGERY with CHG Soap.   2. If you chose to wash your hair, wash your hair first as usual with your normal shampoo.  3. After you shampoo, rinse your hair and body thoroughly to remove the shampoo.  4. Use CHG as you would any other liquid soap. You can apply CHG directly to the skin and wash gently with a scrungie or a clean washcloth.   5. Apply the CHG Soap to your body ONLY FROM THE NECK DOWN.  Do not use on open wounds or open sores. Avoid contact with your eyes, ears, mouth and genitals (private parts). Wash Face and genitals (private parts)  with your normal soap.   6. Wash thoroughly, paying special attention to the area where your surgery will be  performed.  7. Thoroughly rinse your body with warm water from the neck down.  8. DO NOT shower/wash with your normal soap after using and rinsing off the CHG Soap.  9. Pat yourself dry with a CLEAN TOWEL.  10. Wear CLEAN PAJAMAS to bed the night before surgery, wear comfortable clothes the morning of surgery  11. Place CLEAN SHEETS on your bed the night of your first shower and DO NOT SLEEP WITH PETS.    Day of Surgery:  Do not apply any deodorants/lotions. Please shower the morning of surgery with the CHG soap  Please wear clean clothes to the hospital/surgery center.   Remember to brush your teeth WITH YOUR REGULAR TOOTHPASTE.   Please read over the  following fact sheets that you were given.

## 2018-11-28 ENCOUNTER — Telehealth: Payer: Self-pay

## 2018-11-28 ENCOUNTER — Other Ambulatory Visit: Payer: Self-pay

## 2018-11-28 ENCOUNTER — Encounter (HOSPITAL_COMMUNITY)
Admission: RE | Admit: 2018-11-28 | Discharge: 2018-11-28 | Disposition: A | Discharge status: Home / Self Care | Payer: Medicare HMO | Source: Ambulatory Visit | Attending: Neurological Surgery | Admitting: Neurological Surgery

## 2018-11-28 ENCOUNTER — Encounter (HOSPITAL_COMMUNITY): Payer: Self-pay

## 2018-11-28 ENCOUNTER — Other Ambulatory Visit (HOSPITAL_COMMUNITY)
Admission: RE | Admit: 2018-11-28 | Discharge: 2018-11-28 | Disposition: A | Discharge status: Home / Self Care | Payer: Medicare HMO | Source: Ambulatory Visit

## 2018-11-28 DIAGNOSIS — I480 Paroxysmal atrial fibrillation: Secondary | ICD-10-CM | POA: Insufficient documentation

## 2018-11-28 DIAGNOSIS — I2510 Atherosclerotic heart disease of native coronary artery without angina pectoris: Secondary | ICD-10-CM | POA: Diagnosis not present

## 2018-11-28 DIAGNOSIS — E119 Type 2 diabetes mellitus without complications: Secondary | ICD-10-CM

## 2018-11-28 DIAGNOSIS — I10 Essential (primary) hypertension: Secondary | ICD-10-CM | POA: Diagnosis not present

## 2018-11-28 DIAGNOSIS — Z7984 Long term (current) use of oral hypoglycemic drugs: Secondary | ICD-10-CM | POA: Diagnosis not present

## 2018-11-28 DIAGNOSIS — Z01812 Encounter for preprocedural laboratory examination: Secondary | ICD-10-CM | POA: Diagnosis not present

## 2018-11-28 DIAGNOSIS — Z20828 Contact with and (suspected) exposure to other viral communicable diseases: Secondary | ICD-10-CM | POA: Diagnosis not present

## 2018-11-28 HISTORY — DX: Cardiac arrhythmia, unspecified: I49.9

## 2018-11-28 LAB — TYPE AND SCREEN
ABO/RH(D): A NEG
Antibody Screen: NEGATIVE

## 2018-11-28 LAB — GLUCOSE, CAPILLARY: Glucose-Capillary: 361 mg/dL — ABNORMAL HIGH (ref 70–99)

## 2018-11-28 LAB — CBC
HCT: 45.3 % (ref 39.0–52.0)
Hemoglobin: 14.5 g/dL (ref 13.0–17.0)
MCH: 28.5 pg (ref 26.0–34.0)
MCHC: 32 g/dL (ref 30.0–36.0)
MCV: 89.2 fL (ref 80.0–100.0)
Platelets: 176 10*3/uL (ref 150–400)
RBC: 5.08 MIL/uL (ref 4.22–5.81)
RDW: 12.8 % (ref 11.5–15.5)
WBC: 4.7 10*3/uL (ref 4.0–10.5)
nRBC: 0 % (ref 0.0–0.2)

## 2018-11-28 LAB — BASIC METABOLIC PANEL
Anion gap: 8 (ref 5–15)
BUN: 20 mg/dL (ref 8–23)
CO2: 25 mmol/L (ref 22–32)
Calcium: 9.2 mg/dL (ref 8.9–10.3)
Chloride: 102 mmol/L (ref 98–111)
Creatinine, Ser: 0.71 mg/dL (ref 0.61–1.24)
GFR calc Af Amer: >60 mL/min (ref >60)
GFR calc non Af Amer: >60 mL/min (ref >60)
Glucose, Bld: 316 mg/dL — ABNORMAL HIGH (ref 70–99)
Potassium: 4.2 mmol/L (ref 3.5–5.1)
Sodium: 135 mmol/L (ref 135–145)

## 2018-11-28 LAB — ABO/RH: ABO/RH(D): A NEG

## 2018-11-28 LAB — HEMOGLOBIN A1C
Hgb A1c MFr Bld: 9.6 % — ABNORMAL HIGH (ref 4.8–5.6)
Mean Plasma Glucose: 228.82 mg/dL

## 2018-11-28 MED ORDER — GLIPIZIDE ER 10 MG PO TB24: 10.0000 mg | ORAL_TABLET | Freq: Two times a day (BID) | ORAL | 1 refills | Status: DC

## 2018-11-28 MED ORDER — CARBIDOPA-LEVODOPA 25-100 MG PO TABS: 1.0000 | ORAL_TABLET | Freq: Three times a day (TID) | ORAL | 6 refills | Status: DC

## 2018-11-28 MED ORDER — METOPROLOL SUCCINATE ER 100 MG PO TB24: 100.0000 mg | ORAL_TABLET | Freq: Every day | ORAL | 5 refills | Status: DC

## 2018-11-28 MED ORDER — LEVETIRACETAM 500 MG PO TABS: 500.0000 mg | ORAL_TABLET | Freq: Two times a day (BID) | ORAL | 11 refills | Status: DC

## 2018-11-28 NOTE — Progress Notes (Signed)
Concerns noted during PAT visit today re: patient and wife statement that patient has been without ALL of his medications (except for Doxycycline) for the last three weeks.  CBG was 361 during PAT visit.  Dr. Krista Blue visit from 11/18/2018 noted patient taking Keppra 584m BID; however, patient and wife both deny that he was taking Keppra at the time due to issues w/[employer] insurance coverage of meds.  Both patient and wife are agitated by the insurance issues and state their daughter has been trying to assist them with getting their medications thru [employer].  Despite, anesthesia was consulted.  JJeneen Rinks PA-C met with the patient and wife during PAT visit and expressed concerns re: blood sugars being elevated and potential seizures potentially cancelling surgery next week.    Dr. CLillie Fragminoffice was contacted to notify of lack of meds and urgent request for an order for stopgap medications to be ordered thru the patient's local pharmacy.  BMel Almond assistant to Dr. CLorelei Pont assured this RN that [facility] this afternoon.  Patient and wife were both directed to pick up patient RX today.  Also instructed to check patient blood sugars over the weekend and to call the PAT office on Monday morning if CBG remains greater than 250.  All further questions need to be directed to [facility].

## 2018-11-28 NOTE — Telephone Encounter (Signed)
I have spoke with Tammy, as this is an urgent message. Message did not get to me-Dr. Copland I know you are not in today. I have refilled medicine as this is an urgent request. Prior to surgery he MUST be taking these medications prior to procedure for a stop gap. Please advise on any changes and advice on Blood Sugar reading.   Callback number [phone]

## 2018-11-28 NOTE — Telephone Encounter (Signed)
-----   Message from Thressa Sheller, RN sent at 11/28/2018  2:41 PM EDT ----- Regarding: Patient Concerns FYI -   This patient came into our office this afternoon for a PAT visit prior to a right VP shunt placement on 12/02/18.  We were quite concerned when the patient and wife both stated that patient has not had ANY of his medications for the past three weeks d/t an issue w/[employer] accepting their request for mail-in medications.  According to wife, Mcarthur Rossetti claims they have no orders for his meds??  Their daughter has been handling the situation w/[employer].  There are certainly medications which we are most concerned about him taking, most specifically Metoprolol, Keppa, Glucotrol, and Sinemet.  His CBG today in the office was 361.  We encouraged them to call you after leaving to get RX called in to their local [facility], at least for emergency doses until they can get everything sorted w/[employer].  Anesthesia will see today to advise whether we have to cancel or proceed w/surgery.  Thanks,

## 2018-11-28 NOTE — Progress Notes (Signed)
[facility] - [place], [address] [place] [address] Phone: [phone] Fax: [facility] - [place], [place] [address] Phone: [phone] Fax: [phone]      Your procedure is scheduled on September 22nd.  [facility] Main Entrance "A" at 12:45 P.M., and check in at the Admitting office.  Call this number if you have problems the morning of surgery:  [phone]  Call [phone] if you have any questions prior to your surgery date Monday-Friday 8am-4pm    Remember:  Do not eat or drink after midnight the night before your surgery     Take these medicines the morning of surgery with A SIP OF WATER   Tylenol - if needed  Atorvastatin (Lipitor)   Carbidopa-Levodopa (Sinemet)  Levetiracetam (Keppra)  Metoprolol succinate (Toprol-XL)   7 days prior to surgery STOP taking any Aspirin (unless otherwise instructed by your surgeon), Aleve, Naproxen, Ibuprofen, Motrin, Advil, Goody's, BC's, all herbal medications, fish oil, and all vitamins.   WHAT DO I DO ABOUT MY DIABETES MEDICATION?   Marland Kitchen Do not take oral diabetes medicines (pills) the morning of surgery. - Glipizide (Glucotrol)  . THE NIGHT BEFORE SURGERY, do NOT take evening dose of Glipizide (Glucotrol)      How to Manage Your Diabetes Before and After Surgery  Why is it important to control my blood sugar before and after surgery? . Improving blood sugar levels before and after surgery helps healing and can limit problems. . A way of improving blood sugar control is eating a healthy diet by: o  Eating less sugar and carbohydrates o  Increasing activity/exercise o  Talking with your doctor about reaching your blood sugar goals . High blood sugars (greater than 180 mg/dL) can raise your risk of infections and slow your recovery, so you will need to  focus on controlling your diabetes during the weeks before surgery. . Make sure that the doctor who takes care of your diabetes knows about your planned surgery including the date and location.  How do I manage my blood sugar before surgery? . Check your blood sugar at least 4 times a day, starting 2 days before surgery, to make sure that the level is not too high or low. o Check your blood sugar the morning of your surgery when you wake up and every 2 hours until you get to the Short Stay unit. . If your blood sugar is less than 70 mg/dL, you will need to treat for low blood sugar: o Do not take insulin. o Treat a low blood sugar (less than 70 mg/dL) with  cup of clear juice (cranberry or apple), 4 glucose tablets, OR glucose gel. o Recheck blood sugar in 15 minutes after treatment (to make sure it is greater than 70 mg/dL). If your blood sugar is not greater than 70 mg/dL on recheck, call [phone] for further instructions. . Report your blood sugar to the short stay nurse when you get to Short Stay.  . If you are admitted to the hospital after surgery: o Your blood sugar will be checked by the staff and you will probably be given insulin after surgery (instead of oral diabetes medicines) to make sure you have good blood sugar levels. o The goal for blood sugar control after surgery is 80-180 mg/dL.    The  Morning of Surgery  Do not wear jewelry.  Do not wear lotions, powders, colognes, or deodorant  Men may shave face and neck.  Do not bring valuables to the hospital.  [facility] is not responsible for any belongings or valuables.  IF you are a smoker, DO NOT Smoke 24 hours prior to surgery  IF you wear a CPAP at night please bring your mask, tubing, and machine the morning of surgery   Remember that you must have someone to transport you home after your surgery, and remain with you for 24 hours if you are discharged the same day.   Contacts, eyeglasses, hearing aids, dentures  or bridgework may not be worn into surgery.    Leave your suitcase in the car.  After surgery it may be brought to your room.  For patients admitted to the hospital, discharge time will be determined by your treatment team.  Patients discharged the day of surgery will not be allowed to drive home.    Special instructions:   [facility]- Preparing For Surgery  Before surgery, you can play an important role. Because skin is not sterile, your skin needs to be as free of germs as possible. You can reduce the number of germs on your skin by washing with CHG (chlorahexidine gluconate) Soap before surgery.  CHG is an antiseptic cleaner which kills germs and bonds with the skin to continue killing germs even after washing.    Oral Hygiene is also important to reduce your risk of infection.  Remember - BRUSH YOUR TEETH THE MORNING OF SURGERY WITH YOUR REGULAR TOOTHPASTE  Please do not use if you have an allergy to CHG or antibacterial soaps. If your skin becomes reddened/irritated stop using the CHG.  Do not shave (including legs and underarms) for at least 48 hours prior to first CHG shower. It is OK to shave your face.  Please follow these instructions carefully.   1. Shower the NIGHT BEFORE SURGERY and the MORNING OF SURGERY with CHG Soap.   2. If you chose to wash your hair, wash your hair first as usual with your normal shampoo.  3. After you shampoo, rinse your hair and body thoroughly to remove the shampoo.  4. Use CHG as you would any other liquid soap. You can apply CHG directly to the skin and wash gently with a scrungie or a clean washcloth.   5. Apply the CHG Soap to your body ONLY FROM THE NECK DOWN.  Do not use on open wounds or open sores. Avoid contact with your eyes, ears, mouth and genitals (private parts). Wash Face and genitals (private parts)  with your normal soap.   6. Wash thoroughly, paying special attention to the area where your surgery will be  performed.  7. Thoroughly rinse your body with warm water from the neck down.  8. DO NOT shower/wash with your normal soap after using and rinsing off the CHG Soap.  9. Pat yourself dry with a CLEAN TOWEL.  10. Wear CLEAN PAJAMAS to bed the night before surgery, wear comfortable clothes the morning of surgery  11. Place CLEAN SHEETS on your bed the night of your first shower and DO NOT SLEEP WITH PETS.    Day of Surgery:  Please shower the morning of surgery with the CHG soap  Do not apply any deodorants/lotions. Please wear clean clothes to the hospital/surgery center.   Remember to brush your teeth WITH YOUR REGULAR TOOTHPASTE.   Please read over the  following fact sheets that you were given.

## 2018-11-28 NOTE — Progress Notes (Addendum)
PCP - Dr. Edilia Bo Cardiologist - Dr. Marijo File Neurologist: Dr. Krista Blue  Chest x-ray - 07/18/2018 EKG - 08/04/2018 Stress Test - patient and wife deny ECHO - patient and wife deny Cardiac Cath - 2016  Sleep Study - patient and wife deny CPAP -   Fasting Blood Sugar - unsure Checks Blood Sugar 0 times a day, "maybe once a month, if that" Patient's CBG was 361 upon arrival to PAT appointment.  Patient last ate at 1130am.  He has not taken his DM medication in about 3 weeks due to having difficulty getting the medication through his mail order pharmacy.  Patient educated about the importance of having CBG under control  Blood Thinner Instructions: Aspirin Instructions:  Anesthesia review: yes, hx of CAD, PAF, CBG 361 at PAT, patient has been off meds other than Doxycycline for 3 weeks due to issues with mail order pharmacy - See Tammy's note regarding calling patient's PCP and having PCP call in new prescriptions for patient to restart today.  Patient denies shortness of breath, fever, cough and chest pain at PAT appointment  Coronavirus Screening  Have you experienced the following symptoms:  Cough yes/no: No Fever (>100.23F)  yes/no: No Runny nose: No Sore throat yes/no: No Difficulty breathing/shortness of breath  yes/no: No  Have you or a family member traveled in the last 14 days and where? yes/no: No   If the patient indicates "YES" to the above questions, their PAT will be rescheduled to limit the exposure to others and, the surgeon will be notified. THE PATIENT WILL NEED TO BE ASYMPTOMATIC FOR 14 DAYS.   If the patient is not experiencing any of these symptoms, the PAT nurse will instruct them to NOT bring anyone with them to their appointment since they may have these symptoms or traveled as well.   Please remind your patients and families that hospital visitation restrictions are in effect and the importance of the restrictions.    Patient verbalized understanding of  instructions that were given to them at the PAT appointment. Patient was also instructed that they will need to review over the PAT instructions again at home before surgery.  Patient will need wife to be present in short stay on the day of surgery due to cognitive/memory impairment.  This has been discussed with Rolla Flatten.

## 2018-11-28 NOTE — Anesthesia Preprocedure Evaluation (Addendum)
Anesthesia Evaluation  Patient identified by MRN, date of birth, ID band Patient awake    Reviewed: Allergy & Precautions, NPO status , Patient's Chart, lab work & pertinent test results  Airway Mallampati: II  TM Distance: >3 FB Neck ROM: Full    Dental  (+) Teeth Intact, Dental Advisory Given   Pulmonary neg pulmonary ROS, neg recent URI,    Pulmonary exam normal breath sounds clear to auscultation       Cardiovascular hypertension, Pt. on medications and Pt. on home beta blockers (-) angina+ CAD, + Past MI and + Cardiac Stents  Normal cardiovascular exam+ dysrhythmias Atrial Fibrillation  Rhythm:Regular Rate:Bradycardia  2016  DES x1 to LAD   Neuro/Psych Seizures -,  Normal pressure hydrocephalus, SAH negative psych ROS   GI/Hepatic   Endo/Other  diabetes, Type 2, Oral Hypoglycemic Agents  Renal/GU      Musculoskeletal   Abdominal   Peds  Hematology negative hematology ROS (+)   Anesthesia Other Findings tte 07/2018:  1. The left ventricle has normal systolic function, with an ejection fraction of 55-60%. The cavity size was normal. Left ventricular diastolic function could not be evaluated secondary to atrial fibrillation.  2. The right ventricle has normal systolic function. The cavity was normal. There is no increase in right ventricular wall thickness.  3. No stenosis of the aortic valve.  4. The aortic root and ascending aorta are normal in size and structure.  Reproductive/Obstetrics                           Anesthesia Physical Anesthesia Plan  ASA: III  Anesthesia Plan: General   Post-op Pain Management:    Induction: Intravenous  PONV Risk Score and Plan: 2 and Ondansetron and Dexamethasone  Airway Management Planned: Oral ETT  Additional Equipment: None  Intra-op Plan:   Post-operative Plan: Extubation in OR  Informed Consent: I have reviewed the patients History  and Physical, chart, labs and discussed the procedure including the risks, benefits and alternatives for the proposed anesthesia with the patient or authorized representative who has indicated his/her understanding and acceptance.     Dental advisory given  Plan Discussed with: CRNA and Surgeon  Anesthesia Plan Comments: (See recent PAT note by Konrad Felix, PA-C 10/24/18. He continues to have significant memory loss, gait abnormality, worsening urinary incontinence, this is all new since May 2020 when he suffered TBI after unwitnessed fall/possible seizure.  Pt reported at PAT appointment that he has been out of all his medications for approx 3 weeks. He and his wife state that they had recently switched to [facility] mail order and never received meds. They did not contact PCP or Neurologist for refills. PAT nurse called PCP office during appointment and advised of the situation. Pt's PCP Dr. Lorelei Pont was out of the office but on call provider agreed to send refills of all medications to be picked up immediately. Pt and wife were informed and advised to pick them up and restart asap. Pt's CBG was 361 mg/dl, uncontrolled due to being out of antiDM meds. A1c 9.6.  Pt has restarted meds. Dr. Zada Finders made aware. Message also sent to neurologist Dr. Krista Blue to make her aware.  Echo 07/18/2018 IMPRESSIONS 1. The left ventricle has normal systolic function, with an ejection fraction of 55-60%. The cavity size was normal. Left ventricular diastolic function could not be evaluated secondary to atrial fibrillation. 2. The right ventricle has normal systolic function. The cavity  was normal. There is no increase in right ventricular wall thickness. 3. No stenosis of the aortic valve. 4. The aortic root and ascending aorta are normal in size and structure.)      Anesthesia Quick Evaluation

## 2018-11-29 LAB — NOVEL CORONAVIRUS, NAA (HOSP ORDER, SEND-OUT TO REF LAB; TAT 18-24 HRS): SARS-CoV-2, NAA: NOT DETECTED

## 2018-11-30 NOTE — Telephone Encounter (Signed)
Called and spoke with [name] Frank - they were able to get their rx filled.   They plan for shunt on Tuesday.  They will check his glucose today and let me know how it looks

## 2018-12-01 ENCOUNTER — Encounter: Payer: Medicare HMO | Admitting: Physical Medicine & Rehabilitation

## 2018-12-02 ENCOUNTER — Other Ambulatory Visit: Payer: Self-pay

## 2018-12-02 ENCOUNTER — Encounter (HOSPITAL_COMMUNITY): Payer: Self-pay | Admitting: Surgery

## 2018-12-02 ENCOUNTER — Inpatient Hospital Stay (HOSPITAL_COMMUNITY)
Admission: RE | Admit: 2018-12-02 | Discharge: 2018-12-03 | DRG: 033 | Disposition: A | Discharge status: Home Health | Payer: Medicare HMO | Attending: Neurological Surgery | Admitting: Neurological Surgery

## 2018-12-02 ENCOUNTER — Inpatient Hospital Stay (HOSPITAL_COMMUNITY): Payer: Medicare HMO | Admitting: Anesthesiology

## 2018-12-02 ENCOUNTER — Encounter (HOSPITAL_COMMUNITY)
Admission: RE | Disposition: A | Discharge status: Home Health | Payer: Self-pay | Source: Home / Self Care | Attending: Neurological Surgery

## 2018-12-02 ENCOUNTER — Inpatient Hospital Stay (HOSPITAL_COMMUNITY): Payer: Medicare HMO | Admitting: Physician Assistant

## 2018-12-02 DIAGNOSIS — I2510 Atherosclerotic heart disease of native coronary artery without angina pectoris: Secondary | ICD-10-CM | POA: Diagnosis not present

## 2018-12-02 DIAGNOSIS — I480 Paroxysmal atrial fibrillation: Secondary | ICD-10-CM | POA: Diagnosis not present

## 2018-12-02 DIAGNOSIS — E119 Type 2 diabetes mellitus without complications: Secondary | ICD-10-CM | POA: Diagnosis present

## 2018-12-02 DIAGNOSIS — I252 Old myocardial infarction: Secondary | ICD-10-CM

## 2018-12-02 DIAGNOSIS — Z8249 Family history of ischemic heart disease and other diseases of the circulatory system: Secondary | ICD-10-CM

## 2018-12-02 DIAGNOSIS — R269 Unspecified abnormalities of gait and mobility: Secondary | ICD-10-CM | POA: Diagnosis not present

## 2018-12-02 DIAGNOSIS — I10 Essential (primary) hypertension: Secondary | ICD-10-CM | POA: Diagnosis present

## 2018-12-02 DIAGNOSIS — Z823 Family history of stroke: Secondary | ICD-10-CM | POA: Diagnosis not present

## 2018-12-02 DIAGNOSIS — Z955 Presence of coronary angioplasty implant and graft: Secondary | ICD-10-CM

## 2018-12-02 DIAGNOSIS — E785 Hyperlipidemia, unspecified: Secondary | ICD-10-CM | POA: Diagnosis present

## 2018-12-02 DIAGNOSIS — Z20828 Contact with and (suspected) exposure to other viral communicable diseases: Secondary | ICD-10-CM | POA: Diagnosis present

## 2018-12-02 DIAGNOSIS — Z8349 Family history of other endocrine, nutritional and metabolic diseases: Secondary | ICD-10-CM

## 2018-12-02 DIAGNOSIS — G40909 Epilepsy, unspecified, not intractable, without status epilepticus: Secondary | ICD-10-CM | POA: Diagnosis present

## 2018-12-02 DIAGNOSIS — R4189 Other symptoms and signs involving cognitive functions and awareness: Secondary | ICD-10-CM | POA: Diagnosis present

## 2018-12-02 DIAGNOSIS — F4323 Adjustment disorder with mixed anxiety and depressed mood: Secondary | ICD-10-CM | POA: Diagnosis not present

## 2018-12-02 DIAGNOSIS — Z87820 Personal history of traumatic brain injury: Secondary | ICD-10-CM

## 2018-12-02 DIAGNOSIS — G912 (Idiopathic) normal pressure hydrocephalus: Secondary | ICD-10-CM | POA: Diagnosis not present

## 2018-12-02 DIAGNOSIS — Z982 Presence of cerebrospinal fluid drainage device: Secondary | ICD-10-CM | POA: Diagnosis not present

## 2018-12-02 HISTORY — PX: VENTRICULOPERITONEAL SHUNT: SHX204

## 2018-12-02 LAB — CBC
HCT: 46.5 % (ref 39.0–52.0)
Hemoglobin: 15.1 g/dL (ref 13.0–17.0)
MCH: 28.5 pg (ref 26.0–34.0)
MCHC: 32.5 g/dL (ref 30.0–36.0)
MCV: 87.9 fL (ref 80.0–100.0)
Platelets: 151 10*3/uL (ref 150–400)
RBC: 5.29 MIL/uL (ref 4.22–5.81)
RDW: 13 % (ref 11.5–15.5)
WBC: 7 10*3/uL (ref 4.0–10.5)
nRBC: 0 % (ref 0.0–0.2)

## 2018-12-02 LAB — GLUCOSE, CAPILLARY
Glucose-Capillary: 184 mg/dL — ABNORMAL HIGH (ref 70–99)
Glucose-Capillary: 141 mg/dL — ABNORMAL HIGH (ref 70–99)
Glucose-Capillary: 125 mg/dL — ABNORMAL HIGH (ref 70–99)
Glucose-Capillary: 203 mg/dL — ABNORMAL HIGH (ref 70–99)

## 2018-12-02 LAB — CREATININE, SERUM
Creatinine, Ser: 0.84 mg/dL (ref 0.61–1.24)
GFR calc Af Amer: >60 mL/min (ref >60)
GFR calc non Af Amer: >60 mL/min (ref >60)

## 2018-12-02 LAB — MRSA PCR SCREENING: MRSA by PCR: NEGATIVE

## 2018-12-02 SURGERY — SHUNT INSERTION VENTRICULAR-PERITONEAL
Anesthesia: General | Site: Head | Laterality: Right

## 2018-12-02 MED ORDER — OXYCODONE HCL 5 MG PO TABS: 5.0000 mg | ORAL_TABLET | Freq: Once | ORAL | Status: DC | PRN

## 2018-12-02 MED ORDER — FENTANYL CITRATE (PF) 250 MCG/5ML IJ SOLN
INTRAMUSCULAR | Status: DC | PRN
  Administered 2018-12-02 (×4): 50 ug via INTRAVENOUS

## 2018-12-02 MED ORDER — TAMSULOSIN HCL 0.4 MG PO CAPS
0.4000 mg | ORAL_CAPSULE | Freq: Every day | ORAL | Status: DC
  Administered 2018-12-02: 21:00:00 0.4 mg via ORAL
  Filled 2018-12-02: qty 1

## 2018-12-02 MED ORDER — LIDOCAINE 2% (20 MG/ML) 5 ML SYRINGE
INTRAMUSCULAR | Status: AC
  Filled 2018-12-02: qty 5

## 2018-12-02 MED ORDER — ROCURONIUM BROMIDE 10 MG/ML (PF) SYRINGE
PREFILLED_SYRINGE | INTRAVENOUS | Status: DC | PRN
  Administered 2018-12-02: 50 mg via INTRAVENOUS

## 2018-12-02 MED ORDER — ONDANSETRON HCL 4 MG/2ML IJ SOLN: 4.0000 mg | INTRAMUSCULAR | Status: DC | PRN

## 2018-12-02 MED ORDER — POLYETHYLENE GLYCOL 3350 17 G PO PACK: 17.0000 g | PACK | Freq: Every day | ORAL | Status: DC | PRN

## 2018-12-02 MED ORDER — CHLORHEXIDINE GLUCONATE CLOTH 2 % EX PADS
6.0000 | MEDICATED_PAD | Freq: Every day | CUTANEOUS | Status: DC
  Administered 2018-12-02 – 2018-12-03 (×2): 6 via TOPICAL

## 2018-12-02 MED ORDER — CEFAZOLIN SODIUM-DEXTROSE 2-4 GM/100ML-% IV SOLN
2.0000 g | Freq: Three times a day (TID) | INTRAVENOUS | Status: AC
  Administered 2018-12-03 (×2): 2 g via INTRAVENOUS
  Filled 2018-12-02 (×2): qty 100

## 2018-12-02 MED ORDER — LACTATED RINGERS IV SOLN
INTRAVENOUS | Status: DC
  Administered 2018-12-02 (×3): via INTRAVENOUS

## 2018-12-02 MED ORDER — GLYCOPYRROLATE 0.2 MG/ML IJ SOLN
INTRAMUSCULAR | Status: DC | PRN
  Administered 2018-12-02: 0.2 mg via INTRAVENOUS

## 2018-12-02 MED ORDER — MIDAZOLAM HCL 2 MG/2ML IJ SOLN
INTRAMUSCULAR | Status: AC
  Filled 2018-12-02: qty 2

## 2018-12-02 MED ORDER — LABETALOL HCL 5 MG/ML IV SOLN: 10.0000 mg | INTRAVENOUS | Status: DC | PRN

## 2018-12-02 MED ORDER — DEXAMETHASONE SODIUM PHOSPHATE 10 MG/ML IJ SOLN
INTRAMUSCULAR | Status: AC
  Filled 2018-12-02: qty 1

## 2018-12-02 MED ORDER — FENTANYL CITRATE (PF) 250 MCG/5ML IJ SOLN
INTRAMUSCULAR | Status: AC
  Filled 2018-12-02: qty 5

## 2018-12-02 MED ORDER — ACETAMINOPHEN 10 MG/ML IV SOLN: 1000.0000 mg | Freq: Once | INTRAVENOUS | Status: DC | PRN

## 2018-12-02 MED ORDER — 0.9 % SODIUM CHLORIDE (POUR BTL) OPTIME
TOPICAL | Status: DC | PRN
  Administered 2018-12-02: 17:00:00 1000 mL

## 2018-12-02 MED ORDER — ACETAMINOPHEN 160 MG/5ML PO SOLN: 1000.0000 mg | Freq: Once | ORAL | Status: DC | PRN

## 2018-12-02 MED ORDER — ONDANSETRON HCL 4 MG PO TABS: 4.0000 mg | ORAL_TABLET | ORAL | Status: DC | PRN

## 2018-12-02 MED ORDER — THROMBIN 5000 UNITS EX SOLR
CUTANEOUS | Status: AC
  Filled 2018-12-02: qty 5000

## 2018-12-02 MED ORDER — MIDAZOLAM HCL 2 MG/2ML IJ SOLN
INTRAMUSCULAR | Status: DC | PRN
  Administered 2018-12-02: 1 mg via INTRAVENOUS

## 2018-12-02 MED ORDER — HYDROMORPHONE HCL 1 MG/ML IJ SOLN: 0.5000 mg | INTRAMUSCULAR | Status: DC | PRN

## 2018-12-02 MED ORDER — ROCURONIUM BROMIDE 10 MG/ML (PF) SYRINGE
PREFILLED_SYRINGE | INTRAVENOUS | Status: AC
  Filled 2018-12-02: qty 20

## 2018-12-02 MED ORDER — EPHEDRINE SULFATE 50 MG/ML IJ SOLN
INTRAMUSCULAR | Status: DC | PRN
  Administered 2018-12-02 (×3): 5 mg via INTRAVENOUS

## 2018-12-02 MED ORDER — LIDOCAINE-EPINEPHRINE 1 %-1:100000 IJ SOLN
INTRAMUSCULAR | Status: DC | PRN
  Administered 2018-12-02: 10 mL

## 2018-12-02 MED ORDER — PROPOFOL 10 MG/ML IV BOLUS
INTRAVENOUS | Status: AC
  Filled 2018-12-02: qty 20

## 2018-12-02 MED ORDER — HYDROCODONE-ACETAMINOPHEN 5-325 MG PO TABS
1.0000 | ORAL_TABLET | ORAL | Status: DC | PRN
  Administered 2018-12-02 – 2018-12-03 (×2): 1 via ORAL
  Filled 2018-12-02 (×2): qty 1

## 2018-12-02 MED ORDER — FAMOTIDINE IN NACL 20-0.9 MG/50ML-% IV SOLN
20.0000 mg | Freq: Two times a day (BID) | INTRAVENOUS | Status: DC
  Administered 2018-12-02 – 2018-12-03 (×2): 20 mg via INTRAVENOUS
  Filled 2018-12-02 (×2): qty 50

## 2018-12-02 MED ORDER — CHLORHEXIDINE GLUCONATE CLOTH 2 % EX PADS: 6.0000 | MEDICATED_PAD | Freq: Once | CUTANEOUS | Status: DC

## 2018-12-02 MED ORDER — ESMOLOL HCL 100 MG/10ML IV SOLN
INTRAVENOUS | Status: AC
  Filled 2018-12-02: qty 10

## 2018-12-02 MED ORDER — PHENYLEPHRINE 40 MCG/ML (10ML) SYRINGE FOR IV PUSH (FOR BLOOD PRESSURE SUPPORT)
PREFILLED_SYRINGE | INTRAVENOUS | Status: AC
  Filled 2018-12-02: qty 10

## 2018-12-02 MED ORDER — ROCURONIUM BROMIDE 10 MG/ML (PF) SYRINGE
PREFILLED_SYRINGE | INTRAVENOUS | Status: AC
  Filled 2018-12-02: qty 10

## 2018-12-02 MED ORDER — ONDANSETRON HCL 4 MG/2ML IJ SOLN
INTRAMUSCULAR | Status: AC
  Filled 2018-12-02: qty 2

## 2018-12-02 MED ORDER — HEPARIN SODIUM (PORCINE) 5000 UNIT/ML IJ SOLN: 5000.0000 [IU] | Freq: Three times a day (TID) | INTRAMUSCULAR | Status: DC

## 2018-12-02 MED ORDER — FENTANYL CITRATE (PF) 100 MCG/2ML IJ SOLN: 25.0000 ug | INTRAMUSCULAR | Status: DC | PRN

## 2018-12-02 MED ORDER — PROMETHAZINE HCL 25 MG PO TABS: 12.5000 mg | ORAL_TABLET | ORAL | Status: DC | PRN

## 2018-12-02 MED ORDER — DOCUSATE SODIUM 100 MG PO CAPS
100.0000 mg | ORAL_CAPSULE | Freq: Two times a day (BID) | ORAL | Status: DC
  Administered 2018-12-02 – 2018-12-03 (×2): 100 mg via ORAL
  Filled 2018-12-02 (×2): qty 1

## 2018-12-02 MED ORDER — LEVETIRACETAM 500 MG PO TABS
500.0000 mg | ORAL_TABLET | Freq: Two times a day (BID) | ORAL | Status: DC
  Administered 2018-12-02 – 2018-12-03 (×2): 500 mg via ORAL
  Filled 2018-12-02 (×2): qty 1

## 2018-12-02 MED ORDER — ACETAMINOPHEN 500 MG PO TABS: 1000.0000 mg | ORAL_TABLET | Freq: Once | ORAL | Status: DC | PRN

## 2018-12-02 MED ORDER — THROMBIN 5000 UNITS EX SOLR
OROMUCOSAL | Status: DC | PRN
  Administered 2018-12-02: 5 mL via TOPICAL

## 2018-12-02 MED ORDER — BACITRACIN ZINC 500 UNIT/GM EX OINT
TOPICAL_OINTMENT | CUTANEOUS | Status: AC
  Filled 2018-12-02: qty 28.35

## 2018-12-02 MED ORDER — OXYCODONE HCL 5 MG/5ML PO SOLN: 5.0000 mg | Freq: Once | ORAL | Status: DC | PRN

## 2018-12-02 MED ORDER — LIDOCAINE 2% (20 MG/ML) 5 ML SYRINGE
INTRAMUSCULAR | Status: DC | PRN
  Administered 2018-12-02: 100 mg via INTRAVENOUS

## 2018-12-02 MED ORDER — DEXAMETHASONE SODIUM PHOSPHATE 10 MG/ML IJ SOLN
INTRAMUSCULAR | Status: DC | PRN
  Administered 2018-12-02: 10 mg via INTRAVENOUS

## 2018-12-02 MED ORDER — CEFAZOLIN SODIUM-DEXTROSE 2-4 GM/100ML-% IV SOLN
2.0000 g | INTRAVENOUS | Status: AC
  Administered 2018-12-02: 2 g via INTRAVENOUS

## 2018-12-02 MED ORDER — PROPOFOL 10 MG/ML IV BOLUS
INTRAVENOUS | Status: DC | PRN
  Administered 2018-12-02: 20 mg via INTRAVENOUS
  Administered 2018-12-02: 80 mg via INTRAVENOUS

## 2018-12-02 MED ORDER — CEFAZOLIN SODIUM-DEXTROSE 2-4 GM/100ML-% IV SOLN
INTRAVENOUS | Status: AC
  Filled 2018-12-02: qty 100

## 2018-12-02 MED ORDER — ACETAMINOPHEN 325 MG PO TABS
650.0000 mg | ORAL_TABLET | ORAL | Status: DC | PRN
  Administered 2018-12-02: 21:00:00 650 mg via ORAL
  Filled 2018-12-02: qty 2

## 2018-12-02 MED ORDER — ATORVASTATIN CALCIUM 80 MG PO TABS
80.0000 mg | ORAL_TABLET | Freq: Every day | ORAL | Status: DC
  Administered 2018-12-03: 10:00:00 80 mg via ORAL
  Filled 2018-12-02: qty 1

## 2018-12-02 MED ORDER — DEXMEDETOMIDINE HCL IN NACL 200 MCG/50ML IV SOLN
INTRAVENOUS | Status: AC
  Filled 2018-12-02: qty 100

## 2018-12-02 MED ORDER — SUGAMMADEX SODIUM 200 MG/2ML IV SOLN
INTRAVENOUS | Status: DC | PRN
  Administered 2018-12-02: 200 mg via INTRAVENOUS

## 2018-12-02 MED ORDER — ONDANSETRON HCL 4 MG/2ML IJ SOLN
INTRAMUSCULAR | Status: DC | PRN
  Administered 2018-12-02: 4 mg via INTRAVENOUS

## 2018-12-02 MED ORDER — GLIPIZIDE ER 10 MG PO TB24
10.0000 mg | ORAL_TABLET | Freq: Two times a day (BID) | ORAL | Status: DC
  Administered 2018-12-03: 09:00:00 10 mg via ORAL
  Filled 2018-12-02 (×3): qty 1

## 2018-12-02 MED ORDER — ACETAMINOPHEN 650 MG RE SUPP: 650.0000 mg | RECTAL | Status: DC | PRN

## 2018-12-02 MED ORDER — CEFAZOLIN SODIUM 1 G IJ SOLR
INTRAMUSCULAR | Status: AC
  Filled 2018-12-02: qty 40

## 2018-12-02 MED ORDER — LIDOCAINE-EPINEPHRINE 1 %-1:100000 IJ SOLN
INTRAMUSCULAR | Status: AC
  Filled 2018-12-02: qty 1

## 2018-12-02 MED ORDER — METOPROLOL SUCCINATE ER 50 MG PO TB24
100.0000 mg | ORAL_TABLET | Freq: Every day | ORAL | Status: DC
  Administered 2018-12-03: 10:00:00 100 mg via ORAL
  Filled 2018-12-02: qty 2

## 2018-12-02 SURGICAL SUPPLY — 64 items
BLADE CLIPPER SURG (BLADE) ×3 IMPLANT
BLADE SURG 11 STRL SS (BLADE) ×3 IMPLANT
BOOT SUTURE AID YELLOW STND (SUTURE) IMPLANT
BUR ACORN 9.0 PRECISION (BURR) ×2 IMPLANT
BUR ACORN 9.0MM PRECISION (BURR) ×1
CANISTER SUCT 3000ML PPV (MISCELLANEOUS) ×3 IMPLANT
CLIP RANEY DISP (INSTRUMENTS) ×3 IMPLANT
CLOSURE WOUND 1/2 X4 (GAUZE/BANDAGES/DRESSINGS)
COVER WAND RF STERILE (DRAPES) IMPLANT
DECANTER SPIKE VIAL GLASS SM (MISCELLANEOUS) ×3 IMPLANT
DERMABOND ADVANCED (GAUZE/BANDAGES/DRESSINGS) ×2
DERMABOND ADVANCED .7 DNX12 (GAUZE/BANDAGES/DRESSINGS) ×1 IMPLANT
DRAPE HALF SHEET 40X57 (DRAPES) ×3 IMPLANT
DRAPE INCISE IOBAN 66X45 STRL (DRAPES) ×3 IMPLANT
DRAPE ORTHO SPLIT 77X108 STRL (DRAPES) ×4
DRAPE POUCH INSTRU U-SHP 10X18 (DRAPES) IMPLANT
DRAPE SURG ORHT 6 SPLT 77X108 (DRAPES) ×2 IMPLANT
DURAPREP 26ML APPLICATOR (WOUND CARE) ×6 IMPLANT
ELECT REM PT RETURN 9FT ADLT (ELECTROSURGICAL) ×3
ELECTRODE REM PT RTRN 9FT ADLT (ELECTROSURGICAL) ×1 IMPLANT
GAUZE 4X4 16PLY RFD (DISPOSABLE) IMPLANT
GLOVE BIO SURGEON STRL SZ 6.5 (GLOVE) ×6 IMPLANT
GLOVE BIO SURGEON STRL SZ7.5 (GLOVE) ×3 IMPLANT
GLOVE BIO SURGEONS STRL SZ 6.5 (GLOVE) ×3
GLOVE BIOGEL PI IND STRL 6.5 (GLOVE) ×1 IMPLANT
GLOVE BIOGEL PI INDICATOR 6.5 (GLOVE) ×2
GLOVE EXAM NITRILE XL STR (GLOVE) IMPLANT
GLOVE INDICATOR 7.5 STRL GRN (GLOVE) ×3 IMPLANT
GOWN STRL REUS W/ TWL LRG LVL3 (GOWN DISPOSABLE) ×2 IMPLANT
GOWN STRL REUS W/ TWL XL LVL3 (GOWN DISPOSABLE) IMPLANT
GOWN STRL REUS W/TWL 2XL LVL3 (GOWN DISPOSABLE) IMPLANT
GOWN STRL REUS W/TWL LRG LVL3 (GOWN DISPOSABLE) ×4
GOWN STRL REUS W/TWL XL LVL3 (GOWN DISPOSABLE)
HEMOSTAT POWDER KIT SURGIFOAM (HEMOSTASIS) ×3 IMPLANT
HEMOSTAT SURGICEL 2X14 (HEMOSTASIS) IMPLANT
KIT BASIN OR (CUSTOM PROCEDURE TRAY) ×3 IMPLANT
KIT TURNOVER KIT B (KITS) ×3 IMPLANT
MARKER SKIN DUAL TIP RULER LAB (MISCELLANEOUS) IMPLANT
NEEDLE HYPO 22GX1.5 SAFETY (NEEDLE) ×3 IMPLANT
NS IRRIG 1000ML POUR BTL (IV SOLUTION) ×3 IMPLANT
PACK LAMINECTOMY NEURO (CUSTOM PROCEDURE TRAY) ×3 IMPLANT
PAD ARMBOARD 7.5X6 YLW CONV (MISCELLANEOUS) ×9 IMPLANT
PASSER CATH 65CM DISP (NEUROSURGERY SUPPLIES) ×3 IMPLANT
SHEATH PERITONEAL INTRO 61 (MISCELLANEOUS) IMPLANT
SPONGE INTESTINAL PEANUT (DISPOSABLE) ×3 IMPLANT
SPONGE LAP 4X18 RFD (DISPOSABLE) IMPLANT
STAPLER SKIN PROX WIDE 3.9 (STAPLE) ×3 IMPLANT
STRIP CLOSURE SKIN 1/2X4 (GAUZE/BANDAGES/DRESSINGS) IMPLANT
SUT ETHILON 3 0 FSL (SUTURE) IMPLANT
SUT MON AB 3-0 SH 27 (SUTURE) ×4
SUT MON AB 3-0 SH27 (SUTURE) ×2 IMPLANT
SUT NURALON 4 0 TR CR/8 (SUTURE) IMPLANT
SUT SILK 0 TIES 10X30 (SUTURE) IMPLANT
SUT SILK 3 0 SH 30 (SUTURE) IMPLANT
SUT VIC AB 2-0 CP2 18 (SUTURE) ×6 IMPLANT
SUT VIC AB 3-0 SH 8-18 (SUTURE) ×6 IMPLANT
SYR CONTROL 10ML LL (SYRINGE) ×3 IMPLANT
TOWEL GREEN STERILE (TOWEL DISPOSABLE) ×3 IMPLANT
TOWEL GREEN STERILE FF (TOWEL DISPOSABLE) ×3 IMPLANT
TUBE CONNECTING 12'X1/4 (SUCTIONS)
TUBE CONNECTING 12X1/4 (SUCTIONS) IMPLANT
UNDERPAD 30X30 (UNDERPADS AND DIAPERS) IMPLANT
VALVE PROGRAM CERTAS SM ANTI (Valve) ×3 IMPLANT
WATER STERILE IRR 1000ML POUR (IV SOLUTION) ×3 IMPLANT

## 2018-12-02 NOTE — Transfer of Care (Signed)
Immediate Anesthesia Transfer of Care Note  Patient: [name] Frank.  Procedure(s) Performed: Right ventriculoperitoneal shunt placement (Right Head)  Patient Location: PACU  Anesthesia Type:General  Level of Consciousness: awake and alert   Airway & Oxygen Therapy: Patient Spontanous Breathing and Patient connected to face mask oxygen  Post-op Assessment: Report given to RN and Post -op Vital signs reviewed and stable  Post vital signs: Reviewed and stable  Last Vitals:  Vitals Value Taken Time  BP 122/67 12/02/18 1903  Temp 36.5 C 12/02/18 1850  Pulse 51 12/02/18 1904  Resp 13 12/02/18 1904  SpO2 98 % 12/02/18 1904  Vitals shown include unvalidated device data.  Last Pain:  Vitals:   12/02/18 1850  TempSrc:   PainSc: 0-No pain      Patients Stated Pain Goal: 2 (09/98/33 8250)  Complications: No apparent anesthesia complications

## 2018-12-02 NOTE — Anesthesia Postprocedure Evaluation (Signed)
Anesthesia Post Note  Patient: [name] Frank.  Procedure(s) Performed: Right ventriculoperitoneal shunt placement (Right Head)     Patient location during evaluation: PACU Anesthesia Type: General Level of consciousness: sedated and patient cooperative Pain management: pain level controlled Vital Signs Assessment: post-procedure vital signs reviewed and stable Respiratory status: spontaneous breathing Cardiovascular status: stable Anesthetic complications: no    Last Vitals:  Vitals:   12/02/18 2000 12/02/18 2019  BP: 133/60   Pulse: (!) 53 (!) 48  Resp: 19   Temp:    SpO2: 96%     Last Pain:  Vitals:   12/02/18 1920  TempSrc:   PainSc: 0-No pain                 Nolon Nations

## 2018-12-02 NOTE — Brief Op Note (Signed)
12/02/2018  6:39 PM  PATIENT:  [name] Frank.  68 y.o. male  PRE-OPERATIVE DIAGNOSIS:  Normal pressure hydrocephalus  POST-OPERATIVE DIAGNOSIS:  Normal pressure hydrocephalus  PROCEDURE:  Procedure(s): Right ventriculoperitoneal shunt placement (Right)  SURGEON:  Surgeon(s) and Role:    * Judith Part, MD - Primary  PHYSICIAN ASSISTANT:   ASSISTANTS: none   ANESTHESIA:   general  EBL:  25 mL   BLOOD ADMINISTERED:none  DRAINS: none   LOCAL MEDICATIONS USED:  LIDOCAINE   SPECIMEN:  No Specimen  DISPOSITION OF SPECIMEN:  N/A  COUNTS:  YES  TOURNIQUET:  * No tourniquets in log *  DICTATION: .Note written in EPIC  PLAN OF CARE: Admit to inpatient   PATIENT DISPOSITION:  PACU - hemodynamically stable.   Delay start of Pharmacological VTE agent (>24hrs) due to surgical blood loss or risk of bleeding: yes

## 2018-12-02 NOTE — Anesthesia Procedure Notes (Signed)
Procedure Name: Intubation Date/Time: 12/02/2018 4:49 PM Performed by: Clearnce Sorrel, CRNA Pre-anesthesia Checklist: Patient identified, Emergency Drugs available, Suction available, Patient being monitored and Timeout performed Patient Re-evaluated:Patient Re-evaluated prior to induction Oxygen Delivery Method: Circle system utilized Preoxygenation: Pre-oxygenation with 100% oxygen Induction Type: IV induction Ventilation: Mask ventilation without difficulty Laryngoscope Size: Mac and 4 Grade View: Grade I Tube type: Oral Tube size: 7.5 mm Number of attempts: 1 Airway Equipment and Method: Stylet Placement Confirmation: ETT inserted through vocal cords under direct vision,  positive ETCO2 and breath sounds checked- equal and bilateral Secured at: 23 cm Tube secured with: Tape Dental Injury: Teeth and Oropharynx as per pre-operative assessment

## 2018-12-02 NOTE — Progress Notes (Signed)
Pts belongings include a shirt, pants with a belt, shoes and his wallet. He asked that I leave everything in the belongings bag so his wife can take it home tomorrow.   [name], his wife, was updated about patient status.   Leticia Clas, RN BSN

## 2018-12-02 NOTE — H&P (Signed)
Surgical H&P Update  HPI: 68 y.o. man with prior trauma, then developed s/sx of NPH with matching progressive enlargement of the lateral ventricles. No changes in health since he was last seen. Still having symptoms and wishes to proceed with surgery.  PMHx:  Past Medical History:  Diagnosis Date  . Abnormality of gait    uses walker and wheelchair  . Cognitive deficit as late effect of traumatic brain injury ([place])   . Coronary artery disease    cardiologist-- dr s. Jake Bathe---  04-06-2014  NSTEMI  s/p  cardiac cath DES x1 to LAD and POBA to D1  . Dysrhythmia   . History of non-ST elevation myocardial infarction (NSTEMI)    04-06-2013  s/p  coronary stent and PCI  . Hydrocele, bilateral   . Hyperlipemia   . Hypertension   . Lower urinary tract symptoms (LUTS)   . Memory deficit   . PAF (paroxysmal atrial fibrillation) ([place]) cardilogist-- dr Jake Bathe   on-set 05/ 2020 during admission for [facility],  w/ RVR,  with cardizem/ toprol, back in NSR,  f/u by cardiologist , event monitor 09-16-2018 no results in epic by per pt wife was told result ok with no afib  . S/P drug eluting coronary stent placement    04-06-2014  @[facility]   DES x1 to LAD and POBA to D1  . SAH (subarachnoid hemorrhage) [facility]) neurologist-  dr Krista Blue--- last head CT 10-01-2018 resolving (10-23-2018  residual deficits memory issues, abnormal gait, congnitive   w/ SDH due to unwitnessed fall, traumatic head injury---- admission 07-14-2018 , d/c'd 08-01-2018,  readmitted 3 days later with late TBI effects,  d/c'd from rehab 08-22-2018  . Seizure disorder [facility])    followed by dr Krista Blue  . Subdural hematoma ([place])   . Type 2 diabetes mellitus ([place])    followed by pcp  . 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 Stroke Father   . Hypertension Father   . Hyperlipidemia Father   . Early death Mother   . Colitis Sister   . Appendicitis Brother    SocHx:  reports that he  has never smoked. He has never used smokeless tobacco. He reports previous alcohol use. He reports that he does not use drugs.  Physical Exam: AOx3, PERRL, FS, TM  Strength 5/5 x4, SILTx4, no drift  Assesment/Plan: 68 y.o. man with NPH, here for R VPS placement. Risks, benefits, and alternatives discussed and the patient would like to continue with surgery.  -OR today -4N post-op  Judith Part, MD 12/02/18 1:26 PM

## 2018-12-02 NOTE — Op Note (Signed)
PATIENT: [name] Frank.  DAY OF SURGERY: 12/02/18   PRE-OPERATIVE DIAGNOSIS:  Normal pressure hydrocephalus   POST-OPERATIVE DIAGNOSIS:  Normal pressure hydrocephalus   PROCEDURE:  Right ventriculoperitoneal shunt placement   SURGEON:  Surgeon(s) and Role:    Judith Part, MD - Primary   ANESTHESIA: ETGA   BRIEF HISTORY: This is a 68 year old man who presented with progressive cognitive decline, ataxia, and urinary incontinence following a TBI earlier this year. The patient was found to have progressively enlarging ventricles, concerning for normal pressure hydrocephalus. This was discussed with the patient and his wifeas well as risks, benefits, and alternatives and wished to proceed with shunt placement.   OPERATIVE DETAIL: The patient was taken to the operating room and placed on the OR table in the supine position. A formal time out was performed with two patient identifiers and confirmed the operative site. Anesthesia was induced by the anesthesia team. The head was turned contralaterally and a bump was placed under the ipsilateral shoulder to assist with tunneling. The operative sites were marked, hair was clipped with surgical clippers, and the area was then prepped and draped in a sterile fashion along the entire length of the shunt system. A curvilinear incision was placed on the right scalp over Kocher's point. A burr hole was placed, dura coagulated, and a ventricular catheter was inserted using standard landmarks to a depth of 7cm with good flow of CSF. Using a tunneler, the distal catheter was then passed subcutaneously from the scalp incision to the previously marked abdominal incision. The abdominal incision was opened and a small laparotomy was created. Intra-peritoneal location was confirmed by irrigation as well as passing a Penfield #4 across midline. The shunt system was flushed then connected to the proximal catheter. After spontaneous flow was seen through the distal  catheter, it was then placed intraperitoneal without resistance. With the shunt system in place and functioning, all incisions were copiously irrigated and then closed in layers. All instrument and sponge counts were correct. The patient was then returned to anesthesia for emergence. No apparent complications at the completion of the procedure.  IMPLANTS: Certas shunt valve, set to performance level 5   EBL:  70mL   DRAINS: none   SPECIMENS: none   Judith Part, MD 12/02/18 1:42 PM

## 2018-12-03 ENCOUNTER — Encounter (HOSPITAL_COMMUNITY): Payer: Self-pay | Admitting: Neurological Surgery

## 2018-12-03 ENCOUNTER — Inpatient Hospital Stay (HOSPITAL_COMMUNITY): Payer: Medicare HMO

## 2018-12-03 ENCOUNTER — Ambulatory Visit (INDEPENDENT_AMBULATORY_CARE_PROVIDER_SITE_OTHER): Payer: Medicare HMO | Admitting: Psychology

## 2018-12-03 DIAGNOSIS — F4323 Adjustment disorder with mixed anxiety and depressed mood: Secondary | ICD-10-CM

## 2018-12-03 LAB — GLUCOSE, CAPILLARY
Glucose-Capillary: 199 mg/dL — ABNORMAL HIGH (ref 70–99)
Glucose-Capillary: 254 mg/dL — ABNORMAL HIGH (ref 70–99)
Glucose-Capillary: 262 mg/dL — ABNORMAL HIGH (ref 70–99)
Glucose-Capillary: 262 mg/dL — ABNORMAL HIGH (ref 70–99)

## 2018-12-03 IMAGING — CT CT HEAD W/O CM
3 series · 15 of 47 positions shown, 18 images · non-contrast
Comparison: Prior MRI from [DATE].

CLINICAL DATA: Follow-up examination status post VP shunt
placement.

EXAM:
CT HEAD WITHOUT CONTRAST
TECHNIQUE: Contiguous axial images were obtained from the base of the skull
through the vertex without intravenous contrast.

[Series 3: head 5.0 h30s · axial · 0.47mm/px · z∈[-73,+82]mm · 9 of 37 slices shown, 12 images]
[im 3/37  brain]
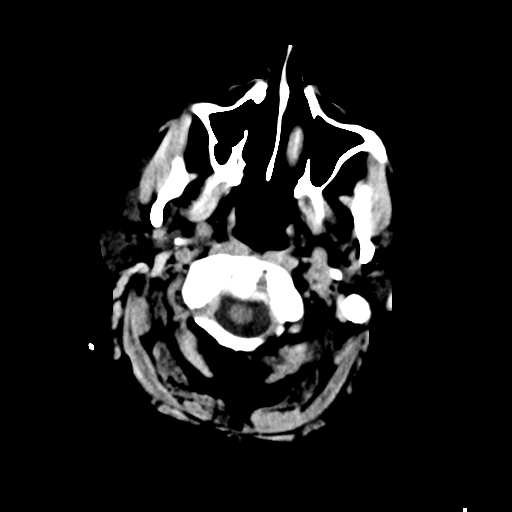
[im 3/37  bone]
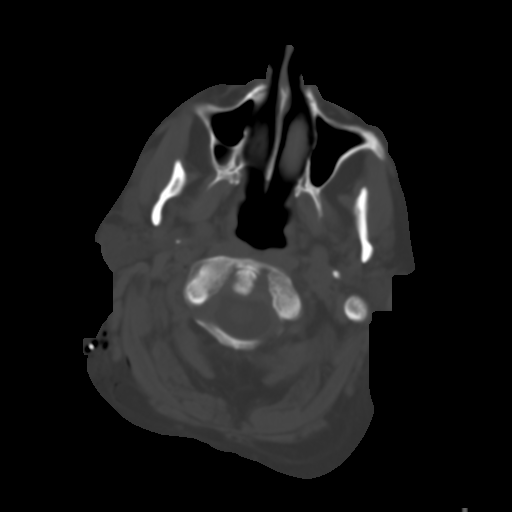
[im 7/37  brain]
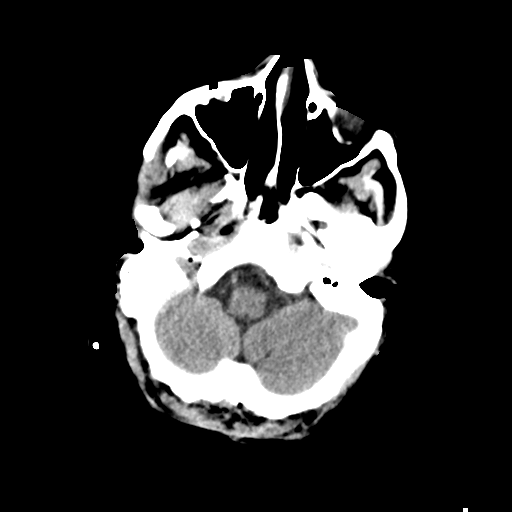
[im 10/37  brain]
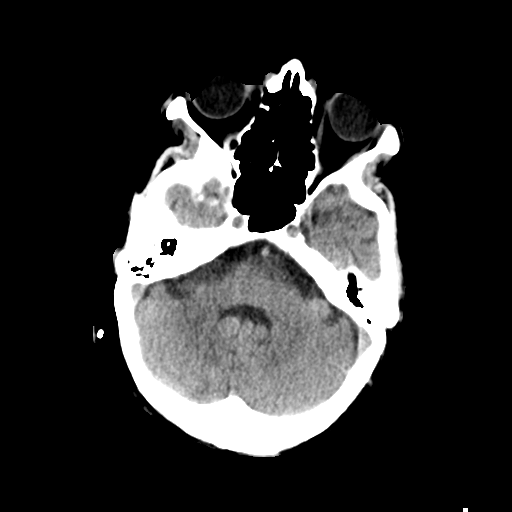
[im 14/37  brain]
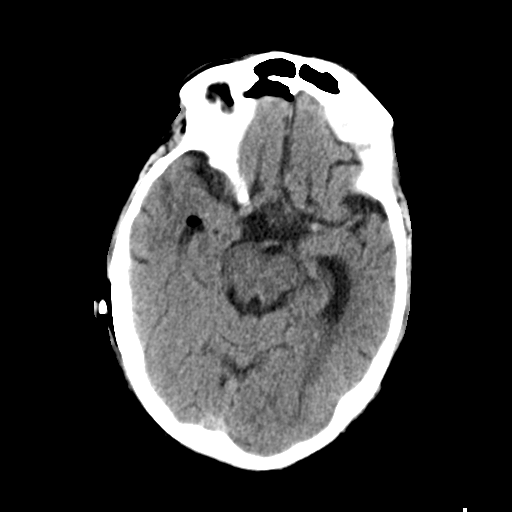
[im 19/37  brain]
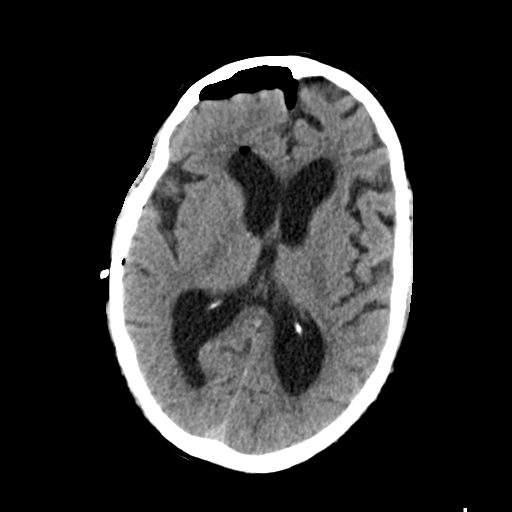
[im 19/37  bone]
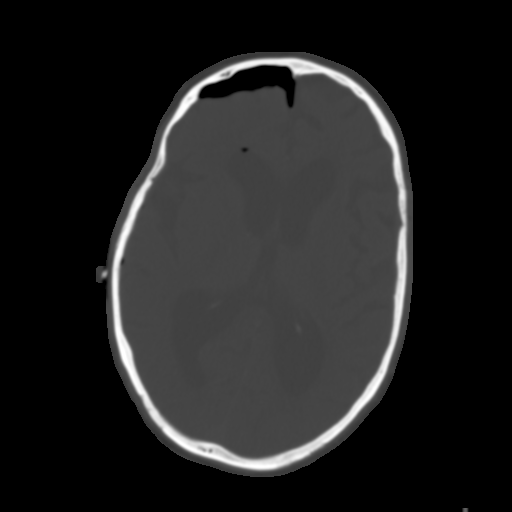
[im 23/37  brain]
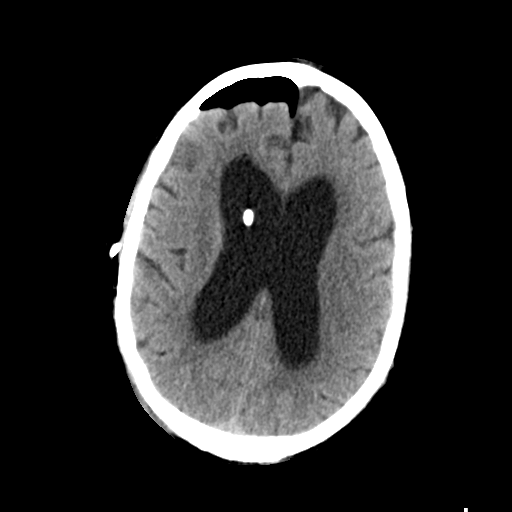
[im 27/37  brain]
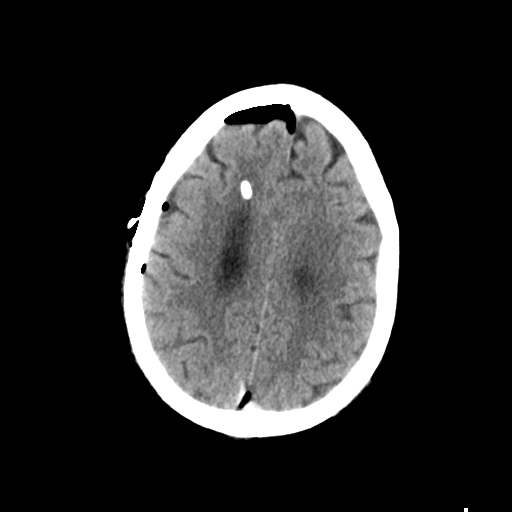
[im 30/37  brain]
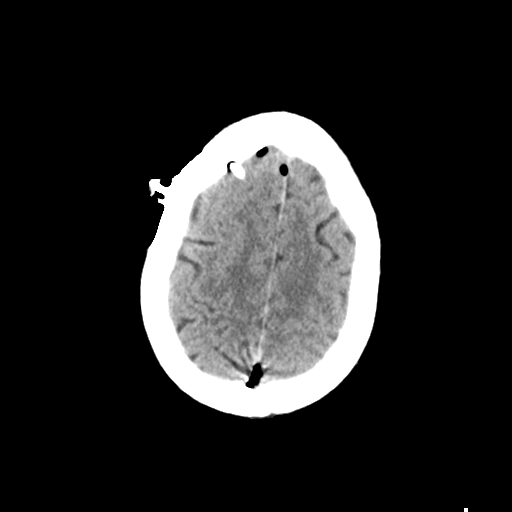
[im 34/37  brain]
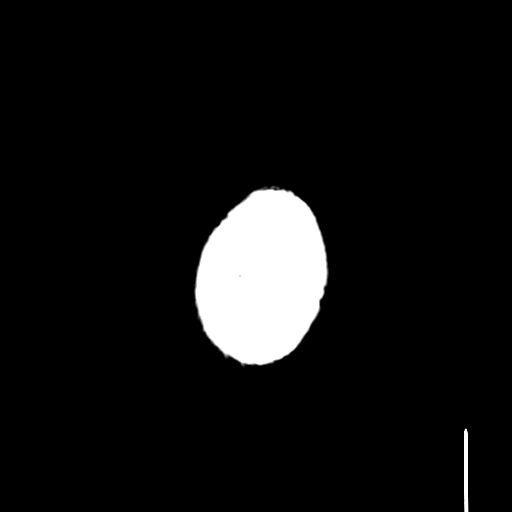
[im 34/37  bone]
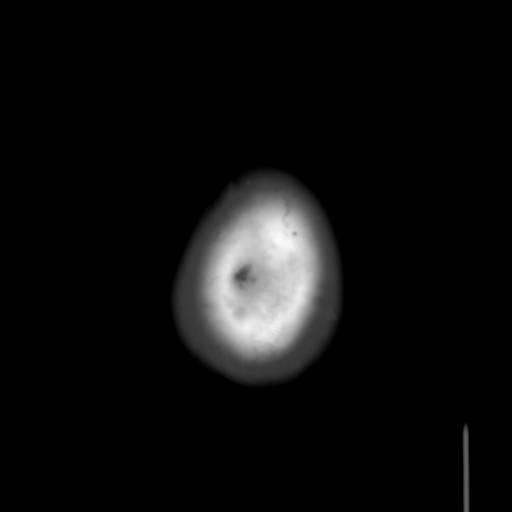

[Series 5: head 3.0 mpr cor · coronal · 0.36mm/px · 3 of 73 slices shown]
[im 25/73  brain]
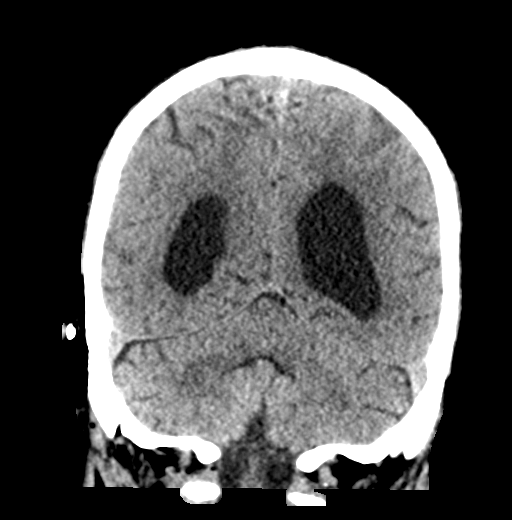
[im 33/73  brain]
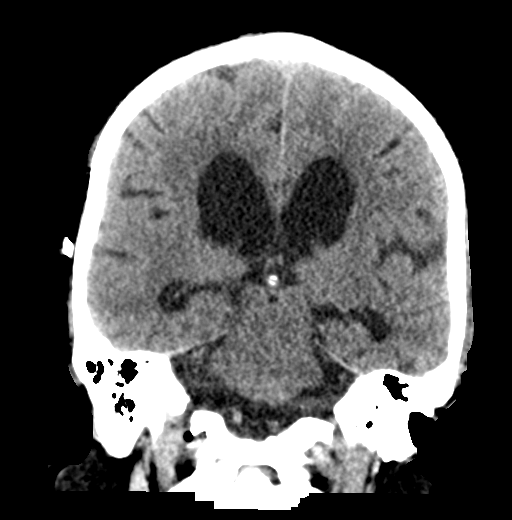
[im 41/73  brain]
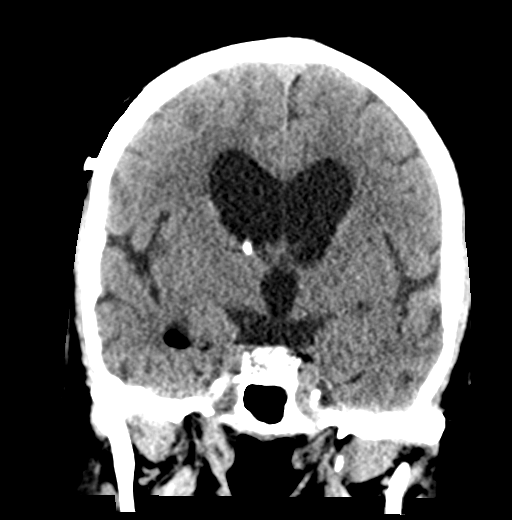

[Series 6: head 3.0 mpr sag · sagittal · 0.36mm/px · 3 of 58 slices shown]
[im 20/58  brain]
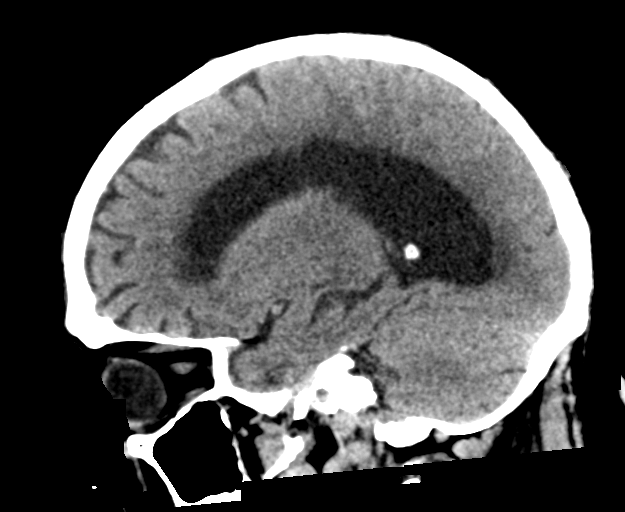
[im 29/58  brain]
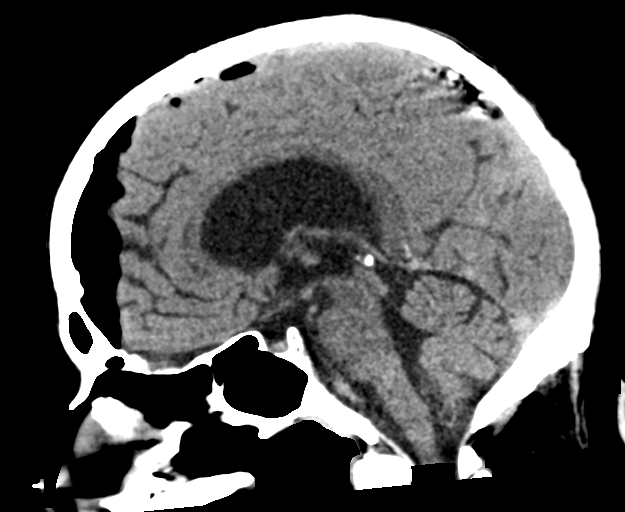
[im 39/58  brain]
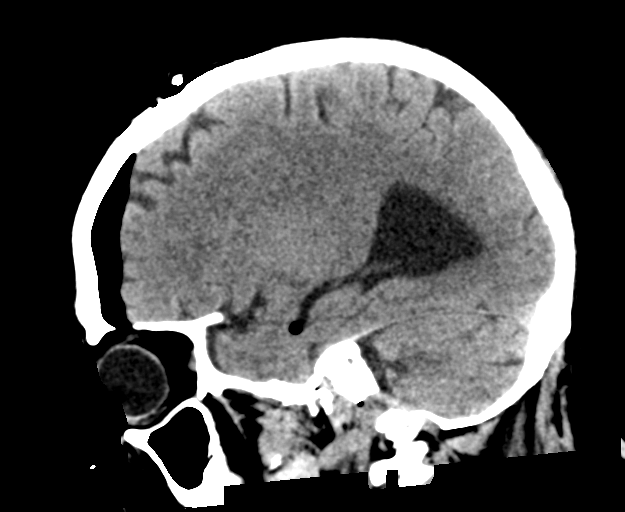

[15 of 47 positions shown; findings below may reference images not displayed]

FINDINGS: Brain: Postoperative changes from interval right frontal burr hole
craniotomy for VP shunt placement. Shunt catheter extends via the
right frontal approach with tip terminating in the body of the right
lateral ventricle. Overall, ventricular size and morphology is
relatively unchanged from previous. Postoperative pneumocephalus
seen within the ventricular system as well as overlying the right
cerebral convexity. No intracranial hemorrhage, extra-axial
collection, or other complication. No evidence for acute infarct. No
midline shift or mass effect.

Vascular: No hyperdense vessel.

Skull: Right frontal burr hole craniotomy. Mild swelling with soft
tissue emphysema seen along the shunt catheter tract within the
right scalp and visualized neck.

Sinuses/Orbits: Globes and orbital soft tissues within normal
limits.Visualized paranasal sinuses are clear. No mastoid effusion.

Other: None.
IMPRESSION: 1. Postoperative changes from interval right frontal burr hole
craniotomy for VP shunt placement. No complication. Stable
ventricular size and morphology.
2. No other acute intracranial abnormality.

## 2018-12-03 MED ORDER — HYDROCODONE-ACETAMINOPHEN 5-325 MG PO TABS: 1.0000 | ORAL_TABLET | ORAL | 0 refills | Status: DC | PRN

## 2018-12-03 NOTE — TOC Transition Note (Signed)
Transition of Care [facility]) - CM/SW Discharge Note   Patient Details  Name: Rollie Hynek. MRN: [id number] Date of Birth: Jan 20, 1951  Transition of Care [facility]) CM/SW Contact:  Ella Bodo, RN Phone Number: 12/03/2018, 2:06 PM   Clinical Narrative: 68 y.o. male presenting w/ NPH s/p VPS placement on 9/22.  PTA, pt requires assistance with ADLS; lives with spouse, who can provide 24h assistance.  He has RW, BSC and WC at home.  PT/OT recommending [place] follow up; wife prefers to use [facility], as pt has used in the past.  Referral to [facility] with [facility] for [facility] follow up.  No DME needs.       Final next level of care: [place] Barriers to Discharge: Barriers Resolved   Patient Goals and CMS Choice Patient states their goals for this hospitalization and ongoing recovery are:: to get home CMS Medicare.gov Compare Post Acute Care list provided to:: Patient Represenative (must comment)(wife) Choice offered to / list presented to : Spouse           Discharge Plan and Services   Discharge Planning Services: CM Consult Post Acute Care Choice: Home Health                    HH Arranged: PT, OT [facility] Agency: [place] Date [place]: 12/03/18 Time [place]: [place] Representative spoke with at [place]: [place] ([place]) Interventions     Readmission Risk Interventions Readmission Risk Prevention Plan 07/18/2018  Post Dischage Appt Not Complete  Appt Comments PCP refused to set up with RN Case manager; prefers to call pt  Medication Screening Complete  Transportation Screening Complete  Some recent data might be hidden   Reinaldo Raddle, RN, BSN  Trauma/Neuro ICU Case Manager [phone]

## 2018-12-03 NOTE — Evaluation (Signed)
Physical Therapy Evaluation Patient Details Name: [name] Frank. MRN: [id number] DOB: December 20, 1950 Today's Date: 12/03/2018   History of Present Illness  68 y.o. male presenting w/ NPH s/p VPS placement on 9/22. PMH including SDH due to unwitnessed fall with traumatic head injury (May 2020), CAD< HTN, PAF, Typoe 2 DM, and seizure disorder.  Clinical Impression  Patient presents with decreased mobility due to decreased balance, decreased strength, trunk rigidity and at risk for falls.  Wife reports via phone she has been helping him with his mobility since May.  Feels she can manage him at this level at home.  Recommend follow up HHPT prior to planned outpatient PT beginning in mid October.  PT to follow if not d/c.     Follow Up Recommendations Home health PT;Supervision/Assistance - 24 hour(wife reports they have used [facility] before and wish to set up with them again, set up for outpatient starting mid Oct.)    Equipment Recommendations  None recommended by PT    Recommendations for Other Services       Precautions / Restrictions Precautions Precautions: Fall      Mobility  Bed Mobility Overal bed mobility: Needs Assistance Bed Mobility: Rolling;Sidelying to Sit Rolling: Min guard Sidelying to sit: Mod assist;+2 for physical assistance       General bed mobility comments: Pt rolling onto his right side with Min Guard A for safety; requiring significant amount of time. Mod A for elevating trunk and then finding upright posture  Transfers Overall transfer level: Needs assistance Equipment used: Rolling walker (2 wheeled) Transfers: Sit to/from Stand Sit to Stand: Mod assist;+2 physical assistance         General transfer comment: Mod A +2 to power up into standing with right knee blocked  Ambulation/Gait Ambulation/Gait assistance: Min assist;+2 safety/equipment Gait Distance (Feet): 15 Feet Assistive device: Rolling walker (2 wheeled) Gait Pattern/deviations: Step-to  pattern;Shuffle;Decreased stride length;Festinating     General Gait Details: assist for lateral weight shift and cues for step length  Stairs            Wheelchair Mobility    Modified Rankin (Stroke Patients Only)       Balance Overall balance assessment: Needs assistance Sitting-balance support: No upper extremity supported;Feet supported Sitting balance-Leahy Scale: Poor Sitting balance - Comments: Pt with significant posterior lean and poor trunk control Postural control: Posterior lean Standing balance support: Bilateral upper extremity supported;During functional activity Standing balance-Leahy Scale: Poor Standing balance comment: Reliant on UE support; fearful of falling                             Pertinent Vitals/Pain Pain Assessment: Faces Faces Pain Scale: Hurts little more Pain Location: neck Pain Descriptors / Indicators: Constant;Discomfort;Grimacing;Headache Pain Intervention(s): Monitored during session;Repositioned    Home Living Family/patient expects to be discharged to:: Private residence Living Arrangements: Spouse/significant other Available Help at Discharge: Family;Available 24 hours/day Type of Home: House Home Access: Stairs to enter Entrance Stairs-Rails: Can reach both Entrance Stairs-Number of Steps: 5 Home Layout: Multi-level;Able to live on main level with bedroom/bathroom Home Equipment: Gilford Rile - 2 wheels;Walker - 4 wheels;Bedside commode;Shower seat - built in Additional Comments: Unsure of reliability due to decreased orientation and cognitive deficits    Prior Function Level of [place]: Needs assistance   Gait / Transfers Assistance Needed: Pt reports his wife assists and he uses RW and w/c  ADL's / Homemaking Assistance Needed: Pt reports his wife  assists with ADLs (more with LB ADLs).        Hand Dominance   Dominant Hand: Right    Extremity/Trunk Assessment   Upper Extremity Assessment Upper  Extremity Assessment: Defer to OT evaluation    Lower Extremity Assessment Lower Extremity Assessment: Generalized weakness    Cervical / Trunk Assessment Cervical / Trunk Assessment: Other exceptions Cervical / Trunk Exceptions: Poor trunk control in sitting; trunk rigidity  Communication   Communication: Expressive difficulties(soft spoken and uses hand gestures for yes/no questions)  Cognition Arousal/Alertness: Awake/alert Behavior During Therapy: Flat affect Overall Cognitive Status: History of cognitive impairments - at baseline Area of Impairment: Orientation;Attention;Memory;Following commands;Safety/judgement;Awareness;Problem solving                 Orientation Level: Disoriented to;Place Current Attention Level: Sustained Memory: Decreased short-term memory Following Commands: Follows one step commands inconsistently;Follows one step commands with increased time Safety/Judgement: Decreased awareness of deficits Awareness: Intellectual Problem Solving: Slow processing;Difficulty sequencing;Requires verbal cues;Requires tactile cues General Comments: Pt requiring signficant time throughout for follow cues and problem solve. Pt processing slowly and with poor awareness. Pt also distracted by environment and stops while performing tasks. Very motivated and agreeable to therapy      General Comments General comments (skin integrity, edema, etc.): BP supine 112/58, sitting at EOB 110/73, standing 107/75, sitting in recliener 102/59    Exercises     Assessment/Plan    PT Assessment Patient needs continued PT services  PT Problem List Decreased mobility;Decreased cognition;Decreased safety awareness;Decreased balance;Decreased coordination;Decreased knowledge of use of DME       PT Treatment Interventions DME instruction;Therapeutic activities;Balance training;Gait training;Functional mobility training;Therapeutic exercise;Patient/family education    PT Goals  (Current goals can be found in the Care Plan section)  Acute Rehab PT Goals Patient Stated Goal: "Go home today please" PT Goal Formulation: With patient/family Time For Goal Achievement: 12/10/18 Potential to Achieve Goals: Good    Frequency Min 3X/week   Barriers to discharge        Co-evaluation PT/OT/SLP Co-Evaluation/Treatment: Yes Reason for Co-Treatment: Necessary to address cognition/behavior during functional activity;To address functional/ADL transfers PT goals addressed during session: Mobility/safety with mobility;Balance;Proper use of DME OT goals addressed during session: ADL's and self-care       AM-PAC PT "6 Clicks" Mobility  Outcome Measure Help needed turning from your back to your side while in a flat bed without using bedrails?: A Little Help needed moving from lying on your back to sitting on the side of a flat bed without using bedrails?: A Lot Help needed moving to and from a bed to a chair (including a wheelchair)?: A Little Help needed standing up from a chair using your arms (e.g., wheelchair or bedside chair)?: A Little Help needed to walk in hospital room?: A Little Help needed climbing 3-5 steps with a railing? : A Little 6 Click Score: 17    End of Session Equipment Utilized During Treatment: Gait belt Activity Tolerance: Patient tolerated treatment well Patient left: in chair;with call bell/phone within reach;with chair alarm set Nurse Communication: Mobility status PT Visit Diagnosis: Other abnormalities of gait and mobility (R26.89);Other symptoms and signs involving the nervous system (R29.898)    Time: 6270-3500 PT Time Calculation (min) (ACUTE ONLY): 29 min   Charges:   PT Evaluation $PT Eval Moderate Complexity: 1 Mod          Sheran Lawless, [facility] [phone] 12/03/2018   Elray Mcgregor 12/03/2018, 11:17 AM

## 2018-12-03 NOTE — Discharge Summary (Signed)
Discharge Summary  Date of Admission: 12/02/2018  Date of Discharge: 12/03/18  Attending Physician: Emelda Brothers, MD  Hospital Course: Patient was admitted following an uncomplicated right VP shunt placement. He was recovered in PACU and transferred to 4N. His post-op CT head showed the catheter in good position. hospital course was uncomplicated and the patient was discharged home on 12/03/2018. He will follow up in clinic with me in 2 weeks.  Neurologic exam at discharge:  AOx3, PERRL, EOMI, FS, TM Strength 5/5 x4, SILTx4  Discharge diagnosis: Normal pressure hydrocephalus  Judith Part, MD 12/03/18 8:45 AM

## 2018-12-03 NOTE — Progress Notes (Deleted)
[facility] [address], [address] [place], [place] [address] [phone]  Date:  12/04/2018   Name:  Antonnio Mcdavid.   DOB:  21-Oct-1950   MRN:  [id number]  PCP:  Pearline Cables, MD    Chief Complaint: No chief complaint on file.   History of Present Illness:  Trelon Blish. is a 68 y.o. very pleasant male patient who presents with the following:  Here today for follow-up visit History of end STEMI in 2016 status post PCI, hyperlipidemia, hypertension, diabetes.  He was in generally good health until May of this year when he fell, and suffered a traumatic brain injury and subarachnoid hemorrhage/subdural hematoma He developed signs and symptoms of normal pressure hydrocephalus with enlargement of the ventricles, he was admitted and had a shunt placed earlier this week per Dr. Johnsie Cancel   Neurosurgeon Dr. Maurice Small Cardiologist Dr. Dot Been with [facility] in [facility] Physical medicine rehab Dr. Wynn Banker Urologist Dr. Loleta Rose was also diagnosed with large bilateral hydroceles.  Per most recent urology note, at this point they plan to operate next month Patient Active Problem List   Diagnosis Date Noted  . 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 Normal pressure hydrocephalus (HCC) 12/02/2018  . History of subdural hematoma 11/18/2018  . Urinary incontinence 11/18/2018  . Memory loss 10/09/2018  . Right hemiparesis (HCC) 09/04/2018  . Cognitive deficit as late effect of traumatic brain injury (HCC) 09/04/2018  . Bradycardia   . Leukopenia   . Hypoalbuminemia due to protein-calorie malnutrition (HCC)   . Hydrocele   . Hydrocephalus (HCC) 07/28/2018  . Mild renal insufficiency 07/28/2018  . Paroxysmal atrial fibrillation (HCC) 07/28/2018  . Subdural hematoma (HCC) 07/14/2018  . Hypertension, essential 01/16/2018  . Controlled type 2 diabetes mellitus without complication, without long-term  current use of insulin (HCC) 01/16/2018  . Coronary artery disease involving native heart without angina pectoris 01/16/2018    Past Medical History:  Diagnosis Date  . Abnormality of gait    uses walker and wheelchair  . Cognitive deficit as late effect of traumatic brain injury (HCC)   . Coronary artery disease    cardiologist-- dr s. Dickie La---  04-06-2014  NSTEMI  s/p  cardiac cath DES x1 to LAD and POBA to D1  . Dysrhythmia   . History of non-ST elevation myocardial infarction (NSTEMI)    04-06-2013  s/p  coronary stent and PCI  . Hydrocele, bilateral   . Hyperlipemia   . Hypertension   . Lower urinary tract symptoms (LUTS)   . Memory deficit   . PAF (paroxysmal atrial fibrillation) (HCC) cardilogist-- dr Dickie La   on-set 05/ 2020 during admission for [facility],  w/ RVR,  with cardizem/ toprol, back in [facility],  f/u by cardiologist , event monitor 09-16-2018 no results in epic by per pt wife was told result ok with no afib  . S/P drug eluting coronary stent placement    04-06-2014  @[facility]   DES x1 to LAD and POBA to D1  . SAH (subarachnoid hemorrhage) [facility]) neurologist-  dr Terrace Arabia--- last head CT 10-01-2018 resolving (10-23-2018  residual deficits memory issues, abnormal gait, congnitive   w/ SDH due to unwitnessed fall, traumatic head injury---- admission 07-14-2018 , d/c'd 08-01-2018,  readmitted 3 days later with late TBI effects,  d/c'd from rehab 08-22-2018  . Seizure disorder [facility])    followed by dr Terrace Arabia  . Subdural hematoma (HCC)   .  Type 2 diabetes mellitus ([facility])    followed by pcp  . 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 CATARACT EXTRACTION W/ INTRAOCULAR LENS  IMPLANT, BILATERAL  2008; 2009  . CORONARY ANGIOPLASTY WITH STENT PLACEMENT  04-06-2014  @[facility]    DES x1 to LAD and POBA to D1  . HYDROCELE EXCISION Bilateral 10/27/2018   Procedure: HYDROCELECTOMY ADULT;  Surgeon: [name], MD;  Location: [facility];  Service: Urology;  Laterality: Bilateral;  . TONSILLECTOMY  child    Social History   Tobacco Use  . Smoking status: Never Smoker  . Smokeless tobacco: Never Used  Substance Use Topics  . Alcohol use: Not Currently    Comment: VERY RARELY  . Drug use: Never    Family History  Problem Relation Age of Onset  . Stroke Father   . Hypertension Father   . Hyperlipidemia Father   . Early death Mother   . Colitis Sister   . Appendicitis Brother     No Known Allergies  Medication list has been reviewed and updated.  Current Facility-Administered Medications on File Prior to Visit  Medication Dose Route Frequency Provider Last Rate Last Dose  . acetaminophen (TYLENOL) tablet 650 mg  650 mg Oral Q4H PRN Judith Part, MD   650 mg at 12/02/18 2059   Or  . acetaminophen (TYLENOL) suppository 650 mg  650 mg Rectal Q4H PRN Judith Part, MD      . atorvastatin (LIPITOR) tablet 80 mg  80 mg Oral Daily Ostergard, Thomas A, MD      . ceFAZolin (ANCEF) IVPB 2g/100 mL premix  2 g Intravenous Q8H Judith Part, MD   Stopped at 12/03/18 [id number]  . Chlorhexidine Gluconate Cloth 2 % PADS 6 each  6 each Topical Daily Judith Part, MD   6 each at 12/02/18 2019  . docusate sodium (COLACE) capsule 100 mg  100 mg Oral BID Judith Part, MD   100 mg at 12/02/18 2130  . famotidine (PEPCID) IVPB 20 mg premix  20 mg Intravenous Q12H Judith Part, MD   Stopped at 12/02/18 2206  . glipiZIDE (GLUCOTROL XL) 24 hr tablet 10 mg  10 mg Oral BID WC Judith Part, MD      . Derrill Memo ON 12/04/2018] heparin injection 5,000 Units  5,000 Units Subcutaneous Q8H Ostergard, Joyice Faster, MD      . HYDROcodone-acetaminophen (NORCO/VICODIN) 5-325 MG per tablet 1 tablet  1 tablet Oral Q4H PRN Judith Part, MD   1 tablet at 12/02/18 2229  . HYDROmorphone (DILAUDID) injection 0.5 mg  0.5 mg Intravenous Q3H PRN Judith Part, MD      . labetalol (NORMODYNE)  injection 10-40 mg  10-40 mg Intravenous Q10 min PRN Judith Part, MD      . lactated ringers infusion   Intravenous Continuous Oleta Mouse, MD 10 mL/hr at 12/03/18 0300    . levETIRAcetam (KEPPRA) tablet 500 mg  500 mg Oral BID Judith Part, MD   500 mg at 12/02/18 2130  . metoprolol succinate (TOPROL-XL) 24 hr tablet 100 mg  100 mg Oral Daily Judith Part, MD      . ondansetron (ZOFRAN) tablet 4 mg  4 mg Oral Q4H PRN Judith Part, MD       Or  . ondansetron (ZOFRAN) injection 4 mg  4 mg Intravenous Q4H PRN  Jadene Pierini, MD      . polyethylene glycol (MIRALAX / GLYCOLAX) packet 17 g  17 g Oral Daily PRN Jadene Pierini, MD      . promethazine (PHENERGAN) tablet 12.5-25 mg  12.5-25 mg Oral Q4H PRN Jadene Pierini, MD      . tamsulosin (FLOMAX) capsule 0.4 mg  0.4 mg Oral QPC supper Jadene Pierini, MD   0.4 mg at 12/02/18 2059   Current Outpatient Medications on File Prior to Visit  Medication Sig Dispense Refill  . acetaminophen (TYLENOL) 325 MG tablet Take 2 tablets (650 mg total) by mouth every 6 (six) hours as needed for mild pain.    Marland Kitchen atorvastatin (LIPITOR) 80 MG tablet Take 1 tablet (80 mg total) by mouth daily. 30 tablet 1  . carbidopa-levodopa (SINEMET IR) 25-100 MG tablet Take 1 tablet by mouth 3 (three) times daily. 90 tablet 6  . docusate sodium (COLACE) 100 MG capsule Take 1 capsule (100 mg total) by mouth 2 (two) times daily. For constipation (Patient taking differently: Take 100 mg by mouth daily as needed for mild constipation. ) 60 capsule 0  . doxycycline (VIBRAMYCIN) 100 MG capsule Take 100 mg by mouth 2 (two) times daily.    Marland Kitchen glipiZIDE (GLUCOTROL XL) 10 MG 24 hr tablet Take 1 tablet (10 mg total) by mouth 2 (two) times daily. 60 tablet 1  . HYDROcodone-acetaminophen (NORCO) 10-325 MG tablet Take 1-2 tablets by mouth every 4 (four) hours as needed for moderate pain. Maximum dose per 24 hours - 8 pills (Patient not taking:  Reported on 11/25/2018) 20 tablet 0  . levETIRAcetam (KEPPRA) 500 MG tablet Take 1 tablet (500 mg total) by mouth 2 (two) times daily. 60 tablet 11  . metoprolol succinate (TOPROL-XL) 100 MG 24 hr tablet Take 1 tablet (100 mg total) by mouth daily. Take with or immediately following a meal. 30 tablet 5  . tamsulosin (FLOMAX) 0.4 MG CAPS capsule Take 1 capsule (0.4 mg total) by mouth daily after supper. 30 capsule 1    Review of Systems:  As per HPI- otherwise negative.   Physical Examination: There were no vitals filed for this visit. There were no vitals filed for this visit. There is no height or weight on file to calculate BMI. Ideal Body Weight:    GEN: WDWN, NAD, Non-toxic, A & O x 3 HEENT: Atraumatic, Normocephalic. Neck supple. No masses, No LAD. Ears and Nose: No external deformity. CV: RRR, No M/G/R. No JVD. No thrill. No extra heart sounds. PULM: CTA B, no wheezes, crackles, rhonchi. No retractions. No resp. distress. No accessory muscle use. ABD: S, NT, ND, +BS. No rebound. No HSM. EXTR: No c/c/e NEURO Normal gait.  PSYCH: Normally interactive. Conversant. Not depressed or anxious appearing.  Calm demeanor.    Assessment and Plan: ***  Signed Abbe Amsterdam, MD

## 2018-12-03 NOTE — Evaluation (Signed)
Occupational Therapy Evaluation Patient Details Name: [name] Frank. MRN: [id number] DOB: February 08, 1951 Today's Date: 12/03/2018    History of Present Illness 68 y.o. male presenting w/ NPH s/p VPS placement on 9/22. PMH including SDH due to unwitnessed fall with traumatic head injury (May 2020), CAD< HTN, PAF, Typoe 2 DM, and seizure disorder.    Clinical Impression   PTA, pt was living his wife and required assistance for ADLs and safety with functional mobility. Pt currently requiring Min A for UB ADLs and Mod A for LB ADLs. Pt presenting with poor balance, strength, cognition, and activity tolerance. Pt requiring significant amount of time throughout session. Pt requiring Min A and RW for functional mobility. Pt would benefit from further acute OT to facilitate safe dc. Recommend dc to home with HHOT for further OT to optimize safety, independence with ADLs, and return to PLOF.      Follow Up Recommendations  Home health OT;Supervision/Assistance - 24 hour    Equipment Recommendations  None recommended by OT    Recommendations for Other Services PT consult     Precautions / Restrictions Precautions Precautions: Fall      Mobility Bed Mobility Overal bed mobility: Needs Assistance Bed Mobility: Rolling;Sidelying to Sit Rolling: Min guard Sidelying to sit: Mod assist;+2 for physical assistance       General bed mobility comments: Pt rolling onto his right side with Min Guard A for safety; requiring significant amount of time. Mod A for elevating trunk and then finding upright posture  Transfers Overall transfer level: Needs assistance Equipment used: Rolling walker (2 wheeled) Transfers: Sit to/from Stand Sit to Stand: Mod assist;+2 physical assistance         General transfer comment: Mod A +2 to power up into standing with right knee blocked    Balance Overall balance assessment: Needs assistance Sitting-balance support: No upper extremity supported;Feet  supported Sitting balance-Leahy Scale: Poor Sitting balance - Comments: Pt with significant posterior lean and poor trunk control Postural control: Posterior lean Standing balance support: Bilateral upper extremity supported;During functional activity Standing balance-Leahy Scale: Poor Standing balance comment: Reliant on UE support                           ADL either performed or assessed with clinical judgement   ADL Overall ADL's : Needs assistance/impaired Eating/Feeding: Set up;Sitting   Grooming: Wash/dry face;Set up;Supervision/safety;Sitting   Upper Body Bathing: Minimal assistance;Sitting   Lower Body Bathing: Moderate assistance;Sit to/from stand   Upper Body Dressing : Minimal assistance;Sitting   Lower Body Dressing: Moderate assistance;Sit to/from stand;Maximal assistance Lower Body Dressing Details (indicate cue type and reason): Pt attempting to don shoes while sitting at EOB. Presenting with poor sitting balance and required Mod-Max A for trunk support while he slide shoes onto feet. Pt also requiring assistance for tying shoes.  Toilet Transfer: Moderate assistance;+2 for physical assistance;Ambulation;RW(Simulated to recliner) Toilet Transfer Details (indicate cue type and reason): Mod A +2 to power up into standing with right knee blocked         Functional mobility during ADLs: Minimal assistance;Rolling walker;+2 for safety/equipment General ADL Comments: Pt presenting with poor balance, strength, cognition, and activity tolerance. Very motivated     Vision Baseline Vision/History: Wears glasses Wears Glasses: Reading only       Perception     Praxis      Pertinent Vitals/Pain Pain Assessment: Faces Faces Pain Scale: Hurts little more Pain Descriptors /  Indicators: Constant;Discomfort;Grimacing;Headache Pain Intervention(s): Monitored during session;Limited activity within patient's tolerance;Repositioned     Hand Dominance Right    Extremity/Trunk Assessment Upper Extremity Assessment Upper Extremity Assessment: Overall WFL for tasks assessed(Poor FM coorindation)   Lower Extremity Assessment Lower Extremity Assessment: Defer to PT evaluation   Cervical / Trunk Assessment Cervical / Trunk Assessment: Other exceptions Cervical / Trunk Exceptions: Poor trunk control in sitting   Communication Communication Communication: Expressive difficulties   Cognition Arousal/Alertness: Awake/alert Behavior During Therapy: WFL for tasks assessed/performed Overall Cognitive Status: History of cognitive impairments - at baseline Area of Impairment: Orientation;Attention;Memory;Following commands;Safety/judgement;Awareness;Problem solving                 Orientation Level: Disoriented to;Place(Stating it is "2019". knowing he was at [facility] and why) Current Attention Level: Sustained Memory: Decreased short-term memory Following Commands: Follows one step commands inconsistently;Follows one step commands with increased time Safety/Judgement: Decreased awareness of deficits Awareness: Intellectual Problem Solving: Slow processing;Difficulty sequencing;Requires verbal cues;Requires tactile cues General Comments: Pt requiring signficant time throughout for follow cues and problem solve. Pt processing slowly and with poor awareness. Pt also distracted by environment and stops while performing tasks. Very motivated and agreeable to therapy   General Comments  BP supine 112/58, sitting at EOB 110/73, standing 107/75, sitting in recliener 102/59    Exercises     Shoulder Instructions      Home Living Family/patient expects to be discharged to:: Private residence Living Arrangements: Spouse/significant other Available Help at Discharge: Family;Available 24 hours/day Type of Home: House Home Access: Stairs to enter [employer] of Steps: 5 Entrance Stairs-Rails: Can reach both Home Layout: Multi-level;Able to  live on main level with bedroom/bathroom     Bathroom Shower/Tub: Walk-in shower   Bathroom Toilet: Handicapped height     Home Equipment: Environmental consultant - 2 wheels;Walker - 4 wheels;Bedside commode;Shower seat - built in   Additional Comments: Unsure of reliability due to decreased orientation and cognitive deficits      Prior Functioning/Environment Level of Independence: Needs assistance  Gait / Transfers Assistance Needed: Pt reports his wife assists and he uses RW and w/c ADL's / Homemaking Assistance Needed: Pt reports his wife assists with ADLs (more with LB ADLs).            OT Problem List: Decreased strength;Decreased range of motion;Decreased activity tolerance;Impaired balance (sitting and/or standing);Decreased cognition;Decreased safety awareness;Decreased knowledge of use of DME or AE;Decreased knowledge of precautions;Pain      OT Treatment/Interventions: Self-care/ADL training;Therapeutic exercise;Energy conservation;DME and/or AE instruction;Therapeutic activities;Patient/family education    OT Goals(Current goals can be found in the care plan section) Acute Rehab OT Goals Patient Stated Goal: "Go home today please" OT Goal Formulation: With patient Time For Goal Achievement: 12/17/18 Potential to Achieve Goals: Good  OT Frequency: Min 2X/week   Barriers to D/C:            Co-evaluation PT/OT/SLP Co-Evaluation/Treatment: Yes Reason for Co-Treatment: To address functional/ADL transfers;Necessary to address cognition/behavior during functional activity   OT goals addressed during session: ADL's and self-care      AM-PAC OT "6 Clicks" Daily Activity     Outcome Measure Help from another person eating meals?: None Help from another person taking care of personal grooming?: A Little Help from another person toileting, which includes using toliet, bedpan, or urinal?: A Little Help from another person bathing (including washing, rinsing, drying)?: A Lot Help  from another person to put on and taking off regular upper body clothing?: A  Little Help from another person to put on and taking off regular lower body clothing?: A Lot 6 Click Score: 17   End of Session Equipment Utilized During Treatment: Gait belt;Rolling walker Nurse Communication: Mobility status  Activity Tolerance: Patient tolerated treatment well Patient left: in chair;with call bell/phone within reach;with chair alarm set  OT Visit Diagnosis: Unsteadiness on feet (R26.81);Other abnormalities of gait and mobility (R26.89);Muscle weakness (generalized) (M62.81);Other symptoms and signs involving cognitive function;Pain Pain - Right/Left: Right Pain - part of body: (Head and neck)                Time: 8675-4492 OT Time Calculation (min): 32 min Charges:  OT General Charges $OT Visit: 1 Visit OT Evaluation $OT Eval Moderate Complexity: 1 Mod  Jaxx Huish MSOT, OTR/L Acute Rehab Pager: [phone] Office: [phone]  Theodoro Grist Deshaun Schou 12/03/2018, 11:03 AM

## 2018-12-03 NOTE — Discharge Instructions (Signed)
Discharge Instructions  No restriction in activities, slowly increase your activity back to normal.   Your incisions are closed with dermabond (purple glue) and absorbable sutures. These will naturally fall off over the next few weeks.   Okay to shower on the day of discharge. Use regular soap and water and try to be gentle when cleaning your incision.   Follow up with Dr. Zada Finders in 2 weeks after discharge. If you do not already have a discharge appointment, please call his office at [phone] to schedule a follow up appointment. If you have any concerns or questions, please call the office and let us know.

## 2018-12-03 NOTE — Progress Notes (Signed)
Neurosurgery Service Progress Note  Subjective: No acute events overnight, no new complaints this morning   Objective: Vitals:   12/03/18 0400 12/03/18 0500 12/03/18 0600 12/03/18 0700  BP: (!) 109/56 (!) 93/56 133/61 124/62  Pulse: (!) 52 (!) 53 (!) 56 61  Resp: 17 (!) 21 16 13   Temp: 97.8 F (36.6 C)     TempSrc: Axillary     SpO2: 94% 93% 95% 95%  Weight:      Height:       Temp (24hrs), Avg:97.8 F (36.6 C), Min:97.5 F (36.4 C), Max:98.4 F (36.9 C)  CBC Latest Ref Rng & Units 12/02/2018 11/28/2018 10/27/2018  WBC 4.0 - 10.5 K/uL 7.0 4.7 -  Hemoglobin 13.0 - 17.0 g/dL 10/29/2018 14.4 31.5  Hematocrit 39.0 - 52.0 % 46.5 45.3 43.0  Platelets 150 - 400 K/uL 151 176 -   BMP Latest Ref Rng & Units 12/02/2018 11/28/2018 10/27/2018  Glucose 70 - 99 mg/dL - 10/29/2018) 867(Y)  BUN 8 - 23 mg/dL - 20 -  Creatinine 195(K - 1.24 mg/dL 9.32 6.71 -  Sodium 2.45 - 145 mmol/L - 135 138  Potassium 3.5 - 5.1 mmol/L - 4.2 4.2  Chloride 98 - 111 mmol/L - 102 -  CO2 22 - 32 mmol/L - 25 -  Calcium 8.9 - 10.3 mg/dL - 9.2 -    Intake/Output Summary (Last 24 hours) at 12/03/2018 12/05/2018 Last data filed at 12/03/2018 0700 Gross per 24 hour  Intake 1263.75 ml  Output 25 ml  Net 1238.75 ml    Current Facility-Administered Medications:  .  acetaminophen (TYLENOL) tablet 650 mg, 650 mg, Oral, Q4H PRN, 650 mg at 12/02/18 2059 **OR** acetaminophen (TYLENOL) suppository 650 mg, 650 mg, Rectal, Q4H PRN, Sharrie Self A, MD .  atorvastatin (LIPITOR) tablet 80 mg, 80 mg, Oral, Daily, Dvonte Gatliff A, MD .  ceFAZolin (ANCEF) IVPB 2g/100 mL premix, 2 g, Intravenous, Q8H, Eames Dibiasio, 2060, MD, Stopped at 12/03/18 12/05/18 .  Chlorhexidine Gluconate Cloth 2 % PADS 6 each, 6 each, Topical, Daily, 8250, MD, 6 each at 12/02/18 2019 .  docusate sodium (COLACE) capsule 100 mg, 100 mg, Oral, BID, Mistey Hoffert A, MD, 100 mg at 12/02/18 2130 .  famotidine (PEPCID) IVPB 20 mg premix, 20 mg, Intravenous,  Q12H, 2131, MD, Stopped at 12/02/18 2206 .  glipiZIDE (GLUCOTROL XL) 24 hr tablet 10 mg, 10 mg, Oral, BID WC, Nur Krasinski, 2207, MD, 10 mg at 12/03/18 0840 .  [START ON 12/04/2018] heparin injection 5,000 Units, 5,000 Units, Subcutaneous, Q8H, Liana Camerer A, MD .  HYDROcodone-acetaminophen (NORCO/VICODIN) 5-325 MG per tablet 1 tablet, 1 tablet, Oral, Q4H PRN, 12/06/2018, MD, 1 tablet at 12/02/18 2229 .  HYDROmorphone (DILAUDID) injection 0.5 mg, 0.5 mg, Intravenous, Q3H PRN, Kyrianna Barletta A, MD .  labetalol (NORMODYNE) injection 10-40 mg, 10-40 mg, Intravenous, Q10 min PRN, Tremaine Earwood, 09[birth year]1996, MD .  lactated ringers infusion, , Intravenous, Continuous, Clovis Pu, MD, Last Rate: 10 mL/hr at 12/03/18 0700 .  levETIRAcetam (KEPPRA) tablet 500 mg, 500 mg, Oral, BID, 12/05/18, MD, 500 mg at 12/02/18 2130 .  metoprolol succinate (TOPROL-XL) 24 hr tablet 100 mg, 100 mg, Oral, Daily, Yareth Macdonnell A, MD .  ondansetron (ZOFRAN) tablet 4 mg, 4 mg, Oral, Q4H PRN **OR** ondansetron (ZOFRAN) injection 4 mg, 4 mg, Intravenous, Q4H PRN, Jerzey Komperda A, MD .  polyethylene glycol (MIRALAX / GLYCOLAX) packet 17 g, 17 g, Oral, Daily PRN, Render Marley,  Joyice Faster, MD .  promethazine (PHENERGAN) tablet 12.5-25 mg, 12.5-25 mg, Oral, Q4H PRN, Judith Part, MD .  tamsulosin (FLOMAX) capsule 0.4 mg, 0.4 mg, Oral, QPC supper, Judith Part, MD, 0.4 mg at 12/02/18 2059   Physical Exam: AOx3, PERRL, EOMI, FS, Strength 5/5 x4, SILTx4 Incisions c/d/i  Assessment & Plan: 68 y.o. man w/ NPH s/p VPS placement, recovering well.  -post-op CTH shows catheter in good position -d/c foley, mobilize, discharge today  Judith Part  12/03/18 8:42 AM

## 2018-12-04 ENCOUNTER — Telehealth: Payer: Self-pay

## 2018-12-04 ENCOUNTER — Ambulatory Visit: Payer: Medicare HMO | Admitting: Family Medicine

## 2018-12-04 DIAGNOSIS — R413 Other amnesia: Secondary | ICD-10-CM | POA: Diagnosis not present

## 2018-12-04 DIAGNOSIS — N39498 Other specified urinary incontinence: Secondary | ICD-10-CM | POA: Diagnosis not present

## 2018-12-04 DIAGNOSIS — G912 (Idiopathic) normal pressure hydrocephalus: Secondary | ICD-10-CM | POA: Diagnosis not present

## 2018-12-04 DIAGNOSIS — R4189 Other symptoms and signs involving cognitive functions and awareness: Secondary | ICD-10-CM | POA: Diagnosis not present

## 2018-12-04 DIAGNOSIS — Z48811 Encounter for surgical aftercare following surgery on the nervous system: Secondary | ICD-10-CM | POA: Diagnosis not present

## 2018-12-04 DIAGNOSIS — S065X0S Traumatic subdural hemorrhage without loss of consciousness, sequela: Secondary | ICD-10-CM | POA: Diagnosis not present

## 2018-12-04 DIAGNOSIS — G40909 Epilepsy, unspecified, not intractable, without status epilepticus: Secondary | ICD-10-CM | POA: Diagnosis not present

## 2018-12-04 DIAGNOSIS — R2689 Other abnormalities of gait and mobility: Secondary | ICD-10-CM | POA: Diagnosis not present

## 2018-12-04 DIAGNOSIS — E1122 Type 2 diabetes mellitus with diabetic chronic kidney disease: Secondary | ICD-10-CM | POA: Diagnosis not present

## 2018-12-04 NOTE — Telephone Encounter (Signed)
Copied from [place] [phone]. Topic: General - Inquiry >> Dec 04, 2018  4:23 PM Richardo Priest, [place] wrote: Reason for CRM: Roselie Awkward, PT with [facility], called in wanting to inform PCP that patient was evaluated and will see patient 1 week 1 and 2 week 4 for strengthening and balance. Initial order for PT and OT came from hospital. Call back is [phone].

## 2018-12-05 NOTE — Telephone Encounter (Signed)
Verbal orders given  

## 2018-12-08 ENCOUNTER — Ambulatory Visit (INDEPENDENT_AMBULATORY_CARE_PROVIDER_SITE_OTHER): Payer: Medicare HMO | Admitting: Psychology

## 2018-12-08 DIAGNOSIS — N39498 Other specified urinary incontinence: Secondary | ICD-10-CM | POA: Diagnosis not present

## 2018-12-08 DIAGNOSIS — G912 (Idiopathic) normal pressure hydrocephalus: Secondary | ICD-10-CM | POA: Diagnosis not present

## 2018-12-08 DIAGNOSIS — F4323 Adjustment disorder with mixed anxiety and depressed mood: Secondary | ICD-10-CM

## 2018-12-08 DIAGNOSIS — R413 Other amnesia: Secondary | ICD-10-CM | POA: Diagnosis not present

## 2018-12-08 DIAGNOSIS — R2689 Other abnormalities of gait and mobility: Secondary | ICD-10-CM | POA: Diagnosis not present

## 2018-12-08 DIAGNOSIS — R4189 Other symptoms and signs involving cognitive functions and awareness: Secondary | ICD-10-CM | POA: Diagnosis not present

## 2018-12-08 DIAGNOSIS — Z48811 Encounter for surgical aftercare following surgery on the nervous system: Secondary | ICD-10-CM | POA: Diagnosis not present

## 2018-12-08 DIAGNOSIS — S065X0S Traumatic subdural hemorrhage without loss of consciousness, sequela: Secondary | ICD-10-CM | POA: Diagnosis not present

## 2018-12-08 DIAGNOSIS — G40909 Epilepsy, unspecified, not intractable, without status epilepticus: Secondary | ICD-10-CM | POA: Diagnosis not present

## 2018-12-08 DIAGNOSIS — E1122 Type 2 diabetes mellitus with diabetic chronic kidney disease: Secondary | ICD-10-CM | POA: Diagnosis not present

## 2018-12-09 DIAGNOSIS — G912 (Idiopathic) normal pressure hydrocephalus: Secondary | ICD-10-CM | POA: Diagnosis not present

## 2018-12-09 DIAGNOSIS — R4189 Other symptoms and signs involving cognitive functions and awareness: Secondary | ICD-10-CM | POA: Diagnosis not present

## 2018-12-09 DIAGNOSIS — Z48811 Encounter for surgical aftercare following surgery on the nervous system: Secondary | ICD-10-CM | POA: Diagnosis not present

## 2018-12-09 DIAGNOSIS — E1122 Type 2 diabetes mellitus with diabetic chronic kidney disease: Secondary | ICD-10-CM | POA: Diagnosis not present

## 2018-12-09 DIAGNOSIS — N39498 Other specified urinary incontinence: Secondary | ICD-10-CM | POA: Diagnosis not present

## 2018-12-09 DIAGNOSIS — R413 Other amnesia: Secondary | ICD-10-CM | POA: Diagnosis not present

## 2018-12-09 DIAGNOSIS — G40909 Epilepsy, unspecified, not intractable, without status epilepticus: Secondary | ICD-10-CM | POA: Diagnosis not present

## 2018-12-09 DIAGNOSIS — R2689 Other abnormalities of gait and mobility: Secondary | ICD-10-CM | POA: Diagnosis not present

## 2018-12-09 DIAGNOSIS — S065X0S Traumatic subdural hemorrhage without loss of consciousness, sequela: Secondary | ICD-10-CM | POA: Diagnosis not present

## 2018-12-12 DIAGNOSIS — E1122 Type 2 diabetes mellitus with diabetic chronic kidney disease: Secondary | ICD-10-CM | POA: Diagnosis not present

## 2018-12-12 DIAGNOSIS — R413 Other amnesia: Secondary | ICD-10-CM | POA: Diagnosis not present

## 2018-12-12 DIAGNOSIS — G40909 Epilepsy, unspecified, not intractable, without status epilepticus: Secondary | ICD-10-CM | POA: Diagnosis not present

## 2018-12-12 DIAGNOSIS — R2689 Other abnormalities of gait and mobility: Secondary | ICD-10-CM | POA: Diagnosis not present

## 2018-12-12 DIAGNOSIS — N39498 Other specified urinary incontinence: Secondary | ICD-10-CM | POA: Diagnosis not present

## 2018-12-12 DIAGNOSIS — R4189 Other symptoms and signs involving cognitive functions and awareness: Secondary | ICD-10-CM | POA: Diagnosis not present

## 2018-12-12 DIAGNOSIS — S065X0S Traumatic subdural hemorrhage without loss of consciousness, sequela: Secondary | ICD-10-CM | POA: Diagnosis not present

## 2018-12-12 DIAGNOSIS — Z48811 Encounter for surgical aftercare following surgery on the nervous system: Secondary | ICD-10-CM | POA: Diagnosis not present

## 2018-12-12 DIAGNOSIS — G912 (Idiopathic) normal pressure hydrocephalus: Secondary | ICD-10-CM | POA: Diagnosis not present

## 2018-12-15 ENCOUNTER — Ambulatory Visit (INDEPENDENT_AMBULATORY_CARE_PROVIDER_SITE_OTHER): Payer: Medicare HMO | Admitting: Psychology

## 2018-12-15 DIAGNOSIS — N39498 Other specified urinary incontinence: Secondary | ICD-10-CM | POA: Diagnosis not present

## 2018-12-15 DIAGNOSIS — F4323 Adjustment disorder with mixed anxiety and depressed mood: Secondary | ICD-10-CM

## 2018-12-15 DIAGNOSIS — G912 (Idiopathic) normal pressure hydrocephalus: Secondary | ICD-10-CM | POA: Diagnosis not present

## 2018-12-15 DIAGNOSIS — E1122 Type 2 diabetes mellitus with diabetic chronic kidney disease: Secondary | ICD-10-CM | POA: Diagnosis not present

## 2018-12-15 DIAGNOSIS — R4189 Other symptoms and signs involving cognitive functions and awareness: Secondary | ICD-10-CM | POA: Diagnosis not present

## 2018-12-15 DIAGNOSIS — R2689 Other abnormalities of gait and mobility: Secondary | ICD-10-CM | POA: Diagnosis not present

## 2018-12-15 DIAGNOSIS — S065X0S Traumatic subdural hemorrhage without loss of consciousness, sequela: Secondary | ICD-10-CM | POA: Diagnosis not present

## 2018-12-15 DIAGNOSIS — R413 Other amnesia: Secondary | ICD-10-CM | POA: Diagnosis not present

## 2018-12-15 DIAGNOSIS — Z48811 Encounter for surgical aftercare following surgery on the nervous system: Secondary | ICD-10-CM | POA: Diagnosis not present

## 2018-12-15 DIAGNOSIS — G40909 Epilepsy, unspecified, not intractable, without status epilepticus: Secondary | ICD-10-CM | POA: Diagnosis not present

## 2018-12-15 NOTE — Progress Notes (Signed)
[facility] [address], [address] [place], [place] [address] [phone] [phone]  Date:  12/18/2018   Name:  [name] ShutterDonald G Mackins Jr.   DOB:  [birth year]   MRN:  [id number]  PCP:  Pearline Cablesopland, Remigio Mcmillon C, MD    Chief Complaint: Subdural Hematoma   History of Present Illness:  [name] ShutterDonald G Yodice Jr. is a 68 y.o. very pleasant male patient who presents with the following:  Here today for periodic follow-up visit Diabetes, hypertension, mild renal insufficiency  [name] have been generally in good health, until he had a very unfortunate fall at home on May 4 of this year.  He sustained this traumatic brain injury and subdural hematoma as well as rib fractures.  He then suffered a seizure thought due to brain hematoma breakdown products.  He went to rehab for about 1 month, returned home to live with his wife.  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 Terrace ArabiaYan picked up on hydrocephalus on MRI last month: IMPRESSION: This MRI of the brain without contrast shows the following: 1.   There has been resolution of the subdural hematoma noted on the left on the 07/29/2018 MRI. 2.   There is ventriculomegaly out of proportion to the extent of mild generalized cortical atrophy.   Additionally, there appears to be mild transependymal flow.  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   There are no acute findings.  She referred him for treatment and shot, they followed up in the office 2 days ago: History of fall on Jul 14, 2018, with left convexity subdural hematoma, subarachnoid hemorrhage Evidence of normal pressure hydrocephalus hydrocephalus Complex partial seizure Status post VP shunt placement on December 02, 2018             Continue Keppra 500 mg twice a day              Continue physical therapy             Return to clinic in 3 months       [facility] shot: will [facility] at [facility]  Colon cancer screening:  Lipitor 80 Glucotrol Keppra Metoprolol  His last A1c was quite a bit higher, 9.6%.  We discussed this today, he is taking glipizide.  He has used metformin in the past and tolerated it well.  He is willing to go back on this for better glucose control  He is doing so much better since his surgery He has occasional headache but nothing serious No seizure He is doing PT at home He is able to shower and dress without help  He is able to fix a simple meal  He is sleeping well per his report He will get sleepy around 9pm, fall asleep at 9:30 He gets up about 8- 8:30 am  Per his wife his condition has been stable over the past week-she noted a dramatic improvement in the week or 2 following his shunt placement.  Overall the patient and his wife are feeling much more hopeful and positive They are not checking his glucose at home but do have a glucometer  He has not noted symptoms of orthostatic hypotension  Lab Results  Component Value Date   HGBA1C 9.6 (H) 11/28/2018     Patient Active Problem List  Diagnosis Date Noted  . Unspecified abnormalities of gait and mobility 12/16/2018  . 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 Normal pressure hydrocephalus (HCC) 12/02/2018  . History of subdural hematoma 11/18/2018  . Urinary incontinence 11/18/2018  . Memory loss 10/09/2018  . Right hemiparesis (HCC) 09/04/2018  . Cognitive deficit as late effect of traumatic brain injury (HCC) 09/04/2018  . Bradycardia   . Leukopenia   . Hypoalbuminemia due to protein-calorie malnutrition (HCC)   . Hydrocele   . Hydrocephalus (HCC) 07/28/2018  . Mild renal insufficiency 07/28/2018  . Paroxysmal atrial fibrillation (HCC) 07/28/2018  . Subdural hematoma (HCC) 07/14/2018  . Hypertension, essential 01/16/2018  . Controlled type 2 diabetes  mellitus without complication, without long-term current use of insulin (HCC) 01/16/2018  . Coronary artery disease involving native heart without angina pectoris 01/16/2018    Past Medical History:  Diagnosis Date  . Abnormality of gait    uses walker and wheelchair  . Cognitive deficit as late effect of traumatic brain injury (HCC)   . Coronary artery disease    cardiologist-- dr s. Dickie La---  04-06-2014  NSTEMI  s/p  cardiac cath DES x1 to LAD and POBA to D1  . Dysrhythmia   . History of non-ST elevation myocardial infarction (NSTEMI)    04-06-2013  s/p  coronary stent and PCI  . Hydrocele, bilateral   . Hyperlipemia   . Hypertension   . Lower urinary tract symptoms (LUTS)   . Memory deficit   . PAF (paroxysmal atrial fibrillation) (HCC) cardilogist-- dr Dickie La   on-set 05/ 2020 during admission for [facility],  w/ RVR,  with cardizem/ toprol, back in NSR,  f/u by cardiologist , event monitor 09-16-2018 no results in epic by per pt wife was told result ok with no afib  . S/P drug eluting coronary stent placement    04-06-2014  @[facility]   DES x1 to LAD and POBA to D1  . SAH (subarachnoid hemorrhage) [facility]) neurologist-  dr [facility]--- last head CT 10-01-2018 resolving (10-23-2018  residual deficits memory issues, abnormal gait, congnitive   w/ SDH due to unwitnessed fall, traumatic head injury---- admission 07-14-2018 , d/c'd 08-01-2018,  readmitted 3 days later with late TBI effects,  d/c'd from rehab 08-22-2018  . Seizure disorder [facility])    followed by dr [facility]  . Subdural hematoma (HCC)   . Type 2 diabetes mellitus (HCC)    followed by pcp  . 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 CATARACT EXTRACTION W/ INTRAOCULAR LENS  IMPLANT, BILATERAL  2008; 2009  . CORONARY ANGIOPLASTY WITH STENT PLACEMENT  04-06-2014  @[facility]    DES x1 to LAD and POBA to D1  . HYDROCELE EXCISION Bilateral 10/27/2018   Procedure: HYDROCELECTOMY ADULT;   Surgeon: , MD;  Location: [facility];  Service: Urology;  Laterality: Bilateral;  . TONSILLECTOMY  child  . 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   Procedure: Right ventriculoperitoneal shunt placement;  Surgeon: [facility] MD;  Location: [facility] OR;  Service: Neurosurgery;  Laterality: Right;    Social History   Tobacco Use  . Smoking status: Never Smoker  . Smokeless tobacco: Never Used  Substance Use Topics  . Alcohol use: Not Currently    Comment: VERY RARELY  . Drug use: Never    Family History  Problem Relation Age of Onset  . Stroke Father   . Hypertension Father   . Hyperlipidemia  Father   . Early death Mother   . Colitis Sister   . Appendicitis Brother     No Known Allergies  Medication list has been reviewed and updated.  Current Outpatient Medications on File Prior to Visit  Medication Sig Dispense Refill  . atorvastatin (LIPITOR) 80 MG tablet Take 1 tablet (80 mg total) by mouth daily. 30 tablet 1  . glipiZIDE (GLUCOTROL XL) 10 MG 24 hr tablet Take 1 tablet (10 mg total) by mouth 2 (two) times daily. 60 tablet 1  . levETIRAcetam (KEPPRA) 500 MG tablet Take 1 tablet (500 mg total) by mouth 2 (two) times daily. 60 tablet 11  . metoprolol succinate (TOPROL-XL) 100 MG 24 hr tablet Take 1 tablet (100 mg total) by mouth daily. Take with or immediately following a meal. 30 tablet 5  . tamsulosin (FLOMAX) 0.4 MG CAPS capsule Take 1 capsule (0.4 mg total) by mouth daily after supper. 30 capsule 1  . acetaminophen (TYLENOL) 325 MG tablet Take 2 tablets (650 mg total) by mouth every 6 (six) hours as needed for mild pain. (Patient not taking: Reported on 12/18/2018)     No current facility-administered medications on file prior to visit.     Review of Systems:  As per HPI- otherwise negative. No fever or chills  Physical Examination: Vitals:   12/18/18 1109  BP: (!) 108/54  Pulse: 76  Resp: 16  Temp: (!) 97.3 F (36.3 C)   SpO2: 97%   Vitals:   12/18/18 1109  Weight: 203 lb (92.1 kg)  Height: 5\' 9"  (1.753 m)   Body mass index is 29.98 kg/m. Ideal Body Weight: Weight in (lb) to have BMI = 25: 168.9  GEN: WDWN, NAD, Non-toxic, A & O x 3, overweight, looks well and more alert than he has recently HEENT: Atraumatic, Normocephalic. Neck supple. No masses, No LAD. Ears and Nose: No external deformity. CV: RRR, No M/G/R. No JVD. No thrill. No extra heart sounds. PULM: CTA B, no wheezes, crackles, rhonchi. No retractions. No resp. distress. No accessory muscle use. ABD: S, NT, ND, +BS. No rebound. No HSM. EXTR: No c/c/e NEURO slow gait, take small steps.  However he is walking on his own with a walker Good strength of all 4 limbs on exam PSYCH: Normally interactive. Conversant. Not depressed or anxious appearing.  Calm demeanor.  He is able to answer questions, and even makes a few jokes during the visit today  Wt Readings from Last 3 Encounters:  12/18/18 203 lb (92.1 kg)  12/16/18 194 lb 8 oz (88.2 kg)  12/02/18 220 lb (99.8 kg)   BP Readings from Last 3 Encounters:  12/18/18 (!) 108/54  12/16/18 109/63  12/03/18 (!) 128/112    Assessment and Plan: Subdural hematoma (HCC)  Controlled type 2 diabetes mellitus without complication, without long-term current use of insulin ([place]) - Plan: metFORMIN (GLUCOPHAGE) 500 MG tablet  Hypertension, essential  Gait disturbance  Paroxysmal atrial fibrillation ([place])  Following up today for various chronic health conditions Blood pressure is on the low side today.  I have asked him to monitor this, if this worsens or if he experiences symptoms can decrease dose of metoprolol He continues to work hard with his physical therapist, and is making good progress.  Praised his efforts His wife also seems much more hopeful today We will add metformin for blood sugar control Recheck in 2 months for A1c  Signed Lamar Blinks, MD

## 2018-12-16 ENCOUNTER — Ambulatory Visit (INDEPENDENT_AMBULATORY_CARE_PROVIDER_SITE_OTHER): Payer: Medicare HMO | Admitting: Neurology

## 2018-12-16 ENCOUNTER — Encounter: Payer: Self-pay | Admitting: Neurology

## 2018-12-16 ENCOUNTER — Other Ambulatory Visit: Payer: Self-pay

## 2018-12-16 VITALS — BP 109/63 | HR 64 | Temp 98.2°F | Ht 69.0 in | Wt 194.5 lb

## 2018-12-16 DIAGNOSIS — G912 (Idiopathic) normal pressure hydrocephalus: Secondary | ICD-10-CM | POA: Diagnosis not present

## 2018-12-16 DIAGNOSIS — R269 Unspecified abnormalities of gait and mobility: Secondary | ICD-10-CM

## 2018-12-16 NOTE — Progress Notes (Signed)
PATIENT: [name] Frank. DOB: 01-27-1951  Chief Complaint  Patient presents with  . Hydrocephalus/Seizures    He is here with his daughter, Nicki Reaper. He has been seen by Dr. Zada Finders.       HISTORICAL  [name] Frank. is a 68 year old male, seen in request by his primary care physician Dr. Lamar Blinks for evaluation of possible seizure, history of subdural hematoma, initial evaluation was on October 09, 2018.  I have reviewed and summarized the referring note from the referring physician.  He had a past medical history of hypertension, type 2 diabetes, coronary artery disease, paroxysmal atrial fibrillation, he had an unwitnessed fall at the bottom of the stairs, was brought to the emergency room on Jul 14, 2018, I personally reviewed CT head, acute subdural hematoma along the left cerebral convexity measuring up to 11 mm in thickness, patchy subarachnoid hemorrhage with a traumatic pattern, negative for cervical spine fracture.  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  He denies gait abnormality, memory loss at that time.  He readmitted to the hospital on Jul 28, 2018 for more somnolent, slept all day on May 16 sudden onset garbled speech, right facial droop, transient confusion, emergency room blood pressure 90/53, CT head showed developing community hydrocephalus, likely due to recent subarachnoid and intraventricular hemorrhage, no surgical intervention was indicated per neurosurgery evaluation, suspicious for complex partial seizure, he was discharged with Keppra 500 mg twice a day  Since recent hospital admission, there was no clinical seizure, but he was noted  significant memory loss, he states, repeating questions, slept a lot, lost control of his urine, also has mild gait abnormality,  He is always a slow walker, he tends to wait on him, by reviewing the initial CT scan on Jul 14, 2018, there is already some evidence of hydrocephalus, enlarged bilateral lateral ventricle,  I personally reviewed MRI of the brain on Jul 29, 2018, persistent ventriculomegaly, suspected developing communicating hydrocephalus, no evidence of acute stroke, 10 mm left subdural hematoma, with no mass-effect on the hemisphere,  Laboratory evaluations showed normal ESR, C-reactive protein, ANA, HIV, RPR, B12, A1c was 7.2, hemoglobin was 12.8,   Update November 19, 2018: He continues to have significant memory loss, gait abnormality, worsening urinary incontinence, this is all new since May 2020, he retired as an Engineer, 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[employer], later worked as a Microbiologist, just finished a complicated project in April 2020, now he has difficulty operating coffee machine, difficulty initiating his gait, fell few times,  We have personally reviewed MRI CT scans, compared with multiple scans he had a since May 2020, most recent MRI brain November 15, 2018: Resolution of left subdural hematoma, ventriculomegaly out of proportion to the extent of mild generalized cortical atrophy, additionally there appears to be mild transependymal flow, 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 Zada Finders, will see him on September 9  He is tolerating Keppra 500 mg twice daily well, there was no recurrent seizure  He has tried Sinemet 25/100 mg 3 times daily, there was no significant improvement noted  UPDATE Oct 6th 2020: I reviewed operation record by Dr. Zada Finders, he had  shunt replacement December 02, 2018.  He was seen by physical therapy on December 03, 2018, the day postsurgical, patient with significant posterior lean, poor trunk control, required bilateral upper extremity support,  needs assistance, he reported overall 60% improvement in his walking, urinary frequency and urgency has much improved, still has mild memory loss  I reviewed CT head December 03, 2018: Operative changes from interval right bur hole craniotomy for VP shunt placement,  REVIEW OF SYSTEMS: Full 14 system review of systems performed and notable only for as above All other review of systems were negative.  ALLERGIES: No Known Allergies  HOME MEDICATIONS: Current Outpatient Medications  Medication Sig Dispense Refill  . acetaminophen (TYLENOL) 325 MG tablet Take 2 tablets (650 mg total) by mouth every 6 (six) hours as needed for mild pain.    Marland Kitchen atorvastatin (LIPITOR) 80 MG tablet Take 1 tablet (80 mg total) by mouth daily. 30 tablet 1  . docusate sodium (COLACE) 100 MG capsule Take 1 capsule (100 mg total) by mouth 2 (two) times daily. For constipation (Patient taking differently: Take 100 mg by mouth daily as needed for mild constipation. ) 60 capsule 0  . glipiZIDE (GLUCOTROL XL) 10 MG 24 hr tablet Take 1 tablet (10 mg total) by mouth 2 (two) times daily. 60 tablet 1  . HYDROcodone-acetaminophen (NORCO/VICODIN) 5-325 MG tablet Take 1 tablet by mouth every 4 (four) hours as needed for moderate pain. 30 tablet 0  . levETIRAcetam (KEPPRA) 500 MG tablet Take 1 tablet (500 mg total) by mouth 2 (two) times daily. 60 tablet 11  . metoprolol succinate (TOPROL-XL) 100 MG 24 hr tablet Take 1 tablet (100 mg total) by mouth daily. Take with or immediately following a meal. 30 tablet 5  . tamsulosin (FLOMAX) 0.4 MG CAPS capsule Take 1 capsule (0.4 mg total) by mouth daily after supper. 30 capsule 1   No current facility-administered medications for this visit.     PAST MEDICAL HISTORY:  Past Medical History:  Diagnosis Date  . Abnormality of gait    uses walker and wheelchair  . Cognitive deficit as late effect of traumatic brain injury ([place])   . Coronary artery disease    cardiologist-- dr s. Jake Bathe---  04-06-2014  NSTEMI  s/p  cardiac cath DES x1 to LAD and POBA to D1  . Dysrhythmia   . History of non-ST elevation myocardial infarction (NSTEMI)    04-06-2013  s/p  coronary stent and PCI  . Hydrocele, bilateral   . Hyperlipemia   . Hypertension   . Lower urinary tract symptoms (LUTS)   . Memory deficit   . PAF (paroxysmal atrial fibrillation) ([place]) cardilogist-- dr Jake Bathe   on-set 05/ 2020 during admission for [facility],  w/ RVR,  with cardizem/ toprol, back in NSR,  f/u by cardiologist , event monitor 09-16-2018 no results in epic by per pt wife was told result ok with no afib  . S/P drug eluting coronary stent placement    04-06-2014  _0   DES x1 to LAD and POBA to D1  . SAH (subarachnoid hemorrhage) [facility]) neurologist-  dr Krista Blue--- last head CT 10-01-2018 resolving (10-23-2018  residual deficits memory issues, abnormal gait, congnitive   w/ SDH due to unwitnessed fall, traumatic head injury---- admission 07-14-2018 , d/c'd 08-01-2018,  readmitted 3 days later with  late TBI effects,  d/c'd from rehab 08-22-2018  . Seizure disorder [facility])    followed by dr Krista Blue  . Subdural hematoma ([place])   . Type 2 diabetes mellitus ([place])    followed by pcp  . 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 CATARACT EXTRACTION W/ INTRAOCULAR LENS  IMPLANT, BILATERAL  2008; 2009  . CORONARY ANGIOPLASTY WITH STENT PLACEMENT  04-06-2014  _0    DES x1 to LAD and POBA to D1  . HYDROCELE EXCISION Bilateral 10/27/2018   Procedure: HYDROCELECTOMY ADULT;  Surgeon: [name], MD;  [facility]  Service: Urology;  Laterality: Bilateral;  . TONSILLECTOMY  child  . VENTRICULOPERITONEAL  SHUNT Right 12/02/2018   Procedure: Right ventriculoperitoneal shunt placement;  Surgeon: Judith Part, MD;  Location: [place];  Service: Neurosurgery;  Laterality: Right;    FAMILY HISTORY: Family History  Problem Relation Age of Onset  . Stroke Father   . Hypertension Father   . Hyperlipidemia Father   . Early death Mother   . Colitis Sister   . Appendicitis Brother     SOCIAL HISTORY: Social History   Socioeconomic History  . Marital status: Married    Spouse name: Not on file  . Number of children: 2  . Years of education: college  . Highest education level: Master's degree (e.g., MA, MS, MEng, MEd, MSW, MBA)  Occupational History  . Occupation: Retired  Scientific laboratory technician  . Financial resource strain: Not on file  . Food insecurity    Worry: Not on file    Inability: Not on file  . Transportation needs    Medical: Not on file    Non-medical: Not on file  Tobacco Use  . Smoking status: Never Smoker  . Smokeless tobacco: Never Used  Substance and Sexual Activity  . Alcohol use: Not Currently    Comment: VERY RARELY  . Drug use: Never  . Sexual activity: Not on file  Lifestyle  . Physical activity    Days per week: Not on file    Minutes per session: Not on file  . Stress: Not on file  Relationships  . Social Herbalist on phone: Not on file    Gets together: Not on file    Attends religious service: Not on file    Active member of club or organization: Not on file    Attends meetings of clubs or organizations: Not on file    Relationship status: Not on file  . Intimate partner violence    Fear of current or ex partner: Not on file    Emotionally abused: Not on file    Physically abused: Not on file    Forced sexual activity: Not on file  Other Topics Concern  . Not on file  Social History Narrative   Lives at home with his wife.   Right-handed.   Limited caffeine use.     PHYSICAL EXAM   Vitals:   12/16/18 1112  Temp: 98.2 F (36.8  C)  Weight: 194 lb 8 oz (88.2 kg)  Height: _1  (1.753 m)    Not recorded      Body mass index is 28.72 kg/m.  PHYSICAL EXAMNIATION:  Gen: NAD, conversant, well nourised,well groomed                     Cardiovascular: Regular rate  rhythm, no peripheral edema, warm, nontender. Eyes: Conjunctivae clear without exudates or hemorrhage Neck: Supple, no carotid bruits. Pulmonary: Clear to auscultation bilaterally   NEUROLOGICAL EXAM:  MENTAL STATUS:  Speech/speech: Rely on his wife to provide most history, alert, following command CRANIAL NERVES: CN II: Visual fields are full to confrontation.  Pupils are round equal and briskly reactive to light. CN III, IV, VI: extraocular movement are normal. No ptosis. CN V: Facial sensation is intact to pinprick in all 3 divisions bilaterally. Corneal responses are intact.  CN VII: Face is symmetric with normal eye closure and smile. CN VIII: Hearing is normal to casual conversation CN IX, X: Palate elevates symmetrically. Phonation is normal. CN XI: Head turning and shoulder shrug are intact CN XII: Tongue is midline with normal movements and no atrophy.  MOTOR: No significant weakness, right more than left rigidity, mild bradykinesia  REFLEXES: Reflexes are 1 and symmetric at the biceps, triceps, knees, and ankles. Plantar responses are flexor.  SENSORY: Intact to light touch,  COORDINATION: Rapid alternating movements and fine finger movements are intact. There is no dysmetria on finger-to-nose and heel-knee-shin.    GAIT/STANCE: Need push-up to get up from seated position, difficulty initiate gait, there was no longer have significant magnetic gait, unsteady, wide-based, leaning forward  DIAGNOSTIC DATA (LABS, IMAGING, TESTING) - I reviewed patient records, labs, notes, testing and imaging myself where available.   ASSESSMENT AND PLAN  [name] Frank. is a 68 y.o. 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 Ph.D.  [facility] [address], [place], [place] [address] Ph: [phone] Fax: [phone]  CC: Copland, Gay Filler, MD

## 2018-12-18 ENCOUNTER — Encounter: Payer: Self-pay | Admitting: Family Medicine

## 2018-12-18 ENCOUNTER — Ambulatory Visit (INDEPENDENT_AMBULATORY_CARE_PROVIDER_SITE_OTHER): Payer: Medicare HMO | Admitting: Family Medicine

## 2018-12-18 ENCOUNTER — Other Ambulatory Visit: Payer: Self-pay

## 2018-12-18 VITALS — BP 108/54 | HR 76 | Temp 97.3°F | Resp 16 | Ht 69.0 in | Wt 203.0 lb

## 2018-12-18 DIAGNOSIS — S2249XA Multiple fractures of ribs, unspecified side, initial encounter for closed fracture: Secondary | ICD-10-CM | POA: Diagnosis not present

## 2018-12-18 DIAGNOSIS — N39498 Other specified urinary incontinence: Secondary | ICD-10-CM | POA: Diagnosis not present

## 2018-12-18 DIAGNOSIS — G912 (Idiopathic) normal pressure hydrocephalus: Secondary | ICD-10-CM | POA: Diagnosis not present

## 2018-12-18 DIAGNOSIS — G40909 Epilepsy, unspecified, not intractable, without status epilepticus: Secondary | ICD-10-CM | POA: Diagnosis not present

## 2018-12-18 DIAGNOSIS — S065X9A Traumatic subdural hemorrhage with loss of consciousness of unspecified duration, initial encounter: Secondary | ICD-10-CM | POA: Diagnosis not present

## 2018-12-18 DIAGNOSIS — I10 Essential (primary) hypertension: Secondary | ICD-10-CM | POA: Diagnosis not present

## 2018-12-18 DIAGNOSIS — E119 Type 2 diabetes mellitus without complications: Secondary | ICD-10-CM

## 2018-12-18 DIAGNOSIS — I480 Paroxysmal atrial fibrillation: Secondary | ICD-10-CM | POA: Diagnosis not present

## 2018-12-18 DIAGNOSIS — S065X0S Traumatic subdural hemorrhage without loss of consciousness, sequela: Secondary | ICD-10-CM | POA: Diagnosis not present

## 2018-12-18 DIAGNOSIS — R269 Unspecified abnormalities of gait and mobility: Secondary | ICD-10-CM | POA: Diagnosis not present

## 2018-12-18 DIAGNOSIS — R413 Other amnesia: Secondary | ICD-10-CM | POA: Diagnosis not present

## 2018-12-18 DIAGNOSIS — Z48811 Encounter for surgical aftercare following surgery on the nervous system: Secondary | ICD-10-CM | POA: Diagnosis not present

## 2018-12-18 DIAGNOSIS — S065XAA Traumatic subdural hemorrhage with loss of consciousness status unknown, initial encounter: Secondary | ICD-10-CM

## 2018-12-18 DIAGNOSIS — R2689 Other abnormalities of gait and mobility: Secondary | ICD-10-CM | POA: Diagnosis not present

## 2018-12-18 DIAGNOSIS — R4189 Other symptoms and signs involving cognitive functions and awareness: Secondary | ICD-10-CM | POA: Diagnosis not present

## 2018-12-18 DIAGNOSIS — E1122 Type 2 diabetes mellitus with diabetic chronic kidney disease: Secondary | ICD-10-CM | POA: Diagnosis not present

## 2018-12-18 MED ORDER — METFORMIN HCL 500 MG PO TABS: 500.0000 mg | ORAL_TABLET | Freq: Two times a day (BID) | ORAL | 3 refills | Status: DC

## 2018-12-18 NOTE — Patient Instructions (Addendum)
It was great to see you again today, I am so happy to see the progress that you have made Please continue to work hard with physical therapy, and I think we will see continued improvement  Your last A1c-average blood sugar over the previous 3 months-was too high.  Continue glipizide, we are going to add metformin Start with metformin 500 mg once a day, perhaps with dinner.  If you tolerate this okay, increase to twice a day  Please let me know how this works for you.  If it causes problems as far as diarrhea or excessive stomach upset, we can do something else Please check your home sugar perhaps once a week or so.  This will help [place] keep track of things prior to your next A1c If your sugar is quite high, say greater than 250 or 300, please let me know  Your blood pressure is borderline low today.  It has typically been okay, but if it continues to run low we may need to decrease your metoprolol Please let me know if you have any dizziness or lightheadedness on standing up from seated   Please get your flu shot at [facility] as planned

## 2018-12-21 DIAGNOSIS — S065X9A Traumatic subdural hemorrhage with loss of consciousness of unspecified duration, initial encounter: Secondary | ICD-10-CM | POA: Diagnosis not present

## 2018-12-22 DIAGNOSIS — R413 Other amnesia: Secondary | ICD-10-CM | POA: Diagnosis not present

## 2018-12-22 DIAGNOSIS — N39498 Other specified urinary incontinence: Secondary | ICD-10-CM | POA: Diagnosis not present

## 2018-12-22 DIAGNOSIS — G40909 Epilepsy, unspecified, not intractable, without status epilepticus: Secondary | ICD-10-CM | POA: Diagnosis not present

## 2018-12-22 DIAGNOSIS — R4189 Other symptoms and signs involving cognitive functions and awareness: Secondary | ICD-10-CM | POA: Diagnosis not present

## 2018-12-22 DIAGNOSIS — S065X0S Traumatic subdural hemorrhage without loss of consciousness, sequela: Secondary | ICD-10-CM | POA: Diagnosis not present

## 2018-12-22 DIAGNOSIS — G912 (Idiopathic) normal pressure hydrocephalus: Secondary | ICD-10-CM | POA: Diagnosis not present

## 2018-12-22 DIAGNOSIS — R2689 Other abnormalities of gait and mobility: Secondary | ICD-10-CM | POA: Diagnosis not present

## 2018-12-22 DIAGNOSIS — E1122 Type 2 diabetes mellitus with diabetic chronic kidney disease: Secondary | ICD-10-CM | POA: Diagnosis not present

## 2018-12-22 DIAGNOSIS — Z48811 Encounter for surgical aftercare following surgery on the nervous system: Secondary | ICD-10-CM | POA: Diagnosis not present

## 2018-12-25 ENCOUNTER — Ambulatory Visit (INDEPENDENT_AMBULATORY_CARE_PROVIDER_SITE_OTHER): Payer: Medicare HMO | Admitting: Psychology

## 2018-12-25 DIAGNOSIS — F4323 Adjustment disorder with mixed anxiety and depressed mood: Secondary | ICD-10-CM | POA: Diagnosis not present

## 2018-12-26 DIAGNOSIS — R413 Other amnesia: Secondary | ICD-10-CM | POA: Diagnosis not present

## 2018-12-26 DIAGNOSIS — N39498 Other specified urinary incontinence: Secondary | ICD-10-CM | POA: Diagnosis not present

## 2018-12-26 DIAGNOSIS — Z48811 Encounter for surgical aftercare following surgery on the nervous system: Secondary | ICD-10-CM | POA: Diagnosis not present

## 2018-12-26 DIAGNOSIS — G40909 Epilepsy, unspecified, not intractable, without status epilepticus: Secondary | ICD-10-CM | POA: Diagnosis not present

## 2018-12-26 DIAGNOSIS — S065X0S Traumatic subdural hemorrhage without loss of consciousness, sequela: Secondary | ICD-10-CM | POA: Diagnosis not present

## 2018-12-26 DIAGNOSIS — R2689 Other abnormalities of gait and mobility: Secondary | ICD-10-CM | POA: Diagnosis not present

## 2018-12-26 DIAGNOSIS — R4189 Other symptoms and signs involving cognitive functions and awareness: Secondary | ICD-10-CM | POA: Diagnosis not present

## 2018-12-26 DIAGNOSIS — E1122 Type 2 diabetes mellitus with diabetic chronic kidney disease: Secondary | ICD-10-CM | POA: Diagnosis not present

## 2018-12-26 DIAGNOSIS — G912 (Idiopathic) normal pressure hydrocephalus: Secondary | ICD-10-CM | POA: Diagnosis not present

## 2018-12-29 DIAGNOSIS — R2689 Other abnormalities of gait and mobility: Secondary | ICD-10-CM | POA: Diagnosis not present

## 2018-12-29 DIAGNOSIS — E1122 Type 2 diabetes mellitus with diabetic chronic kidney disease: Secondary | ICD-10-CM | POA: Diagnosis not present

## 2018-12-29 DIAGNOSIS — R4189 Other symptoms and signs involving cognitive functions and awareness: Secondary | ICD-10-CM | POA: Diagnosis not present

## 2018-12-29 DIAGNOSIS — S065X0S Traumatic subdural hemorrhage without loss of consciousness, sequela: Secondary | ICD-10-CM | POA: Diagnosis not present

## 2018-12-29 DIAGNOSIS — R413 Other amnesia: Secondary | ICD-10-CM | POA: Diagnosis not present

## 2018-12-29 DIAGNOSIS — G40909 Epilepsy, unspecified, not intractable, without status epilepticus: Secondary | ICD-10-CM | POA: Diagnosis not present

## 2018-12-29 DIAGNOSIS — N39498 Other specified urinary incontinence: Secondary | ICD-10-CM | POA: Diagnosis not present

## 2018-12-29 DIAGNOSIS — G912 (Idiopathic) normal pressure hydrocephalus: Secondary | ICD-10-CM | POA: Diagnosis not present

## 2018-12-29 DIAGNOSIS — Z48811 Encounter for surgical aftercare following surgery on the nervous system: Secondary | ICD-10-CM | POA: Diagnosis not present

## 2018-12-31 DIAGNOSIS — R4189 Other symptoms and signs involving cognitive functions and awareness: Secondary | ICD-10-CM | POA: Diagnosis not present

## 2018-12-31 DIAGNOSIS — S065X0S Traumatic subdural hemorrhage without loss of consciousness, sequela: Secondary | ICD-10-CM | POA: Diagnosis not present

## 2018-12-31 DIAGNOSIS — G912 (Idiopathic) normal pressure hydrocephalus: Secondary | ICD-10-CM | POA: Diagnosis not present

## 2018-12-31 DIAGNOSIS — N39498 Other specified urinary incontinence: Secondary | ICD-10-CM | POA: Diagnosis not present

## 2018-12-31 DIAGNOSIS — R413 Other amnesia: Secondary | ICD-10-CM | POA: Diagnosis not present

## 2018-12-31 DIAGNOSIS — R2689 Other abnormalities of gait and mobility: Secondary | ICD-10-CM | POA: Diagnosis not present

## 2018-12-31 DIAGNOSIS — E1122 Type 2 diabetes mellitus with diabetic chronic kidney disease: Secondary | ICD-10-CM | POA: Diagnosis not present

## 2018-12-31 DIAGNOSIS — Z48811 Encounter for surgical aftercare following surgery on the nervous system: Secondary | ICD-10-CM | POA: Diagnosis not present

## 2018-12-31 DIAGNOSIS — G40909 Epilepsy, unspecified, not intractable, without status epilepticus: Secondary | ICD-10-CM | POA: Diagnosis not present

## 2019-01-01 ENCOUNTER — Ambulatory Visit: Payer: Medicare HMO | Admitting: Occupational Therapy

## 2019-01-01 ENCOUNTER — Ambulatory Visit: Payer: Medicare HMO

## 2019-01-01 ENCOUNTER — Ambulatory Visit: Payer: Medicare HMO | Admitting: Speech Pathology

## 2019-01-06 ENCOUNTER — Ambulatory Visit (INDEPENDENT_AMBULATORY_CARE_PROVIDER_SITE_OTHER): Payer: Medicare HMO

## 2019-01-06 ENCOUNTER — Other Ambulatory Visit: Payer: Self-pay | Admitting: Neurological Surgery

## 2019-01-06 ENCOUNTER — Other Ambulatory Visit: Payer: Self-pay

## 2019-01-06 DIAGNOSIS — G912 (Idiopathic) normal pressure hydrocephalus: Secondary | ICD-10-CM | POA: Diagnosis not present

## 2019-01-06 IMAGING — CT CT HEAD W/O CM
3 series · 15 of 47 positions shown, 18 images · non-contrast
Comparison: [DATE]

CLINICAL DATA: Normal pressure hydrocephalus. Follow-up.

EXAM:
CT HEAD WITHOUT CONTRAST
TECHNIQUE: Contiguous axial images were obtained from the base of the skull
through the vertex without intravenous contrast.

[Series 2: head wo · axial · 0.45mm/px · z∈[-28,+106]mm · 9 of 33 slices shown, 12 images]
[im 3/33  brain]
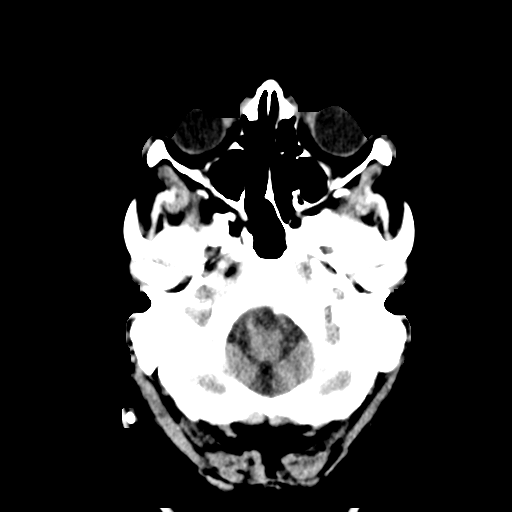
[im 3/33  bone]
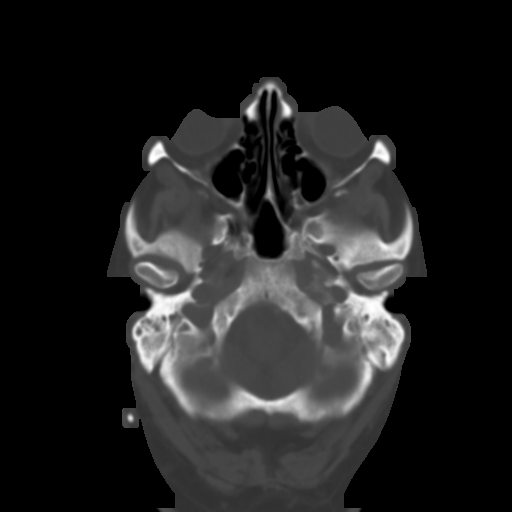
[im 6/33  brain]
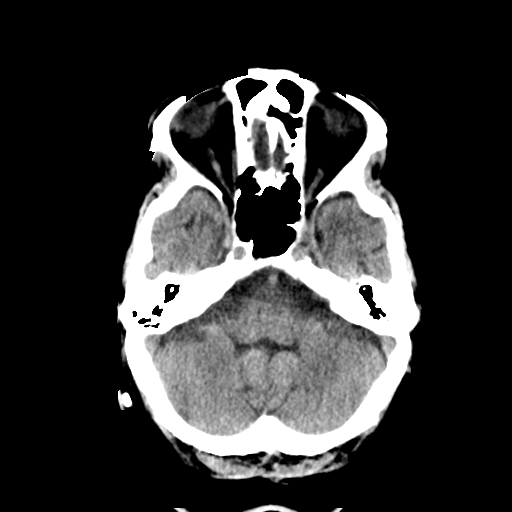
[im 9/33  brain]
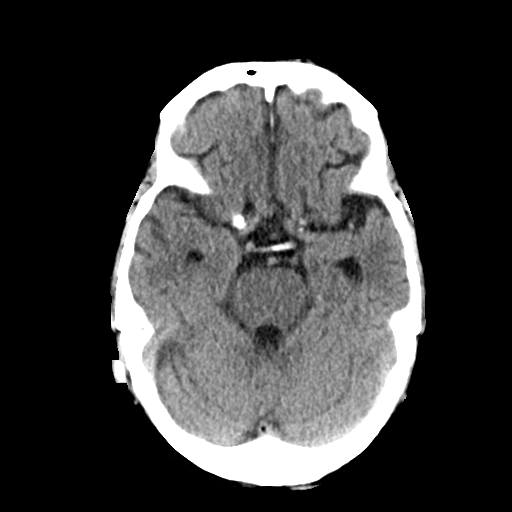
[im 13/33  brain]
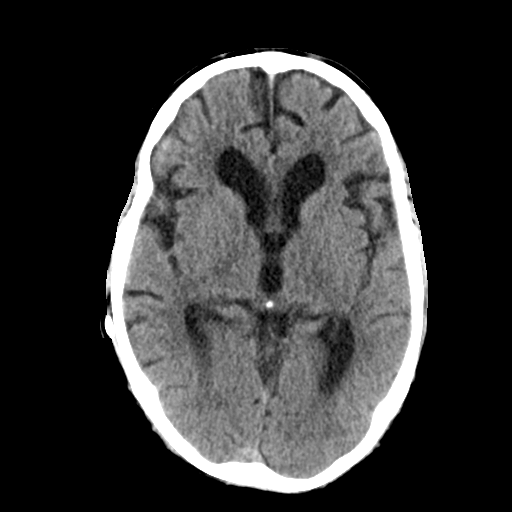
[im 17/33  brain]
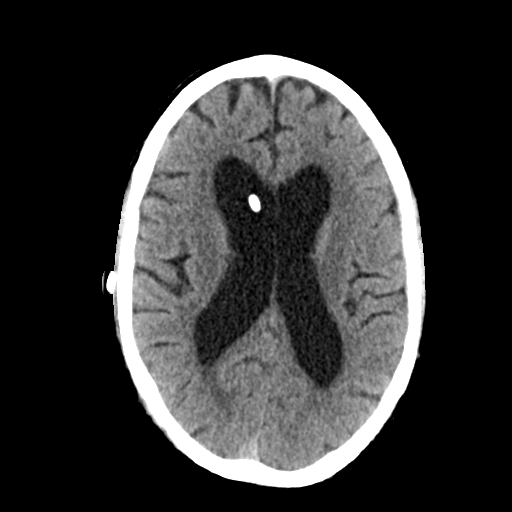
[im 17/33  bone]
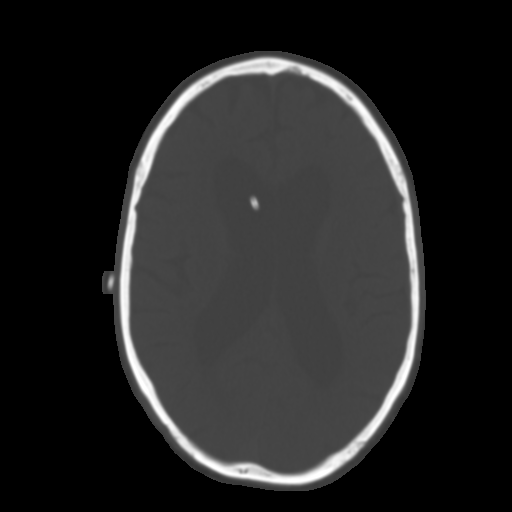
[im 20/33  brain]
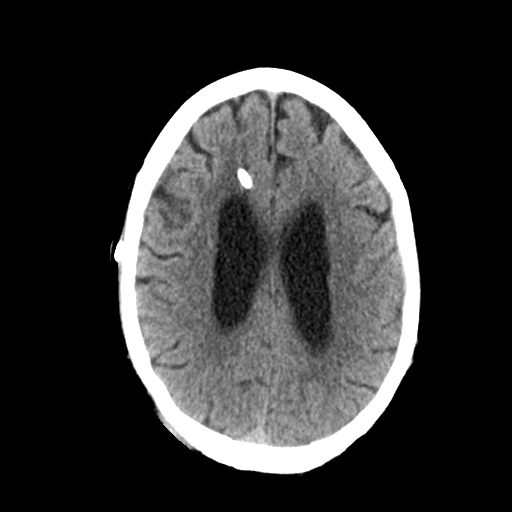
[im 24/33  brain]
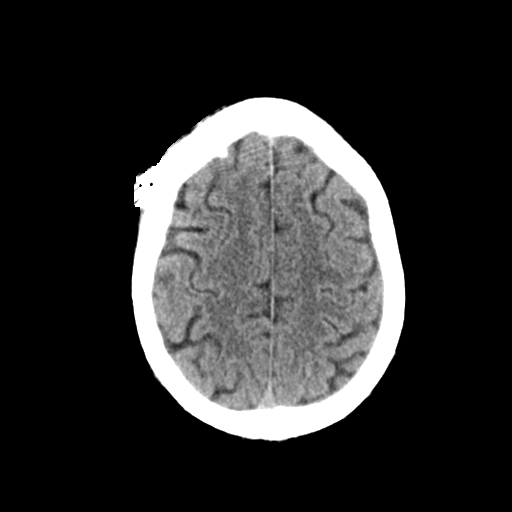
[im 27/33  brain]
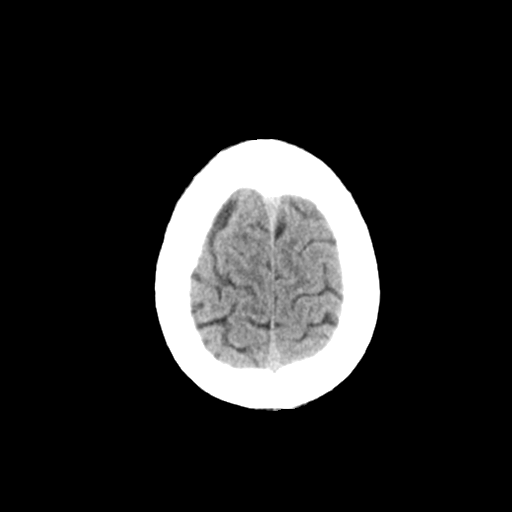
[im 30/33  brain]
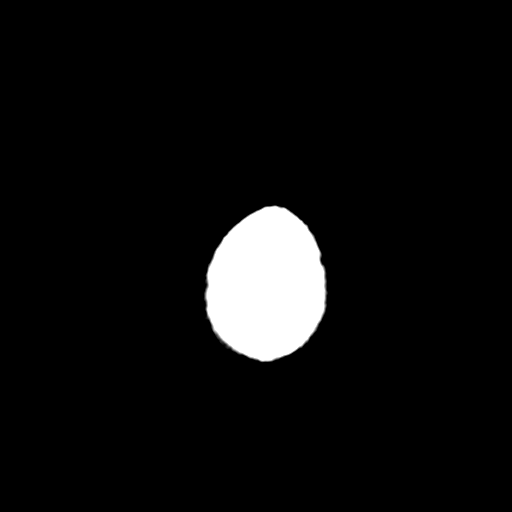
[im 30/33  bone]
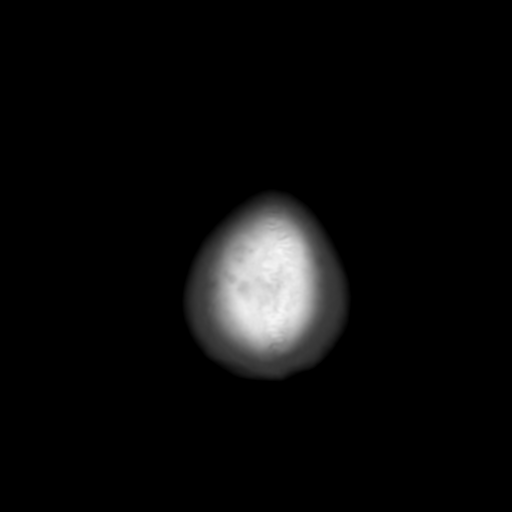

[Series 4: head wo coronal · coronal · 0.32mm/px · 3 of 71 slices shown]
[im 24/71  brain]
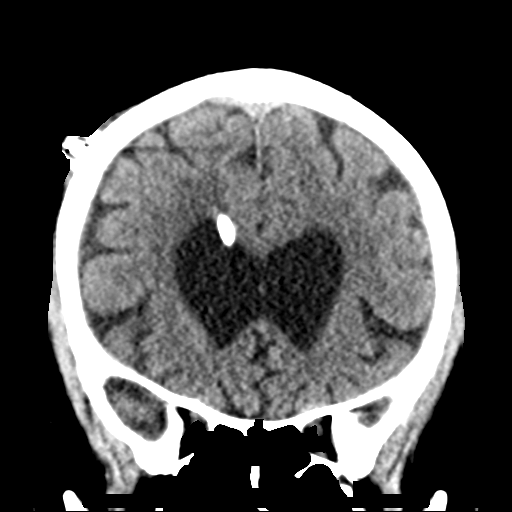
[im 32/71  brain]
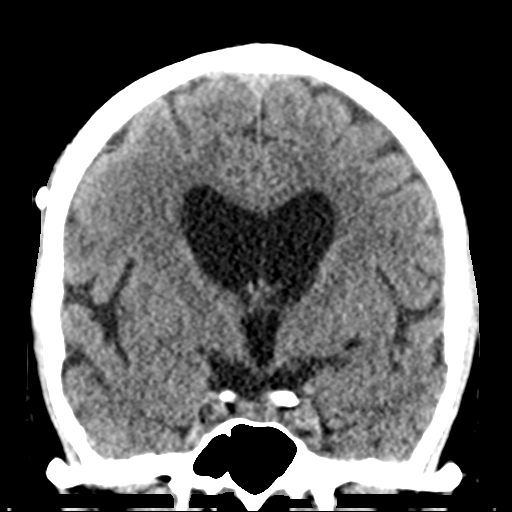
[im 39/71  brain]
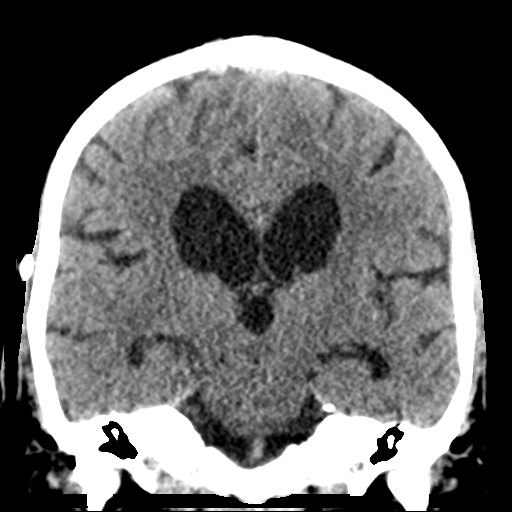

[Series 5: head wo sagittal · sagittal · 0.31mm/px · 3 of 53 slices shown]
[im 18/53  brain]
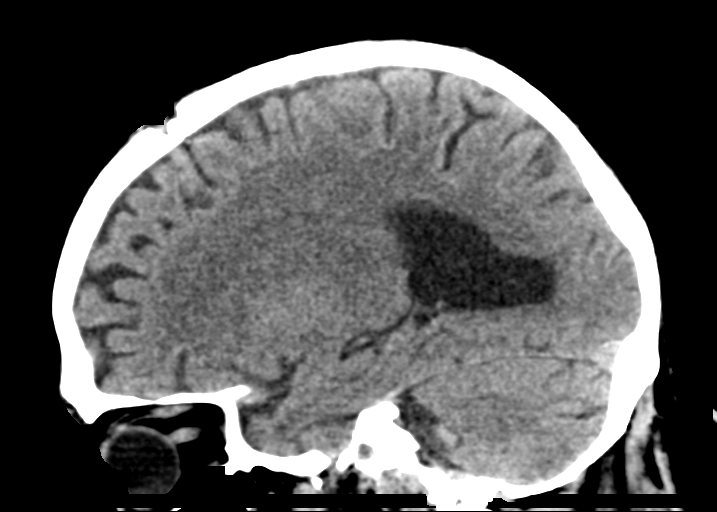
[im 27/53  brain]
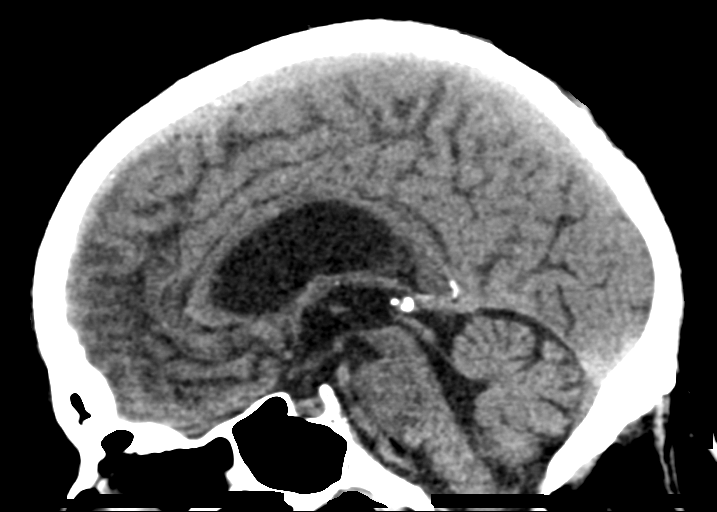
[im 35/53  brain]
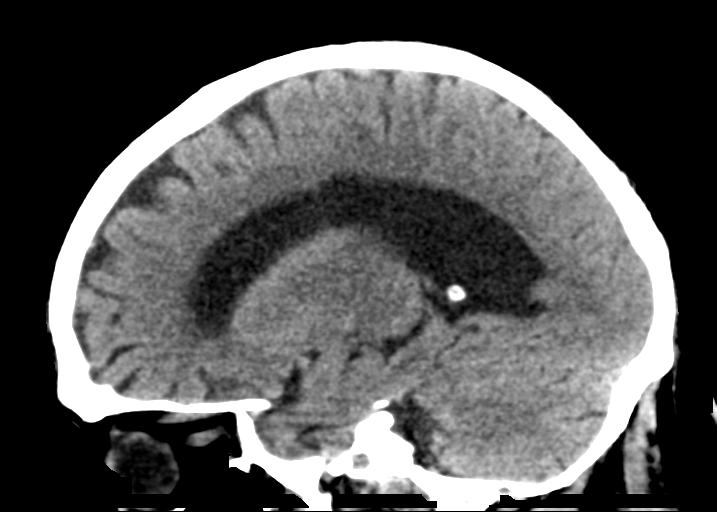

[15 of 47 positions shown; findings below may reference images not displayed]

FINDINGS: Brain: VP shunt previously placed from a right frontal approach,
entering the frontal horn of the right lateral ventricle. Resolution
of previously seen intracranial air. Ventricles are a few mm smaller
than on the previous study when measured in the same locations. No
evidence of residual or recurrent subdural hematoma. No sign of
focal brain infarction. No mass lesion.

Vascular: No abnormal vascular finding.

Skull: Negative other than the right frontal burr hole.

Sinuses/Orbits: Clear/normal

Other: None
IMPRESSION: VP shunt previously placed from a right frontal approach, entering
the frontal horn of the right lateral ventricle. Ventricles are a
few mm smaller than on the previous study when measured in the same
locations. No evidence of residual or recurrent subdural hematoma.
Resolution of intracranial air.

## 2019-01-07 ENCOUNTER — Ambulatory Visit (INDEPENDENT_AMBULATORY_CARE_PROVIDER_SITE_OTHER): Payer: Medicare HMO | Admitting: Psychology

## 2019-01-07 DIAGNOSIS — F4323 Adjustment disorder with mixed anxiety and depressed mood: Secondary | ICD-10-CM | POA: Diagnosis not present

## 2019-01-09 ENCOUNTER — Other Ambulatory Visit: Payer: Self-pay | Admitting: Neurological Surgery

## 2019-01-14 ENCOUNTER — Ambulatory Visit (INDEPENDENT_AMBULATORY_CARE_PROVIDER_SITE_OTHER): Payer: Medicare HMO | Admitting: Psychology

## 2019-01-14 DIAGNOSIS — F4323 Adjustment disorder with mixed anxiety and depressed mood: Secondary | ICD-10-CM

## 2019-01-18 DIAGNOSIS — S2249XA Multiple fractures of ribs, unspecified side, initial encounter for closed fracture: Secondary | ICD-10-CM | POA: Diagnosis not present

## 2019-01-21 DIAGNOSIS — S065X9A Traumatic subdural hemorrhage with loss of consciousness of unspecified duration, initial encounter: Secondary | ICD-10-CM | POA: Diagnosis not present

## 2019-01-26 ENCOUNTER — Ambulatory Visit (INDEPENDENT_AMBULATORY_CARE_PROVIDER_SITE_OTHER): Payer: Medicare HMO | Admitting: Psychology

## 2019-01-26 DIAGNOSIS — Z630 Problems in relationship with spouse or partner: Secondary | ICD-10-CM | POA: Diagnosis not present

## 2019-01-26 DIAGNOSIS — F4323 Adjustment disorder with mixed anxiety and depressed mood: Secondary | ICD-10-CM

## 2019-02-13 ENCOUNTER — Ambulatory Visit (INDEPENDENT_AMBULATORY_CARE_PROVIDER_SITE_OTHER): Payer: Medicare HMO | Admitting: Psychology

## 2019-02-13 DIAGNOSIS — F4323 Adjustment disorder with mixed anxiety and depressed mood: Secondary | ICD-10-CM | POA: Diagnosis not present

## 2019-02-17 DIAGNOSIS — E1129 Type 2 diabetes mellitus with other diabetic kidney complication: Secondary | ICD-10-CM | POA: Diagnosis not present

## 2019-02-17 DIAGNOSIS — I609 Nontraumatic subarachnoid hemorrhage, unspecified: Secondary | ICD-10-CM | POA: Diagnosis not present

## 2019-02-17 DIAGNOSIS — I2510 Atherosclerotic heart disease of native coronary artery without angina pectoris: Secondary | ICD-10-CM | POA: Diagnosis not present

## 2019-02-17 DIAGNOSIS — I480 Paroxysmal atrial fibrillation: Secondary | ICD-10-CM | POA: Diagnosis not present

## 2019-02-17 DIAGNOSIS — S065X9A Traumatic subdural hemorrhage with loss of consciousness of unspecified duration, initial encounter: Secondary | ICD-10-CM | POA: Diagnosis not present

## 2019-02-17 DIAGNOSIS — E1165 Type 2 diabetes mellitus with hyperglycemia: Secondary | ICD-10-CM | POA: Diagnosis not present

## 2019-02-17 DIAGNOSIS — G912 (Idiopathic) normal pressure hydrocephalus: Secondary | ICD-10-CM | POA: Diagnosis not present

## 2019-02-17 DIAGNOSIS — S2249XA Multiple fractures of ribs, unspecified side, initial encounter for closed fracture: Secondary | ICD-10-CM | POA: Diagnosis not present

## 2019-02-17 DIAGNOSIS — I10 Essential (primary) hypertension: Secondary | ICD-10-CM | POA: Diagnosis not present

## 2019-02-17 DIAGNOSIS — E782 Mixed hyperlipidemia: Secondary | ICD-10-CM | POA: Diagnosis not present

## 2019-02-18 ENCOUNTER — Other Ambulatory Visit: Payer: Self-pay

## 2019-02-18 NOTE — Progress Notes (Signed)
[facility] [address], [address] [place], [place] [address] [phone]  Date:  02/19/2019   Name:  [name] Frank.   DOB:  [birth year]   MRN:  [id number]  PCP:  Pearline Cables, MD    Chief Complaint: 4 month follow up   History of Present Illness:  [name] Seal. is a 68 y.o. 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  I have asked him to monitor this, if this worsens or if he experiences symptoms can decrease dose of metoprolol He continues to work hard with his physical therapist, and is making good progress.  Praised his efforts His wife also seems much more hopeful today We will add metformin for blood sugar control Recheck in 2 months for A1c  Needs foot exam Colon cancer screening: per pt he had a colonoscopy in the last few years, done in [place].  He will see if his wife can think of the doctor name so I can request report for him  Eye exam: he has history of bilateral catarat eye surgery Urine micro Pneumovax due; give today  Flu vaccine: done at [facility]  [name] Frank is seen by himself today - his wife did not come in due to covid 19.  [name] Frank seems to be continue to make progress.  He walked into my office much more easily than previous, he overall looks much better-not using a walker He last saw Dr Maurice Small about 6 weeks ago- they are adjusting his shunt prn.  He thinks he may need another adjustment, he feels like he is not quite as good as he was after last  adjustment He notes that he is somewhat better - dizziness is better, but not gone He notes "a sustained, low grade headache" He has some difficulty hearing as well- this seemed to start about the time the shunt was placed.  It was a somewhat abrupt hearing change, does not seem consistent with age-related hearing loss  Wt Readings from Last 3 Encounters:  02/19/19 202 lb 6.4 oz (91.8 kg)  12/18/18 203 lb (92.1 kg)  12/16/18 194 lb 8 oz (88.2 kg)   Weight was 231 a year ago, prior to his injury He is eating less than he did prior to his injury and thinks this is why he has lost weight.  He is pleased with his weight loss and wishes to lose about 15- 20 lbs more   He notes that he gets tired and cannot walk as fast as he would like.  He can't keep up with his wife when they go on a walk and is afraid he might fall and get hurt However I did remind him of how very far he has come since his bleed   He has a cane to use as needed to prevent falls  He saw cardiology just a couple of days ago- history of CAD with stent in 2016  BP Readings from Last 3 Encounters:  02/19/19 (!) 114/56  12/18/18 (!) 108/54  12/16/18 109/63   Pulse Readings from Last 3 Encounters:  02/19/19 (!) 55  12/18/18 76  12/16/18 64    Lab Results  Component Value Date   HGBA1C 9.6 (H) 11/28/2018    Patient Active Problem List   Diagnosis Date Noted  . Unspecified abnormalities of gait and mobility 12/16/2018  . 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 Normal pressure hydrocephalus (HCC) 12/02/2018  . History of subdural hematoma 11/18/2018  . Urinary incontinence 11/18/2018  . Memory loss 10/09/2018  . Right hemiparesis (HCC) 09/04/2018  . Cognitive deficit as late effect of traumatic brain injury (HCC) 09/04/2018  . Bradycardia   . Leukopenia   . Hypoalbuminemia due to protein-calorie malnutrition (HCC)   . Hydrocele   . Hydrocephalus (HCC) 07/28/2018  . Mild renal insufficiency  07/28/2018  . Paroxysmal atrial fibrillation (HCC) 07/28/2018  . Subdural hematoma (HCC) 07/14/2018  . Hypertension, essential 01/16/2018  . Controlled type 2 diabetes mellitus without complication, without long-term current use of insulin (HCC) 01/16/2018  . Coronary artery disease involving native heart without angina pectoris 01/16/2018    Past Medical History:  Diagnosis Date  . Abnormality of gait    uses walker and wheelchair  . Cognitive deficit as late effect of traumatic brain injury (HCC)   . Coronary artery disease    cardiologist-- dr s. Dickie La---  04-06-2014  NSTEMI  s/p  cardiac cath DES x1 to LAD and POBA to D1  . Dysrhythmia   . History of non-ST elevation myocardial infarction (NSTEMI)    04-06-2013  s/p  coronary stent and PCI  . Hydrocele, bilateral   . Hyperlipemia   . Hypertension   . Lower urinary tract symptoms (LUTS)   . Memory deficit   . PAF (paroxysmal atrial fibrillation) (HCC) cardilogist-- dr Dickie La   on-set 05/ 2020 during admission for [facility],  w/ RVR,  with cardizem/ toprol, back in [facility],  f/u by cardiologist , event monitor 09-16-2018 no results in epic by per pt wife was told result ok with no afib  . S/P drug eluting coronary stent placement    04-06-2014  @[facility]   DES x1 to LAD and POBA to D1  . SAH (subarachnoid hemorrhage) [facility]) neurologist-  dr [facility]--- last head CT 10-01-2018 resolving (10-23-2018  residual deficits memory issues, abnormal gait, congnitive   w/ SDH due to unwitnessed fall, traumatic head injury---- admission 07-14-2018 , d/c'd 08-01-2018,  readmitted 3 days later with late TBI effects,  d/c'd from rehab 08-22-2018  . Seizure disorder [facility])    followed by dr [facility]  . Subdural hematoma (HCC)   . Type 2 diabetes mellitus (HCC)    followed by pcp  . 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 CATARACT EXTRACTION W/ INTRAOCULAR LENS  IMPLANT, BILATERAL  2008; 2009  .  CORONARY ANGIOPLASTY WITH STENT PLACEMENT  04-06-2014  @[facility]    DES x1 to LAD and POBA to D1  . HYDROCELE EXCISION Bilateral 10/27/2018   Procedure: HYDROCELECTOMY ADULT;  Surgeon: , MD;  Location: [facility];  Service: Urology;  Laterality: Bilateral;  . TONSILLECTOMY  child  . 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   Procedure: Right ventriculoperitoneal shunt placement;  Surgeon: [facility] MD;  Location: [facility] OR;  Service: Neurosurgery;  Laterality: Right;    Social History  Tobacco Use  . Smoking status: Never Smoker  . Smokeless tobacco: Never Used  Substance Use Topics  . Alcohol use: Not Currently    Comment: VERY RARELY  . Drug use: Never    Family History  Problem Relation Age of Onset  . Stroke Father   . Hypertension Father   . Hyperlipidemia Father   . Early death Mother   . Colitis Sister   . Appendicitis Brother     No Known Allergies  Medication list has been reviewed and updated.  Current Outpatient Medications on File Prior to Visit  Medication Sig Dispense Refill  . acetaminophen (TYLENOL) 325 MG tablet Take 2 tablets (650 mg total) by mouth every 6 (six) hours as needed for mild pain.    Marland Kitchen. atorvastatin (LIPITOR) 80 MG tablet Take 1 tablet (80 mg total) by mouth daily. 30 tablet 1  . glipiZIDE (GLUCOTROL XL) 10 MG 24 hr tablet Take 1 tablet (10 mg total) by mouth 2 (two) times daily. 60 tablet 1  . levETIRAcetam (KEPPRA) 500 MG tablet Take 1 tablet (500 mg total) by mouth 2 (two) times daily. 60 tablet 11  . metFORMIN (GLUCOPHAGE) 500 MG tablet Take 1 tablet (500 mg total) by mouth 2 (two) times daily with a meal. 180 tablet 3  . metoprolol succinate (TOPROL-XL) 100 MG 24 hr tablet Take 1 tablet (100 mg total) by mouth daily. Take with or immediately following a meal. 30 tablet 5  . tamsulosin (FLOMAX) 0.4 MG CAPS capsule Take 1 capsule (0.4 mg total) by mouth daily after supper. 30 capsule 1   No current  facility-administered medications on file prior to visit.    Review of Systems:  As per HPI- otherwise negative.   Physical Examination: Vitals:   02/19/19 1013  BP: (!) 114/56  Pulse: (!) 55  Resp: 12  Temp: (!) 97.1 F (36.2 C)  SpO2: 99%   Vitals:   02/19/19 1013  Weight: 202 lb 6.4 oz (91.8 kg)  Height: 5\' 9"  (1.753 m)   Body mass index is 29.89 kg/m. Ideal Body Weight: Weight in (lb) to have BMI = 25: 168.9  GEN: WDWN, NAD, Non-toxic, A & O x 3, looks well  HEENT: Atraumatic, Normocephalic. Neck supple. No masses, No LAD. Ears and Nose: No external deformity. CV: RRR, No M/G/R. No JVD. No thrill. No extra heart sounds. PULM: CTA B, no wheezes, crackles, rhonchi. No retractions. No resp. distress. No accessory muscle use. ABD: S, NT, ND, +BS. No rebound. No HSM. EXTR: No c/c/e NEURO somewhat slow gait but continuing to make progress  PSYCH: Normally interactive. Conversant. Not depressed or anxious appearing.  Calm demeanor.    Assessment and Plan: Controlled type 2 diabetes mellitus without complication, without long-term current use of insulin (HCC) - Plan: Hemoglobin A1c, Comprehensive metabolic panel, glipiZIDE (GLUCOTROL XL) 10 MG 24 hr tablet  Hypertension, essential - Plan: Comprehensive metabolic panel, metoprolol succinate (TOPROL-XL) 50 MG 24 hr tablet  Paroxysmal atrial fibrillation (HCC)  Gait disturbance  Coronary artery disease involving native heart without angina pectoris, unspecified vessel or lesion type - Plan: Lipid panel, atorvastatin (LIPITOR) 80 MG tablet  Perceived hearing changes - Plan: Ambulatory referral to ENT  Immunization due - Plan: Pneumococcal polysaccharide vaccine 23-valent greater than or equal to 2yo subcutaneous/IM  Periodic follow-up visit today Plan check A1c for diabetes control assessment Blood pressure and pulse are low today.  Advised patient that we may need to decrease his beta-blocker, bradycardia may be  part  of why he is feeling fatigued Updated pneumonia vaccine Referral to ENT to discuss hearing loss which seem to began about 6 weeks ago with shunt placement  This visit occurred during the SARS-CoV-2 public health emergency.  Safety protocols were in place, including screening questions prior to the visit, additional usage of staff PPE, and extensive cleaning of exam room while observing appropriate contact time as indicated for disinfecting solutions.    Signed Lamar Blinks, MD  Received his labs as below  I paged his cardiologist at [facility], but did not hear back today.  I will plan to go ahead and decrease his metoprolol due to hypotension and bradycardia to 50 mg daily  Sent message to patient regarding labs  Results for orders placed or performed in visit on 02/19/19  Hemoglobin A1c  Result Value Ref Range   Hgb A1c MFr Bld 9.5 (H) 4.6 - 6.5 %  Comprehensive metabolic panel  Result Value Ref Range   Sodium 135 135 - 145 mEq/L   Potassium 4.5 3.5 - 5.1 mEq/L   Chloride 99 96 - 112 mEq/L   CO2 27 19 - 32 mEq/L   Glucose, Bld 299 (H) 70 - 99 mg/dL   BUN 18 6 - 23 mg/dL   Creatinine, Ser 0.78 0.40 - 1.50 mg/dL   Total Bilirubin 0.8 0.2 - 1.2 mg/dL   Alkaline Phosphatase 76 39 - 117 U/L   AST 11 0 - 37 U/L   ALT 21 0 - 53 U/L   Total Protein 6.4 6.0 - 8.3 g/dL   Albumin 4.1 3.5 - 5.2 g/dL   GFR 99.43 >60.00 mL/min   Calcium 9.1 8.4 - 10.5 mg/dL  Lipid panel  Result Value Ref Range   Cholesterol 171 0 - 200 mg/dL   Triglycerides 128.0 0.0 - 149.0 mg/dL   HDL 34.50 (L) >39.00 mg/dL   VLDL 25.6 0.0 - 40.0 mg/dL   LDL Cholesterol 111 (H) 0 - 99 mg/dL   Total CHOL/HDL Ratio 5    NonHDL 136.55

## 2019-02-19 ENCOUNTER — Other Ambulatory Visit: Payer: Self-pay

## 2019-02-19 ENCOUNTER — Ambulatory Visit (INDEPENDENT_AMBULATORY_CARE_PROVIDER_SITE_OTHER): Payer: Medicare HMO | Admitting: Family Medicine

## 2019-02-19 ENCOUNTER — Encounter: Payer: Self-pay | Admitting: Family Medicine

## 2019-02-19 VITALS — BP 114/56 | HR 55 | Temp 97.1°F | Resp 12 | Ht 69.0 in | Wt 202.4 lb

## 2019-02-19 DIAGNOSIS — R269 Unspecified abnormalities of gait and mobility: Secondary | ICD-10-CM | POA: Diagnosis not present

## 2019-02-19 DIAGNOSIS — Z23 Encounter for immunization: Secondary | ICD-10-CM | POA: Diagnosis not present

## 2019-02-19 DIAGNOSIS — I2510 Atherosclerotic heart disease of native coronary artery without angina pectoris: Secondary | ICD-10-CM | POA: Diagnosis not present

## 2019-02-19 DIAGNOSIS — E119 Type 2 diabetes mellitus without complications: Secondary | ICD-10-CM

## 2019-02-19 DIAGNOSIS — I480 Paroxysmal atrial fibrillation: Secondary | ICD-10-CM | POA: Diagnosis not present

## 2019-02-19 DIAGNOSIS — I10 Essential (primary) hypertension: Secondary | ICD-10-CM

## 2019-02-19 DIAGNOSIS — H9190 Unspecified hearing loss, unspecified ear: Secondary | ICD-10-CM

## 2019-02-19 LAB — LIPID PANEL
Cholesterol: 171 mg/dL (ref 0–200)
HDL: 34.5 mg/dL — ABNORMAL LOW (ref >39.00)
LDL Cholesterol: 111 mg/dL — ABNORMAL HIGH (ref 0–99)
NonHDL: 136.55
Total CHOL/HDL Ratio: 5
Triglycerides: 128 mg/dL (ref 0.0–149.0)
VLDL: 25.6 mg/dL (ref 0.0–40.0)

## 2019-02-19 LAB — COMPREHENSIVE METABOLIC PANEL
ALT: 21 U/L (ref 0–53)
AST: 11 U/L (ref 0–37)
Albumin: 4.1 g/dL (ref 3.5–5.2)
Alkaline Phosphatase: 76 U/L (ref 39–117)
BUN: 18 mg/dL (ref 6–23)
CO2: 27 mEq/L (ref 19–32)
Calcium: 9.1 mg/dL (ref 8.4–10.5)
Chloride: 99 mEq/L (ref 96–112)
Creatinine, Ser: 0.78 mg/dL (ref 0.40–1.50)
GFR: 99.43 mL/min (ref >60.00)
Glucose, Bld: 299 mg/dL — ABNORMAL HIGH (ref 70–99)
Potassium: 4.5 mEq/L (ref 3.5–5.1)
Sodium: 135 mEq/L (ref 135–145)
Total Bilirubin: 0.8 mg/dL (ref 0.2–1.2)
Total Protein: 6.4 g/dL (ref 6.0–8.3)

## 2019-02-19 LAB — HEMOGLOBIN A1C: Hgb A1c MFr Bld: 9.5 % — ABNORMAL HIGH (ref 4.6–6.5)

## 2019-02-19 MED ORDER — GLIPIZIDE ER 10 MG PO TB24: 10.0000 mg | ORAL_TABLET | Freq: Two times a day (BID) | ORAL | 3 refills | Status: DC

## 2019-02-19 MED ORDER — METOPROLOL SUCCINATE ER 50 MG PO TB24: 50.0000 mg | ORAL_TABLET | Freq: Every day | ORAL | 3 refills | Status: DC

## 2019-02-19 MED ORDER — ATORVASTATIN CALCIUM 80 MG PO TABS: 80.0000 mg | ORAL_TABLET | Freq: Every day | ORAL | 3 refills | Status: DC

## 2019-02-19 NOTE — Patient Instructions (Addendum)
Great to see you again today- I will be in touch with your labs asap You got your pneumonia vaccine today I am seeing up an ENT appt for you to check on your hearing Please schedule to see DR Ostergard asap about your shunt I am going to touch base with your cardiology team about decreasing your metoprolol- this may help with your energy level  Take care, let's plan to visit in about 6 months You can see any opthalmologic for an eye exam Please send me the name of your GI doctor if you can so I can request your colonoscopy report

## 2019-02-20 ENCOUNTER — Ambulatory Visit: Payer: Medicare HMO | Admitting: Psychology

## 2019-02-20 ENCOUNTER — Encounter: Payer: Self-pay | Admitting: Family Medicine

## 2019-02-20 DIAGNOSIS — S065X9A Traumatic subdural hemorrhage with loss of consciousness of unspecified duration, initial encounter: Secondary | ICD-10-CM | POA: Diagnosis not present

## 2019-02-26 ENCOUNTER — Other Ambulatory Visit: Payer: Self-pay | Admitting: Neurological Surgery

## 2019-02-26 DIAGNOSIS — Z7984 Long term (current) use of oral hypoglycemic drugs: Secondary | ICD-10-CM | POA: Diagnosis not present

## 2019-02-26 DIAGNOSIS — G912 (Idiopathic) normal pressure hydrocephalus: Secondary | ICD-10-CM

## 2019-02-26 DIAGNOSIS — Z961 Presence of intraocular lens: Secondary | ICD-10-CM | POA: Diagnosis not present

## 2019-02-26 DIAGNOSIS — E113293 Type 2 diabetes mellitus with mild nonproliferative diabetic retinopathy without macular edema, bilateral: Secondary | ICD-10-CM | POA: Diagnosis not present

## 2019-02-26 DIAGNOSIS — H524 Presbyopia: Secondary | ICD-10-CM | POA: Diagnosis not present

## 2019-03-02 ENCOUNTER — Other Ambulatory Visit: Payer: Self-pay

## 2019-03-02 ENCOUNTER — Ambulatory Visit
Admission: RE | Admit: 2019-03-02 | Discharge: 2019-03-02 | Disposition: A | Discharge status: Home / Self Care | Payer: Medicare HMO | Source: Ambulatory Visit | Attending: Neurological Surgery | Admitting: Neurological Surgery

## 2019-03-02 DIAGNOSIS — G912 (Idiopathic) normal pressure hydrocephalus: Secondary | ICD-10-CM

## 2019-03-02 DIAGNOSIS — R519 Headache, unspecified: Secondary | ICD-10-CM | POA: Diagnosis not present

## 2019-03-02 IMAGING — CT CT HEAD W/O CM
1 series · 15 of 30 positions shown, 19 images · non-contrast
Comparison: Brain MRI [DATE]. Head CT [DATE], and earlier.

CLINICAL DATA: 68-year-old male with persistent low-grade headaches
and dizziness. CSF shunt. Normal pressure hydrocephalus.

EXAM:
CT HEAD WITHOUT CONTRAST
TECHNIQUE: Contiguous axial images were obtained from the base of the skull
through the vertex without intravenous contrast.

[Series 2: head w/(date) · axial · 0.49mm/px · z∈[-184,-24]mm · 15 of 36 slices shown, 19 images]
[im 2/36  brain]
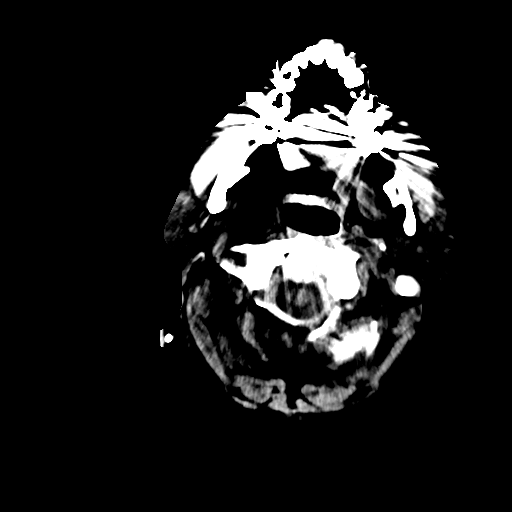
[im 2/36  bone]
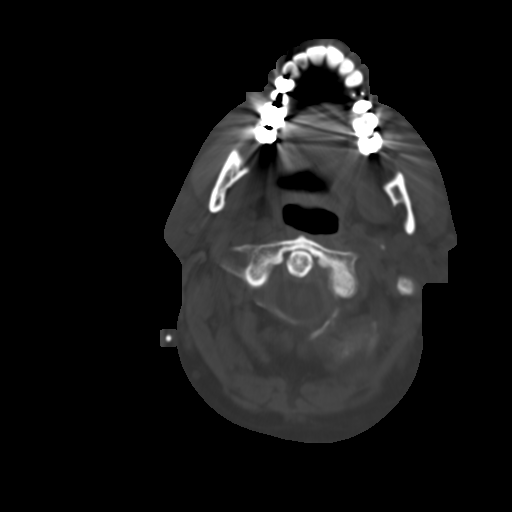
[im 4/36  brain]
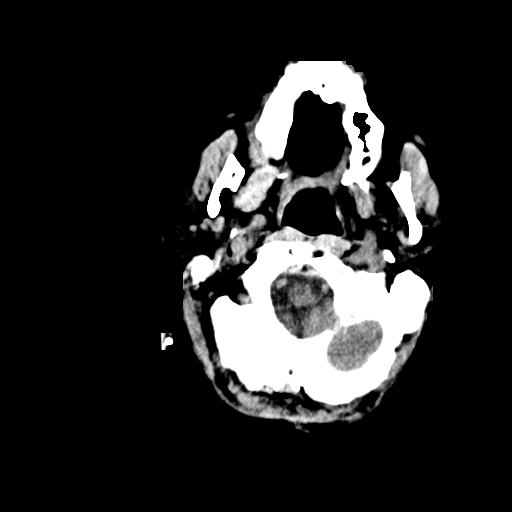
[im 7/36  brain]
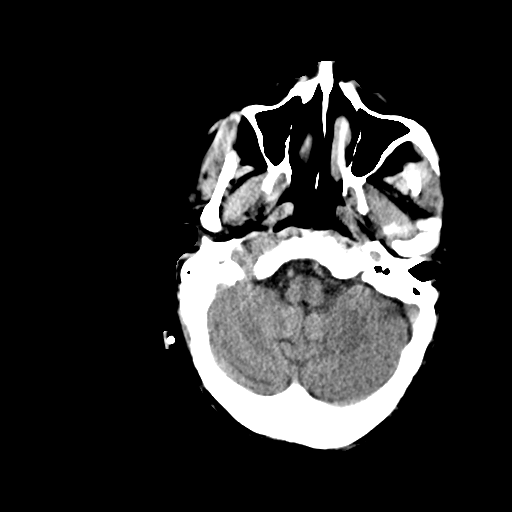
[im 9/36  brain]
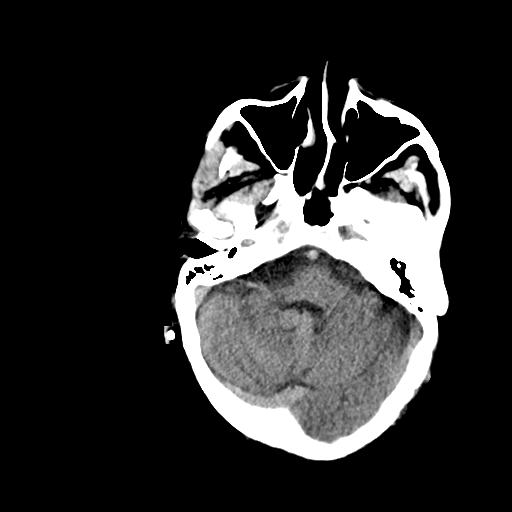
[im 11/36  brain]
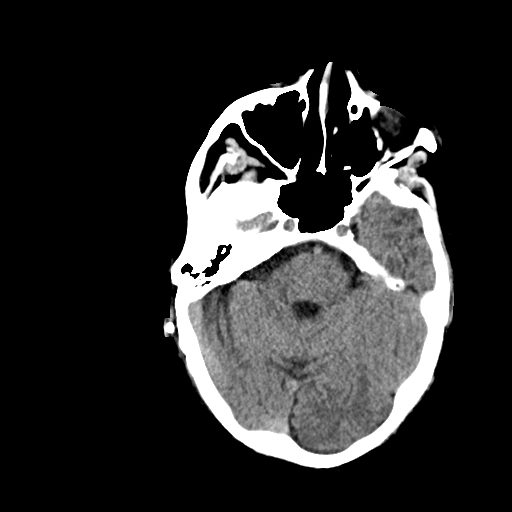
[im 11/36  bone]
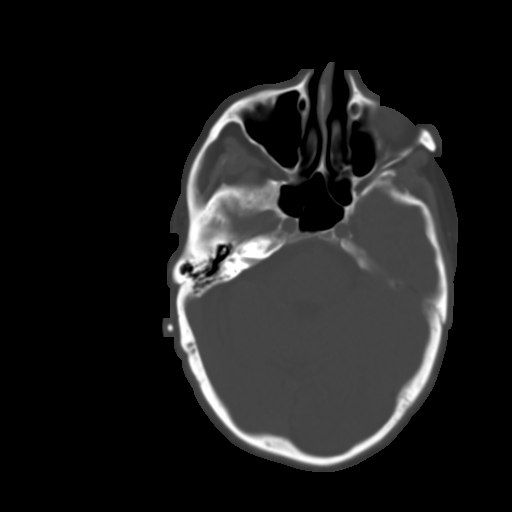
[im 14/36  brain]
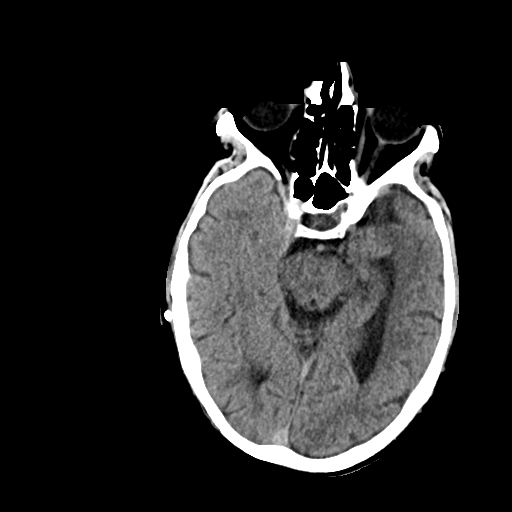
[im 16/36  brain]
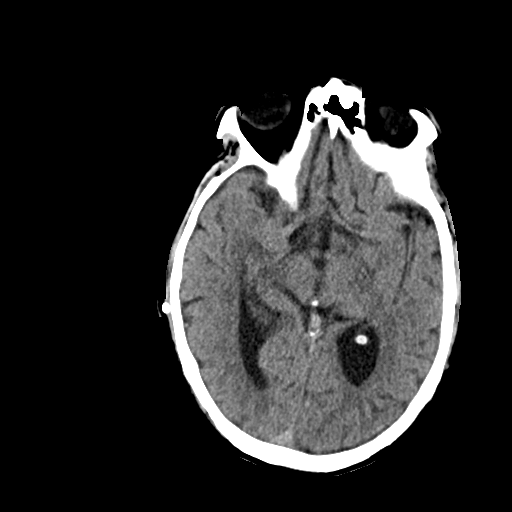
[im 19/36  brain]
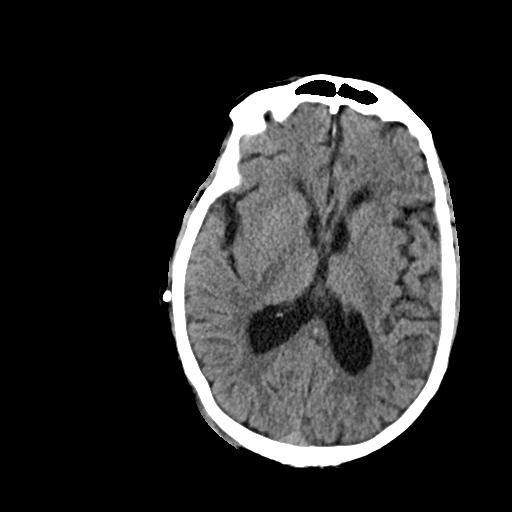
[im 20/36  brain]
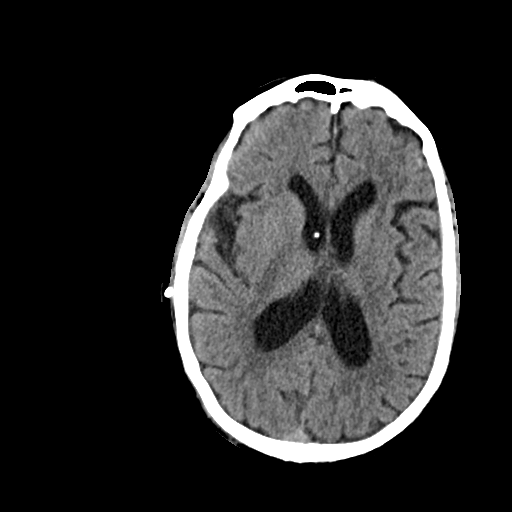
[im 20/36  bone]
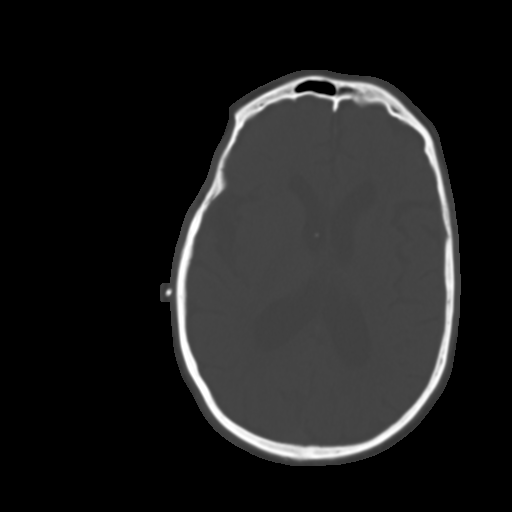
[im 22/36  brain]
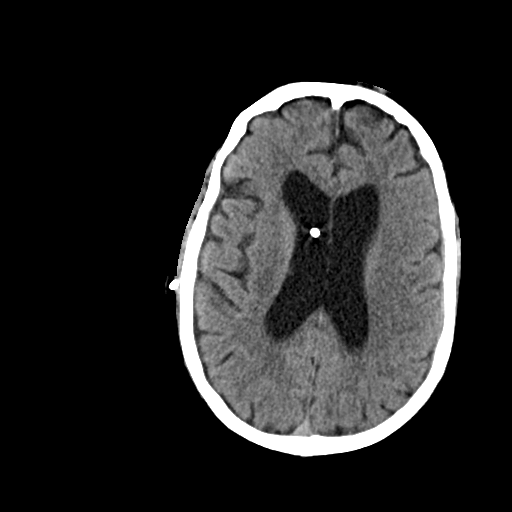
[im 25/36  brain]
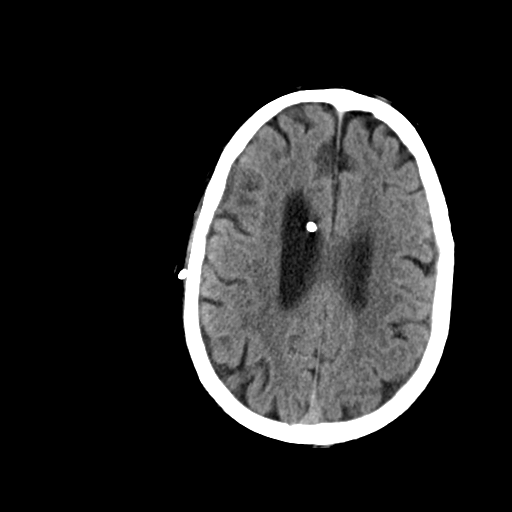
[im 27/36  brain]
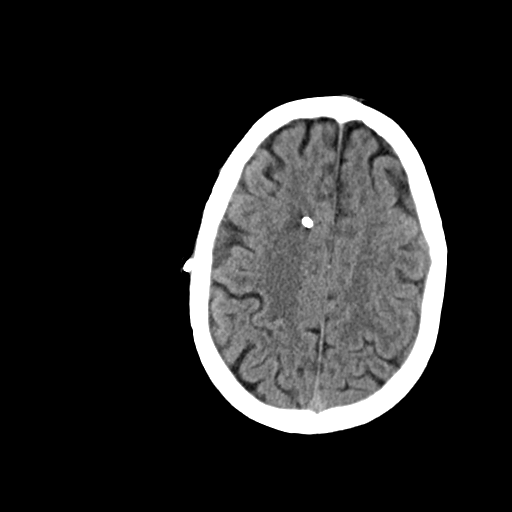
[im 29/36  brain]
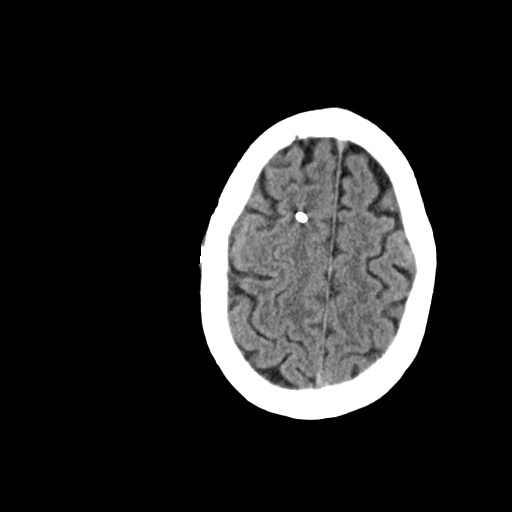
[im 29/36  bone]
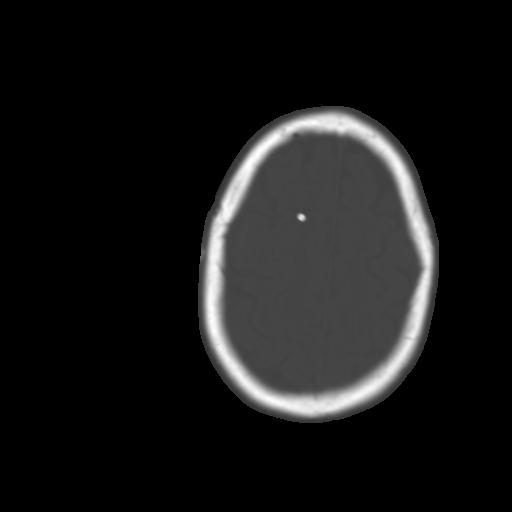
[im 32/36  brain]
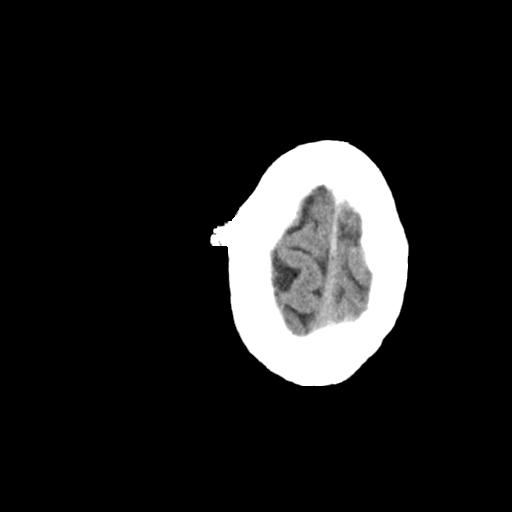
[im 34/36  brain]
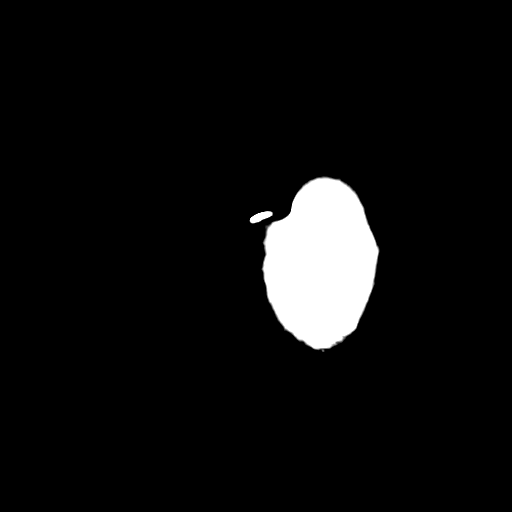

[15 of 30 positions shown; findings below may reference images not displayed]

FINDINGS: Brain: Lateral ventricle size has slightly decreased since [REDACTED],
most apparent at the temporal horns which are slightly smaller.
Third and 4th ventricle size appears stable without significant
enlargement. Stable right superior approach ventriculostomy
communicating with the right lateral ventricle (coronal image 37).

No midline shift, mass effect, evidence of mass lesion, intracranial
hemorrhage or evidence of cortically based acute infarction. No
extra-axial collection identified.

Gray-white matter differentiation is within normal limits throughout
the brain.

Vascular: Minimal atherosclerosis. No suspicious intracranial
vascular hyperdensity.

Skull: Stable. Superior right frontal bone burr hole.

Sinuses/Orbits: Visualized paranasal sinuses and mastoids are stable
and well pneumatized.

Other: Right frontal approach shunt with right superior convexity
reservoir and shunt tubing continuing laterally and posteriorly on
the right. No adverse features. No acute orbit or scalp soft tissue
findings.
IMPRESSION: 1. Slight further decrease in lateral ventricle size since [REDACTED].
Stable right superior frontal approach shunt. No adverse features.
2. No new intracranial abnormality.

## 2019-03-11 DIAGNOSIS — Z6831 Body mass index (BMI) 31.0-31.9, adult: Secondary | ICD-10-CM | POA: Diagnosis not present

## 2019-03-11 DIAGNOSIS — G912 (Idiopathic) normal pressure hydrocephalus: Secondary | ICD-10-CM | POA: Diagnosis not present

## 2019-03-17 ENCOUNTER — Ambulatory Visit: Payer: Medicare HMO | Admitting: Neurology

## 2019-03-17 ENCOUNTER — Encounter: Payer: Self-pay | Admitting: Neurology

## 2019-03-17 ENCOUNTER — Other Ambulatory Visit: Payer: Self-pay

## 2019-03-17 VITALS — BP 108/64 | HR 48 | Ht 69.0 in | Wt 214.0 lb

## 2019-03-17 DIAGNOSIS — R269 Unspecified abnormalities of gait and mobility: Secondary | ICD-10-CM | POA: Diagnosis not present

## 2019-03-17 DIAGNOSIS — G912 (Idiopathic) normal pressure hydrocephalus: Secondary | ICD-10-CM

## 2019-03-17 MED ORDER — LEVETIRACETAM 500 MG PO TABS: 500.0000 mg | ORAL_TABLET | Freq: Two times a day (BID) | ORAL | 4 refills | Status: DC

## 2019-03-17 NOTE — Progress Notes (Signed)
PATIENT: [name] Frank. DOB: [birth year]  Chief Complaint  Patient presents with  . Follow-up    Rm hall, Vaughan Basta- spouse  . Seizures    no seizures, also not taking keppra (several months ago). Just felt like he did not need to take it.      HISTORICAL  [name] Frank. is a 69 year old male, seen in request by his primary care physician Dr. Lamar Blinks for evaluation of possible seizure, history of subdural hematoma, initial evaluation was on October 09, 2018.  I have reviewed and summarized the referring note from the referring physician.  He had a past medical history of hypertension, type 2 diabetes, coronary artery disease, paroxysmal atrial fibrillation, he had an unwitnessed fall at the bottom of the stairs, was brought to the emergency room on Jul 14, 2018, I personally reviewed CT head, acute subdural hematoma along the left cerebral convexity measuring up to 11 mm in thickness, patchy subarachnoid hemorrhage with a traumatic pattern, negative for cervical spine fracture.  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  He denies gait abnormality, memory loss at that time.  He readmitted to the hospital on Jul 28, 2018 for more somnolent, slept all day on May 16 sudden onset garbled speech, right facial droop, transient confusion, emergency room blood pressure 90/53, CT head showed developing community hydrocephalus, likely due to recent subarachnoid and intraventricular hemorrhage, no surgical intervention was indicated per neurosurgery evaluation, suspicious for complex partial seizure, he was discharged with Keppra 500 mg twice a day  Since recent hospital admission, there  was no clinical seizure, but he was noted significant memory loss, he states, repeating questions, slept a lot, lost control of his urine, also has mild gait abnormality,  He is always a slow walker, he tends to wait on him, by reviewing the initial CT scan on Jul 14, 2018, there is already some evidence of hydrocephalus, enlarged bilateral lateral ventricle,  I personally reviewed MRI of the brain on Jul 29, 2018, persistent ventriculomegaly, suspected developing communicating hydrocephalus, no evidence of acute stroke, 10 mm left subdural hematoma, with no mass-effect on the hemisphere,  Laboratory evaluations showed normal ESR, C-reactive protein, ANA, HIV, RPR, B12, A1c was 7.2, hemoglobin was 12.8,   Update November 19, 2018: He continues to have significant memory loss, gait abnormality, worsening urinary incontinence, this is all new since May 2020, he retired as an Engineer, 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[employer], later worked as a Microbiologist, just finished a complicated project in April 2020, now he has difficulty operating coffee machine, difficulty initiating his gait, fell few times,  We have personally reviewed MRI CT scans, compared with multiple scans he had a since May 2020, most recent MRI brain November 15, 2018: Resolution of left subdural hematoma, ventriculomegaly out of proportion to the extent of mild generalized cortical atrophy, additionally there appears to be mild transependymal flow, 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 Zada Finders, will see him on September 9  He is tolerating Keppra 500 mg twice daily well, there was no recurrent seizure   He has tried Sinemet 25/100 mg 3 times daily, there was no significant improvement noted  UPDATE Oct 6th 2020: I reviewed  operation record by Dr. Zada Finders, he had shunt replacement December 02, 2018.  He was seen by physical therapy on December 03, 2018, the day postsurgical, patient with significant posterior lean, poor trunk control, required bilateral upper extremity support,  needs assistance, he reported overall 60% improvement in his walking, urinary frequency and urgency has much improved, still has mild memory loss  I reviewed CT head December 03, 2018: Operative changes from interval right bur hole craniotomy for VP shunt placement,  UPDATE Mar 17 2019: I personally reviewed CT head on Mar 01 2020:  Slight further decrease in lateral ventricle size since October. Stable right superior frontal approach shunt. No adverse features.  He is overall doing well, wife reported that he is back to his baseline, he is sedentary, is no longer taking Keppra 500 mg twice daily, we went over the initial event in May 2020, when he had unwitnessed fall at the ground level, but with resulting subdural hematoma, he had no recollection of the fall, possibility including seizure, after discussion, we decided to proceed with Keppra 500 mg twice a day  Lab in Dec 2020: LDL 111, CMP, glucose 299, creat 0.78, A1C 9.5  REVIEW OF SYSTEMS: Full 14 system review of systems performed and notable only for as above All other review of systems were negative.  ALLERGIES: No Known Allergies  HOME MEDICATIONS: Current Outpatient Medications  Medication Sig Dispense Refill  . acetaminophen (TYLENOL) 325 MG tablet Take 2 tablets (650 mg total) by mouth every 6 (six) hours as needed for mild pain.    Marland Kitchen atorvastatin (LIPITOR) 80 MG tablet Take 1 tablet (80 mg total) by mouth daily. 90 tablet 3  . glipiZIDE (GLUCOTROL XL) 10 MG 24 hr tablet Take 1 tablet (10 mg total) by mouth 2 (two) times daily. 180 tablet 3  . metFORMIN (GLUCOPHAGE) 500 MG tablet Take 1 tablet (500 mg total) by mouth 2 (two) times daily with a meal. 180 tablet 3  .  metoprolol succinate (TOPROL-XL) 50 MG 24 hr tablet Take 1 tablet (50 mg total) by mouth daily. Take with or immediately following a meal. 90 tablet 3  . tamsulosin (FLOMAX) 0.4 MG CAPS capsule Take 1 capsule (0.4 mg total) by mouth daily after supper. 30 capsule 1  . levETIRAcetam (KEPPRA) 500 MG tablet Take 1 tablet (500 mg total) by mouth 2 (two) times daily. (Patient not taking: Reported on 03/17/2019) 60 tablet 11   No current facility-administered medications for this visit.    PAST MEDICAL HISTORY: Past Medical History:  Diagnosis Date  . Abnormality of gait    uses walker and wheelchair  . Cognitive deficit as late effect of traumatic brain injury ([place])   . Coronary artery disease    cardiologist-- dr s. Jake Bathe---  04-06-2014  NSTEMI  s/p  cardiac cath DES x1 to LAD and POBA to D1  . Dysrhythmia   . History of non-ST elevation myocardial infarction (NSTEMI)    04-06-2013  s/p  coronary stent and PCI  . Hydrocele, bilateral   . Hyperlipemia   . Hypertension   . Lower urinary tract symptoms (LUTS)   . Memory deficit   . PAF (paroxysmal atrial fibrillation) ([place]) cardilogist-- dr Jake Bathe   on-set 05/ 2020 during admission for [facility],  w/ RVR,  with cardizem/ toprol, back in NSR,  f/u by cardiologist , event monitor 09-16-2018 no results in epic by per pt wife was told result ok with no afib  . S/P drug eluting coronary stent placement    04-06-2014  _0   DES x1 to LAD and POBA to D1  . SAH (subarachnoid hemorrhage) [facility]) neurologist-  dr Krista Blue--- last head CT 10-01-2018 resolving (10-23-2018  residual deficits memory issues, abnormal gait, congnitive   w/ SDH due to unwitnessed fall, traumatic head injury---- admission 07-14-2018 , d/c'd 08-01-2018,  readmitted 3 days later with late TBI effects,  d/c'd from rehab 08-22-2018  . Seizure disorder [facility])    followed by dr Krista Blue  . Subdural hematoma ([place])   . Type 2 diabetes mellitus ([place])    followed by pcp  . 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 CATARACT EXTRACTION W/ INTRAOCULAR LENS  IMPLANT, BILATERAL  2008; 2009  . CORONARY ANGIOPLASTY WITH STENT PLACEMENT  04-06-2014  _1    DES x1 to LAD and POBA to D1  . HYDROCELE EXCISION Bilateral 10/27/2018   Procedure: HYDROCELECTOMY ADULT;  Surgeon: [name], MD;  [facility];  Service: Urology;  Laterality: Bilateral;  . TONSILLECTOMY  child  . 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   Procedure: Right ventriculoperitoneal shunt placement;  Surgeon: Judith Part, MD;  Location: [place];  Service: Neurosurgery;  Laterality: Right;    FAMILY HISTORY: Family History  Problem Relation Age of Onset  . Stroke Father   . Hypertension Father   . Hyperlipidemia Father   . Early death Mother   . Colitis Sister   . Appendicitis Brother     SOCIAL HISTORY: Social History   Socioeconomic History  . Marital status: Married    Spouse name: Not on file  . Number of children: 2  . Years of education: college  . Highest education level: Master's degree (e.g., MA, MS, MEng, MEd, MSW, MBA)  Occupational History  . Occupation: Retired  Tobacco Use  . Smoking status: Never Smoker  . Smokeless tobacco: Never Used  Substance and Sexual Activity  . Alcohol use: Not Currently    Comment: VERY RARELY  . Drug use: Never  . Sexual activity: Not on file  Other Topics Concern  . Not on file  Social History Narrative   Lives at home with his wife.   Right-handed.   Limited caffeine use.   Social Determinants of Health   Financial Resource Strain:   . Difficulty of Paying Living Expenses: Not on file  Food Insecurity:   . Worried About Charity fundraiser in the Last Year: Not on file  . Ran Out of Food in the Last Year: Not on file  Transportation Needs:   . Lack of Transportation (Medical): Not on file  . Lack of Transportation (Non-Medical):  Not on file  Physical Activity:   . Days of Exercise per Week: Not on file  . Minutes of Exercise per Session: Not on file  Stress:   . Feeling of Stress : Not on file  Social Connections:   . Frequency of Communication with Friends and Family: Not on file  . Frequency of Social Gatherings with Friends and Family: Not on file  . Attends Religious Services: Not on file  . Active Member of Clubs or Organizations: Not on file  . Attends Club  or Organization Meetings: Not on file  . Marital Status: Not on file  Intimate Partner Violence:   . Fear of Current or Ex-Partner: Not on file  . Emotionally Abused: Not on file  . Physically Abused: Not on file  . Sexually Abused: Not on file     PHYSICAL EXAM   Vitals:   03/17/19 1045  BP: 108/64  Pulse: (!) 48  Weight: 214 lb (97.1 kg)  Height: _0  (1.753 m)    Not recorded      Body mass index is 31.6 kg/m.  PHYSICAL EXAMNIATION:  Gen: NAD, conversant, well nourised,well groomed                     Cardiovascular: Regular rate rhythm, no peripheral edema, warm, nontender. Eyes: Conjunctivae clear without exudates or hemorrhage Neck: Supple, no carotid bruits. Pulmonary: Clear to auscultation bilaterally   NEUROLOGICAL EXAM:  MENTAL STATUS:  Speech/speech: Rely on his wife to provide most history, alert, following command CRANIAL NERVES: CN II: Visual fields are full to confrontation.  Pupils are round equal and briskly reactive to light. CN III, IV, VI: extraocular movement are normal. No ptosis. CN V: Facial sensation is intact to pinprick in all 3 divisions bilaterally. Corneal responses are intact.  CN VII: Face is symmetric with normal eye closure and smile. CN VIII: Hearing is normal to casual conversation CN IX, X: Palate elevates symmetrically. Phonation is normal. CN XI: Head turning and shoulder shrug are intact CN XII: Tongue is midline with normal movements and no atrophy.  MOTOR: No significant  weakness, right more than left rigidity, mild bradykinesia  REFLEXES: Reflexes are 1 and symmetric at the biceps, triceps, knees, and ankles. Plantar responses are flexor.  SENSORY: Intact to light touch,  COORDINATION: Rapid alternating movements and fine finger movements are intact. There is no dysmetria on finger-to-nose and heel-knee-shin.    GAIT/STANCE: Need push-up to get up from seated position, difficulty initiate gait, there was no longer have significant magnetic gait, unsteady, wide-based, leaning forward  DIAGNOSTIC DATA (LABS, IMAGING, TESTING) - I reviewed patient records, labs, notes, testing and imaging myself where available.   ASSESSMENT AND PLAN  [name] Retz. is a 69 y.o. male   History of fall of unknown reason on Jul 14, 2018, with left convexity subdural hematoma, subarachnoid hemorrhage Evidence of normal pressure hydrocephalus, Status post VP shunt placement on December 02, 2018 Complex partial seizure, most recent on was in May 2020  Continue Keppra 500 mg twice a day  Return to clinic in one year prn  Marcial Pacas, M.D. Ph.D.  [facility] [address], [place], [place] [address] Ph: [phone] Fax: [phone]  CC: Copland, Gay Filler, MD

## 2019-03-18 DIAGNOSIS — H903 Sensorineural hearing loss, bilateral: Secondary | ICD-10-CM | POA: Diagnosis not present

## 2019-03-18 DIAGNOSIS — H6983 Other specified disorders of Eustachian tube, bilateral: Secondary | ICD-10-CM | POA: Diagnosis not present

## 2019-03-18 DIAGNOSIS — H906 Mixed conductive and sensorineural hearing loss, bilateral: Secondary | ICD-10-CM | POA: Diagnosis not present

## 2019-03-20 DIAGNOSIS — S2249XA Multiple fractures of ribs, unspecified side, initial encounter for closed fracture: Secondary | ICD-10-CM | POA: Diagnosis not present

## 2019-03-23 DIAGNOSIS — S065X9A Traumatic subdural hemorrhage with loss of consciousness of unspecified duration, initial encounter: Secondary | ICD-10-CM | POA: Diagnosis not present

## 2019-04-20 DIAGNOSIS — S2249XA Multiple fractures of ribs, unspecified side, initial encounter for closed fracture: Secondary | ICD-10-CM | POA: Diagnosis not present

## 2019-04-23 DIAGNOSIS — S065X9A Traumatic subdural hemorrhage with loss of consciousness of unspecified duration, initial encounter: Secondary | ICD-10-CM | POA: Diagnosis not present

## 2019-05-04 ENCOUNTER — Ambulatory Visit (INDEPENDENT_AMBULATORY_CARE_PROVIDER_SITE_OTHER): Payer: Medicare HMO | Admitting: Psychology

## 2019-05-04 DIAGNOSIS — F4323 Adjustment disorder with mixed anxiety and depressed mood: Secondary | ICD-10-CM

## 2019-05-07 ENCOUNTER — Ambulatory Visit: Payer: Medicare HMO | Attending: Internal Medicine

## 2019-05-07 DIAGNOSIS — Z23 Encounter for immunization: Secondary | ICD-10-CM | POA: Insufficient documentation

## 2019-05-07 NOTE — Progress Notes (Signed)
   [id number]  Name:  [name] Frank.    MRN: [id number] DOB: [birth year]  05/07/2019  [name] Frank was observed post Covid-19 immunization for 15 minutes without incidence. He was provided with Vaccine Information Sheet and instruction to access the V-Safe system.   [name] Frank was instructed to call 911 with any severe reactions post vaccine: Marland Kitchen Difficulty breathing  . Swelling of your face and throat  . A fast heartbeat  . A bad rash all over your body  . Dizziness and weakness    Immunizations Administered    Name Date Dose VIS Date Route   Pfizer COVID-19 Vaccine 05/07/2019 10:44 AM 0.3 mL 02/20/2019 Intramuscular   Manufacturer: [employer], [employer]   Lot: [id number]   NDC: [address]-4

## 2019-05-18 DIAGNOSIS — S2249XA Multiple fractures of ribs, unspecified side, initial encounter for closed fracture: Secondary | ICD-10-CM | POA: Diagnosis not present

## 2019-05-21 DIAGNOSIS — S065X9A Traumatic subdural hemorrhage with loss of consciousness of unspecified duration, initial encounter: Secondary | ICD-10-CM | POA: Diagnosis not present

## 2019-06-02 ENCOUNTER — Ambulatory Visit: Payer: Medicare HMO | Attending: Internal Medicine

## 2019-06-02 DIAGNOSIS — Z23 Encounter for immunization: Secondary | ICD-10-CM

## 2019-06-02 NOTE — Progress Notes (Signed)
   [id number]  Name:  Lem Peary.    MRN: [id number] DOB: [birth year]  06/02/2019  Mr. Lamba was observed post Covid-19 immunization for 15 minutes without incident. He was provided with Vaccine Information Sheet and instruction to access the V-Safe system.   Mr. Riendeau was instructed to call 911 with any severe reactions post vaccine: Marland Kitchen Difficulty breathing  . Swelling of face and throat  . A fast heartbeat  . A bad rash all over body  . Dizziness and weakness   Immunizations Administered    Name Date Dose VIS Date Route   Pfizer COVID-19 Vaccine 06/02/2019 10:49 AM 0.3 mL 02/20/2019 Intramuscular   Manufacturer: [employer], [employer]   Lot: [id number]   NDC: [address]-0

## 2019-06-18 DIAGNOSIS — S2249XA Multiple fractures of ribs, unspecified side, initial encounter for closed fracture: Secondary | ICD-10-CM | POA: Diagnosis not present

## 2019-06-21 DIAGNOSIS — S065X9A Traumatic subdural hemorrhage with loss of consciousness of unspecified duration, initial encounter: Secondary | ICD-10-CM | POA: Diagnosis not present

## 2019-07-18 DIAGNOSIS — S2249XA Multiple fractures of ribs, unspecified side, initial encounter for closed fracture: Secondary | ICD-10-CM | POA: Diagnosis not present

## 2019-07-21 DIAGNOSIS — S065X9A Traumatic subdural hemorrhage with loss of consciousness of unspecified duration, initial encounter: Secondary | ICD-10-CM | POA: Diagnosis not present

## 2019-08-11 ENCOUNTER — Other Ambulatory Visit: Payer: Self-pay

## 2019-08-11 ENCOUNTER — Emergency Department (HOSPITAL_COMMUNITY): Payer: Medicare HMO

## 2019-08-11 ENCOUNTER — Encounter (HOSPITAL_COMMUNITY): Payer: Self-pay | Admitting: Emergency Medicine

## 2019-08-11 ENCOUNTER — Emergency Department (HOSPITAL_COMMUNITY)
Admission: EM | Admit: 2019-08-11 | Discharge: 2019-08-12 | Disposition: A | Discharge status: Home / Self Care | Payer: Medicare HMO | Source: Home / Self Care | Attending: Emergency Medicine | Admitting: Emergency Medicine

## 2019-08-11 DIAGNOSIS — Z20822 Contact with and (suspected) exposure to covid-19: Secondary | ICD-10-CM | POA: Insufficient documentation

## 2019-08-11 DIAGNOSIS — E119 Type 2 diabetes mellitus without complications: Secondary | ICD-10-CM | POA: Insufficient documentation

## 2019-08-11 DIAGNOSIS — Z7984 Long term (current) use of oral hypoglycemic drugs: Secondary | ICD-10-CM | POA: Insufficient documentation

## 2019-08-11 DIAGNOSIS — R079 Chest pain, unspecified: Secondary | ICD-10-CM | POA: Diagnosis not present

## 2019-08-11 DIAGNOSIS — R29810 Facial weakness: Secondary | ICD-10-CM | POA: Insufficient documentation

## 2019-08-11 DIAGNOSIS — I252 Old myocardial infarction: Secondary | ICD-10-CM | POA: Insufficient documentation

## 2019-08-11 DIAGNOSIS — I959 Hypotension, unspecified: Secondary | ICD-10-CM | POA: Diagnosis not present

## 2019-08-11 DIAGNOSIS — G40201 Localization-related (focal) (partial) symptomatic epilepsy and epileptic syndromes with complex partial seizures, not intractable, with status epilepticus: Secondary | ICD-10-CM | POA: Diagnosis not present

## 2019-08-11 DIAGNOSIS — R4182 Altered mental status, unspecified: Secondary | ICD-10-CM | POA: Diagnosis not present

## 2019-08-11 DIAGNOSIS — G4489 Other headache syndrome: Secondary | ICD-10-CM | POA: Diagnosis not present

## 2019-08-11 DIAGNOSIS — E1165 Type 2 diabetes mellitus with hyperglycemia: Secondary | ICD-10-CM | POA: Diagnosis not present

## 2019-08-11 DIAGNOSIS — I10 Essential (primary) hypertension: Secondary | ICD-10-CM | POA: Insufficient documentation

## 2019-08-11 DIAGNOSIS — R000 Tachycardia, unspecified: Secondary | ICD-10-CM | POA: Diagnosis not present

## 2019-08-11 DIAGNOSIS — R4789 Other speech disturbances: Secondary | ICD-10-CM

## 2019-08-11 DIAGNOSIS — I480 Paroxysmal atrial fibrillation: Secondary | ICD-10-CM | POA: Insufficient documentation

## 2019-08-11 DIAGNOSIS — Z03818 Encounter for observation for suspected exposure to other biological agents ruled out: Secondary | ICD-10-CM | POA: Diagnosis not present

## 2019-08-11 DIAGNOSIS — R404 Transient alteration of awareness: Secondary | ICD-10-CM | POA: Diagnosis not present

## 2019-08-11 DIAGNOSIS — I6622 Occlusion and stenosis of left posterior cerebral artery: Secondary | ICD-10-CM | POA: Diagnosis not present

## 2019-08-11 DIAGNOSIS — I2510 Atherosclerotic heart disease of native coronary artery without angina pectoris: Secondary | ICD-10-CM | POA: Insufficient documentation

## 2019-08-11 DIAGNOSIS — Z79899 Other long term (current) drug therapy: Secondary | ICD-10-CM | POA: Insufficient documentation

## 2019-08-11 DIAGNOSIS — R410 Disorientation, unspecified: Secondary | ICD-10-CM | POA: Insufficient documentation

## 2019-08-11 LAB — COMPREHENSIVE METABOLIC PANEL
ALT: 26 U/L (ref 0–44)
AST: 19 U/L (ref 15–41)
Albumin: 3.7 g/dL (ref 3.5–5.0)
Alkaline Phosphatase: 59 U/L (ref 38–126)
Anion gap: 11 (ref 5–15)
BUN: 18 mg/dL (ref 8–23)
CO2: 22 mmol/L (ref 22–32)
Calcium: 9 mg/dL (ref 8.9–10.3)
Chloride: 102 mmol/L (ref 98–111)
Creatinine, Ser: 0.85 mg/dL (ref 0.61–1.24)
GFR calc Af Amer: >60 mL/min (ref >60)
GFR calc non Af Amer: >60 mL/min (ref >60)
Glucose, Bld: 302 mg/dL — ABNORMAL HIGH (ref 70–99)
Potassium: 4.4 mmol/L (ref 3.5–5.1)
Sodium: 135 mmol/L (ref 135–145)
Total Bilirubin: 0.8 mg/dL (ref 0.3–1.2)
Total Protein: 6.3 g/dL — ABNORMAL LOW (ref 6.5–8.1)

## 2019-08-11 LAB — DIFFERENTIAL
Abs Immature Granulocytes: 0.01 10*3/uL (ref 0.00–0.07)
Basophils Absolute: 0 10*3/uL (ref 0.0–0.1)
Basophils Relative: 1 %
Eosinophils Absolute: 0.1 10*3/uL (ref 0.0–0.5)
Eosinophils Relative: 1 %
Immature Granulocytes: 0 %
Lymphocytes Relative: 26 %
Lymphs Abs: 1.3 10*3/uL (ref 0.7–4.0)
Monocytes Absolute: 0.4 10*3/uL (ref 0.1–1.0)
Monocytes Relative: 8 %
Neutro Abs: 3.1 10*3/uL (ref 1.7–7.7)
Neutrophils Relative %: 64 %

## 2019-08-11 LAB — URINALYSIS, ROUTINE W REFLEX MICROSCOPIC
Bacteria, UA: NONE SEEN
Bilirubin Urine: NEGATIVE
Glucose, UA: >=500 mg/dL — AB
Hgb urine dipstick: NEGATIVE
Ketones, ur: NEGATIVE mg/dL
Leukocytes,Ua: NEGATIVE
Nitrite: NEGATIVE
Protein, ur: NEGATIVE mg/dL
Specific Gravity, Urine: >1.046 — ABNORMAL HIGH (ref 1.005–1.030)
pH: 5 (ref 5.0–8.0)

## 2019-08-11 LAB — CBC
HCT: 46.5 % (ref 39.0–52.0)
Hemoglobin: 15.7 g/dL (ref 13.0–17.0)
MCH: 29.5 pg (ref 26.0–34.0)
MCHC: 33.8 g/dL (ref 30.0–36.0)
MCV: 87.2 fL (ref 80.0–100.0)
Platelets: 146 10*3/uL — ABNORMAL LOW (ref 150–400)
RBC: 5.33 MIL/uL (ref 4.22–5.81)
RDW: 13.1 % (ref 11.5–15.5)
WBC: 4.9 10*3/uL (ref 4.0–10.5)
nRBC: 0 % (ref 0.0–0.2)

## 2019-08-11 LAB — PROTIME-INR
INR: 1 (ref 0.8–1.2)
Prothrombin Time: 12.8 seconds (ref 11.4–15.2)

## 2019-08-11 LAB — APTT: aPTT: 34 seconds (ref 24–36)

## 2019-08-11 LAB — SARS CORONAVIRUS 2 BY RT PCR (HOSPITAL ORDER, PERFORMED IN ~~LOC~~ HOSPITAL LAB): SARS Coronavirus 2: NEGATIVE

## 2019-08-11 IMAGING — CT CT ANGIO NECK
1 of 11 series · 11 of 46 positions shown, 15 images · IV contrast (OMNI)
Comparison: Noncontrast head CT [DATE], brain MRI [DATE],
CT angiogram head/neck [DATE]

CLINICAL DATA: Altered mental status, unclear cause. Additional
history provided: Expressive aphasia. NIH 1.

EXAM:
CT ANGIOGRAPHY HEAD AND NECK
TECHNIQUE: Multidetector CT imaging of the head and neck was performed using
the standard protocol during bolus administration of intravenous
contrast. Multiplanar CT image reconstructions and MIPs were
obtained to evaluate the vascular anatomy. Carotid stenosis
measurements (when applicable) are obtained utilizing NASCET
criteria, using the distal internal carotid diameter as the
denominator.
CONTRAST:  75mL OMNIPAQUE IOHEXOL 350 MG/ML SOLN

[Series 16: thin (person_name) · axial · 0.61mm/px · z∈[+866,+1205]mm · 11 of 777 slices shown, 15 images]
[im 49/777  soft-tissue]
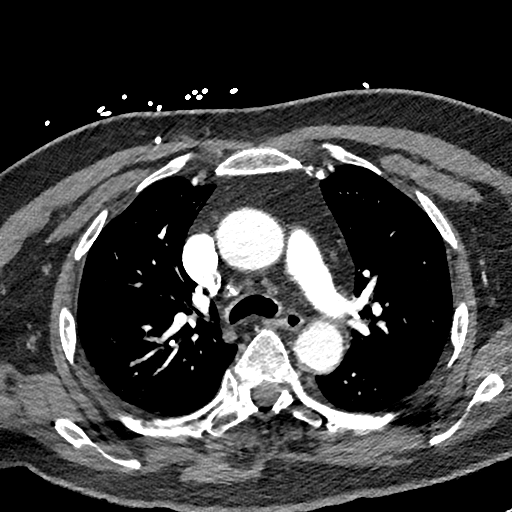
[im 49/777  bone]
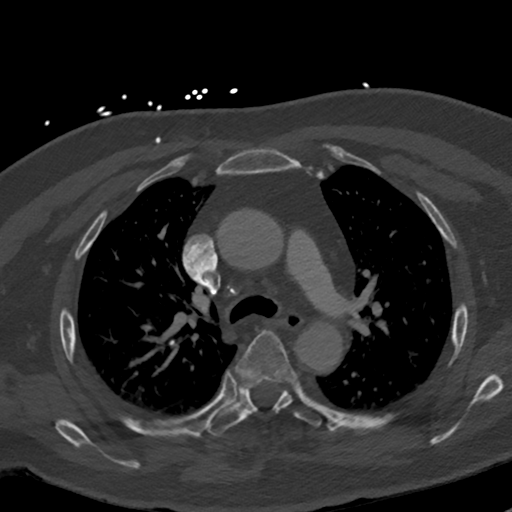
[im 146/777  soft-tissue]
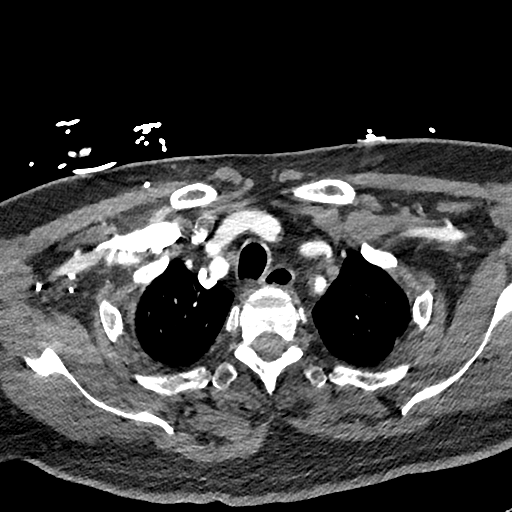
[im 243/777  soft-tissue]
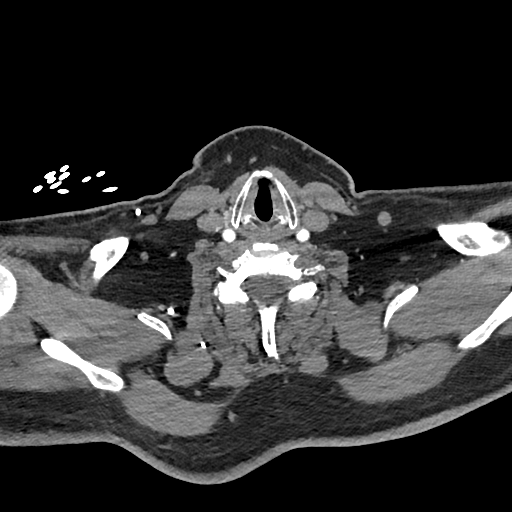
[im 292/777  soft-tissue]
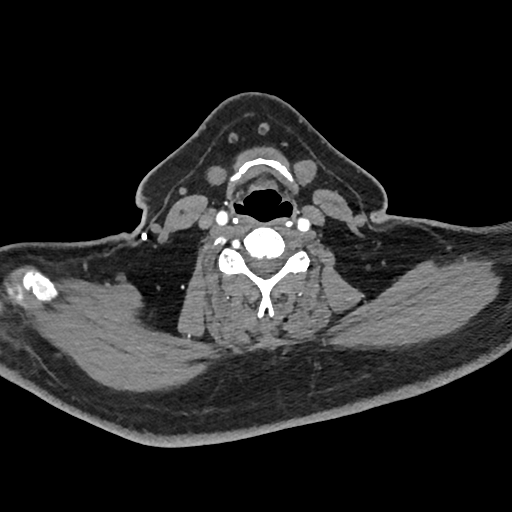
[im 389/777  soft-tissue]
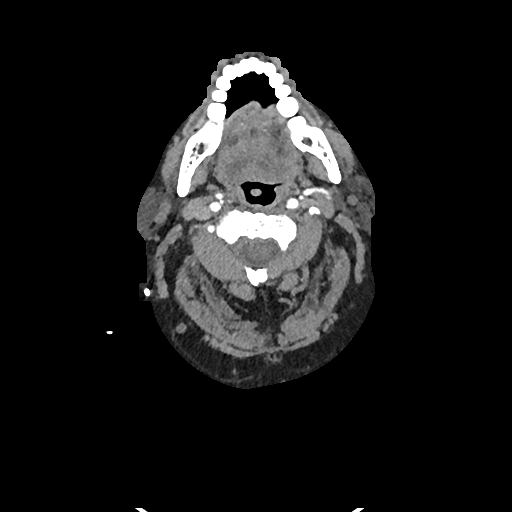
[im 486/777  soft-tissue]
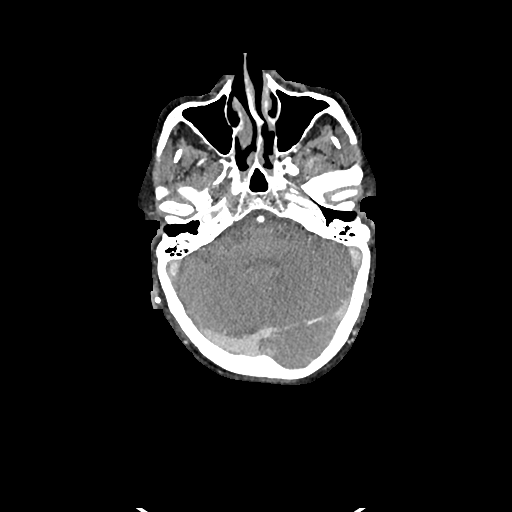
[im 534/777  soft-tissue]
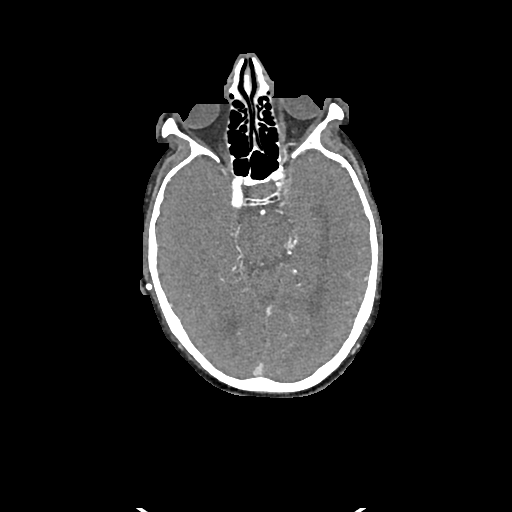
[im 583/777  lung]
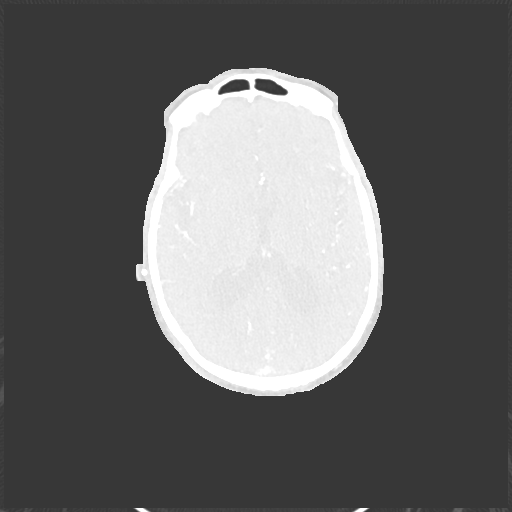
[im 631/777  soft-tissue]
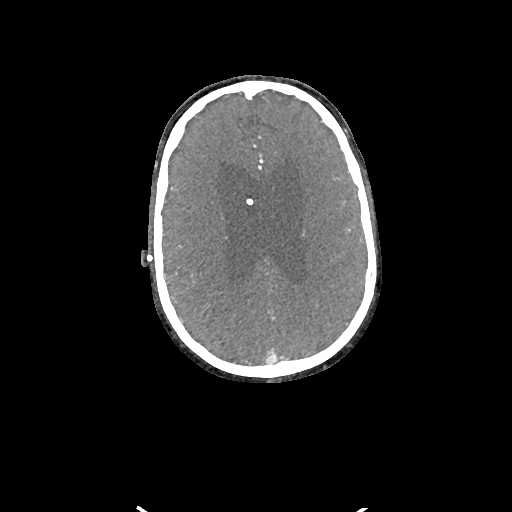
[im 631/777  lung]
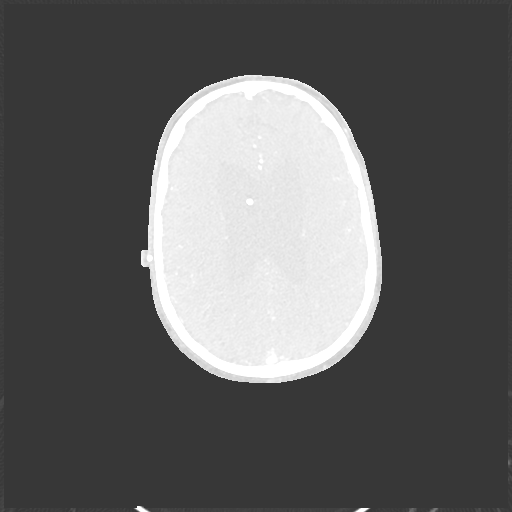
[im 680/777  lung]
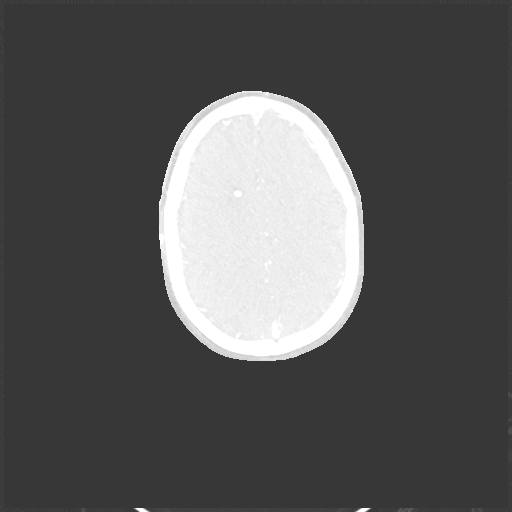
[im 728/777  soft-tissue]
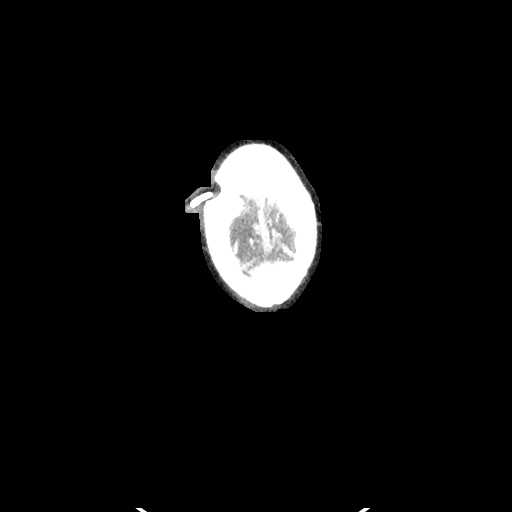
[im 728/777  lung]
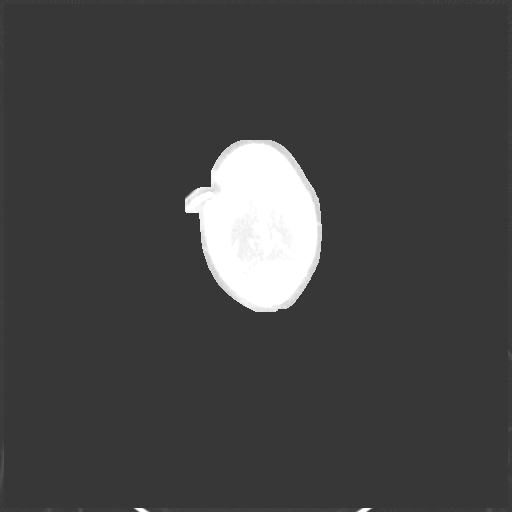
[im 728/777  bone]
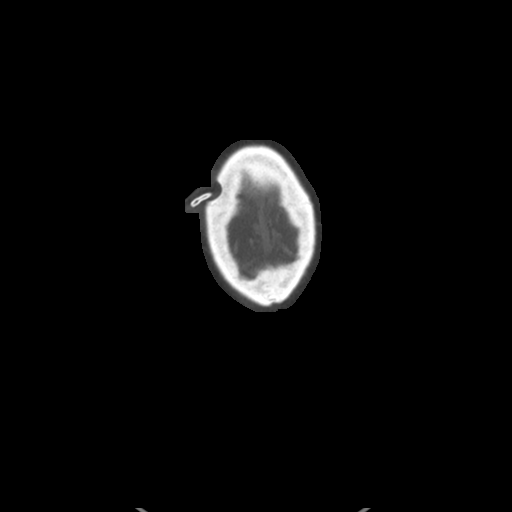

[11 of 46 positions shown; findings below may reference images not displayed]

FINDINGS: CT HEAD FINDINGS

Brain:

Examination limited by motion artifact greatest at the level of the
superior frontal lobes.

Stable position of a right frontoparietal approach ventricular
catheter which terminates within the right lateral ventricle. The
ventricular system has not significantly changed in size or
configuration as compared to prior head CT [DATE].

There is no acute intracranial hemorrhage.

No definite demarcated cortical infarct is identified.

No extra-axial fluid collection.

No evidence of intracranial mass.

No midline shift.

Vascular: Reported below.

Skull: Normal. Negative for fracture or focal lesion.

Sinuses: Mild ethmoid sinus mucosal thickening. No significant
mastoid effusion.

Orbits: No acute abnormality.

Review of the MIP images confirms the above findings

CTA NECK FINDINGS

Aortic arch: Atherosclerotic plaque within the visualized aortic
arch and proximal major branch vessels of the neck. No
hemodynamically significant innominate or proximal subclavian artery
stenosis.

Right carotid system: CCA and ICA patent within the neck without
significant stenosis (50% or greater). Mild calcified plaque at the
carotid bifurcation and within the proximal ICA.

Left carotid system: CCA and ICA patent within the neck without
significant stenosis (50% or greater). Mild mixed plaque within the
carotid bifurcation and proximal ICA.

Vertebral arteries: The right vertebral artery is patent throughout
the neck without significant stenosis. Unchanged from prior
examination [DATE], the left vertebral artery is occluded to the
C1 level. There is reconstitution of irregular flow within the V3
and proximal V4 left vertebral artery, which may be secondary to
retrograde flow. As before, there are multifocal high-grade stenoses
within the V3 and proximal V4 left vertebral artery.

Skeleton: No acute bony abnormality or aggressive osseous lesion.
Cervical spondylosis without high-grade bony spinal canal narrowing.

Other neck: No neck mass or cervical lymphadenopathy.

Upper chest: No consolidation within the imaged lung apices.
Calcified granuloma within the right upper lobe.

Review of the MIP images confirms the above findings

CTA HEAD FINDINGS

Anterior circulation:

The intracranial internal carotid arteries are patent without
significant stenosis.

The M1 middle cerebral arteries are patent without significant
stenosis. No M2 proximal branch occlusion or high-grade proximal
stenosis is identified.

The anterior cerebral arteries are patent without high-grade
proximal stenosis.

No intracranial aneurysm is identified.

Posterior circulation:

As described, there is reconstitution of regular flow within the V3
and proximal V4 left vertebral artery, possibly due to retrograde
flow. Sites of high-grade stenosis within the V3 and proximal V4
left vertebral artery. Flow is demonstrated within the left PICA
proximally.

The intracranial right vertebral artery is patent without
significant stenosis, as is the basilar artery.

Predominantly fetal origin of the posterior predominantly fetal
origin of the left posterior cerebral artery. The posterior cerebral
arteries are patent bilaterally. As before, there is a
moderate/severe stenosis within the proximal P2 left posterior
cerebral artery (series 15, image 56). The right posterior
communicating artery is hypoplastic or absent.

Venous sinuses: Within limitations of contrast timing, no convincing
thrombus.

Anatomic variants: As described

Review of the MIP images confirms the above findings
IMPRESSION: CT head:

1. Examination limited by motion artifact greatest at the level of
the superior frontal lobes.
2. No definite evidence of acute intracranial abnormality.
3. Stable position of a right frontoparietal approach ventricular
catheter. Stable size and configuration of the ventricular system as
compared to prior CT [DATE].
[DATE]. Mild ethmoid sinus mucosal thickening.

CTA neck:

1. The bilateral common and internal carotid arteries are patent
within the neck without significant stenosis. Mild atherosclerotic
plaque within the carotid systems as described.
2. Unchanged from prior examination [DATE], the left vertebral
artery is occluded to the C1 level. There is reconstitution of
irregular flow within the V3 and proximal V4 left vertebral artery,
which may be secondary to retrograde flow. As before, there are
multifocal high-grade stenoses within the V3 and proximal V4 left
vertebral artery.
3. The right vertebral artery is patent throughout the neck without
significant stenosis.

CTA head:

1. Reconstitution of enhancement within the intracranial left
vertebral artery as described, which may be due to retrograde flow.
Redemonstrated multifocal high-grade stenoses within the V3 and
proximal V4 left vertebral artery. The left PICA is patent
proximally.
2. Redemonstrated moderate/severe stenosis within the proximal P2
left posterior cerebral artery.
3. No other intracranial high-grade proximal arterial stenosis is
identified.

## 2019-08-11 IMAGING — DX DG CHEST 1V PORT
1 series · 1 of 1 positions shown · non-contrast
Comparison: [DATE]

CLINICAL DATA: Altered mental status and chest pain

EXAM:
PORTABLE CHEST 1 VIEW

[chest]
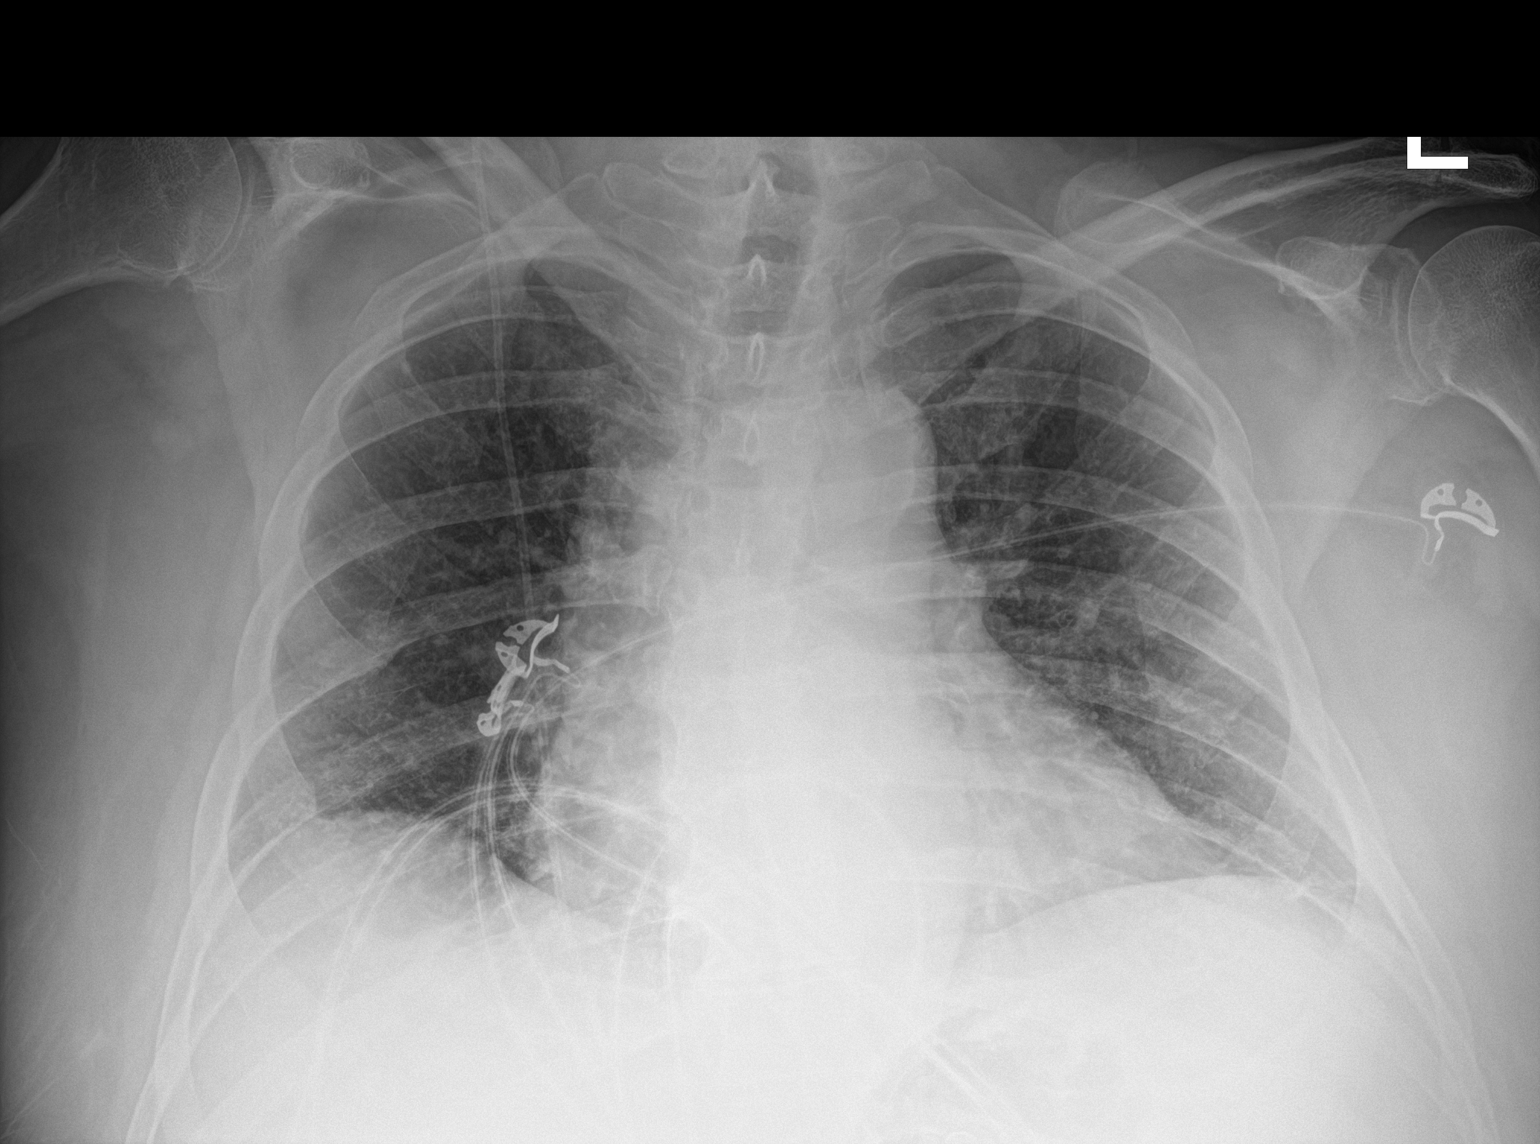

[1 of 1 positions shown; findings below may reference images not displayed]

FINDINGS: Cardiac shadow is enlarged. Aortic calcifications are noted.
Ventricular shunt is noted over the right chest wall. The lungs are
clear. No bony abnormality is noted.
IMPRESSION: No acute abnormality noted.

## 2019-08-11 IMAGING — CT CT ANGIO HEAD
1 of 11 series · 14 of 47 positions shown · IV contrast (OMNI)
Comparison: Noncontrast head CT [DATE], brain MRI [DATE],
CT angiogram head/neck [DATE]

CLINICAL DATA: Altered mental status, unclear cause. Additional
history provided: Expressive aphasia. NIH 1.

EXAM:
CT ANGIOGRAPHY HEAD AND NECK
TECHNIQUE: Multidetector CT imaging of the head and neck was performed using
the standard protocol during bolus administration of intravenous
contrast. Multiplanar CT image reconstructions and MIPs were
obtained to evaluate the vascular anatomy. Carotid stenosis
measurements (when applicable) are obtained utilizing NASCET
criteria, using the distal internal carotid diameter as the
denominator.
CONTRAST:  75mL OMNIPAQUE IOHEXOL 350 MG/ML SOLN

[Series 16: thin (person_name) · axial · 0.61mm/px · z∈[+867,+1204]mm · 14 of 777 slices shown]
[im 52/777  brain]
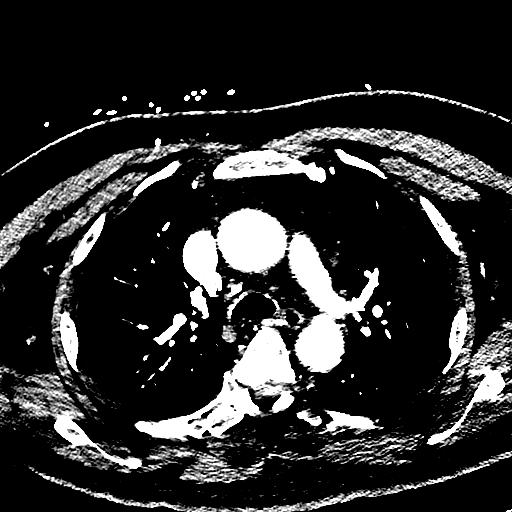
[im 104/777  bone]
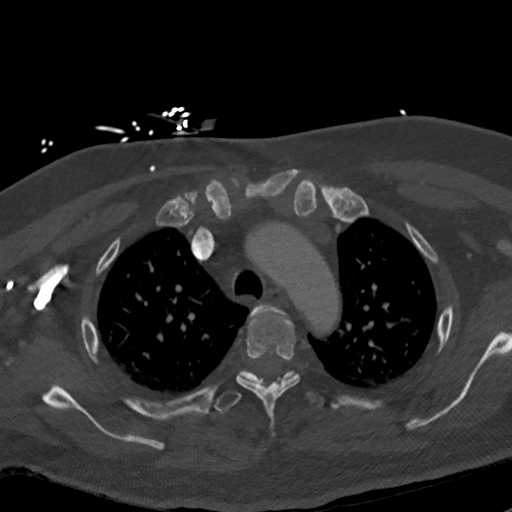
[im 156/777  brain]
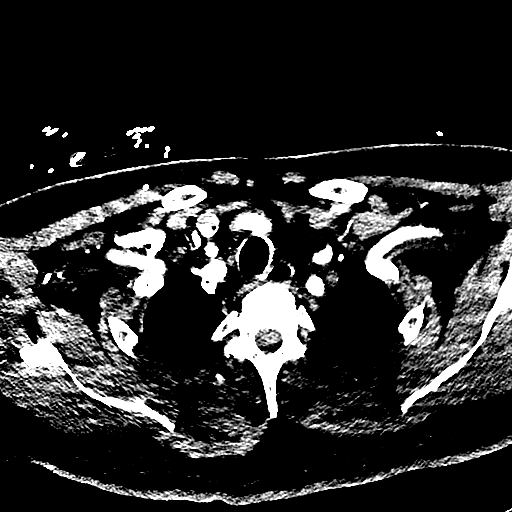
[im 207/777  bone]
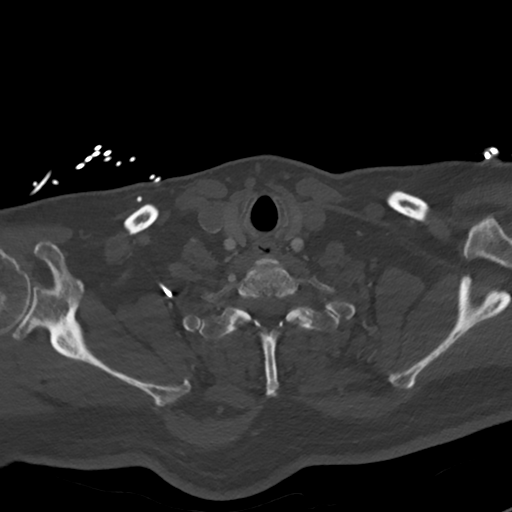
[im 259/777  brain]
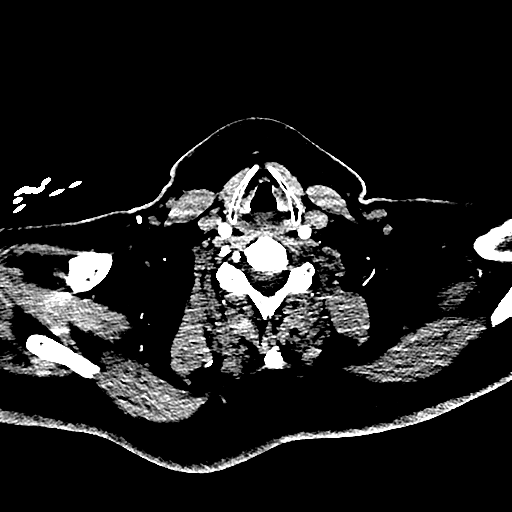
[im 311/777  bone]
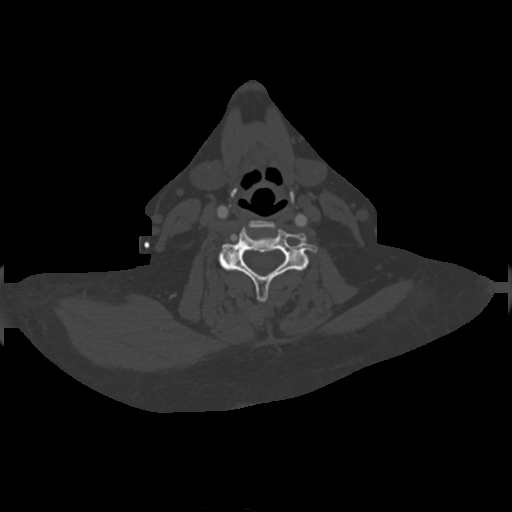
[im 363/777  brain]
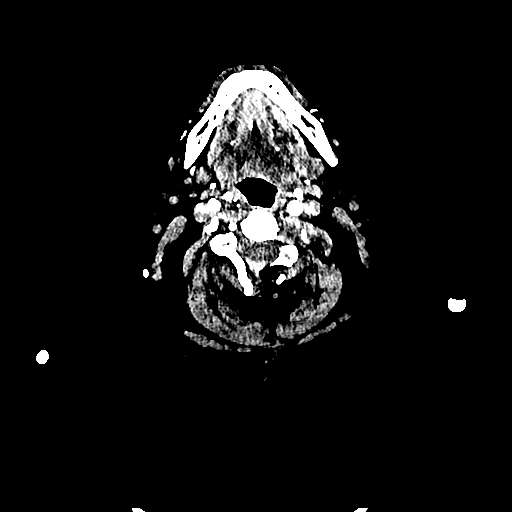
[im 414/777  bone]
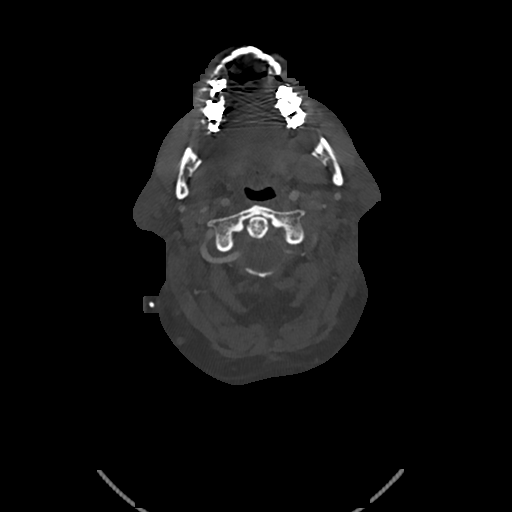
[im 466/777  brain]
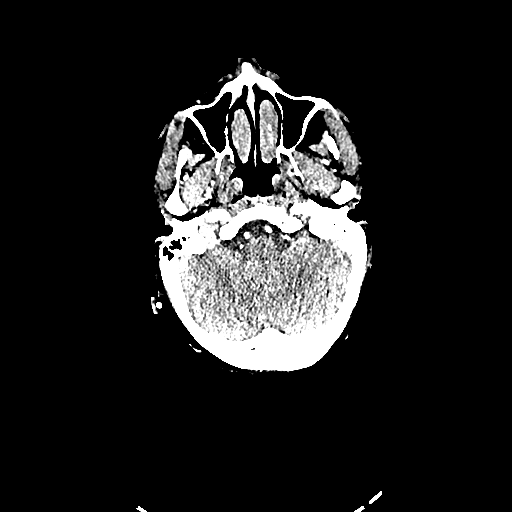
[im 518/777  bone]
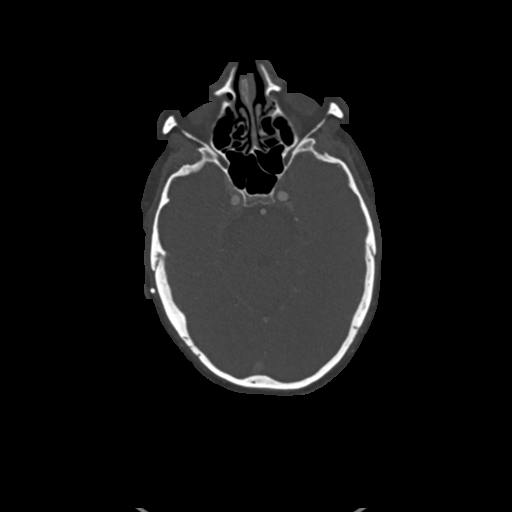
[im 570/777  brain]
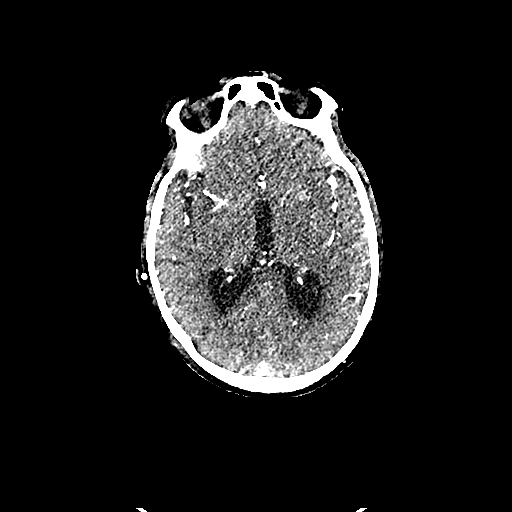
[im 621/777  bone]
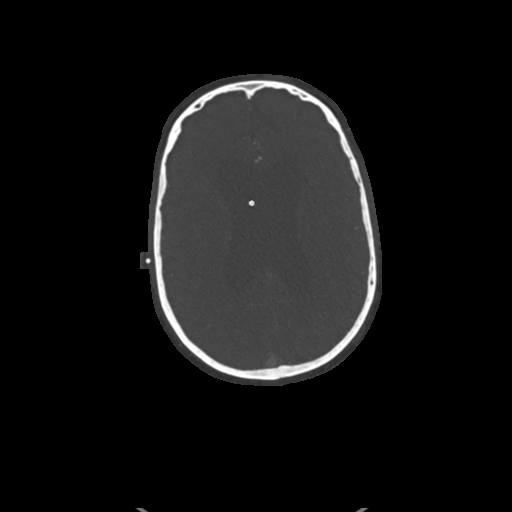
[im 673/777  brain]
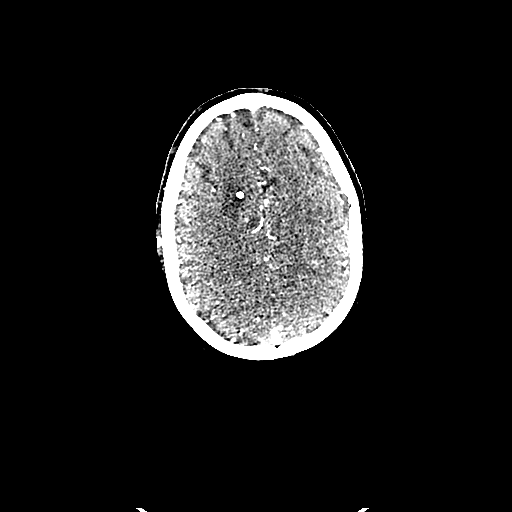
[im 725/777  bone]
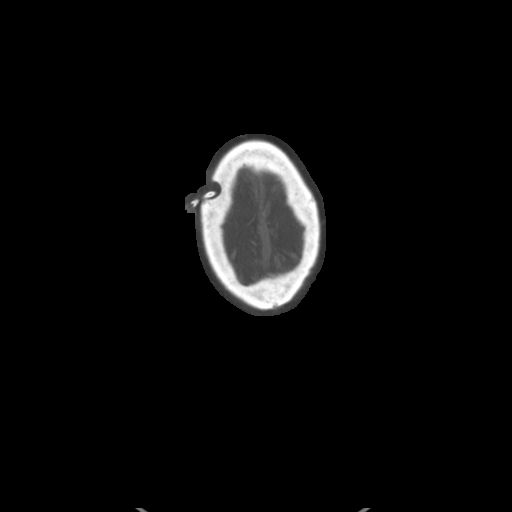

[14 of 47 positions shown; findings below may reference images not displayed]

FINDINGS: CT HEAD FINDINGS

Brain:

Examination limited by motion artifact greatest at the level of the
superior frontal lobes.

Stable position of a right frontoparietal approach ventricular
catheter which terminates within the right lateral ventricle. The
ventricular system has not significantly changed in size or
configuration as compared to prior head CT [DATE].

There is no acute intracranial hemorrhage.

No definite demarcated cortical infarct is identified.

No extra-axial fluid collection.

No evidence of intracranial mass.

No midline shift.

Vascular: Reported below.

Skull: Normal. Negative for fracture or focal lesion.

Sinuses: Mild ethmoid sinus mucosal thickening. No significant
mastoid effusion.

Orbits: No acute abnormality.

Review of the MIP images confirms the above findings

CTA NECK FINDINGS

Aortic arch: Atherosclerotic plaque within the visualized aortic
arch and proximal major branch vessels of the neck. No
hemodynamically significant innominate or proximal subclavian artery
stenosis.

Right carotid system: CCA and ICA patent within the neck without
significant stenosis (50% or greater). Mild calcified plaque at the
carotid bifurcation and within the proximal ICA.

Left carotid system: CCA and ICA patent within the neck without
significant stenosis (50% or greater). Mild mixed plaque within the
carotid bifurcation and proximal ICA.

Vertebral arteries: The right vertebral artery is patent throughout
the neck without significant stenosis. Unchanged from prior
examination [DATE], the left vertebral artery is occluded to the
C1 level. There is reconstitution of irregular flow within the V3
and proximal V4 left vertebral artery, which may be secondary to
retrograde flow. As before, there are multifocal high-grade stenoses
within the V3 and proximal V4 left vertebral artery.

Skeleton: No acute bony abnormality or aggressive osseous lesion.
Cervical spondylosis without high-grade bony spinal canal narrowing.

Other neck: No neck mass or cervical lymphadenopathy.

Upper chest: No consolidation within the imaged lung apices.
Calcified granuloma within the right upper lobe.

Review of the MIP images confirms the above findings

CTA HEAD FINDINGS

Anterior circulation:

The intracranial internal carotid arteries are patent without
significant stenosis.

The M1 middle cerebral arteries are patent without significant
stenosis. No M2 proximal branch occlusion or high-grade proximal
stenosis is identified.

The anterior cerebral arteries are patent without high-grade
proximal stenosis.

No intracranial aneurysm is identified.

Posterior circulation:

As described, there is reconstitution of regular flow within the V3
and proximal V4 left vertebral artery, possibly due to retrograde
flow. Sites of high-grade stenosis within the V3 and proximal V4
left vertebral artery. Flow is demonstrated within the left PICA
proximally.

The intracranial right vertebral artery is patent without
significant stenosis, as is the basilar artery.

Predominantly fetal origin of the posterior predominantly fetal
origin of the left posterior cerebral artery. The posterior cerebral
arteries are patent bilaterally. As before, there is a
moderate/severe stenosis within the proximal P2 left posterior
cerebral artery (series 15, image 56). The right posterior
communicating artery is hypoplastic or absent.

Venous sinuses: Within limitations of contrast timing, no convincing
thrombus.

Anatomic variants: As described

Review of the MIP images confirms the above findings
IMPRESSION: CT head:

1. Examination limited by motion artifact greatest at the level of
the superior frontal lobes.
2. No definite evidence of acute intracranial abnormality.
3. Stable position of a right frontoparietal approach ventricular
catheter. Stable size and configuration of the ventricular system as
compared to prior CT [DATE].
[DATE]. Mild ethmoid sinus mucosal thickening.

CTA neck:

1. The bilateral common and internal carotid arteries are patent
within the neck without significant stenosis. Mild atherosclerotic
plaque within the carotid systems as described.
2. Unchanged from prior examination [DATE], the left vertebral
artery is occluded to the C1 level. There is reconstitution of
irregular flow within the V3 and proximal V4 left vertebral artery,
which may be secondary to retrograde flow. As before, there are
multifocal high-grade stenoses within the V3 and proximal V4 left
vertebral artery.
3. The right vertebral artery is patent throughout the neck without
significant stenosis.

CTA head:

1. Reconstitution of enhancement within the intracranial left
vertebral artery as described, which may be due to retrograde flow.
Redemonstrated multifocal high-grade stenoses within the V3 and
proximal V4 left vertebral artery. The left PICA is patent
proximally.
2. Redemonstrated moderate/severe stenosis within the proximal P2
left posterior cerebral artery.
3. No other intracranial high-grade proximal arterial stenosis is
identified.

## 2019-08-11 MED ORDER — SODIUM CHLORIDE 0.9% FLUSH: 3.0000 mL | Freq: Once | INTRAVENOUS | Status: DC

## 2019-08-11 MED ORDER — IOHEXOL 350 MG/ML SOLN
75.0000 mL | Freq: Once | INTRAVENOUS | Status: AC | PRN
  Administered 2019-08-11: 75 mL via INTRAVENOUS

## 2019-08-11 MED ORDER — LEVETIRACETAM 500 MG PO TABS
500.0000 mg | ORAL_TABLET | Freq: Once | ORAL | Status: AC
  Administered 2019-08-11: 500 mg via ORAL
  Filled 2019-08-11: qty 1

## 2019-08-11 NOTE — ED Notes (Signed)
Pt transported to CT ?

## 2019-08-11 NOTE — ED Provider Notes (Signed)
[facility] EMERGENCY DEPARTMENT Provider Note   CSN: [id number] Arrival date & time: 08/11/19  1842     History Chief Complaint  Patient presents with  . Aphasia    [name] Frank. is a 69 y.o. male.  69 year old male brought in by EMS for change in mental status.  Patient's wife is at bedside, states she came home from work yesterday at 5 PM and was concerned due to change in speech, states patient seemed confused and was having difficulty finding the correct words.  Patient slurs words at times and at other times says the wrong word.  Wife has also noticed left-sided facial droop.  No difficulty ambulating.  Patient states that he has felt confused for the past 2 days.  Wife reports prior history of head injury last year, has a shunt in place.  Patient is not anticoagulated.  Does have history of paroxysmal A. fib, STEMI with stent, hyperlipidemia, hypertension.        Past Medical History:  Diagnosis Date  . Abnormality of gait    uses walker and wheelchair  . Cognitive deficit as late effect of traumatic brain injury (HCC)   . Coronary artery disease    cardiologist-- dr s. Dickie La---  04-06-2014  NSTEMI  s/p  cardiac cath DES x1 to LAD and POBA to D1  . Dysrhythmia   . History of non-ST elevation myocardial infarction (NSTEMI)    04-06-2013  s/p  coronary stent and PCI  . Hydrocele, bilateral   . Hyperlipemia   . Hypertension   . Lower urinary tract symptoms (LUTS)   . Memory deficit   . PAF (paroxysmal atrial fibrillation) (HCC) cardilogist-- dr Dickie La   on-set 05/ 2020 during admission for [facility],  w/ RVR,  with cardizem/ toprol, back in NSR,  f/u by cardiologist , event monitor 09-16-2018 no results in epic by per pt wife was told result ok with no afib  . S/P drug eluting coronary stent placement    04-06-2014  @[facility]   DES x1 to LAD and POBA to D1  . SAH (subarachnoid hemorrhage) [facility]) neurologist-  dr [facility]--- last head CT 10-01-2018 resolving  (10-23-2018  residual deficits memory issues, abnormal gait, congnitive   w/ SDH due to unwitnessed fall, traumatic head injury---- admission 07-14-2018 , d/c'd 08-01-2018,  readmitted 3 days later with late TBI effects,  d/c'd from rehab 08-22-2018  . Seizure disorder [facility])    followed by dr [facility]  . Subdural hematoma (HCC)   . Type 2 diabetes mellitus (HCC)    followed by pcp  . Urinary incontinence    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    Patient Active Problem List   Diagnosis Date Noted  . Unspecified abnormalities of gait and mobility 12/16/2018  . 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 Normal pressure hydrocephalus (HCC) 12/02/2018  . History of subdural hematoma 11/18/2018  . Urinary incontinence 11/18/2018  . Memory loss 10/09/2018  . Right hemiparesis (HCC) 09/04/2018  . Cognitive deficit as late effect of traumatic brain injury (HCC) 09/04/2018  . Bradycardia   . Leukopenia   . Hypoalbuminemia due to protein-calorie malnutrition (HCC)   . Hydrocele   . Hydrocephalus (HCC) 07/28/2018  . Mild renal insufficiency 07/28/2018  . Paroxysmal atrial fibrillation (HCC) 07/28/2018  . Subdural hematoma (HCC) 07/14/2018  . Hypertension, essential 01/16/2018  . Controlled type 2 diabetes mellitus without complication, without long-term current use of insulin (HCC) 01/16/2018  . Coronary artery disease involving native  heart without angina pectoris 01/16/2018    Past Surgical History:  Procedure Laterality Date  . CATARACT EXTRACTION W/ INTRAOCULAR LENS  IMPLANT, BILATERAL  2008; 2009  . CORONARY ANGIOPLASTY WITH STENT PLACEMENT  04-06-2014     DES x1 to LAD and POBA to D1  . HYDROCELE EXCISION Bilateral 10/27/2018   Procedure: HYDROCELECTOMY ADULT;  Surgeon: Ihor Gully, MD;  Location: [facility];  Service: Urology;  Laterality: Bilateral;  . TONSILLECTOMY  child  . 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   Procedure: Right  ventriculoperitoneal shunt placement;  Surgeon: Jadene Pierini, MD;  Location: [facility] OR;  Service: Neurosurgery;  Laterality: Right;       Family History  Problem Relation Age of Onset  . Stroke Father   . Hypertension Father   . Hyperlipidemia Father   . Early death Mother   . Colitis Sister   . Appendicitis Brother     Social History   Tobacco Use  . Smoking status: Never Smoker  . Smokeless tobacco: Never Used  Substance Use Topics  . Alcohol use: Not Currently    Comment: VERY RARELY  . Drug use: Never    Home Medications Prior to Admission medications   Medication Sig Start Date End Date Taking? Authorizing Provider  atorvastatin (LIPITOR) 80 MG tablet Take 1 tablet (80 mg total) by mouth daily. Patient taking differently: Take 80 mg by mouth every evening.  02/19/19  Yes Copland, Gwenlyn Found, MD  glipiZIDE (GLUCOTROL XL) 10 MG 24 hr tablet Take 1 tablet (10 mg total) by mouth 2 (two) times daily. 02/19/19  Yes Copland, Gwenlyn Found, MD  levETIRAcetam (KEPPRA) 500 MG tablet Take 1 tablet (500 mg total) by mouth 2 (two) times daily. 03/17/19  Yes Levert Feinstein, MD  metFORMIN (GLUCOPHAGE) 500 MG tablet Take 1 tablet (500 mg total) by mouth 2 (two) times daily with a meal. 12/18/18  Yes Copland, Gwenlyn Found, MD  metoprolol succinate (TOPROL-XL) 50 MG 24 hr tablet Take 1 tablet (50 mg total) by mouth daily. Take with or immediately following a meal. 02/19/19  Yes Copland, Gwenlyn Found, MD  acetaminophen (TYLENOL) 325 MG tablet Take 2 tablets (650 mg total) by mouth every 6 (six) hours as needed for mild pain. Patient not taking: Reported on 08/11/2019 07/18/18   Juliet Rude, PA-C  tamsulosin (FLOMAX) 0.4 MG CAPS capsule Take 1 capsule (0.4 mg total) by mouth daily after supper. Patient not taking: Reported on 08/11/2019 08/22/18   Jacquelynn Cree, PA-C    Allergies    Patient has no known allergies.  Review of Systems   Review of Systems  Unable to perform ROS: Mental status change    Musculoskeletal: Negative for gait problem.  Neurological: Positive for speech difficulty.  Psychiatric/Behavioral: Positive for confusion.    Physical Exam Updated Vital Signs BP 137/64   Pulse (!) 54   Temp 99.1 F (37.3 C) (Oral)   Resp (!) 22   SpO2 96%   Physical Exam Vitals and nursing note reviewed.  Constitutional:      General: He is not in acute distress.    Appearance: He is well-developed. He is not diaphoretic.  HENT:     Head: Normocephalic and atraumatic.     Mouth/Throat:     Mouth: Mucous membranes are dry.  Eyes:     Extraocular Movements: Extraocular movements intact.     Pupils: Pupils are equal, round, and reactive to light.  Cardiovascular:     Rate  and Rhythm: Normal rate and regular rhythm.     Pulses: Normal pulses.     Heart sounds: Normal heart sounds.  Pulmonary:     Effort: Pulmonary effort is normal.     Breath sounds: Normal breath sounds.  Abdominal:     Palpations: Abdomen is soft.     Tenderness: There is no abdominal tenderness.  Musculoskeletal:     Right lower leg: No edema.     Left lower leg: No edema.  Skin:    General: Skin is warm and dry.     Findings: No erythema or rash.  Neurological:     Mental Status: He is alert.     GCS: GCS eye subscore is 4. GCS verbal subscore is 4. GCS motor subscore is 6.     Cranial Nerves: Facial asymmetry present.     Motor: No weakness or pronator drift.     Comments: Left facial weakness. Sensation intact. Equal arm and leg strength. Difficulty following commands, unable to do heel to shin due to inability to follow commands.      ED Results / Procedures / Treatments   Labs (all labs ordered are listed, but only abnormal results are displayed) Labs Reviewed  CBC - Abnormal; Notable for the following components:      Result Value   Platelets 146 (*)    All other components within normal limits  COMPREHENSIVE METABOLIC PANEL - Abnormal; Notable for the following components:    Glucose, Bld 302 (*)    Total Protein 6.3 (*)    All other components within normal limits  URINALYSIS, ROUTINE W REFLEX MICROSCOPIC - Abnormal; Notable for the following components:   Specific Gravity, Urine >1.046 (*)    Glucose, UA >=500 (*)    All other components within normal limits  SARS CORONAVIRUS 2 BY RT PCR (HOSPITAL ORDER, PERFORMED IN  HOSPITAL LAB)  PROTIME-INR  APTT  DIFFERENTIAL  CBG MONITORING, ED    EKG EKG Interpretation  Date/Time:  Tuesday August 11 2019 20:03:48 EDT Ventricular Rate:  53 PR Interval:    QRS Duration: 124 QT Interval:  439 QTC Calculation: 413 R Axis:   -4 Text Interpretation: Sinus rhythm Nonspecific intraventricular conduction delay Confirmed by Tilden Fossa [phone]) on 08/11/2019 9:40:09 PM   Radiology CT Angio Head W/Cm &/Or Wo Cm  Result Date: 08/11/2019 CLINICAL DATA:  Altered mental status, unclear cause. Additional history provided: Expressive aphasia. NIH 1. EXAM: CT ANGIOGRAPHY HEAD AND NECK TECHNIQUE: Multidetector CT imaging of the head and neck was performed using the standard protocol during bolus administration of intravenous contrast. Multiplanar CT image reconstructions and MIPs were obtained to evaluate the vascular anatomy. Carotid stenosis measurements (when applicable) are obtained utilizing NASCET criteria, using the distal internal carotid diameter as the denominator. CONTRAST:  75mL OMNIPAQUE IOHEXOL 350 MG/ML SOLN COMPARISON:  Noncontrast head CT 03/02/2019, brain MRI 11/15/2018, CT angiogram head/neck 07/28/2018 FINDINGS: CT HEAD FINDINGS Brain: Examination limited by motion artifact greatest at the level of the superior frontal lobes. Stable position of a right frontoparietal approach ventricular catheter which terminates within the right lateral ventricle. The ventricular system has not significantly changed in size or configuration as compared to prior head CT 03/02/2019. There is no acute intracranial  hemorrhage. No definite demarcated cortical infarct is identified. No extra-axial fluid collection. No evidence of intracranial mass. No midline shift. Vascular: Reported below. Skull: Normal. Negative for fracture or focal lesion. Sinuses: Mild ethmoid sinus mucosal thickening. No significant mastoid effusion.  Orbits: No acute abnormality. Review of the MIP images confirms the above findings CTA NECK FINDINGS Aortic arch: Atherosclerotic plaque within the visualized aortic arch and proximal major branch vessels of the neck. No hemodynamically significant innominate or proximal subclavian artery stenosis. Right carotid system: CCA and ICA patent within the neck without significant stenosis (50% or greater). Mild calcified plaque at the carotid bifurcation and within the proximal ICA. Left carotid system: CCA and ICA patent within the neck without significant stenosis (50% or greater). Mild mixed plaque within the carotid bifurcation and proximal ICA. Vertebral arteries: The right vertebral artery is patent throughout the neck without significant stenosis. 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 There is reconstitution of irregular flow within the V3 and proximal V4 left vertebral artery, which may be secondary to retrograde flow. As before, there are multifocal high-grade stenoses within the V3 and proximal V4 left vertebral artery. Skeleton: No acute bony abnormality or aggressive osseous lesion. Cervical spondylosis without high-grade bony spinal canal narrowing. Other neck: No neck mass or cervical lymphadenopathy. Upper chest: No consolidation within the imaged lung apices. Calcified granuloma within the right upper lobe. Review of the MIP images confirms the above findings CTA HEAD FINDINGS Anterior circulation: The intracranial internal carotid arteries are patent without significant stenosis. The M1 middle cerebral arteries are patent without  significant stenosis. No M2 proximal branch occlusion or high-grade proximal stenosis is identified. The anterior cerebral arteries are patent without high-grade proximal stenosis. No intracranial aneurysm is identified. Posterior circulation: As described, there is reconstitution of regular flow within the V3 and proximal V4 left vertebral artery, possibly due to retrograde flow. Sites of high-grade stenosis within the V3 and proximal V4 left vertebral artery. Flow is demonstrated within the left PICA proximally. The intracranial right vertebral artery is patent without significant stenosis, as is the basilar artery. Predominantly fetal origin of the posterior predominantly fetal origin of the left posterior cerebral artery. The posterior cerebral arteries are patent bilaterally. As before, there is a moderate/severe stenosis within the proximal P2 left posterior cerebral artery (series 15, image 56). The right posterior communicating artery is hypoplastic or absent. Venous sinuses: Within limitations of contrast timing, no convincing thrombus. Anatomic variants: As described Review of the MIP images confirms the above findings IMPRESSION: CT head: 1. Examination limited by motion artifact greatest at the level of the superior frontal lobes. 2. No definite evidence of acute intracranial abnormality. 3. Stable position of a right frontoparietal approach ventricular catheter. Stable size and configuration of the ventricular system as compared to prior CT 03/02/2019. 4. Mild ethmoid sinus mucosal thickening. CTA neck: 1. The bilateral common and internal carotid arteries are patent within the neck without significant stenosis. Mild atherosclerotic plaque within the carotid systems as described. 2. 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 There is reconstitution of irregular flow within the V3 and proximal V4 left vertebral artery, which may be secondary to  retrograde flow. As before, there are multifocal high-grade stenoses within the V3 and proximal V4 left vertebral artery. 3. The right vertebral artery is patent throughout the neck without significant stenosis. CTA head: 1. Reconstitution of enhancement within the intracranial left vertebral artery as described, which may be due to retrograde flow. Redemonstrated multifocal high-grade stenoses within the V3 and proximal V4 left vertebral artery. The left PICA is patent proximally. 2. Redemonstrated moderate/severe stenosis within the proximal P2 left posterior cerebral  artery. 3. No other intracranial high-grade proximal arterial stenosis is identified. Electronically Signed   By: Jackey Loge DO   On: 08/11/2019 21:47   CT Angio Neck W and/or Wo Contrast  Result Date: 08/11/2019 CLINICAL DATA:  Altered mental status, unclear cause. Additional history provided: Expressive aphasia. NIH 1. EXAM: CT ANGIOGRAPHY HEAD AND NECK TECHNIQUE: Multidetector CT imaging of the head and neck was performed using the standard protocol during bolus administration of intravenous contrast. Multiplanar CT image reconstructions and MIPs were obtained to evaluate the vascular anatomy. Carotid stenosis measurements (when applicable) are obtained utilizing NASCET criteria, using the distal internal carotid diameter as the denominator. CONTRAST:  59mL OMNIPAQUE IOHEXOL 350 MG/ML SOLN COMPARISON:  Noncontrast head CT 03/02/2019, brain MRI 11/15/2018, CT angiogram head/neck 07/28/2018 FINDINGS: CT HEAD FINDINGS Brain: Examination limited by motion artifact greatest at the level of the superior frontal lobes. Stable position of a right frontoparietal approach ventricular catheter which terminates within the right lateral ventricle. The ventricular system has not significantly changed in size or configuration as compared to prior head CT 03/02/2019. There is no acute intracranial hemorrhage. No definite demarcated cortical infarct is  identified. No extra-axial fluid collection. No evidence of intracranial mass. No midline shift. Vascular: Reported below. Skull: Normal. Negative for fracture or focal lesion. Sinuses: Mild ethmoid sinus mucosal thickening. No significant mastoid effusion. Orbits: No acute abnormality. Review of the MIP images confirms the above findings CTA NECK FINDINGS Aortic arch: Atherosclerotic plaque within the visualized aortic arch and proximal major branch vessels of the neck. No hemodynamically significant innominate or proximal subclavian artery stenosis. Right carotid system: CCA and ICA patent within the neck without significant stenosis (50% or greater). Mild calcified plaque at the carotid bifurcation and within the proximal ICA. Left carotid system: CCA and ICA patent within the neck without significant stenosis (50% or greater). Mild mixed plaque within the carotid bifurcation and proximal ICA. Vertebral arteries: The right vertebral artery is patent throughout the neck without significant stenosis. 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 There is reconstitution of irregular flow within the V3 and proximal V4 left vertebral artery, which may be secondary to retrograde flow. As before, there are multifocal high-grade stenoses within the V3 and proximal V4 left vertebral artery. Skeleton: No acute bony abnormality or aggressive osseous lesion. Cervical spondylosis without high-grade bony spinal canal narrowing. Other neck: No neck mass or cervical lymphadenopathy. Upper chest: No consolidation within the imaged lung apices. Calcified granuloma within the right upper lobe. Review of the MIP images confirms the above findings CTA HEAD FINDINGS Anterior circulation: The intracranial internal carotid arteries are patent without significant stenosis. The M1 middle cerebral arteries are patent without significant stenosis. No M2 proximal branch occlusion or  high-grade proximal stenosis is identified. The anterior cerebral arteries are patent without high-grade proximal stenosis. No intracranial aneurysm is identified. Posterior circulation: As described, there is reconstitution of regular flow within the V3 and proximal V4 left vertebral artery, possibly due to retrograde flow. Sites of high-grade stenosis within the V3 and proximal V4 left vertebral artery. Flow is demonstrated within the left PICA proximally. The intracranial right vertebral artery is patent without significant stenosis, as is the basilar artery. Predominantly fetal origin of the posterior predominantly fetal origin of the left posterior cerebral artery. The posterior cerebral arteries are patent bilaterally. As before, there is a moderate/severe stenosis within the proximal P2 left posterior cerebral artery (series 15, image 56). The  right posterior communicating artery is hypoplastic or absent. Venous sinuses: Within limitations of contrast timing, no convincing thrombus. Anatomic variants: As described Review of the MIP images confirms the above findings IMPRESSION: CT head: 1. Examination limited by motion artifact greatest at the level of the superior frontal lobes. 2. No definite evidence of acute intracranial abnormality. 3. Stable position of a right frontoparietal approach ventricular catheter. Stable size and configuration of the ventricular system as compared to prior CT 03/02/2019. 4. Mild ethmoid sinus mucosal thickening. CTA neck: 1. The bilateral common and internal carotid arteries are patent within the neck without significant stenosis. Mild atherosclerotic plaque within the carotid systems as described. 2. 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 There is reconstitution of irregular flow within the V3 and proximal V4 left vertebral artery, which may be secondary to retrograde flow. As before, there are multifocal high-grade  stenoses within the V3 and proximal V4 left vertebral artery. 3. The right vertebral artery is patent throughout the neck without significant stenosis. CTA head: 1. Reconstitution of enhancement within the intracranial left vertebral artery as described, which may be due to retrograde flow. Redemonstrated multifocal high-grade stenoses within the V3 and proximal V4 left vertebral artery. The left PICA is patent proximally. 2. Redemonstrated moderate/severe stenosis within the proximal P2 left posterior cerebral artery. 3. No other intracranial high-grade proximal arterial stenosis is identified. Electronically Signed   By: Kellie Simmering DO   On: 08/11/2019 21:47   DG Chest Port 1 View  Result Date: 08/11/2019 CLINICAL DATA:  Altered mental status and chest pain EXAM: PORTABLE CHEST 1 VIEW COMPARISON:  07/18/2018 FINDINGS: Cardiac shadow is enlarged. Aortic calcifications are noted. Ventricular shunt is noted over the right chest wall. The lungs are clear. No bony abnormality is noted. IMPRESSION: No acute abnormality noted. Electronically Signed   By: Inez Catalina M.D.   On: 08/11/2019 22:25    Procedures Procedures (including critical care time)  Medications Ordered in ED Medications  sodium chloride flush (NS) 0.9 % injection 3 mL (3 mLs Intravenous Not Given 08/11/19 1917)  iohexol (OMNIPAQUE) 350 MG/ML injection 75 mL (75 mLs Intravenous Contrast Given 08/11/19 2038)  levETIRAcetam (KEPPRA) tablet 500 mg (500 mg Oral Given 08/11/19 2317)    ED Course  I have reviewed the triage vital signs and the nursing notes.  Pertinent labs & imaging results that were available during my care of the patient were reviewed by me and considered in my medical decision making (see chart for details).  Clinical Course as of Aug 11 2350  Tue Aug 10, 5845  [id number] 69 year old male brought in by EMS from home for change in mental status, change in speech, left-sided facial weakness.  Patient's wife first noticed symptoms  yesterday around 5 PM when she returned home from work, symptoms worse today. Exam is difficult this patient does not easily follow commands, does appear to have left-sided facial weakness particularly in the left periorbital area.  No pronator drift, no unilateral arm or leg weakness, sensation intact. Patient was also seen by Dr. Ralene Bathe, ER attending.  CTA head and neck appear stable. Case discussed with Dr. Malen Gauze on-call with neuro hospitalist service, recommends check Keppra compliance and dose as needed, check urinalysis and chest x-ray and MRI.  Patient has had similar evaluations in the past which were negative.  Does not require shunt series at this time.  Plan is to consult neurology if MRI is abnormal otherwise  patient may follow-up with neurology outpatient.   [LM]  2245 Discussed results and plan of care with patient and wife, patient does seem to have improvement in his mental status and speech at this time.  Patient has removed his IV, assisted with wound and change.  Patient is working on a urine sample with plan for chest x-ray and MRI.  Patient and wife are not certain if he has been compliant with his Keppra, wife believes that she has this in his pillbox.   [LM]  2245 Care signed out at end of shift pending MRI.   [LM]    Clinical Course User Index [LM] Alden Hipp   MDM Rules/Calculators/A&P                      Final Clinical Impression(s) / ED Diagnoses Final diagnoses:  None    Rx / DC Orders ED Discharge Orders    None       Alden Hipp 08/11/19 2352    Tilden Fossa, MD 08/12/19 1125

## 2019-08-11 NOTE — ED Triage Notes (Signed)
Patient arrives to ED with complaints of expressive aphasia. Per EMS patients last known normal was 9pm last night per wife. Patient states that he felt off last night but this morning when he woke up he is not able to say every word that he is thinking or wants to say. Patient denies weakness, vision abnormalities, and headache but states he feels a bit confused. NIH is 1.

## 2019-08-11 NOTE — ED Provider Notes (Signed)
69 year old male with history of SAH and TBI with shunt placement who presents with aphasia, word finding difficulties and L sided facial droop since 5pm yesterday. Exam was remarkable for confusion and word finding problems. Neurology was consulted. Apparently he's had similar presentations before and was admitted for possible stroke last year. He was started on Keppra at that time and mental status improved. He has not been taking Keppra as prescribed and was given a dose of Keprra here. Neurology was consulted and MRI pending at shift change. If negative, he can be discharged home with outpatient f/u.  MRI is negative. Discussed with the patient and his wife. He is still having some word finding difficulty but is alert and oriented. Recommended outpatient f/u with neurology.   Bethel Born, PA-C 08/12/19 Joselyn Glassman, MD 08/12/19 [phone]

## 2019-08-12 ENCOUNTER — Ambulatory Visit: Payer: Medicare HMO | Admitting: Neurology

## 2019-08-12 ENCOUNTER — Encounter: Payer: Self-pay | Admitting: Neurology

## 2019-08-12 ENCOUNTER — Emergency Department (HOSPITAL_COMMUNITY): Payer: Medicare HMO

## 2019-08-12 VITALS — BP 116/69 | HR 69 | Ht 70.0 in | Wt 223.0 lb

## 2019-08-12 DIAGNOSIS — R4182 Altered mental status, unspecified: Secondary | ICD-10-CM | POA: Diagnosis not present

## 2019-08-12 DIAGNOSIS — G40909 Epilepsy, unspecified, not intractable, without status epilepticus: Secondary | ICD-10-CM | POA: Diagnosis not present

## 2019-08-12 DIAGNOSIS — R7309 Other abnormal glucose: Secondary | ICD-10-CM | POA: Diagnosis not present

## 2019-08-12 DIAGNOSIS — R410 Disorientation, unspecified: Secondary | ICD-10-CM

## 2019-08-12 DIAGNOSIS — G912 (Idiopathic) normal pressure hydrocephalus: Secondary | ICD-10-CM

## 2019-08-12 DIAGNOSIS — Z8679 Personal history of other diseases of the circulatory system: Secondary | ICD-10-CM | POA: Diagnosis not present

## 2019-08-12 DIAGNOSIS — R799 Abnormal finding of blood chemistry, unspecified: Secondary | ICD-10-CM | POA: Diagnosis not present

## 2019-08-12 DIAGNOSIS — R7989 Other specified abnormal findings of blood chemistry: Secondary | ICD-10-CM | POA: Diagnosis not present

## 2019-08-12 DIAGNOSIS — Z79899 Other long term (current) drug therapy: Secondary | ICD-10-CM | POA: Diagnosis not present

## 2019-08-12 LAB — CBG MONITORING, ED: Glucose-Capillary: 249 mg/dL — ABNORMAL HIGH (ref 70–99)

## 2019-08-12 IMAGING — MR MR HEAD W/O CM
10 of 11 series · 43 of 48 positions shown · non-contrast
Comparison: Prior CTs from [DATE].

CLINICAL DATA: Initial evaluation for acute expressive aphasia,
altered mental status.

EXAM:
MRI HEAD WITHOUT CONTRAST
TECHNIQUE: Multiplanar, multiecho pulse sequences of the brain and surrounding
structures were obtained without intravenous contrast.

[Series 9: DWI · axial · 3.0mm · 0.88mm/px · z∈[-44,+109]mm · 10 of 106 slices shown (1 of 4)]
[im 1/106]
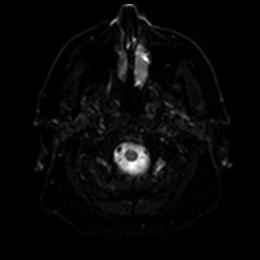
[im 12/106]
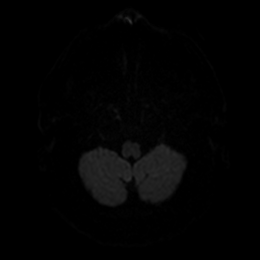
[im 24/106]
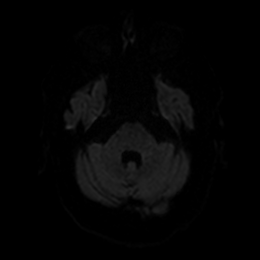
[im 36/106]
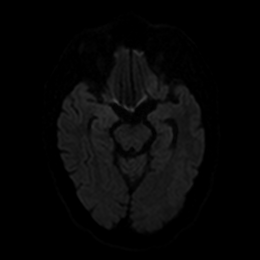
[im 47/106]
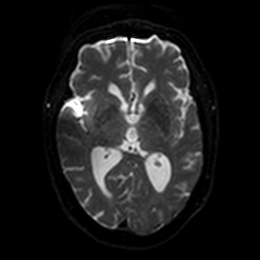
[im 59/106]
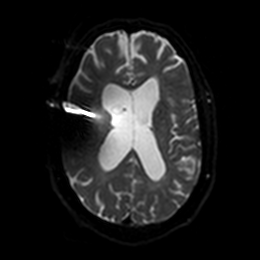
[im 71/106]
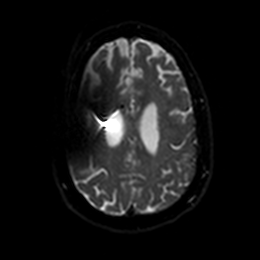
[im 82/106]
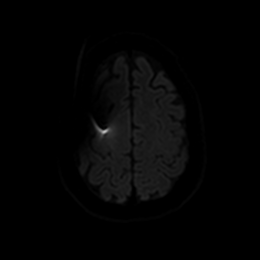
[im 94/106]
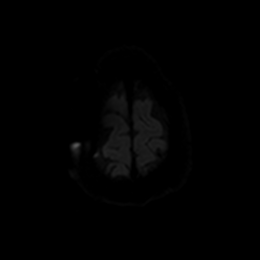
[im 106/106]
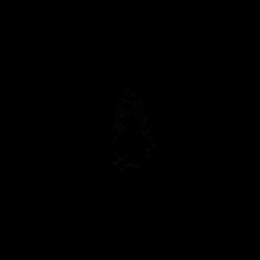

[Series 10: DWI · axial · 3.0mm · 0.88mm/px · z∈[-44,+109]mm · 5 of 53 slices shown (2 of 4)]
[im 1/53]
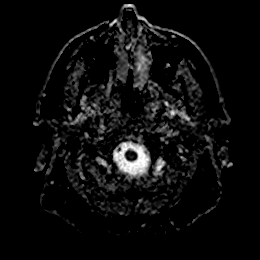
[im 14/53]
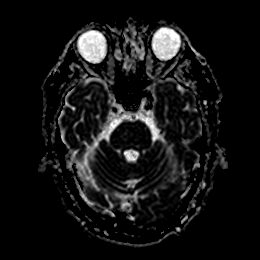
[im 27/53]
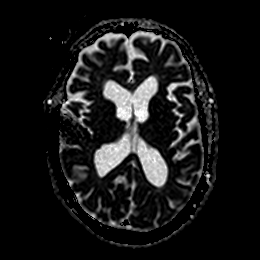
[im 40/53]
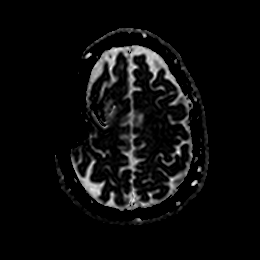
[im 53/53]
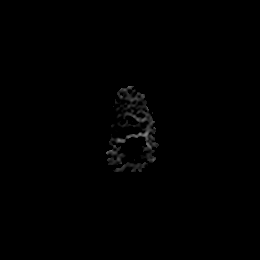

[Series 11: DWI · coronal · 4.0mm · 0.88mm/px · 6 of 69 slices shown (3 of 4)]
[im 1/69]
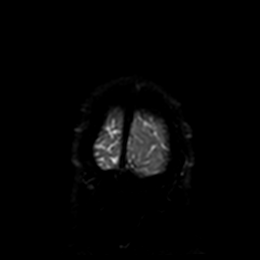
[im 14/69]
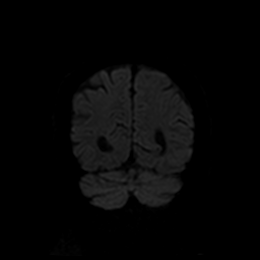
[im 28/69]
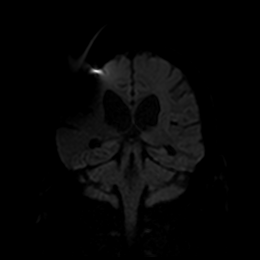
[im 41/69]
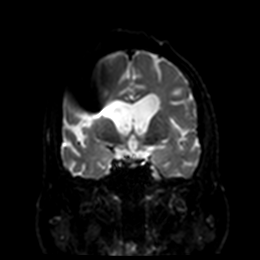
[im 55/69]
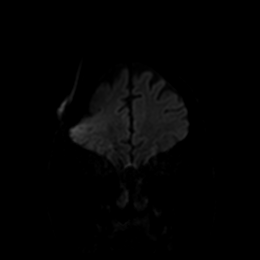
[im 69/69]
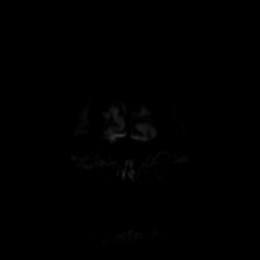

[Series 12: DWI · coronal · 4.0mm · 0.88mm/px · 3 of 35 slices shown (4 of 4)]
[im 1/35]
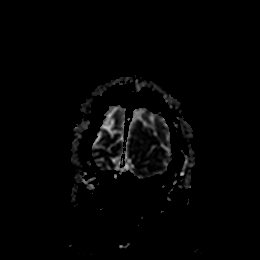
[im 18/35]
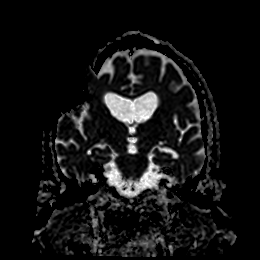
[im 35/35]
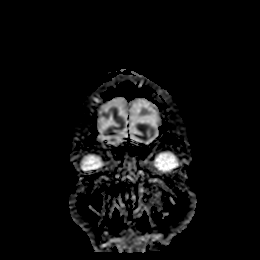

[Series 13: T1 · sagittal · 5.0mm · 0.75mm/px · 2 of 25 slices shown]
[im 1/25]
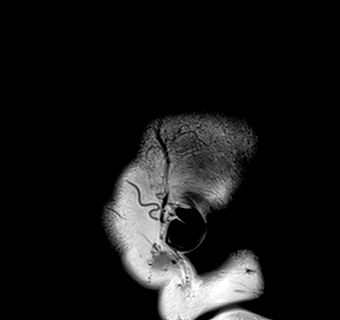
[im 25/25]
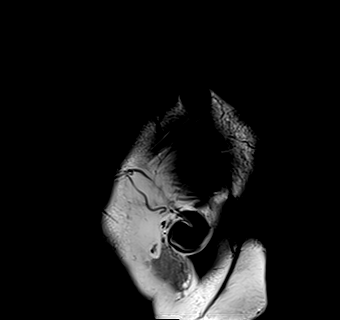

[Series 14: T2 · axial · 5.0mm · 0.72mm/px · z∈[-44,+109]mm · 2 of 27 slices shown (1 of 2)]
[im 1/27]
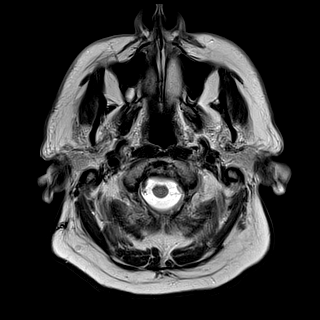
[im 27/27]
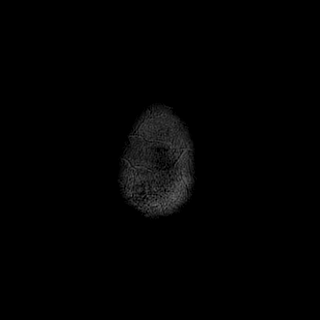

[Series 15: FLAIR · axial · 5.0mm · 0.45mm/px · z∈[-46,+107]mm · 2 of 27 slices shown]
[im 1/27]
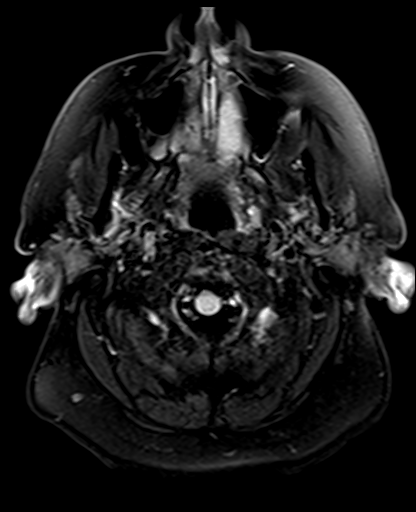
[im 27/27]
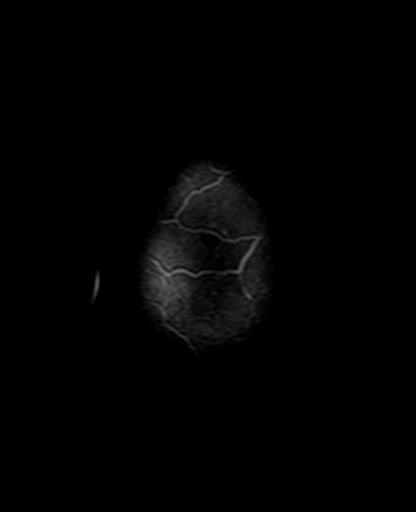

[Series 22: pha_images · axial · 3.0mm · 0.90mm/px · z∈[-57,+118]mm · 5 of 60 slices shown]
[im 1/60]
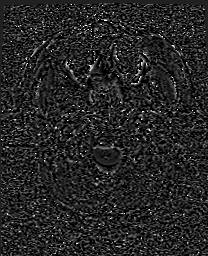
[im 15/60]
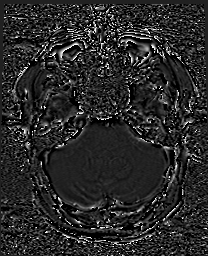
[im 30/60]
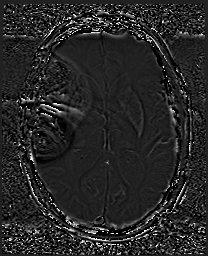
[im 45/60]
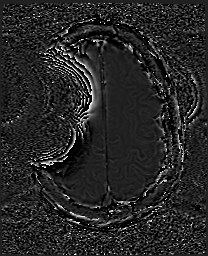
[im 60/60]
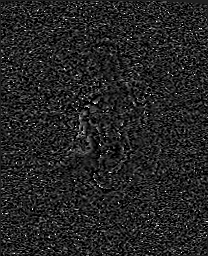

[Series 23: swi_images · axial · 3.0mm · 0.90mm/px · z∈[-57,+118]mm · 5 of 60 slices shown]
[im 1/60]
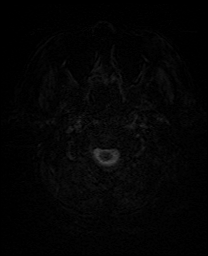
[im 15/60]
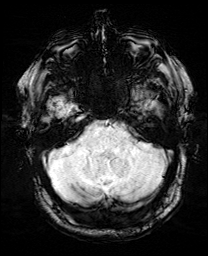
[im 30/60]
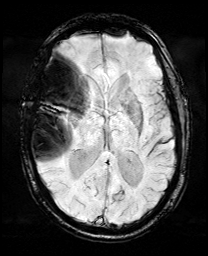
[im 45/60]
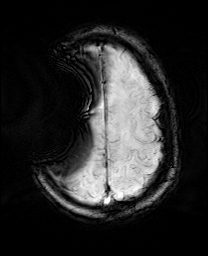
[im 60/60]
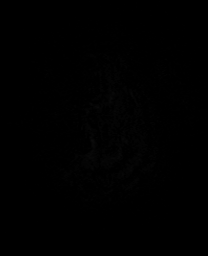

[Series 25: T2 · coronal · 5.0mm · 0.34mm/px · 3 of 29 slices shown (2 of 2)]
[im 1/29]
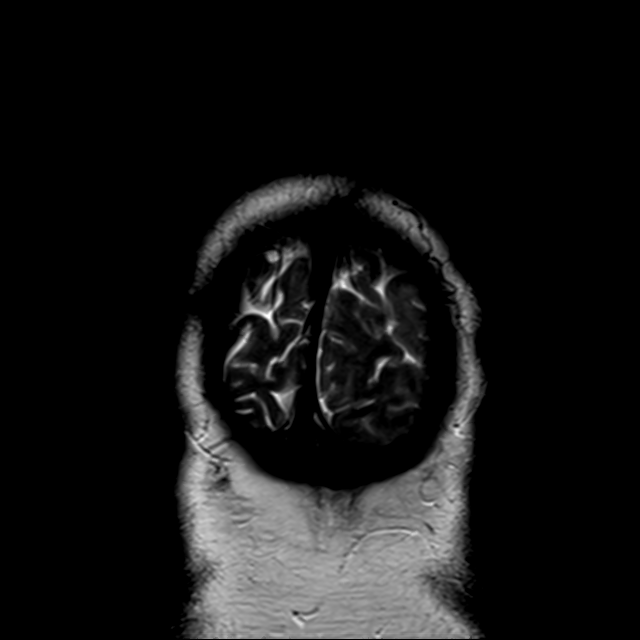
[im 15/29]
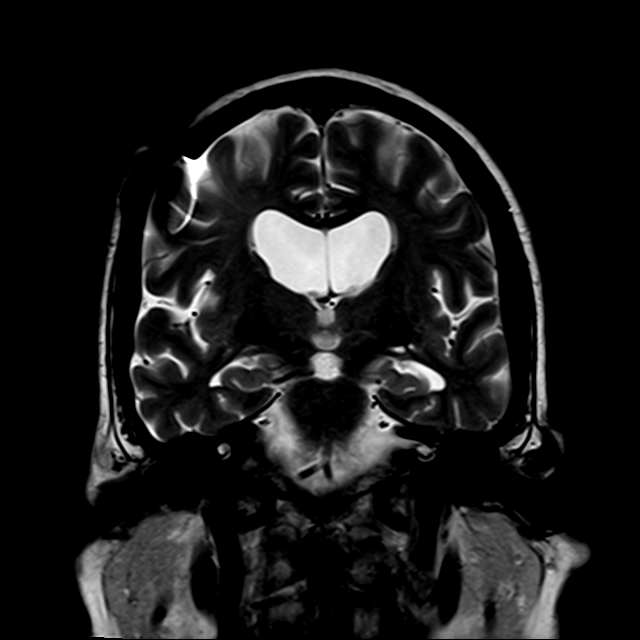
[im 29/29]
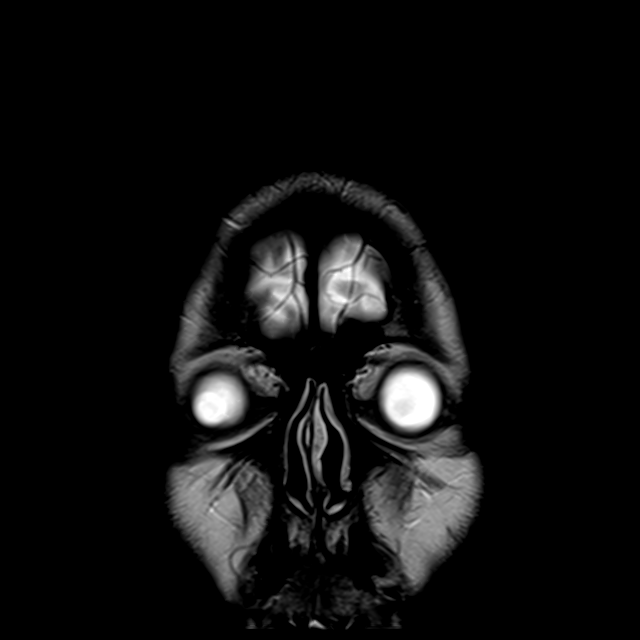

[43 of 48 positions shown; findings below may reference images not displayed]

FINDINGS: Brain: Susceptibility artifact related to a right-sided shunt
catheter, mildly limiting assessment.

Generalized age-related cerebral atrophy. No significant cerebral
white matter disease for age. No abnormal foci of restricted
diffusion to suggest acute or subacute ischemia. Gray-white matter
differentiation maintained. No encephalomalacia to suggest chronic
cortical infarction. No evidence for acute or chronic intracranial
hemorrhage.

Right frontal approach shunt catheter in place with tip terminating
near the foramen of ACDIEL. Stable ventricular size without
hydrocephalus or transependymal flow of CSF. No mass lesion, midline
shift, or mass effect. No extra-axial fluid collection. Pituitary
gland suprasellar region within normal limits. Midline structures
intact.

Vascular: Poorly defined flow void within the proximal left V4
segment related to underlying atherosclerotic disease, better seen
on prior CTA. Major intracranial vascular flow voids otherwise
maintained.

Skull and upper cervical spine: Craniocervical junction within
normal limits. Bone marrow signal intensity normal. Susceptibility
artifact at the right frontal scalp related to a VP shunt catheter.
Scalp soft tissues demonstrate no acute finding.

Sinuses/Orbits: Globes and orbital soft tissues demonstrate no acute
finding. Patient status post bilateral ocular lens replacement.
Paranasal sinuses are largely clear. Trace bilateral mastoid
effusions, of doubtful significance. Inner ear structures grossly
normal.

Other: None.
IMPRESSION: 1. No acute intracranial abnormality.
2. Right frontal approach shunt catheter in place, with stable size
and configuration of the ventricular system. No hydrocephalus.

## 2019-08-12 MED ORDER — LEVETIRACETAM 500 MG PO TABS: 1000.0000 mg | ORAL_TABLET | Freq: Two times a day (BID) | ORAL | 4 refills | Status: DC

## 2019-08-12 MED ORDER — DIAZEPAM 5 MG PO TABS
5.0000 mg | ORAL_TABLET | Freq: Four times a day (QID) | ORAL | 1 refills | Status: DC | PRN
Start: 2019-08-12 — End: 2019-08-17

## 2019-08-12 NOTE — Progress Notes (Addendum)
PATIENT: [name] Frank. DOB: [birth year]  REASON FOR VISIT: follow up HISTORY FROM: patient  HISTORY OF PRESENT ILLNESS: Today 08/12/19  HISTORY  [name] Frank. is a 69 year old male, seen in request by his primary care physician Dr. Lamar Blinks for evaluation of possible seizure, history of subdural hematoma, initial evaluation was on October 09, 2018.  I have reviewed and summarized the referring note from the referring physician.  He had a past medical history of hypertension, type 2 diabetes, coronary artery disease, paroxysmal atrial fibrillation, he had an unwitnessed fall at the bottom of the stairs, was brought to the emergency room on Jul 14, 2018, I personally reviewed CT head, acute subdural hematoma along the left cerebral convexity measuring up to 11 mm in thickness, patchy subarachnoid hemorrhage with a traumatic pattern, negative for cervical spine fracture.  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  He denies gait abnormality, memory loss at that time.  He readmitted to the hospital on Jul 28, 2018 for more somnolent, slept all day on May 16 sudden onset garbled speech, right facial droop, transient confusion, emergency room blood pressure 90/53, CT head showed developing community hydrocephalus, likely due to recent subarachnoid and intraventricular hemorrhage, no surgical intervention was indicated per neurosurgery evaluation, suspicious for complex partial seizure, he was discharged with Keppra 500 mg twice a day  Since recent hospital admission, there was no clinical seizure, but he was noted significant memory loss, he states, repeating questions, slept a  lot, lost control of his urine, also has mild gait abnormality,  He is always a slow walker, he tends to wait on him, by reviewing the initial CT scan on Jul 14, 2018, there is already some evidence of hydrocephalus, enlarged bilateral lateral ventricle,  I personally reviewed MRI of the brain on Jul 29, 2018, persistent ventriculomegaly, suspected developing communicating hydrocephalus, no evidence of acute stroke, 10 mm left subdural hematoma, with no mass-effect on the hemisphere,  Laboratory evaluations showed normal ESR, C-reactive protein, ANA, HIV, RPR, B12, A1c was 7.2, hemoglobin was 12.8,   Update November 19, 2018: He continues to have significant memory loss, gait abnormality, worsening urinary incontinence, this is all new since May 2020, he retired as an Engineer, 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[employer], later worked as a Microbiologist, just finished a complicated project in April 2020, now he has difficulty operating coffee machine, difficulty initiating his gait, fell few times,  We have personally reviewed MRI CT scans, compared with multiple scans he had a since May 2020, most recent MRI brain November 15, 2018: Resolution of left subdural hematoma, ventriculomegaly out of proportion to the extent of mild generalized cortical atrophy, additionally there appears to be mild transependymal flow, 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 Zada Finders, will see him on September 9  He is tolerating Keppra 500 mg twice daily well, there was no recurrent seizure   He has tried Sinemet 25/100 mg 3 times daily, there was no significant improvement  noted  UPDATE Oct 6th 2020: I reviewed operation record by Dr. Zada Finders, he had shunt replacement December 02, 2018.  He was seen by  physical therapy on December 03, 2018, the day postsurgical, patient with significant posterior lean, poor trunk control, required bilateral upper extremity support,  needs assistance, he reported overall 60% improvement in his walking, urinary frequency and urgency has much improved, still has mild memory loss  I reviewed CT head December 03, 2018: Operative changes from interval right bur hole craniotomy for VP shunt placement,  UPDATE Mar 17 2019: I personally reviewed CT head on Mar 01 2020:  Slight further decrease in lateral ventricle size since October. Stable right superior frontal approach shunt. No adverse features.  He is overall doing well, wife reported that he is back to his baseline, he is sedentary, is no longer taking Keppra 500 mg twice daily, we went over the initial event in May 2020, when he had unwitnessed fall at the ground level, but with resulting subdural hematoma, he had no recollection of the fall, possibility including seizure, after discussion, we decided to proceed with Keppra 500 mg twice a day  Lab in Dec 2020: LDL 111, CMP, glucose 299, creat 0.78, A1C 9.5  Update August 12, 2019 SS: He presented to the ER yesterday 08/11/19 with aphasia, word finding difficulties, left-sided facial droop since 5 PM when his wife came home, she reported confusion the past 2 days.  Of recent, had not been taking Keppra, was given Keppra 500 mg tablet.  MRI was negative, discharged home with his wife, still having some word finding difficulty but was alert and oriented. Urine shows > 500 glucose, glucose 302.  MRI of the brain 08/12/19 IMPRESSION: 1. No acute intracranial abnormality. 2. 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 No hydrocephalus.  CT head: 1. Examination limited by motion artifact greatest at the level of the superior frontal lobes. 2. No definite evidence of acute intracranial abnormality. 3.  Stable position of a right frontoparietal approach ventricular catheter. Stable size and configuration of the ventricular system as compared to prior CT 03/02/2019. 4. Mild ethmoid sinus mucosal thickening.  CTA neck: 1. The bilateral common and internal carotid arteries are patent within the neck without significant stenosis. Mild atherosclerotic plaque within the carotid systems as described. 2. 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 There is reconstitution of irregular flow within the V3 and proximal V4 left vertebral artery, which may be secondary to retrograde flow. As before, there are multifocal high-grade stenoses within the V3 and proximal V4 left vertebral artery. 3. The right vertebral artery is patent throughout the neck without significant stenosis.  CTA head: 1. Reconstitution of enhancement within the intracranial left vertebral artery as described, which may be due to retrograde flow. Redemonstrated multifocal high-grade stenoses within the V3 and proximal V4 left vertebral artery. The left PICA is patent proximally. 2. Redemonstrated moderate/severe stenosis within the proximal P2 left posterior cerebral artery. 3. No other intracranial high-grade proximal arterial stenosis is Identified.  Here today with his wife, for the last 48 hours, he has had significant decline, slurred speech, right-sided facial droop, difficulty following commands, trouble with walking.  Today, he had episode of urinary incontinence in exam room.  Today, poured coffee on his [place] toast instead of syrup, getting words confused, changing the letter of the first word, his wife last saw him normal on  Sunday night, apparently had meeting with financial planner Monday morning, assumes he was normal.  Has been doing a lot of hand gestures.  He manages his medications, his wife thinks he has been taking Keppra, in hospital, did not  recognize the name Keppra, refers to it as generic.   REVIEW OF SYSTEMS: Out of a complete 14 system review of symptoms, the patient complains only of the following symptoms, and all other reviewed systems are negative.  Slurred speech, walking difficulty, seizure  ALLERGIES: No Known Allergies  HOME MEDICATIONS: Outpatient Medications Prior to Visit  Medication Sig Dispense Refill  . acetaminophen (TYLENOL) 325 MG tablet Take 2 tablets (650 mg total) by mouth every 6 (six) hours as needed for mild pain.    Marland Kitchen atorvastatin (LIPITOR) 80 MG tablet Take 1 tablet (80 mg total) by mouth daily. (Patient taking differently: Take 80 mg by mouth every evening. ) 90 tablet 3  . glipiZIDE (GLUCOTROL XL) 10 MG 24 hr tablet Take 1 tablet (10 mg total) by mouth 2 (two) times daily. 180 tablet 3  . levETIRAcetam (KEPPRA) 500 MG tablet Take 1 tablet (500 mg total) by mouth 2 (two) times daily. 180 tablet 4  . metFORMIN (GLUCOPHAGE) 500 MG tablet Take 1 tablet (500 mg total) by mouth 2 (two) times daily with a meal. 180 tablet 3  . metoprolol succinate (TOPROL-XL) 50 MG 24 hr tablet Take 1 tablet (50 mg total) by mouth daily. Take with or immediately following a meal. 90 tablet 3  . tamsulosin (FLOMAX) 0.4 MG CAPS capsule Take 1 capsule (0.4 mg total) by mouth daily after supper. 30 capsule 1   No facility-administered medications prior to visit.    PAST MEDICAL HISTORY: Past Medical History:  Diagnosis Date  . Abnormality of gait    uses walker and wheelchair  . Cognitive deficit as late effect of traumatic brain injury ([place])   . Coronary artery disease    cardiologist-- dr s. Jake Bathe---  04-06-2014  NSTEMI  s/p  cardiac cath DES x1 to LAD and POBA to D1  . Dysrhythmia   . History of non-ST elevation myocardial infarction (NSTEMI)    04-06-2013  s/p  coronary stent and PCI  . Hydrocele, bilateral   . Hyperlipemia   . Hypertension   . Lower urinary tract symptoms (LUTS)   . Memory deficit   .  PAF (paroxysmal atrial fibrillation) ([place]) cardilogist-- dr Jake Bathe   on-set 05/ 2020 during admission for [facility],  w/ RVR,  with cardizem/ toprol, back in NSR,  f/u by cardiologist , event monitor 09-16-2018 no results in epic by per pt wife was told result ok with no afib  . S/P drug eluting coronary stent placement    04-06-2014  _0   DES x1 to LAD and POBA to D1  . SAH (subarachnoid hemorrhage) [facility]) neurologist-  dr Krista Blue--- last head CT 10-01-2018 resolving (10-23-2018  residual deficits memory issues, abnormal gait, congnitive   w/ SDH due to unwitnessed fall, traumatic head injury---- admission 07-14-2018 , d/c'd 08-01-2018,  readmitted 3 days later with late TBI effects,  d/c'd from rehab 08-22-2018  . Seizure disorder [facility])    followed by dr Krista Blue  . Subdural hematoma ([place])   . Type 2 diabetes mellitus ([place])    followed by pcp  . 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 CATARACT EXTRACTION W/ INTRAOCULAR LENS  IMPLANT,  BILATERAL  2008; 2009  . CORONARY ANGIOPLASTY WITH STENT PLACEMENT  04-06-2014  _0    DES x1 to LAD and POBA to D1  . HYDROCELE EXCISION Bilateral 10/27/2018   Procedure: HYDROCELECTOMY ADULT;  Surgeon: [name], MD;  Location: [facility];  Service: Urology;  Laterality: Bilateral;  . TONSILLECTOMY  child  . 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   Procedure: Right ventriculoperitoneal shunt placement;  Surgeon: Judith Part, MD;  Location: [place];  Service: Neurosurgery;  Laterality: Right;    FAMILY HISTORY: Family History  Problem Relation Age of Onset  . Stroke Father   . Hypertension Father   . Hyperlipidemia Father   . Early death Mother   . Colitis Sister   . Appendicitis Brother     SOCIAL HISTORY: Social History   Socioeconomic History  . Marital status: Married    Spouse name: Not on file  . Number of children: 2  . Years of  education: college  . Highest education level: Master's degree (e.g., MA, MS, MEng, MEd, MSW, MBA)  Occupational History  . Occupation: Retired  Tobacco Use  . Smoking status: Never Smoker  . Smokeless tobacco: Never Used  Substance and Sexual Activity  . Alcohol use: Not Currently    Comment: VERY RARELY  . Drug use: Never  . Sexual activity: Not on file  Other Topics Concern  . Not on file  Social History Narrative   Lives at home with his wife.   Right-handed.   Limited caffeine use.   Social Determinants of Health   Financial Resource Strain:   . Difficulty of Paying Living Expenses:   Food Insecurity:   . Worried About Charity fundraiser in the Last Year:   . Arboriculturist in the Last Year:   Transportation Needs:   . Film/video editor (Medical):   Marland Kitchen Lack of Transportation (Non-Medical):   Physical Activity:   . Days of Exercise per Week:   . Minutes of Exercise per Session:   Stress:   . Feeling of Stress :   Social Connections:   . Frequency of Communication with Friends and Family:   . Frequency of Social Gatherings with Friends and Family:   . Attends Religious Services:   . Active Member of Clubs or Organizations:   . Attends Archivist Meetings:   Marland Kitchen Marital Status:   Intimate Partner Violence:   . Fear of Current or Ex-Partner:   . Emotionally Abused:   Marland Kitchen Physically Abused:   . Sexually Abused:    PHYSICAL EXAM  Vitals:   08/12/19 1529  BP: 116/69  Pulse: 69  Weight: 223 lb (101.2 kg)  Height: _1  (1.778 m)   Body mass index is 32 kg/m.  Generalized: Well developed, in no acute distress   Neurological examination  Mentation: Alert, disoriented, aphasic, slurred speech, difficulty following commands, gesturing with hands Cranial nerve II-XII: Pupils were equal round reactive to light. Extraocular movements were full, visual field were full on confrontational test. Mild drooping left eyelid, right facial droop at the mouth  . Motor: No significant weakness noted to the arms or legs.  Sensory: Sensory testing is intact to soft touch on all 4 extremities. No evidence of extinction is noted.  Coordination: Unable to follow exam commands Gait and station: Wide-based, slow, unsteady  DIAGNOSTIC DATA (LABS, IMAGING, TESTING) - I reviewed patient records, labs, notes, testing and imaging myself where available.  Lab Results  Component Value  Date   WBC 4.9 08/11/2019   HGB 15.7 08/11/2019   HCT 46.5 08/11/2019   MCV 87.2 08/11/2019   PLT 146 (L) 08/11/2019      Component Value Date/Time   NA 135 08/11/2019 1922   K 4.4 08/11/2019 1922   CL 102 08/11/2019 1922   CO2 22 08/11/2019 1922   GLUCOSE 302 (H) 08/11/2019 1922   BUN 18 08/11/2019 1922   CREATININE 0.85 08/11/2019 1922   CALCIUM 9.0 08/11/2019 1922   PROT 6.3 (L) 08/11/2019 1922   ALBUMIN 3.7 08/11/2019 1922   AST 19 08/11/2019 1922   ALT 26 08/11/2019 1922   ALKPHOS 59 08/11/2019 1922   BILITOT 0.8 08/11/2019 1922   GFRNONAA >60 08/11/2019 1922   GFRAA >60 08/11/2019 1922   Lab Results  Component Value Date   CHOL 171 02/19/2019   HDL 34.50 (L) 02/19/2019   LDLCALC 111 (H) 02/19/2019   TRIG 128.0 02/19/2019   CHOLHDL 5 02/19/2019   Lab Results  Component Value Date   HGBA1C 9.5 (H) 02/19/2019   Lab Results  Component Value Date   VITAMINB12 264 10/09/2018   Lab Results  Component Value Date   TSH 5.115 (H) 07/18/2018    ASSESSMENT AND PLAN 69 y.o. year old male  has a past medical history of Abnormality of gait, Cognitive deficit as late effect of traumatic brain injury ([place]), Coronary artery disease, Dysrhythmia, History of non-ST elevation myocardial infarction (NSTEMI), Hydrocele, bilateral, Hyperlipemia, Hypertension, Lower urinary tract symptoms (LUTS), Memory deficit, PAF (paroxysmal atrial fibrillation) ([place]) (cardilogist-- dr Jake Bathe), S/P drug eluting coronary stent placement, SAH (subarachnoid hemorrhage) ([place])  (neurologist-  dr Krista Blue--- last head CT 10-01-2018 resolving (10-23-2018  residual deficits memory issues, abnormal gait, congnitive), Seizure disorder ([place]), Subdural hematoma ([place]), Type 2 diabetes mellitus ([place]), and Urinary incontinence. here with:  1. History of fall of unknown reason on Jul 14, 2018, with left convexity subdural hematoma, subarachnoid hemorrhage  2. Evidence of normal pressure hydrocephalus,  3. Status post VP shunt placement on December 02, 2018  4. 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 Krista Blue evaluated patient today, felt to be partial status epilepticus, was taken for stat EEG, EEG confirmed this-IV was placed, given 1 g IV Depacon, improvement in speech/function about 30 minutes after Depacon  -Load with Keppra 2000 mg just PM dose tonight  -Tomorrow start Keppra 1000 mg twice a day (was taking 500 mg BID)  -Check TSH, A1c, Keppra level today, also random glucose level-in ER glucose 300's, > 500 urine  -Given rx for Valium 5 mg to keep on hand for episodes of confusion, thought  to be partial seizures  -If confusion episodes continue tomorrow go back to ER, stay hydrated over next few days  -Follow-up in 1 week in office with Dr. Krista Blue  I spent 30 minutes of face-to-face and non-face-to-face time with patient.  This included previsit chart review, lab review, study review, order entry, electronic health record documentation, patient education.  Butler Denmark, AGNP-C, DNP 08/12/2019, 3:58 PM [facility] [address], [place], [place] [address] [phone]  Addendum, I reviewed cardiology visit on August 28, 2019 by Dr. Kathreen Devoid [place], coronary artery disease involving native coronary artery without angina pectoris, hypertension, paroxysmal atrial fibrillation, uncontrolled diabetes with microalbuminemia, mixed hyperlipidemia,  Dr. Marijo File think patient is doing quite well  from cardiac standpoint, he has been off aspirin since summer of 2020, he  is wondering if patient can go back on aspirin 81 mg daily  I reviewed most recent MRI of brain August 12, 2019, no acute intracranial abnormality, right frontal approach shunt catheter in place, no hydrocephalus, he should be able to resume aspirin 81 mg daily

## 2019-08-12 NOTE — Patient Instructions (Addendum)
Increase Keppra 1000 mg twice daily  Tonight take 2000 mg at bedtime  Check blood work today Go to the ER tomorrow if still confused, jerking  Keep [place] posted how he is doing,  See you back in 1 week with Dr. Terrace Arabia Stay hydrated

## 2019-08-12 NOTE — Discharge Instructions (Signed)
Please call Dr. Terrace Arabia for follow up Return to the ER for any worsening symptoms

## 2019-08-13 ENCOUNTER — Inpatient Hospital Stay (HOSPITAL_COMMUNITY)
Admission: EM | Admit: 2019-08-13 | Discharge: 2019-08-17 | DRG: 101 | Disposition: A | Discharge status: Home Health | Payer: Medicare HMO | Attending: Internal Medicine | Admitting: Internal Medicine

## 2019-08-13 ENCOUNTER — Telehealth: Payer: Self-pay | Admitting: Neurology

## 2019-08-13 ENCOUNTER — Encounter (HOSPITAL_COMMUNITY): Payer: Self-pay

## 2019-08-13 ENCOUNTER — Emergency Department (HOSPITAL_COMMUNITY): Payer: Medicare HMO

## 2019-08-13 DIAGNOSIS — R4189 Other symptoms and signs involving cognitive functions and awareness: Secondary | ICD-10-CM | POA: Diagnosis present

## 2019-08-13 DIAGNOSIS — W19XXXS Unspecified fall, sequela: Secondary | ICD-10-CM | POA: Diagnosis present

## 2019-08-13 DIAGNOSIS — S066X9S Traumatic subarachnoid hemorrhage with loss of consciousness of unspecified duration, sequela: Secondary | ICD-10-CM

## 2019-08-13 DIAGNOSIS — Z8249 Family history of ischemic heart disease and other diseases of the circulatory system: Secondary | ICD-10-CM

## 2019-08-13 DIAGNOSIS — I10 Essential (primary) hypertension: Secondary | ICD-10-CM | POA: Diagnosis present

## 2019-08-13 DIAGNOSIS — Z982 Presence of cerebrospinal fluid drainage device: Secondary | ICD-10-CM

## 2019-08-13 DIAGNOSIS — I252 Old myocardial infarction: Secondary | ICD-10-CM

## 2019-08-13 DIAGNOSIS — Z955 Presence of coronary angioplasty implant and graft: Secondary | ICD-10-CM

## 2019-08-13 DIAGNOSIS — Z7984 Long term (current) use of oral hypoglycemic drugs: Secondary | ICD-10-CM | POA: Diagnosis not present

## 2019-08-13 DIAGNOSIS — Z823 Family history of stroke: Secondary | ICD-10-CM

## 2019-08-13 DIAGNOSIS — G40201 Localization-related (focal) (partial) symptomatic epilepsy and epileptic syndromes with complex partial seizures, not intractable, with status epilepticus: Principal | ICD-10-CM | POA: Diagnosis present

## 2019-08-13 DIAGNOSIS — G40901 Epilepsy, unspecified, not intractable, with status epilepticus: Secondary | ICD-10-CM | POA: Diagnosis not present

## 2019-08-13 DIAGNOSIS — I480 Paroxysmal atrial fibrillation: Secondary | ICD-10-CM | POA: Diagnosis present

## 2019-08-13 DIAGNOSIS — IMO0002 Reserved for concepts with insufficient information to code with codable children: Secondary | ICD-10-CM

## 2019-08-13 DIAGNOSIS — R29810 Facial weakness: Secondary | ICD-10-CM | POA: Diagnosis present

## 2019-08-13 DIAGNOSIS — E119 Type 2 diabetes mellitus without complications: Secondary | ICD-10-CM

## 2019-08-13 DIAGNOSIS — E1165 Type 2 diabetes mellitus with hyperglycemia: Secondary | ICD-10-CM

## 2019-08-13 DIAGNOSIS — R4702 Dysphasia: Secondary | ICD-10-CM | POA: Diagnosis present

## 2019-08-13 DIAGNOSIS — I2510 Atherosclerotic heart disease of native coronary artery without angina pectoris: Secondary | ICD-10-CM | POA: Diagnosis present

## 2019-08-13 DIAGNOSIS — R001 Bradycardia, unspecified: Secondary | ICD-10-CM | POA: Diagnosis not present

## 2019-08-13 DIAGNOSIS — Z83438 Family history of other disorder of lipoprotein metabolism and other lipidemia: Secondary | ICD-10-CM | POA: Diagnosis not present

## 2019-08-13 DIAGNOSIS — E785 Hyperlipidemia, unspecified: Secondary | ICD-10-CM | POA: Diagnosis present

## 2019-08-13 DIAGNOSIS — Z20822 Contact with and (suspected) exposure to covid-19: Secondary | ICD-10-CM | POA: Diagnosis not present

## 2019-08-13 DIAGNOSIS — R4182 Altered mental status, unspecified: Secondary | ICD-10-CM | POA: Diagnosis present

## 2019-08-13 DIAGNOSIS — R4701 Aphasia: Secondary | ICD-10-CM | POA: Diagnosis not present

## 2019-08-13 DIAGNOSIS — G40211 Localization-related (focal) (partial) symptomatic epilepsy and epileptic syndromes with complex partial seizures, intractable, with status epilepticus: Secondary | ICD-10-CM

## 2019-08-13 DIAGNOSIS — G912 (Idiopathic) normal pressure hydrocephalus: Secondary | ICD-10-CM | POA: Diagnosis not present

## 2019-08-13 DIAGNOSIS — G40909 Epilepsy, unspecified, not intractable, without status epilepticus: Secondary | ICD-10-CM

## 2019-08-13 DIAGNOSIS — Z03818 Encounter for observation for suspected exposure to other biological agents ruled out: Secondary | ICD-10-CM | POA: Diagnosis not present

## 2019-08-13 DIAGNOSIS — R569 Unspecified convulsions: Secondary | ICD-10-CM | POA: Diagnosis not present

## 2019-08-13 LAB — COMPREHENSIVE METABOLIC PANEL
ALT: 19 U/L (ref 0–44)
AST: 16 U/L (ref 15–41)
Albumin: 3.7 g/dL (ref 3.5–5.0)
Alkaline Phosphatase: 59 U/L (ref 38–126)
Anion gap: 14 (ref 5–15)
BUN: 18 mg/dL (ref 8–23)
CO2: 20 mmol/L — ABNORMAL LOW (ref 22–32)
Calcium: 8.9 mg/dL (ref 8.9–10.3)
Chloride: 102 mmol/L (ref 98–111)
Creatinine, Ser: 0.89 mg/dL (ref 0.61–1.24)
GFR calc Af Amer: >60 mL/min (ref >60)
GFR calc non Af Amer: >60 mL/min (ref >60)
Glucose, Bld: 247 mg/dL — ABNORMAL HIGH (ref 70–99)
Potassium: 4.9 mmol/L (ref 3.5–5.1)
Sodium: 136 mmol/L (ref 135–145)
Total Bilirubin: 1.2 mg/dL (ref 0.3–1.2)
Total Protein: 6.7 g/dL (ref 6.5–8.1)

## 2019-08-13 LAB — CBC
HCT: 46.7 % (ref 39.0–52.0)
Hemoglobin: 15.4 g/dL (ref 13.0–17.0)
MCH: 28.8 pg (ref 26.0–34.0)
MCHC: 33 g/dL (ref 30.0–36.0)
MCV: 87.5 fL (ref 80.0–100.0)
Platelets: 147 10*3/uL — ABNORMAL LOW (ref 150–400)
RBC: 5.34 MIL/uL (ref 4.22–5.81)
RDW: 13 % (ref 11.5–15.5)
WBC: 5.7 10*3/uL (ref 4.0–10.5)
nRBC: 0 % (ref 0.0–0.2)

## 2019-08-13 LAB — GLUCOSE, CAPILLARY: Glucose-Capillary: 122 mg/dL — ABNORMAL HIGH (ref 70–99)

## 2019-08-13 LAB — SARS CORONAVIRUS 2 BY RT PCR (HOSPITAL ORDER, PERFORMED IN ~~LOC~~ HOSPITAL LAB): SARS Coronavirus 2: NEGATIVE

## 2019-08-13 MED ORDER — INSULIN ASPART 100 UNIT/ML ~~LOC~~ SOLN
0.0000 [IU] | Freq: Three times a day (TID) | SUBCUTANEOUS | Status: DC
  Administered 2019-08-14: 3 [IU] via SUBCUTANEOUS
  Administered 2019-08-14: 2 [IU] via SUBCUTANEOUS
  Administered 2019-08-14: 5 [IU] via SUBCUTANEOUS
  Administered 2019-08-15: 3 [IU] via SUBCUTANEOUS
  Administered 2019-08-15: 5 [IU] via SUBCUTANEOUS
  Administered 2019-08-15: 3 [IU] via SUBCUTANEOUS
  Administered 2019-08-16: 5 [IU] via SUBCUTANEOUS
  Administered 2019-08-16: 8 [IU] via SUBCUTANEOUS
  Administered 2019-08-16: 2 [IU] via SUBCUTANEOUS
  Administered 2019-08-17: 5 [IU] via SUBCUTANEOUS
  Administered 2019-08-17: 3 [IU] via SUBCUTANEOUS

## 2019-08-13 MED ORDER — VALPROATE SODIUM 500 MG/5ML IV SOLN
2000.0000 mg | Freq: Once | INTRAVENOUS | Status: AC
  Administered 2019-08-13: 2000 mg via INTRAVENOUS
  Filled 2019-08-13: qty 20

## 2019-08-13 MED ORDER — TAMSULOSIN HCL 0.4 MG PO CAPS
0.4000 mg | ORAL_CAPSULE | Freq: Every day | ORAL | Status: DC
  Administered 2019-08-14 – 2019-08-16 (×3): 0.4 mg via ORAL
  Filled 2019-08-13 (×3): qty 1

## 2019-08-13 MED ORDER — VALPROATE SODIUM 500 MG/5ML IV SOLN
500.0000 mg | Freq: Three times a day (TID) | INTRAVENOUS | Status: DC
  Administered 2019-08-14 (×2): 500 mg via INTRAVENOUS
  Filled 2019-08-13 (×5): qty 5

## 2019-08-13 MED ORDER — ATORVASTATIN CALCIUM 80 MG PO TABS
80.0000 mg | ORAL_TABLET | Freq: Every evening | ORAL | Status: DC
  Administered 2019-08-14 – 2019-08-16 (×3): 80 mg via ORAL
  Filled 2019-08-13 (×3): qty 1

## 2019-08-13 MED ORDER — SODIUM CHLORIDE 0.9 % IV SOLN
75.0000 mL/h | INTRAVENOUS | Status: DC
  Administered 2019-08-13 – 2019-08-15 (×5): 75 mL/h via INTRAVENOUS

## 2019-08-13 MED ORDER — LEVETIRACETAM IN NACL 1000 MG/100ML IV SOLN
1000.0000 mg | Freq: Two times a day (BID) | INTRAVENOUS | Status: DC
  Administered 2019-08-13 – 2019-08-17 (×8): 1000 mg via INTRAVENOUS
  Filled 2019-08-13 (×7): qty 100

## 2019-08-13 MED ORDER — INSULIN ASPART 100 UNIT/ML ~~LOC~~ SOLN
0.0000 [IU] | Freq: Every day | SUBCUTANEOUS | Status: DC
  Administered 2019-08-15 – 2019-08-16 (×2): 2 [IU] via SUBCUTANEOUS

## 2019-08-13 NOTE — ED Provider Notes (Signed)
[facility] EMERGENCY DEPARTMENT Provider Note   CSN: [id number] Arrival date & time: 08/13/19  1356     History Chief Complaint  Patient presents with  . Seizures  . Altered Mental Status    Dyer Klug. is a 69 y.o. male.  HPI   Patient is a 69 year old male with a history of CAD, dysrhythmia, hyperlipidemia, hypertension, paroxysmal atrial fibrillation, subarachnoid hemorrhage, drug-eluting stent, seizures, subdural hematoma, diabetes, who presents to the emergency department today for evaluation of altered mental status.  Wife is at bedside and provides most of the history.  She states that over the last 3 to 4 days patient has had increasing cognitive decline.  It started out as difficulty with him finding words or switching around different sounds of words.  He has had progressive difficulty with his speech for the last several days and now is appearing confused.  He is not able to complete his ADLs such as using silverware or making simple meals.  She has not noticed any seizure-like activity.  She states that typically he has been able to manage his own seizure medications and she has not typically followed up and monitored that he has been taking his medication however she has been trying to administer medication as directed and did give Keppra last night per instructions from his neurologist.  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  He was given Depacon and had some improvement in symptoms.  He was also given 2000 mg of Keppra last night.  It was recommended that he start Keppra 1000 g twice daily starting today.  He was instructed to go to the ER if confusion continued today.  Additionally his imaging over the last several days was reviewed and he had an MRI yesterday with no acute intracranial abnormality.    Level 5 caveat secondary to  patients AMS  Past Medical History:  Diagnosis Date  . Abnormality of gait    uses walker and wheelchair  . Cognitive deficit as late effect of traumatic brain injury (HCC)   . Coronary artery disease    cardiologist-- dr s. Dickie La---  04-06-2014  NSTEMI  s/p  cardiac cath DES x1 to LAD and POBA to D1  . Dysrhythmia   . History of non-ST elevation myocardial infarction (NSTEMI)    04-06-2013  s/p  coronary stent and PCI  . Hydrocele, bilateral   . Hyperlipemia   . Hypertension   . Lower urinary tract symptoms (LUTS)   . Memory deficit   . PAF (paroxysmal atrial fibrillation) (HCC) cardilogist-- dr Dickie La   on-set 05/ 2020 during admission for [facility],  w/ RVR,  with cardizem/ toprol, back in NSR,  f/u by cardiologist , event monitor 09-16-2018 no results in epic by per pt wife was told result ok with no afib  . S/P drug eluting coronary stent placement    04-06-2014    DES x1 to LAD and POBA to D1  . SAH (subarachnoid hemorrhage) [facility]) neurologist-  dr Terrace Arabia--- last head CT 10-01-2018 resolving (10-23-2018  residual deficits memory issues, abnormal gait, congnitive   w/ SDH due to unwitnessed fall, traumatic head injury---- admission 07-14-2018 , d/c'd 08-01-2018,  readmitted 3 days later with late TBI effects,  d/c'd from rehab 08-22-2018  . Seizure disorder [facility])    followed by dr Terrace Arabia  . Subdural hematoma (HCC)   .  Type 2 diabetes mellitus (HCC)    followed by pcp  . Urinary incontinence    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    Patient Active Problem List   Diagnosis Date Noted  . Acute confusion 08/12/2019  . Unspecified abnormalities of gait and mobility 12/16/2018  . 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 Normal pressure hydrocephalus (HCC) 12/02/2018  . History of subdural hematoma 11/18/2018  . Urinary incontinence 11/18/2018  . Memory loss 10/09/2018  . Right hemiparesis (HCC) 09/04/2018  . Cognitive deficit as late effect of traumatic brain injury  (HCC) 09/04/2018  . Bradycardia   . Leukopenia   . Hypoalbuminemia due to protein-calorie malnutrition (HCC)   . Hydrocele   . Hydrocephalus (HCC) 07/28/2018  . Mild renal insufficiency 07/28/2018  . Paroxysmal atrial fibrillation (HCC) 07/28/2018  . Subdural hematoma (HCC) 07/14/2018  . Hypertension, essential 01/16/2018  . Controlled type 2 diabetes mellitus without complication, without long-term current use of insulin (HCC) 01/16/2018  . Coronary artery disease involving native heart without angina pectoris 01/16/2018    Past Surgical History:  Procedure Laterality Date  . CATARACT EXTRACTION W/ INTRAOCULAR LENS  IMPLANT, BILATERAL  2008; 2009  . CORONARY ANGIOPLASTY WITH STENT PLACEMENT  04-06-2014     DES x1 to LAD and POBA to D1  . HYDROCELE EXCISION Bilateral 10/27/2018   Procedure: HYDROCELECTOMY ADULT;  Surgeon: Ihor Gully, MD;  Location: [facility];  Service: Urology;  Laterality: Bilateral;  . TONSILLECTOMY  child  . 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   Procedure: Right ventriculoperitoneal shunt placement;  Surgeon: Jadene Pierini, MD;  Location: [facility] OR;  Service: Neurosurgery;  Laterality: Right;       Family History  Problem Relation Age of Onset  . Stroke Father   . Hypertension Father   . Hyperlipidemia Father   . Early death Mother   . Colitis Sister   . Appendicitis Brother     Social History   Tobacco Use  . Smoking status: Never Smoker  . Smokeless tobacco: Never Used  Substance Use Topics  . Alcohol use: Not Currently    Comment: VERY RARELY  . Drug use: Never    Home Medications Prior to Admission medications   Medication Sig Start Date End Date Taking? Authorizing Provider  atorvastatin (LIPITOR) 80 MG tablet Take 1 tablet (80 mg total) by mouth daily. Patient taking differently: Take 80 mg by mouth every evening.  02/19/19  Yes Copland, Gwenlyn Found, MD  diazepam (VALIUM) 5 MG tablet Take 1 tablet (5 mg  total) by mouth every 6 (six) hours as needed for anxiety (for confusion). 08/12/19  Yes Glean Salvo, NP  glipiZIDE (GLUCOTROL XL) 10 MG 24 hr tablet Take 1 tablet (10 mg total) by mouth 2 (two) times daily. 02/19/19  Yes Copland, Gwenlyn Found, MD  levETIRAcetam (KEPPRA) 500 MG tablet Take 2 tablets (1,000 mg total) by mouth 2 (two) times daily. Patient taking differently: Take 500 mg by mouth 2 (two) times daily.  08/12/19  Yes Glean Salvo, NP  metFORMIN (GLUCOPHAGE) 500 MG tablet Take 1 tablet (500 mg total) by mouth 2 (two) times daily with a meal. 12/18/18  Yes Copland, Gwenlyn Found, MD  metoprolol succinate (TOPROL-XL) 50 MG 24 hr tablet Take 1 tablet (50 mg total) by mouth daily. Take with or immediately following a meal. 02/19/19  Yes Copland, Gwenlyn Found, MD  tamsulosin (FLOMAX) 0.4 MG CAPS capsule Take 1 capsule (0.4 mg total) by mouth daily after  supper. 08/22/18  Yes Love, Evlyn KannerPamela S, PA-C  acetaminophen (TYLENOL) 325 MG tablet Take 2 tablets (650 mg total) by mouth every 6 (six) hours as needed for mild pain. Patient not taking: Reported on 08/13/2019 07/18/18   Juliet RudeJohnson, Kelly R, PA-C    Allergies    Patient has no known allergies.  Review of Systems   Review of Systems  Unable to perform ROS: Mental status change  Neurological:       Aphasia    Physical Exam Updated Vital Signs BP (!) 124/58   Pulse (!) 53   Temp 98.5 F (36.9 C) (Oral)   Resp 13   SpO2 97%   Physical Exam Vitals and nursing note reviewed.  Constitutional:      Appearance: He is well-developed.  HENT:     Head: Normocephalic and atraumatic.  Eyes:     Conjunctiva/sclera: Conjunctivae normal.  Cardiovascular:     Rate and Rhythm: Normal rate and regular rhythm.     Heart sounds: No murmur.  Pulmonary:     Effort: Pulmonary effort is normal. No respiratory distress.     Breath sounds: Normal breath sounds. No wheezing, rhonchi or rales.  Abdominal:     Palpations: Abdomen is soft.     Tenderness: There is  no abdominal tenderness.  Musculoskeletal:     Cervical back: Neck supple.  Skin:    General: Skin is warm and dry.  Neurological:     Mental Status: He is alert.     Comments: Mental Status:  Alert, intermittently aphasic. Able to follow some simple commands but not consistently.   Cranial Nerves:  II:   pupils equal, round, reactive to light III,IV, VI: left eye ptosis (chronic per wife), unable to test EOMs as pt cannot follow commands V,VII: right facial droop VIII: appears to have intact hearing X: uvula elevates symmetrically  XI: unable to test XII: midline tongue extension without fassiculations Motor:  Normal tone. 5/5 strength of BUE and BLE major muscle groups including strong and equal grip strength and dorsiflexion/plantar flexion Sensory: light touch normal in all extremities. DTRs: unable to test Cerebellar: unable to test Gait: not assessed      ED Results / Procedures / Treatments   Labs (all labs ordered are listed, but only abnormal results are displayed) Labs Reviewed  COMPREHENSIVE METABOLIC PANEL - Abnormal; Notable for the following components:      Result Value   CO2 20 (*)    Glucose, Bld 247 (*)    All other components within normal limits  CBC - Abnormal; Notable for the following components:   Platelets 147 (*)    All other components within normal limits  SARS CORONAVIRUS 2 BY RT PCR [facility])    EKG None  Radiology EEG  Result Date: 08/12/2019 Levert FeinsteinYan, Yijun, MD     08/13/2019  6:21 PM HISTORY: 69 year old male with history of left subdural hematoma, seizure, status post VP shunt, presented with sudden onset language difficulty, confusion since evening of August 17, 2019, TECHNIQUE: This is emergency 16 channel EEG recording for evaluation of sudden onset confusion, it was performed during wakefulness sitting position.  Hyperventilation and photic stimulation were not performed.  There was frequent  muscle and eye movement artifact during the recording. Posterior dominant waking rhythm consistent of rhythmic alpha range activity, at right hemisphere, there is apparent asymmetry, left side has diffuse higher amplitude, slower activity, frontal dominant.  In addition, there was  also frequent protrusion of rhythmic 2 Hz slowing involving left hemisphere, occasionally spreading to mirror lead at right frontal region.  The longest run of rhythmic slowing of left hemisphere lasting more than 2 pages, there was moderate improvement, less frequent appearance of left hemisphere rhythmic slowing following 1 g of Depacon IV infusion.  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 Levert Feinstein, M.D. Ph.D. [facility] [address] [place], [place] [address] Phone: [phone] Fax:      [phone]   CT Angio Head W/Cm &/Or Wo Cm  Result Date: 08/11/2019 CLINICAL DATA:  Altered mental status, unclear cause. Additional history provided: Expressive aphasia. NIH 1. EXAM: CT ANGIOGRAPHY HEAD AND NECK TECHNIQUE: Multidetector CT imaging of the head and neck was performed using the standard protocol during bolus administration of intravenous contrast. Multiplanar CT image reconstructions and MIPs were obtained to evaluate the vascular anatomy. Carotid stenosis measurements (when applicable) are obtained utilizing NASCET criteria, using the distal internal carotid diameter as the denominator. CONTRAST:  27mL OMNIPAQUE IOHEXOL 350 MG/ML SOLN COMPARISON:  Noncontrast head CT 03/02/2019, brain MRI 11/15/2018, CT angiogram head/neck 07/28/2018 FINDINGS: CT HEAD FINDINGS Brain: Examination limited by motion artifact greatest at the level of the superior frontal lobes. Stable position of a right frontoparietal approach  ventricular catheter which terminates within the right lateral ventricle. The ventricular system has not significantly changed in size or configuration as compared to prior head CT 03/02/2019. There is no acute intracranial hemorrhage. No definite demarcated cortical infarct is identified. No extra-axial fluid collection. No evidence of intracranial mass. No midline shift. Vascular: Reported below. Skull: Normal. Negative for fracture or focal lesion. Sinuses: Mild ethmoid sinus mucosal thickening. No significant mastoid effusion. Orbits: No acute abnormality. Review of the MIP images confirms the above findings CTA NECK FINDINGS Aortic arch: Atherosclerotic plaque within the visualized aortic arch and proximal major branch vessels of the neck. No hemodynamically significant innominate or proximal subclavian artery stenosis. Right carotid system: CCA and ICA patent within the neck without significant stenosis (50% or greater). Mild calcified plaque at the carotid bifurcation and within the proximal ICA. Left carotid system: CCA and ICA patent within the neck without significant stenosis (50% or greater). Mild mixed plaque within the carotid bifurcation and proximal ICA. Vertebral arteries: The right vertebral artery is patent throughout the neck without significant stenosis. 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 There is reconstitution of irregular flow within the V3 and proximal V4 left vertebral artery, which may be secondary to retrograde flow. As before, there are multifocal high-grade stenoses within the V3 and proximal V4 left vertebral artery. Skeleton: No acute bony abnormality or aggressive osseous lesion. Cervical spondylosis without high-grade bony spinal canal narrowing. Other neck: No neck mass or cervical lymphadenopathy. Upper chest: No consolidation within the imaged lung apices. Calcified granuloma within the right upper lobe. Review of  the MIP images confirms the above findings CTA HEAD FINDINGS Anterior circulation: The intracranial internal carotid arteries are patent without significant stenosis. The M1 middle cerebral arteries are patent without significant stenosis. No M2 proximal branch occlusion or high-grade proximal stenosis is identified. The anterior cerebral arteries are patent without high-grade proximal stenosis. No intracranial aneurysm is identified. Posterior circulation: As described, there is reconstitution of regular flow within the V3 and proximal V4  left vertebral artery, possibly due to retrograde flow. Sites of high-grade stenosis within the V3 and proximal V4 left vertebral artery. Flow is demonstrated within the left PICA proximally. The intracranial right vertebral artery is patent without significant stenosis, as is the basilar artery. Predominantly fetal origin of the posterior predominantly fetal origin of the left posterior cerebral artery. The posterior cerebral arteries are patent bilaterally. As before, there is a moderate/severe stenosis within the proximal P2 left posterior cerebral artery (series 15, image 56). The right posterior communicating artery is hypoplastic or absent. Venous sinuses: Within limitations of contrast timing, no convincing thrombus. Anatomic variants: As described Review of the MIP images confirms the above findings IMPRESSION: CT head: 1. Examination limited by motion artifact greatest at the level of the superior frontal lobes. 2. No definite evidence of acute intracranial abnormality. 3. Stable position of a right frontoparietal approach ventricular catheter. Stable size and configuration of the ventricular system as compared to prior CT 03/02/2019. 4. Mild ethmoid sinus mucosal thickening. CTA neck: 1. The bilateral common and internal carotid arteries are patent within the neck without significant stenosis. Mild atherosclerotic plaque within the carotid systems as described. 2.  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 There is reconstitution of irregular flow within the V3 and proximal V4 left vertebral artery, which may be secondary to retrograde flow. As before, there are multifocal high-grade stenoses within the V3 and proximal V4 left vertebral artery. 3. The right vertebral artery is patent throughout the neck without significant stenosis. CTA head: 1. Reconstitution of enhancement within the intracranial left vertebral artery as described, which may be due to retrograde flow. Redemonstrated multifocal high-grade stenoses within the V3 and proximal V4 left vertebral artery. The left PICA is patent proximally. 2. Redemonstrated moderate/severe stenosis within the proximal P2 left posterior cerebral artery. 3. No other intracranial high-grade proximal arterial stenosis is identified. Electronically Signed   By: Jackey Loge DO   On: 08/11/2019 21:47   CT Angio Neck W and/or Wo Contrast  Result Date: 08/11/2019 CLINICAL DATA:  Altered mental status, unclear cause. Additional history provided: Expressive aphasia. NIH 1. EXAM: CT ANGIOGRAPHY HEAD AND NECK TECHNIQUE: Multidetector CT imaging of the head and neck was performed using the standard protocol during bolus administration of intravenous contrast. Multiplanar CT image reconstructions and MIPs were obtained to evaluate the vascular anatomy. Carotid stenosis measurements (when applicable) are obtained utilizing NASCET criteria, using the distal internal carotid diameter as the denominator. CONTRAST:  75mL OMNIPAQUE IOHEXOL 350 MG/ML SOLN COMPARISON:  Noncontrast head CT 03/02/2019, brain MRI 11/15/2018, CT angiogram head/neck 07/28/2018 FINDINGS: CT HEAD FINDINGS Brain: Examination limited by motion artifact greatest at the level of the superior frontal lobes. Stable position of a right frontoparietal approach ventricular catheter which terminates within the right lateral  ventricle. The ventricular system has not significantly changed in size or configuration as compared to prior head CT 03/02/2019. There is no acute intracranial hemorrhage. No definite demarcated cortical infarct is identified. No extra-axial fluid collection. No evidence of intracranial mass. No midline shift. Vascular: Reported below. Skull: Normal. Negative for fracture or focal lesion. Sinuses: Mild ethmoid sinus mucosal thickening. No significant mastoid effusion. Orbits: No acute abnormality. Review of the MIP images confirms the above findings CTA NECK FINDINGS Aortic arch: Atherosclerotic plaque within the visualized aortic arch and proximal major branch vessels of the neck. No hemodynamically significant innominate or proximal subclavian artery stenosis. Right carotid system: CCA and ICA  patent within the neck without significant stenosis (50% or greater). Mild calcified plaque at the carotid bifurcation and within the proximal ICA. Left carotid system: CCA and ICA patent within the neck without significant stenosis (50% or greater). Mild mixed plaque within the carotid bifurcation and proximal ICA. Vertebral arteries: The right vertebral artery is patent throughout the neck without significant stenosis. 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 There is reconstitution of irregular flow within the V3 and proximal V4 left vertebral artery, which may be secondary to retrograde flow. As before, there are multifocal high-grade stenoses within the V3 and proximal V4 left vertebral artery. Skeleton: No acute bony abnormality or aggressive osseous lesion. Cervical spondylosis without high-grade bony spinal canal narrowing. Other neck: No neck mass or cervical lymphadenopathy. Upper chest: No consolidation within the imaged lung apices. Calcified granuloma within the right upper lobe. Review of the MIP images confirms the above findings CTA HEAD FINDINGS  Anterior circulation: The intracranial internal carotid arteries are patent without significant stenosis. The M1 middle cerebral arteries are patent without significant stenosis. No M2 proximal branch occlusion or high-grade proximal stenosis is identified. The anterior cerebral arteries are patent without high-grade proximal stenosis. No intracranial aneurysm is identified. Posterior circulation: As described, there is reconstitution of regular flow within the V3 and proximal V4 left vertebral artery, possibly due to retrograde flow. Sites of high-grade stenosis within the V3 and proximal V4 left vertebral artery. Flow is demonstrated within the left PICA proximally. The intracranial right vertebral artery is patent without significant stenosis, as is the basilar artery. Predominantly fetal origin of the posterior predominantly fetal origin of the left posterior cerebral artery. The posterior cerebral arteries are patent bilaterally. As before, there is a moderate/severe stenosis within the proximal P2 left posterior cerebral artery (series 15, image 56). The right posterior communicating artery is hypoplastic or absent. Venous sinuses: Within limitations of contrast timing, no convincing thrombus. Anatomic variants: As described Review of the MIP images confirms the above findings IMPRESSION: CT head: 1. Examination limited by motion artifact greatest at the level of the superior frontal lobes. 2. No definite evidence of acute intracranial abnormality. 3. Stable position of a right frontoparietal approach ventricular catheter. Stable size and configuration of the ventricular system as compared to prior CT 03/02/2019. 4. Mild ethmoid sinus mucosal thickening. CTA neck: 1. The bilateral common and internal carotid arteries are patent within the neck without significant stenosis. Mild atherosclerotic plaque within the carotid systems as described. 2. Unchanged from prior examination 05/28/2018, the left vertebral  artery is occluded to the C1 level. There is reconstitution of irregular flow within the V3 and proximal V4 left vertebral artery, which may be secondary to retrograde flow. As before, there are multifocal high-grade stenoses within the V3 and proximal V4 left vertebral artery. 3. The right vertebral artery is patent throughout the neck without significant stenosis. CTA head: 1. Reconstitution of enhancement within the intracranial left vertebral artery as described, which may be due to retrograde flow. Redemonstrated multifocal high-grade stenoses within the V3 and proximal V4 left vertebral artery. The left PICA is patent proximally. 2. Redemonstrated moderate/severe stenosis within the proximal P2 left posterior cerebral artery. 3. No other intracranial high-grade proximal arterial stenosis is identified. Electronically Signed   By: Jackey Loge DO   On: 08/11/2019 21:47   MR BRAIN WO CONTRAST  Result Date: 08/12/2019 CLINICAL DATA:  Initial evaluation for acute expressive aphasia, altered mental status. EXAM:  MRI HEAD WITHOUT CONTRAST TECHNIQUE: Multiplanar, multiecho pulse sequences of the brain and surrounding structures were obtained without intravenous contrast. COMPARISON:  Prior CTs from 08/11/2019. FINDINGS: Brain: Susceptibility artifact related to a right-sided shunt catheter, mildly limiting assessment. Generalized age-related cerebral atrophy. No significant cerebral white matter disease for age. No abnormal foci of restricted diffusion to suggest acute or subacute ischemia. Gray-white matter differentiation maintained. No encephalomalacia to suggest chronic cortical infarction. No evidence for acute or chronic intracranial hemorrhage. Right frontal approach shunt catheter in place with tip terminating near the foramen of Monro. Stable ventricular size without hydrocephalus or transependymal flow of CSF. No mass lesion, midline shift, or mass effect. No extra-axial fluid collection. Pituitary gland  suprasellar region within normal limits. Midline structures intact. Vascular: Poorly defined flow void within the proximal left V4 segment related to underlying atherosclerotic disease, better seen on prior CTA. Major intracranial vascular flow voids otherwise maintained. Skull and upper cervical spine: Craniocervical junction within normal limits. Bone marrow signal intensity normal. Susceptibility artifact at the right frontal scalp related to a VP shunt catheter. Scalp soft tissues demonstrate no acute finding. Sinuses/Orbits: Globes and orbital soft tissues demonstrate no acute finding. Patient status post bilateral ocular lens replacement. Paranasal sinuses are largely clear. Trace bilateral mastoid effusions, of doubtful significance. Inner ear structures grossly normal. Other: None. IMPRESSION: 1. No acute intracranial abnormality. 2. 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 No hydrocephalus. Electronically Signed   By: Jeannine Boga M.D.   On: 08/12/2019 04:14   DG Chest Port 1 View  Result Date: 08/11/2019 CLINICAL DATA:  Altered mental status and chest pain EXAM: PORTABLE CHEST 1 VIEW COMPARISON:  07/18/2018 FINDINGS: Cardiac shadow is enlarged. Aortic calcifications are noted. Ventricular shunt is noted over the right chest wall. The lungs are clear. No bony abnormality is noted. IMPRESSION: No acute abnormality noted. Electronically Signed   By: Inez Catalina M.D.   On: 08/11/2019 22:25    Procedures Procedures (including critical care time)  CRITICAL CARE Performed by: Rodney Booze   Total critical care time: 31 minutes  Critical care time was exclusive of separately billable procedures and treating other patients.  Critical care was necessary to treat or prevent imminent or life-threatening deterioration.  Critical care was time spent personally by me on the following activities: development of treatment  plan with patient and/or surrogate as well as nursing, discussions with consultants, evaluation of patient's response to treatment, examination of patient, obtaining history from patient or surrogate, ordering and performing treatments and interventions, ordering and review of laboratory studies, ordering and review of radiographic studies, pulse oximetry and re-evaluation of patient's condition.   Medications Ordered in ED Medications  valproate (DEPACON) 2,000 mg in dextrose 5 % 50 mL IVPB (has no administration in time range)  valproate (DEPACON) 500 mg in dextrose 5 % 50 mL IVPB (has no administration in time range)  levETIRAcetam (KEPPRA) IVPB 1000 mg/100 mL premix (0 mg Intravenous Stopped 08/13/19 1737)    ED Course  I have reviewed the triage vital signs and the nursing notes.  Pertinent labs & imaging results that were available during my care of the patient were reviewed by me and considered in my medical decision making (see chart for details).    MDM Rules/Calculators/A&P                      69 year old male presenting for evaluation of altered  mental status that has progressed over the last several days back.  Has history of TBI and seizure disorder.  On multiple seizure medications.  Was seen by neurology yesterday and there was concern for status epilepticus therefore EEG was performed which did confirm this.  He was loaded with Depacon and given increased dose of Keppra last night.  Advised to come to the ED if symptoms continue today.  Wife noted today that he has continued to be all altered and symptoms are worsening.  Reviewed/interpreted labs CBC nonacute CMP with elevated blood glucose, slightly low bicarb, otherwise reassuring.  Normal blood glucose.  Neurology at bedside to evaluate the patient.  Recommendations per their consult note.  Patient will have LTM and will also be loaded with Keppra and Depakote.  Recommending admission to hospitalist service  Pt will  require admission for further monitoring and tx of partial status epilepticus  6:35 PM CONSULT with Dr. Cyndia Bent who accepts patient for admission   Final Clinical Impression(s) / ED Diagnoses Final diagnoses:  Status epilepticus [facility])    Rx / DC Orders ED Discharge Orders    None       Rayne Du 08/13/19 1837    Mancel Bale, MD 08/13/19 2334

## 2019-08-13 NOTE — Progress Notes (Signed)
vLTM started  Neurology notified.  Event button tested.  Routine EEG done 08-12-19 at physicians office

## 2019-08-13 NOTE — ED Notes (Signed)
ED TO INPATIENT HANDOFF REPORT  ED Nurse Name and Phone #: [phone] Wendie SimmerMelanie F., RN  S Name/Age/Gender [name] Shutteronald G Direnzo Jr. 69 y.o. male Room/Bed: 039C/039C  Code Status   Code Status: Full Code  Home/SNF/Other Home Patient oriented to: place Is this baseline? 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 Pt is alert, oriented to self only, hard to follow commands. Denies any pain    Allergies No Known Allergies  Level of Care/Admitting Diagnosis ED Disposition    ED Disposition Condition Comment   Admit  [facility] [[id number]]  Level of Care: Progressive [102]  Admit to Progressive based on following criteria: NEUROLOGICAL AND NEUROSURGICAL complex patients with significant risk of instability, who do not meet ICU criteria, yet require close observation or frequent assessment (< / = every 2 - 4 hours) with medical / nursing intervention.  [facility] or [facility] if equivalent level of care is available:: No  Covid Evaluation: Asymptomatic Screening Protocol (No Symptoms)  Diagnosis: Complex partial status epilepticus [facility](HCC) [[id number]][[id number]]  Admitting Physician: Anselm JunglingU, CHING T [[id number]]  Attending Physician: Anselm JunglingU, CHING T [[id number]]  Estimated length of stay: past midnight tomorrow  Certification:: I certify this patient will need inpatient services for at least 2 midnights       B Medical/Surgery History Past Medical History:  Diagnosis Date  . Abnormality of gait    uses walker and wheelchair  . Cognitive deficit as late effect of traumatic brain injury (HCC)   . Coronary artery disease    cardiologist-- dr s. Dickie Larohrbak---  04-06-2014  NSTEMI  s/p  cardiac cath DES x1 to LAD and POBA to D1   . Dysrhythmia   . History of non-ST elevation myocardial infarction (NSTEMI)    04-06-2013  s/p  coronary stent and PCI  . Hydrocele, bilateral   . Hyperlipemia   . Hypertension   . Lower urinary tract symptoms (LUTS)   . Memory deficit   . PAF (paroxysmal atrial fibrillation) (HCC) cardilogist-- dr Dickie Larohrbak   on-set 05/ [facility],  w/ RVR,  with cardizem/ toprol, back in NSR,  f/u by cardiologist , event monitor 09-16-2018 no results in epic by per pt wife was told result ok with no afib  . S/P drug eluting coronary stent placement    04-06-2014  @[facility]   DES x1 to LAD and POBA to D1  . SAH (subarachnoid hemorrhage) [facility](HCC) neurologist-  dr Terrace Arabiayan--- last head CT 10-01-2018 resolving (10-23-2018  residual deficits memory issues, abnormal gait, congnitive   w/ SDH due to unwitnessed fall, traumatic head injury---- admission 07-14-2018 , d/c'd 08-01-2018,  readmitted 3 days later with late TBI effects,  d/c'd from rehab 08-22-2018  . Seizure disorder [facility](HCC)    followed by dr Terrace Arabiayan  . Subdural hematoma (HCC)   . Type 2 diabetes mellitus (HCC)    followed by pcp  . 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 CATARACT EXTRACTION W/ INTRAOCULAR LENS  IMPLANT, BILATERAL  2008; 2009  . CORONARY ANGIOPLASTY WITH STENT PLACEMENT  04-06-2014  @[facility]    DES x1 to LAD and POBA to D1  . HYDROCELE EXCISION  Bilateral 10/27/2018   Procedure: HYDROCELECTOMY ADULT;  Surgeon: [name], MD;  Location: [facility];  Service: Urology;  Laterality: Bilateral;  . TONSILLECTOMY  child  . 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   Procedure: Right ventriculoperitoneal shunt placement;  Surgeon: Judith Part, MD;  Location: [place];  Service: Neurosurgery;  Laterality: Right;     A IV Location/Drains/Wounds Patient Lines/Drains/Airways Status   Active Line/Drains/Airways    Name:   Placement date:   Placement  time:   Site:   Days:   Peripheral IV 08/13/19 Right Hand   08/13/19    1602    Hand   less than 1   Incision (Closed) 08/02/18 Head Posterior;Right;Mid   08/02/18    0900     376   Incision (Closed) 10/27/18 Perineum   10/27/18    0951     290   Incision (Closed) 12/02/18 Abdomen Right   12/02/18    1722     254   Incision (Closed) 12/02/18 Head Right   12/02/18    1722     254          Intake/Output Last 24 hours No intake or output data in the 24 hours ending 08/13/19 1959  Labs/Imaging Results for orders placed or performed during the hospital encounter of 08/13/19 (from the past 48 hour(s))  Comprehensive metabolic panel     Status: Abnormal   Collection Time: 08/13/19  2:10 PM  Result Value Ref Range   Sodium 136 135 - 145 mmol/L   Potassium 4.9 3.5 - 5.1 mmol/L   Chloride 102 98 - 111 mmol/L   CO2 20 (L) 22 - 32 mmol/L   Glucose, Bld 247 (H) 70 - 99 mg/dL    Comment: Glucose reference range applies only to samples taken after fasting for at least 8 hours.   BUN 18 8 - 23 mg/dL   Creatinine, Ser 0.89 0.61 - 1.24 mg/dL   Calcium 8.9 8.9 - 10.3 mg/dL   Total Protein 6.7 6.5 - 8.1 g/dL   Albumin 3.7 3.5 - 5.0 g/dL   AST 16 15 - 41 U/L   ALT 19 0 - 44 U/L   Alkaline Phosphatase 59 38 - 126 U/L   Total Bilirubin 1.2 0.3 - 1.2 mg/dL   GFR calc non Af Amer >60 >60 mL/min   GFR calc Af Amer >60 >60 mL/min   Anion gap 14 5 - 15    Comment: Performed at [facility], [place] [address] [place], [place] [address]  CBC     Status: Abnormal   Collection Time: 08/13/19  2:51 PM  Result Value Ref Range   WBC 5.7 4.0 - 10.5 K/uL   RBC 5.34 4.22 - 5.81 MIL/uL   Hemoglobin 15.4 13.0 - 17.0 g/dL   HCT 46.7 39.0 - 52.0 %   MCV 87.5 80.0 - 100.0 fL   MCH 28.8 26.0 - 34.0 pg   MCHC 33.0 30.0 - 36.0 g/dL   RDW 13.0 11.5 - 15.5 %   Platelets 147 (L) 150 - 400 K/uL   nRBC 0.0 0.0 - 0.2 %    Comment: Performed at [facility], [place] [address], [place], [place] [address]    EEG  Result Date: 08/12/2019 Marcial Pacas, MD     08/13/2019  6:21 PM HISTORY: 69 year old male with history of left subdural hematoma, seizure, status post VP shunt, presented with sudden onset language difficulty, confusion since evening of  August 17, 2019, TECHNIQUE: This is emergency 16 channel EEG recording for evaluation of sudden onset confusion, it was performed during wakefulness sitting position.  Hyperventilation and photic stimulation were not performed.  There was frequent muscle and eye movement artifact during the recording. Posterior dominant waking rhythm consistent of rhythmic alpha range activity, at right hemisphere, there is apparent asymmetry, left side has diffuse higher amplitude, slower activity, frontal dominant.  In addition, there was also frequent protrusion of rhythmic 2 Hz slowing involving left hemisphere, occasionally spreading to mirror lead at right frontal region.  The longest run of rhythmic slowing of left hemisphere lasting more than 2 pages, there was moderate improvement, less frequent appearance of left hemisphere rhythmic slowing following 1 g of Depacon IV infusion.  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 Levert Feinstein, M.D. Ph.D. [facility] [address] [place], [place] [address] Phone: [phone] Fax:      [phone]   CT Angio Head W/Cm &/Or Wo Cm  Result Date: 08/11/2019 CLINICAL DATA:  Altered mental status, unclear cause. Additional history provided: Expressive aphasia. NIH 1. EXAM: CT ANGIOGRAPHY HEAD AND NECK TECHNIQUE: Multidetector CT imaging of the head and neck was performed using the standard protocol during bolus administration of intravenous contrast. Multiplanar CT image reconstructions and MIPs were obtained to evaluate the vascular  anatomy. Carotid stenosis measurements (when applicable) are obtained utilizing NASCET criteria, using the distal internal carotid diameter as the denominator. CONTRAST:  75mL OMNIPAQUE IOHEXOL 350 MG/ML SOLN COMPARISON:  Noncontrast head CT 03/02/2019, brain MRI 11/15/2018, CT angiogram head/neck 07/28/2018 FINDINGS: CT HEAD FINDINGS Brain: Examination limited by motion artifact greatest at the level of the superior frontal lobes. Stable position of a right frontoparietal approach ventricular catheter which terminates within the right lateral ventricle. The ventricular system has not significantly changed in size or configuration as compared to prior head CT 03/02/2019. There is no acute intracranial hemorrhage. No definite demarcated cortical infarct is identified. No extra-axial fluid collection. No evidence of intracranial mass. No midline shift. Vascular: Reported below. Skull: Normal. Negative for fracture or focal lesion. Sinuses: Mild ethmoid sinus mucosal thickening. No significant mastoid effusion. Orbits: No acute abnormality. Review of the MIP images confirms the above findings CTA NECK FINDINGS Aortic arch: Atherosclerotic plaque within the visualized aortic arch and proximal major branch vessels of the neck. No hemodynamically significant innominate or proximal subclavian artery stenosis. Right carotid system: CCA and ICA patent within the neck without significant stenosis (50% or greater). Mild calcified plaque at the carotid bifurcation and within the proximal ICA. Left carotid system: CCA and ICA patent within the neck without significant stenosis (50% or greater). Mild mixed plaque within the carotid bifurcation and proximal ICA. Vertebral arteries: The right vertebral artery is patent throughout the neck without significant stenosis. 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 There is reconstitution of irregular flow within the V3 and  proximal V4 left vertebral artery, which may be secondary to retrograde flow. As before, there are multifocal high-grade stenoses within the V3 and proximal V4 left vertebral artery. Skeleton: No acute bony abnormality or aggressive osseous lesion. Cervical spondylosis without high-grade bony spinal canal narrowing. Other neck: No neck mass or cervical lymphadenopathy. Upper chest: No consolidation within the imaged lung apices. Calcified granuloma within the right upper lobe.  Review of the MIP images confirms the above findings CTA HEAD FINDINGS Anterior circulation: The intracranial internal carotid arteries are patent without significant stenosis. The M1 middle cerebral arteries are patent without significant stenosis. No M2 proximal branch occlusion or high-grade proximal stenosis is identified. The anterior cerebral arteries are patent without high-grade proximal stenosis. No intracranial aneurysm is identified. Posterior circulation: As described, there is reconstitution of regular flow within the V3 and proximal V4 left vertebral artery, possibly due to retrograde flow. Sites of high-grade stenosis within the V3 and proximal V4 left vertebral artery. Flow is demonstrated within the left PICA proximally. The intracranial right vertebral artery is patent without significant stenosis, as is the basilar artery. Predominantly fetal origin of the posterior predominantly fetal origin of the left posterior cerebral artery. The posterior cerebral arteries are patent bilaterally. As before, there is a moderate/severe stenosis within the proximal P2 left posterior cerebral artery (series 15, image 56). The right posterior communicating artery is hypoplastic or absent. Venous sinuses: Within limitations of contrast timing, no convincing thrombus. Anatomic variants: As described Review of the MIP images confirms the above findings IMPRESSION: CT head: 1. Examination limited by motion artifact greatest at the level of the  superior frontal lobes. 2. No definite evidence of acute intracranial abnormality. 3. Stable position of a right frontoparietal approach ventricular catheter. Stable size and configuration of the ventricular system as compared to prior CT 03/02/2019. 4. Mild ethmoid sinus mucosal thickening. CTA neck: 1. The bilateral common and internal carotid arteries are patent within the neck without significant stenosis. Mild atherosclerotic plaque within the carotid systems as described. 2. 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 There is reconstitution of irregular flow within the V3 and proximal V4 left vertebral artery, which may be secondary to retrograde flow. As before, there are multifocal high-grade stenoses within the V3 and proximal V4 left vertebral artery. 3. The right vertebral artery is patent throughout the neck without significant stenosis. CTA head: 1. Reconstitution of enhancement within the intracranial left vertebral artery as described, which may be due to retrograde flow. Redemonstrated multifocal high-grade stenoses within the V3 and proximal V4 left vertebral artery. The left PICA is patent proximally. 2. Redemonstrated moderate/severe stenosis within the proximal P2 left posterior cerebral artery. 3. No other intracranial high-grade proximal arterial stenosis is identified. Electronically Signed   By: Jackey Loge DO   On: 08/11/2019 21:47   CT Angio Neck W and/or Wo Contrast  Result Date: 08/11/2019 CLINICAL DATA:  Altered mental status, unclear cause. Additional history provided: Expressive aphasia. NIH 1. EXAM: CT ANGIOGRAPHY HEAD AND NECK TECHNIQUE: Multidetector CT imaging of the head and neck was performed using the standard protocol during bolus administration of intravenous contrast. Multiplanar CT image reconstructions and MIPs were obtained to evaluate the vascular anatomy. Carotid stenosis measurements (when applicable) are  obtained utilizing NASCET criteria, using the distal internal carotid diameter as the denominator. CONTRAST:  25mL OMNIPAQUE IOHEXOL 350 MG/ML SOLN COMPARISON:  Noncontrast head CT 03/02/2019, brain MRI 11/15/2018, CT angiogram head/neck 07/28/2018 FINDINGS: CT HEAD FINDINGS Brain: Examination limited by motion artifact greatest at the level of the superior frontal lobes. Stable position of a right frontoparietal approach ventricular catheter which terminates within the right lateral ventricle. The ventricular system has not significantly changed in size or configuration as compared to prior head CT 03/02/2019. There is no acute intracranial hemorrhage. No definite demarcated cortical infarct is identified. No extra-axial fluid collection. No  evidence of intracranial mass. No midline shift. Vascular: Reported below. Skull: Normal. Negative for fracture or focal lesion. Sinuses: Mild ethmoid sinus mucosal thickening. No significant mastoid effusion. Orbits: No acute abnormality. Review of the MIP images confirms the above findings CTA NECK FINDINGS Aortic arch: Atherosclerotic plaque within the visualized aortic arch and proximal major branch vessels of the neck. No hemodynamically significant innominate or proximal subclavian artery stenosis. Right carotid system: CCA and ICA patent within the neck without significant stenosis (50% or greater). Mild calcified plaque at the carotid bifurcation and within the proximal ICA. Left carotid system: CCA and ICA patent within the neck without significant stenosis (50% or greater). Mild mixed plaque within the carotid bifurcation and proximal ICA. Vertebral arteries: The right vertebral artery is patent throughout the neck without significant stenosis. 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 There is reconstitution of irregular flow within the V3 and proximal V4 left vertebral artery, which may be secondary to  retrograde flow. As before, there are multifocal high-grade stenoses within the V3 and proximal V4 left vertebral artery. Skeleton: No acute bony abnormality or aggressive osseous lesion. Cervical spondylosis without high-grade bony spinal canal narrowing. Other neck: No neck mass or cervical lymphadenopathy. Upper chest: No consolidation within the imaged lung apices. Calcified granuloma within the right upper lobe. Review of the MIP images confirms the above findings CTA HEAD FINDINGS Anterior circulation: The intracranial internal carotid arteries are patent without significant stenosis. The M1 middle cerebral arteries are patent without significant stenosis. No M2 proximal branch occlusion or high-grade proximal stenosis is identified. The anterior cerebral arteries are patent without high-grade proximal stenosis. No intracranial aneurysm is identified. Posterior circulation: As described, there is reconstitution of regular flow within the V3 and proximal V4 left vertebral artery, possibly due to retrograde flow. Sites of high-grade stenosis within the V3 and proximal V4 left vertebral artery. Flow is demonstrated within the left PICA proximally. The intracranial right vertebral artery is patent without significant stenosis, as is the basilar artery. Predominantly fetal origin of the posterior predominantly fetal origin of the left posterior cerebral artery. The posterior cerebral arteries are patent bilaterally. As before, there is a moderate/severe stenosis within the proximal P2 left posterior cerebral artery (series 15, image 56). The right posterior communicating artery is hypoplastic or absent. Venous sinuses: Within limitations of contrast timing, no convincing thrombus. Anatomic variants: As described Review of the MIP images confirms the above findings IMPRESSION: CT head: 1. Examination limited by motion artifact greatest at the level of the superior frontal lobes. 2. No definite evidence of acute  intracranial abnormality. 3. Stable position of a right frontoparietal approach ventricular catheter. Stable size and configuration of the ventricular system as compared to prior CT 03/02/2019. 4. Mild ethmoid sinus mucosal thickening. CTA neck: 1. The bilateral common and internal carotid arteries are patent within the neck without significant stenosis. Mild atherosclerotic plaque within the carotid systems as described. 2. 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 There is reconstitution of irregular flow within the V3 and proximal V4 left vertebral artery, which may be secondary to retrograde flow. As before, there are multifocal high-grade stenoses within the V3 and proximal V4 left vertebral artery. 3. The right vertebral artery is patent throughout the neck without significant stenosis. CTA head: 1. Reconstitution of enhancement within the intracranial left vertebral artery as described, which may be due to retrograde flow. Redemonstrated multifocal  high-grade stenoses within the V3 and proximal V4 left vertebral artery. The left PICA is patent proximally. 2. Redemonstrated moderate/severe stenosis within the proximal P2 left posterior cerebral artery. 3. No other intracranial high-grade proximal arterial stenosis is identified. Electronically Signed   By: Jackey Loge DO   On: 08/11/2019 21:47   MR BRAIN WO CONTRAST  Result Date: 08/12/2019 CLINICAL DATA:  Initial evaluation for acute expressive aphasia, altered mental status. EXAM: MRI HEAD WITHOUT CONTRAST TECHNIQUE: Multiplanar, multiecho pulse sequences of the brain and surrounding structures were obtained without intravenous contrast. COMPARISON:  Prior CTs from 08/11/2019. FINDINGS: Brain: Susceptibility artifact related to a right-sided shunt catheter, mildly limiting assessment. Generalized age-related cerebral atrophy. No significant cerebral white matter disease for age. No abnormal foci  of restricted diffusion to suggest acute or subacute ischemia. Gray-white matter differentiation maintained. No encephalomalacia to suggest chronic cortical infarction. No evidence for acute or chronic intracranial hemorrhage. Right frontal approach shunt catheter in place with tip terminating near the foramen of Monro. Stable ventricular size without hydrocephalus or transependymal flow of CSF. No mass lesion, midline shift, or mass effect. No extra-axial fluid collection. Pituitary gland suprasellar region within normal limits. Midline structures intact. Vascular: Poorly defined flow void within the proximal left V4 segment related to underlying atherosclerotic disease, better seen on prior CTA. Major intracranial vascular flow voids otherwise maintained. Skull and upper cervical spine: Craniocervical junction within normal limits. Bone marrow signal intensity normal. Susceptibility artifact at the right frontal scalp related to a VP shunt catheter. Scalp soft tissues demonstrate no acute finding. Sinuses/Orbits: Globes and orbital soft tissues demonstrate no acute finding. Patient status post bilateral ocular lens replacement. Paranasal sinuses are largely clear. Trace bilateral mastoid effusions, of doubtful significance. Inner ear structures grossly normal. Other: None. IMPRESSION: 1. No acute intracranial abnormality. 2. 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 No hydrocephalus. Electronically Signed   By: Rise Mu M.D.   On: 08/12/2019 04:14   DG Chest Port 1 View  Result Date: 08/11/2019 CLINICAL DATA:  Altered mental status and chest pain EXAM: PORTABLE CHEST 1 VIEW COMPARISON:  07/18/2018 FINDINGS: Cardiac shadow is enlarged. Aortic calcifications are noted. Ventricular shunt is noted over the right chest wall. The lungs are clear. No bony abnormality is noted. IMPRESSION: No acute abnormality noted. Electronically Signed    By: Alcide Clever M.D.   On: 08/11/2019 22:25    Pending Labs Unresulted Labs (From admission, onward)    Start     Ordered   08/14/19 0500  Comprehensive metabolic panel  Tomorrow morning,   R     08/13/19 1914   08/14/19 0500  CBC  Tomorrow morning,   R     08/13/19 1914   08/13/19 1757  SARS Coronavirus 2 by RT PCR (hospital order, performed in [facility] hospital lab) Nasopharyngeal Nasopharyngeal Swab  (Tier 2 (TAT 2 hrs))  Once,   STAT    Question Answer Comment  Is this test for diagnosis or screening Screening   Symptomatic for COVID-19 as defined by CDC No   Hospitalized for COVID-19 No   Admitted to ICU for COVID-19 No   Previously tested for COVID-19 Yes   Resident in a congregate (group) care setting Unknown   Employed in healthcare setting Unknown   Has patient completed COVID vaccination(s) (2 doses of Pfizer/Moderna 1 dose of [employer]) Unknown      08/13/19 1758  Vitals/Pain Today's Vitals   08/13/19 1800 08/13/19 1815 08/13/19 1830 08/13/19 1845  BP: 128/62 128/62 122/69 131/61  Pulse: (!) 51 (!) 50 (!) 49 (!) 51  Resp: (!) 27 (!) 22 (!) 26 (!) 28  Temp:      TempSrc:      SpO2: 95% 94% 94% 95%  PainSc:        Isolation Precautions No active isolations  Medications Medications  valproate (DEPACON) 2,000 mg in dextrose 5 % 50 mL IVPB (2,000 mg Intravenous New Bag/Given 08/13/19 1922)  valproate (DEPACON) 500 mg in dextrose 5 % 50 mL IVPB (has no administration in time range)  levETIRAcetam (KEPPRA) IVPB 1000 mg/100 mL premix (0 mg Intravenous Stopped 08/13/19 1737)  0.9 %  sodium chloride infusion (75 mL/hr Intravenous New Bag/Given 08/13/19 1948)  atorvastatin (LIPITOR) tablet 80 mg (has no administration in time range)  tamsulosin (FLOMAX) capsule 0.4 mg (has no administration in time range)  insulin aspart (novoLOG) injection 0-15 Units (has no administration in time range)  insulin aspart (novoLOG) injection 0-5 Units (has no  administration in time range)    Mobility walks with person assist     Focused Assessments Neuro Assessment Handoff:  Swallow screen pass? N/A   NIH Stroke Scale ( + Modified Stroke Scale Criteria)  Interval: Initial Level of Consciousness (1a.)   : Alert, keenly responsive LOC Questions (1b. )   +: Answers neither question correctly LOC Commands (1c. )   + : Performs both tasks correctly Best Gaze (2. )  +: Normal Visual (3. )  +: No visual loss Facial Palsy (4. )    : Partial paralysis  Motor Arm, Left (5a. )   +: No drift Motor Arm, Right (5b. )   +: No drift Motor Leg, Left (6a. )   +: No drift Motor Leg, Right (6b. )   +: No drift Limb Ataxia (7. ): Present in two limbs Sensory (8. )   +: Normal, no sensory loss Best Language (9. )   +: Severe aphasia Dysarthria (10. ): Mild-to-moderate dysarthria, patient slurs at least some words and, at worst, can be understood with some difficulty Extinction/Inattention (11.)   +: No Abnormality Modified SS Total  +: 4 Complete NIHSS TOTAL: 9     Neuro Assessment: Exceptions to [facility] Neuro Checks:   Initial (08/13/19 1614)  Last Documented NIHSS Modified Score: 4 (08/13/19 1614) Has TPA been given? No If patient is a Neuro Trauma and patient is going to OR before floor call report to 4N Charge nurse: [phone] or [phone]     R Recommendations: See Admitting Provider Note  Report given to:   Additional Notes:

## 2019-08-13 NOTE — H&P (Signed)
History and Physical    [name] Frank. VOH:[id number] DOB: Aug 16, 1950 DOA: 08/13/2019  PCP: Darreld Mclean, MD  Patient coming from: Home, wife at bedside  I have personally briefly reviewed patient's old medical records in [place]  Chief Complaint: confusion, trouble word-finding  HPI: [name] Frank. is a 69 y.o. male with medical history significant for Hx of subdural and subarachnoid hematoma, seizure, hx of hydrocephalus s/p VP stunt 11/2018, HTN, paroxysmal atrial fibrillation, uncontrolled Type 2 DM, CAD s/p stent who presents with    Patient sustained a fall back in May 2020 with resulting subdural and subarachnoid hematoma. Initially did well but subsequently developed more confusion, memory loss, garbled speech and was thought to have complex partial seizure and started on Keppra 500mg  BID in mid-May 2020. Later referred by outpatient neurology for shunt placement after hydrocephalus noted on MRI in September 2020.  He was seen a few days ago on 6/1 in the ED for aphasia, word finding difficulties and left sided facial droop. Neurology was consulted and was he was resumed on [place] since wife was unsure if he had been taking it. MRI was negative and he was discharged home.   Patient followed up with [facility] neurology associates yesterday for his confusion and had abnormal EEG showing left hemisphere rhythmic slowing concerning for partial status epilepticus.  He was given IV Depacon and loaded with 2000mg  of Keppra. Noted to have immediate language improvement. Advised to start Keppra 1000mg  BID today.   Wife says this morning he woke up and continued to be confused. This has been worsening over the past 4 days. He poured coffee on his toast and had trouble remember names of his cats. Could not use fork and knife to cut his pancakes. Had trouble word-finding. Also appeared to have spatial disorientation. Wife thinks he was walking slower than usual and drifted to the  right often. Also had an episode of urinary incontinence yesterday that has not happened since September of last year when he was noted to have hydrocephalus.   ED Course: He was afebrile, bradycardic down to 50s and normotensive on room air.  labs unremarkable except for plt of 147. Glucose of 247.  MRI brain with no acute abnormalities and right frontal shunt catheter in place. No hydrocephalus.   Neurology evaluated patient in the ED and he was given a loading dose of 2 g of Depakote IV.  Also started on Keppra 1 g twice daily IV and Depakote 500 mg 3 times daily IV.  Review of Systems: Unable to obtain due to patient's word finding difficulties Past Medical History:  Diagnosis Date  . Abnormality of gait    uses walker and wheelchair  . Cognitive deficit as late effect of traumatic brain injury ([place])   . Coronary artery disease    cardiologist-- dr s. Jake Bathe---  04-06-2014  NSTEMI  s/p  cardiac cath DES x1 to LAD and POBA to D1  . Dysrhythmia   . History of non-ST elevation myocardial infarction (NSTEMI)    04-06-2013  s/p  coronary stent and PCI  . Hydrocele, bilateral   . Hyperlipemia   . Hypertension   . Lower urinary tract symptoms (LUTS)   . Memory deficit   . PAF (paroxysmal atrial fibrillation) ([place]) cardilogist-- dr Jake Bathe   on-set 05/ 2020 during admission for [facility],  w/ RVR,  with cardizem/ toprol, back in NSR,  f/u by cardiologist , event monitor 09-16-2018 no results in epic by  per pt wife was told result ok with no afib  . S/P drug eluting coronary stent placement    04-06-2014    DES x1 to LAD and POBA to D1  . SAH (subarachnoid hemorrhage) [facility]) neurologist-  dr Terrace Arabia--- last head CT 10-01-2018 resolving (10-23-2018  residual deficits memory issues, abnormal gait, congnitive   w/ SDH due to unwitnessed fall, traumatic head injury---- admission 07-14-2018 , d/c'd 08-01-2018,  readmitted 3 days later with late TBI effects,  d/c'd from rehab 08-22-2018  . Seizure  disorder [facility])    followed by dr Terrace Arabia  . Subdural hematoma ([facility])   . Type 2 diabetes mellitus ([facility])    followed by pcp  . 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 CATARACT EXTRACTION W/ INTRAOCULAR LENS  IMPLANT, BILATERAL  2008; 2009  . CORONARY ANGIOPLASTY WITH STENT PLACEMENT  04-06-2014     DES x1 to LAD and POBA to D1  . HYDROCELE EXCISION Bilateral 10/27/2018   Procedure: HYDROCELECTOMY ADULT;  Surgeon: Ihor Gully, MD;  Location: [facility];  Service: Urology;  Laterality: Bilateral;  . TONSILLECTOMY  child  . 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   Procedure: Right ventriculoperitoneal shunt placement;  Surgeon: Jadene Pierini, MD;  Location: [facility];  Service: Neurosurgery;  Laterality: Right;     reports that he has never smoked. He has never used smokeless tobacco. He reports previous alcohol use. He reports that he does not use drugs.  No Known Allergies  Family History  Problem Relation Age of Onset  . Stroke Father   . Hypertension Father   . Hyperlipidemia Father   . Early death Mother   . Colitis Sister   . Appendicitis Brother      Prior to Admission medications   Medication Sig Start Date End Date Taking? Authorizing Provider  atorvastatin (LIPITOR) 80 MG tablet Take 1 tablet (80 mg total) by mouth daily. Patient taking differently: Take 80 mg by mouth every evening.  02/19/19  Yes Copland, Gwenlyn Found, MD  diazepam (VALIUM) 5 MG tablet Take 1 tablet (5 mg total) by mouth every 6 (six) hours as needed for anxiety (for confusion). 08/12/19  Yes Glean Salvo, NP  glipiZIDE (GLUCOTROL XL) 10 MG 24 hr tablet Take 1 tablet (10 mg total) by mouth 2 (two) times daily. 02/19/19  Yes Copland, Gwenlyn Found, MD  levETIRAcetam (KEPPRA) 500 MG tablet Take 2 tablets (1,000 mg total) by mouth 2 (two) times daily. Patient taking differently: Take 500 mg by mouth 2 (two) times daily.   08/12/19  Yes Glean Salvo, NP  metFORMIN (GLUCOPHAGE) 500 MG tablet Take 1 tablet (500 mg total) by mouth 2 (two) times daily with a meal. 12/18/18  Yes Copland, Gwenlyn Found, MD  metoprolol succinate (TOPROL-XL) 50 MG 24 hr tablet Take 1 tablet (50 mg total) by mouth daily. Take with or immediately following a meal. 02/19/19  Yes Copland, Gwenlyn Found, MD  tamsulosin (FLOMAX) 0.4 MG CAPS capsule Take 1 capsule (0.4 mg total) by mouth daily after supper. 08/22/18  Yes Love, Evlyn Kanner, PA-C  acetaminophen (TYLENOL) 325 MG tablet Take 2 tablets (650 mg total) by mouth every 6 (six) hours as needed for mild pain. Patient not taking: Reported on 08/13/2019 07/18/18   Juliet Rude, PA-C    Physical Exam: Vitals:   08/13/19 1800 08/13/19 1815 08/13/19 1830 08/13/19 1845  BP: 128/62 128/62  122/69 131/61  Pulse: (!) 51 (!) 50 (!) 49 (!) 51  Resp: (!) 27 (!) 22 (!) 26 (!) 28  Temp:      TempSrc:      SpO2: 95% 94% 94% 95%    Constitutional: NAD, calm, comfortable, elderly male laying flat in bed Vitals:   08/13/19 1800 08/13/19 1815 08/13/19 1830 08/13/19 1845  BP: 128/62 128/62 122/69 131/61  Pulse: (!) 51 (!) 50 (!) 49 (!) 51  Resp: (!) 27 (!) 22 (!) 26 (!) 28  Temp:      TempSrc:      SpO2: 95% 94% 94% 95%   Eyes: PERRL, lids and conjunctivae normal ENMT: Mucous membranes are moist.  Neck: normal, supple Respiratory: clear to auscultation bilaterally, no wheezing, no crackles. Normal respiratory effort. No accessory muscle use.  Cardiovascular: Sinus bradycardia with heart rate in the 50s on telemetry, no murmurs / rubs / gallops. No extremity edema. 2+ pedal pulses.  Abdomen: no tenderness, no masses palpated. Bowel sounds positive.  Musculoskeletal: no clubbing / cyanosis. No joint deformity upper and lower extremities. Good ROM, no contractures. Normal muscle tone.  Skin: no rashes, lesions, ulcers. No induration Neurologic: No facial asymmetry.  Patient alert but unable to answer  orientation questions.  Patient would only say "discussed with you"  when asked different orientation questions.  He was able to lift lower extremity on command but had difficulty following command to lift his upper extremities.  He was not able to follow through with heel-to-shin.  Had past finger pointing with finger-to-nose and appeared to have some left hemineglect.  Patient occasionally would answer properly with one-word answers and spoke in one full sentence that was coherent and appropriate.  He was unable to name simple objects like a pen and a piece paper but was able to name a pair of gloves. Psychiatric: Alert but had word finding difficulties. Normal mood.     Labs on Admission: I have personally reviewed following labs and imaging studies  CBC: Recent Labs  Lab 08/11/19 1922 08/13/19 1451  WBC 4.9 5.7  NEUTROABS 3.1  --   HGB 15.7 15.4  HCT 46.5 46.7  MCV 87.2 87.5  PLT 146* 147*   Basic Metabolic Panel: Recent Labs  Lab 08/11/19 1922 08/12/19 1648 08/13/19 1410  NA 135  --  136  K 4.4  --  4.9  CL 102  --  102  CO2 22  --  20*  GLUCOSE 302* 300* 247*  BUN 18  --  18  CREATININE 0.85  --  0.89  CALCIUM 9.0  --  8.9   GFR: Estimated Creatinine Clearance: 97.4 mL/min (by C-G formula based on SCr of 0.89 mg/dL). Liver Function Tests: Recent Labs  Lab 08/11/19 1922 08/13/19 1410  AST 19 16  ALT 26 19  ALKPHOS 59 59  BILITOT 0.8 1.2  PROT 6.3* 6.7  ALBUMIN 3.7 3.7   No results for input(s): LIPASE, AMYLASE in the last 168 hours. No results for input(s): AMMONIA in the last 168 hours. Coagulation Profile: Recent Labs  Lab 08/11/19 1922  INR 1.0   Cardiac Enzymes: No results for input(s): CKTOTAL, CKMB, CKMBINDEX, TROPONINI in the last 168 hours. BNP (last 3 results) No results for input(s): PROBNP in the last 8760 hours. HbA1C: Recent Labs    08/12/19 1648  HGBA1C 10.5*   CBG: Recent Labs  Lab 08/11/19 2000  GLUCAP 249*   Lipid  Profile: No results for input(s): CHOL, HDL,  LDLCALC, TRIG, CHOLHDL, LDLDIRECT in the last 72 hours. Thyroid Function Tests: Recent Labs    08/12/19 1648  TSH 1.820   Anemia Panel: No results for input(s): VITAMINB12, FOLATE, FERRITIN, TIBC, IRON, RETICCTPCT in the last 72 hours. Urine analysis:    Component Value Date/Time   COLORURINE YELLOW 08/11/2019 2216   APPEARANCEUR CLEAR 08/11/2019 2216   LABSPEC >1.046 (H) 08/11/2019 2216   PHURINE 5.0 08/11/2019 2216   GLUCOSEU >=500 (A) 08/11/2019 2216   HGBUR NEGATIVE 08/11/2019 2216   BILIRUBINUR NEGATIVE 08/11/2019 2216   KETONESUR NEGATIVE 08/11/2019 2216   PROTEINUR NEGATIVE 08/11/2019 2216   UROBILINOGEN 0.2 12/02/2006 1058   NITRITE NEGATIVE 08/11/2019 2216   LEUKOCYTESUR NEGATIVE 08/11/2019 2216    Radiological Exams on Admission: EEG  Result Date: 08/12/2019 Levert Feinstein, MD     08/13/2019  6:21 PM HISTORY: 69 year old male with history of left subdural hematoma, seizure, status post VP shunt, presented with sudden onset language difficulty, confusion since evening of August 17, 2019, TECHNIQUE: This is emergency 16 channel EEG recording for evaluation of sudden onset confusion, it was performed during wakefulness sitting position.  Hyperventilation and photic stimulation were not performed.  There was frequent muscle and eye movement artifact during the recording. Posterior dominant waking rhythm consistent of rhythmic alpha range activity, at right hemisphere, there is apparent asymmetry, left side has diffuse higher amplitude, slower activity, frontal dominant.  In addition, there was also frequent protrusion of rhythmic 2 Hz slowing involving left hemisphere, occasionally spreading to mirror lead at right frontal region.  The longest run of rhythmic slowing of left hemisphere lasting more than 2 pages, there was moderate improvement, less frequent appearance of left hemisphere rhythmic slowing following 1 g of Depacon IV infusion.   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 Levert Feinstein, M.D. Ph.D. [facility] [address] [place], [place] [address] Phone: [phone] Fax:      [phone]   CT Angio Head W/Cm &/Or Wo Cm  Result Date: 08/11/2019 CLINICAL DATA:  Altered mental status, unclear cause. Additional history provided: Expressive aphasia. NIH 1. EXAM: CT ANGIOGRAPHY HEAD AND NECK TECHNIQUE: Multidetector CT imaging of the head and neck was performed using the standard protocol during bolus administration of intravenous contrast. Multiplanar CT image reconstructions and MIPs were obtained to evaluate the vascular anatomy. Carotid stenosis measurements (when applicable) are obtained utilizing NASCET criteria, using the distal internal carotid diameter as the denominator. CONTRAST:  62mL OMNIPAQUE IOHEXOL 350 MG/ML SOLN COMPARISON:  Noncontrast head CT 03/02/2019, brain MRI 11/15/2018, CT angiogram head/neck 07/28/2018 FINDINGS: CT HEAD FINDINGS Brain: Examination limited by motion artifact greatest at the level of the superior frontal lobes. Stable position of a right frontoparietal approach ventricular catheter which terminates within the right lateral ventricle. The ventricular system has not significantly changed in size or configuration as compared to prior head CT 03/02/2019. There is no acute intracranial hemorrhage. No definite demarcated cortical infarct is identified. No extra-axial fluid collection. No evidence of intracranial mass. No midline shift. Vascular: Reported below. Skull: Normal. Negative for fracture or focal lesion. Sinuses: Mild ethmoid sinus mucosal thickening. No significant mastoid effusion. Orbits: No acute abnormality. Review of the MIP images confirms the above findings CTA NECK FINDINGS  Aortic arch: Atherosclerotic plaque within the visualized aortic arch and proximal major branch vessels of the neck. No  hemodynamically significant innominate or proximal subclavian artery stenosis. Right carotid system: CCA and ICA patent within the neck without significant stenosis (50% or greater). Mild calcified plaque at the carotid bifurcation and within the proximal ICA. Left carotid system: CCA and ICA patent within the neck without significant stenosis (50% or greater). Mild mixed plaque within the carotid bifurcation and proximal ICA. Vertebral arteries: The right vertebral artery is patent throughout the neck without significant stenosis. 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 There is reconstitution of irregular flow within the V3 and proximal V4 left vertebral artery, which may be secondary to retrograde flow. As before, there are multifocal high-grade stenoses within the V3 and proximal V4 left vertebral artery. Skeleton: No acute bony abnormality or aggressive osseous lesion. Cervical spondylosis without high-grade bony spinal canal narrowing. Other neck: No neck mass or cervical lymphadenopathy. Upper chest: No consolidation within the imaged lung apices. Calcified granuloma within the right upper lobe. Review of the MIP images confirms the above findings CTA HEAD FINDINGS Anterior circulation: The intracranial internal carotid arteries are patent without significant stenosis. The M1 middle cerebral arteries are patent without significant stenosis. No M2 proximal branch occlusion or high-grade proximal stenosis is identified. The anterior cerebral arteries are patent without high-grade proximal stenosis. No intracranial aneurysm is identified. Posterior circulation: As described, there is reconstitution of regular flow within the V3 and proximal V4 left vertebral artery, possibly due to retrograde flow. Sites of high-grade stenosis within  the V3 and proximal V4 left vertebral artery. Flow is demonstrated within the left PICA proximally. The intracranial right vertebral artery is patent without significant stenosis, as is the basilar artery. Predominantly fetal origin of the posterior predominantly fetal origin of the left posterior cerebral artery. The posterior cerebral arteries are patent bilaterally. As before, there is a moderate/severe stenosis within the proximal P2 left posterior cerebral artery (series 15, image 56). The right posterior communicating artery is hypoplastic or absent. Venous sinuses: Within limitations of contrast timing, no convincing thrombus. Anatomic variants: As described Review of the MIP images confirms the above findings IMPRESSION: CT head: 1. Examination limited by motion artifact greatest at the level of the superior frontal lobes. 2. No definite evidence of acute intracranial abnormality. 3. Stable position of a right frontoparietal approach ventricular catheter. Stable size and configuration of the ventricular system as compared to prior CT 03/02/2019. 4. Mild ethmoid sinus mucosal thickening. CTA neck: 1. The bilateral common and internal carotid arteries are patent within the neck without significant stenosis. Mild atherosclerotic plaque within the carotid systems as described. 2. 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 There is reconstitution of irregular flow within the V3 and proximal V4 left vertebral artery, which may be secondary to retrograde flow. As before, there are multifocal high-grade stenoses within the V3 and proximal V4 left vertebral artery. 3. The right vertebral artery is patent throughout the neck without significant stenosis. CTA head: 1. Reconstitution of enhancement within the intracranial left vertebral artery as described, which may be due to retrograde flow. Redemonstrated multifocal high-grade stenoses within the V3 and  proximal V4 left vertebral artery. The left PICA is patent proximally. 2. Redemonstrated moderate/severe stenosis within the proximal P2 left posterior cerebral artery. 3. No other intracranial high-grade proximal arterial stenosis is identified. Electronically Signed   By: Jackey Loge DO   On: 08/11/2019 21:47   CT Angio Neck W and/or Wo Contrast  Result Date: 08/11/2019 CLINICAL DATA:  Altered mental status, unclear cause. Additional history provided: Expressive aphasia. NIH 1. EXAM: CT ANGIOGRAPHY HEAD AND NECK TECHNIQUE: Multidetector CT imaging of the head and neck was performed using the standard protocol during bolus administration of intravenous contrast. Multiplanar CT image reconstructions and MIPs were obtained to evaluate the vascular anatomy. Carotid stenosis measurements (when applicable) are obtained utilizing NASCET criteria, using the distal internal carotid diameter as the denominator. CONTRAST:  75mL OMNIPAQUE IOHEXOL 350 MG/ML SOLN COMPARISON:  Noncontrast head CT 03/02/2019, brain MRI 11/15/2018, CT angiogram head/neck 07/28/2018 FINDINGS: CT HEAD FINDINGS Brain: Examination limited by motion artifact greatest at the level of the superior frontal lobes. Stable position of a right frontoparietal approach ventricular catheter which terminates within the right lateral ventricle. The ventricular system has not significantly changed in size or configuration as compared to prior head CT 03/02/2019. There is no acute intracranial hemorrhage. No definite demarcated cortical infarct is identified. No extra-axial fluid collection. No evidence of intracranial mass. No midline shift. Vascular: Reported below. Skull: Normal. Negative for fracture or focal lesion. Sinuses: Mild ethmoid sinus mucosal thickening. No significant mastoid effusion. Orbits: No acute abnormality. Review of the MIP images confirms the above findings CTA NECK FINDINGS Aortic arch: Atherosclerotic plaque within the visualized  aortic arch and proximal major branch vessels of the neck. No hemodynamically significant innominate or proximal subclavian artery stenosis. Right carotid system: CCA and ICA patent within the neck without significant stenosis (50% or greater). Mild calcified plaque at the carotid bifurcation and within the proximal ICA. Left carotid system: CCA and ICA patent within the neck without significant stenosis (50% or greater). Mild mixed plaque within the carotid bifurcation and proximal ICA. Vertebral arteries: The right vertebral artery is patent throughout the neck without significant stenosis. 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 There is reconstitution of irregular flow within the V3 and proximal V4 left vertebral artery, which may be secondary to retrograde flow. As before, there are multifocal high-grade stenoses within the V3 and proximal V4 left vertebral artery. Skeleton: No acute bony abnormality or aggressive osseous lesion. Cervical spondylosis without high-grade bony spinal canal narrowing. Other neck: No neck mass or cervical lymphadenopathy. Upper chest: No consolidation within the imaged lung apices. Calcified granuloma within the right upper lobe. Review of the MIP images confirms the above findings CTA HEAD FINDINGS Anterior circulation: The intracranial internal carotid arteries are patent without significant stenosis. The M1 middle cerebral arteries are patent without significant stenosis. No M2 proximal branch occlusion or high-grade proximal stenosis is identified. The anterior cerebral arteries are patent without high-grade proximal stenosis. No intracranial aneurysm is identified. Posterior circulation: As described, there is reconstitution of regular flow within the V3 and proximal V4 left vertebral artery, possibly due to retrograde flow. Sites of high-grade stenosis within the V3 and proximal V4 left vertebral artery. Flow is  demonstrated within the left PICA proximally. The intracranial right vertebral artery is patent without significant stenosis, as is the basilar artery. Predominantly fetal origin of the posterior predominantly fetal origin of the left posterior cerebral artery. The posterior cerebral arteries are patent bilaterally. As before, there is a moderate/severe stenosis within the proximal P2 left posterior cerebral artery (series 15, image 56). The right posterior communicating artery is hypoplastic or absent. Venous sinuses: Within limitations of contrast timing, no convincing thrombus. Anatomic variants: As described Review of the MIP images confirms the above findings IMPRESSION: CT head: 1.  Examination limited by motion artifact greatest at the level of the superior frontal lobes. 2. No definite evidence of acute intracranial abnormality. 3. Stable position of a right frontoparietal approach ventricular catheter. Stable size and configuration of the ventricular system as compared to prior CT 03/02/2019. 4. Mild ethmoid sinus mucosal thickening. CTA neck: 1. The bilateral common and internal carotid arteries are patent within the neck without significant stenosis. Mild atherosclerotic plaque within the carotid systems as described. 2. 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 There is reconstitution of irregular flow within the V3 and proximal V4 left vertebral artery, which may be secondary to retrograde flow. As before, there are multifocal high-grade stenoses within the V3 and proximal V4 left vertebral artery. 3. The right vertebral artery is patent throughout the neck without significant stenosis. CTA head: 1. Reconstitution of enhancement within the intracranial left vertebral artery as described, which may be due to retrograde flow. Redemonstrated multifocal high-grade stenoses within the V3 and proximal V4 left vertebral artery. The left PICA is patent  proximally. 2. Redemonstrated moderate/severe stenosis within the proximal P2 left posterior cerebral artery. 3. No other intracranial high-grade proximal arterial stenosis is identified. Electronically Signed   By: Jackey Loge DO   On: 08/11/2019 21:47   MR BRAIN WO CONTRAST  Result Date: 08/12/2019 CLINICAL DATA:  Initial evaluation for acute expressive aphasia, altered mental status. EXAM: MRI HEAD WITHOUT CONTRAST TECHNIQUE: Multiplanar, multiecho pulse sequences of the brain and surrounding structures were obtained without intravenous contrast. COMPARISON:  Prior CTs from 08/11/2019. FINDINGS: Brain: Susceptibility artifact related to a right-sided shunt catheter, mildly limiting assessment. Generalized age-related cerebral atrophy. No significant cerebral white matter disease for age. No abnormal foci of restricted diffusion to suggest acute or subacute ischemia. Gray-white matter differentiation maintained. No encephalomalacia to suggest chronic cortical infarction. No evidence for acute or chronic intracranial hemorrhage. Right frontal approach shunt catheter in place with tip terminating near the foramen of Monro. Stable ventricular size without hydrocephalus or transependymal flow of CSF. No mass lesion, midline shift, or mass effect. No extra-axial fluid collection. Pituitary gland suprasellar region within normal limits. Midline structures intact. Vascular: Poorly defined flow void within the proximal left V4 segment related to underlying atherosclerotic disease, better seen on prior CTA. Major intracranial vascular flow voids otherwise maintained. Skull and upper cervical spine: Craniocervical junction within normal limits. Bone marrow signal intensity normal. Susceptibility artifact at the right frontal scalp related to a VP shunt catheter. Scalp soft tissues demonstrate no acute finding. Sinuses/Orbits: Globes and orbital soft tissues demonstrate no acute finding. Patient status post bilateral  ocular lens replacement. Paranasal sinuses are largely clear. Trace bilateral mastoid effusions, of doubtful significance. Inner ear structures grossly normal. Other: None. IMPRESSION: 1. No acute intracranial abnormality. 2. 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 No hydrocephalus. Electronically Signed   By: Rise Mu M.D.   On: 08/12/2019 04:14   DG Chest Port 1 View  Result Date: 08/11/2019 CLINICAL DATA:  Altered mental status and chest pain EXAM: PORTABLE CHEST 1 VIEW COMPARISON:  07/18/2018 FINDINGS: Cardiac shadow is enlarged. Aortic calcifications are noted. Ventricular shunt is noted over the right chest wall. The lungs are clear. No bony abnormality is noted. IMPRESSION: No acute abnormality noted. Electronically Signed   By: Alcide Clever M.D.   On: 08/11/2019 22:25      Assessment/Plan  Partial status epilepticus Admit to  progressive unit Neurology has already seen patient in the ED. Will receive Depakote 2 g IV now Continue Keppra 1 g twice daily IV, Depakote 500 mg 3 times daily IV Placed on LTM, seizure precautions  Asymptomatic bradycardia Unclear if this could be secondary to his status epilepticus.  Hold metoprolol.  Uncontrolled type 2 diabetes Last hemoglobin A1c yesterday was 10.5 Placed on moderate sliding scale  Paroxysmal atrial fibrillation Not on anticoagulation due to history of SDH/SAH Hold metoprolol for now due to bradycardia  CAD s/p stent Continue statin No longer on aspirin due to history of SDH/SAH  Hypertension Stable  DVT prophylaxis:.SCDs Code Status: Full Family Communication: Plan discussed with patient and wife at bedside  disposition Plan: Home with at least 2 midnight stays  Consults called: Neurology Admission status: inpatient  Status is: Inpatient  Remains inpatient appropriate because:Inpatient level of care appropriate due to severity of  illness   Dispo: The patient is from: Home              Anticipated d/c is to: Home              Anticipated d/c date is: > 3 days              Patient currently is not medically stable to d/c.         Anselm Jungling DO [facility]   If 7PM-7AM, please contact night-coverage [email or web address]   08/13/2019, 7:38 PM

## 2019-08-13 NOTE — ED Notes (Signed)
Attempted to call report. No answer.

## 2019-08-13 NOTE — Addendum Note (Signed)
Addended by: Jennette Leask on: 08/13/2019 06:22 PM   Modules accepted: Orders  

## 2019-08-13 NOTE — Telephone Encounter (Signed)
Patient was given IV Depacon infusion during 17 minutes EEG recording on August 12, 2019, he immediately showed some improvement, his language difficulty has some improvement  He was advised to increase his Keppra dose to 2 g on June 2, and remain on Keppra 1000 twice a day   Keppra level from August 12, 2019 still pending   He had a history of left subdural hematoma, along with his elevated glucose, potential noncompliance with antielliptic medications all contributed to his partial status epilepticus, which was noted since August 17, 2019,

## 2019-08-13 NOTE — Consult Note (Addendum)
Neurology Consultation  Reason for Consult: Aphasia and possible seizure activity  Referring Physician: Dr. Rubin Payor  CC: Aphasia with possible seizure activity in a patient with NPH and shunt  History is obtained from: Wife and notes  HPI: [name] Frank. is a 69 y.o. 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  Patient was seen by Margie Ege of [facility] on 08/12/2019 after his wife noted that over the past 48 hours he had a significant decline in his neurological functioning, with slurred speech, right-sided facial droop and difficulty following verbal commands.  Apparently his symptoms included pouring coffee on his [place] toast instead of syrup, getting confused and also making paraphasic errors while speaking.  Last known normal was on Sunday night.  At baseline, he is able to do his own taxes and manages his medications. He is on Keppra - his wife thinks he has been taking it, but is unsure.  Initially, the patient went to the emergency room and had an MRI of the brain which showed no acute intracranial abnormality, with right frontal approach shunt catheter in place, and stable size and configuration of the ventricular system.    He went to [facility]) for an assessment. EEG showed what was felt to be partial status epilepticus, due to rhythmic slowing on the left. At that time Dr. Terrace Arabia felt he was in partial status epilepticus. He was loaded with valproic acid (but was not prescribed a scheduled dose), instructed to take 2000 mg Keppra as an oral load that evening and his home Keppra scheduled dose was also increased to 1000 mg BID (from 500 mg BID). His firs Dx of seizure was this past Monday due to the EEG findings. His  APP at [facility] called at 9 AM this morning to see if the patient was any better.  He had just woken up at that time.  At 12:00 wife stated he was still confused, and was having difficulty remembering how to comb his hair and was communicating with hand motions.  He was sent to the ED for further evaluation.    Past Medical History:  Diagnosis Date  . Abnormality of gait    uses walker and wheelchair  . Cognitive deficit as late effect of traumatic brain injury (HCC)   . Coronary artery disease    cardiologist-- dr s. Dickie La---  04-06-2014  NSTEMI  s/p  cardiac cath DES x1 to LAD and POBA to D1  . Dysrhythmia   . History of non-ST elevation myocardial infarction (NSTEMI)    04-06-2013  s/p  coronary stent and PCI  . Hydrocele, bilateral   . Hyperlipemia   . Hypertension   . Lower urinary tract symptoms (LUTS)   . Memory deficit   . PAF (paroxysmal atrial fibrillation) (HCC) cardilogist-- dr Dickie La   on-set 05/ 2020 during admission for [facility],  w/ RVR,  with cardizem/ toprol, back in [facility],  f/u by cardiologist , event monitor 09-16-2018 no results in epic by per pt wife was told result ok with no afib  . S/P drug eluting coronary stent placement    04-06-2014  @[facility]   DES x1 to LAD and POBA to D1  . SAH (subarachnoid hemorrhage) [facility]) neurologist-  dr [facility]--- last head CT 10-01-2018 resolving (10-23-2018  residual deficits memory issues,  abnormal gait, congnitive   w/ SDH due to unwitnessed fall, traumatic head injury---- admission 07-14-2018 , d/c'd 08-01-2018,  readmitted 3 days later with late TBI effects,  d/c'd from rehab 08-22-2018  . Seizure disorder [facility])    followed by dr Krista Blue  . Subdural hematoma ([place])   . Type 2 diabetes mellitus ([place])    followed by pcp  . 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 Stroke Father   . Hypertension Father   . Hyperlipidemia Father   . Early death Mother   . Colitis Sister   . Appendicitis  Brother    Social History:   reports that he has never smoked. He has never used smokeless tobacco. He reports previous alcohol use. He reports that he does not use drugs.  Medications No current facility-administered medications for this encounter.  Current Outpatient Medications:  .  acetaminophen (TYLENOL) 325 MG tablet, Take 2 tablets (650 mg total) by mouth every 6 (six) hours as needed for mild pain., Disp: , Rfl:  .  atorvastatin (LIPITOR) 80 MG tablet, Take 1 tablet (80 mg total) by mouth daily. (Patient taking differently: Take 80 mg by mouth every evening. ), Disp: 90 tablet, Rfl: 3 .  diazepam (VALIUM) 5 MG tablet, Take 1 tablet (5 mg total) by mouth every 6 (six) hours as needed for anxiety (for confusion)., Disp: 30 tablet, Rfl: 1 .  glipiZIDE (GLUCOTROL XL) 10 MG 24 hr tablet, Take 1 tablet (10 mg total) by mouth 2 (two) times daily., Disp: 180 tablet, Rfl: 3 .  levETIRAcetam (KEPPRA) 500 MG tablet, Take 2 tablets (1,000 mg total) by mouth 2 (two) times daily., Disp: 360 tablet, Rfl: 4 .  metFORMIN (GLUCOPHAGE) 500 MG tablet, Take 1 tablet (500 mg total) by mouth 2 (two) times daily with a meal., Disp: 180 tablet, Rfl: 3 .  metoprolol succinate (TOPROL-XL) 50 MG 24 hr tablet, Take 1 tablet (50 mg total) by mouth daily. Take with or immediately following a meal., Disp: 90 tablet, Rfl: 3 .  tamsulosin (FLOMAX) 0.4 MG CAPS capsule, Take 1 capsule (0.4 mg total) by mouth daily after supper., Disp: 30 capsule, Rfl: 1  [id number] to obtain due to altered mental status.   Exam: Current vital signs: BP 133/61 (BP Location: Right Arm)   Pulse (!) 52   Temp 98.5 F (36.9 C) (Oral)   Resp 14   SpO2 98%  Vital signs in last 24 hours: Temp:  [98.5 F (36.9 C)] 98.5 F (36.9 C) (06/03 1405) Pulse Rate:  [52] 52 (06/03 1405) Resp:  [14] 14 (06/03 1405) BP: (133)/(61) 133/61 (06/03 1405) SpO2:  [98 %] 98 % (06/03 1405)   Constitutional: Appears well-developed and well-nourished.   Eyes: No scleral injection HENT: No OP obstrucion Head: Normocephalic.  Cardiovascular: Normal rate and regular rhythm.  Respiratory: Effort normal, non-labored breathing GI: Soft.  No distension. There is no tenderness.  Skin: WDI  Neuro: Mental Status: Patient is awake, unable to express himself and/or consistently follow commands without visual cues.  Speech is not fluent. Cannot name or repeat.  Cannot give a reliable history.  Shows significant frustration that he cannot get his words out. Cranial Nerves: II: Visual Fields are full. PERRL.  III,IV, VI: EOMI with chronic ptosis left eye.  V: Facial sensation is symmetric to temperature VII: Right facial droop, which was recognized in previous notes.  VIII: hearing is  intact to voice X: Palate elevates symmetrically XI: Shoulder shrug is symmetric. XII: tongue is midline without atrophy or fasciculations.  Motor: Moving all extremities antigravity with no drift. Sensory: Sensation intact throughout Deep Tendon Reflexes: 2+ and symmetric in the biceps and patellae.  Plantars: Toes are downgoing bilaterally.  Cerebellar: FNF difficult but did not show any dysmetria  Labs I have reviewed labs in epic and the results pertinent to this consultation are:  CBC    Component Value Date/Time   WBC 5.7 08/13/2019 1451   RBC 5.34 08/13/2019 1451   HGB 15.4 08/13/2019 1451   HCT 46.7 08/13/2019 1451   PLT 147 (L) 08/13/2019 1451   MCV 87.5 08/13/2019 1451   MCH 28.8 08/13/2019 1451   MCHC 33.0 08/13/2019 1451   RDW 13.0 08/13/2019 1451   LYMPHSABS 1.3 08/11/2019 1922   MONOABS 0.4 08/11/2019 1922   EOSABS 0.1 08/11/2019 1922   BASOSABS 0.0 08/11/2019 1922    CMP     Component Value Date/Time   NA 136 08/13/2019 1410   K 4.9 08/13/2019 1410   CL 102 08/13/2019 1410   CO2 20 (L) 08/13/2019 1410   GLUCOSE 247 (H) 08/13/2019 1410   BUN 18 08/13/2019 1410   CREATININE 0.89 08/13/2019 1410   CALCIUM 8.9 08/13/2019 1410    PROT 6.7 08/13/2019 1410   ALBUMIN 3.7 08/13/2019 1410   AST 16 08/13/2019 1410   ALT 19 08/13/2019 1410   ALKPHOS 59 08/13/2019 1410   BILITOT 1.2 08/13/2019 1410   GFRNONAA >60 08/13/2019 1410   GFRAA >60 08/13/2019 1410    Lipid Panel     Component Value Date/Time   CHOL 171 02/19/2019 1102   TRIG 128.0 02/19/2019 1102   HDL 34.50 (L) 02/19/2019 1102   CHOLHDL 5 02/19/2019 1102   VLDL 25.6 02/19/2019 1102   LDLCALC 111 (H) 02/19/2019 1102    Outpatient EEG (6/2): This is an abnormal EEG, there is electrodiagnostic evidence of left hemisphere rhythmic slowing, occasionally spreading to adjacent right frontal mirror leads, above findings most to support a diagnosis of partial status epilepticus.  Imaging I have reviewed the images obtained:  MRI brain (6/2): No acute intracranial abnormality. 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 No hydrocephalus.  CTA neck (6/1): 1. The bilateral common and internal carotid arteries are patent within the neck without significant stenosis. Mild atherosclerotic plaque within the carotid systems as described. 2. 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 There is reconstitution of irregular flow within the V3 and proximal V4 left vertebral artery, which may be secondary to retrograde flow. As before, there are multifocal high-grade stenoses within the V3 and proximal V4 left vertebral artery. 3. The right vertebral artery is patent throughout the neck without significant stenosis.  CTA head (6/1): 1. Reconstitution of enhancement within the intracranial left vertebral artery as described, which may be due to retrograde flow. Redemonstrated multifocal high-grade stenoses within the V3 and proximal V4 left vertebral artery. The left PICA is patent proximally. 2. Redemonstrated moderate/severe stenosis  within the proximal P2 left posterior cerebral artery. 3. No other intracranial high-grade proximal arterial stenosis is identified.  CT head: Slight further decrease in lateral ventricle size since October.  Stable right superior frontal approach shunt.  No new intracranial abnormality.  Felicie Morn PA-C [facility] [phone] 08/13/2019, 4:01 PM    Assessment: 69 year-old male with history  of TBI resulting in SDH and SAH, s/p treatment with ventriculoperitoneal shunt for resultant NPH, who presents with a 48-hour history of worsening mentation, decreased ability to express himself and impaired ability to follow commands.  1. Exam shows impaired cognition and language function best localizable to the left perisylvian cortices.  2. Imaging has shown no etiology for the patient's symptoms, ventricular size being normal without evidence for shunt malfunction and also no evidence for acute stroke.   3. Patient received a 2 g loading dose of Keppra on 08/12/2019 along with a one time dose of valproic acid at the Neurology clinic. He initially improved in the clinic and was sent home, but today was noted by his wife to have worsened. Thus, the patient was sent to the ED.   4. Patient clearly has worsened, now showing significant expressive and receptive aphasia.  Thre is concern for simple partial status epilepticus. 5. LTM EEG shows left hemispheric rhythmic delta activity, which is on the ictal-interictal spectrum. This further increases the likelihood of an epileptic etiology for his presentation.   Impression: -Aphasia -Concern for partial complex seizures or nonconvulsive status epilepticus  Recommendations: -- Continue Keppra 1000 mg BID IV (which was increased from 500 mg BID by his outpatient Neurologist Dr. Terrace Arabia starting this AM) -- Depakote load 2 grams now IV, followed by Depakote 500 mg TID IV -- Continue LTM EEG  I have seen and examined the patient. I have formulated the  assessment and recommendations. My exam findings were observed and documented by Felicie Morn, PA.  Electronically signed: Dr. Caryl Pina

## 2019-08-13 NOTE — Telephone Encounter (Signed)
I called the patient to check on him this morning around 9 am, he had just woken up. His wife questioned him, he didn't know his address or names of his cats.  She was going to give him some more time to wake up.  I called back around 12, still confused, he took a shower, did not know what to do once he got out of the shower, stood at the sink, could not remember how to comb his hair, having continued trouble with word finding, mostly using hand motions.  Discussed with Dr. Terrace Arabia, will send the patient to ER for evaluation, felt to be partial status epilepticus, EEG yesterday showed rhythmic slowing on the left. This morning he took 1000 mg Keppra, last night 2000 mg Keppra.  Called Dr. Otelia Limes, let him know patient is on the way, also triage ER nurse, Ladona Ridgel.

## 2019-08-13 NOTE — Addendum Note (Signed)
Addended by: Levert Feinstein on: 08/13/2019 06:22 PM   Modules accepted: Orders

## 2019-08-13 NOTE — Care Plan (Signed)
LTM EEG reviewed till 69. Showed left hemispheric rhythmic delta activity. This is on the ictal-interictal spectrum. DR Otelia Limes was notified.   [name] Frank [name] Frank

## 2019-08-13 NOTE — ED Notes (Signed)
EEG at bedside.

## 2019-08-13 NOTE — ED Notes (Signed)
MD at bedside. 

## 2019-08-13 NOTE — Procedures (Signed)
   HISTORY: 69 year old male with history of left subdural hematoma, seizure, status post VP shunt, presented with sudden onset language difficulty, confusion since evening of August 17, 2019,  TECHNIQUE:  This is emergency 16 channel EEG recording for evaluation of sudden onset confusion, it was performed during wakefulness sitting position.  Hyperventilation and photic stimulation were not performed.  There was frequent muscle and eye movement artifact during the recording.  Posterior dominant waking rhythm consistent of rhythmic alpha range activity, at right hemisphere, there is apparent asymmetry, left side has diffuse higher amplitude, slower activity, frontal dominant.  In addition, there was also frequent protrusion of rhythmic 2 Hz slowing involving left hemisphere, occasionally spreading to mirror lead at right frontal region.  The longest run of rhythmic slowing of left hemisphere lasting more than 2 pages, there was moderate improvement, less frequent appearance of left hemisphere rhythmic slowing following 1 g of Depacon IV infusion.   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  Levert Feinstein, M.D. Ph.D.  [facility] [address] [place], [place] [address] Phone: [phone] Fax:      [phone]

## 2019-08-13 NOTE — ED Triage Notes (Signed)
Pt sent by outpt neurology for further evaluation of AMS, possible seizures and hydrocephalus, pt seen and d.c from here 2 days ago but pt possibly having more seizures. Pt is alert, oriented to self only, hard to follow commands. Denies any pain

## 2019-08-14 DIAGNOSIS — R569 Unspecified convulsions: Secondary | ICD-10-CM

## 2019-08-14 DIAGNOSIS — R001 Bradycardia, unspecified: Secondary | ICD-10-CM

## 2019-08-14 LAB — GLUCOSE, CAPILLARY
Glucose-Capillary: 130 mg/dL — ABNORMAL HIGH (ref 70–99)
Glucose-Capillary: 149 mg/dL — ABNORMAL HIGH (ref 70–99)
Glucose-Capillary: 152 mg/dL — ABNORMAL HIGH (ref 70–99)
Glucose-Capillary: 211 mg/dL — ABNORMAL HIGH (ref 70–99)
Glucose-Capillary: 138 mg/dL — ABNORMAL HIGH (ref 70–99)
Glucose-Capillary: 195 mg/dL — ABNORMAL HIGH (ref 70–99)

## 2019-08-14 LAB — COMPREHENSIVE METABOLIC PANEL
ALT: 17 U/L (ref 0–44)
AST: 29 U/L (ref 15–41)
Albumin: 3.1 g/dL — ABNORMAL LOW (ref 3.5–5.0)
Alkaline Phosphatase: 53 U/L (ref 38–126)
Anion gap: 9 (ref 5–15)
BUN: 16 mg/dL (ref 8–23)
CO2: 22 mmol/L (ref 22–32)
Calcium: 8.2 mg/dL — ABNORMAL LOW (ref 8.9–10.3)
Chloride: 103 mmol/L (ref 98–111)
Creatinine, Ser: 0.88 mg/dL (ref 0.61–1.24)
GFR calc Af Amer: >60 mL/min (ref >60)
GFR calc non Af Amer: >60 mL/min (ref >60)
Glucose, Bld: 161 mg/dL — ABNORMAL HIGH (ref 70–99)
Potassium: 4.7 mmol/L (ref 3.5–5.1)
Sodium: 134 mmol/L — ABNORMAL LOW (ref 135–145)
Total Bilirubin: 1.7 mg/dL — ABNORMAL HIGH (ref 0.3–1.2)
Total Protein: 5.5 g/dL — ABNORMAL LOW (ref 6.5–8.1)

## 2019-08-14 LAB — CBC
HCT: 44.6 % (ref 39.0–52.0)
Hemoglobin: 15.1 g/dL (ref 13.0–17.0)
MCH: 29.1 pg (ref 26.0–34.0)
MCHC: 33.9 g/dL (ref 30.0–36.0)
MCV: 85.9 fL (ref 80.0–100.0)
Platelets: 123 10*3/uL — ABNORMAL LOW (ref 150–400)
RBC: 5.19 MIL/uL (ref 4.22–5.81)
RDW: 12.7 % (ref 11.5–15.5)
WBC: 5.4 10*3/uL (ref 4.0–10.5)
nRBC: 0 % (ref 0.0–0.2)

## 2019-08-14 LAB — AMMONIA: Ammonia: 27 umol/L (ref 9–35)

## 2019-08-14 LAB — HGB A1C W/O EAG: Hgb A1c MFr Bld: 10.5 % — ABNORMAL HIGH (ref 4.8–5.6)

## 2019-08-14 LAB — LEVETIRACETAM LEVEL: Levetiracetam Lvl: 11.9 ug/mL (ref 10.0–40.0)

## 2019-08-14 LAB — TSH: TSH: 1.82 u[IU]/mL (ref 0.450–4.500)

## 2019-08-14 LAB — GLUCOSE, RANDOM: Glucose: 300 mg/dL — ABNORMAL HIGH (ref 65–99)

## 2019-08-14 LAB — VALPROIC ACID LEVEL: Valproic Acid Lvl: 84 ug/mL (ref 50.0–100.0)

## 2019-08-14 MED ORDER — VALPROATE SODIUM 500 MG/5ML IV SOLN
750.0000 mg | Freq: Three times a day (TID) | INTRAVENOUS | Status: DC
  Administered 2019-08-14 – 2019-08-17 (×9): 750 mg via INTRAVENOUS
  Filled 2019-08-14 (×11): qty 7.5

## 2019-08-14 NOTE — Progress Notes (Addendum)
Inpatient Diabetes Program Recommendations  AACE/ADA: New Consensus Statement on Inpatient Glycemic Control   Target Ranges:  Prepandial:   less than 140 mg/dL      Peak postprandial:   less than 180 mg/dL (1-2 hours)      Critically ill patients:  140 - 180 mg/dL   Results for [name] Frank, [name] "DON" (MRN [id number]) as of 08/14/2019 09:31  Ref. Range 08/13/2019 22:26 08/14/2019 03:43 08/14/2019 06:46 08/14/2019 07:49  Glucose-Capillary Latest Ref Range: 70 - 99 mg/dL 122 (H) 130 (H) 149 (H) 152 (H)   Review of Glycemic Control  Diabetes history: DM2 Outpatient Diabetes medications: Metformin 500 mg BID, Glipizide 10 mg BID Current orders for Inpatient glycemic control: Novolog 0-15 units TID with meals, Novolog 0-5 units QHS  Inpatient Diabetes Program Recommendations:   HgbA1C: A1C 10.5% on 08/12/19 indiciating an average glucose of 255 mg/dl over the past 2-3 months.  Prior A1C was 9.5% on 02/19/19.  Note: Noted consult for diabetes coordinator. Per chart, patient has memory issues due to TBI from May 2020, confusion, and trouble with word-finding. In reviewing chart, noted patient seen PCP on 02/19/19 and A1C was 9.5% at that time. Spoke with patient's wife over the phone and she reports that her husband takes his own medications and she does not monitor his medications. She looked at medication bottles there at the house and verifies that he is prescribed Metformin 500 mg BID and Glipizide 10 mg BID. Patient's wife states that patient does not check glucose at home but has all needed testing supplies. She states that patient gets upset with her if she tries to check his glucose. Discussed that his A1C is 10.5% on 08/12/19 indicating an average glucose of 255 mg/dl and that his prior A1C was 9.5% on 02/19/19. Discussed importance of DM control and discussed complications from uncontrolled DM. Asked patient's wife if she could start monitoring patient's medications to ensure he is taking them  consistently and correctly and if she could start checking patient's glucose at least once a day.  She states that she can start managing patient's medications and that she will try to get him to let her check his glucose at least once a day. She verbalized understanding of information discussed and states that she has no questions at this time related to DM. She asked that I let RN know that should would like the doctor to call her with an update on her husband after he is seen by rounding doctor today and also to let RN know that she plans to come to the hospital this afternoon to visit with patient. Sent message to Thurmond Butts, RN let him know that patient's wife would like MD to call after patient is seen and that she plans to visit with patient this afternoon. Agree with current orders for inpatient glycemic control.  Addendum 08/14/19@12 :45-Spoke with patient regarding DM control. Patient lying in bed with EEG leads attached. Patient would answer yes/no questions and shrugged his shoulder to indicate 'I don't know' to some questions asked. Informed patient that I spoke with his wife over the phone and she was able to confirm he is prescribed Metformin and Glipizide. Asked patient if he was taking DM medications consistently and he shrugged his shoulders to indicate 'I don't know'. Asked if patient was checking his glucose at home and he said "no".  Asked if he knew what an A1C was and he stated "no". Explained what an A1C is and informed patient that his  current A1C was 10.5% on 08/12/19 indicating his average glucose is 255 mg/dl over the past 2-3 weeks. Discussed glucose and A1C goals. Discussed importance of maintaining DM control to decrease risk of complications. Informed patient that I have asked his wife to help him with his medications to ensure he is taking them consistently and to help with checking glucose at least once a day. Asked patient if he would be willing to check glucose once a day and he stated  "yes". Asked if he understood information discussed and he stated "yes" and when asked if he had any questions he stated "no".    Thanks, Orlando Penner, RN, MSN, CDE Diabetes Coordinator Inpatient Diabetes Program [phone] (Team Pager from 8am to 5pm)

## 2019-08-14 NOTE — Progress Notes (Signed)
maint complete - no skin breakdown at a2, ekg1, ekg, t6

## 2019-08-14 NOTE — Evaluation (Signed)
Clinical/Bedside Swallow Evaluation Patient Details  Name: [name] Frank. MRN: [id number] Date of Birth: 1950-09-01  Today's Date: 08/14/2019 Time: SLP Start Time (ACUTE ONLY): 1039 SLP Stop Time (ACUTE ONLY): 1058 SLP Time Calculation (min) (ACUTE ONLY): 19 min  Past Medical History:  Past Medical History:  Diagnosis Date  . Abnormality of gait    uses walker and wheelchair  . Cognitive deficit as late effect of traumatic brain injury ([name])   . Coronary artery disease    cardiologist-- dr s. Jake Bathe---  04-06-2014  NSTEMI  s/p  cardiac cath DES x1 to LAD and POBA to D1  . Dysrhythmia   . History of non-ST elevation myocardial infarction (NSTEMI)    04-06-2013  s/p  coronary stent and PCI  . Hydrocele, bilateral   . Hyperlipemia   . Hypertension   . Lower urinary tract symptoms (LUTS)   . Memory deficit   . PAF (paroxysmal atrial fibrillation) ([name]) cardilogist-- dr Jake Bathe   on-set 05/ 2020 during admission for [facility],  w/ RVR,  with cardizem/ toprol, back in NSR,  f/u by cardiologist , event monitor 09-16-2018 no results in epic by per pt wife was told result ok with no afib  . S/P drug eluting coronary stent placement    04-06-2014  @[facility]   DES x1 to LAD and POBA to D1  . SAH (subarachnoid hemorrhage) [facility]) neurologist-  dr Krista Blue--- last head CT 10-01-2018 resolving (10-23-2018  residual deficits memory issues, abnormal gait, congnitive   w/ SDH due to unwitnessed fall, traumatic head injury---- admission 07-14-2018 , d/c'd 08-01-2018,  readmitted 3 days later with late TBI effects,  d/c'd from rehab 08-22-2018  . Seizure disorder [name])    followed by dr Krista Blue  . Subdural hematoma ([name])   . Type 2 diabetes mellitus ([name])    followed by pcp  . 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 CATARACT EXTRACTION W/ INTRAOCULAR LENS  IMPLANT, BILATERAL  2008; 2009  . CORONARY ANGIOPLASTY WITH STENT  PLACEMENT  04-06-2014  @[facility]    DES x1 to LAD and POBA to D1  . HYDROCELE EXCISION Bilateral 10/27/2018   Procedure: HYDROCELECTOMY ADULT;  Surgeon: [name], MD;  Location: [facility];  Service: Urology;  Laterality: Bilateral;  . TONSILLECTOMY  child  . 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   Procedure: Right ventriculoperitoneal shunt placement;  Surgeon: Judith Part, MD;  Location: [place];  Service: Neurosurgery;  Laterality: Right;   HPI:  Pt is a 69 y.o. male with medical history significant for Hx of subdural and subarachnoid hematoma, seizure, hx of hydrocephalus s/p VP stunt 11/2018, HTN, paroxysmal atrial fibrillation, uncontrolled Type 2 DM, CAD s/p stent who presented with confusion and word retrieval difficulty. MRI brain and CXR were negative for acute changes. EEG:  cortical dysfunction in the left hemisphere, maximal left frontotemporal region due to underlying structural abnormality.  lateralized rhythmic delta activity is also ictal-interictal continuum and indicates potential epileptogenicity. Pt failed [facility] due to his stopping drinking.    Assessment / Plan / Recommendation Clinical Impression  Pt was seen for bedside swallow evaluation and he denied a history of dysphagia. Pt required additional processing time and repetition to follow commands but was able to complete oral mechanism exam which was [facility]. He presented with adequate, natural dentition. He tolerated all solids and liquids without signs or symptoms of oropharyngeal dysphagia. A regular  texture diet with thin liquids is recommended at this time  and further skilled SLP services are not clinically indicated for swallowing.  SLP Visit Diagnosis: Dysphagia, unspecified (R13.10)    Aspiration Risk  No limitations    Diet Recommendation Regular;Thin liquid   Liquid Administration via: Cup;Straw Medication Administration: Whole meds with puree Supervision: Staff to assist with self  feeding Postural Changes: Seated upright at 90 degrees    Other  Recommendations Oral Care Recommendations: Oral care BID;Staff/trained caregiver to provide oral care   Follow up Recommendations None      Frequency and Duration            Prognosis        Swallow Study   General Date of Onset: 08/13/19 HPI: Pt is a 69 y.o. male with medical history significant for Hx of subdural and subarachnoid hematoma, seizure, hx of hydrocephalus s/p VP stunt 11/2018, HTN, paroxysmal atrial fibrillation, uncontrolled Type 2 DM, CAD s/p stent who presented with confusion and word retrieval difficulty. MRI brain and CXR were negative for acute changes. EEG:  cortical dysfunction in the left hemisphere, maximal left frontotemporal region due to underlying structural abnormality.  lateralized rhythmic delta activity is also ictal-interictal continuum and indicates potential epileptogenicity. Pt failed [facility] due to his stopping drinking.  Type of Study: Bedside Swallow Evaluation Previous Swallow Assessment: None Diet Prior to this Study: NPO Temperature Spikes Noted: No Respiratory Status: Room air History of Recent Intubation: No Behavior/Cognition: Alert;Cooperative;Confused;Other (Comment);Requires cueing(slow processing speed) Oral Cavity Assessment: Within Functional Limits Oral Care Completed by SLP: No Oral Cavity - Dentition: Adequate natural dentition Vision: Functional for self-feeding Self-Feeding Abilities: Able to feed self Patient Positioning: Upright in bed;Postural control adequate for testing Baseline Vocal Quality: Normal Volitional Swallow: Able to elicit    Oral/Motor/Sensory Function Overall Oral Motor/Sensory Function: Within functional limits   Ice Chips Ice chips: Within functional limits Presentation: Spoon   Thin Liquid Thin Liquid: Within functional limits Presentation: Straw    Nectar Thick Nectar Thick Liquid: Not tested   Honey Thick Honey Thick Liquid: Not  tested   Puree Puree: Within functional limits Presentation: Spoon   Solid     Solid: Within functional limits Presentation: Self Fed     Ena Demary I. Vear Clock, MS, CCC-SLP Acute Rehabilitation Services Office number [phone] Pager [phone]  Scheryl Marten 08/14/2019,12:22 PM

## 2019-08-14 NOTE — Progress Notes (Signed)
Subjective: LTM is running. Continues to be confused.   Objective: Current vital signs: BP (!) 130/49   Pulse (!) 49   Temp 98.4 F (36.9 C)   Resp 16   SpO2 97%  Vital signs in last 24 hours: Temp:  [98.3 F (36.8 C)-98.7 F (37.1 C)] 98.4 F (36.9 C) (06/04 0530) Pulse Rate:  [40-53] 49 (06/04 0330) Resp:  [12-28] 16 (06/04 0530) BP: (112-144)/(49-69) 130/49 (06/04 0530) SpO2:  [91 %-98 %] 97 % (06/04 0530)  Intake/Output from previous day: 06/03 0701 - 06/04 0700 In: 1785 [P.O.:120; I.V.:765; IV Piggyback:900] Out: 301 [Urine:301] Intake/Output this shift: No intake/output data recorded. Nutritional status:  Diet Order            Diet regular Room service appropriate? Yes with Assist; Fluid consistency: Thin  Diet effective now             HEENT: [place]/AT Lungs: Respirations unlabored Ext: Warm and well perfused  Neuro: Mental Status: Patient is awake. Speech is fragmentary and non-fluent. Most answers to questions are 1-2 words in length. Speech is hypophonic and non-dysarthric. Frequent perseveration on earlier-asked questions is noted. Able to follow basic motor commands. Can name a thumb, but not a pinky finger. Oriented to "Thursday". Unable to state the year. Correctly states the month, city and state. Overall, speech and cognition are mildly improved since yesterday, but still significantly impaired.  Cranial Nerves: II: PERRL. Fixates on examiner's face normally. Can track visually.  III,IV, VI: EOMI.  V: Facial sensation is symmetric to FT VII: No facial droop noted today.   VIII: hearing is intact to voice X: Hypophonic speech XI: Head is midlline XII: tongue is midline without atrophy or fasciculations.  Motor: Moving all extremities antigravity with no drift.  4+/5 bilateral upper extremities, knee extension and knee flexion.  Sensory: Intact to FT x 4.  Deep Tendon Reflexes: 2+ and symmetric brachioradialis bilaterally. 1+ patellae bilaterally   Plantars: Toes are downgoing bilaterally.  Cerebellar: No ataxia with FNF bilaterally. Continues to have difficulty correctly following this command. Slow movements noted. No tremor or asterixis.    Lab Results: Results for orders placed or performed during the hospital encounter of 08/13/19 (from the past 48 hour(s))  Comprehensive metabolic panel     Status: Abnormal   Collection Time: 08/13/19  2:10 PM  Result Value Ref Range   Sodium 136 135 - 145 mmol/L   Potassium 4.9 3.5 - 5.1 mmol/L   Chloride 102 98 - 111 mmol/L   CO2 20 (L) 22 - 32 mmol/L   Glucose, Bld 247 (H) 70 - 99 mg/dL    Comment: Glucose reference range applies only to samples taken after fasting for at least 8 hours.   BUN 18 8 - 23 mg/dL   Creatinine, Ser 8.27 0.61 - 1.24 mg/dL   Calcium 8.9 8.9 - 07.8 mg/dL   Total Protein 6.7 6.5 - 8.1 g/dL   Albumin 3.7 3.5 - 5.0 g/dL   AST 16 15 - 41 U/L   ALT 19 0 - 44 U/L   Alkaline Phosphatase 59 38 - 126 U/L   Total Bilirubin 1.2 0.3 - 1.2 mg/dL   GFR calc non Af Amer >60 >60 mL/min   GFR calc Af Amer >60 >60 mL/min   Anion gap 14 5 - 15    Comment: Performed at [facility], [address], [place], [place] [address]  CBC     Status: Abnormal   Collection  Time: 08/13/19  2:51 PM  Result Value Ref Range   WBC 5.7 4.0 - 10.5 K/uL   RBC 5.34 4.22 - 5.81 MIL/uL   Hemoglobin 15.4 13.0 - 17.0 g/dL   HCT 46.7 39.0 - 52.0 %   MCV 87.5 80.0 - 100.0 fL   MCH 28.8 26.0 - 34.0 pg   MCHC 33.0 30.0 - 36.0 g/dL   RDW 13.0 11.5 - 15.5 %   Platelets 147 (L) 150 - 400 K/uL   nRBC 0.0 0.0 - 0.2 %    Comment: Performed at [facility], [place] [address] [place], [place] [address]  SARS Coronavirus 2 by RT PCR (hospital order, performed in [facility] hospital lab) Nasopharyngeal Nasopharyngeal Swab     Status: None   Collection Time: 08/13/19  6:39 PM   Specimen: Nasopharyngeal Swab  Result Value Ref Range   SARS Coronavirus 2 NEGATIVE NEGATIVE    Comment:  (NOTE) SARS-CoV-2 target nucleic acids are NOT DETECTED. The SARS-CoV-2 RNA is generally detectable in upper and lower respiratory specimens during the acute phase of infection. The lowest concentration of SARS-CoV-2 viral copies this assay can detect is 250 copies / mL. A negative result does not preclude SARS-CoV-2 infection and should not be used as the sole basis for treatment or other patient management decisions.  A negative result may occur with improper specimen collection / handling, submission of specimen other than nasopharyngeal swab, presence of viral mutation(s) within the areas targeted by this assay, and inadequate number of viral copies (<250 copies / mL). A negative result must be combined with clinical observations, patient history, and epidemiological information. Fact Sheet for Patients:   StrictlyIdeas.no Fact Sheet for Healthcare Providers: BankingDealers.co.za This test is not yet approved or cleared  by the [place] FDA and has been authorized for detection and/or diagnosis of SARS-CoV-2 by FDA under an Emergency Use Authorization (EUA).  This EUA will remain in effect (meaning this test can be used) for the duration of the COVID-19 declaration under Section 564(b)(1) of the Act, 21 U.S.C. section 360bbb-3(b)(1), unless the authorization is terminated or revoked sooner. Performed at [facility], [place] [address] [place], [place] [address]   Glucose, capillary     Status: Abnormal   Collection Time: 08/13/19 10:26 PM  Result Value Ref Range   Glucose-Capillary 122 (H) 70 - 99 mg/dL    Comment: Glucose reference range applies only to samples taken after fasting for at least 8 hours.   Comment 1 Notify RN    Comment 2 Document in Chart   Glucose, capillary     Status: Abnormal   Collection Time: 08/14/19  3:43 AM  Result Value Ref Range   Glucose-Capillary 130 (H) 70 - 99 mg/dL    Comment: Glucose  reference range applies only to samples taken after fasting for at least 8 hours.   Comment 1 Notify RN    Comment 2 Document in Chart   Glucose, capillary     Status: Abnormal   Collection Time: 08/14/19  6:46 AM  Result Value Ref Range   Glucose-Capillary 149 (H) 70 - 99 mg/dL    Comment: Glucose reference range applies only to samples taken after fasting for at least 8 hours.   Comment 1 Notify RN    Comment 2 Document in Chart   Glucose, capillary     Status: Abnormal   Collection Time: 08/14/19  7:49 AM  Result Value Ref Range   Glucose-Capillary 152 (  H) 70 - 99 mg/dL    Comment: Glucose reference range applies only to samples taken after fasting for at least 8 hours.  Comprehensive metabolic panel     Status: Abnormal   Collection Time: 08/14/19  8:04 AM  Result Value Ref Range   Sodium 134 (L) 135 - 145 mmol/L   Potassium 4.7 3.5 - 5.1 mmol/L    Comment: SPECIMEN HEMOLYZED. HEMOLYSIS MAY AFFECT INTEGRITY OF RESULTS.   Chloride 103 98 - 111 mmol/L   CO2 22 22 - 32 mmol/L   Glucose, Bld 161 (H) 70 - 99 mg/dL    Comment: Glucose reference range applies only to samples taken after fasting for at least 8 hours.   BUN 16 8 - 23 mg/dL   Creatinine, Ser 2.77 0.61 - 1.24 mg/dL   Calcium 8.2 (L) 8.9 - 10.3 mg/dL   Total Protein 5.5 (L) 6.5 - 8.1 g/dL   Albumin 3.1 (L) 3.5 - 5.0 g/dL   AST 29 15 - 41 U/L   ALT 17 0 - 44 U/L   Alkaline Phosphatase 53 38 - 126 U/L   Total Bilirubin 1.7 (H) 0.3 - 1.2 mg/dL   GFR calc non Af Amer >60 >60 mL/min   GFR calc Af Amer >60 >60 mL/min   Anion gap 9 5 - 15    Comment: Performed at [facility], [address], [place], [place] [address]    Recent Results (from the past 240 hour(s))  SARS Coronavirus 2 by RT PCR (hospital order, performed in [facility] hospital lab) Nasopharyngeal Nasopharyngeal Swab     Status: None   Collection Time: 08/11/19  7:41 PM   Specimen: Nasopharyngeal Swab  Result Value Ref Range Status   SARS  Coronavirus 2 NEGATIVE NEGATIVE Final    Comment: (NOTE) SARS-CoV-2 target nucleic acids are NOT DETECTED. The SARS-CoV-2 RNA is generally detectable in upper and lower respiratory specimens during the acute phase of infection. The lowest concentration of SARS-CoV-2 viral copies this assay can detect is 250 copies / mL. A negative result does not preclude SARS-CoV-2 infection and should not be used as the sole basis for treatment or other patient management decisions.  A negative result may occur with improper specimen collection / handling, submission of specimen other than nasopharyngeal swab, presence of viral mutation(s) within the areas targeted by this assay, and inadequate number of viral copies (<250 copies / mL). A negative result must be combined with clinical observations, patient history, and epidemiological information. Fact Sheet for Patients:   BoilerBrush.com.cy Fact Sheet for Healthcare Providers: https://pope.com/ This test is not yet approved or cleared  by the [place] FDA and has been authorized for detection and/or diagnosis of SARS-CoV-2 by FDA under an Emergency Use Authorization (EUA).  This EUA will remain in effect (meaning this test can be used) for the duration of the COVID-19 declaration under Section 564(b)(1) of the Act, 21 U.S.C. section 360bbb-3(b)(1), unless the authorization is terminated or revoked sooner. Performed at [facility], [address], [place], [place] [address]   SARS Coronavirus 2 by RT PCR (hospital order, performed in [facility] hospital lab) Nasopharyngeal Nasopharyngeal Swab     Status: None   Collection Time: 08/13/19  6:39 PM   Specimen: Nasopharyngeal Swab  Result Value Ref Range Status   SARS Coronavirus 2 NEGATIVE NEGATIVE Final    Comment: (NOTE) SARS-CoV-2 target nucleic acids are NOT DETECTED. The SARS-CoV-2 RNA is generally detectable in upper and  lower respiratory specimens during the acute phase of infection. The lowest concentration of SARS-CoV-2 viral copies this assay can detect is 250 copies / mL. A negative result does not preclude SARS-CoV-2 infection and should not be used as the sole basis for treatment or other patient management decisions.  A negative result may occur with improper specimen collection / handling, submission of specimen other than nasopharyngeal swab, presence of viral mutation(s) within the areas targeted by this assay, and inadequate number of viral copies (<250 copies / mL). A negative result must be combined with clinical observations, patient history, and epidemiological information. Fact Sheet for Patients:   BoilerBrush.com.cyhttps://www.fda.gov/media/136312/download Fact Sheet for Healthcare Providers: https://pope.com/https://www.fda.gov/media/136313/download This test is not yet approved or cleared  by the [place] FDA and has been authorized for detection and/or diagnosis of SARS-CoV-2 by FDA under an Emergency Use Authorization (EUA).  This EUA will remain in effect (meaning this test can be used) for the duration of the COVID-19 declaration under Section 564(b)(1) of the Act, 21 U.S.C. section 360bbb-3(b)(1), unless the authorization is terminated or revoked sooner. Performed at [facility], [address] [place], [place] [address]     Lipid Panel No results for input(s): CHOL, TRIG, HDL, CHOLHDL, VLDL, LDLCALC in the last 72 hours.  Studies/Results: EEG  Result Date: 08/12/2019 Levert FeinsteinYan, Yijun, MD     08/13/2019  6:21 PM HISTORY: 69 year old male with history of left subdural hematoma, seizure, status post VP shunt, presented with sudden onset language difficulty, confusion since evening of August 17, 2019, TECHNIQUE: This is emergency 16 channel EEG recording for evaluation of sudden onset confusion, it was performed during wakefulness sitting position.  Hyperventilation and photic stimulation were not performed.   There was frequent muscle and eye movement artifact during the recording. Posterior dominant waking rhythm consistent of rhythmic alpha range activity, at right hemisphere, there is apparent asymmetry, left side has diffuse higher amplitude, slower activity, frontal dominant.  In addition, there was also frequent protrusion of rhythmic 2 Hz slowing involving left hemisphere, occasionally spreading to mirror lead at right frontal region.  The longest run of rhythmic slowing of left hemisphere lasting more than 2 pages, there was moderate improvement, less frequent appearance of left hemisphere rhythmic slowing following 1 g of Depacon IV infusion.  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 Levert FeinsteinYijun Yan, M.D. Ph.D. [facility] [address] [place], [place] [address] Phone: [phone] Fax:      [phone]   Overnight EEG with video  Result Date: 08/14/2019 Charlsie QuestYadav, Priyanka O, MD     08/14/2019  8:51 AM Patient Name: [name] Shutteronald G Bryngelson Jr. MRN: [id number] Epilepsy Attending: Charlsie QuestPriyanka O Yadav Referring Physician/Provider: Felicie Mornavid Smith, PA Duration: 08/13/2019 1741 to 08/14/2019 0850 Patient history: 69 year old male with history of TBI resulting in subdural hemorrhage and subarachnoid hemorrhage status post VP shunt and resultant NPH who presented with 48 hours of worsening mentation, decreased ability to express himself and impaired ability to follow commands.  EEG to evaluate for seizures. Level of alertness: Awake, asleep AEDs during EEG study: Keppra, Depakote Technical aspects: This EEG study was done with scalp electrodes positioned according to the 10-20 International system of electrode placement. Electrical activity was acquired at a sampling rate of 500Hz  and reviewed with a high frequency filter of 70Hz   and a low frequency filter of  1Hz . EEG data were recorded continuously and digitally stored. Description: The posterior dominant rhythm consists of 89 Hz activity of moderate voltage (25-35 uV) seen predominantly in posterior head regions, asymmetric ( L<R) and reactive to eye opening and eye closing. Sleep was characterized by vertex waves, sleep spindles (12 to 14 Hz), maximal frontocentral region.  EEG showed continuous rhythmic delta activity in left hemisphere, maximal left frontotemporal region.  Hyperventilation and photic stimulation were not performed.   ABNORMALITY -Lateralized rhythmic delta activity, left hemisphere, maximal left frontotemporal region IMPRESSION: This study is suggestive of cortical dysfunction in the left hemisphere, maximal left frontotemporal region due to underlying structural abnormality.  Of note, lateralized rhythmic delta activity is also ictal-interictal continuum and indicates potential epileptogenicity therefore clinical correlation is advised.  Additionally, this can also be seen in postictal state.  No definite seizures were seen throughout the recording. Priyanka    Medications:  Scheduled: . atorvastatin  80 mg Oral QPM  . insulin aspart  0-15 Units Subcutaneous TID WC  . insulin aspart  0-5 Units Subcutaneous QHS  . tamsulosin  0.4 mg Oral QPC supper   Continuous: . sodium chloride 75 mL/hr (08/14/19 0926)  . levETIRAcetam 1,000 mg (08/14/19 0427)  . valproate sodium 500 mg (08/14/19 0929)   LTM EEG, 08/13/2019 1741 to 08/14/2019 0850:  This study is suggestive of cortical dysfunction in the left hemisphere, maximal left frontotemporal region due to underlying structural abnormality.  Of note, lateralized rhythmic delta activity is also ictal-interictal continuum and indicates potential epileptogenicity therefore clinical correlation is advised.  Additionally, this can also be seen in postictal state.  No definite seizures were seen throughout the  recording.  Assessment: 69 year-old male with history of TBI resulting in SDH and SAH, s/p treatment with ventriculoperitoneal shunt for resultant NPH, who presents with a 48-hour history of worsening mentation, decreased ability to express himself and impaired ability to follow commands.  1. Exam shows impaired cognition and language function best localizable to the left perisylvian cortices.  2. Imaging has shown no etiology for the patient's symptoms, ventricular size being normal without evidence for shunt malfunction and also no evidence for acute stroke.   3. Patient received a 2 g loading dose of Keppra on 08/12/2019 along with a one time dose of valproic acid at the Neurology clinic. He initially improved in the clinic and was sent home, but today was noted by his wife to have worsened. Thus, the patient was sent to the ED.   4. LTM EEG shows left hemispheric rhythmic delta activity, which is on the ictal-interictal spectrum. This further increases the likelihood of an epileptic etiology for his presentation.  5. Patient improved since yesterday, but still with significant confusion and receptive/expressive dysphasia. The lateralized rhythmic delta activity seen on EEG is on the ictal-interictal continuum and indicates potential epileptogenicity.    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 10/12/2019 starting AM yesterday) -- Increasing Depakote to 750 mg TID IV (ordered) -- Valproic acid level (ordered) -- Continue LTM EEG -- Inpatient seizure precautions.  -- Ammonia level (ordered)   LOS: 1 day   @Electronically  signed: Dr. Terrace Arabia 08/14/2019  11:22 AM

## 2019-08-14 NOTE — Procedures (Addendum)
Patient Name: [name] Frank.  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  EEG to evaluate for seizures.  Level of alertness: Awake, asleep  AEDs during EEG study: Keppra, Depakote  Technical aspects: This EEG study was done with scalp electrodes positioned according to the 10-20 International system of electrode placement. Electrical activity was acquired at a sampling rate of 500Hz  and reviewed with a high frequency filter of 70Hz  and a low frequency filter of 1Hz . EEG data were recorded continuously and digitally stored.   Description: The posterior dominant rhythm consists of 89 Hz activity of moderate voltage (25-35 uV) seen predominantly in posterior head regions, asymmetric ( L<R) and reactive to eye opening and eye closing. Sleep was characterized by vertex waves, sleep spindles (12 to 14 Hz), maximal frontocentral region.  EEG showed continuous rhythmic delta activity in left hemisphere, maximal left frontotemporal region.  Hyperventilation and photic stimulation were not performed.     ABNORMALITY -Lateralized rhythmic delta activity, left hemisphere, maximal left frontotemporal region   IMPRESSION: This study is suggestive of cortical dysfunction in the left hemisphere, maximal left frontotemporal region due to underlying structural abnormality.  Of note, lateralized rhythmic delta activity is also ictal-interictal continuum and indicates potential epileptogenicity therefore clinical correlation is advised.  Additionally, this can also be seen in postictal state.  No definite seizures were seen throughout  the recording.     Amran Malter 

## 2019-08-14 NOTE — Progress Notes (Addendum)
PROGRESS NOTE    [name] Frank.  LOV:[id number] DOB: 1950/08/06 DOA: 08/13/2019 PCP: Pearline Cables, MD   Brief Narrative: Patient is a 69 year old male with history of subdural/subarachnoid hematoma, status epilepticus: disorder, history of hydrocephalus status post VP shunt on 9/20, hypertension, proximal A. fib, uncontrolled type 2 diabetes mellitus, coronary disease status post stent who presented with confusion, word finding difficulty. Patient sustained fall in May 2020 resulting in subdural and subarachnoid hematoma. He initially did well but subsequently developed more confusion, memory loss, garbled speech and was thought to have complex partial seizures and was started on Keppra he was following with neurology as an outpatient. He also underwent some placement after he was found to have hydrocephalus on MRI on 09/11/2018. He was recently seen in ED for aphasia, word finding difficulties but was discharged to home after MRI was negative. He was found to have abnormal EEG concerning for partial status epilepticus as an outpatient while being followed at [facility] neurology. He appeared more confused. Sent to the emergency department. Neurology consulted and following. Started on antiepileptics and long-term EEG.  Assessment & Plan:   Principal Problem:   Complex partial status epilepticus (HCC) Active Problems:   Hypertension, essential   Coronary artery disease involving native heart without angina pectoris   Paroxysmal atrial fibrillation (HCC)   Bradycardia   Uncontrolled type 2 diabetes mellitus (HCC)   Acute encephalopathy/partial status epilepticus: Neurology consulted and following. On Depakote, Keppra. Placed on LTM. Continue seizure precaution. Ammonia level normal.  Bradycardia: Asymptomatic. Metoprolol on hold  Uncontrolled type 2 diabetes mellitus: Hemoglobin A1c of 10.5. Continue sliding scale insulin. On glipizide and Metformin at home. Might need insulin on  discharge.. Monitor CBGs. Diabetic coordinator following.  Paroxysmal A. fib: On anticoagulation due to history of SDH/SAH. On metoprolol for rate control which is on hold due to bradycardia  Coronary artery disease: Status post stent. Continue statin. No longer on aspirin due to history of SDH/SAH  Hypertension: Currently blood pressure stable.             DVT prophylaxis:SCD Code Status: Full Family Communication: Wife on phone on 08/14/19 Status is: Inpatient  Remains inpatient appropriate because:Ongoing diagnostic testing needed not appropriate for outpatient work up   Dispo: The patient is from: Home              Anticipated d/c is to: Home              Anticipated d/c date is: 1 day              Patient currently is not medically stable to d/c.     Consultants: Neurology  Procedures: LTM  Antimicrobials:  Anti-infectives (From admission, onward)   None      Subjective: Patient seen and examined at the bedside this morning.  Hemodynamically stable.  On LTM EEG.  Very sleepy but easily arousable.  He was oriented during my conversation and obeys commands.  Tells that he is feeling very weak.  Objective: Vitals:   08/13/19 2330 08/14/19 0000 08/14/19 0330 08/14/19 0530  BP: 127/62  (!) 112/49 (!) 130/49  Pulse: (!) 40  (!) 49   Resp: 19   16  Temp: 98.5 F (36.9 C) 98.3 F (36.8 C) 98.3 F (36.8 C) 98.4 F (36.9 C)  TempSrc: Oral Oral Oral   SpO2: 91%  94% 97%    Intake/Output Summary (Last 24 hours) at 08/14/2019 0816 Last data filed at 08/14/2019 0600 Gross per  24 hour  Intake 1785 ml  Output 301 ml  Net 1484 ml   There were no vitals filed for this visit.  Examination:  General exam: Generalized weakness, obese HEENT:PERRL,Oral mucosa moist, Ear/Nose normal on gross exam Respiratory system: Bilateral equal air entry, normal vesicular breath sounds, no wheezes or crackles  Cardiovascular system: S1 & S2 heard, RRR. No JVD, murmurs, rubs,  gallops or clicks. No pedal edema. Gastrointestinal system: Abdomen is nondistended, soft and nontender. No organomegaly or masses felt. Normal bowel sounds heard. Central nervous system: Alert , wake, oriented to place, person, told the month and day correctly .  Obeys commands, slow speech Extremities: No edema, no clubbing ,no cyanosis. Skin: No rashes, lesions or ulcers,no icterus ,no pallor  Data Reviewed: I have personally reviewed following labs and imaging studies  CBC: Recent Labs  Lab 08/11/19 1922 08/13/19 1451  WBC 4.9 5.7  NEUTROABS 3.1  --   HGB 15.7 15.4  HCT 46.5 46.7  MCV 87.2 87.5  PLT 146* 147*   Basic Metabolic Panel: Recent Labs  Lab 08/11/19 1922 08/12/19 1648 08/13/19 1410  NA 135  --  136  K 4.4  --  4.9  CL 102  --  102  CO2 22  --  20*  GLUCOSE 302* 300* 247*  BUN 18  --  18  CREATININE 0.85  --  0.89  CALCIUM 9.0  --  8.9   GFR: Estimated Creatinine Clearance: 97.4 mL/min (by C-G formula based on SCr of 0.89 mg/dL). Liver Function Tests: Recent Labs  Lab 08/11/19 1922 08/13/19 1410  AST 19 16  ALT 26 19  ALKPHOS 59 59  BILITOT 0.8 1.2  PROT 6.3* 6.7  ALBUMIN 3.7 3.7   No results for input(s): LIPASE, AMYLASE in the last 168 hours. No results for input(s): AMMONIA in the last 168 hours. Coagulation Profile: Recent Labs  Lab 08/11/19 1922  INR 1.0   Cardiac Enzymes: No results for input(s): CKTOTAL, CKMB, CKMBINDEX, TROPONINI in the last 168 hours. BNP (last 3 results) No results for input(s): PROBNP in the last 8760 hours. HbA1C: Recent Labs    08/12/19 1648  HGBA1C 10.5*   CBG: Recent Labs  Lab 08/11/19 2000 08/13/19 2226 08/14/19 0343 08/14/19 0646 08/14/19 0749  GLUCAP 249* 122* 130* 149* 152*   Lipid Profile: No results for input(s): CHOL, HDL, LDLCALC, TRIG, CHOLHDL, LDLDIRECT in the last 72 hours. Thyroid Function Tests: Recent Labs    08/12/19 1648  TSH 1.820   Anemia Panel: No results for input(s):  VITAMINB12, FOLATE, FERRITIN, TIBC, IRON, RETICCTPCT in the last 72 hours. Sepsis Labs: No results for input(s): PROCALCITON, LATICACIDVEN in the last 168 hours.  Recent Results (from the past 240 hour(s))  SARS Coronavirus 2 by RT PCR (hospital order, performed in [facility] hospital lab) Nasopharyngeal Nasopharyngeal Swab     Status: None   Collection Time: 08/11/19  7:41 PM   Specimen: Nasopharyngeal Swab  Result Value Ref Range Status   SARS Coronavirus 2 NEGATIVE NEGATIVE Final    Comment: (NOTE) SARS-CoV-2 target nucleic acids are NOT DETECTED. The SARS-CoV-2 RNA is generally detectable in upper and lower respiratory specimens during the acute phase of infection. The lowest concentration of SARS-CoV-2 viral copies this assay can detect is 250 copies / mL. A negative result does not preclude SARS-CoV-2 infection and should not be used as the sole basis for treatment or other patient management decisions.  A negative result may occur with improper specimen  collection / handling, submission of specimen other than nasopharyngeal swab, presence of viral mutation(s) within the areas targeted by this assay, and inadequate number of viral copies (<250 copies / mL). A negative result must be combined with clinical observations, patient history, and epidemiological information. Fact Sheet for Patients:   StrictlyIdeas.no Fact Sheet for Healthcare Providers: BankingDealers.co.za This test is not yet approved or cleared  by the [place] FDA and has been authorized for detection and/or diagnosis of SARS-CoV-2 by FDA under an Emergency Use Authorization (EUA).  This EUA will remain in effect (meaning this test can be used) for the duration of the COVID-19 declaration under Section 564(b)(1) of the Act, 21 U.S.C. section 360bbb-3(b)(1), unless the authorization is terminated or revoked sooner. Performed at [facility], [place]  [address], [place], [place] [address]   SARS Coronavirus 2 by RT PCR (hospital order, performed in [facility] hospital lab) Nasopharyngeal Nasopharyngeal Swab     Status: None   Collection Time: 08/13/19  6:39 PM   Specimen: Nasopharyngeal Swab  Result Value Ref Range Status   SARS Coronavirus 2 NEGATIVE NEGATIVE Final    Comment: (NOTE) SARS-CoV-2 target nucleic acids are NOT DETECTED. The SARS-CoV-2 RNA is generally detectable in upper and lower respiratory specimens during the acute phase of infection. The lowest concentration of SARS-CoV-2 viral copies this assay can detect is 250 copies / mL. A negative result does not preclude SARS-CoV-2 infection and should not be used as the sole basis for treatment or other patient management decisions.  A negative result may occur with improper specimen collection / handling, submission of specimen other than nasopharyngeal swab, presence of viral mutation(s) within the areas targeted by this assay, and inadequate number of viral copies (<250 copies / mL). A negative result must be combined with clinical observations, patient history, and epidemiological information. Fact Sheet for Patients:   StrictlyIdeas.no Fact Sheet for Healthcare Providers: BankingDealers.co.za This test is not yet approved or cleared  by the [place] FDA and has been authorized for detection and/or diagnosis of SARS-CoV-2 by FDA under an Emergency Use Authorization (EUA).  This EUA will remain in effect (meaning this test can be used) for the duration of the COVID-19 declaration under Section 564(b)(1) of the Act, 21 U.S.C. section 360bbb-3(b)(1), unless the authorization is terminated or revoked sooner. Performed at [facility], [place] [address] [place], [place] [address]          Radiology Studies: EEG  Result Date: 08/12/2019 Marcial Pacas, MD     08/13/2019  6:21 PM HISTORY: 69 year old male with history of  left subdural hematoma, seizure, status post VP shunt, presented with sudden onset language difficulty, confusion since evening of August 17, 2019, TECHNIQUE: This is emergency 16 channel EEG recording for evaluation of sudden onset confusion, it was performed during wakefulness sitting position.  Hyperventilation and photic stimulation were not performed.  There was frequent muscle and eye movement artifact during the recording. Posterior dominant waking rhythm consistent of rhythmic alpha range activity, at right hemisphere, there is apparent asymmetry, left side has diffuse higher amplitude, slower activity, frontal dominant.  In addition, there was also frequent protrusion of rhythmic 2 Hz slowing involving left hemisphere, occasionally spreading to mirror lead at right frontal region.  The longest run of rhythmic slowing of left hemisphere lasting more than 2 pages, there was moderate improvement, less frequent appearance of left hemisphere rhythmic slowing following 1 g of Depacon IV infusion.  CONCLUSION: This is  an abnormal EEG, there is electrodiagnostic evidence of left hemisphere rhythmic slowing, occasionally spreading to adjacent right frontal mirror leads, above findings most to support a diagnosis of partial status epilepticus. Levert Feinstein, M.D. Ph.D. [facility] [address] [place], [place] [address] Phone: [phone] Fax:      [phone]        Scheduled Meds: . atorvastatin  80 mg Oral QPM  . insulin aspart  0-15 Units Subcutaneous TID WC  . insulin aspart  0-5 Units Subcutaneous QHS  . tamsulosin  0.4 mg Oral QPC supper   Continuous Infusions: . sodium chloride 75 mL/hr (08/13/19 1948)  . levETIRAcetam 1,000 mg (08/14/19 0427)  . valproate sodium 500 mg (08/14/19 0209)     LOS: 1 day    Time spent:25 mins, More than 50% of that time was spent in counseling and/or coordination of care.      Burnadette Pop, MD [facility] P6/06/2019, 8:16 AM

## 2019-08-15 LAB — GLUCOSE, CAPILLARY
Glucose-Capillary: 169 mg/dL — ABNORMAL HIGH (ref 70–99)
Glucose-Capillary: 224 mg/dL — ABNORMAL HIGH (ref 70–99)
Glucose-Capillary: 194 mg/dL — ABNORMAL HIGH (ref 70–99)
Glucose-Capillary: 212 mg/dL — ABNORMAL HIGH (ref 70–99)

## 2019-08-15 NOTE — Progress Notes (Signed)
LTM EEG discontinued - no skin breakdown at unhook.   

## 2019-08-15 NOTE — Progress Notes (Signed)
No interaction issues with his current AEDs.   Ulyses Southward, PharmD, BCIDP, AAHIVP, CPP Infectious Disease Pharmacist 08/15/2019 8:22 AM

## 2019-08-15 NOTE — Procedures (Addendum)
Patient Name: [name] Frank.  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  EEG to evaluate for seizures.  Level of alertness: Awake, asleep  AEDs during EEG study: Keppra, Depakote  Technical aspects: This EEG study was done with scalp electrodes positioned according to the 10-20 International system of electrode placement. Electrical activity was acquired at a sampling rate of 500Hz  and reviewed with a high frequency filter of 70Hz  and a low frequency filter of 1Hz . EEG data were recorded continuously and digitally stored.   Description: The posterior dominant rhythm consists of 89 Hz activity of moderate voltage (25-35 uV) seen predominantly in posterior head regions, asymmetric ( L<R) and reactive to eye opening and eye closing. Sleep was characterized by vertex waves, sleep spindles (12 to 14 Hz), maximal frontocentral region. EEG showed intermittent rhythmic delta activity in left hemisphere, generalized and maximal left frontotemporal region. Hyperventilation and photic stimulation were not performed.     ABNORMALITY -Intermittent rhythmic delta activity, generalized and  maximal left frontotemporal region  IMPRESSION: This study is suggestive of mild to moderate diffuse encephalopathy, non specific to etiology. There is also evidence of cortical dysfunction in the left frontotemporal region non specific to etiology.  No definite seizures and epileptiform discharges were seen throughout the recording.  EEG appears to be improving compared to previous day.  Darran Gabay 

## 2019-08-15 NOTE — Progress Notes (Addendum)
Subjective: LTM is running. The patient is drowsy and complains of being tired. NAD. No family at bedside. Oriented to name/month; missed his age and the year. Talked in longer sentences today, but needed to be reoriented to situation a few times. Asked the examiner the same question in 3 different ways about his diagnosis and what has happened.   Objective: Current vital signs: BP (!) 118/59 (BP Location: Left Arm)   Pulse 62   Temp 97.9 F (36.6 C) (Oral)   Resp 18   SpO2 91%  Vital signs in last 24 hours: Temp:  [97.9 F (36.6 C)-98.8 F (37.1 C)] 97.9 F (36.6 C) (06/05 0811) Pulse Rate:  [52-67] 62 (06/05 0811) Resp:  [14-23] 18 (06/05 0811) BP: (109-141)/(58-87) 118/59 (06/05 0811) SpO2:  [91 %-97 %] 91 % (06/05 0811)  Intake/Output from previous day: 06/04 0701 - 06/05 0700 In: 1776.5 [I.V.:1477; IV Piggyback:299.5] Out: 800 [Urine:800] Intake/Output this shift: Total I/O In: -  Out: 1750 [Urine:1750] Nutritional status:  Diet Order            Diet regular Room service appropriate? No; Fluid consistency: Thin  Diet effective now             HEENT: [place]/AT Lungs: Respirations unlabored Ext: Warm and well perfused  Neuro: Mental Status: Patient is awake. Speech is fragmentary at times, but asked questions in complete sentences. Speech is hypophonic and non-dysarthric. Frequent perseveration on earlier-asked questions is noted. Able to follow basic motor commands. Oriented to name and month. Unable to state the year. Speech and cognition improved since yesterday.  Cranial Nerves: II: PERRL. Fixates on examiner's face normally. Can track visually. Ptosis of left eye present III,IV, VI: EOMI.  V: Facial sensation is symmetric to FT VII: No facial droop noted today.   VIII: hearing is intact to voice X: Hypophonic speech XI: Head is midlline XII: tongue is midline without atrophy or fasciculations.  Motor: Moving all extremities antigravity with no drift.  4+/5  bilateral upper extremities, knee extension and knee flexion.  Sensory: Intact to LT x 4.  Deep Tendon Reflexes: 2+ and symmetric brachioradialis bilaterally. 1+ patellae bilaterally  Plantars: Toes are downgoing bilaterally.  Cerebellar: No ataxia with FNF bilaterally. Continues to have difficulty correctly following this command. Slow movements noted. No tremor or asterixis.    Lab Results: Results for orders placed or performed during the hospital encounter of 08/13/19 (from the past 48 hour(s))  Comprehensive metabolic panel     Status: Abnormal   Collection Time: 08/13/19  2:10 PM  Result Value Ref Range   Sodium 136 135 - 145 mmol/L   Potassium 4.9 3.5 - 5.1 mmol/L   Chloride 102 98 - 111 mmol/L   CO2 20 (L) 22 - 32 mmol/L   Glucose, Bld 247 (H) 70 - 99 mg/dL    Comment: Glucose reference range applies only to samples taken after fasting for at least 8 hours.   BUN 18 8 - 23 mg/dL   Creatinine, Ser 0.89 0.61 - 1.24 mg/dL   Calcium 8.9 8.9 - 10.3 mg/dL   Total Protein 6.7 6.5 - 8.1 g/dL   Albumin 3.7 3.5 - 5.0 g/dL   AST 16 15 - 41 U/L   ALT 19 0 - 44 U/L   Alkaline Phosphatase 59 38 - 126 U/L   Total Bilirubin 1.2 0.3 - 1.2 mg/dL   GFR calc non Af Amer >60 >60 mL/min   GFR calc Af Amer >60 >60 mL/min  Anion gap 14 5 - 15    Comment: Performed at [facility], [address] [place], [place] [address]  CBC     Status: Abnormal   Collection Time: 08/13/19  2:51 PM  Result Value Ref Range   WBC 5.7 4.0 - 10.5 K/uL   RBC 5.34 4.22 - 5.81 MIL/uL   Hemoglobin 15.4 13.0 - 17.0 g/dL   HCT 60.446.7 54.039.0 - 98.152.0 %   MCV 87.5 80.0 - 100.0 fL   MCH 28.8 26.0 - 34.0 pg   MCHC 33.0 30.0 - 36.0 g/dL   RDW 19.113.0 47.811.5 - 29.515.5 %   Platelets 147 (L) 150 - 400 K/uL   nRBC 0.0 0.0 - 0.2 %    Comment: Performed at [facility], [address] [place], [place] [address]  SARS Coronavirus 2 by RT PCR (hospital order, performed in [facility] hospital lab) Nasopharyngeal  Nasopharyngeal Swab     Status: None   Collection Time: 08/13/19  6:39 PM   Specimen: Nasopharyngeal Swab  Result Value Ref Range   SARS Coronavirus 2 NEGATIVE NEGATIVE    Comment: (NOTE) SARS-CoV-2 target nucleic acids are NOT DETECTED. The SARS-CoV-2 RNA is generally detectable in upper and lower respiratory specimens during the acute phase of infection. The lowest concentration of SARS-CoV-2 viral copies this assay can detect is 250 copies / mL. A negative result does not preclude SARS-CoV-2 infection and should not be used as the sole basis for treatment or other patient management decisions.  A negative result may occur with improper specimen collection / handling, submission of specimen other than nasopharyngeal swab, presence of viral mutation(s) within the areas targeted by this assay, and inadequate number of viral copies (<250 copies / mL). A negative result must be combined with clinical observations, patient history, and epidemiological information. Fact Sheet for Patients:   BoilerBrush.com.cyhttps://www.fda.gov/media/136312/download Fact Sheet for Healthcare Providers: https://pope.com/https://www.fda.gov/media/136313/download This test is not yet approved or cleared  by the [place] FDA and has been authorized for detection and/or diagnosis of SARS-CoV-2 by FDA under an Emergency Use Authorization (EUA).  This EUA will remain in effect (meaning this test can be used) for the duration of the COVID-19 declaration under Section 564(b)(1) of the Act, 21 U.S.C. section 360bbb-3(b)(1), unless the authorization is terminated or revoked sooner. Performed at [facility], [address] [place], [place] [address]   Glucose, capillary     Status: Abnormal   Collection Time: 08/13/19 10:26 PM  Result Value Ref Range   Glucose-Capillary 122 (H) 70 - 99 mg/dL    Comment: Glucose reference range applies only to samples taken after fasting for at least 8 hours.   Comment 1 Notify RN    Comment 2  Document in Chart   Glucose, capillary     Status: Abnormal   Collection Time: 08/14/19  3:43 AM  Result Value Ref Range   Glucose-Capillary 130 (H) 70 - 99 mg/dL    Comment: Glucose reference range applies only to samples taken after fasting for at least 8 hours.   Comment 1 Notify RN    Comment 2 Document in Chart   Glucose, capillary     Status: Abnormal   Collection Time: 08/14/19  6:46 AM  Result Value Ref Range   Glucose-Capillary 149 (H) 70 - 99 mg/dL    Comment: Glucose reference range applies only to samples taken after fasting for at least 8 hours.   Comment 1 Notify RN  Comment 2 Document in Chart   Glucose, capillary     Status: Abnormal   Collection Time: 08/14/19  7:49 AM  Result Value Ref Range   Glucose-Capillary 152 (H) 70 - 99 mg/dL    Comment: Glucose reference range applies only to samples taken after fasting for at least 8 hours.  Comprehensive metabolic panel     Status: Abnormal   Collection Time: 08/14/19  8:04 AM  Result Value Ref Range   Sodium 134 (L) 135 - 145 mmol/L   Potassium 4.7 3.5 - 5.1 mmol/L    Comment: SPECIMEN HEMOLYZED. HEMOLYSIS MAY AFFECT INTEGRITY OF RESULTS.   Chloride 103 98 - 111 mmol/L   CO2 22 22 - 32 mmol/L   Glucose, Bld 161 (H) 70 - 99 mg/dL    Comment: Glucose reference range applies only to samples taken after fasting for at least 8 hours.   BUN 16 8 - 23 mg/dL   Creatinine, Ser 4.65 0.61 - 1.24 mg/dL   Calcium 8.2 (L) 8.9 - 10.3 mg/dL   Total Protein 5.5 (L) 6.5 - 8.1 g/dL   Albumin 3.1 (L) 3.5 - 5.0 g/dL   AST 29 15 - 41 U/L   ALT 17 0 - 44 U/L   Alkaline Phosphatase 53 38 - 126 U/L   Total Bilirubin 1.7 (H) 0.3 - 1.2 mg/dL   GFR calc non Af Amer >60 >60 mL/min   GFR calc Af Amer >60 >60 mL/min   Anion gap 9 5 - 15    Comment: Performed at [facility], [address], [place], [place] [address]  CBC     Status: Abnormal   Collection Time: 08/14/19 11:15 AM  Result Value Ref Range   WBC 5.4 4.0 - 10.5 K/uL    RBC 5.19 4.22 - 5.81 MIL/uL   Hemoglobin 15.1 13.0 - 17.0 g/dL   HCT 51.7 00.1 - 74.9 %   MCV 85.9 80.0 - 100.0 fL   MCH 29.1 26.0 - 34.0 pg   MCHC 33.9 30.0 - 36.0 g/dL   RDW 44.9 67.5 - 91.6 %   Platelets 123 (L) 150 - 400 K/uL   nRBC 0.0 0.0 - 0.2 %    Comment: Performed at [facility], [address] [place], [place] [address]  Glucose, capillary     Status: Abnormal   Collection Time: 08/14/19 11:41 AM  Result Value Ref Range   Glucose-Capillary 211 (H) 70 - 99 mg/dL    Comment: Glucose reference range applies only to samples taken after fasting for at least 8 hours.  Valproic acid level     Status: None   Collection Time: 08/14/19 11:58 AM  Result Value Ref Range   Valproic Acid Lvl 84 50.0 - 100.0 ug/mL    Comment: Performed at [facility], [address], [place], [place] [address]  Ammonia     Status: None   Collection Time: 08/14/19 11:58 AM  Result Value Ref Range   Ammonia 27 9 - 35 umol/L    Comment: Performed at [facility], [address], [place], [place] [address]  Glucose, capillary     Status: Abnormal   Collection Time: 08/14/19  5:06 PM  Result Value Ref Range   Glucose-Capillary 138 (H) 70 - 99 mg/dL    Comment: Glucose reference range applies only to samples taken after fasting for at least 8 hours.  Glucose, capillary     Status: Abnormal  Collection Time: 08/14/19  9:05 PM  Result Value Ref Range   Glucose-Capillary 195 (H) 70 - 99 mg/dL    Comment: Glucose reference range applies only to samples taken after fasting for at least 8 hours.   Comment 1 Notify RN    Comment 2 Document in Chart   Glucose, capillary     Status: Abnormal   Collection Time: 08/15/19  6:24 AM  Result Value Ref Range   Glucose-Capillary 169 (H) 70 - 99 mg/dL    Comment: Glucose reference range applies only to samples taken after fasting for at least 8 hours.   Comment 1 Notify RN    Comment 2 Document in Chart     Recent Results (from the past 240  hour(s))  SARS Coronavirus 2 by RT PCR (hospital order, performed in [facility] hospital lab) Nasopharyngeal Nasopharyngeal Swab     Status: None   Collection Time: 08/11/19  7:41 PM   Specimen: Nasopharyngeal Swab  Result Value Ref Range Status   SARS Coronavirus 2 NEGATIVE NEGATIVE Final    Comment: (NOTE) SARS-CoV-2 target nucleic acids are NOT DETECTED. The SARS-CoV-2 RNA is generally detectable in upper and lower respiratory specimens during the acute phase of infection. The lowest concentration of SARS-CoV-2 viral copies this assay can detect is 250 copies / mL. A negative result does not preclude SARS-CoV-2 infection and should not be used as the sole basis for treatment or other patient management decisions.  A negative result may occur with improper specimen collection / handling, submission of specimen other than nasopharyngeal swab, presence of viral mutation(s) within the areas targeted by this assay, and inadequate number of viral copies (<250 copies / mL). A negative result must be combined with clinical observations, patient history, and epidemiological information. Fact Sheet for Patients:   BoilerBrush.com.cy Fact Sheet for Healthcare Providers: https://pope.com/ This test is not yet approved or cleared  by the [place] FDA and has been authorized for detection and/or diagnosis of SARS-CoV-2 by FDA under an Emergency Use Authorization (EUA).  This EUA will remain in effect (meaning this test can be used) for the duration of the COVID-19 declaration under Section 564(b)(1) of the Act, 21 U.S.C. section 360bbb-3(b)(1), unless the authorization is terminated or revoked sooner. Performed at [facility], [address], [place], [place] [address]   SARS Coronavirus 2 by RT PCR (hospital order, performed in [facility] hospital lab) Nasopharyngeal Nasopharyngeal Swab     Status: None   Collection Time: 08/13/19   6:39 PM   Specimen: Nasopharyngeal Swab  Result Value Ref Range Status   SARS Coronavirus 2 NEGATIVE NEGATIVE Final    Comment: (NOTE) SARS-CoV-2 target nucleic acids are NOT DETECTED. The SARS-CoV-2 RNA is generally detectable in upper and lower respiratory specimens during the acute phase of infection. The lowest concentration of SARS-CoV-2 viral copies this assay can detect is 250 copies / mL. A negative result does not preclude SARS-CoV-2 infection and should not be used as the sole basis for treatment or other patient management decisions.  A negative result may occur with improper specimen collection / handling, submission of specimen other than nasopharyngeal swab, presence of viral mutation(s) within the areas targeted by this assay, and inadequate number of viral copies (<250 copies / mL). A negative result must be combined with clinical observations, patient history, and epidemiological information. Fact Sheet for Patients:   BoilerBrush.com.cy Fact Sheet for Healthcare Providers: https://pope.com/ This test is not yet approved or cleared  by the [place] and has been authorized for detection and/or diagnosis of SARS-CoV-2 by FDA under an Emergency Use Authorization (EUA).  This EUA will remain in effect (meaning this test can be used) for the duration of the COVID-19 declaration under Section 564(b)(1) of the Act, 21 U.S.C. section 360bbb-3(b)(1), unless the authorization is terminated or revoked sooner. Performed at [facility], [address], [place], [place] [address]     Lipid Panel No results for input(s): CHOL, TRIG, HDL, CHOLHDL, VLDL, LDLCALC in the last 72 hours.  Studies/Results: Overnight EEG with video  Result Date: 08/14/2019 Charlsie Quest, MD     08/14/2019  6:22 PM Patient Name: [name] Frank. 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  EEG to evaluate for seizures. Level of alertness: Awake, asleep AEDs during EEG study: Keppra, Depakote Technical aspects: This EEG study was done with scalp electrodes positioned according to the 10-20 International system of electrode placement. Electrical activity was acquired at a sampling rate of 500Hz  and reviewed with a high frequency filter of 70Hz  and a low frequency filter of 1Hz . EEG data were recorded continuously and digitally stored. Description: The posterior dominant rhythm consists of 89 Hz activity of moderate voltage (25-35 uV) seen predominantly in posterior head regions, asymmetric ( L<R) and reactive to eye opening and eye closing. Sleep was characterized by vertex waves, sleep spindles (12 to 14 Hz), maximal frontocentral region.  EEG showed continuous rhythmic delta activity in left hemisphere, maximal left frontotemporal region.  Hyperventilation and photic stimulation were not performed.   ABNORMALITY -Lateralized rhythmic delta activity, left hemisphere, maximal left frontotemporal region IMPRESSION: This study is suggestive of cortical dysfunction in the left hemisphere, maximal left frontotemporal region due to underlying structural abnormality.  Of note, lateralized rhythmic delta activity is also ictal-interictal continuum and indicates potential epileptogenicity therefore clinical correlation is advised.  Additionally, this can also be seen in postictal state.  No definite seizures were seen throughout the recording. Priyanka    Medications:  Scheduled: . atorvastatin  80 mg Oral QPM  . insulin aspart  0-15 Units Subcutaneous TID WC  .  insulin aspart  0-5 Units Subcutaneous QHS  . tamsulosin  0.4 mg Oral QPC supper   Continuous: . sodium chloride 75 mL/hr (08/15/19 0051)  . levETIRAcetam 1,000 mg (08/15/19 0350)  . valproate sodium 750 mg (08/15/19 0102)    LTM EEG, 08/13/2019 1741 to 08/14/2019 0850:  This study is suggestive of cortical dysfunction in the left hemisphere, maximal left frontotemporal region due to underlying structural abnormality.  Of note, lateralized rhythmic delta activity is also ictal-interictal continuum and indicates potential epileptogenicity therefore clinical correlation is advised.  Additionally, this can also be seen in postictal state.  No definite seizures were seen throughout the recording.  LTM EEG, 08/14/2019 1741 to6/5/20210800: Thisstudy is suggestive of mild to moderate diffuse encephalopathy, non specific to etiology. There is also evidence of cortical dysfunction in the left frontotemporal region, non-specific to etiology.No definite seizures or epileptiform discharges were seen throughout the recording. EEG appears to be improving compared to previous day.   10/14/2019, MSN, NP-C [facility] [phone]  Assessment: 69 year-old male with history of TBI resulting in SDH and SAH, s/p treatment with ventriculoperitoneal shunt for resultant NPH, who presents with a 48-hour history of worsening mentation, decreased ability to express himself and impaired ability to follow commands. He initially presented to his outpatient Neurologist with confusion, where EEG showed seizure activity. The patient received a 2 g loading dose of Keppra along with a one-time dose of valproic acid at the Neurology clinic on 6/2. He initially improved in the clinic and was sent home, but on 6/3 he was noted by his wife to have worsened. Thus, the patient was sent to the ED.   1. Exam on initial presentation to [facility] showed impaired cognition and language function, best localizable to the left  perisylvian cortices. Today (6/5), the patient is again improved relative to the prior day's exam, but still with significant confusion.  2. Imaging has shown no etiology for the patient's symptoms, ventricular size being normal without evidence for shunt malfunction and also no evidence for acute stroke.   3. LTM EEG report from today AM (6/5):Thisstudy is suggestive of mild to moderate diffuse encephalopathy, non specific to etiology. There is also evidence of cortical dysfunction in the left frontotemporal region non specific to etiology.No definite seizures and epileptiform discharges were seen throughout the recording.    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  -- Outpatient seizure precautions: Per [facility] statutes, patients with seizures are not allowed to drive until  they have been seizure-free for six months. Use caution when using heavy equipment or power tools. Avoid working on ladders or at heights. Take showers instead of baths. Ensure the water temperature is not too high on the home water heater. Do not go swimming alone. When caring for infants or small children, sit down when holding, feeding, or changing them to minimize risk of injury to the child in the event you have a seizure. Also, Maintain good sleep hygiene. Avoid alcohol.  Electronically signed: Dr. Caryl Pina

## 2019-08-15 NOTE — Progress Notes (Signed)
PROGRESS NOTE    [name] Frank.  BJS:[id number] DOB: Mar 21, 1950 DOA: 08/13/2019 PCP: Darreld Mclean, MD   Brief Narrative: Patient is a 69 year old male with history of subdural/subarachnoid hematoma, status epilepticus: disorder, history of hydrocephalus status post VP shunt on 9/20, hypertension, proximal A. fib, uncontrolled type 2 diabetes mellitus, coronary disease status post stent who presented with confusion, word finding difficulty. Patient sustained fall in May 2020 resulting in subdural and subarachnoid hematoma. He initially did well but subsequently developed more confusion, memory loss, garbled speech and was thought to have complex partial seizures and was started on Keppra he was following with neurology as an outpatient. He also underwent some placement after he was found to have hydrocephalus on MRI on 09/11/2018. He was recently seen in ED for aphasia, word finding difficulties but was discharged to home after MRI was negative. He was found to have abnormal EEG concerning for partial status epilepticus as an outpatient while being followed at [facility] neurology. He appeared more confused. Sent to the emergency department. Neurology consulted and following. Started on antiepileptics and LTM EEG.  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 On Depakote, Keppra. Placed on LTM. Continue seizure precaution. Ammonia level normal.  EEG showed mild to moderate diffuse encephalopathy, non specific to etiology.Evidence of cortical dysfunction in the left frontotemporal region non specific to etiology.No definite seizures and epileptiform  discharges were seen . Mental status has slightly improved since yesterday and he is more coherent.  Still appears significantly weak.  Bradycardia: Asymptomatic. Metoprolol on hold  Uncontrolled type 2 diabetes mellitus: Hemoglobin A1c of 10.5. Continue sliding scale insulin. On glipizide and Metformin at home. Might need insulin on discharge.. Monitor CBGs. Diabetic coordinator following.  Paroxysmal A. fib: On anticoagulation due to history of SDH/SAH. On metoprolol for rate control which is on hold due to bradycardia  Coronary artery disease: Status post stent. Continue statin. No longer on aspirin due to history of SDH/SAH  Hypertension: Currently blood pressure stable.   Generalized weakness: We will request for physical therapy assessment after he is  discontinued from LTM EEG            DVT prophylaxis:SCD Code Status: Full Family Communication: Wife on phone on 08/14/19 Status is: Inpatient  Remains inpatient appropriate because:Ongoing diagnostic testing needed not appropriate for outpatient work up   Dispo: The patient is from: Home              Anticipated d/c is to: Home              Anticipated d/c date is: 2 days              Patient currently is not medically stable to d/c.     Consultants: Neurology  Procedures: LTM  Antimicrobials:  Anti-infectives (From admission, onward)   None      Subjective: Patient seen and examined the bedside this morning.  Medically stable.  Still hooked up with LTM.  Sleepy/weak.  Opens eyes upon calling his name and communicated.  He says he is very weak.  He is more coherent than yesterday.  He told the month correctly and year.  He said he ate [place]  toast this morning.  Objective: Vitals:   08/15/19 0130 08/15/19 0132 08/15/19 0345 08/15/19 0811  BP:   109/87 (!) 118/59  Pulse:   64 62  Resp:   16 18  Temp:   98.1 F (36.7 C) 97.9 F (36.6 C)  TempSrc:   Oral Oral  SpO2: 92% 95% 96% 91%    Intake/Output  Summary (Last 24 hours) at 08/15/2019 0845 Last data filed at 08/15/2019 8250 Gross per 24 hour  Intake 1776.54 ml  Output 2550 ml  Net -773.46 ml   There were no vitals filed for this visit.  Examination:  General exam: Generalized weakness, obese HEENT:PERRL,Oral mucosa moist, Ear/Nose normal on gross exam,EEG leads on head Respiratory system: Bilateral equal air entry, normal vesicular breath sounds, no wheezes or crackles  Cardiovascular system: S1 & S2 heard, RRR. No JVD, murmurs, rubs, gallops or clicks. No pedal edema. Gastrointestinal system: Abdomen is nondistended, soft and nontender. No organomegaly or masses felt. Normal bowel sounds heard. Central nervous system: Sleepy, oriented to place, person, told the month and year correctly .  Obeys commands, slow speech,very weak Extremities: No edema, no clubbing ,no cyanosis. Skin: No rashes, lesions or ulcers,no icterus ,no pallor  Data Reviewed: I have personally reviewed following labs and imaging studies  CBC: Recent Labs  Lab 08/11/19 1922 08/13/19 1451 08/14/19 1115  WBC 4.9 5.7 5.4  NEUTROABS 3.1  --   --   HGB 15.7 15.4 15.1  HCT 46.5 46.7 44.6  MCV 87.2 87.5 85.9  PLT 146* 147* 123*   Basic Metabolic Panel: Recent Labs  Lab 08/11/19 1922 08/12/19 1648 08/13/19 1410 08/14/19 0804  NA 135  --  136 134*  K 4.4  --  4.9 4.7  CL 102  --  102 103  CO2 22  --  20* 22  GLUCOSE 302* 300* 247* 161*  BUN 18  --  18 16  CREATININE 0.85  --  0.89 0.88  CALCIUM 9.0  --  8.9 8.2*   GFR: Estimated Creatinine Clearance: 98.5 mL/min (by C-G formula based on SCr of 0.88 mg/dL). Liver Function Tests: Recent Labs  Lab 08/11/19 1922 08/13/19 1410 08/14/19 0804  AST 19 16 29   ALT 26 19 17   ALKPHOS 59 59 53  BILITOT 0.8 1.2 1.7*  PROT 6.3* 6.7 5.5*  ALBUMIN 3.7 3.7 3.1*   No results for input(s): LIPASE, AMYLASE in the last 168 hours. Recent Labs  Lab 08/14/19 1158  AMMONIA 27   Coagulation Profile: Recent  Labs  Lab 08/11/19 1922  INR 1.0   Cardiac Enzymes: No results for input(s): CKTOTAL, CKMB, CKMBINDEX, TROPONINI in the last 168 hours. BNP (last 3 results) No results for input(s): PROBNP in the last 8760 hours. HbA1C: Recent Labs    08/12/19 1648  HGBA1C 10.5*   CBG: Recent Labs  Lab 08/14/19 0749 08/14/19 1141 08/14/19 1706 08/14/19 2105 08/15/19 0624  GLUCAP 152* 211* 138* 195* 169*   Lipid Profile: No results for input(s): CHOL, HDL, LDLCALC, TRIG, CHOLHDL, LDLDIRECT in the last 72 hours. Thyroid Function Tests: Recent Labs    08/12/19 1648  TSH 1.820   Anemia Panel: No results for input(s): VITAMINB12, FOLATE, FERRITIN, TIBC, IRON, RETICCTPCT in the last 72 hours. Sepsis Labs: No results for input(s): PROCALCITON, LATICACIDVEN in the last 168 hours.  Recent Results (from the past 240 hour(s))  SARS Coronavirus 2 by RT PCR (hospital order, performed in [facility] hospital lab) Nasopharyngeal Nasopharyngeal Swab  Status: None   Collection Time: 08/11/19  7:41 PM   Specimen: Nasopharyngeal Swab  Result Value Ref Range Status   SARS Coronavirus 2 NEGATIVE NEGATIVE Final    Comment: (NOTE) SARS-CoV-2 target nucleic acids are NOT DETECTED. The SARS-CoV-2 RNA is generally detectable in upper and lower respiratory specimens during the acute phase of infection. The lowest concentration of SARS-CoV-2 viral copies this assay can detect is 250 copies / mL. A negative result does not preclude SARS-CoV-2 infection and should not be used as the sole basis for treatment or other patient management decisions.  A negative result may occur with improper specimen collection / handling, submission of specimen other than nasopharyngeal swab, presence of viral mutation(s) within the areas targeted by this assay, and inadequate number of viral copies (<250 copies / mL). A negative result must be combined with clinical observations, patient history, and epidemiological  information. Fact Sheet for Patients:   BoilerBrush.com.cy Fact Sheet for Healthcare Providers: https://pope.com/ This test is not yet approved or cleared  by the [place] FDA and has been authorized for detection and/or diagnosis of SARS-CoV-2 by FDA under an Emergency Use Authorization (EUA).  This EUA will remain in effect (meaning this test can be used) for the duration of the COVID-19 declaration under Section 564(b)(1) of the Act, 21 U.S.C. section 360bbb-3(b)(1), unless the authorization is terminated or revoked sooner. Performed at [facility], [address], [place], [place] [address]   SARS Coronavirus 2 by RT PCR (hospital order, performed in [facility] hospital lab) Nasopharyngeal Nasopharyngeal Swab     Status: None   Collection Time: 08/13/19  6:39 PM   Specimen: Nasopharyngeal Swab  Result Value Ref Range Status   SARS Coronavirus 2 NEGATIVE NEGATIVE Final    Comment: (NOTE) SARS-CoV-2 target nucleic acids are NOT DETECTED. The SARS-CoV-2 RNA is generally detectable in upper and lower respiratory specimens during the acute phase of infection. The lowest concentration of SARS-CoV-2 viral copies this assay can detect is 250 copies / mL. A negative result does not preclude SARS-CoV-2 infection and should not be used as the sole basis for treatment or other patient management decisions.  A negative result may occur with improper specimen collection / handling, submission of specimen other than nasopharyngeal swab, presence of viral mutation(s) within the areas targeted by this assay, and inadequate number of viral copies (<250 copies / mL). A negative result must be combined with clinical observations, patient history, and epidemiological information. Fact Sheet for Patients:   BoilerBrush.com.cy Fact Sheet for Healthcare Providers: https://pope.com/ This test is  not yet approved or cleared  by the [place] FDA and has been authorized for detection and/or diagnosis of SARS-CoV-2 by FDA under an Emergency Use Authorization (EUA).  This EUA will remain in effect (meaning this test can be used) for the duration of the COVID-19 declaration under Section 564(b)(1) of the Act, 21 U.S.C. section 360bbb-3(b)(1), unless the authorization is terminated or revoked sooner. Performed at [facility], [address] [place], [place] [address]          Radiology Studies: Overnight EEG with video  Result Date: 08/14/2019 Charlsie Quest, MD     08/14/2019  6:22 PM Patient Name: Lupita Shutter. 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  EEG to evaluate for seizures. Level of alertness: Awake, asleep AEDs during EEG study: Keppra, Depakote Technical aspects: This EEG study was done with scalp electrodes positioned according to the 10-20 International system of electrode placement. Electrical activity was acquired at a sampling rate of 500Hz  and reviewed with a high frequency filter of 70Hz  and a low frequency filter of 1Hz . EEG data were recorded continuously and digitally stored. Description: The posterior dominant rhythm consists of 89 Hz activity of moderate voltage (25-35 uV) seen predominantly in posterior head regions, asymmetric ( L<R) and reactive to eye opening and eye closing. Sleep was characterized by vertex waves, sleep spindles (12 to 14 Hz), maximal frontocentral region.  EEG showed continuous rhythmic delta activity in left hemisphere, maximal left frontotemporal region.   Hyperventilation and photic stimulation were not performed.   ABNORMALITY -Lateralized rhythmic delta activity, left hemisphere, maximal left frontotemporal region IMPRESSION: This study is suggestive of cortical dysfunction in the left hemisphere, maximal left frontotemporal region due to underlying structural abnormality.  Of note, lateralized rhythmic delta activity is also ictal-interictal continuum and indicates potential epileptogenicity therefore clinical correlation is advised.  Additionally, this can also be seen in postictal state.  No definite seizures were seen throughout the recording. Priyanka        Scheduled Meds: . atorvastatin  80 mg Oral QPM  . insulin aspart  0-15 Units Subcutaneous TID WC  . insulin aspart  0-5 Units Subcutaneous QHS  . tamsulosin  0.4 mg Oral QPC supper   Continuous Infusions: . sodium chloride 75 mL/hr (08/15/19 0051)  . levETIRAcetam 1,000 mg (08/15/19 0350)  . valproate sodium 750 mg (08/15/19 0102)     LOS: 2 days    Time spent:25 mins, More than 50% of that time was spent in counseling and/or coordination of care.      10/15/19, MD [facility] P6/07/2019, 8:45 AM

## 2019-08-16 LAB — GLUCOSE, CAPILLARY
Glucose-Capillary: 148 mg/dL — ABNORMAL HIGH (ref 70–99)
Glucose-Capillary: 253 mg/dL — ABNORMAL HIGH (ref 70–99)
Glucose-Capillary: 241 mg/dL — ABNORMAL HIGH (ref 70–99)
Glucose-Capillary: 216 mg/dL — ABNORMAL HIGH (ref 70–99)

## 2019-08-16 NOTE — Progress Notes (Signed)
PROGRESS NOTE    [name] Frank.  ELF:[id number] DOB: 12/21/1950 DOA: 08/13/2019 PCP: Pearline Cables, MD   Brief Narrative: Patient is a 69 year old male with history of subdural/subarachnoid hematoma, status epilepticus: disorder, history of hydrocephalus status post VP shunt on 9/20, hypertension, proximal A. fib, uncontrolled type 2 diabetes mellitus, coronary disease status post stent who presented with confusion, word finding difficulty. Patient sustained fall in May 2020 resulting in subdural and subarachnoid hematoma. He initially did well but subsequently developed more confusion, memory loss, garbled speech and was thought to have complex partial seizures and was started on Keppra he was following with neurology as an outpatient. He also underwent some placement after he was found to have hydrocephalus on MRI on 09/11/2018. He was recently seen in ED for aphasia, word finding difficulties but was discharged to home after MRI was negative. He was found to have abnormal EEG concerning for partial status epilepticus as an outpatient while being followed at [facility] neurology. He appeared more confused. Sent to the emergency department. Neurology consulted and following. Started on antiepileptics and LTM EEG. Now LTM EEG is discontinued.He is more alert , awake and oriented.  Assessment & Plan:   Principal Problem:   Complex partial status epilepticus (HCC) Active Problems:   Hypertension, essential   Coronary artery disease involving native heart without angina pectoris   Paroxysmal atrial fibrillation (HCC)   Bradycardia   Uncontrolled type 2 diabetes mellitus (HCC)   Acute encephalopathy/partial status epilepticus: Neurology  consulted and following. On Depakote, Keppra.He was placed  on LTM now off. Continue seizure precaution. Ammonia level normal.  EEG showed mild to moderate diffuse encephalopathy, non specific to etiology.Evidence of cortical dysfunction in the left  frontotemporal region non specific to etiology.No definite seizures and epileptiform discharges were seen . Mental status has significantly improved since yesterday and is currently alert and oriented.  Complains of some hallucination.   The explanation for his earlier confusion/unresponsiveness is suspected to be from  postictal state versus nonconvulsive status epilepticus .  Bradycardia: Asymptomatic. Metoprolol on hold.  Might resume on discharge  Uncontrolled type 2 diabetes mellitus: Hemoglobin A1c of 10.5. Continue sliding scale insulin. On glipizide and Metformin at home. Might need insulin on discharge.Monitor CBGs. Diabetic coordinator following.  If he is not discharged on insulin, will increase the dose of Metformin.  Paroxysmal A. fib: On anticoagulation due to history of SDH/SAH. On metoprolol for rate control which is on hold due to bradycardia  Coronary artery disease: Status post stent. Continue statin. No longer on aspirin due to history of SDH/SAH  Hypertension: Currently blood pressure stable.   Generalized weakness: We have requested  for physical therapy assessment.           DVT prophylaxis:SCD Code Status: Full Family Communication: Wife at bedside on 08/14/19 Status is: Inpatient  Remains inpatient appropriate because:Ongoing diagnostic testing needed not appropriate for outpatient work up   Dispo: The patient is from: Home              Anticipated d/c is to: Home              Anticipated d/c date is: 1 days              Patient currently is not medically stable to d/c.  Needs to be cleared by neurology before discharge.  Also waiting for PT evaluation     Consultants: Neurology  Procedures: LTM  Antimicrobials:  Anti-infectives (From admission, onward)  None      Subjective: Patient seen and examined at the bedside this morning.  Hemodynamically stable.  He looks much more alert and oriented today.,  Negative.  Complains of generalized  weakness and hallucination earlier this morning.  Objective: Vitals:   08/15/19 2308 08/16/19 0321 08/16/19 0720 08/16/19 1110  BP: 124/70 128/60 118/66 140/64  Pulse: 60 (!) 53 61 71  Resp: 13 18 16 20   Temp: 98 F (36.7 C) 98.5 F (36.9 C) 98 F (36.7 C) 98.9 F (37.2 C)  TempSrc: Oral Oral Axillary Oral  SpO2: 94% 94% 94% 94%    Intake/Output Summary (Last 24 hours) at 08/16/2019 1129 Last data filed at 08/16/2019 1110 Gross per 24 hour  Intake 480 ml  Output 2900 ml  Net -2420 ml   There were no vitals filed for this visit.  Examination:  General exam: Appears calm and comfortable ,Not in distress, generalized weakness  HEENT:PERRL,Oral mucosa moist, Ear/Nose normal on gross exam Respiratory system: Bilateral equal air entry, normal vesicular breath sounds, no wheezes or crackles  Cardiovascular system: S1 & S2 heard, RRR. No JVD, murmurs, rubs, gallops or clicks. Gastrointestinal system: Abdomen is nondistended, soft and nontender. No organomegaly or masses felt. Normal bowel sounds heard. Central nervous system: Alert and oriented. Extremities: No edema, no clubbing ,no cyanosis Skin: No rashes, lesions or ulcers,no icterus ,no pallor    Data Reviewed: I have personally reviewed following labs and imaging studies  CBC: Recent Labs  Lab 08/11/19 1922 08/13/19 1451 08/14/19 1115  WBC 4.9 5.7 5.4  NEUTROABS 3.1  --   --   HGB 15.7 15.4 15.1  HCT 46.5 46.7 44.6  MCV 87.2 87.5 85.9  PLT 146* 147* 123*   Basic Metabolic Panel: Recent Labs  Lab 08/11/19 1922 08/12/19 1648 08/13/19 1410 08/14/19 0804  NA 135  --  136 134*  K 4.4  --  4.9 4.7  CL 102  --  102 103  CO2 22  --  20* 22  GLUCOSE 302* 300* 247* 161*  BUN 18  --  18 16  CREATININE 0.85  --  0.89 0.88  CALCIUM 9.0  --  8.9 8.2*   GFR: Estimated Creatinine Clearance: 98.5 mL/min (by C-G formula based on SCr of 0.88 mg/dL). Liver Function Tests: Recent Labs  Lab 08/11/19 1922 08/13/19 1410  08/14/19 0804  AST 19 16 29   ALT 26 19 17   ALKPHOS 59 59 53  BILITOT 0.8 1.2 1.7*  PROT 6.3* 6.7 5.5*  ALBUMIN 3.7 3.7 3.1*   No results for input(s): LIPASE, AMYLASE in the last 168 hours. Recent Labs  Lab 08/14/19 1158  AMMONIA 27   Coagulation Profile: Recent Labs  Lab 08/11/19 1922  INR 1.0   Cardiac Enzymes: No results for input(s): CKTOTAL, CKMB, CKMBINDEX, TROPONINI in the last 168 hours. BNP (last 3 results) No results for input(s): PROBNP in the last 8760 hours. HbA1C: No results for input(s): HGBA1C in the last 72 hours. CBG: Recent Labs  Lab 08/15/19 1219 08/15/19 1621 08/15/19 2150 08/16/19 0611 08/16/19 1108  GLUCAP 224* 194* 212* 148* 253*   Lipid Profile: No results for input(s): CHOL, HDL, LDLCALC, TRIG, CHOLHDL, LDLDIRECT in the last 72 hours. Thyroid Function Tests: No results for input(s): TSH, T4TOTAL, FREET4, T3FREE, THYROIDAB in the last 72 hours. Anemia Panel: No results for input(s): VITAMINB12, FOLATE, FERRITIN, TIBC, IRON, RETICCTPCT in the last 72 hours. Sepsis Labs: No results for input(s): PROCALCITON, LATICACIDVEN in the last  168 hours.  Recent Results (from the past 240 hour(s))  SARS Coronavirus 2 by RT PCR (hospital order, performed in [facility] hospital lab) Nasopharyngeal Nasopharyngeal Swab     Status: None   Collection Time: 08/11/19  7:41 PM   Specimen: Nasopharyngeal Swab  Result Value Ref Range Status   SARS Coronavirus 2 NEGATIVE NEGATIVE Final    Comment: (NOTE) SARS-CoV-2 target nucleic acids are NOT DETECTED. The SARS-CoV-2 RNA is generally detectable in upper and lower respiratory specimens during the acute phase of infection. The lowest concentration of SARS-CoV-2 viral copies this assay can detect is 250 copies / mL. A negative result does not preclude SARS-CoV-2 infection and should not be used as the sole basis for treatment or other patient management decisions.  A negative result may occur with improper  specimen collection / handling, submission of specimen other than nasopharyngeal swab, presence of viral mutation(s) within the areas targeted by this assay, and inadequate number of viral copies (<250 copies / mL). A negative result must be combined with clinical observations, patient history, and epidemiological information. Fact Sheet for Patients:   BoilerBrush.com.cy Fact Sheet for Healthcare Providers: https://pope.com/ This test is not yet approved or cleared  by the [place] FDA and has been authorized for detection and/or diagnosis of SARS-CoV-2 by FDA under an Emergency Use Authorization (EUA).  This EUA will remain in effect (meaning this test can be used) for the duration of the COVID-19 declaration under Section 564(b)(1) of the Act, 21 U.S.C. section 360bbb-3(b)(1), unless the authorization is terminated or revoked sooner. Performed at [facility], [address], [place], [place] [address]   SARS Coronavirus 2 by RT PCR (hospital order, performed in [facility] hospital lab) Nasopharyngeal Nasopharyngeal Swab     Status: None   Collection Time: 08/13/19  6:39 PM   Specimen: Nasopharyngeal Swab  Result Value Ref Range Status   SARS Coronavirus 2 NEGATIVE NEGATIVE Final    Comment: (NOTE) SARS-CoV-2 target nucleic acids are NOT DETECTED. The SARS-CoV-2 RNA is generally detectable in upper and lower respiratory specimens during the acute phase of infection. The lowest concentration of SARS-CoV-2 viral copies this assay can detect is 250 copies / mL. A negative result does not preclude SARS-CoV-2 infection and should not be used as the sole basis for treatment or other patient management decisions.  A negative result may occur with improper specimen collection / handling, submission of specimen other than nasopharyngeal swab, presence of viral mutation(s) within the areas targeted by this assay, and inadequate  number of viral copies (<250 copies / mL). A negative result must be combined with clinical observations, patient history, and epidemiological information. Fact Sheet for Patients:   BoilerBrush.com.cy Fact Sheet for Healthcare Providers: https://pope.com/ This test is not yet approved or cleared  by the [place] FDA and has been authorized for detection and/or diagnosis of SARS-CoV-2 by FDA under an Emergency Use Authorization (EUA).  This EUA will remain in effect (meaning this test can be used) for the duration of the COVID-19 declaration under Section 564(b)(1) of the Act, 21 U.S.C. section 360bbb-3(b)(1), unless the authorization is terminated or revoked sooner. Performed at [facility], [address], [place], [place] [address]          Radiology Studies: No results found.      Scheduled Meds: . atorvastatin  80 mg Oral QPM  . insulin aspart  0-15 Units Subcutaneous TID WC  . insulin aspart  0-5 Units  Subcutaneous QHS  . tamsulosin  0.4 mg Oral QPC supper   Continuous Infusions: . levETIRAcetam 1,000 mg (08/16/19 0351)  . valproate sodium 750 mg (08/16/19 0940)     LOS: 3 days    Time spent:25 mins, More than 50% of that time was spent in counseling and/or coordination of care.      Shelly Coss, MD [facility] P6/08/2019, 11:29 AM

## 2019-08-17 LAB — GLUCOSE, CAPILLARY
Glucose-Capillary: 227 mg/dL — ABNORMAL HIGH (ref 70–99)
Glucose-Capillary: 174 mg/dL — ABNORMAL HIGH (ref 70–99)
Glucose-Capillary: 236 mg/dL — ABNORMAL HIGH (ref 70–99)

## 2019-08-17 MED ORDER — LEVETIRACETAM 500 MG PO TABS
1000.0000 mg | ORAL_TABLET | Freq: Two times a day (BID) | ORAL | Status: DC
  Administered 2019-08-17: 1000 mg via ORAL
  Filled 2019-08-17: qty 2

## 2019-08-17 MED ORDER — DIVALPROEX SODIUM 500 MG PO DR TAB: 1000.0000 mg | DELAYED_RELEASE_TABLET | Freq: Two times a day (BID) | ORAL | 1 refills | Status: DC

## 2019-08-17 MED ORDER — METFORMIN HCL 1000 MG PO TABS: 1000.0000 mg | ORAL_TABLET | Freq: Two times a day (BID) | ORAL | 1 refills | Status: DC

## 2019-08-17 MED ORDER — METOPROLOL SUCCINATE ER 25 MG PO TB24
25.0000 mg | ORAL_TABLET | Freq: Every day | ORAL | 1 refills | Status: DC
Start: 2019-08-17 — End: 2020-07-14

## 2019-08-17 MED ORDER — LEVETIRACETAM 1000 MG PO TABS: 1000.0000 mg | ORAL_TABLET | Freq: Two times a day (BID) | ORAL | 1 refills | Status: DC

## 2019-08-17 MED ORDER — DIVALPROEX SODIUM 250 MG PO DR TAB
1000.0000 mg | DELAYED_RELEASE_TABLET | Freq: Two times a day (BID) | ORAL | Status: DC
  Administered 2019-08-17: 1000 mg via ORAL
  Filled 2019-08-17: qty 4

## 2019-08-17 NOTE — Evaluation (Signed)
Physical Therapy Evaluation Patient Details Name: [name] Frank. MRN: [id number] DOB: Oct 26, 1950 Today's Date: 08/17/2019   History of Present Illness  69 year old male with history of subdural/subarachnoid hematoma, status epilepticus disorder, hydrocephalus s/p VP shunt, hypertension, A. fib, uncontrolled type 2 diabetes mellitus, coronary disease status post stent who presented 08/13/19 with confusion, word finding difficulty. EEG encephalopathy, nonspecific etiology. Neurology states partial complex seizures with dense post-ictal state  Clinical Impression   Pt admitted with above diagnosis. Patient required +1 moderate assist to sit EOB and +1 min assist to stand-pivot to chair. Limited by pt's report of dizziness with SBP decr 132 to 122 (DBP 67). No family present to confirm patient's prior functional status and availability of caregivers.  Pt currently with functional limitations due to the deficits listed below (see PT Problem List).Anticipate pt will benefit from skilled PT to increase their independence and safety with mobility to allow discharge to the venue listed below.       Follow Up Recommendations Home health PT;Supervision/Assistance - 24 hour (if family cannot provide 24/7 assist, will need to update plan)    Equipment Recommendations  None recommended by PT    Recommendations for Other Services OT consult     Precautions / Restrictions Precautions Precautions: Other (comment) Precaution Comments: monitor BP Restrictions Weight Bearing Restrictions: No      Mobility  Bed Mobility Overal bed mobility: Needs Assistance Bed Mobility: Supine to Sit     Supine to sit: Mod assist;HOB elevated     General bed mobility comments: poor problem-solving in his attempts pt kick legs off EOB first and then sit up  Transfers Overall transfer level: Needs assistance Equipment used: 1 person hand held assist Transfers: Stand Pivot Transfers   Stand pivot transfers: Min  assist       General transfer comment: pt reported dizziness in sitting with SBP dropping 10 mmHg; dizziness improved and assisted to stand and pivot to chair (could not get BP in standing for safety reasons)  Ambulation/Gait             General Gait Details: deferred due to dizziness and drop in BP  Stairs            Wheelchair Mobility    Modified Rankin (Stroke Patients Only)       Balance Overall balance assessment: Needs assistance Sitting-balance support: No upper extremity supported;Feet supported Sitting balance-Leahy Scale: Fair     Standing balance support: No upper extremity supported Standing balance-Leahy Scale: Poor Standing balance comment: self-selects very wide BOS and not fully upright (trunk flexed)                             Pertinent Vitals/Pain Pain Assessment: No/denies pain    Home Living Family/patient expects to be discharged to:: Private residence Living Arrangements: Spouse/significant other Available Help at Discharge: Family;Available 24 hours/day Type of Home: House Home Access: Stairs to enter Entrance Stairs-Rails: Can reach both Entrance Stairs-Number of Steps: 5 Home Layout: Multi-level;Able to live on main level with bedroom/bathroom Home Equipment: Gilford Rile - 2 wheels;Walker - 4 wheels;Bedside commode;Shower seat - built in Additional Comments: all information from previous admission (11/2018) due to pt confusion    Prior Function Level of Independence: Needs assistance   Gait / Transfers Assistance Needed: per previous record, using RW and assist; today pt reports no device and independent           Hand Dominance  Dominant Hand: Right    Extremity/Trunk Assessment   Upper Extremity Assessment Upper Extremity Assessment: Generalized weakness    Lower Extremity Assessment Lower Extremity Assessment: Generalized weakness    Cervical / Trunk Assessment Cervical / Trunk Assessment: Other  exceptions Cervical / Trunk Exceptions: overweight  Communication   Communication: Expressive difficulties  Cognition Arousal/Alertness: Awake/alert Behavior During Therapy: WFL for tasks assessed/performed Overall Cognitive Status: No family/caregiver present to determine baseline cognitive functioning                                 General Comments: oriented to person, DOB, place; not month or situation      General Comments      Exercises     Assessment/Plan    PT Assessment Patient needs continued PT services  PT Problem List Decreased strength;Decreased activity tolerance;Decreased balance;Decreased mobility;Decreased cognition;Decreased knowledge of use of DME;Decreased safety awareness;Cardiopulmonary status limiting activity;Obesity       PT Treatment Interventions DME instruction;Gait training;Stair training;Functional mobility training;Therapeutic activities;Therapeutic exercise;Balance training;Neuromuscular re-education;Cognitive remediation;Patient/family education    PT Goals (Current goals can be found in the Care Plan section)  Acute Rehab PT Goals Patient Stated Goal: get better PT Goal Formulation: With patient Time For Goal Achievement: 08/31/19 Potential to Achieve Goals: Good    Frequency Min 3X/week   Barriers to discharge Decreased caregiver support unknown if wife can provide 24/7    Co-evaluation               AM-PAC PT "6 Clicks" Mobility  Outcome Measure Help needed turning from your back to your side while in a flat bed without using bedrails?: A Lot Help needed moving from lying on your back to sitting on the side of a flat bed without using bedrails?: A Lot Help needed moving to and from a bed to a chair (including a wheelchair)?: A Little Help needed standing up from a chair using your arms (e.g., wheelchair or bedside chair)?: A Little Help needed to walk in hospital room?: Total Help needed climbing 3-5 steps with  a railing? : Total 6 Click Score: 12    End of Session Equipment Utilized During Treatment: Gait belt Activity Tolerance: Treatment limited secondary to medical complications (Comment)(dizziness, SBP drop) Patient left: in chair;with call bell/phone within reach;with chair alarm set;with nursing/sitter in room Nurse Communication: Mobility status;Other (comment)(drop in BP and dizziness) PT Visit Diagnosis: Other symptoms and signs involving the nervous system (R29.898);Dizziness and giddiness (R42);Difficulty in walking, not elsewhere classified (R26.2)    Time: 7001-7494 PT Time Calculation (min) (ACUTE ONLY): 29 min   Charges:   PT Evaluation $PT Eval Moderate Complexity: 1 Mod PT Treatments $Therapeutic Activity: 8-22 mins         Jerolyn Center, PT Pager [phone]   Zena Amos 08/17/2019, 12:43 PM

## 2019-08-17 NOTE — Plan of Care (Signed)
Adequate for discharge.

## 2019-08-17 NOTE — Progress Notes (Signed)
Inpatient Diabetes Program Recommendations  AACE/ADA: New Consensus Statement on Inpatient Glycemic Control (2015)  Target Ranges:  Prepandial:   less than 140 mg/dL      Peak postprandial:   less than 180 mg/dL (1-2 hours)      Critically ill patients:  140 - 180 mg/dL   Lab Results  Component Value Date   GLUCAP 236 (H) 08/17/2019   HGBA1C 10.5 (H) 08/12/2019    Saw and spoke with pt again today to reinforce A1c and glucose control at home. Told pt to allow his wife to help him and he needed to check his glucose at least 1 time a day. Told pt his A1c again. Discussed A1c goals.  Pt seemed unaware he had seizure activity and that he cannot drive for 6 months. Pt reports lightheaded visual disturbances. Pt also reported not being able to concentrate. Reported this to pt RN.   Pt had questions about the freestyle libre device to check glucose as he is not wanting to do fingersticks. Pt will have to check with insurance sine he is not on insulin for coverage of the [place] device.  Thanks,  Christena Deem RN, MSN, BC-ADM Inpatient Diabetes Coordinator Team Pager [phone] (8a-5p)

## 2019-08-17 NOTE — Progress Notes (Signed)
Patient and wife advised, verbally and written, about  driving restrictions for 6 months at time of discharge

## 2019-08-17 NOTE — Discharge Instructions (Addendum)
Call insurance regarding the [facility] 2 device to check glucose instead of fingersticks to see if it can be covered by insurance.   Your Diabetes level (A1c) was 10.5%. goal is to be at 7% or less (an average of 150 when checking glucose all the time).  -Per [facility] statutes, patients with seizures are not allowed to drive until  they have been seizure-free for six months. Use caution when using heavy equipment or power tools. Avoid working on ladders or at heights. Take showers instead of baths. Ensure the water temperature is not too high on the home water heater. Do not go swimming alone. When caring for infants or small children, sit down when holding, feeding, or changing them to minimize risk of injury to the child in the event you have a seizure.

## 2019-08-17 NOTE — TOC Transition Note (Signed)
Transition of Care [facility]) - CM/SW Discharge Note   Patient Details  Name: [name] Frank. MRN: [id number] Date of Birth: November 23, 1950  Transition of Care [facility]) CM/SW Contact:  [name], RN Phone Number: 08/17/2019, 2:36 PM   Clinical Narrative:    Pt is discharging home with [facility] services through [place]. Cory with [facility] accepted the referral.  No DME needs per PT.  CM has updated the wife on the need for 24 hour supervision and she voiced understanding and agreement.  Wife to provide transport home today.   Final next level of care: Home w Home Health Services Barriers to Discharge: No Barriers Identified   Patient Goals and CMS Choice   CMS Medicare.gov Compare Post Acute Care list provided to:: Patient Choice offered to / list presented to : Spouse, Patient  Discharge Placement                       Discharge Plan and Services                          HH Arranged: PT [facility] Agency: [facility] Date [facility] Agency Contacted: 08/17/19   Representative spoke with at [facility] Agency: Kandee Keen  Social Determinants of Health (SDOH) Interventions     Readmission Risk Interventions Readmission Risk Prevention Plan 07/18/2018  Post Dischage Appt Not Complete  Appt Comments PCP refused to set up with RN Case manager; prefers to call pt  Medication Screening Complete  Transportation Screening Complete  Some recent data might be hidden

## 2019-08-17 NOTE — Discharge Summary (Signed)
Physician Discharge Summary  [name] Frank. JXB:[id number] DOB: 1950-03-24 DOA: 08/13/2019  PCP: Pearline Cables, MD  Admit date: 08/13/2019 Discharge date: 08/17/2019  Admitted From: Home Disposition:  Home  Discharge Condition:Stable CODE STATUS:FULL Diet recommendation: Heart Healthy   Brief/Interim Summary: Patient is a 69 year old male with history of subdural/subarachnoid hematoma, status epilepticus: disorder, history of hydrocephalus status post VP shunt on 9/20, hypertension, proximal A. fib, uncontrolled type 2 diabetes mellitus, coronary disease status post stent who presented with confusion, word finding difficulty. Patient sustained fall in May 2020 resulting in subdural and subarachnoid hematoma. He initially did well but subsequently developed more confusion, memory loss, garbled speech and was thought to have complex partial seizures and was started on Keppra he was following with neurology as an outpatient. He also underwent some placement after he was found to have hydrocephalus on MRI on 09/11/2018. He was recently seen in ED for aphasia, word finding difficulties but was discharged to home after MRI was negative. He was found to have abnormal EEG concerning for partial status epilepticus as an outpatient while being followed at [facility] neurology. He appeared more confused. Sent to the emergency department. Neurology consulted and following. Started on antiepileptics and LTM EEG. Now LTM EEG is discontinued.  Mental status has improved and is currently alert and oriented.  He is prolonged confusion was attributed secondary to postictal state.  Neurology cleared him for discharge.  Hemodynamically stable for discharge home today.  Following problems were addressed during his hospitalization:  Acute encephalopathy/partial status epilepticus: Neurology was consulted and following. On Depakote, Keppra.He was placed  on LTM now off. EEG showed mild to moderate diffuse encephalopathy,  non specific to etiology.Evidence ofcortical dysfunction in the left frontotemporal regionnon specific to etiology.No definite seizuresand epileptiform dischargeswere seen . Mental status has significantly improved  and is currently alert and oriented.  Complains of some hallucination.   The explanation for his earlier confusion/unresponsiveness is suspected to be from  postictal state. He should follow-up with his neurologist Dr. Terrace Arabia in 1 to 2 weeks.  Bradycardia: Asymptomatic. He was on toprol at home.  We resumed at lower dose on discharge  Uncontrolled type 2 diabetes mellitus: Hemoglobin A1c of 10.5.On glipizide and Metformin at home. We will increase the dose of Metformin.  Paroxysmal A. fib: On anticoagulation due to history of SDH/SAH. On metoprolol for rate control .  Coronary artery disease: Status post stent. Continue statin. No longer on aspirin due to history of SDH/SAH  Hypertension: Currently blood pressure stable.   Generalized weakness:Physical therapy assessment done ,recommended home health.   Discharge Diagnoses:  Principal Problem:   Complex partial status epilepticus (HCC) Active Problems:   Hypertension, essential   Coronary artery disease involving native heart without angina pectoris   Paroxysmal atrial fibrillation (HCC)   Bradycardia   Uncontrolled type 2 diabetes mellitus [facility])    Discharge Instructions  Discharge Instructions    Diet - low sodium heart healthy   Complete by: As directed    Discharge instructions   Complete by: As directed    1)Please take prescribed medications as instructed. 2)Follow up with your neurologist in 1-2 weeks 3)Follow up with your PCP in a week.  Monitor blood glucose at home.Check hemoglobin A1C in 3 months.   Increase activity slowly   Complete by: As directed      Allergies as of 08/17/2019   No Known Allergies     Medication List    STOP taking these medications  diazepam 5 MG tablet Commonly  known as: Valium     TAKE these medications   acetaminophen 325 MG tablet Commonly known as: TYLENOL Take 2 tablets (650 mg total) by mouth every 6 (six) hours as needed for mild pain.   atorvastatin 80 MG tablet Commonly known as: LIPITOR Take 1 tablet (80 mg total) by mouth daily. What changed: when to take this   divalproex 500 MG DR tablet Commonly known as: DEPAKOTE Take 2 tablets (1,000 mg total) by mouth every 12 (twelve) hours.   glipiZIDE 10 MG 24 hr tablet Commonly known as: GLUCOTROL XL Take 1 tablet (10 mg total) by mouth 2 (two) times daily.   levETIRAcetam 1000 MG tablet Commonly known as: KEPPRA Take 1 tablet (1,000 mg total) by mouth 2 (two) times daily. What changed: medication strength   metFORMIN 1000 MG tablet Commonly known as: GLUCOPHAGE Take 1 tablet (1,000 mg total) by mouth 2 (two) times daily with a meal. What changed:   medication strength  how much to take   metoprolol succinate 50 MG 24 hr tablet Commonly known as: TOPROL-XL Take 1 tablet (50 mg total) by mouth daily. Take with or immediately following a meal.   tamsulosin 0.4 MG Caps capsule Commonly known as: FLOMAX Take 1 capsule (0.4 mg total) by mouth daily after supper.      Follow-up Information    Copland, Gwenlyn Found, MD. Schedule an appointment as soon as possible for a visit in 1 week(s).   Specialty: Family Medicine Contact information: [address] [place] [address] [phone]        Levert Feinstein, MD. Schedule an appointment as soon as possible for a visit in 2 week(s).   Specialty: Neurology Contact information: [address] [place] [address] [phone]          No Known Allergies  Consultations:  Neurology   Procedures/Studies: EEG  Result Date: 08/12/2019 Levert Feinstein, MD     08/13/2019  6:21 PM HISTORY: 69 year old male with history of left subdural hematoma, seizure, status post VP shunt, presented with sudden  onset language difficulty, confusion since evening of August 17, 2019, TECHNIQUE: This is emergency 16 channel EEG recording for evaluation of sudden onset confusion, it was performed during wakefulness sitting position.  Hyperventilation and photic stimulation were not performed.  There was frequent muscle and eye movement artifact during the recording. Posterior dominant waking rhythm consistent of rhythmic alpha range activity, at right hemisphere, there is apparent asymmetry, left side has diffuse higher amplitude, slower activity, frontal dominant.  In addition, there was also frequent protrusion of rhythmic 2 Hz slowing involving left hemisphere, occasionally spreading to mirror lead at right frontal region.  The longest run of rhythmic slowing of left hemisphere lasting more than 2 pages, there was moderate improvement, less frequent appearance of left hemisphere rhythmic slowing following 1 g of Depacon IV infusion.  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 Levert Feinstein, M.D. Ph.D. [facility] [address] [place], [place] [address] Phone: [phone] Fax:      [phone]   CT Angio Head W/Cm &/Or Wo Cm  Result Date: 08/11/2019 CLINICAL DATA:  Altered mental status, unclear cause. Additional history provided: Expressive aphasia. NIH 1. EXAM: CT ANGIOGRAPHY HEAD AND NECK TECHNIQUE: Multidetector CT imaging of the head  and neck was performed using the standard protocol during bolus administration of intravenous contrast. Multiplanar CT image reconstructions and MIPs were obtained to evaluate the vascular anatomy. Carotid stenosis measurements (when applicable) are obtained utilizing NASCET criteria, using the distal internal carotid diameter as the denominator. CONTRAST:  75mL OMNIPAQUE  IOHEXOL 350 MG/ML SOLN COMPARISON:  Noncontrast head CT 03/02/2019, brain MRI 11/15/2018, CT angiogram head/neck 07/28/2018 FINDINGS: CT HEAD FINDINGS Brain: Examination limited by motion artifact greatest at the level of the superior frontal lobes. Stable position of a right frontoparietal approach ventricular catheter which terminates within the right lateral ventricle. The ventricular system has not significantly changed in size or configuration as compared to prior head CT 03/02/2019. There is no acute intracranial hemorrhage. No definite demarcated cortical infarct is identified. No extra-axial fluid collection. No evidence of intracranial mass. No midline shift. Vascular: Reported below. Skull: Normal. Negative for fracture or focal lesion. Sinuses: Mild ethmoid sinus mucosal thickening. No significant mastoid effusion. Orbits: No acute abnormality. Review of the MIP images confirms the above findings CTA NECK FINDINGS Aortic arch: Atherosclerotic plaque within the visualized aortic arch and proximal major branch vessels of the neck. No hemodynamically significant innominate or proximal subclavian artery stenosis. Right carotid system: CCA and ICA patent within the neck without significant stenosis (50% or greater). Mild calcified plaque at the carotid bifurcation and within the proximal ICA. Left carotid system: CCA and ICA patent within the neck without significant stenosis (50% or greater). Mild mixed plaque within the carotid bifurcation and proximal ICA. Vertebral arteries: The right vertebral artery is patent throughout the neck without significant stenosis. 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 There is reconstitution of irregular flow within the V3 and proximal V4 left vertebral artery, which may be secondary to retrograde flow. As before, there are multifocal high-grade stenoses within the V3 and proximal V4 left vertebral artery. Skeleton:  No acute bony abnormality or aggressive osseous lesion. Cervical spondylosis without high-grade bony spinal canal narrowing. Other neck: No neck mass or cervical lymphadenopathy. Upper chest: No consolidation within the imaged lung apices. Calcified granuloma within the right upper lobe. Review of the MIP images confirms the above findings CTA HEAD FINDINGS Anterior circulation: The intracranial internal carotid arteries are patent without significant stenosis. The M1 middle cerebral arteries are patent without significant stenosis. No M2 proximal branch occlusion or high-grade proximal stenosis is identified. The anterior cerebral arteries are patent without high-grade proximal stenosis. No intracranial aneurysm is identified. Posterior circulation: As described, there is reconstitution of regular flow within the V3 and proximal V4 left vertebral artery, possibly due to retrograde flow. Sites of high-grade stenosis within the V3 and proximal V4 left vertebral artery. Flow is demonstrated within the left PICA proximally. The intracranial right vertebral artery is patent without significant stenosis, as is the basilar artery. Predominantly fetal origin of the posterior predominantly fetal origin of the left posterior cerebral artery. The posterior cerebral arteries are patent bilaterally. As before, there is a moderate/severe stenosis within the proximal P2 left posterior cerebral artery (series 15, image 56). The right posterior communicating artery is hypoplastic or absent. Venous sinuses: Within limitations of contrast timing, no convincing thrombus. Anatomic variants: As described Review of the MIP images confirms the above findings IMPRESSION: CT head: 1. Examination limited by motion artifact greatest at the level of the superior frontal lobes. 2. No definite evidence of acute intracranial abnormality. 3. Stable position of a right frontoparietal approach ventricular  catheter. Stable size and configuration of  the ventricular system as compared to prior CT 03/02/2019. 4. Mild ethmoid sinus mucosal thickening. CTA neck: 1. The bilateral common and internal carotid arteries are patent within the neck without significant stenosis. Mild atherosclerotic plaque within the carotid systems as described. 2. 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 There is reconstitution of irregular flow within the V3 and proximal V4 left vertebral artery, which may be secondary to retrograde flow. As before, there are multifocal high-grade stenoses within the V3 and proximal V4 left vertebral artery. 3. The right vertebral artery is patent throughout the neck without significant stenosis. CTA head: 1. Reconstitution of enhancement within the intracranial left vertebral artery as described, which may be due to retrograde flow. Redemonstrated multifocal high-grade stenoses within the V3 and proximal V4 left vertebral artery. The left PICA is patent proximally. 2. Redemonstrated moderate/severe stenosis within the proximal P2 left posterior cerebral artery. 3. No other intracranial high-grade proximal arterial stenosis is identified. Electronically Signed   By: Jackey Loge DO   On: 08/11/2019 21:47   CT Angio Neck W and/or Wo Contrast  Result Date: 08/11/2019 CLINICAL DATA:  Altered mental status, unclear cause. Additional history provided: Expressive aphasia. NIH 1. EXAM: CT ANGIOGRAPHY HEAD AND NECK TECHNIQUE: Multidetector CT imaging of the head and neck was performed using the standard protocol during bolus administration of intravenous contrast. Multiplanar CT image reconstructions and MIPs were obtained to evaluate the vascular anatomy. Carotid stenosis measurements (when applicable) are obtained utilizing NASCET criteria, using the distal internal carotid diameter as the denominator. CONTRAST:  75mL OMNIPAQUE IOHEXOL 350 MG/ML SOLN COMPARISON:  Noncontrast head CT 03/02/2019,  brain MRI 11/15/2018, CT angiogram head/neck 07/28/2018 FINDINGS: CT HEAD FINDINGS Brain: Examination limited by motion artifact greatest at the level of the superior frontal lobes. Stable position of a right frontoparietal approach ventricular catheter which terminates within the right lateral ventricle. The ventricular system has not significantly changed in size or configuration as compared to prior head CT 03/02/2019. There is no acute intracranial hemorrhage. No definite demarcated cortical infarct is identified. No extra-axial fluid collection. No evidence of intracranial mass. No midline shift. Vascular: Reported below. Skull: Normal. Negative for fracture or focal lesion. Sinuses: Mild ethmoid sinus mucosal thickening. No significant mastoid effusion. Orbits: No acute abnormality. Review of the MIP images confirms the above findings CTA NECK FINDINGS Aortic arch: Atherosclerotic plaque within the visualized aortic arch and proximal major branch vessels of the neck. No hemodynamically significant innominate or proximal subclavian artery stenosis. Right carotid system: CCA and ICA patent within the neck without significant stenosis (50% or greater). Mild calcified plaque at the carotid bifurcation and within the proximal ICA. Left carotid system: CCA and ICA patent within the neck without significant stenosis (50% or greater). Mild mixed plaque within the carotid bifurcation and proximal ICA. Vertebral arteries: The right vertebral artery is patent throughout the neck without significant stenosis. 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 There is reconstitution of irregular flow within the V3 and proximal V4 left vertebral artery, which may be secondary to retrograde flow. As before, there are multifocal high-grade stenoses within the V3 and proximal V4 left vertebral artery. Skeleton: No acute bony abnormality or aggressive osseous lesion. Cervical  spondylosis without high-grade bony spinal canal narrowing. Other neck: No neck mass or cervical lymphadenopathy. Upper chest: No consolidation within the imaged lung apices. Calcified granuloma  within the right upper lobe. Review of the MIP images confirms the above findings CTA HEAD FINDINGS Anterior circulation: The intracranial internal carotid arteries are patent without significant stenosis. The M1 middle cerebral arteries are patent without significant stenosis. No M2 proximal branch occlusion or high-grade proximal stenosis is identified. The anterior cerebral arteries are patent without high-grade proximal stenosis. No intracranial aneurysm is identified. Posterior circulation: As described, there is reconstitution of regular flow within the V3 and proximal V4 left vertebral artery, possibly due to retrograde flow. Sites of high-grade stenosis within the V3 and proximal V4 left vertebral artery. Flow is demonstrated within the left PICA proximally. The intracranial right vertebral artery is patent without significant stenosis, as is the basilar artery. Predominantly fetal origin of the posterior predominantly fetal origin of the left posterior cerebral artery. The posterior cerebral arteries are patent bilaterally. As before, there is a moderate/severe stenosis within the proximal P2 left posterior cerebral artery (series 15, image 56). The right posterior communicating artery is hypoplastic or absent. Venous sinuses: Within limitations of contrast timing, no convincing thrombus. Anatomic variants: As described Review of the MIP images confirms the above findings IMPRESSION: CT head: 1. Examination limited by motion artifact greatest at the level of the superior frontal lobes. 2. No definite evidence of acute intracranial abnormality. 3. Stable position of a right frontoparietal approach ventricular catheter. Stable size and configuration of the ventricular system as compared to prior CT 03/02/2019. 4. Mild  ethmoid sinus mucosal thickening. CTA neck: 1. The bilateral common and internal carotid arteries are patent within the neck without significant stenosis. Mild atherosclerotic plaque within the carotid systems as described. 2. 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 There is reconstitution of irregular flow within the V3 and proximal V4 left vertebral artery, which may be secondary to retrograde flow. As before, there are multifocal high-grade stenoses within the V3 and proximal V4 left vertebral artery. 3. The right vertebral artery is patent throughout the neck without significant stenosis. CTA head: 1. Reconstitution of enhancement within the intracranial left vertebral artery as described, which may be due to retrograde flow. Redemonstrated multifocal high-grade stenoses within the V3 and proximal V4 left vertebral artery. The left PICA is patent proximally. 2. Redemonstrated moderate/severe stenosis within the proximal P2 left posterior cerebral artery. 3. No other intracranial high-grade proximal arterial stenosis is identified. Electronically Signed   By: Jackey Loge DO   On: 08/11/2019 21:47   MR BRAIN WO CONTRAST  Result Date: 08/12/2019 CLINICAL DATA:  Initial evaluation for acute expressive aphasia, altered mental status. EXAM: MRI HEAD WITHOUT CONTRAST TECHNIQUE: Multiplanar, multiecho pulse sequences of the brain and surrounding structures were obtained without intravenous contrast. COMPARISON:  Prior CTs from 08/11/2019. FINDINGS: Brain: Susceptibility artifact related to a right-sided shunt catheter, mildly limiting assessment. Generalized age-related cerebral atrophy. No significant cerebral white matter disease for age. No abnormal foci of restricted diffusion to suggest acute or subacute ischemia. Gray-white matter differentiation maintained. No encephalomalacia to suggest chronic cortical infarction. No evidence for acute or chronic  intracranial hemorrhage. Right frontal approach shunt catheter in place with tip terminating near the foramen of [place]ro. Stable ventricular size without hydrocephalus or transependymal flow of CSF. No mass lesion, midline shift, or mass effect. No extra-axial fluid collection. Pituitary gland suprasellar region within normal limits. Midline structures intact. Vascular: Poorly defined flow void within the proximal left V4 segment related to underlying atherosclerotic disease, better seen on prior CTA. Major  intracranial vascular flow voids otherwise maintained. Skull and upper cervical spine: Craniocervical junction within normal limits. Bone marrow signal intensity normal. Susceptibility artifact at the right frontal scalp related to a VP shunt catheter. Scalp soft tissues demonstrate no acute finding. Sinuses/Orbits: Globes and orbital soft tissues demonstrate no acute finding. Patient status post bilateral ocular lens replacement. Paranasal sinuses are largely clear. Trace bilateral mastoid effusions, of doubtful significance. Inner ear structures grossly normal. Other: None. IMPRESSION: 1. No acute intracranial abnormality. 2. 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 No hydrocephalus. Electronically Signed   By: Rise Mu M.D.   On: 08/12/2019 04:14   DG Chest Port 1 View  Result Date: 08/11/2019 CLINICAL DATA:  Altered mental status and chest pain EXAM: PORTABLE CHEST 1 VIEW COMPARISON:  07/18/2018 FINDINGS: Cardiac shadow is enlarged. Aortic calcifications are noted. Ventricular shunt is noted over the right chest wall. The lungs are clear. No bony abnormality is noted. IMPRESSION: No acute abnormality noted. Electronically Signed   By: Alcide Clever M.D.   On: 08/11/2019 22:25   Overnight EEG with video  Result Date: 08/14/2019 Charlsie Quest, MD     08/14/2019  6:22 PM Patient Name: [name] Frank. 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  EEG to evaluate for seizures. Level of alertness: Awake, asleep AEDs during EEG study: Keppra, Depakote Technical aspects: This EEG study was done with scalp electrodes positioned according to the 10-20 International system of electrode placement. Electrical activity was acquired at a sampling rate of 500Hz  and reviewed with a high frequency filter of 70Hz  and a low frequency filter of 1Hz . EEG data were recorded continuously and digitally stored. Description: The posterior dominant rhythm consists of 89 Hz activity of moderate voltage (25-35 uV) seen predominantly in posterior head regions, asymmetric ( L<R) and reactive to eye opening and eye closing. Sleep was characterized by vertex waves, sleep spindles (12 to 14 Hz), maximal frontocentral region.  EEG showed continuous rhythmic delta activity in left hemisphere, maximal left frontotemporal region.  Hyperventilation and photic stimulation were not performed.   ABNORMALITY -Lateralized rhythmic delta activity, left hemisphere, maximal left frontotemporal region IMPRESSION: This study is suggestive of cortical dysfunction in the left hemisphere, maximal left frontotemporal region due to underlying structural abnormality.  Of note, lateralized rhythmic delta activity is also ictal-interictal continuum and indicates potential epileptogenicity therefore clinical correlation is advised.  Additionally, this can also be seen in postictal state.  No definite seizures were seen throughout the recording. Priyanka       Subjective: Patient seen and examined at the  bedside this morning. Hemodynamically  stable for discharge today. Discharge Exam: Vitals:   08/17/19 0920 08/17/19 1307  BP: 122/69   Pulse: (!) 57 (!) 57  Resp:  18  Temp:  98.1 F (36.7 C)  SpO2:  98%   Vitals:   08/17/19 0730 08/17/19 0910 08/17/19 0920 08/17/19 1307  BP: 130/71 132/67 122/69   Pulse: (!) 54 (!) 54 (!) 57 (!) 57  Resp: 18   18  Temp: 97.8 F (36.6 C)   98.1 F (36.7 C)  TempSrc:  Oral   Oral  SpO2: 97%   98%    General: Pt is alert, awake, not in acute distress Cardiovascular: RRR, S1/S2 +, no rubs, no gallops Respiratory: CTA bilaterally, no wheezing, no rhonchi Abdominal: Soft, NT, ND, bowel sounds + Extremities: no edema, no cyanosis    The results of significant diagnostics from this hospitalization (including imaging, microbiology, ancillary and laboratory) are listed below for reference.     Microbiology: Recent Results (from the past 240 hour(s))  SARS Coronavirus 2 by RT PCR (hospital order, performed in [facility] hospital lab) Nasopharyngeal Nasopharyngeal Swab     Status: None   Collection Time: 08/11/19  7:41 PM   Specimen: Nasopharyngeal Swab  Result Value Ref Range Status   SARS Coronavirus 2 NEGATIVE NEGATIVE Final    Comment: (NOTE) SARS-CoV-2 target nucleic acids are NOT DETECTED. The SARS-CoV-2 RNA is generally detectable in upper and lower respiratory specimens during the acute phase of infection. The lowest concentration of SARS-CoV-2 viral copies this assay can detect is 250 copies / mL. A negative result does not preclude SARS-CoV-2 infection and should not be used as the sole basis for treatment or other patient management decisions.  A negative result may occur with improper specimen collection / handling, submission of specimen other than nasopharyngeal swab, presence of viral mutation(s) within the areas targeted by this assay, and inadequate number of viral copies (<250 copies / mL). A negative result must be combined  with clinical observations, patient history, and epidemiological information. Fact Sheet for Patients:   BoilerBrush.com.cy Fact Sheet for Healthcare Providers: https://pope.com/ This test is not yet approved or cleared  by the [place] FDA and has been authorized for detection and/or diagnosis of SARS-CoV-2 by FDA under an Emergency Use Authorization (EUA).  This EUA will remain in effect (meaning this test can be used) for the duration of the COVID-19 declaration under Section 564(b)(1) of the Act, 21 U.S.C. section 360bbb-3(b)(1), unless the authorization is terminated or revoked sooner. Performed at [facility], [address], [place], [place] [address]   SARS Coronavirus 2 by RT PCR (hospital order, performed in [facility] hospital lab) Nasopharyngeal Nasopharyngeal Swab     Status: None   Collection Time: 08/13/19  6:39 PM   Specimen: Nasopharyngeal Swab  Result Value Ref Range Status   SARS Coronavirus 2 NEGATIVE NEGATIVE Final    Comment: (NOTE) SARS-CoV-2 target nucleic acids are NOT DETECTED. The SARS-CoV-2 RNA is generally detectable in upper and lower respiratory specimens during the acute phase of infection. The lowest concentration of SARS-CoV-2 viral copies this assay can detect is 250 copies / mL. A negative result does not preclude SARS-CoV-2 infection and should not be used as the sole basis for treatment or other patient management decisions.  A negative result may occur with improper specimen collection / handling, submission of specimen other than nasopharyngeal swab, presence of viral mutation(s) within the areas targeted by this assay, and inadequate number of viral copies (<250 copies / mL). A negative result must be combined with clinical observations, patient history, and epidemiological information. Fact Sheet for Patients:   BoilerBrush.com.cy Fact Sheet for Healthcare  Providers: https://pope.com/ This test is not yet approved or cleared  by the [place] FDA and has been authorized for detection and/or diagnosis of SARS-CoV-2 by FDA under an Emergency Use Authorization (EUA).  This EUA will remain in effect (meaning this test can be used) for the duration of the COVID-19 declaration under Section 564(b)(1)  of the Act, 21 U.S.C. section 360bbb-3(b)(1), unless the authorization is terminated or revoked sooner. Performed at [facility], [address], [place], [place] [address]      Labs: BNP (last 3 results) No results for input(s): BNP in the last 8760 hours. Basic Metabolic Panel: Recent Labs  Lab 08/11/19 1922 08/12/19 1648 08/13/19 1410 08/14/19 0804  NA 135  --  136 134*  K 4.4  --  4.9 4.7  CL 102  --  102 103  CO2 22  --  20* 22  GLUCOSE 302* 300* 247* 161*  BUN 18  --  18 16  CREATININE 0.85  --  0.89 0.88  CALCIUM 9.0  --  8.9 8.2*   Liver Function Tests: Recent Labs  Lab 08/11/19 1922 08/13/19 1410 08/14/19 0804  AST 19 16 29   ALT 26 19 17   ALKPHOS 59 59 53  BILITOT 0.8 1.2 1.7*  PROT 6.3* 6.7 5.5*  ALBUMIN 3.7 3.7 3.1*   No results for input(s): LIPASE, AMYLASE in the last 168 hours. Recent Labs  Lab 08/14/19 1158  AMMONIA 27   CBC: Recent Labs  Lab 08/11/19 1922 08/13/19 1451 08/14/19 1115  WBC 4.9 5.7 5.4  NEUTROABS 3.1  --   --   HGB 15.7 15.4 15.1  HCT 46.5 46.7 44.6  MCV 87.2 87.5 85.9  PLT 146* 147* 123*   Cardiac Enzymes: No results for input(s): CKTOTAL, CKMB, CKMBINDEX, TROPONINI in the last 168 hours. BNP: Invalid input(s): POCBNP CBG: Recent Labs  Lab 08/16/19 1649 08/16/19 2159 08/17/19 0603 08/17/19 0732 08/17/19 1300  GLUCAP 241* 216* 227* 174* 236*   D-Dimer No results for input(s): DDIMER in the last 72 hours. Hgb A1c No results for input(s): HGBA1C in the last 72 hours. Lipid Profile No results for input(s): CHOL, HDL, LDLCALC, TRIG,  CHOLHDL, LDLDIRECT in the last 72 hours. Thyroid function studies No results for input(s): TSH, T4TOTAL, T3FREE, THYROIDAB in the last 72 hours.  Invalid input(s): FREET3 Anemia work up No results for input(s): VITAMINB12, FOLATE, FERRITIN, TIBC, IRON, RETICCTPCT in the last 72 hours. Urinalysis    Component Value Date/Time   COLORURINE YELLOW 08/11/2019 2216   APPEARANCEUR CLEAR 08/11/2019 2216   LABSPEC >1.046 (H) 08/11/2019 2216   PHURINE 5.0 08/11/2019 2216   GLUCOSEU >=500 (A) 08/11/2019 2216   HGBUR NEGATIVE 08/11/2019 2216   BILIRUBINUR NEGATIVE 08/11/2019 2216   KETONESUR NEGATIVE 08/11/2019 2216   PROTEINUR NEGATIVE 08/11/2019 2216   UROBILINOGEN 0.2 12/02/2006 1058   NITRITE NEGATIVE 08/11/2019 2216   LEUKOCYTESUR NEGATIVE 08/11/2019 2216   Sepsis Labs Invalid input(s): PROCALCITONIN,  WBC,  LACTICIDVEN Microbiology Recent Results (from the past 240 hour(s))  SARS Coronavirus 2 by RT PCR (hospital order, performed in [facility] hospital lab) Nasopharyngeal Nasopharyngeal Swab     Status: None   Collection Time: 08/11/19  7:41 PM   Specimen: Nasopharyngeal Swab  Result Value Ref Range Status   SARS Coronavirus 2 NEGATIVE NEGATIVE Final    Comment: (NOTE) SARS-CoV-2 target nucleic acids are NOT DETECTED. The SARS-CoV-2 RNA is generally detectable in upper and lower respiratory specimens during the acute phase of infection. The lowest concentration of SARS-CoV-2 viral copies this assay can detect is 250 copies / mL. A negative result does not preclude SARS-CoV-2 infection and should not be used as the sole basis for treatment or other patient management decisions.  A negative result may occur with improper specimen collection / handling, submission of specimen other  than nasopharyngeal swab, presence of viral mutation(s) within the areas targeted by this assay, and inadequate number of viral copies (<250 copies / mL). A negative result must be combined with  clinical observations, patient history, and epidemiological information. Fact Sheet for Patients:   StrictlyIdeas.no Fact Sheet for Healthcare Providers: BankingDealers.co.za This test is not yet approved or cleared  by the [place] FDA and has been authorized for detection and/or diagnosis of SARS-CoV-2 by FDA under an Emergency Use Authorization (EUA).  This EUA will remain in effect (meaning this test can be used) for the duration of the COVID-19 declaration under Section 564(b)(1) of the Act, 21 U.S.C. section 360bbb-3(b)(1), unless the authorization is terminated or revoked sooner. Performed at [facility], [place] [address] [place], [place] [address]   SARS Coronavirus 2 by RT PCR (hospital order, performed in [facility] hospital lab) Nasopharyngeal Nasopharyngeal Swab     Status: None   Collection Time: 08/13/19  6:39 PM   Specimen: Nasopharyngeal Swab  Result Value Ref Range Status   SARS Coronavirus 2 NEGATIVE NEGATIVE Final    Comment: (NOTE) SARS-CoV-2 target nucleic acids are NOT DETECTED. The SARS-CoV-2 RNA is generally detectable in upper and lower respiratory specimens during the acute phase of infection. The lowest concentration of SARS-CoV-2 viral copies this assay can detect is 250 copies / mL. A negative result does not preclude SARS-CoV-2 infection and should not be used as the sole basis for treatment or other patient management decisions.  A negative result may occur with improper specimen collection / handling, submission of specimen other than nasopharyngeal swab, presence of viral mutation(s) within the areas targeted by this assay, and inadequate number of viral copies (<250 copies / mL). A negative result must be combined with clinical observations, patient history, and epidemiological information. Fact Sheet for Patients:   StrictlyIdeas.no Fact Sheet for Healthcare  Providers: BankingDealers.co.za This test is not yet approved or cleared  by the [place] FDA and has been authorized for detection and/or diagnosis of SARS-CoV-2 by FDA under an Emergency Use Authorization (EUA).  This EUA will remain in effect (meaning this test can be used) for the duration of the COVID-19 declaration under Section 564(b)(1) of the Act, 21 U.S.C. section 360bbb-3(b)(1), unless the authorization is terminated or revoked sooner. Performed at [facility], [place] [address], [place], [place] [address]     Please note: You were cared for by a hospitalist during your hospital stay. Once you are discharged, your primary care physician will handle any further medical issues. Please note that NO REFILLS for any discharge medications will be authorized once you are discharged, as it is imperative that you return to your primary care physician (or establish a relationship with a primary care physician if you do not have one) for your post hospital discharge needs so that they can reassess your need for medications and monitor your lab values.    Time coordinating discharge: 40 minutes  SIGNED:   Shelly Coss, MD  [facility] 08/17/2019, 1:56 PM Pager [phone]  If 7PM-7AM, please contact night-coverage [email or web address] Password [email or web address]

## 2019-08-17 NOTE — Progress Notes (Addendum)
NEUROLOGY PROGRESS NOTE   Subjective: Patient is doing significantly better than on initial admission.  No complaints.  Sitting in a chair looking at his emails and able to hold a full conversation.  Exam: Vitals:   08/17/19 0356 08/17/19 0730  BP: (!) 148/66 130/71  Pulse: (!) 55 (!) 54  Resp: 20 18  Temp: 98.1 F (36.7 C) 97.8 F (36.6 C)  SpO2: 97% 97%   Neuro:  Mental Status: Patient is alert, oriented to hospital-month-year.  Able to name objects and tell me what they are used for.  Recalls events and seeing myself when he initially came in. Cranial Nerves: II:  Visual fields grossly normal,  III,IV, ptosis present on left eye present, extra-ocular motions intact bilaterally pupils equal, round, reactive to light and accommodation V,VII: smile symmetric, facial light touch sensation normal bilaterally VIII: hearing normal bilaterally IX,X: Palate rises midline XI: bilateral shoulder shrug XII: midline tongue extension Motor: Moving all extremities antigravity Plantars: Right: downgoing   Left: downgoing Cerebellar: normal finger-to-nose    Medications:  Scheduled: . atorvastatin  80 mg Oral QPM  . insulin aspart  0-15 Units Subcutaneous TID WC  . insulin aspart  0-5 Units Subcutaneous QHS  . tamsulosin  0.4 mg Oral QPC supper   Continuous: . levETIRAcetam 1,000 mg (08/17/19 0411)  . 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  Patient initially was seen by Dr. Krista Blue however the next day patients symptoms of confusion and expressive and receptive aphasia continued.  Initially presented to the hospital for further evaluation.  Patient  found to be seizing.  Patient received a 2 g low dose of Keppra along with one-time dose of valproic acid the day prior in the neurological clinic on 6/2.  Imaging did not show any etiology for patient's symptoms, ventricular size being normal without evidence of shunt malfunction or evidence of acute stroke.  Patient's Keppra was increased to 1000 mg twice daily in addition valproic acid 750 mg 3 times daily daily was added to his regime.  However patient stay he is significantly improved.  Exam today shows patient able to hold a conversation and interact with practitioner.   Impression: -Confusion and dysphagia -Initial presentation with partial complex seizures intermittently for 48 hours followed by dense postictal state, versus nonconvulsive status epilepticus.  Recommendations: -Continue Keppra 1000 mg twice daily may switch to p.o. when discharged -Continue Depakote 750 mg 3 times daily to be continued when discharged it may be changed to oral -He will need to follow-up with Dr. Krista Blue as outpatient -Per [facility] statutes, patients with seizures are not allowed to drive until  they have been seizure-free for six months. Use caution when using heavy equipment or power tools. Avoid working on ladders or at heights. Take showers instead of baths. Ensure the water temperature is not too high on the home water heater. Do not go swimming alone. When caring for infants or small children, sit down when holding, feeding, or changing them to minimize risk of injury to the child in the event you have a seizure.   Also, Maintain good sleep hygiene. Avoid alcohol.  08/17/2019, 9:49 AM  I have seen the patient reviewed the above note.  He is  awake, alert, gives the year initially as 2011, but corrects himself.  He is reportedly markedly improved compared to admission, and at this point I would continue his current antiepileptic therapy. I will change IV depacon to depakote 1g BID, and keppra can be  changed to PO as well.   Ritta Slot, MD [facility] [phone]  If 7pm- 7am, please page neurology on call as listed in AMION.

## 2019-08-18 ENCOUNTER — Encounter: Payer: Self-pay | Admitting: Neurology

## 2019-08-18 ENCOUNTER — Telehealth: Payer: Self-pay | Admitting: *Deleted

## 2019-08-18 ENCOUNTER — Telehealth: Payer: Self-pay | Admitting: Neurology

## 2019-08-18 ENCOUNTER — Ambulatory Visit: Payer: Medicare HMO | Admitting: Neurology

## 2019-08-18 VITALS — BP 129/74 | HR 67 | Ht 70.0 in | Wt 220.0 lb

## 2019-08-18 DIAGNOSIS — G919 Hydrocephalus, unspecified: Secondary | ICD-10-CM | POA: Diagnosis not present

## 2019-08-18 DIAGNOSIS — G40211 Localization-related (focal) (partial) symptomatic epilepsy and epileptic syndromes with complex partial seizures, intractable, with status epilepticus: Secondary | ICD-10-CM

## 2019-08-18 DIAGNOSIS — Z982 Presence of cerebrospinal fluid drainage device: Secondary | ICD-10-CM | POA: Diagnosis not present

## 2019-08-18 MED ORDER — DIVALPROEX SODIUM 500 MG PO DR TAB: 1000.0000 mg | DELAYED_RELEASE_TABLET | Freq: Two times a day (BID) | ORAL | 5 refills | Status: DC

## 2019-08-18 MED ORDER — LEVETIRACETAM 1000 MG PO TABS: 1000.0000 mg | ORAL_TABLET | Freq: Two times a day (BID) | ORAL | 1 refills | Status: DC

## 2019-08-18 NOTE — Progress Notes (Signed)
PATIENT: [name] Frank. DOB: Jun 29, 1950  REASON FOR VISIT: follow up HISTORY FROM: patient  HISTORY OF PRESENT ILLNESS: Today 08/18/19  HISTORY [name] Frankis a 69 year old male, seen in request by his primary care physician Dr. Lamar Blinks for evaluation of possible seizure, history of subdural hematoma, initial evaluation was on October 09, 2018.  I have reviewed and summarized the referring note from the referring physician. He had a past medical history of hypertension, type 2 diabetes, coronary artery disease, paroxysmal atrial fibrillation, he had an unwitnessed fall at the bottom of the stairs, was brought to the emergency room on Jul 14, 2018, I personally reviewed CT head, acute subdural hematoma along the left cerebral convexity measuring up to 11 mm in thickness, patchy subarachnoid hemorrhage with a traumatic pattern, negative for cervical spine fracture. 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 He denies gait abnormality, memory loss at that time.  He readmitted to the hospital on Jul 28, 2018 for more somnolent, slept all day on May 16 sudden onset garbled speech, right facial droop, transient confusion, emergency room blood pressure 90/53, CT head showed developing community hydrocephalus, likely due to recent subarachnoid and intraventricular hemorrhage, no surgical intervention was indicated per neurosurgery evaluation, suspicious for complex partial seizure, he was discharged with Keppra 500 mg twice a day  Since recent hospital admission, there was no clinical seizure, but he was noted significant memory loss, he states, repeating questions, slept a  lot, lost control of his urine, also has mild gait abnormality,  He is always a slow walker, he tends to wait on him, by reviewing the initial CT scan on Jul 14, 2018, there is already some evidence of hydrocephalus, enlarged bilateral lateral ventricle,  I personally reviewed MRI of the brain on Jul 29, 2018, persistent ventriculomegaly, suspected developing communicating hydrocephalus, no evidence of acute stroke, 10 mm left subdural hematoma, with no mass-effect on the hemisphere,  Laboratory evaluations showed normal ESR, C-reactive protein, ANA, HIV, RPR, B12, A1c was 7.2, hemoglobin was 12.8,   Update November 19, 2018: He continues to have significant memory loss, gait abnormality, worsening urinary incontinence, this is all new since May 2020, he retired as an Engineer, 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[employer], later worked as a Microbiologist, just finished a complicated project in April 2020, now he has difficulty operating coffee machine, difficulty initiating his gait, fell few times,  We have personally reviewed MRI CT scans, compared with multiple scans he had a since May 2020, most recent MRI brain November 15, 2018: Resolution of left subdural hematoma, ventriculomegaly out of proportion to the extent of mild generalized cortical atrophy, additionally there appears to be mild transependymal flow, 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 Zada Finders, will see him on September 9  He is tolerating Keppra 500 mg twice daily well, there was no recurrent seizure  He has tried Sinemet 25/100 mg 3 times daily, there was no significant improvement noted  UPDATE Oct 6th 2020:  I reviewed operation record by Dr. Zada Finders, he had shunt replacement December 02, 2018.  He was seen by  physical therapy on December 03, 2018, the day postsurgical, patient with significant posterior lean, poor trunk control, required bilateral upper extremity support, needs assistance, he reported overall 60% improvement in his walking, urinary frequency and urgency has much improved, still has mild memory loss  I reviewed CT head December 03, 2018: Operative changes from interval right bur hole craniotomy for VP shunt placement,  UPDATE Mar 17 2019: I personally reviewed CT head on Mar 01 2020:Slight further decrease in lateral ventricle size since October.Stable right superior frontal approach shunt. No adverse features. He is overall doing well, wife reported that he is back to his baseline, he is sedentary, is no longer taking Keppra 500 mg twice daily, we went over the initial event in May 2020, when he had unwitnessed fall at the ground level, but with resulting subdural hematoma, he had no recollection of the fall, possibility including seizure, after discussion, we decided to proceed with Keppra 500 mg twice a day  Lab in Dec 2020: LDL 111, CMP, glucose 299, creat 0.78, A1C 9.5  Update August 12, 2019 SS: He presented to the ER yesterday 08/11/19 with aphasia, word finding difficulties, left-sided facial droop since 5 PM when his wife came home, she reported confusion the past 2 days.  Of recent, had not been taking Keppra, was given Keppra 500 mg tablet.  MRI was negative, discharged home with his wife, still having some word finding difficulty but was alert and oriented. Urine shows > 500 glucose, glucose 302.  MRI of the brain 08/12/19 IMPRESSION: 1. No acute intracranial abnormality. 2. 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 No hydrocephalus.  CT head: 1. Examination limited by motion artifact greatest at the level of the superior frontal lobes. 2. No definite evidence of acute intracranial abnormality. 3.  Stable position of a right frontoparietal approach ventricular catheter. Stable size and configuration of the ventricular system as compared to prior CT 03/02/2019. 4. Mild ethmoid sinus mucosal thickening.  CTA neck: 1. The bilateral common and internal carotid arteries are patent within the neck without significant stenosis. Mild atherosclerotic plaque within the carotid systems as described. 2. 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 There is reconstitution of irregular flow within the V3 and proximal V4 left vertebral artery, which may be secondary to retrograde flow. As before, there are multifocal high-grade stenoses within the V3 and proximal V4 left vertebral artery. 3. The right vertebral artery is patent throughout the neck without significant stenosis.  CTA head: 1. Reconstitution of enhancement within the intracranial left vertebral artery as described, which may be due to retrograde flow. Redemonstrated multifocal high-grade stenoses within the V3 and proximal V4 left vertebral artery. The left PICA is patent proximally. 2. Redemonstrated moderate/severe stenosis within the proximal P2 left posterior cerebral artery. 3. No other intracranial high-grade proximal arterial stenosis is Identified.  Here today with his wife, for the last 48 hours, he has had significant decline, slurred speech, right-sided facial droop, difficulty following commands, trouble with walking.  Today, he had episode of urinary incontinence in exam room. Today, poured coffee on his [place] toast instead of syrup, getting words confused, changing the letter of the first word, his wife last saw him normal on Sunday night, apparently had meeting with financial planner Monday morning, assumes he  was normal.  Has been doing a lot of hand gestures.  He manages his medications, his wife thinks he has been taking Keppra, in hospital, did not  recognize the name Keppra, refers to it as generic.   Update August 18, 2019 SS: When last seen, call to check on the patient next day, still felt to be in partial status epilepticus was sent to ER.  Was discharged yesterday, his prolonged confusion was attributed secondary to postictal state.  He was placed on Depakote 750 mg 3 times daily, switched to oral Depakote 1000 mg twice daily. Keppra level 11.9, TSH 1.820, A1C 10.5.   EEG Dr. Hortense Ramal 08/14/2019 IMPRESSION: This study is suggestive of cortical dysfunction in the left hemisphere, maximal left frontotemporal region due to underlying structural abnormality.  Of note, lateralized rhythmic delta activity is also ictal-interictal continuum and indicates potential epileptogenicity therefore clinical correlation is advised.  Additionally, this can also be seen in postictal state.  No definite seizures were seen throughout the recording.  EEG 08/15/2019 Dr. Hortense Ramal: [name] Frank is suggestive of mild to moderate diffuse encephalopathy, non specific to etiology. There is also evidence of cortical dysfunction in the left frontotemporal region non specific to etiology.No definite seizures and epileptiform discharges were seen throughout the recording. EEG appears to be improving compared to previous day.  Here today for outpatient follow-up, here with his wife.  Yesterday when discharged, mental status had improved, overnight, had BM on himself, also in the shower, unsure if related to higher dose of Metformin.  His communication skills have improved, still has some word hesitancy, he tells me he is trying to think of the word. Wife feels he is 95% better.  Is starting physical therapy this week.  Seeing PCP next week. Still has some gait instability, using rolling walker. Able to correctly identify, his DOB, various objects, repeat phrases well  REVIEW OF SYSTEMS: Out of a complete 14 system review of symptoms, the patient complains only of the following symptoms,  and all other reviewed systems are negative.  Seizures  ALLERGIES: No Known Allergies  HOME MEDICATIONS: Outpatient Medications Prior to Visit  Medication Sig Dispense Refill  . acetaminophen (TYLENOL) 325 MG tablet Take 2 tablets (650 mg total) by mouth every 6 (six) hours as needed for mild pain.    Marland Kitchen atorvastatin (LIPITOR) 80 MG tablet Take 1 tablet (80 mg total) by mouth daily. (Patient taking differently: Take 80 mg by mouth every evening. ) 90 tablet 3  . glipiZIDE (GLUCOTROL XL) 10 MG 24 hr tablet Take 1 tablet (10 mg total) by mouth 2 (two) times daily. 180 tablet 3  . metFORMIN (GLUCOPHAGE) 1000 MG tablet Take 1 tablet (1,000 mg total) by mouth 2 (two) times daily with a meal. 60 tablet 1  . metoprolol succinate (TOPROL XL) 25 MG 24 hr tablet Take 1 tablet (25 mg total) by mouth daily. 30 tablet 1  . tamsulosin (FLOMAX) 0.4 MG CAPS capsule Take 1 capsule (0.4 mg total) by mouth daily after supper. 30 capsule 1  . divalproex (DEPAKOTE) 500 MG DR tablet Take 2 tablets (1,000 mg total) by mouth every 12 (twelve) hours. 60 tablet 1  . levETIRAcetam (KEPPRA) 1000 MG tablet Take 1 tablet (1,000 mg total) by mouth 2 (two) times daily. 60 tablet 1   No facility-administered medications prior to visit.    PAST MEDICAL HISTORY: Past Medical History:  Diagnosis Date  . Abnormality of gait    uses walker and wheelchair  . Cognitive  deficit as late effect of traumatic brain injury ([place])   . Coronary artery disease    cardiologist-- dr s. Jake Bathe---  04-06-2014  NSTEMI  s/p  cardiac cath DES x1 to LAD and POBA to D1  . Dysrhythmia   . History of non-ST elevation myocardial infarction (NSTEMI)    04-06-2013  s/p  coronary stent and PCI  . Hydrocele, bilateral   . Hyperlipemia   . Hypertension   . Lower urinary tract symptoms (LUTS)   . Memory deficit   . PAF (paroxysmal atrial fibrillation) ([place]) cardilogist-- dr Jake Bathe   on-set 05/ 2020 during admission for [facility],  w/ RVR,  with  cardizem/ toprol, back in NSR,  f/u by cardiologist , event monitor 09-16-2018 no results in epic by per pt wife was told result ok with no afib  . S/P drug eluting coronary stent placement    04-06-2014  _0   DES x1 to LAD and POBA to D1  . SAH (subarachnoid hemorrhage) [facility]) neurologist-  dr Krista Blue--- last head CT 10-01-2018 resolving (10-23-2018  residual deficits memory issues, abnormal gait, congnitive   w/ SDH due to unwitnessed fall, traumatic head injury---- admission 07-14-2018 , d/c'd 08-01-2018,  readmitted 3 days later with late TBI effects,  d/c'd from rehab 08-22-2018  . Seizure disorder [facility])    followed by dr Krista Blue  . Subdural hematoma ([place])   . Type 2 diabetes mellitus ([place])    followed by pcp  . 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 CATARACT EXTRACTION W/ INTRAOCULAR LENS  IMPLANT, BILATERAL  2008; 2009  . CORONARY ANGIOPLASTY WITH STENT PLACEMENT  04-06-2014  _1    DES x1 to LAD and POBA to D1  . HYDROCELE EXCISION Bilateral 10/27/2018   Procedure: HYDROCELECTOMY ADULT;  Surgeon: [name], MD;  Location: [facility];  Service: Urology;  Laterality: Bilateral;  . TONSILLECTOMY  child  . 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   Procedure: Right ventriculoperitoneal shunt placement;  Surgeon: Judith Part, MD;  Location: [facility];  Service: Neurosurgery;  Laterality: Right;    FAMILY HISTORY: Family History  Problem Relation Age of Onset  . Stroke Father   . Hypertension Father   . Hyperlipidemia Father   . Early death Mother   . Colitis Sister   . Appendicitis Brother     SOCIAL HISTORY: Social History   Socioeconomic History  . Marital status: Married    Spouse name: Not on file  . Number of children: 2  . Years of education: college  . Highest education level: Master's degree (e.g., MA, MS, MEng, MEd, MSW, MBA)  Occupational History  .  Occupation: Retired  Tobacco Use  . Smoking status: Never Smoker  . Smokeless tobacco: Never Used  Substance and Sexual Activity  . Alcohol use: Not Currently    Comment: VERY RARELY  . Drug use: Never  . Sexual activity: Not on file  Other Topics Concern  . Not on file  Social History Narrative   Lives at home with his wife.   Right-handed.   Limited caffeine use.   Social Determinants of Health   Financial Resource Strain:   . Difficulty of Paying Living Expenses:   Food Insecurity:   . Worried About Charity fundraiser in the Last Year:   . Arboriculturist in the Last Year:   Transportation Needs:   . Lack  of Transportation (Medical):   Marland Kitchen Lack of Transportation (Non-Medical):   Physical Activity:   . Days of Exercise per Week:   . Minutes of Exercise per Session:   Stress:   . Feeling of Stress :   Social Connections:   . Frequency of Communication with Friends and Family:   . Frequency of Social Gatherings with Friends and Family:   . Attends Religious Services:   . Active Member of Clubs or Organizations:   . Attends Archivist Meetings:   Marland Kitchen Marital Status:   Intimate Partner Violence:   . Fear of Current or Ex-Partner:   . Emotionally Abused:   Marland Kitchen Physically Abused:   . Sexually Abused:   PHYSICAL EXAM  Vitals:   08/18/19 1408  BP: 129/74  Pulse: 67  Weight: 220 lb (99.8 kg)  Height: _0  (1.778 m)   Body mass index is 31.57 kg/m.  Generalized: Well developed, in no acute distress, somewhat disheveled Neurological examination  Mentation: Alert, oriented  place, history is supported by his wife. Follows all commands speech and mild hesitancy with language Cranial nerve II-XII: Pupils were equal round reactive to light. Extraocular movements were full, visual field were full on confrontational test. Facial sensation and strength were normal. Head turning and shoulder shrug were normal and symmetric. Motor: The motor testing reveals 5 over 5  strength of all 4 extremities. Good symmetric motor tone is noted throughout.  Sensory: Sensory testing is intact to soft touch on all 4 extremities. No evidence of extinction is noted.  Coordination: Cerebellar testing reveals good finger-nose-finger and heel-to-shin bilaterally.  Gait and station: Has to push off from seated patient to rise, gait is wide based, uses rolling walker Reflexes: Deep tendon reflexes are symmetric and normal bilaterally.   DIAGNOSTIC DATA (LABS, IMAGING, TESTING) - I reviewed patient records, labs, notes, testing and imaging myself where available.  Lab Results  Component Value Date   WBC 5.4 08/14/2019   HGB 15.1 08/14/2019   HCT 44.6 08/14/2019   MCV 85.9 08/14/2019   PLT 123 (L) 08/14/2019      Component Value Date/Time   NA 134 (L) 08/14/2019 0804   K 4.7 08/14/2019 0804   CL 103 08/14/2019 0804   CO2 22 08/14/2019 0804   GLUCOSE 161 (H) 08/14/2019 0804   BUN 16 08/14/2019 0804   CREATININE 0.88 08/14/2019 0804   CALCIUM 8.2 (L) 08/14/2019 0804   PROT 5.5 (L) 08/14/2019 0804   ALBUMIN 3.1 (L) 08/14/2019 0804   AST 29 08/14/2019 0804   ALT 17 08/14/2019 0804   ALKPHOS 53 08/14/2019 0804   BILITOT 1.7 (H) 08/14/2019 0804   GFRNONAA >60 08/14/2019 0804   GFRAA >60 08/14/2019 0804   Lab Results  Component Value Date   CHOL 171 02/19/2019   HDL 34.50 (L) 02/19/2019   LDLCALC 111 (H) 02/19/2019   TRIG 128.0 02/19/2019   CHOLHDL 5 02/19/2019   Lab Results  Component Value Date   HGBA1C 10.5 (H) 08/12/2019   Lab Results  Component Value Date   VITAMINB12 264 10/09/2018   Lab Results  Component Value Date   TSH 1.820 08/12/2019    ASSESSMENT AND PLAN 69 y.o. year old male  has a past medical history of Abnormality of gait, Cognitive deficit as late effect of traumatic brain injury ([place]), Coronary artery disease, Dysrhythmia, History of non-ST elevation myocardial infarction (NSTEMI), Hydrocele, bilateral, Hyperlipemia, Hypertension,  Lower urinary tract symptoms (LUTS), Memory deficit, PAF (paroxysmal atrial  fibrillation) ([place]) (cardilogist-- dr Jake Bathe), S/P drug eluting coronary stent placement, SAH (subarachnoid hemorrhage) [facility]) (neurologist-  dr Krista Blue--- last head CT 10-01-2018 resolving (10-23-2018  residual deficits memory issues, abnormal gait, congnitive), Seizure disorder ([place]), Subdural hematoma ([place]), Type 2 diabetes mellitus ([place]), and Urinary incontinence. here with:  1.  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  History of fall Jul 14, 2018, with left convexity subdural hematoma, subarachnoid hemorrhage 3.  Evidence of normal pressure hydrocephalus 4.  Status post VP shunt placement December 02, 2018 -Mental status greatly improved when last seen in the office 08/12/2019, now only mild hesitancy in speech noted, following commands well -Continue Keppra 1000 mg twice a day -Continue Depakote DR 500 mg, 2 tablets twice a day -Check Depakote, Keppra, CBC, CMP today -Starting physical therapy this week  -See PCP next week for follow up on diabetes, A1c was 10.5, Metformin was doubled -Follow-up in 3 months with Dr. Krista Blue or sooner if needed  I spent 30 minutes of face-to-face and non-face-to-face time with patient. This included previsit chart review, lab review, study review, order entry, electronic health record documentation, patient education.  Butler Denmark, AGNP-C, DNP 08/18/2019, 2:57 PM [facility] [address], [place], [place] [address] [phone]

## 2019-08-18 NOTE — Telephone Encounter (Signed)
[name] Frank, he has recently been discharged, can you give him follow-up appointment with me in 1-2 weeks, or Dr. Terrace Arabia is she has something sooner?

## 2019-08-18 NOTE — Telephone Encounter (Signed)
1st attempt. Unable to reach patient.   

## 2019-08-18 NOTE — Telephone Encounter (Signed)
I did not see message (or read it clearly) skimming messages.  I saw this 1330.  SS/NP ok to see.

## 2019-08-18 NOTE — Patient Instructions (Signed)
Continue current medications Check blood work today  Keep close check on diabetes See primary doctor next week  Call for seizures See you back in 3 months

## 2019-08-19 DIAGNOSIS — I252 Old myocardial infarction: Secondary | ICD-10-CM | POA: Diagnosis not present

## 2019-08-19 DIAGNOSIS — G912 (Idiopathic) normal pressure hydrocephalus: Secondary | ICD-10-CM | POA: Diagnosis not present

## 2019-08-19 DIAGNOSIS — E119 Type 2 diabetes mellitus without complications: Secondary | ICD-10-CM | POA: Diagnosis not present

## 2019-08-19 DIAGNOSIS — I480 Paroxysmal atrial fibrillation: Secondary | ICD-10-CM | POA: Diagnosis not present

## 2019-08-19 DIAGNOSIS — R4701 Aphasia: Secondary | ICD-10-CM | POA: Diagnosis not present

## 2019-08-19 DIAGNOSIS — I119 Hypertensive heart disease without heart failure: Secondary | ICD-10-CM | POA: Diagnosis not present

## 2019-08-19 DIAGNOSIS — G40911 Epilepsy, unspecified, intractable, with status epilepticus: Secondary | ICD-10-CM | POA: Diagnosis not present

## 2019-08-19 DIAGNOSIS — I2510 Atherosclerotic heart disease of native coronary artery without angina pectoris: Secondary | ICD-10-CM | POA: Diagnosis not present

## 2019-08-19 DIAGNOSIS — G40209 Localization-related (focal) (partial) symptomatic epilepsy and epileptic syndromes with complex partial seizures, not intractable, without status epilepticus: Secondary | ICD-10-CM | POA: Diagnosis not present

## 2019-08-19 DIAGNOSIS — R29810 Facial weakness: Secondary | ICD-10-CM | POA: Diagnosis not present

## 2019-08-19 NOTE — Telephone Encounter (Signed)
I have made two attempts and have been unable to reach patient. Pt has 6 month follow up scheduled w/ PCP 08/24/19. This could be switched to [facility] hospital follow up if needed.

## 2019-08-20 ENCOUNTER — Ambulatory Visit: Payer: Medicare HMO | Admitting: Family Medicine

## 2019-08-21 DIAGNOSIS — S065X9A Traumatic subdural hemorrhage with loss of consciousness of unspecified duration, initial encounter: Secondary | ICD-10-CM | POA: Diagnosis not present

## 2019-08-21 LAB — CBC WITH DIFFERENTIAL/PLATELET
Basophils Absolute: 0 10*3/uL (ref 0.0–0.2)
Basos: 1 %
EOS (ABSOLUTE): 0.1 10*3/uL (ref 0.0–0.4)
Eos: 2 %
Hematocrit: 46.4 % (ref 37.5–51.0)
Hemoglobin: 15.4 g/dL (ref 13.0–17.7)
Immature Grans (Abs): 0 10*3/uL (ref 0.0–0.1)
Immature Granulocytes: 0 %
Lymphocytes Absolute: 0.7 10*3/uL (ref 0.7–3.1)
Lymphs: 16 %
MCH: 29.4 pg (ref 26.6–33.0)
MCHC: 33.2 g/dL (ref 31.5–35.7)
MCV: 89 fL (ref 79–97)
Monocytes Absolute: 0.4 10*3/uL (ref 0.1–0.9)
Monocytes: 9 %
Neutrophils Absolute: 3.1 10*3/uL (ref 1.4–7.0)
Neutrophils: 72 %
Platelets: 165 10*3/uL (ref 150–450)
RBC: 5.24 x10E6/uL (ref 4.14–5.80)
RDW: 12.6 % (ref 11.6–15.4)
WBC: 4.3 10*3/uL (ref 3.4–10.8)

## 2019-08-21 LAB — COMPREHENSIVE METABOLIC PANEL
ALT: 26 IU/L (ref 0–44)
AST: 24 IU/L (ref 0–40)
Albumin/Globulin Ratio: 1.7 (ref 1.2–2.2)
Albumin: 3.9 g/dL (ref 3.8–4.8)
Alkaline Phosphatase: 79 IU/L (ref 48–121)
BUN/Creatinine Ratio: 24 (ref 10–24)
BUN: 20 mg/dL (ref 8–27)
Bilirubin Total: 0.4 mg/dL (ref 0.0–1.2)
CO2: 23 mmol/L (ref 20–29)
Calcium: 9.2 mg/dL (ref 8.6–10.2)
Chloride: 98 mmol/L (ref 96–106)
Creatinine, Ser: 0.85 mg/dL (ref 0.76–1.27)
GFR calc Af Amer: 105 mL/min/{1.73_m2} (ref >59)
GFR calc non Af Amer: 91 mL/min/{1.73_m2} (ref >59)
Globulin, Total: 2.3 g/dL (ref 1.5–4.5)
Glucose: 279 mg/dL — ABNORMAL HIGH (ref 65–99)
Potassium: 4.1 mmol/L (ref 3.5–5.2)
Sodium: 138 mmol/L (ref 134–144)
Total Protein: 6.2 g/dL (ref 6.0–8.5)

## 2019-08-21 LAB — LEVETIRACETAM LEVEL: Levetiracetam Lvl: 30.8 ug/mL (ref 10.0–40.0)

## 2019-08-21 LAB — VALPROIC ACID LEVEL: Valproic Acid Lvl: 66 ug/mL (ref 50–100)

## 2019-08-22 NOTE — Progress Notes (Signed)
[facility] [address], [address] [place], [place] [address] [phone]  Date:  08/24/2019   Name:  Maximino Cozzolino.   DOB:  15-Oct-1950   MRN:  [id number]  PCP:  Pearline Cables, MD    Chief Complaint: Hospitalization Follow-up   History of Present Illness:  Safal Halderman. is a 69 y.o. very pleasant male patient who presents with the following:  Here today for a follow-up visit and also hospital follow-up Patient last seen by myself in December 2020 History of diabetes, hypertension, renal insufficiency He fell in May 2020, sustained a TBI and subdural hematoma.  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 fib, uncontrolled type 2 diabetes mellitus, coronary disease status post stent who presented with confusion, word finding difficulty. Patient sustained fall in May 2020 resulting in subdural and subarachnoid hematoma. He initially did well but subsequently developed more confusion, memory loss, garbled speech and was thought to have complex partial seizures and was started on Keppra he was following with neurology as an outpatient. He also underwent some placement after he was found to have hydrocephalus on MRI on 09/11/2018. He was recently seen in ED for aphasia, word finding difficulties but was discharged to home after MRI was negative. He was found to have abnormal EEG concerning for partial  status epilepticus as an outpatient while being followed at [facility] neurology. He appeared more confused. Sent to the emergency department. Neurology consulted and following. Started on antiepileptics and LTM EEG. Now LTM EEG is discontinued.  Mental status has improved and is currently alert and oriented.  He is prolonged confusion was attributed secondary to postictal state.  Neurology cleared him for discharge.  Hemodynamically stable for discharge home today.  Following problems were addressed during his hospitalization:  Acute encephalopathy/partial status epilepticus: Neurology wasconsulted and following. On Depakote, Keppra.He was placedon LTM now off. EEG showed mild to moderate diffuse encephalopathy, non specific to etiology.Evidence ofcortical dysfunction in the left frontotemporal regionnon specific to etiology.No definite seizuresand epileptiform dischargeswere seen . Mental status hassignificantlyimproved  andis currently alert and oriented. Complains of some hallucination.  Theexplanation for hisearlierconfusion/unresponsiveness issuspected to be frompostictal state. He should follow-up with his neurologist Dr. Terrace Arabia in 1 to 2 weeks. Bradycardia:Asymptomatic. He was on toprol at home.We resumed at lower dose on discharge Uncontrolled type 2 diabetes mellitus: Hemoglobin A1c of 10.5.On glipizide and Metformin at home. We will increase the dose of Metformin. Paroxysmal A. fib:On anticoagulation due to history of SDH/SAH. On metoprolol for rate control . Coronary artery disease: Status post stent. Continue statin. No longer on aspirin due to history of SDH/SAH Hypertension: Currently blood pressure stable.  Generalized weakness:Physical therapy assessment done ,recommended home health.  Discharge Diagnoses:  Principal Problem:   Complex partial status epilepticus (HCC) Active Problems:   Hypertension, essential   Coronary artery disease involving native  heart without angina pectoris   Paroxysmal atrial fibrillation (HCC)   Bradycardia   Uncontrolled type  2 diabetes mellitus ([place])  Covid series is complete His A1c was high during admission, Metformin was adjusted I had contacted Timmothy Sours about his A1c and need for medication adjustment last December, but he did not see the MyChart message.  He admits he does not really use MyChart, I will try to disable his account  functionality so as to avoid confusion He was seen for neurology follow-up last week, June 8.  They had him continue Keppra and Depakote  His wife notes that his mental status seems to be improved but not back to his prior baseline as of yet  Pt feels like he is overall ok.  He is able to dress and groom himself.  He is still having difficulty with stairs  He does not generally check his blood sugar at home He does have a meter at home He is not getting a lot of exercise right now, but he does feel like he is able to get out and walk some   His wife notes that he may have hallucinations of people in his room who are not real This started in the hospital- they state he did mention this to neurology but they did not give any particular The hallucinogens are not frightening to him and do not do/ say anything scary to him   He is not driving right now due to the seizures- will not drive for about 6 months  He will start PT today at home  Lab Results  Component Value Date   HGBA1C 10.5 (H) 08/12/2019    Patient Active Problem List   Diagnosis Date Noted   Complex partial status epilepticus ([place]) 08/13/2019   Uncontrolled type 2 diabetes mellitus ([place]) 08/13/2019   Acute confusion 08/12/2019   Unspecified abnormalities of gait and mobility 12/16/2018   Status post ventriculo-peritoneal shunt placement 12/02/2018   Normal pressure hydrocephalus ([place]) 12/02/2018   History of subdural hematoma 11/18/2018   Urinary incontinence 11/18/2018   Memory loss 10/09/2018    Right hemiparesis ([place]) 09/04/2018   Cognitive deficit as late effect of traumatic brain injury ([place]) 09/04/2018   Bradycardia    Leukopenia    Hypoalbuminemia due to protein-calorie malnutrition ([place])    Hydrocele    Hydrocephalus ([place]) 07/28/2018   Mild renal insufficiency 07/28/2018   Paroxysmal atrial fibrillation ([place]) 07/28/2018   Subdural hematoma ([place]) 07/14/2018   Hypertension, essential 01/16/2018   Controlled type 2 diabetes mellitus without complication, without long-term current use of insulin ([place]) 01/16/2018   Coronary artery disease involving native heart without angina pectoris 01/16/2018    Past Medical History:  Diagnosis Date   Abnormality of gait    uses walker and wheelchair   Cognitive deficit as late effect of traumatic brain injury [facility])    Coronary artery disease    cardiologist-- dr s. Jake Bathe---  04-06-2014  NSTEMI  s/p  cardiac cath DES x1 to LAD and POBA to D1   Dysrhythmia    History of non-ST elevation myocardial infarction (NSTEMI)    04-06-2013  s/p  coronary stent and PCI   Hydrocele, bilateral    Hyperlipemia    Hypertension    Lower urinary tract symptoms (LUTS)    Memory deficit    PAF (paroxysmal atrial fibrillation) ([place]) cardilogist-- dr Jake Bathe   on-set 05/ 2020 during admission for [facility],  w/ RVR,  with cardizem/ toprol, back in NSR,  f/u by cardiologist , event monitor 09-16-2018 no results in epic by per pt wife was told result  ok with no afib   S/P drug eluting coronary stent placement    04-06-2014  @[facility]   DES x1 to LAD and POBA to D1   SAH (subarachnoid hemorrhage) [facility]) neurologist-  dr [facility]--- last head CT 10-01-2018 resolving (10-23-2018  residual deficits memory issues, abnormal gait, congnitive   w/ SDH due to unwitnessed fall, traumatic head injury---- admission 07-14-2018 , d/c'd 08-01-2018,  readmitted 3 days later with late TBI effects,  d/c'd from rehab 08-22-2018   Seizure disorder [facility])    followed  by dr [facility]   Subdural hematoma (HCC)    Type 2 diabetes mellitus (HCC)    followed by pcp   Urinary incontinence    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    Past Surgical History:  Procedure Laterality Date   CATARACT EXTRACTION W/ INTRAOCULAR LENS  IMPLANT, BILATERAL  2008; 2009   CORONARY ANGIOPLASTY WITH STENT PLACEMENT  04-06-2014  @[facility]    DES x1 to LAD and POBA to D1   HYDROCELE EXCISION Bilateral 10/27/2018   Procedure: HYDROCELECTOMY ADULT;  Surgeon: , MD;  Location: [facility];  Service: Urology;  Laterality: Bilateral;   TONSILLECTOMY  child   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   Procedure: Right ventriculoperitoneal shunt placement;  Surgeon: [facility] MD;  [facility] OR;  Service: Neurosurgery;  Laterality: Right;    Social History   Tobacco Use   Smoking status: Never Smoker   Smokeless tobacco: Never Used  Vaping Use   Vaping Use: Never used  Substance Use Topics   Alcohol use: Not Currently    Comment: VERY RARELY   Drug use: Never    Family History  Problem Relation Age of Onset   Stroke Father    Hypertension Father    Hyperlipidemia Father    Early death Mother    Colitis Sister    Appendicitis Brother     No Known Allergies  Medication list has been reviewed and updated.  Current Outpatient Medications on File Prior to Visit  Medication Sig Dispense Refill   atorvastatin (LIPITOR) 80 MG tablet Take 1 tablet (80 mg total) by mouth daily. (Patient taking differently: Take 80 mg by mouth every evening. ) 90 tablet 3   divalproex (DEPAKOTE) 500 MG DR tablet Take 2 tablets (1,000 mg total) by mouth every 12 (twelve) hours. 120 tablet 5   glipiZIDE (GLUCOTROL XL) 10 MG 24 hr tablet Take 1 tablet (10 mg total) by mouth 2 (two) times daily. 180 tablet 3   levETIRAcetam (KEPPRA) 1000 MG tablet Take 1 tablet (1,000 mg total) by mouth 2 (two) times daily. 180 tablet 1   metFORMIN (GLUCOPHAGE) 1000 MG  tablet Take 1 tablet (1,000 mg total) by mouth 2 (two) times daily with a meal. 60 tablet 1   metoprolol succinate (TOPROL XL) 25 MG 24 hr tablet Take 1 tablet (25 mg total) by mouth daily. 30 tablet 1   tamsulosin (FLOMAX) 0.4 MG CAPS capsule Take 1 capsule (0.4 mg total) by mouth daily after supper. 30 capsule 1   No current facility-administered medications on file prior to visit.    Review of Systems:  As per HPI- otherwise negative.   Physical Examination: Vitals:   08/24/19 1025  BP: 122/64  Pulse: 60  Resp: 17  Temp: (!) 97.3 F (36.3 C)  SpO2: 98%   Vitals:   08/24/19 1025  Weight: 224 lb (101.6 kg)  Height: 5\' 10"  (1.778 m)   Body mass index is 32.14 kg/m.  Ideal Body Weight: Weight in (lb) to have BMI = 25: 173.9  GEN: no acute distress.  Obese, accompanied by his wife today.  Looks well HEENT: Atraumatic, Normocephalic.  Ears and Nose: No external deformity. CV: RRR, No M/G/R. No JVD. No thrill. No extra heart sounds. PULM: CTA B, no wheezes, crackles, rhonchi. No retractions. No resp. distress. No accessory muscle use. ABD: S, NT, ND, +BS. No rebound. No HSM. EXTR: No c/c/e PSYCH: Normally interactive. Conversant.    Assessment and Plan: Hospital discharge follow-up  Controlled type 2 diabetes mellitus without complication, without long-term current use of insulin (HCC) - Plan: metFORMIN (GLUCOPHAGE) 1000 MG tablet  Subdural hematoma (HCC)  Non-refractory simple partial status epilepticus (HCC)  Following up today for recent hospital stay.  He was admitted with apparent seizure activity, is now taking Depakote and Keppra and doing better We are not sure if his seizures are distant sequelae of his traumatic brain injury from last year.  The cause of his fall last year was also not been clear, it is possible that the seizure may have triggered his fall in the first place He will continue to follow-up with neurology as directed  Diabetes is not well  controlled.  His Metformin dose has been increased.  I encouraged him to decrease carbohydrate intake, work on increased exercise.  I also asked him to check a fasting blood sugar perhaps once a week, if this is consistently higher than 200 I will make another medication adjustment  Asked him to follow-up with me in about 10 weeks This visit occurred during the SARS-CoV-2 public health emergency.  Safety protocols were in place, including screening questions prior to the visit, additional usage of staff PPE, and extensive cleaning of exam room while observing appropriate contact time as indicated for disinfecting solutions.    Signed Abbe Amsterdam, MD

## 2019-08-24 ENCOUNTER — Ambulatory Visit (INDEPENDENT_AMBULATORY_CARE_PROVIDER_SITE_OTHER): Payer: Medicare HMO | Admitting: Family Medicine

## 2019-08-24 ENCOUNTER — Other Ambulatory Visit: Payer: Self-pay

## 2019-08-24 ENCOUNTER — Encounter: Payer: Self-pay | Admitting: Family Medicine

## 2019-08-24 VITALS — BP 122/64 | HR 60 | Temp 97.3°F | Resp 17 | Ht 70.0 in | Wt 224.0 lb

## 2019-08-24 DIAGNOSIS — R29810 Facial weakness: Secondary | ICD-10-CM | POA: Diagnosis not present

## 2019-08-24 DIAGNOSIS — E119 Type 2 diabetes mellitus without complications: Secondary | ICD-10-CM | POA: Diagnosis not present

## 2019-08-24 DIAGNOSIS — G40101 Localization-related (focal) (partial) symptomatic epilepsy and epileptic syndromes with simple partial seizures, not intractable, with status epilepticus: Secondary | ICD-10-CM

## 2019-08-24 DIAGNOSIS — S065X9A Traumatic subdural hemorrhage with loss of consciousness of unspecified duration, initial encounter: Secondary | ICD-10-CM | POA: Diagnosis not present

## 2019-08-24 DIAGNOSIS — G40209 Localization-related (focal) (partial) symptomatic epilepsy and epileptic syndromes with complex partial seizures, not intractable, without status epilepticus: Secondary | ICD-10-CM | POA: Diagnosis not present

## 2019-08-24 DIAGNOSIS — G912 (Idiopathic) normal pressure hydrocephalus: Secondary | ICD-10-CM | POA: Diagnosis not present

## 2019-08-24 DIAGNOSIS — I2510 Atherosclerotic heart disease of native coronary artery without angina pectoris: Secondary | ICD-10-CM | POA: Diagnosis not present

## 2019-08-24 DIAGNOSIS — Z09 Encounter for follow-up examination after completed treatment for conditions other than malignant neoplasm: Secondary | ICD-10-CM | POA: Diagnosis not present

## 2019-08-24 DIAGNOSIS — I119 Hypertensive heart disease without heart failure: Secondary | ICD-10-CM | POA: Diagnosis not present

## 2019-08-24 DIAGNOSIS — I252 Old myocardial infarction: Secondary | ICD-10-CM | POA: Diagnosis not present

## 2019-08-24 DIAGNOSIS — I480 Paroxysmal atrial fibrillation: Secondary | ICD-10-CM | POA: Diagnosis not present

## 2019-08-24 DIAGNOSIS — R4701 Aphasia: Secondary | ICD-10-CM | POA: Diagnosis not present

## 2019-08-24 DIAGNOSIS — S065XAA Traumatic subdural hemorrhage with loss of consciousness status unknown, initial encounter: Secondary | ICD-10-CM

## 2019-08-24 MED ORDER — METFORMIN HCL 1000 MG PO TABS: 1000.0000 mg | ORAL_TABLET | Freq: Two times a day (BID) | ORAL | 3 refills | Status: DC

## 2019-08-24 NOTE — Patient Instructions (Signed)
It was good to see you again today-  I am glad that you are back home!  We need to work on your blood sugars- you recently increased your dose of Metformin which should help.  As you are able, please work on exercise such as walking.  Also, if you would please check your blood sugar once or twice per week.  Please check your sugar fasting, in the morning before you eat. If you are persistently having numbers greater than 200 , please let me know.  In this case, we may need to make another change to your blood sugar medication  Please do not drive until you are cleared by neurology  Please schedule to see me in about 10 weeks

## 2019-08-27 DIAGNOSIS — I252 Old myocardial infarction: Secondary | ICD-10-CM | POA: Diagnosis not present

## 2019-08-27 DIAGNOSIS — G912 (Idiopathic) normal pressure hydrocephalus: Secondary | ICD-10-CM | POA: Diagnosis not present

## 2019-08-27 DIAGNOSIS — R29810 Facial weakness: Secondary | ICD-10-CM | POA: Diagnosis not present

## 2019-08-27 DIAGNOSIS — R4701 Aphasia: Secondary | ICD-10-CM | POA: Diagnosis not present

## 2019-08-27 DIAGNOSIS — G40209 Localization-related (focal) (partial) symptomatic epilepsy and epileptic syndromes with complex partial seizures, not intractable, without status epilepticus: Secondary | ICD-10-CM | POA: Diagnosis not present

## 2019-08-27 DIAGNOSIS — I480 Paroxysmal atrial fibrillation: Secondary | ICD-10-CM | POA: Diagnosis not present

## 2019-08-27 DIAGNOSIS — I119 Hypertensive heart disease without heart failure: Secondary | ICD-10-CM | POA: Diagnosis not present

## 2019-08-27 DIAGNOSIS — I2510 Atherosclerotic heart disease of native coronary artery without angina pectoris: Secondary | ICD-10-CM | POA: Diagnosis not present

## 2019-08-27 DIAGNOSIS — E119 Type 2 diabetes mellitus without complications: Secondary | ICD-10-CM | POA: Diagnosis not present

## 2019-08-28 DIAGNOSIS — E782 Mixed hyperlipidemia: Secondary | ICD-10-CM | POA: Diagnosis not present

## 2019-08-28 DIAGNOSIS — I10 Essential (primary) hypertension: Secondary | ICD-10-CM | POA: Diagnosis not present

## 2019-08-28 DIAGNOSIS — I2510 Atherosclerotic heart disease of native coronary artery without angina pectoris: Secondary | ICD-10-CM | POA: Diagnosis not present

## 2019-08-28 DIAGNOSIS — E1165 Type 2 diabetes mellitus with hyperglycemia: Secondary | ICD-10-CM | POA: Diagnosis not present

## 2019-08-28 DIAGNOSIS — I480 Paroxysmal atrial fibrillation: Secondary | ICD-10-CM | POA: Diagnosis not present

## 2019-08-31 DIAGNOSIS — R4701 Aphasia: Secondary | ICD-10-CM | POA: Diagnosis not present

## 2019-08-31 DIAGNOSIS — E119 Type 2 diabetes mellitus without complications: Secondary | ICD-10-CM | POA: Diagnosis not present

## 2019-08-31 DIAGNOSIS — I252 Old myocardial infarction: Secondary | ICD-10-CM | POA: Diagnosis not present

## 2019-08-31 DIAGNOSIS — I2510 Atherosclerotic heart disease of native coronary artery without angina pectoris: Secondary | ICD-10-CM | POA: Diagnosis not present

## 2019-08-31 DIAGNOSIS — I480 Paroxysmal atrial fibrillation: Secondary | ICD-10-CM | POA: Diagnosis not present

## 2019-08-31 DIAGNOSIS — I119 Hypertensive heart disease without heart failure: Secondary | ICD-10-CM | POA: Diagnosis not present

## 2019-08-31 DIAGNOSIS — R29810 Facial weakness: Secondary | ICD-10-CM | POA: Diagnosis not present

## 2019-08-31 DIAGNOSIS — G912 (Idiopathic) normal pressure hydrocephalus: Secondary | ICD-10-CM | POA: Diagnosis not present

## 2019-08-31 DIAGNOSIS — G40209 Localization-related (focal) (partial) symptomatic epilepsy and epileptic syndromes with complex partial seizures, not intractable, without status epilepticus: Secondary | ICD-10-CM | POA: Diagnosis not present

## 2019-09-02 DIAGNOSIS — I252 Old myocardial infarction: Secondary | ICD-10-CM | POA: Diagnosis not present

## 2019-09-02 DIAGNOSIS — G912 (Idiopathic) normal pressure hydrocephalus: Secondary | ICD-10-CM | POA: Diagnosis not present

## 2019-09-02 DIAGNOSIS — I119 Hypertensive heart disease without heart failure: Secondary | ICD-10-CM | POA: Diagnosis not present

## 2019-09-02 DIAGNOSIS — G40209 Localization-related (focal) (partial) symptomatic epilepsy and epileptic syndromes with complex partial seizures, not intractable, without status epilepticus: Secondary | ICD-10-CM | POA: Diagnosis not present

## 2019-09-02 DIAGNOSIS — I480 Paroxysmal atrial fibrillation: Secondary | ICD-10-CM | POA: Diagnosis not present

## 2019-09-02 DIAGNOSIS — R4701 Aphasia: Secondary | ICD-10-CM | POA: Diagnosis not present

## 2019-09-02 DIAGNOSIS — I2510 Atherosclerotic heart disease of native coronary artery without angina pectoris: Secondary | ICD-10-CM | POA: Diagnosis not present

## 2019-09-02 DIAGNOSIS — E119 Type 2 diabetes mellitus without complications: Secondary | ICD-10-CM | POA: Diagnosis not present

## 2019-09-02 DIAGNOSIS — R29810 Facial weakness: Secondary | ICD-10-CM | POA: Diagnosis not present

## 2019-09-07 DIAGNOSIS — E119 Type 2 diabetes mellitus without complications: Secondary | ICD-10-CM | POA: Diagnosis not present

## 2019-09-07 DIAGNOSIS — I119 Hypertensive heart disease without heart failure: Secondary | ICD-10-CM | POA: Diagnosis not present

## 2019-09-07 DIAGNOSIS — G40209 Localization-related (focal) (partial) symptomatic epilepsy and epileptic syndromes with complex partial seizures, not intractable, without status epilepticus: Secondary | ICD-10-CM | POA: Diagnosis not present

## 2019-09-07 DIAGNOSIS — R4701 Aphasia: Secondary | ICD-10-CM | POA: Diagnosis not present

## 2019-09-07 DIAGNOSIS — G912 (Idiopathic) normal pressure hydrocephalus: Secondary | ICD-10-CM | POA: Diagnosis not present

## 2019-09-07 DIAGNOSIS — R29810 Facial weakness: Secondary | ICD-10-CM | POA: Diagnosis not present

## 2019-09-07 DIAGNOSIS — I480 Paroxysmal atrial fibrillation: Secondary | ICD-10-CM | POA: Diagnosis not present

## 2019-09-07 DIAGNOSIS — I252 Old myocardial infarction: Secondary | ICD-10-CM | POA: Diagnosis not present

## 2019-09-07 DIAGNOSIS — I2510 Atherosclerotic heart disease of native coronary artery without angina pectoris: Secondary | ICD-10-CM | POA: Diagnosis not present

## 2019-09-09 ENCOUNTER — Telehealth: Payer: Self-pay | Admitting: Neurology

## 2019-09-09 DIAGNOSIS — R4701 Aphasia: Secondary | ICD-10-CM | POA: Diagnosis not present

## 2019-09-09 DIAGNOSIS — E119 Type 2 diabetes mellitus without complications: Secondary | ICD-10-CM | POA: Diagnosis not present

## 2019-09-09 DIAGNOSIS — G912 (Idiopathic) normal pressure hydrocephalus: Secondary | ICD-10-CM | POA: Diagnosis not present

## 2019-09-09 DIAGNOSIS — R29810 Facial weakness: Secondary | ICD-10-CM | POA: Diagnosis not present

## 2019-09-09 DIAGNOSIS — I2510 Atherosclerotic heart disease of native coronary artery without angina pectoris: Secondary | ICD-10-CM | POA: Diagnosis not present

## 2019-09-09 DIAGNOSIS — I252 Old myocardial infarction: Secondary | ICD-10-CM | POA: Diagnosis not present

## 2019-09-09 DIAGNOSIS — I119 Hypertensive heart disease without heart failure: Secondary | ICD-10-CM | POA: Diagnosis not present

## 2019-09-09 DIAGNOSIS — I480 Paroxysmal atrial fibrillation: Secondary | ICD-10-CM | POA: Diagnosis not present

## 2019-09-09 DIAGNOSIS — G40209 Localization-related (focal) (partial) symptomatic epilepsy and epileptic syndromes with complex partial seizures, not intractable, without status epilepticus: Secondary | ICD-10-CM | POA: Diagnosis not present

## 2019-09-09 NOTE — Telephone Encounter (Signed)
I called the patient, spoke to his wife.  She indicates he is 90% back to normal.  Does complain of some sleepiness, possibly secondary to medication side effect.  He still has some issues with balance, but his speech, and cognition has returned to normal.  He will resume aspirin 81 mg daily.  Has follow-up appointment in September with Dr. Terrace Arabia, advised to call with any problems or concerns in the interim.

## 2019-09-09 NOTE — Telephone Encounter (Signed)
I reviewed cardiology visit on August 28, 2019 by Dr. Cathlyn Parsons [place], coronary artery disease involving native coronary artery without angina pectoris, hypertension, paroxysmal atrial fibrillation, uncontrolled diabetes with microalbuminemia, mixed hyperlipidemia,  Dr. Dot Been think patient is doing quite well from cardiac standpoint, he has been off aspirin since summer of 2020, he is wondering if patient can go back on aspirin 81 mg daily  I reviewed most recent MRI of brain August 12, 2019, no acute intracranial abnormality, right frontal approach shunt catheter in place, no hydrocephalus, he should be able to resume aspirin 81 mg daily   Please check with patient to see how is he doing, has his mentation come back to his baseline, there was no significant change/deviation from baseline, may resume aspirin 81 mg daily

## 2019-09-14 DIAGNOSIS — R4701 Aphasia: Secondary | ICD-10-CM | POA: Diagnosis not present

## 2019-09-14 DIAGNOSIS — I2510 Atherosclerotic heart disease of native coronary artery without angina pectoris: Secondary | ICD-10-CM | POA: Diagnosis not present

## 2019-09-14 DIAGNOSIS — E119 Type 2 diabetes mellitus without complications: Secondary | ICD-10-CM | POA: Diagnosis not present

## 2019-09-14 DIAGNOSIS — I480 Paroxysmal atrial fibrillation: Secondary | ICD-10-CM | POA: Diagnosis not present

## 2019-09-14 DIAGNOSIS — I252 Old myocardial infarction: Secondary | ICD-10-CM | POA: Diagnosis not present

## 2019-09-14 DIAGNOSIS — R29810 Facial weakness: Secondary | ICD-10-CM | POA: Diagnosis not present

## 2019-09-14 DIAGNOSIS — G40209 Localization-related (focal) (partial) symptomatic epilepsy and epileptic syndromes with complex partial seizures, not intractable, without status epilepticus: Secondary | ICD-10-CM | POA: Diagnosis not present

## 2019-09-14 DIAGNOSIS — I119 Hypertensive heart disease without heart failure: Secondary | ICD-10-CM | POA: Diagnosis not present

## 2019-09-14 DIAGNOSIS — G912 (Idiopathic) normal pressure hydrocephalus: Secondary | ICD-10-CM | POA: Diagnosis not present

## 2019-09-18 DIAGNOSIS — G40209 Localization-related (focal) (partial) symptomatic epilepsy and epileptic syndromes with complex partial seizures, not intractable, without status epilepticus: Secondary | ICD-10-CM | POA: Diagnosis not present

## 2019-09-18 DIAGNOSIS — I252 Old myocardial infarction: Secondary | ICD-10-CM | POA: Diagnosis not present

## 2019-09-18 DIAGNOSIS — E119 Type 2 diabetes mellitus without complications: Secondary | ICD-10-CM | POA: Diagnosis not present

## 2019-09-18 DIAGNOSIS — I480 Paroxysmal atrial fibrillation: Secondary | ICD-10-CM | POA: Diagnosis not present

## 2019-09-18 DIAGNOSIS — R29810 Facial weakness: Secondary | ICD-10-CM | POA: Diagnosis not present

## 2019-09-18 DIAGNOSIS — R4701 Aphasia: Secondary | ICD-10-CM | POA: Diagnosis not present

## 2019-09-18 DIAGNOSIS — G912 (Idiopathic) normal pressure hydrocephalus: Secondary | ICD-10-CM | POA: Diagnosis not present

## 2019-09-18 DIAGNOSIS — I119 Hypertensive heart disease without heart failure: Secondary | ICD-10-CM | POA: Diagnosis not present

## 2019-09-18 DIAGNOSIS — I2510 Atherosclerotic heart disease of native coronary artery without angina pectoris: Secondary | ICD-10-CM | POA: Diagnosis not present

## 2019-09-22 ENCOUNTER — Other Ambulatory Visit: Payer: Self-pay

## 2019-09-22 DIAGNOSIS — I119 Hypertensive heart disease without heart failure: Secondary | ICD-10-CM | POA: Diagnosis not present

## 2019-09-22 DIAGNOSIS — G912 (Idiopathic) normal pressure hydrocephalus: Secondary | ICD-10-CM | POA: Diagnosis not present

## 2019-09-22 DIAGNOSIS — I2510 Atherosclerotic heart disease of native coronary artery without angina pectoris: Secondary | ICD-10-CM | POA: Diagnosis not present

## 2019-09-22 DIAGNOSIS — R4701 Aphasia: Secondary | ICD-10-CM | POA: Diagnosis not present

## 2019-09-22 DIAGNOSIS — R29810 Facial weakness: Secondary | ICD-10-CM | POA: Diagnosis not present

## 2019-09-22 DIAGNOSIS — I252 Old myocardial infarction: Secondary | ICD-10-CM | POA: Diagnosis not present

## 2019-09-22 DIAGNOSIS — G40209 Localization-related (focal) (partial) symptomatic epilepsy and epileptic syndromes with complex partial seizures, not intractable, without status epilepticus: Secondary | ICD-10-CM | POA: Diagnosis not present

## 2019-09-22 DIAGNOSIS — E119 Type 2 diabetes mellitus without complications: Secondary | ICD-10-CM | POA: Diagnosis not present

## 2019-09-22 DIAGNOSIS — I480 Paroxysmal atrial fibrillation: Secondary | ICD-10-CM | POA: Diagnosis not present

## 2019-09-28 ENCOUNTER — Other Ambulatory Visit: Payer: Self-pay | Admitting: *Deleted

## 2019-09-28 NOTE — Patient Outreach (Signed)
[facility]) Care Management  09/28/2019  Lupita Shutter. 10-Mar-1951 [id number]   Telephone Assessment  Initial screening.   Referral received : 09/22/19 Referral source:  Join EMMI Insurance : [employer]    Outreach Attempt #1 Subjective: Successful outreach call to patient , explained purpose of the call and [facility] care management services. Discussed prior call from [facility] CMA regarding [facility] care management services program, patient does not recall .  Patient placed his wife Sandi Mealy on the phone, further explained program non cost  benefit of health insurance plan, to help with maintaining health providing education information and providing support on health concerns.  Explained the team consist of RN, Social workers and pharmacist to help with improving health.  Patient/wife states that they are not interested in receiving information on health condition such as diabetes.  She expressed interest in receiving information on community resources that may be available to provide in person services to check on  patient if she decides to go out of town for a few days some one to check on him.   Patient/declines [facility] care management services at this time, they declined return call . Unable to complete additional assessments. She is agreeable to receive information on personal care services in [place] area if needed.   Plan Will close case to [facility] care management.  Will send patient successful outreach letter along with information on community resource information on personal care services.    Egbert Garibaldi, RN, BSN  [facility] Care Management,Care Management Coordinator  [phone]- Mobile [phone]- Toll Free Main Office

## 2019-10-06 DIAGNOSIS — G912 (Idiopathic) normal pressure hydrocephalus: Secondary | ICD-10-CM | POA: Diagnosis not present

## 2019-10-06 DIAGNOSIS — I252 Old myocardial infarction: Secondary | ICD-10-CM | POA: Diagnosis not present

## 2019-10-06 DIAGNOSIS — E119 Type 2 diabetes mellitus without complications: Secondary | ICD-10-CM | POA: Diagnosis not present

## 2019-10-06 DIAGNOSIS — I2510 Atherosclerotic heart disease of native coronary artery without angina pectoris: Secondary | ICD-10-CM | POA: Diagnosis not present

## 2019-10-06 DIAGNOSIS — R29810 Facial weakness: Secondary | ICD-10-CM | POA: Diagnosis not present

## 2019-10-06 DIAGNOSIS — I480 Paroxysmal atrial fibrillation: Secondary | ICD-10-CM | POA: Diagnosis not present

## 2019-10-06 DIAGNOSIS — I119 Hypertensive heart disease without heart failure: Secondary | ICD-10-CM | POA: Diagnosis not present

## 2019-10-06 DIAGNOSIS — G40209 Localization-related (focal) (partial) symptomatic epilepsy and epileptic syndromes with complex partial seizures, not intractable, without status epilepticus: Secondary | ICD-10-CM | POA: Diagnosis not present

## 2019-10-06 DIAGNOSIS — R4701 Aphasia: Secondary | ICD-10-CM | POA: Diagnosis not present

## 2019-11-08 NOTE — Progress Notes (Deleted)
[facility] [address] [place], [place] [address] [phone]  Date:  11/09/2019   Name:  [name] Frank.   DOB:  1950-07-08   MRN:  [id number]  PCP:  Pearline Cables, MD    Chief Complaint: No chief complaint on file.   History of Present Illness:  [name] Frank. is a 69 y.o. very pleasant male patient who presents with the following:  Here today for short-term follow-up visit Last seen by myself on June 14: History of diabetes, hypertension, renal insufficiency, paroxysmal atrial fib, hyperlipidemia He fell in May 2020, sustained a TBI and subdural hematoma.  Later found to have normal pressure hydrocephalus and had a VP shunt placed September 2020 with improvement He presented to the ER on June 1 with change in mental status and word finding difficulty-he was evaluated and released Represented to ER on June 3 and was admitted with partial status epilepticus  From our most recent visit:  Following up today for recent hospital stay.  He was admitted with apparent seizure activity, is now taking Depakote and Keppra and doing better We are not sure if his seizures are distant sequelae of his traumatic brain injury from last year.  The cause of his fall last year was also not been clear, it is possible that the seizure may have triggered his fall in the first place He will continue to follow-up with neurology as directed Diabetes is not well controlled.  His Metformin dose has been increased.  I encouraged him to decrease carbohydrate intake, work on increased exercise.  I also asked him to check a fasting blood sugar perhaps once a week, if this is consistently higher than 200 I will make another medication adjustment  He saw his cardiologist, Dr. Tasia Catchings with [facility] in June as well.  No changes made  Eye exam Urine microalbumin Colon cancer screening Foot exam due Primary Covid series is completed  Lab  Results  Component Value Date   HGBA1C 10.5 (H) 08/12/2019     Patient Active Problem List   Diagnosis Date Noted  . Complex partial status epilepticus (HCC) 08/13/2019  . Uncontrolled type 2 diabetes mellitus (HCC) 08/13/2019  . Acute confusion 08/12/2019  . Unspecified abnormalities of gait and mobility 12/16/2018  . 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 Normal pressure hydrocephalus (HCC) 12/02/2018  . History of subdural hematoma 11/18/2018  . Urinary incontinence 11/18/2018  . Memory loss 10/09/2018  . Right hemiparesis (HCC) 09/04/2018  . Cognitive deficit as late effect of traumatic brain injury (HCC) 09/04/2018  . Bradycardia   . Leukopenia   . Hypoalbuminemia due to protein-calorie malnutrition (HCC)   . Hydrocele   . Hydrocephalus (HCC) 07/28/2018  . Mild renal insufficiency 07/28/2018  . Paroxysmal atrial fibrillation (HCC) 07/28/2018  . Subdural hematoma (HCC) 07/14/2018  . Hypertension, essential 01/16/2018  . Controlled type 2 diabetes mellitus without complication, without long-term current use of insulin (HCC) 01/16/2018  . Coronary artery disease involving native heart without angina pectoris 01/16/2018    Past Medical History:  Diagnosis Date  . Abnormality of gait    uses walker and wheelchair  . Cognitive deficit as late effect of traumatic brain injury (HCC)   . Coronary artery disease    cardiologist-- dr s. Dickie La---  04-06-2014  NSTEMI  s/p  cardiac cath DES x1 to LAD and POBA to D1  . Dysrhythmia   .  History of non-ST elevation myocardial infarction (NSTEMI)    04-06-2013  s/p  coronary stent and PCI  . Hydrocele, bilateral   . Hyperlipemia   . Hypertension   . Lower urinary tract symptoms (LUTS)   . Memory deficit   . PAF (paroxysmal atrial fibrillation) (HCC) cardilogist-- dr Dickie La   on-set 05/ 2020 during admission for [facility],  w/ RVR,  with cardizem/ toprol, back in NSR,  f/u by cardiologist , event monitor  09-16-2018 no results in epic by per pt wife was told result ok with no afib  . S/P drug eluting coronary stent placement    04-06-2014  @[facility]   DES x1 to LAD and POBA to D1  . SAH (subarachnoid hemorrhage) [facility]) neurologist-  dr [facility]--- last head CT 10-01-2018 resolving (10-23-2018  residual deficits memory issues, abnormal gait, congnitive   w/ SDH due to unwitnessed fall, traumatic head injury---- admission 07-14-2018 , d/c'd 08-01-2018,  readmitted 3 days later with late TBI effects,  d/c'd from rehab 08-22-2018  . Seizure disorder [facility])    followed by dr [facility]  . Subdural hematoma (HCC)   . Type 2 diabetes mellitus (HCC)    followed by pcp  . 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 CATARACT EXTRACTION W/ INTRAOCULAR LENS  IMPLANT, BILATERAL  2008; 2009  . CORONARY ANGIOPLASTY WITH STENT PLACEMENT  04-06-2014  @[facility]    DES x1 to LAD and POBA to D1  . HYDROCELE EXCISION Bilateral 10/27/2018   Procedure: HYDROCELECTOMY ADULT;  Surgeon: , MD;  Location: [facility];  Service: Urology;  Laterality: Bilateral;  . TONSILLECTOMY  child  . 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   Procedure: Right ventriculoperitoneal shunt placement;  Surgeon: [facility] MD;  Location: [facility] OR;  Service: Neurosurgery;  Laterality: Right;    Social History   Tobacco Use  . Smoking status: Never Smoker  . Smokeless tobacco: Never Used  Vaping Use  . Vaping Use: Never used  Substance Use Topics  . Alcohol use: Not Currently    Comment: VERY RARELY  . Drug use: Never    Family History  Problem Relation Age of Onset  . Stroke Father   . Hypertension Father   . Hyperlipidemia Father   . Early death Mother   . Colitis Sister   . Appendicitis Brother     No Known Allergies  Medication list has been reviewed and updated.  Current Outpatient Medications on File Prior to Visit  Medication Sig  Dispense Refill  . atorvastatin (LIPITOR) 80 MG tablet Take 1 tablet (80 mg total) by mouth daily. (Patient taking differently: Take 80 mg by mouth every evening. ) 90 tablet 3  . divalproex (DEPAKOTE) 500 MG DR tablet Take 2 tablets (1,000 mg total) by mouth every 12 (twelve) hours. 120 tablet 5  . glipiZIDE (GLUCOTROL XL) 10 MG 24 hr tablet Take 1 tablet (10 mg total) by mouth 2 (two) times daily. 180 tablet 3  . levETIRAcetam (KEPPRA) 1000 MG tablet Take 1 tablet (1,000 mg total) by mouth 2 (two) times daily. 180 tablet 1  . metFORMIN (GLUCOPHAGE) 1000 MG tablet Take 1 tablet (1,000 mg total) by mouth 2 (two) times daily with a meal. 180 tablet 3  . metoprolol succinate (TOPROL XL) 25 MG 24 hr tablet Take 1 tablet (25 mg total) by mouth daily. 30 tablet 1  . tamsulosin (FLOMAX) 0.4 MG CAPS capsule  Take 1 capsule (0.4 mg total) by mouth daily after supper. 30 capsule 1   No current facility-administered medications on file prior to visit.    Review of Systems:  As per HPI- otherwise negative.   Physical Examination: There were no vitals filed for this visit. There were no vitals filed for this visit. There is no height or weight on file to calculate BMI. Ideal Body Weight:    GEN: no acute distress. HEENT: Atraumatic, Normocephalic.  Ears and Nose: No external deformity. CV: RRR, No M/G/R. No JVD. No thrill. No extra heart sounds. PULM: CTA B, no wheezes, crackles, rhonchi. No retractions. No resp. distress. No accessory muscle use. ABD: S, NT, ND, +BS. No rebound. No HSM. EXTR: No c/c/e PSYCH: Normally interactive. Conversant.    Assessment and Plan: *** This visit occurred during the SARS-CoV-2 public health emergency.  Safety protocols were in place, including screening questions prior to the visit, additional usage of staff PPE, and extensive cleaning of exam room while observing appropriate contact time as indicated for disinfecting solutions.    Signed Abbe Amsterdam,  MD

## 2019-11-09 ENCOUNTER — Ambulatory Visit: Payer: Medicare HMO | Admitting: Family Medicine

## 2019-11-23 ENCOUNTER — Encounter: Payer: Self-pay | Admitting: Neurology

## 2019-11-23 ENCOUNTER — Ambulatory Visit: Payer: Medicare HMO | Admitting: Neurology

## 2019-11-23 VITALS — BP 116/72 | HR 66 | Ht 70.0 in | Wt 219.0 lb

## 2019-11-23 DIAGNOSIS — Z8679 Personal history of other diseases of the circulatory system: Secondary | ICD-10-CM

## 2019-11-23 DIAGNOSIS — G912 (Idiopathic) normal pressure hydrocephalus: Secondary | ICD-10-CM

## 2019-11-23 DIAGNOSIS — Z982 Presence of cerebrospinal fluid drainage device: Secondary | ICD-10-CM | POA: Diagnosis not present

## 2019-11-23 DIAGNOSIS — R269 Unspecified abnormalities of gait and mobility: Secondary | ICD-10-CM

## 2019-11-23 DIAGNOSIS — G40211 Localization-related (focal) (partial) symptomatic epilepsy and epileptic syndromes with complex partial seizures, intractable, with status epilepticus: Secondary | ICD-10-CM

## 2019-11-23 MED ORDER — DIVALPROEX SODIUM 500 MG PO DR TAB: 1000.0000 mg | DELAYED_RELEASE_TABLET | Freq: Two times a day (BID) | ORAL | 4 refills | Status: DC

## 2019-11-23 MED ORDER — LEVETIRACETAM 500 MG PO TABS: ORAL_TABLET | ORAL | 4 refills | Status: DC

## 2019-11-23 NOTE — Progress Notes (Signed)
HISTORY OF PRESENT ILLNESS: [name] Frank.is a 69 year old male, seen in request by his primary care physician Dr. Lamar Blinks for evaluation of possible seizure, history of subdural hematoma, initial evaluation was on October 09, 2018.  I have reviewed and summarized the referring note from the referring physician. He had a past medical history of hypertension, type 2 diabetes, coronary artery disease, paroxysmal atrial fibrillation, he had an unwitnessed fall at the bottom of the stairs, was brought to the emergency room on Jul 14, 2018, I personally reviewed CT head, acute subdural hematoma along the left cerebral convexity measuring up to 11 mm in thickness, patchy subarachnoid hemorrhage with a traumatic pattern, negative for cervical spine fracture. 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 He denies gait abnormality, memory loss at that time.  He readmitted to the hospital on Jul 28, 2018 for more somnolent, slept all day on May 16 sudden onset garbled speech, right facial droop, transient confusion, emergency room blood pressure 90/53, CT head showed developing community hydrocephalus, likely due to recent subarachnoid and intraventricular hemorrhage, no surgical intervention was indicated per neurosurgery evaluation, suspicious for complex partial seizure, he was discharged with Keppra 500 mg twice a day  Since recent hospital admission, there was no clinical seizure, but he was noted significant memory loss, he states, repeating questions, slept a lot, lost control of his urine, also has mild gait abnormality,  He is always a slow walker, he tends to wait on him, by  reviewing the initial CT scan on Jul 14, 2018, there is already some evidence of hydrocephalus, enlarged bilateral lateral ventricle,  I personally reviewed MRI of the brain on Jul 29, 2018, persistent ventriculomegaly, suspected developing communicating hydrocephalus, no evidence of acute stroke, 10 mm left subdural hematoma, with no mass-effect on the hemisphere,  Laboratory evaluations showed normal ESR, C-reactive protein, ANA, HIV, RPR, B12, A1c was 7.2, hemoglobin was 12.8,   Update November 19, 2018: He continues to have significant memory loss, gait abnormality, worsening urinary incontinence, this is all new since May 2020, he retired as an Engineer, 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[employer], later worked as a Microbiologist, just finished a complicated project in April 2020, now he has difficulty operating coffee machine, difficulty initiating his gait, fell few times,  We have personally reviewed MRI CT scans, compared with multiple scans he had a since May 2020, most recent MRI brain November 15, 2018: Resolution of left subdural hematoma, ventriculomegaly out of proportion to the extent of mild generalized cortical atrophy, additionally there appears to be mild transependymal flow, 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 Zada Finders, will see him on September 9  He is tolerating Keppra 500 mg twice daily well, there was no recurrent seizure  He has tried Sinemet 25/100 mg 3 times daily, there was no significant improvement noted  UPDATE Oct 6th 2020: I reviewed operation record by Dr. Zada Finders, he had shunt replacement December 02, 2018.  He was seen by physical therapy on  December 03, 2018, the day postsurgical, patient with significant posterior lean, poor trunk control,  required bilateral upper extremity support, needs assistance, he reported overall 60% improvement in his walking, urinary frequency and urgency has much improved, still has mild memory loss  I reviewed CT head December 03, 2018: Operative changes from interval right bur hole craniotomy for VP shunt placement,  UPDATE Mar 17 2019: I personally reviewed CT head on Mar 01 2020:Slight further decrease in lateral ventricle size since October.Stable right superior frontal approach shunt. No adverse features. He is overall doing well, wife reported that he is back to his baseline, he is sedentary, is no longer taking Keppra 500 mg twice daily, we went over the initial event in May 2020, when he had unwitnessed fall at the ground level, but with resulting subdural hematoma, he had no recollection of the fall, possibility including seizure, after discussion, we decided to proceed with Keppra 500 mg twice a day  Lab in Dec 2020: LDL 111, CMP, glucose 299, creat 0.78, A1C 9.5  UPDATE Sept 13 2021: Patient presented to emergency room on August 11, 2019 for aphasia, word finding difficulties, left facial droop,  Personally reviewed MRI of the brain on August 12, 2019, no acute abnormality, right frontal VP shunt, stable size,  CT angiogram of head and neck showed no significant large vessel disease, intracranial atherosclerotic disease,  Following her hospital discharge she continued to have intermittent slurred speech, right facial droop, mixed with lucid time, he also increased confusion to the point of urinary incontinence, stat EEG showed left hemispheric rhythmic slowing, consistent with his partial status epilepticus,  He was loaded with Depacon IV, and was sent to emergency room, placed on Depakote DR 750 mg 3 times a day, later switched to ER 1000 mg twice a day, along with Keppra 1000 mg twice daily Keppra level 11.9, TSH 1.820, A1C 10.5.   EEG by Dr. Hortense Ramal on August 14, 2019 showed left  frontotemporal region rhythmic delta activity, a total of interictal continuum, indicating potential epileptogenicity.  Repeat EEG on August 15, 2019 is much improved with higher dose of anti-epileptic medications, also correlate with clinical improvement, I also reviewed cardiology visit on August 28, 2019 by Dr. Kathreen Devoid Rohrbeck, coronary artery disease involving native coronary artery without angina pectoris, hypertension, paroxysmal atrial fibrillation, uncontrolled diabetes with microalbuminemia, mixed hyperlipidemia,  Dr. Marijo File think patient is doing quite well from cardiac standpoint, he has been off aspirin since summer of 2020, now he has gone back on aspirin 81 mg daily,    REVIEW OF SYSTEMS: Out of a complete 14 system review of symptoms, the patient complains only of the following symptoms, and all other reviewed systems are negative.  Seizures  ALLERGIES: No Known Allergies  HOME MEDICATIONS: Outpatient Medications Prior to Visit  Medication Sig Dispense Refill  . atorvastatin (LIPITOR) 80 MG tablet Take 1 tablet (80 mg total) by mouth daily. (Patient taking differently: Take 80 mg by mouth every evening. ) 90 tablet 3  . divalproex (DEPAKOTE) 500 MG DR tablet Take 2 tablets (1,000 mg total) by mouth every 12 (twelve) hours. 120 tablet 5  . glipiZIDE (GLUCOTROL XL) 10 MG 24 hr tablet Take 1 tablet (10 mg total) by mouth 2 (two) times daily. 180 tablet 3  . levETIRAcetam (KEPPRA) 1000 MG tablet Take 1 tablet (1,000 mg total) by mouth 2 (two) times daily. 180 tablet 1  . metFORMIN (GLUCOPHAGE) 1000 MG tablet Take 1 tablet (1,000 mg total) by  mouth 2 (two) times daily with a meal. 180 tablet 3  . metoprolol succinate (TOPROL XL) 25 MG 24 hr tablet Take 1 tablet (25 mg total) by mouth daily. 30 tablet 1  . tamsulosin (FLOMAX) 0.4 MG CAPS capsule Take 1 capsule (0.4 mg total) by mouth daily after supper. 30 capsule 1   No facility-administered medications prior to visit.     PAST MEDICAL HISTORY: Past Medical History:  Diagnosis Date  . Abnormality of gait    uses walker and wheelchair  . Cognitive deficit as late effect of traumatic brain injury ([place])   . Coronary artery disease    cardiologist-- dr s. Jake Bathe---  04-06-2014  NSTEMI  s/p  cardiac cath DES x1 to LAD and POBA to D1  . Dysrhythmia   . History of non-ST elevation myocardial infarction (NSTEMI)    04-06-2013  s/p  coronary stent and PCI  . Hydrocele, bilateral   . Hyperlipemia   . Hypertension   . Lower urinary tract symptoms (LUTS)   . Memory deficit   . PAF (paroxysmal atrial fibrillation) ([place]) cardilogist-- dr Jake Bathe   on-set 05/ 2020 during admission for [facility],  w/ RVR,  with cardizem/ toprol, back in NSR,  f/u by cardiologist , event monitor 09-16-2018 no results in epic by per pt wife was told result ok with no afib  . S/P drug eluting coronary stent placement    04-06-2014  @[facility]   DES x1 to LAD and POBA to D1  . SAH (subarachnoid hemorrhage) [facility]) neurologist-  dr Krista Blue--- last head CT 10-01-2018 resolving (10-23-2018  residual deficits memory issues, abnormal gait, congnitive   w/ SDH due to unwitnessed fall, traumatic head injury---- admission 07-14-2018 , d/c'd 08-01-2018,  readmitted 3 days later with late TBI effects,  d/c'd from rehab 08-22-2018  . Seizure disorder [facility])    followed by dr Krista Blue  . Subdural hematoma ([place])   . Type 2 diabetes mellitus ([place])    followed by pcp  . 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 CATARACT EXTRACTION W/ INTRAOCULAR LENS  IMPLANT, BILATERAL  2008; 2009  . CORONARY ANGIOPLASTY WITH STENT PLACEMENT  04-06-2014  @[facility]    DES x1 to LAD and POBA to D1  . HYDROCELE EXCISION Bilateral 10/27/2018   Procedure: HYDROCELECTOMY ADULT;  Surgeon: [name], MD;  [facility];  Service: Urology;  Laterality: Bilateral;  . TONSILLECTOMY   child  . 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   Procedure: Right ventriculoperitoneal shunt placement;  Surgeon: Judith Part, MD;  Location: [place];  Service: Neurosurgery;  Laterality: Right;    FAMILY HISTORY: Family History  Problem Relation Age of Onset  . Stroke Father   . Hypertension Father   . Hyperlipidemia Father   . Early death Mother   . Colitis Sister   . Appendicitis Brother     SOCIAL HISTORY: Social History   Socioeconomic History  . Marital status: Married    Spouse name: Not on file  . Number of children: 2  . Years of education: college  . Highest education level: Master's degree (e.g., MA, MS, MEng, MEd, MSW, MBA)  Occupational History  . Occupation: Retired  Tobacco Use  . Smoking status: Never Smoker  . Smokeless tobacco: Never Used  Vaping Use  . Vaping Use: Never used  Substance and Sexual Activity  . Alcohol use: Not Currently  Comment: VERY RARELY  . Drug use: Never  . Sexual activity: Not on file  Other Topics Concern  . Not on file  Social History Narrative   Lives at home with his wife.   Right-handed.   Limited caffeine use.   Social Determinants of Health   Financial Resource Strain:   . Difficulty of Paying Living Expenses: Not on file  Food Insecurity:   . Worried About Charity fundraiser in the Last Year: Not on file  . Ran Out of Food in the Last Year: Not on file  Transportation Needs:   . Lack of Transportation (Medical): Not on file  . Lack of Transportation (Non-Medical): Not on file  Physical Activity:   . Days of Exercise per Week: Not on file  . Minutes of Exercise per Session: Not on file  Stress:   . Feeling of Stress : Not on file  Social Connections:   . Frequency of Communication with Friends and Family: Not on file  . Frequency of Social Gatherings with Friends and Family: Not on file  . Attends Religious Services: Not on file  . Active Member of Clubs or Organizations: Not on file  .  Attends Archivist Meetings: Not on file  . Marital Status: Not on file  Intimate Partner Violence:   . Fear of Current or Ex-Partner: Not on file  . Emotionally Abused: Not on file  . Physically Abused: Not on file  . Sexually Abused: Not on file  PHYSICAL EXAM  Vitals:   11/23/19 1425  BP: 116/72  Pulse: 66  Weight: 219 lb (99.3 kg)  Height: 5' 10"  (1.778 m)   Body mass index is 31.42 kg/m.   PHYSICAL EXAMNIATION:  Gen: NAD, conversant, well nourised, well groomed                     Cardiovascular: Regular rate rhythm, no peripheral edema, warm, nontender. Eyes: Conjunctivae clear without exudates or hemorrhage Neck: Supple, no carotid bruits. Pulmonary: Clear to auscultation bilaterally   NEUROLOGICAL EXAM:  MENTAL STATUS: Speech/Cognition: Awake, alert, normal speech, oriented to history taking and casual conversation.  CRANIAL NERVES: CN II: Visual fields are full to confrontation.  Pupils are round equal and briskly reactive to light. CN III, IV, VI: extraocular movement are normal. No ptosis. CN V: Facial sensation is intact to light touch. CN VII: Face is symmetric with normal eye closure and smile. CN VIII: Hearing is normal to casual conversation CN IX, X: Palate elevates symmetrically. Phonation is normal. CN XI: Head turning and shoulder shrug are intact CN XII: Tongue is midline with normal movements and no atrophy.  MOTOR: Muscle bulk and tone are normal. Muscle strength is normal.  REFLEXES: Reflexes are 2  and symmetric at the biceps, triceps, knees and ankles. Plantar responses are flexor.  SENSORY: Intact to light touch, pinprick, positional and vibratory sensation at fingers and toes.  COORDINATION: There is no trunk or limb ataxia.    GAIT/STANCE: Need push-up to get up from seated position, leaning forward, cautious   DIAGNOSTIC DATA (LABS, IMAGING, TESTING) - I reviewed patient records, labs, notes, testing and imaging  myself where available.  Lab Results  Component Value Date   WBC 4.3 08/18/2019   HGB 15.4 08/18/2019   HCT 46.4 08/18/2019   MCV 89 08/18/2019   PLT 165 08/18/2019      Component Value Date/Time   NA 138 08/18/2019 1455   K 4.1 08/18/2019  1455   CL 98 08/18/2019 1455   CO2 23 08/18/2019 1455   GLUCOSE 279 (H) 08/18/2019 1455   GLUCOSE 161 (H) 08/14/2019 0804   BUN 20 08/18/2019 1455   CREATININE 0.85 08/18/2019 1455   CALCIUM 9.2 08/18/2019 1455   PROT 6.2 08/18/2019 1455   ALBUMIN 3.9 08/18/2019 1455   AST 24 08/18/2019 1455   ALT 26 08/18/2019 1455   ALKPHOS 79 08/18/2019 1455   BILITOT 0.4 08/18/2019 1455   GFRNONAA 91 08/18/2019 1455   GFRAA 105 08/18/2019 1455   Lab Results  Component Value Date   CHOL 171 02/19/2019   HDL 34.50 (L) 02/19/2019   LDLCALC 111 (H) 02/19/2019   TRIG 128.0 02/19/2019   CHOLHDL 5 02/19/2019   Lab Results  Component Value Date   HGBA1C 10.5 (H) 08/12/2019   Lab Results  Component Value Date   VITAMINB12 264 10/09/2018   Lab Results  Component Value Date   TSH 1.820 08/12/2019    ASSESSMENT AND PLAN 69 y.o. year old male  History of fall Jul 14, 2018, with left convexity subdural hematoma, subarachnoid hemorrhage Normal pressure hydrocephalus, Status post VP shunt placement December 02, 2018 Partial  status epilepticus (recent hospitalization 6/3-6/7 2021.  He has much improved with current management  Complains of daytime fatigue, drowsiness, with polypharmacy treatment, will decrease Keppra to 500/1000  Keep Depakote DR 500 mg 2 tablets twice a day  Repeat EEG  Laboratory evaluations, if he continued to improve, may further decrease Keppra  Marcial Pacas, M.D. Ph.D.  [facility] [place], [place] [address] Phone: [phone] Fax:      [phone]

## 2019-11-30 ENCOUNTER — Other Ambulatory Visit: Payer: Self-pay | Admitting: Physical Medicine and Rehabilitation

## 2019-12-07 ENCOUNTER — Other Ambulatory Visit: Payer: Medicare HMO

## 2019-12-21 ENCOUNTER — Other Ambulatory Visit: Payer: Self-pay

## 2019-12-21 ENCOUNTER — Ambulatory Visit: Payer: Medicare HMO | Admitting: Neurology

## 2019-12-21 DIAGNOSIS — R269 Unspecified abnormalities of gait and mobility: Secondary | ICD-10-CM

## 2019-12-21 DIAGNOSIS — Z8679 Personal history of other diseases of the circulatory system: Secondary | ICD-10-CM

## 2019-12-21 DIAGNOSIS — G40211 Localization-related (focal) (partial) symptomatic epilepsy and epileptic syndromes with complex partial seizures, intractable, with status epilepticus: Secondary | ICD-10-CM | POA: Diagnosis not present

## 2019-12-21 DIAGNOSIS — Z982 Presence of cerebrospinal fluid drainage device: Secondary | ICD-10-CM

## 2019-12-21 DIAGNOSIS — G912 (Idiopathic) normal pressure hydrocephalus: Secondary | ICD-10-CM

## 2019-12-22 NOTE — Procedures (Signed)
   HISTORY: 69 year old male presented with left side subdural hematoma, seizure  TECHNIQUE:  This is a routine 16 channel EEG recording with one channel devoted to a limited EKG recording.  It was performed during wakefulness, drowsiness and asleep.  Hyperventilation and photic stimulation were performed as activating procedures.  There are minimum muscle and movement artifact noted.  Upon maximum arousal, posterior dominant waking rhythm was mildly asymmetric, left side has mixed theta and alpha range activity, right side has fairly rhythmic 8 Hz activity,    Hyperventilation produced mild/moderate buildup with higher amplitude and the slower activities noted.  Photic stimulation did not alter the tracing.  During EEG recording, patient developed drowsiness and no deeper stage of sleep was achieved During EEG recording, there was intermittent T3 T5 spike slow waves  EKG demonstrate sinus rhythm, with heart rate of 56 bpm  CONCLUSION: This is a mild abnormal EEG.  There is electrodiagnostic evidence of intermittent left side slowing, and T3 T5 sharp transient, indicating left hemisphere irritability.  Levert Feinstein, M.D. Ph.D.  [facility] [address] [place], [place] [address] Phone: [phone] Fax:      [phone]

## 2020-01-13 NOTE — Progress Notes (Signed)
I have reviewed and agreed above plan. 

## 2020-03-03 DIAGNOSIS — Z7984 Long term (current) use of oral hypoglycemic drugs: Secondary | ICD-10-CM | POA: Diagnosis not present

## 2020-03-03 DIAGNOSIS — Z961 Presence of intraocular lens: Secondary | ICD-10-CM | POA: Diagnosis not present

## 2020-03-03 DIAGNOSIS — E113293 Type 2 diabetes mellitus with mild nonproliferative diabetic retinopathy without macular edema, bilateral: Secondary | ICD-10-CM | POA: Diagnosis not present

## 2020-03-03 DIAGNOSIS — H524 Presbyopia: Secondary | ICD-10-CM | POA: Diagnosis not present

## 2020-03-07 ENCOUNTER — Encounter: Payer: Self-pay | Admitting: Neurology

## 2020-03-07 ENCOUNTER — Other Ambulatory Visit: Payer: Self-pay

## 2020-03-07 ENCOUNTER — Ambulatory Visit: Payer: Medicare HMO | Admitting: Neurology

## 2020-03-07 VITALS — BP 111/65 | HR 68 | Ht 69.0 in | Wt 212.0 lb

## 2020-03-07 DIAGNOSIS — G40211 Localization-related (focal) (partial) symptomatic epilepsy and epileptic syndromes with complex partial seizures, intractable, with status epilepticus: Secondary | ICD-10-CM

## 2020-03-07 DIAGNOSIS — G912 (Idiopathic) normal pressure hydrocephalus: Secondary | ICD-10-CM

## 2020-03-07 MED ORDER — LAMOTRIGINE 25 MG PO TABS
ORAL_TABLET | ORAL | 0 refills | Status: DC
Start: 2020-03-07 — End: 2020-06-06

## 2020-03-07 NOTE — Patient Instructions (Addendum)
Start Lamictal working up to 100 mg twice daily 1 tablet twice a day for the first week 2 tablets twice a day for the second week 3 tablets twice a day for the third week 4 tablets twice a day for the fourth week  For total of 140 tablets  After finish titration with small dose of lamotrigine 25 mg, change to lamotrigine 100 mg twice a day  Call for new prescription at 100 mg tablets   See you back in 3 months   Lamotrigine tablets What is this medicine? LAMOTRIGINE (la MOE Patrecia Pace) is used to control seizures in adults and children with epilepsy and Lennox-Gastaut syndrome. It is also used in adults to treat bipolar disorder. This medicine may be used for other purposes; ask your health care provider or pharmacist if you have questions. COMMON BRAND NAME(S): Lamictal, Subvenite What should I tell my health care provider before I take this medicine? They need to know if you have any of these conditions:  aseptic meningitis during prior use of lamotrigine  depression  folate deficiency  kidney disease  liver disease  suicidal thoughts, plans, or attempt; a previous suicide attempt by you or a family member  an unusual or allergic reaction to lamotrigine or other seizure medications, other medicines, foods, dyes, or preservatives  pregnant or trying to get pregnant  breast-feeding How should I use this medicine? Take this medicine by mouth with a glass of water. Follow the directions on the prescription label. Do not chew these tablets. If this medicine upsets your stomach, take it with food or milk. Take your doses at regular intervals. Do not take your medicine more often than directed. A special MedGuide will be given to you by the pharmacist with each new prescription and refill. Be sure to read this information carefully each time. Talk to your pediatrician regarding the use of this medicine in children. While this drug may be prescribed for children as young as 2 years  for selected conditions, precautions do apply. Overdosage: If you think you have taken too much of this medicine contact a poison control center or emergency room at once. NOTE: This medicine is only for you. Do not share this medicine with others. What if I miss a dose? If you miss a dose, take it as soon as you can. If it is almost time for your next dose, take only that dose. Do not take double or extra doses. What may interact with this medicine?  atazanavir  carbamazepine  male hormones, including contraceptive or birth control pills  lopinavir  methotrexate  phenobarbital  phenytoin  primidone  pyrimethamine  rifampin  ritonavir  trimethoprim  valproic acid This list may not describe all possible interactions. Give your health care provider a list of all the medicines, herbs, non-prescription drugs, or dietary supplements you use. Also tell them if you smoke, drink alcohol, or use illegal drugs. Some items may interact with your medicine. What should I watch for while using this medicine? Visit your doctor or health care provider for regular checks on your progress. If you take this medicine for seizures, wear a Medic Alert bracelet or necklace. Carry an identification card with information about your condition, medicines, and doctor or health care provider. It is important to take this medicine exactly as directed. When first starting treatment, your dose will need to be adjusted slowly. It may take weeks or months before your dose is stable. You should contact your doctor or health care  provider if your seizures get worse or if you have any new types of seizures. Do not stop taking this medicine unless instructed by your doctor or health care provider. Stopping your medicine suddenly can increase your seizures or their severity. This medicine may cause serious skin reactions. They can happen weeks to months after starting the medicine. Contact your health care provider  right away if you notice fevers or flu-like symptoms with a rash. The rash may be red or purple and then turn into blisters or peeling of the skin. Or, you might notice a red rash with swelling of the face, lips or lymph nodes in your neck or under your arms. You may get drowsy, dizzy, or have blurred vision. Do not drive, use machinery, or do anything that needs mental alertness until you know how this medicine affects you. To reduce dizzy or fainting spells, do not sit or stand up quickly, especially if you are an older patient. Alcohol can increase drowsiness and dizziness. Avoid alcoholic drinks. If you are taking this medicine for bipolar disorder, it is important to report any changes in your mood to your doctor or health care provider. If your condition gets worse, you get mentally depressed, feel very hyperactive or manic, have difficulty sleeping, or have thoughts of hurting yourself or committing suicide, you need to get help from your health care provider right away. If you are a caregiver for someone taking this medicine for bipolar disorder, you should also report these behavioral changes right away. The use of this medicine may increase the chance of suicidal thoughts or actions. Pay special attention to how you are responding while on this medicine. Your mouth may get dry. Chewing sugarless gum or sucking hard candy, and drinking plenty of water may help. Contact your doctor if the problem does not go away or is severe. Women who become pregnant while using this medicine may enroll in the [place] [employer] Antiepileptic Drug Pregnancy Registry by calling [phone]. This registry collects information about the safety of antiepileptic drug use during pregnancy. This medicine may cause a decrease in folic acid. You should make sure that you get enough folic acid while you are taking this medicine. Discuss the foods you eat and the vitamins you take with your health care provider. What side  effects may I notice from receiving this medicine? Side effects that you should report to your doctor or health care professional as soon as possible:  allergic reactions like skin rash, itching or hives, swelling of the face, lips, or tongue  changes in vision  depressed mood  elevated mood, decreased need for sleep, racing thoughts, impulsive behavior  loss of balance or coordination  mouth sores  rash, fever, and swollen lymph nodes  redness, blistering, peeling or loosening of the skin, including inside the mouth  right upper belly pain  seizures  severe muscle pain  signs and symptoms of aseptic meningitis such as stiff neck and sensitivity to light, headache, drowsiness, fever, nausea, vomiting, rash  signs of infection - fever or chills, cough, sore throat, pain or difficulty passing urine  suicidal thoughts or other mood changes  swollen lymph nodes  trouble walking  unusual bruising or bleeding  unusually weak or tired  yellowing of the eyes or skin Side effects that usually do not require medical attention (report to your doctor or health care professional if they continue or are bothersome):  diarrhea  dizziness  dry mouth  stuffy nose  tiredness  tremors  trouble sleeping This list may not describe all possible side effects. Call your doctor for medical advice about side effects. You may report side effects to FDA at 1-800-FDA-1088. Where should I keep my medicine? Keep out of reach of children. Store at room temperature between 15 and 30 degrees C (59 and 86 degrees F). Throw away any unused medicine after the expiration date. NOTE: This sheet is a summary. It may not cover all possible information. If you have questions about this medicine, talk to your doctor, pharmacist, or health care provider.  2020 [employer]/Gold Standard (2018-05-30 15:03:40)

## 2020-03-07 NOTE — Progress Notes (Signed)
PATIENT: [name] Frank. DOB: [birth year]  REASON FOR VISIT: follow up HISTORY FROM: patient  HISTORY OF PRESENT ILLNESS: Today 03/07/20  HISTORY  [name] Frankis a 69 year old male, seen in request by his primary care physician Dr. Lamar Blinks for evaluation of possible seizure, history of subdural hematoma, initial evaluation was on October 09, 2018.  I have reviewed and summarized the referring note from the referring physician. He had a past medical history of hypertension, type 2 diabetes, coronary artery disease, paroxysmal atrial fibrillation, he had an unwitnessed fall at the bottom of the stairs, was brought to the emergency room on Jul 14, 2018, I personally reviewed CT head, acute subdural hematoma along the left cerebral convexity measuring up to 11 mm in thickness, patchy subarachnoid hemorrhage with a traumatic pattern, negative for cervical spine fracture. 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 He denies gait abnormality, memory loss at that time.  He readmitted to the hospital on Jul 28, 2018 for more somnolent, slept all day on May 16 sudden onset garbled speech, right facial droop, transient confusion, emergency room blood pressure 90/53, CT head showed developing community hydrocephalus, likely due to recent subarachnoid and intraventricular hemorrhage, no surgical intervention was indicated per neurosurgery evaluation, suspicious for complex partial seizure, he was discharged with Keppra 500 mg twice a day  Since recent hospital admission, there was no clinical seizure, but he was noted significant memory loss, he states, repeating questions, slept a  lot, lost control of his urine, also has mild gait abnormality,  He is always a slow walker, he tends to wait on him, by reviewing the initial CT scan on Jul 14, 2018, there is already some evidence of hydrocephalus, enlarged bilateral lateral ventricle,  I personally reviewed MRI of the brain on Jul 29, 2018, persistent ventriculomegaly, suspected developing communicating hydrocephalus, no evidence of acute stroke, 10 mm left subdural hematoma, with no mass-effect on the hemisphere,  Laboratory evaluations showed normal ESR, C-reactive protein, ANA, HIV, RPR, B12, A1c was 7.2, hemoglobin was 12.8,   Update November 19, 2018: He continues to have significant memory loss, gait abnormality, worsening urinary incontinence, this is all new since May 2020, he retired as an Engineer, 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[employer], later worked as a Microbiologist, just finished a complicated project in April 2020, now he has difficulty operating coffee machine, difficulty initiating his gait, fell few times,  We have personally reviewed MRI CT scans, compared with multiple scans he had a since May 2020, most recent MRI brain November 15, 2018: Resolution of left subdural hematoma, ventriculomegaly out of proportion to the extent of mild generalized cortical atrophy, additionally there appears to be mild transependymal flow, 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 Zada Finders, will see him on September 9  He is tolerating Keppra 500 mg twice daily well, there was no recurrent seizure  He has tried Sinemet 25/100 mg 3 times daily, there was no significant improvement noted  UPDATE Oct 6th  2020: I reviewed operation record by Dr. Zada Finders, he had shunt replacement December 02, 2018.  He was seen by  physical therapy on December 03, 2018, the day postsurgical, patient with significant posterior lean, poor trunk control, required bilateral upper extremity support, needs assistance, he reported overall 60% improvement in his walking, urinary frequency and urgency has much improved, still has mild memory loss  I reviewed CT head December 03, 2018: Operative changes from interval right bur hole craniotomy for VP shunt placement,  UPDATE Mar 17 2019: I personally reviewed CT head on Mar 01 2020:Slight further decrease in lateral ventricle size since October.Stable right superior frontal approach shunt. No adverse features. He is overall doing well, wife reported that he is back to his baseline, he is sedentary, is no longer taking Keppra 500 mg twice daily, we went over the initial event in May 2020, when he had unwitnessed fall at the ground level, but with resulting subdural hematoma, he had no recollection of the fall, possibility including seizure, after discussion, we decided to proceed with Keppra 500 mg twice a day  Lab in Dec 2020: LDL 111, CMP, glucose 299, creat 0.78, A1C 9.5  UPDATE Sept 13 2021: Patient presented to emergency room on August 11, 2019 for aphasia, word finding difficulties, left facial droop,  Personally reviewed MRI of the brain on August 12, 2019, no acute abnormality, right frontal VP shunt, stable size,  CT angiogram of head and neck showed no significant large vessel disease, intracranial atherosclerotic disease,  Following her hospital discharge she continued to have intermittent slurred speech, right facial droop, mixed with lucid time, he also increased confusion to the point of urinary incontinence, stat EEG showed left hemispheric rhythmic slowing, consistent with his partial status epilepticus,  He was loaded with Depacon IV, and was sent to emergency room, placed on Depakote DR 750 mg 3 times a day, later switched to ER 1000 mg twice a day, along with  Keppra 1000 mg twice daily Keppra level 11.9, TSH 1.820, A1C 10.5.   EEG by Dr. Hortense Ramal on August 14, 2019 showed left frontotemporal region rhythmic delta activity, a total of interictal continuum, indicating potential epileptogenicity.  Repeat EEG on August 15, 2019 is much improved with higher dose of anti-epileptic medications, also correlate with clinical improvement, I also reviewed cardiology visit on August 28, 2019 by Dr. Kathreen Devoid Rohr[place], coronary artery disease involving native coronary artery without angina pectoris, hypertension, paroxysmal atrial fibrillation, uncontrolled diabetes with microalbuminemia, mixed hyperlipidemia,  Dr. Marijo File think patient is doing quite well from cardiac standpoint, he has been off aspirin since summer of 2020, now he has gone back on aspirin 81 mg daily,  Update March 07, 2020 SS: EEG in October 2021, mildly abnormal, evidence of intermittent left-sided slowing, and T3, T5 sharp transient indicating left hemisphere irritability. In sept, Keppra reduced 500/1000 mg due to side effect.  He stopped Keppra and Depakote 1 month ago, reported "intolerable" side effects of diarrhea, heart burn, since starting Depakote.  He in fact stopped all his medications, including Metformin, which he has been on for years.  No seizures reported, diarrhea has gone away.  Has been acting normal per wife.  No falls, uses a cane just in case.  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  [place] every day.  REVIEW OF SYSTEMS: Out of a complete 14 system review of symptoms, the patient complains only of the following symptoms, and all other  reviewed systems are negative.  Seizures  ALLERGIES: No Known Allergies  HOME MEDICATIONS: Outpatient Medications Prior to Visit  Medication Sig Dispense Refill   atorvastatin (LIPITOR) 80 MG tablet Take 1 tablet (80 mg total) by mouth daily. (Patient taking differently: Take 80 mg by mouth every  evening.) 90 tablet 3   divalproex (DEPAKOTE) 500 MG DR tablet Take 2 tablets (1,000 mg total) by mouth every 12 (twelve) hours. 180 tablet 4   glipiZIDE (GLUCOTROL XL) 10 MG 24 hr tablet Take 1 tablet (10 mg total) by mouth 2 (two) times daily. 180 tablet 3   levETIRAcetam (KEPPRA) 500 MG tablet 553m qam, 10024mqhs 270 tablet 4   metFORMIN (GLUCOPHAGE) 1000 MG tablet Take 1 tablet (1,000 mg total) by mouth 2 (two) times daily with a meal. 180 tablet 3   metoprolol succinate (TOPROL XL) 25 MG 24 hr tablet Take 1 tablet (25 mg total) by mouth daily. 30 tablet 1   No facility-administered medications prior to visit.    PAST MEDICAL HISTORY: Past Medical History:  Diagnosis Date   Abnormality of gait    uses walker and wheelchair   [facility]   Coronary artery disease    cardiologist-- dr s. roJake Bathe-  04-06-2014  NSTEMI  s/p  cardiac cath DES x1 to LAD and POBA to D1   Dysrhythmia    History of non-ST elevation myocardial infarction (NSTEMI)    04-06-2013  s/p  coronary stent and PCI   Hydrocele, bilateral    Hyperlipemia    Hypertension    Lower urinary tract symptoms (LUTS)    Memory deficit    PAF (paroxysmal atrial fibrillation) ([facility]-- dr roJake Bathe [facility],  with cardizem/ toprol, back in NSR,  f/u by cardiologist , event monitor 09-16-2018 no results in epic by per pt wife was told result ok with no afib   S/P drug eluting coronary stent placement    04-06-2014  @[facility]   DES x1 to LAD and POBA to D1   [facility]a [facility]- last head CT 10-01-2018 resolving (10-23-2018  residual deficits memory issues, abnormal gait, congnitive   w/ SDH due to unwitnessed fall, traumatic head injury---- admission 07-14-2018 , d/c'd 08-01-2018,  readmitted 3 days later with late TBI effects,  d/c'd from rehab 08-22-2018   [facility]  [facility]   followed by dr yaKrista Blue Subdural hematoma ([facility]   Type 2 diabetes mellitus ([facility]   followed by pcp   Urinary incontinence    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    PAST SURGICAL HISTORY: Past Surgical History:  Procedure Laterality Date   CATARACT EXTRACTION W/ INTRAOCULAR LENS  IMPLANT, BILATERAL  2008; 2009   CORONARY ANGIOPLASTY WITH STENT PLACEMENT  04-06-2014  @[facility]    DES x1 to LAD and POBA to D1   HYDROCELE EXCISION Bilateral 10/27/2018   Procedure: HYDROCELECTOMY ADULT;  Surgeon: OtKathie RhodesMD;  [facility];  Laterality: Bilateral;   TONSILLECTOMY  child   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   Procedure: Right ventriculoperitoneal shunt placement;  Surgeon: OsJudith PartMD;  [facility];  Laterality: Right;    FAMILY HISTORY: Family History  Problem Relation Age of Onset   Stroke Father    Hypertension Father    Hyperlipidemia Father    Early  death Mother    Colitis Sister    Appendicitis Brother     SOCIAL HISTORY: Social History   Socioeconomic History   Marital status: Married    Spouse name: Not on file   Number of children: 2   Years of education: college   Highest education level: Master's degree (e.g., MA, MS, MEng, MEd, MSW, MBA)  Occupational History   Occupation: Retired  Tobacco Use   Smoking status: Never Smoker   Smokeless tobacco: Never Used  Scientific laboratory technician Use: Never used  Substance and Sexual Activity   Alcohol use: Not Currently    Comment: VERY RARELY   Drug use: Never   Sexual activity: Not on file  Other Topics Concern   Not on file  Social History Narrative   Lives at home with his wife.   Right-handed.   Limited caffeine use.   Social Determinants of Health   Financial Resource Strain: Not on file  Food Insecurity: Not on file  Transportation Needs: Not on file  Physical Activity: Not on file   Stress: Not on file  Social Connections: Not on file  Intimate Partner Violence: Not on file   PHYSICAL EXAM  Vitals:   03/07/20 1440  BP: 111/65  Pulse: 68  Weight: 212 lb (96.2 kg)  Height: 5' 9"  (1.753 m)   Body mass index is 31.31 kg/m.  Generalized: Well developed, in no acute distress   Neurological examination  Mentation: Alert oriented to time, place, history taking. Follows all commands speech and language fluent Cranial nerve II-XII: Pupils were equal round reactive to light. Extraocular movements were full, visual field were full on confrontational test. Facial sensation and strength were normal. Head turning and shoulder shrug  were normal and symmetric. Motor: The motor testing reveals 5 over 5 strength of all 4 extremities. Good symmetric motor tone is noted throughout.  Sensory: Sensory testing is intact to soft touch on all 4 extremities. No evidence of extinction is noted.  Coordination: Cerebellar testing reveals good finger-nose-finger and heel-to-shin bilaterally.  Gait and station: Gait is forward leaning, wide based, cautious Reflexes: Deep tendon reflexes are symmetric and normal bilaterally.   DIAGNOSTIC DATA (LABS, IMAGING, TESTING) - I reviewed patient records, labs, notes, testing and imaging myself where available.  Lab Results  Component Value Date   WBC 4.3 08/18/2019   HGB 15.4 08/18/2019   HCT 46.4 08/18/2019   MCV 89 08/18/2019   PLT 165 08/18/2019      Component Value Date/Time   NA 138 08/18/2019 1455   K 4.1 08/18/2019 1455   CL 98 08/18/2019 1455   CO2 23 08/18/2019 1455   GLUCOSE 279 (H) 08/18/2019 1455   GLUCOSE 161 (H) 08/14/2019 0804   BUN 20 08/18/2019 1455   CREATININE 0.85 08/18/2019 1455   CALCIUM 9.2 08/18/2019 1455   PROT 6.2 08/18/2019 1455   ALBUMIN 3.9 08/18/2019 1455   AST 24 08/18/2019 1455   ALT 26 08/18/2019 1455   ALKPHOS 79 08/18/2019 1455   BILITOT 0.4 08/18/2019 1455   GFRNONAA 91 08/18/2019 1455    GFRAA 105 08/18/2019 1455   Lab Results  Component Value Date   CHOL 171 02/19/2019   HDL 34.50 (L) 02/19/2019   LDLCALC 111 (H) 02/19/2019   TRIG 128.0 02/19/2019   CHOLHDL 5 02/19/2019   Lab Results  Component Value Date   HGBA1C 10.5 (H) 08/12/2019   Lab Results  Component Value Date   VITAMINB12 264 10/09/2018  Lab Results  Component Value Date   TSH 1.820 08/12/2019   ASSESSMENT AND PLAN 69 y.o. year old male  has a past medical history of Abnormality of gait, Cognitive deficit as late effect of traumatic brain injury ([place]), Coronary artery disease, Dysrhythmia, History of non-ST elevation myocardial infarction (NSTEMI), Hydrocele, bilateral, Hyperlipemia, Hypertension, Lower urinary tract symptoms (LUTS), Memory deficit, PAF (paroxysmal atrial fibrillation) ([place]) (cardilogist-- dr Jake Bathe), S/P drug eluting coronary stent placement, SAH (subarachnoid hemorrhage) ([place]) (neurologist-  dr Krista Blue--- last head CT 10-01-2018 resolving (10-23-2018  residual deficits memory issues, abnormal gait, congnitive), Seizure disorder ([place]), Subdural hematoma ([place]), Type 2 diabetes mellitus ([place]), and Urinary incontinence. here with:  1.  History of fall on Jul 14, 2018 with left convexity subdural hematoma, subarachnoid hemorrhage 2.  Normal pressure hydrocephalus, status post VP shunt placement December 02, 2018 3.  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) -EEG in October 2011 mildly abnormal, evidence of intermittent left-sided slowing and T3, T5 sharp transient indicating left hemisphere irritability -Stopped all his seizure medications 1 month ago due to side effect diarrhea, heartburn, likely related to Depakote; felt daytime fatigue/drowsiness with Keppra -Start Lamictal 25 mg tablet, working up to 100 mg twice a day for seizure prevention -Would recommend refraining from driving at least until up on full dose Lamictal given history of seizure activity/EEG findings -Call for  seizure activity, follow-up in 3 months or sooner if needed  I spent 30 minutes of face-to-face and non-face-to-face time with patient.  This included previsit chart review, lab review, study review, order entry, electronic health record documentation, patient education.  Butler Denmark, AGNP-C, DNP 03/07/2020, 2:47 PM [facility] [address] [place], [place] [address] [phone]

## 2020-04-14 NOTE — Progress Notes (Signed)
I have reviewed and agreed above plan. 

## 2020-04-19 DIAGNOSIS — E119 Type 2 diabetes mellitus without complications: Secondary | ICD-10-CM | POA: Diagnosis not present

## 2020-04-19 DIAGNOSIS — I10 Essential (primary) hypertension: Secondary | ICD-10-CM | POA: Diagnosis not present

## 2020-04-19 DIAGNOSIS — E782 Mixed hyperlipidemia: Secondary | ICD-10-CM | POA: Diagnosis not present

## 2020-04-19 DIAGNOSIS — I480 Paroxysmal atrial fibrillation: Secondary | ICD-10-CM | POA: Diagnosis not present

## 2020-04-19 DIAGNOSIS — I2510 Atherosclerotic heart disease of native coronary artery without angina pectoris: Secondary | ICD-10-CM | POA: Diagnosis not present

## 2020-06-06 ENCOUNTER — Ambulatory Visit: Payer: Medicare HMO | Admitting: Neurology

## 2020-06-06 ENCOUNTER — Encounter: Payer: Self-pay | Admitting: Neurology

## 2020-06-06 ENCOUNTER — Telehealth: Payer: Self-pay | Admitting: Neurology

## 2020-06-06 ENCOUNTER — Other Ambulatory Visit: Payer: Self-pay

## 2020-06-06 VITALS — BP 126/69 | HR 76 | Ht 69.0 in | Wt 208.0 lb

## 2020-06-06 DIAGNOSIS — G912 (Idiopathic) normal pressure hydrocephalus: Secondary | ICD-10-CM | POA: Diagnosis not present

## 2020-06-06 DIAGNOSIS — G40211 Localization-related (focal) (partial) symptomatic epilepsy and epileptic syndromes with complex partial seizures, intractable, with status epilepticus: Secondary | ICD-10-CM

## 2020-06-06 MED ORDER — LAMOTRIGINE 25 MG PO TABS: ORAL_TABLET | ORAL | 0 refills | Status: DC

## 2020-06-06 MED ORDER — LAMOTRIGINE 100 MG PO TABS: 100.0000 mg | ORAL_TABLET | Freq: Two times a day (BID) | ORAL | 11 refills | Status: DC

## 2020-06-06 NOTE — Patient Instructions (Signed)
Start Lamictal titration working up to 100 mg twice daily. You will start with the 25 mg titration dose, then fill the 2nd prescription for 100 mg once you complete the titration   1 tablet twice a day for the first week 2 tablets twice a day for the second week 3 tablets twice a day for the third week 4 tablets twice a day for the fourth week  For total of 140 tablets  After finish titration with small dose of lamotrigine 25 mg, change to lamotrigine 100 mg twice a day  I will get the records from your eye doctor  Recommend no driving until established on seizure medications   May get your records from your eye doctor, or refer you to new eye doctor   Lamotrigine tablets What is this medicine? LAMOTRIGINE (la MOE Patrecia Pace) is used to control seizures in adults and children with epilepsy and Lennox-Gastaut syndrome. It is also used in adults to treat bipolar disorder. This medicine may be used for other purposes; ask your health care provider or pharmacist if you have questions. COMMON BRAND NAME(S): Lamictal, Subvenite What should I tell my health care provider before I take this medicine? They need to know if you have any of these conditions:  heart disease  history of irregular heartbeat  immune system problems  kidney disease  liver disease  low levels of folic acid in the blood  lupus  mental illness  suicidal thoughts, plans, or attempt; a previous suicide attempt by you or a family member  an unusual or allergic reaction to lamotrigine or other seizure medications, other medicines, foods, dyes, or preservatives  pregnant or trying to get pregnant  breast-feeding How should I use this medicine? Take this medicine by mouth with a glass of water. Follow the directions on the prescription label. Do not chew these tablets. If this medicine upsets your stomach, take it with food or milk. Take your doses at regular intervals. Do not take your medicine more often than  directed. A special MedGuide will be given to you by the pharmacist with each new prescription and refill. Be sure to read this information carefully each time. Talk to your pediatrician regarding the use of this medicine in children. While this drug may be prescribed for children as young as 2 years for selected conditions, precautions do apply. Overdosage: If you think you have taken too much of this medicine contact a poison control center or emergency room at once. NOTE: This medicine is only for you. Do not share this medicine with others. What if I miss a dose? If you miss a dose, take it as soon as you can. If it is almost time for your next dose, take only that dose. Do not take double or extra doses. What may interact with this medicine?  atazanavir  birth control pills  certain medicines for irregular heartbeat  certain medicines for seizures like carbamazepine, phenobarbital, phenytoin, primidone, valproic acid  lopinavir  rifampin  ritonavir This list may not describe all possible interactions. Give your health care provider a list of all the medicines, herbs, non-prescription drugs, or dietary supplements you use. Also tell them if you smoke, drink alcohol, or use illegal drugs. Some items may interact with your medicine. What should I watch for while using this medicine? Visit your doctor or health care providerfor regular checks on your progress. If you take this medicine for seizures, wear a Medic Alert bracelet or necklace. Carry an identification card with  information about your condition, medicines, and doctor or health care provider. It is important to take this medicine exactly as directed. When first starting treatment, your dose will need to be adjusted slowly. It may take weeks or months before your dose is stable. You should contact your doctor or health care providerif your seizures get worse or if you have any new types of seizures. Do not stop taking this medicine  unless instructed by your doctor or health care provider. Stopping your medicine suddenly can increase your seizures or their severity. This medicine may cause serious skin reactions. They can happen weeks to months after starting the medicine. Contact your health care provider right away if you notice fevers or flu-like symptoms with a rash. The rash may be red or purple and then turn into blisters or peeling of the skin. Or, you might notice a red rash with swelling of the face, lips or lymph nodes in your neck or under your arms. You may get drowsy, dizzy, or have blurred vision. Do not drive, use machinery, or do anything that needs mental alertness until you know how this medicine affects you. To reduce dizzy or fainting spells, do not sit or stand up quickly, especially if you are an older patient. Alcohol can increase drowsiness and dizziness. Avoid alcoholic drinks. If you are taking this medicine for bipolar disorder, it is important to report any changes in your mood to your doctor or health care provider. If your condition gets worse, you get mentally depressed, feel very hyperactive or manic, have difficulty sleeping, or have thoughts of hurting yourself or committing suicide, you need to get help from your health care providerright away. If you are a caregiver for someone taking this medicine for bipolar disorder, you should also report these behavioral changes right away. The use of this medicine may increase the chance of suicidal thoughts or actions. Pay special attention to how you are responding while on this medicine. Your mouth may get dry. Chewing sugarless gum or sucking hard candy, and drinking plenty of water may help. Contact your doctor if the problem does not go away or is severe. Women who become pregnant while using this medicine may enroll in the [place] [employer] Antiepileptic Drug Pregnancy Registry by calling [phone]. This registry collects information about the safety of  antiepileptic drug use during pregnancy. This medicine may cause a decrease in folic acid. You should make sure that you get enough folic acid while you are taking this medicine. Discuss the foods you eat and the vitamins you take with your health care provider. What side effects may I notice from receiving this medicine? Side effects that you should report to your doctor or health care professional as soon as possible:  allergic reactions (skin rash, itching or hives; swelling of the face, lips, or tongue)  aseptic meningitis (stiff neck; sensitivity to light; headache; drowsiness; fever; nausea, vomiting; rash)  changes in vision  heartbeat rhythm changes (trouble breathing; chest pain; dizziness; fast, irregular heartbeat; feeling faint or lightheaded, falls)  infection (fever, chills, cough, sore throat, pain or trouble passing urine)  liver injury (dark yellow or brown urine; general ill feeling or flu-like symptoms; loss of appetite, right upper belly pain; unusually weak or tired, yellowing of the eyes or skin)  low red blood cell counts (trouble breathing; feeling faint; lightheaded, falls; unusually weak or tired)  rash, fever, and swollen lymph nodes  redness, blistering, peeling, or loosening of the skin, including inside the mouth  seizures  suicidal thoughts, mood changes  unusual bruising or bleeding Side effects that usually do not require medical attention (report to your doctor or health care professional if they continue or are bothersome):  diarrhea  dizziness  nausea  stomach pain  tremors  vomiting This list may not describe all possible side effects. Call your doctor for medical advice about side effects. You may report side effects to FDA at 1-800-FDA-1088. Where should I keep my medicine? Keep out of the reach of children and pets. Store at [employer] [facility] (77 degrees F). Protect from light. Get rid of any unused medicine after the expiration date. To  get rid of medicines that are no longer needed or have expired:  Take the medicine to a medicine take-back program. Check with your pharmacy or law enforcement to find a location.  If you cannot return the medicine, check the label or package insert to see if the medicine should be thrown out in the garbage or flushed down the toilet. If you are not sure, ask your health care provider. If it is safe to put it in the trash, empty the medicine out of the container. Mix the medicine with cat litter, dirt, coffee grounds, or other unwanted substance. Seal the mixture in a bag or container. Put it in the trash. NOTE: This sheet is a summary. It may not cover all possible information. If you have questions about this medicine, talk to your doctor, pharmacist, or health care provider.  2021 [employer] (2019-06-11 10:26:40)

## 2020-06-06 NOTE — Telephone Encounter (Signed)
I spoke to his wife and scheduled a follow up with Dr. Terrace Arabia on 06/20/20.

## 2020-06-06 NOTE — Progress Notes (Addendum)
PATIENT: [name] Frank. DOB: 1950-10-31  REASON FOR VISIT: follow up HISTORY FROM: patient  HISTORY OF PRESENT ILLNESS: Today 06/06/20  HISTORY  [name] Frankis a 70 year old male, seen in request by his primary care physician Dr. Lamar Blinks for evaluation of possible seizure, history of subdural hematoma, initial evaluation was on October 09, 2018.  I have reviewed and summarized the referring note from the referring physician. He had a past medical history of hypertension, type 2 diabetes, coronary artery disease, paroxysmal atrial fibrillation, he had an unwitnessed fall at the bottom of the stairs, was brought to the emergency room on Jul 14, 2018, I personally reviewed CT head, acute subdural hematoma along the left cerebral convexity measuring up to 11 mm in thickness, patchy subarachnoid hemorrhage with a traumatic pattern, negative for cervical spine fracture. 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 He denies gait abnormality, memory loss at that time.  He readmitted to the hospital on Jul 28, 2018 for more somnolent, slept all day on May 16 sudden onset garbled speech, right facial droop, transient confusion, emergency room blood pressure 90/53, CT head showed developing community hydrocephalus, likely due to recent subarachnoid and intraventricular hemorrhage, no surgical intervention was indicated per neurosurgery evaluation, suspicious for complex partial seizure, he was discharged with Keppra 500 mg twice a day  Since recent hospital admission, there was no clinical seizure, but he was noted significant memory loss, he states, repeating questions, slept a  lot, lost control of his urine, also has mild gait abnormality,  He is always a slow walker, he tends to wait on him, by reviewing the initial CT scan on Jul 14, 2018, there is already some evidence of hydrocephalus, enlarged bilateral lateral ventricle,  I personally reviewed MRI of the brain on Jul 29, 2018, persistent ventriculomegaly, suspected developing communicating hydrocephalus, no evidence of acute stroke, 10 mm left subdural hematoma, with no mass-effect on the hemisphere,  Laboratory evaluations showed normal ESR, C-reactive protein, ANA, HIV, RPR, B12, A1c was 7.2, hemoglobin was 12.8,   Update November 19, 2018: He continues to have significant memory loss, gait abnormality, worsening urinary incontinence, this is all new since May 2020, he retired as an Engineer, 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[employer], later worked as a Microbiologist, just finished a complicated project in April 2020, now he has difficulty operating coffee machine, difficulty initiating his gait, fell few times,  We have personally reviewed MRI CT scans, compared with multiple scans he had a since May 2020, most recent MRI brain November 15, 2018: Resolution of left subdural hematoma, ventriculomegaly out of proportion to the extent of mild generalized cortical atrophy, additionally there appears to be mild transependymal flow, 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 Zada Finders, will see him on September 9  He is tolerating Keppra 500 mg twice daily well, there was no recurrent seizure  He has tried Sinemet 25/100 mg 3 times daily, there was no significant improvement noted  UPDATE Oct 6th  2020: I reviewed operation record by Dr. Zada Finders, he had shunt replacement December 02, 2018.  He was seen by  physical therapy on December 03, 2018, the day postsurgical, patient with significant posterior lean, poor trunk control, required bilateral upper extremity support, needs assistance, he reported overall 60% improvement in his walking, urinary frequency and urgency has much improved, still has mild memory loss  I reviewed CT head December 03, 2018: Operative changes from interval right bur hole craniotomy for VP shunt placement,  UPDATE Mar 17 2019: I personally reviewed CT head on Mar 01 2020:Slight further decrease in lateral ventricle size since October.Stable right superior frontal approach shunt. No adverse features. He is overall doing well, wife reported that he is back to his baseline, he is sedentary, is no longer taking Keppra 500 mg twice daily, we went over the initial event in May 2020, when he had unwitnessed fall at the ground level, but with resulting subdural hematoma, he had no recollection of the fall, possibility including seizure, after discussion, we decided to proceed with Keppra 500 mg twice a day  Lab in Dec 2020: LDL 111, CMP, glucose 299, creat 0.78, A1C 9.5  UPDATE Sept 13 2021: Patient presented to emergency room on August 11, 2019 for aphasia, word finding difficulties, left facial droop,  Personally reviewed MRI of the brain on August 12, 2019, no acute abnormality, right frontal VP shunt, stable size,  CT angiogram of head and neck showed no significant large vessel disease, intracranial atherosclerotic disease,  Following her hospital discharge she continued to have intermittent slurred speech, right facial droop, mixed with lucid time, he also increased confusion to the point of urinary incontinence, stat EEG showed left hemispheric rhythmic slowing, consistent with his partial status epilepticus,  He was loaded with Depacon IV, and was sent to emergency room, placed on Depakote DR 750 mg 3 times a day, later switched to ER 1000 mg twice a day, along with  Keppra 1000 mg twice daily Keppra level 11.9, TSH 1.820, A1C 10.5.   EEG by Dr. Hortense Ramal on August 14, 2019 showed left frontotemporal region rhythmic delta activity, a total of interictal continuum, indicating potential epileptogenicity.  Repeat EEG on August 15, 2019 is much improved with higher dose of anti-epileptic medications, also correlate with clinical improvement, I also reviewed cardiology visit on August 28, 2019 by Dr. Kathreen Devoid Rohrbeck, coronary artery disease involving native coronary artery without angina pectoris, hypertension, paroxysmal atrial fibrillation, uncontrolled diabetes with microalbuminemia, mixed hyperlipidemia,  Dr. Marijo File think patient is doing quite well from cardiac standpoint, he has been off aspirin since summer of 2020, now he has gone back on aspirin 81 mg daily,  Update March 07, 2020 SS: EEG in October 2021, mildly abnormal, evidence of intermittent left-sided slowing, and T3, T5 sharp transient indicating left hemisphere irritability. In sept, Keppra reduced 500/1000 mg due to side effect.  He stopped Keppra and Depakote 1 month ago, reported "intolerable" side effects of diarrhea, heart burn, since starting Depakote.  He in fact stopped all his medications, including Metformin, which he has been on for years.  No seizures reported, diarrhea has gone away.  Has been acting normal per wife.  No falls, uses a cane just in case.  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  [place] every day.  Update June 06, 2020 SS: Here today alone, isn't taking any seizure medications, never started the Lamictal. Wanted to give his body a  rest, if he takes everything he has terrible heart burn, indigestion, diarrhea. Off metformin, on glipizide. No known seizures. Wife drove him here today, sat in the car. No longer having diarrhea. Has been driving some.  No falls, uses a cane just in case.  Has been at baseline, reading the newspaper, is  well appearing, well dressed and groomed today. Did note left pupil is 5 mm sluggish, right is 3 mm brisker, not sure if this is baseline, has had bilateral cataract surgery. The left eyelid is droopy at baseline. Claims no changes to vision.   REVIEW OF SYSTEMS: Out of a complete 14 system review of symptoms, the patient complains only of the following symptoms, and all other reviewed systems are negative.  Seizures  ALLERGIES: No Known Allergies  HOME MEDICATIONS: Outpatient Medications Prior to Visit  Medication Sig Dispense Refill  . atorvastatin (LIPITOR) 80 MG tablet Take 1 tablet (80 mg total) by mouth daily. (Patient taking differently: Take 80 mg by mouth every evening.) 90 tablet 3  . glipiZIDE (GLUCOTROL XL) 10 MG 24 hr tablet Take 1 tablet (10 mg total) by mouth 2 (two) times daily. 180 tablet 3  . metFORMIN (GLUCOPHAGE) 1000 MG tablet Take 1 tablet (1,000 mg total) by mouth 2 (two) times daily with a meal. 180 tablet 3  . metoprolol succinate (TOPROL XL) 25 MG 24 hr tablet Take 1 tablet (25 mg total) by mouth daily. 30 tablet 1  . lamoTRIgine (LAMICTAL) 25 MG tablet 1 tablet twice a day for the first week 2 tablets twice a day for the second week 3 tablets twice a day for the third week 4 tablets twice a day for the fourth week  For total of 140 tablets  After finish titration with small dose of lamotrigine 25 mg, change to lamotrigine 100 mg twice a day 140 tablet 0   No facility-administered medications prior to visit.    PAST MEDICAL HISTORY: Past Medical History:  Diagnosis Date  . Abnormality of gait    uses walker and wheelchair  . Cognitive deficit as late effect of traumatic brain injury ([place])   . Coronary artery disease    cardiologist-- dr s. Jake Bathe---  04-06-2014  NSTEMI  s/p  cardiac cath DES x1 to LAD and POBA to D1  . Dysrhythmia   . History of non-ST elevation myocardial infarction (NSTEMI)    04-06-2013  s/p  coronary stent and PCI  . Hydrocele,  bilateral   . Hyperlipemia   . Hypertension   . Lower urinary tract symptoms (LUTS)   . Memory deficit   . PAF (paroxysmal atrial fibrillation) ([place]) cardilogist-- dr Jake Bathe   on-set 05/ 2020 during admission for [facility],  w/ RVR,  with cardizem/ toprol, back in NSR,  f/u by cardiologist , event monitor 09-16-2018 no results in epic by per pt wife was told result ok with no afib  . S/P drug eluting coronary stent placement    04-06-2014  @[facility]   DES x1 to LAD and POBA to D1  . SAH (subarachnoid hemorrhage) [facility]) neurologist-  dr Krista Blue--- last head CT 10-01-2018 resolving (10-23-2018  residual deficits memory issues, abnormal gait, congnitive   w/ SDH due to unwitnessed fall, traumatic head injury---- admission 07-14-2018 , d/c'd 08-01-2018,  readmitted 3 days later with late TBI effects,  d/c'd from rehab 08-22-2018  . Seizure disorder [facility])    followed by dr Krista Blue  . Subdural hematoma ([place])   . Type 2 diabetes mellitus ([place])  followed by pcp  . 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 CATARACT EXTRACTION W/ INTRAOCULAR LENS  IMPLANT, BILATERAL  2008; 2009  . CORONARY ANGIOPLASTY WITH STENT PLACEMENT  04-06-2014  @[facility]    DES x1 to LAD and POBA to D1  . HYDROCELE EXCISION Bilateral 10/27/2018   Procedure: HYDROCELECTOMY ADULT;  Surgeon: [name], MD;  Location: [facility];  Service: Urology;  Laterality: Bilateral;  . TONSILLECTOMY  child  . 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   Procedure: Right ventriculoperitoneal shunt placement;  Surgeon: Judith Part, MD;  Location: [place];  Service: Neurosurgery;  Laterality: Right;    FAMILY HISTORY: Family History  Problem Relation Age of Onset  . Stroke Father   . Hypertension Father   . Hyperlipidemia Father   . Early death Mother   . Colitis Sister   . Appendicitis Brother     SOCIAL HISTORY: Social History    Socioeconomic History  . Marital status: Married    Spouse name: Not on file  . Number of children: 2  . Years of education: college  . Highest education level: Master's degree (e.g., MA, MS, MEng, MEd, MSW, MBA)  Occupational History  . Occupation: Retired  Tobacco Use  . Smoking status: Never Smoker  . Smokeless tobacco: Never Used  Vaping Use  . Vaping Use: Never used  Substance and Sexual Activity  . Alcohol use: Not Currently    Comment: VERY RARELY  . Drug use: Never  . Sexual activity: Not on file  Other Topics Concern  . Not on file  Social History Narrative   Lives at home with his wife.   Right-handed.   Limited caffeine use.   Social Determinants of Health   Financial Resource Strain: Not on file  Food Insecurity: Not on file  Transportation Needs: Not on file  Physical Activity: Not on file  Stress: Not on file  Social Connections: Not on file  Intimate Partner Violence: Not on file   PHYSICAL EXAM  Vitals:   06/06/20 1252  BP: 126/69  Pulse: 76  Weight: 208 lb (94.3 kg)  Height: 5' 9"  (1.753 m)   Body mass index is 30.72 kg/m.  Generalized: Well developed, in no acute distress  Neurological examination  Mentation: Alert oriented to time, place, history taking. Follows all commands speech and language fluent Cranial nerve II-XII: Left pupil 5 mm sluggish, right pupil 3 mm brisk reaction to light,  extraocular movements were full, visual field were full on confrontational test. Facial sensation and strength were normal. Head turning and shoulder shrug  were normal and symmetric. Motor: The motor testing reveals 5 over 5 strength of all 4 extremities. Good symmetric motor tone is noted throughout.  Sensory: Sensory testing is intact to soft touch on all 4 extremities. No evidence of extinction is noted.  Coordination: Cerebellar testing reveals good finger-nose-finger and heel-to-shin bilaterally.  Gait and station: Gait is forward leaning, wide  based, cautious Reflexes: Deep tendon reflexes are symmetric and normal bilaterally.   DIAGNOSTIC DATA (LABS, IMAGING, TESTING) - I reviewed patient records, labs, notes, testing and imaging myself where available.  Lab Results  Component Value Date   WBC 4.3 08/18/2019   HGB 15.4 08/18/2019   HCT 46.4 08/18/2019   MCV 89 08/18/2019   PLT 165 08/18/2019      Component Value Date/Time   NA 138 08/18/2019 1455  K 4.1 08/18/2019 1455   CL 98 08/18/2019 1455   CO2 23 08/18/2019 1455   GLUCOSE 279 (H) 08/18/2019 1455   GLUCOSE 161 (H) 08/14/2019 0804   BUN 20 08/18/2019 1455   CREATININE 0.85 08/18/2019 1455   CALCIUM 9.2 08/18/2019 1455   PROT 6.2 08/18/2019 1455   ALBUMIN 3.9 08/18/2019 1455   AST 24 08/18/2019 1455   ALT 26 08/18/2019 1455   ALKPHOS 79 08/18/2019 1455   BILITOT 0.4 08/18/2019 1455   GFRNONAA 91 08/18/2019 1455   GFRAA 105 08/18/2019 1455   Lab Results  Component Value Date   CHOL 171 02/19/2019   HDL 34.50 (L) 02/19/2019   LDLCALC 111 (H) 02/19/2019   TRIG 128.0 02/19/2019   CHOLHDL 5 02/19/2019   Lab Results  Component Value Date   HGBA1C 10.5 (H) 08/12/2019   Lab Results  Component Value Date   VITAMINB12 264 10/09/2018   Lab Results  Component Value Date   TSH 1.820 08/12/2019   ASSESSMENT AND PLAN 70 y.o. year old male  has a past medical history of Abnormality of gait, Cognitive deficit as late effect of traumatic brain injury ([place]), Coronary artery disease, Dysrhythmia, History of non-ST elevation myocardial infarction (NSTEMI), Hydrocele, bilateral, Hyperlipemia, Hypertension, Lower urinary tract symptoms (LUTS), Memory deficit, PAF (paroxysmal atrial fibrillation) ([place]) (cardilogist-- dr Jake Bathe), S/P drug eluting coronary stent placement, SAH (subarachnoid hemorrhage) ([place]) (neurologist-  dr Krista Blue--- last head CT 10-01-2018 resolving (10-23-2018  residual deficits memory issues, abnormal gait, congnitive), Seizure disorder ([place]), Subdural  hematoma ([place]), Type 2 diabetes mellitus ([place]), and Urinary incontinence. here with:  1.  History of fall on Jul 14, 2018 with left convexity subdural hematoma, subarachnoid hemorrhage 2.  Normal pressure hydrocephalus, status post VP shunt placement December 02, 2018 3.  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) -EEG in October 2011 mildly abnormal, evidence of intermittent left-sided slowing and T3, T5 sharp transient indicating left hemisphere irritability -Currently not on any seizure medication, stopped on his own, apparently Depakote caused diarrhea and heartburn; Keppra resulted in fatigue/drowsiness; never started Lamictal -Fortunately, no known seizure activity, long discussion about the importance of taking seizure medication given seizure history/EEG findings -We will again restart Lamictal 25 mg titration, working up to 100 mg twice daily for seizure prevention -Would recommend refraining from driving at least until up on full dose Lamictal given history of seizure activity/EEG findings  4.  Left pupil abnormality, larger than the right, unclear baseline  -Will get records from [facility] eye [facility] at [facility]  -There is no report of vision change, left eye lid is droopy at baseline vs the right, there has been no trauma or any new symptoms -Reviewed case with Dr. Krista Blue, she would like to get EEG, re-evaluate the eye findings, and emphasized the importance of seizure medication follow-up with her in the next 3-4 weeks  I spent 30 minutes of face-to-face and non-face-to-face time with patient.  This included previsit chart review, lab review, study review, order entry, electronic health record documentation, patient education.  Butler Denmark, AGNP-C, DNP 06/06/2020, 2:20 PM [facility] [address] [place], [place] [address] [phone]  Addendum: Discussed the case with nurse practitioner Judson Roch, history of left convexity subdural hematoma,  subarachnoid hemorrhage, normal pressure hydrocephalus, status post VP shunt placement, partial status epilepticus, patient has not been compliant with his antiepileptic medications, emphasized importance of keep antiemetic medications, follow-up pending June 20, 2020

## 2020-06-06 NOTE — Telephone Encounter (Signed)
[name] Frank, can you get patient in to see Dr. Terrace Arabia in the next 3-4 weeks, see my note.  Candace, can you get office visit from [facility], exam, last was in December.

## 2020-06-13 ENCOUNTER — Other Ambulatory Visit: Payer: Medicare HMO

## 2020-06-20 ENCOUNTER — Ambulatory Visit: Payer: Self-pay | Admitting: Neurology

## 2020-07-06 ENCOUNTER — Other Ambulatory Visit: Payer: Self-pay

## 2020-07-06 ENCOUNTER — Ambulatory Visit: Payer: Medicare HMO

## 2020-07-06 DIAGNOSIS — G40211 Localization-related (focal) (partial) symptomatic epilepsy and epileptic syndromes with complex partial seizures, intractable, with status epilepticus: Secondary | ICD-10-CM

## 2020-07-08 ENCOUNTER — Emergency Department (INDEPENDENT_AMBULATORY_CARE_PROVIDER_SITE_OTHER)
Admission: EM | Admit: 2020-07-08 | Discharge: 2020-07-08 | Disposition: A | Discharge status: Home / Self Care | Payer: Medicare HMO | Source: Home / Self Care | Attending: Family Medicine | Admitting: Family Medicine

## 2020-07-08 ENCOUNTER — Encounter: Payer: Self-pay | Admitting: Family Medicine

## 2020-07-08 ENCOUNTER — Other Ambulatory Visit: Payer: Self-pay

## 2020-07-08 ENCOUNTER — Encounter: Payer: Self-pay | Admitting: Emergency Medicine

## 2020-07-08 DIAGNOSIS — S80211A Abrasion, right knee, initial encounter: Secondary | ICD-10-CM | POA: Diagnosis not present

## 2020-07-08 DIAGNOSIS — S60419A Abrasion of unspecified finger, initial encounter: Secondary | ICD-10-CM | POA: Diagnosis not present

## 2020-07-08 DIAGNOSIS — S60511A Abrasion of right hand, initial encounter: Secondary | ICD-10-CM

## 2020-07-08 DIAGNOSIS — S80212A Abrasion, left knee, initial encounter: Secondary | ICD-10-CM

## 2020-07-08 MED ORDER — MUPIROCIN 2 % EX OINT: 1.0000 "application " | TOPICAL_OINTMENT | Freq: Three times a day (TID) | CUTANEOUS | 0 refills | Status: DC

## 2020-07-08 MED ORDER — TETANUS-DIPHTH-ACELL PERTUSSIS 5-2.5-18.5 LF-MCG/0.5 IM SUSY: 0.5000 mL | PREFILLED_SYRINGE | Freq: Once | INTRAMUSCULAR | Status: DC

## 2020-07-08 NOTE — ED Provider Notes (Signed)
[facility]    CSN: [id number] Arrival date & time: 07/08/20  1746      History   Chief Complaint Chief Complaint  Patient presents with  . Fall    HPI [name] Frank. is a 70 y.o. male.   While walking to his car in the dark two nights ago, patient stumbled and fell as he stepped into a hidden depression in the pavement.  He struck his left wrist and left knee, resulting in a minor abrasion on his left knee.  Pain/swelling in left knee and left wrist have now resolved. Last night while pushing a trash cart up his driveway, he lost control of the cart and fell again.  This time he scraped his right knee and the dorsum of several right fingers. He reports that he has minimal right knee pain, but complains of persistent right finger pain.  He denies head or chest injury and no loss of consciousness.  He has a past history of sub-dural hematoma, hydrocephalus, and ventriculo-peritoneal shunt.  He denies neurologic symptoms.  He does not remember his last Tdap.  The history is provided by the patient and a relative.    Past Medical History:  Diagnosis Date  . Abnormality of gait    uses walker and wheelchair  . Cognitive deficit as late effect of traumatic brain injury (HCC)   . Coronary artery disease    cardiologist-- dr s. Dickie La---  04-06-2014  NSTEMI  s/p  cardiac cath DES x1 to LAD and POBA to D1  . Dysrhythmia   . History of non-ST elevation myocardial infarction (NSTEMI)    04-06-2013  s/p  coronary stent and PCI  . Hydrocele, bilateral   . Hyperlipemia   . Hypertension   . Lower urinary tract symptoms (LUTS)   . Memory deficit   . PAF (paroxysmal atrial fibrillation) (HCC) cardilogist-- dr Dickie La   on-set 05/ 2020 during admission for [facility],  w/ RVR,  with cardizem/ toprol, back in NSR,  f/u by cardiologist , event monitor 09-16-2018 no results in epic by per pt wife was told result ok with no afib  . S/P drug eluting coronary stent placement     04-06-2014  @[facility]   DES x1 to LAD and POBA to D1  . SAH (subarachnoid hemorrhage) [facility]) neurologist-  dr [facility]--- last head CT 10-01-2018 resolving (10-23-2018  residual deficits memory issues, abnormal gait, congnitive   w/ SDH due to unwitnessed fall, traumatic head injury---- admission 07-14-2018 , d/c'd 08-01-2018,  readmitted 3 days later with late TBI effects,  d/c'd from rehab 08-22-2018  . Seizure disorder [facility])    followed by dr [facility]  . Subdural hematoma (HCC)   . Type 2 diabetes mellitus (HCC)    followed by pcp  . Urinary incontinence    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    Patient Active Problem List   Diagnosis Date Noted  . Gait abnormality 11/23/2019  . Complex partial status epilepticus (HCC) 08/13/2019  . Uncontrolled type 2 diabetes mellitus (HCC) 08/13/2019  . Acute confusion 08/12/2019  . Unspecified abnormalities of gait and mobility 12/16/2018  . 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 Normal pressure hydrocephalus (HCC) 12/02/2018  . History of subdural hematoma 11/18/2018  . Urinary incontinence 11/18/2018  . Memory loss 10/09/2018  . Right hemiparesis (HCC) 09/04/2018  . Cognitive deficit as late effect of traumatic brain injury (HCC) 09/04/2018  . Bradycardia   . Leukopenia   . Hypoalbuminemia due to protein-calorie  malnutrition (HCC)   . Hydrocele   . Hydrocephalus (HCC) 07/28/2018  . Mild renal insufficiency 07/28/2018  . Paroxysmal atrial fibrillation (HCC) 07/28/2018  . Subdural hematoma (HCC) 07/14/2018  . Hypertension, essential 01/16/2018  . Controlled type 2 diabetes mellitus without complication, without long-term current use of insulin (HCC) 01/16/2018  . Coronary artery disease involving native heart without angina pectoris 01/16/2018    Past Surgical History:  Procedure Laterality Date  . CATARACT EXTRACTION W/ INTRAOCULAR LENS  IMPLANT, BILATERAL  2008; 2009  . CORONARY ANGIOPLASTY WITH STENT PLACEMENT  04-06-2014  @[facility]    DES  x1 to LAD and POBA to D1  . HYDROCELE EXCISION Bilateral 10/27/2018   Procedure: HYDROCELECTOMY ADULT;  Surgeon: 10/29/2018, MD;  Location: [facility];  Service: Urology;  Laterality: Bilateral;  . TONSILLECTOMY  child  . 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   Procedure: Right ventriculoperitoneal shunt placement;  Surgeon: 12/04/2018, MD;  Location: [facility] OR;  Service: Neurosurgery;  Laterality: Right;       Home Medications    Prior to Admission medications   Medication Sig Start Date End Date Taking? Authorizing Provider  aspirin 81 MG EC tablet Take 1 tablet by mouth daily. 04/19/20  Yes [provider]  atorvastatin (LIPITOR) 80 MG tablet Take 1 tablet (80 mg total) by mouth daily. Patient taking differently: Take 80 mg by mouth every evening. 02/19/19  Yes Copland, 14/10/20, MD  glipiZIDE (GLUCOTROL XL) 10 MG 24 hr tablet Take 1 tablet (10 mg total) by mouth 2 (two) times daily. 02/19/19  Yes Copland, 14/10/20, MD  lamoTRIgine (LAMICTAL) 100 MG tablet Take 1 tablet (100 mg total) by mouth 2 (two) times daily. 06/06/20  Yes 06/08/20, NP  lamoTRIgine (LAMICTAL) 25 MG tablet 1 tablet twice a day for the first week 2 tablets twice a day for the second week 3 tablets twice a day for the third week 4 tablets twice a day for the fourth week  For total of 140 tablets  After finish titration with small dose of lamotrigine 25 mg, change to lamotrigine 100 mg twice a day 06/06/20  Yes 06/08/20, NP  metFORMIN (GLUCOPHAGE) 1000 MG tablet Take 1 tablet (1,000 mg total) by mouth 2 (two) times daily with a meal. 08/24/19  Yes Copland, 08/26/19, MD  metoprolol succinate (TOPROL XL) 25 MG 24 hr tablet Take 1 tablet (25 mg total) by mouth daily. 08/17/19 08/16/20 Yes 10/16/20, MD  mupirocin ointment (BACTROBAN) 2 % Apply 1 application topically 3 (three) times daily. 07/08/20  Yes 07/10/20, MD    Family History Family History  Problem  Relation Age of Onset  . Stroke Father   . Hypertension Father   . Hyperlipidemia Father   . Early death Mother   . Colitis Sister   . Appendicitis Brother     Social History Social History   Tobacco Use  . Smoking status: Never Smoker  . Smokeless tobacco: Never Used  Vaping Use  . Vaping Use: Never used  Substance Use Topics  . Alcohol use: Not Currently    Comment: VERY RARELY  . Drug use: Never     Allergies   Patient has no known allergies.   Review of Systems Review of Systems  Constitutional: Negative for activity change, appetite change, chills, diaphoresis, fatigue and fever.  HENT: Negative.   Eyes: Negative.   Respiratory: Negative for cough, chest tightness and shortness of breath.  Cardiovascular: Negative for chest pain.  Gastrointestinal: Negative.   Musculoskeletal: Positive for arthralgias and joint swelling. Negative for back pain, gait problem, myalgias, neck pain and neck stiffness.  Skin: Positive for wound.  Neurological: Negative for dizziness, seizures, syncope, facial asymmetry, speech difficulty, weakness, light-headedness, numbness and headaches.  Psychiatric/Behavioral: Negative for confusion.     Physical Exam Triage Vital Signs ED Triage Vitals  Enc Vitals Group     BP 07/08/20 1820 128/76     Pulse Rate 07/08/20 1820 83     Resp --      Temp 07/08/20 1820 98.1 F (36.7 C)     Temp Source 07/08/20 1820 Oral     SpO2 07/08/20 1820 93 %     Weight --      Height --      Head Circumference --      Peak Flow --      Pain Score 07/08/20 1821 5     Pain Loc --      Pain Edu? --      Excl. in GC? --    No data found.  Updated Vital Signs BP 128/76 (BP Location: Left Arm)   Pulse 83   Temp 98.1 F (36.7 C) (Oral)   SpO2 93%   Visual Acuity Right Eye Distance:   Left Eye Distance:   Bilateral Distance:    Right Eye Near:   Left Eye Near:    Bilateral Near:     Physical Exam Vitals and nursing note reviewed.   Constitutional:      General: He is not in acute distress.    Appearance: He is not ill-appearing.  HENT:     Head: Atraumatic.     Right Ear: External ear normal.     Left Ear: External ear normal.     Mouth/Throat:     Pharynx: Oropharynx is clear.  Eyes:     Extraocular Movements: Extraocular movements intact.     Conjunctiva/sclera: Conjunctivae normal.     Pupils: Pupils are equal, round, and reactive to light.  Cardiovascular:     Rate and Rhythm: Normal rate and regular rhythm.     Heart sounds: Normal heart sounds.  Pulmonary:     Breath sounds: Normal breath sounds.  Chest:     Chest wall: No tenderness.  Abdominal:     Palpations: Abdomen is soft.     Tenderness: There is no abdominal tenderness.  Musculoskeletal:        General: Tenderness present. No swelling.     Right wrist: Normal.     Left wrist: Normal.     Right hand: Tenderness present. No bony tenderness. Decreased range of motion.     Left hand: Normal.       Hands:     Cervical back: Normal range of motion.     Right knee: No swelling, effusion, erythema or lacerations. Normal range of motion. No tenderness.     Left knee: No swelling, effusion, erythema or lacerations. Normal range of motion. No tenderness.       Legs:     Comments: Both knees have minimal abrasions anteriorly without evidenced cellulitis.  Both knees have full range of motion.  Right hand has superficial abrasions on dorsa of fingers  as noted on diagram.  No evidence of cellulitis   Skin:    General: Skin is warm and dry.     Findings: No rash.  Neurological:     General: No focal deficit  present.     Mental Status: He is alert and oriented to person, place, and time.      UC Treatments / Results  Labs (all labs ordered are listed, but only abnormal results are displayed) Labs Reviewed - No data to display  EKG   Radiology No results found.  Procedures Procedures (including critical care time)  Medications  Ordered in UC Medications  Tdap (BOOSTRIX) injection 0.5 mL (has no administration in time range)    Initial Impression / Assessment and Plan / UC Course  I have reviewed the triage vital signs and the nursing notes.  Pertinent labs & imaging results that were available during my care of the patient were reviewed by me and considered in my medical decision making (see chart for details).    Abrasions on knees and fingers cleansed and Bacitracin/bandages applied. Administered Tdap.  Begin empiric Bactroban ointment. Followup with Family Doctor if not improved in one week.     Final Clinical Impressions(s) / UC Diagnoses   Final diagnoses:  Abrasion of right hand and fingers, initial encounter  Abrasion of knee, bilateral     Discharge Instructions     Change dressing daily and apply mupirocin ointment to wounds.  Keep wounds clean and dry.  Return for any signs of infection (or follow-up with family doctor):  Increasing redness, swelling, pain, heat, drainage, etc.      ED Prescriptions    Medication Sig Dispense Auth. Provider   mupirocin ointment (BACTROBAN) 2 % Apply 1 application topically 3 (three) times daily. 22 each Lattie HawBeese, Myeshia Fojtik A, MD        Lattie HawBeese, Alley Neils A, MD 07/09/20 [phone]

## 2020-07-08 NOTE — Discharge Instructions (Signed)
Change dressing daily and apply mupirocin ointment to wounds.  Keep wounds clean and dry.  Return for any signs of infection (or follow-up with family doctor):  Increasing redness, swelling, pain, heat, drainage, etc.

## 2020-07-08 NOTE — ED Triage Notes (Signed)
Patient states that he fell 2 nights ago walking to his car.  Patient is having left wrist pain from his fall 2 nights ago.  Patient fell again last night pushing his trash can up the driveway.  Patient fell on his right side last night.  Patient has abrasions on his right hand/fingers and both knees.  Patient states that he did not lose consciousness.

## 2020-07-12 ENCOUNTER — Telehealth: Payer: Self-pay | Admitting: Neurology

## 2020-07-12 ENCOUNTER — Encounter: Payer: Self-pay | Admitting: Neurology

## 2020-07-12 ENCOUNTER — Ambulatory Visit: Payer: Medicare HMO | Admitting: Neurology

## 2020-07-12 VITALS — BP 132/64 | HR 64 | Ht 69.0 in | Wt 204.5 lb

## 2020-07-12 DIAGNOSIS — R269 Unspecified abnormalities of gait and mobility: Secondary | ICD-10-CM | POA: Diagnosis not present

## 2020-07-12 DIAGNOSIS — Z982 Presence of cerebrospinal fluid drainage device: Secondary | ICD-10-CM

## 2020-07-12 DIAGNOSIS — G40211 Localization-related (focal) (partial) symptomatic epilepsy and epileptic syndromes with complex partial seizures, intractable, with status epilepticus: Secondary | ICD-10-CM | POA: Diagnosis not present

## 2020-07-12 DIAGNOSIS — S065XAA Traumatic subdural hemorrhage with loss of consciousness status unknown, initial encounter: Secondary | ICD-10-CM

## 2020-07-12 DIAGNOSIS — S065X9A Traumatic subdural hemorrhage with loss of consciousness of unspecified duration, initial encounter: Secondary | ICD-10-CM

## 2020-07-12 MED ORDER — LAMOTRIGINE 100 MG PO TABS: 100.0000 mg | ORAL_TABLET | Freq: Two times a day (BID) | ORAL | 4 refills | Status: AC

## 2020-07-12 NOTE — Progress Notes (Addendum)
[facility] [place] [address], [address] [place], [place] [phone] [phone]  Date:  07/14/2020   Name:  [name] ShutterDonald G Salonga Jr.   DOB:  September 13, 1950   MRN:  [id number]  PCP:  Pearline Cablesopland, Shannelle Alguire C, MD    Chief Complaint: Follow-up   History of Present Illness:  [name] ShutterDonald G Vanlanen Jr. is a 70 y.o. very pleasant male patient who presents with the following:  Patient seen today for follow-up Last seen by myself June 2021 History of diabetes, hypertension, renal insufficiency He fell in May 2020, sustained a TBI and subdural hematoma.  Later found to have normal pressure hydrocephalus and had a VP shunt placed September 2020 with improvement  He was seen at urgent care on April 29 after a fall-he suffered scrapes to his hands and knees, was treated and released He has healing scrapes on his right hand and his left shin He notes that he actually fell twice recently  We will give him a tetanus booster as this appears to be due   Recent follow-up with neurology, Dr. Terrace ArabiaYan saw him earlier this week  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 (hospitalized 6/3-08/17/2019)             -EEG in October 2011 mildly abnormal, evidence of intermittent left-sided slowing and T3, T5 sharp transient indicating left hemisphere irritability             Patient stopped his antiepileptic medications in his visit in March 2022, complains side effect              Depakote caused diarrhea and heartburn; Keppra resulted in fatigue/drowsiness;             Was started on lamotrigine 100 mg twice a day, tolerating it well,             Repeat EEG on July 06, 2020 was normal Mild cognitive impairment             MoCA examination is 22/30,             Laboratory evaluation showed no treatable  etiology             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 Coronary artery disease involving native coronary artery of native heart without angina pectoris  2. Essential hypertension  3. PAF (paroxysmal atrial fibrillation) (HCC)  4. Mixed hyperlipidemia  5. Type 2 diabetes mellitus without complication, without long-term current use of insulin (HCC)  He has a few chest pains atypical for coronary disease. CAD, stable Dyslipidemia, off therapy HTN, off therapy, but appears to be controlled DM, followed by PCP We had a long discussion about his nonadherence to his medical regimen. He says he had various side effects was not sure what medicine it was but is stopped all of his medications. I stressed to him this is likely to cause him to have problems with his heart and sugar. After some discussion we decided to restart low-dose aspirin and low-dose statin medicines at the very least. If he tolerates those perhaps we can reintroduce a beta-blocker and  a future visit. I also told him he needs to get up with his regular medical doctor about his sugar and find medicines he can take for that. I congratulated him on his weight loss and encouraged him to continue to do so. Increased exercise recommended  Lab Results  Component Value Date   HGBA1C 10.5 (H) 08/12/2019   Eye exam- pt reports this is UTD.   Colon [place] needed [place] follow-up due to poor prep - should be done next year  Tetanus vaccine- will update for him today  Need to update A1c and other routine labs  He notes that he is tolerating lamictal well He stopped taking his BP meds, cholesterol meds and DM meds due to "one of they causing me diarrhea"- I would presume this was metformin  He is taking asa    Patient Active Problem List   Diagnosis Date Noted  . Partial symptomatic epilepsy with complex partial seizures, intractable, with status epilepticus (HCC) 07/12/2020  . Gait abnormality 11/23/2019  . Complex partial status epilepticus (HCC) 08/13/2019  . Uncontrolled type 2 diabetes mellitus (HCC) 08/13/2019  . Acute confusion 08/12/2019  . Unspecified abnormalities of gait and mobility 12/16/2018  . 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 Normal pressure hydrocephalus (HCC) 12/02/2018  . History of subdural hematoma 11/18/2018  . Urinary incontinence 11/18/2018  . Memory loss 10/09/2018  . Right hemiparesis (HCC) 09/04/2018  . Cognitive deficit as late effect of traumatic brain injury (HCC) 09/04/2018  . Bradycardia   . Leukopenia   . Hypoalbuminemia due to protein-calorie malnutrition (HCC)   . Hydrocele   . Hydrocephalus (HCC) 07/28/2018  . Mild renal insufficiency 07/28/2018  . Paroxysmal atrial fibrillation (HCC) 07/28/2018  . Subdural hematoma (HCC) 07/14/2018  . Hypertension, essential 01/16/2018  . Coronary artery disease involving native heart without angina pectoris 01/16/2018    Past Medical History:  Diagnosis Date  . Abnormality of gait    uses walker and wheelchair  . Cognitive deficit as late effect of traumatic brain injury (HCC)   . Coronary artery disease    cardiologist-- dr s. Dickie La---  04-06-2014  NSTEMI  s/p  cardiac cath DES x1 to LAD and POBA to D1  . Dysrhythmia   . History of non-ST elevation myocardial infarction (NSTEMI)    04-06-2013  s/p  coronary stent and PCI  . Hydrocele, bilateral   . Hyperlipemia   . Hypertension   . Lower urinary tract symptoms (LUTS)   . Memory deficit   . PAF (paroxysmal atrial fibrillation) (HCC) cardilogist-- dr Dickie La   on-set 05/ 2020 during admission for [facility],  w/ RVR,  with cardizem/ toprol, back in [facility],  f/u by cardiologist , event monitor 09-16-2018 no results in epic by per pt  wife was told result ok with no afib  . S/P drug eluting coronary stent placement    04-06-2014  @[facility]   DES x1 to LAD and POBA to D1  . SAH (subarachnoid hemorrhage) [facility]) neurologist-  dr [facility]--- last head CT 10-01-2018 resolving (10-23-2018  residual deficits memory issues, abnormal gait, congnitive   w/ SDH due to unwitnessed fall, traumatic head injury---- admission 07-14-2018 , d/c'd 08-01-2018,  readmitted 3 days later with late TBI effects,  d/c'd from rehab 08-22-2018  . Seizure disorder [facility])    followed by dr [facility]  . Subdural hematoma (HCC)   . Type 2 diabetes mellitus (HCC)    followed by  pcp  . 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 CATARACT EXTRACTION W/ INTRAOCULAR LENS  IMPLANT, BILATERAL  2008; 2009  . CORONARY ANGIOPLASTY WITH STENT PLACEMENT  04-06-2014  @[facility]    DES x1 to LAD and POBA to D1  . HYDROCELE EXCISION Bilateral 10/27/2018   Procedure: HYDROCELECTOMY ADULT;  Surgeon: 10/29/2018, MD;  Location: [facility];  Service: Urology;  Laterality: Bilateral;  . TONSILLECTOMY  child  . 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   Procedure: Right ventriculoperitoneal shunt placement;  Surgeon: 12/04/2018, MD;  Location: [facility] OR;  Service: Neurosurgery;  Laterality: Right;    Social History   Tobacco Use  . Smoking status: Never Smoker  . Smokeless tobacco: Never Used  Vaping Use  . Vaping Use: Never used  Substance Use Topics  . Alcohol use: Not Currently    Comment: VERY RARELY  . Drug use: Never    Family History  Problem Relation Age of Onset  . Stroke Father   . Hypertension Father   . Hyperlipidemia Father   . Early death Mother   . Colitis Sister   . Appendicitis Brother     No Known Allergies  Medication list has been reviewed and updated.  Current Outpatient Medications on File Prior to Visit  Medication Sig Dispense Refill  . aspirin 81 MG EC tablet  Take 1 tablet by mouth daily.    [facility] atorvastatin (LIPITOR) 80 MG tablet Take 1 tablet (80 mg total) by mouth daily. (Patient taking differently: Take 80 mg by mouth every evening.) 90 tablet 3  . glipiZIDE (GLUCOTROL XL) 10 MG 24 hr tablet Take 1 tablet (10 mg total) by mouth 2 (two) times daily. 180 tablet 3  . lamoTRIgine (LAMICTAL) 100 MG tablet Take 1 tablet (100 mg total) by mouth 2 (two) times daily. 180 tablet 4  . metoprolol succinate (TOPROL XL) 25 MG 24 hr tablet Take 1 tablet (25 mg total) by mouth daily. 30 tablet 1  . mupirocin ointment (BACTROBAN) 2 % Apply 1 application topically 3 (three) times daily. 22 each 0   No current facility-administered medications on file prior to visit.    Review of Systems:  As per HPI- otherwise negative.   Physical Examination: Vitals:   07/14/20 1521  BP: 135/81  Pulse: 61  Temp: (!) 97.2 F (36.2 C)  SpO2: 98%   Vitals:   07/14/20 1521  Weight: 201 lb 12.8 oz (91.5 kg)  Height: 5\' 9"  (1.753 m)   Body mass index is 29.8 kg/m. Ideal Body Weight: Weight in (lb) to have BMI = 25: 168.9  GEN: no acute distress.  Mild overweight, looks well and his normal self  HEENT: Atraumatic, Normocephalic.  Ears and Nose: No external deformity. CV: RRR, No M/G/R. No JVD. No thrill. No extra heart sounds. PULM: CTA B, no wheezes, crackles, rhonchi. No retractions. No resp. distress. No accessory muscle use. ABD: S, NT, ND EXTR: No c/c/e PSYCH: Normally interactive. Conversant.  Foot exam: normal  Uses a cane  He has scrapes on the dorsum of right hand fingers   Assessment and Plan: Coronary artery disease involving native heart without angina pectoris, unspecified vessel or lesion type - Plan: Hemoglobin A1c, Comprehensive metabolic panel, Lipid panel  Hypertension, essential - Plan: CBC, Comprehensive metabolic panel  Controlled type 2 diabetes mellitus without complication, without long-term current use of insulin (HCC) - Plan:  Comprehensive  metabolic panel  Subdural hematoma (HCC)  Paroxysmal atrial fibrillation (HCC) - Plan: TSH  Physical debility  Screening for prostate cancer - Plan: PSA  Immunization due - Plan: Td vaccine greater than or equal to 7yo preservative free IM  Following up today Labs pending as above Suspect A1c will be high and we will need to start him on something for blood sugar, but he does not tolerate metformin Updated tetanus Encouraged him to go back on his statin Will plan further follow- up pending labs.  Pt prefers a phone call with labs  This visit occurred during the SARS-CoV-2 public health emergency.  Safety protocols were in place, including screening questions prior to the visit, additional usage of staff PPE, and extensive cleaning of exam room while observing appropriate contact time as indicated for disinfecting solutions.    Signed Abbe Amsterdam, MD  Received labs 5/7- called pt Lipids and glucose need improvement Will start on invokana 100, and go back on glipizide xl He is taking lipitor again and will continue this Asked him to see me in 3 months to check on progress    Results for orders placed or performed in visit on 07/14/20  Hemoglobin A1c  Result Value Ref Range   Hgb A1c MFr Bld 11.3 (H) 4.6 - 6.5 %  CBC  Result Value Ref Range   WBC 4.7 4.0 - 10.5 K/uL   RBC 5.34 4.22 - 5.81 Mil/uL   Platelets 173.0 150.0 - 400.0 K/uL   Hemoglobin 15.7 13.0 - 17.0 g/dL   HCT 44.0 10.2 - 72.5 %   MCV 85.9 78.0 - 100.0 fl   MCHC 34.2 30.0 - 36.0 g/dL   RDW 36.6 44.0 - 34.7 %  Comprehensive metabolic panel  Result Value Ref Range   Sodium 135 135 - 145 mEq/L   Potassium 4.7 3.5 - 5.1 mEq/L   Chloride 101 96 - 112 mEq/L   CO2 25 19 - 32 mEq/L   Glucose, Bld 271 (H) 70 - 99 mg/dL   BUN 20 6 - 23 mg/dL   Creatinine, Ser 4.25 0.40 - 1.50 mg/dL   Total Bilirubin 0.6 0.2 - 1.2 mg/dL   Alkaline Phosphatase 70 39 - 117 U/L   AST 10 0 - 37 U/L   ALT 13 0 -  53 U/L   Total Protein 7.0 6.0 - 8.3 g/dL   Albumin 4.5 3.5 - 5.2 g/dL   GFR 95.63 >87.56 mL/min   Calcium 9.6 8.4 - 10.5 mg/dL  Lipid panel  Result Value Ref Range   Cholesterol 217 (H) 0 - 200 mg/dL   Triglycerides 433.2 (H) 0.0 - 149.0 mg/dL   HDL 95.18 (L) >84.16 mg/dL   VLDL 60.6 0.0 - 30.1 mg/dL   LDL Cholesterol 601 (H) 0 - 99 mg/dL   Total CHOL/HDL Ratio 7    NonHDL 183.32   PSA  Result Value Ref Range   PSA 0.29 0.10 - 4.00 ng/mL  TSH  Result Value Ref Range   TSH 1.48 0.35 - 4.50 uIU/mL   Lab Results  Component Value Date   PSA 0.29 07/14/2020   PSA 0.30 01/16/2018

## 2020-07-12 NOTE — Telephone Encounter (Signed)
Pt stated he would like to see Maralyn Sago in July instead of coming back in November (6 months).

## 2020-07-12 NOTE — Patient Instructions (Addendum)
It was great to see you again today!   I would recommend getting the shingles vaccine at your pharmacy, and also a fourth dose of COVID-19 if not already  We will be in touch with your labs asap Please start back on lipitor for your cholesterol, and continue aspirin  We will find a solution for your blood sugars I would suspect it is metformin that was causing your diarrhea and other stomach symptoms Tetanus booster today

## 2020-07-12 NOTE — Procedures (Signed)
   HISTORY: 70 year old male, with history of subdural hematoma, partial status epilepticus,  TECHNIQUE:  This is a routine 16 channel EEG recording with one channel devoted to a limited EKG recording.  It was performed during wakefulness, drowsiness and asleep.  Hyperventilation and photic stimulation were performed as activating procedures.  There are minimum muscle and movement artifact noted.  Upon maximum arousal, posterior dominant waking rhythm consistent of rhythmic alpha range activity, with frequency of 8 hz. Activities are symmetric over the bilateral posterior derivations and attenuated with eye opening.  Hyperventilation produced mild/moderate buildup with higher amplitude and the slower activities noted.  Photic stimulation did not alter the tracing.  During EEG recording, patient developed drowsiness and no deeper stage of sleep was achieved  During EEG recording, there was no epileptiform discharge noted.  EKG demonstrate sinus rhythm, with heart rate of 56 bpm  CONCLUSION: This is a  normal awake EEG.  There is no electrodiagnostic evidence of epileptiform discharge.  Levert Feinstein, M.D. Ph.D.  [facility] [address] [place], [place] [address] Phone: [phone] Fax:      [phone]

## 2020-07-12 NOTE — Progress Notes (Signed)
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 (hospitalized 6/3-08/17/2019)  -EEG in October 2011 mildly abnormal, evidence of intermittent left-sided slowing and T3, T5 sharp transient indicating left hemisphere irritability  Patient stopped his antiepileptic medications in his visit in March 2022, complains side effect   Depakote caused diarrhea and heartburn; Keppra resulted in fatigue/drowsiness;  Was started on lamotrigine 100 mg twice a day, tolerating it well,  Repeat EEG on July 06, 2020 was normal Mild cognitive impairment  MoCA examination is 22/30,  Laboratory evaluation showed no treatable etiology  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 Lamar Blinks for evaluation of possible seizure, history of subdural hematoma, initial evaluation was on October 09, 2018.  I have reviewed and summarized the referring note from the referring physician. He had a past medical history of hypertension, type 2 diabetes, coronary artery disease, paroxysmal atrial fibrillation, he had an unwitnessed fall at the bottom of the stairs, was brought to the emergency room on Jul 14, 2018, I personally reviewed CT head, acute subdural hematoma along the left cerebral convexity measuring up to 11 mm in thickness, patchy subarachnoid hemorrhage with a traumatic pattern, negative for cervical spine fracture. 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 He denies gait abnormality, memory loss at that time.  He readmitted to the hospital on Jul 28, 2018 for more somnolent, slept all day on May 16 sudden onset garbled speech, right facial droop, transient confusion, emergency room blood pressure 90/53, CT head showed developing community hydrocephalus, likely due to recent subarachnoid and intraventricular hemorrhage, no surgical intervention was indicated per neurosurgery evaluation, suspicious for complex partial seizure, he was discharged with Keppra 500 mg twice a day  Since recent hospital admission, there was no clinical seizure, but he was noted significant memory loss, he states, repeating questions, slept a lot, lost control of his urine, also has mild gait abnormality,  He is always a slow walker, he tends to wait on him, by reviewing the initial CT scan on Jul 14, 2018, there is already some evidence of hydrocephalus, enlarged bilateral lateral ventricle,  I personally reviewed MRI of the brain on Jul 29, 2018, persistent ventriculomegaly, suspected developing communicating hydrocephalus, no evidence of acute stroke, 10 mm left subdural hematoma, with no mass-effect on the hemisphere,  Laboratory evaluations showed normal ESR, C-reactive protein, ANA, HIV, RPR, B12, A1c was 7.2, hemoglobin was 12.8,   Update November 19, 2018: He continues to have significant memory loss, gait abnormality, worsening urinary incontinence, this is all new since May 2020, he retired as an Engineer, 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[employer], later worked as a Microbiologist, just finished a complicated project in April 2020, now he has difficulty operating coffee machine, difficulty initiating his gait, fell few times,  We have personally reviewed MRI CT scans, compared with multiple scans he  had a since May 2020,  most recent MRI brain November 15, 2018: Resolution of left subdural hematoma, ventriculomegaly out of proportion to the extent of mild generalized cortical atrophy, additionally there appears to be mild transependymal flow, 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 Zada Finders, will see him on September 9  He is tolerating Keppra 500 mg twice daily well, there was no recurrent seizure  He has tried Sinemet 25/100 mg 3 times daily, there was no significant improvement noted  UPDATE Oct 6th 2020: I reviewed operation record by Dr. Zada Finders, he had shunt replacement December 02, 2018.  He was seen by physical therapy on December 03, 2018, the day postsurgical, patient with significant posterior lean, poor trunk control, required bilateral upper extremity support, needs assistance, he reported overall 60% improvement in his walking, urinary frequency and urgency has much improved, still has mild memory loss  I reviewed CT head December 03, 2018: Operative changes from interval right bur hole craniotomy for VP shunt placement,  UPDATE Mar 17 2019: I personally reviewed CT head on Mar 01 2020:Slight further decrease in lateral ventricle size since October.Stable right superior frontal approach shunt. No adverse features. He is overall doing well, wife reported that he is back to his baseline, he is sedentary, is no longer taking Keppra 500 mg twice daily, we went over the initial event in May 2020, when he had unwitnessed fall at the ground level, but with resulting subdural hematoma, he had no recollection of the fall, possibility including seizure, after discussion, we decided to proceed with Keppra 500 mg twice a day  Lab in Dec 2020: LDL 111, CMP, glucose 299, creat 0.78, A1C  9.5  UPDATE Sept 13 2021: Patient presented to emergency room on August 11, 2019 for aphasia, word finding difficulties, left facial droop,  Personally reviewed MRI of the brain on August 12, 2019, no acute abnormality, right frontal VP shunt, stable size,  CT angiogram of head and neck showed no significant large vessel disease, intracranial atherosclerotic disease,  Following her hospital discharge she continued to have intermittent slurred speech, right facial droop, mixed with lucid time, he also increased confusion to the point of urinary incontinence, stat EEG showed left hemispheric rhythmic slowing, consistent with his partial status epilepticus,  He was loaded with Depacon IV, and was sent to emergency room, placed on Depakote DR 750 mg 3 times a day, later switched to ER 1000 mg twice a day, along with Keppra 1000 mg twice daily Keppra level 11.9, TSH 1.820, A1C 10.5.   EEG by Dr. Hortense Ramal on August 14, 2019 showed left frontotemporal region rhythmic delta activity, a total of interictal continuum, indicating potential epileptogenicity.  Repeat EEG on August 15, 2019 is much improved with higher dose of anti-epileptic medications, also correlate with clinical improvement, I also reviewed cardiology visit on August 28, 2019 by Dr. Kathreen Devoid Rohrbeck, coronary artery disease involving native coronary artery without angina pectoris, hypertension, paroxysmal atrial fibrillation, uncontrolled diabetes with microalbuminemia, mixed hyperlipidemia,  Dr. Marijo File think patient is doing quite well from cardiac standpoint, he has been off aspirin since summer of 2020, now he has gone back on aspirin 81 mg daily,  UPDATE Jul 12 2020: He drove  himself to clinic today, tolerating lamotrigine 100 mg twice a day well, there was no recurrent seizure,  continue has mild gait abnormality, tripped and fell couple times over the past week, with bruising in his right fingers, left knee    REVIEW OF SYSTEMS:  Out of a complete 14 system review of symptoms, the patient complains only of the following symptoms, and all other reviewed systems are negative.  Seizures  ALLERGIES: No Known Allergies  HOME MEDICATIONS: Outpatient Medications Prior to Visit  Medication Sig Dispense Refill  . aspirin 81 MG EC tablet Take 1 tablet by mouth daily.    Marland Kitchen atorvastatin (LIPITOR) 80 MG tablet Take 1 tablet (80 mg total) by mouth daily. (Patient taking differently: Take 80 mg by mouth every evening.) 90 tablet 3  . glipiZIDE (GLUCOTROL XL) 10 MG 24 hr tablet Take 1 tablet (10 mg total) by mouth 2 (two) times daily. 180 tablet 3  . lamoTRIgine (LAMICTAL) 100 MG tablet Take 1 tablet (100 mg total) by mouth 2 (two) times daily. 60 tablet 11  . metFORMIN (GLUCOPHAGE) 1000 MG tablet Take 1 tablet (1,000 mg total) by mouth 2 (two) times daily with a meal. 180 tablet 3  . metoprolol succinate (TOPROL XL) 25 MG 24 hr tablet Take 1 tablet (25 mg total) by mouth daily. 30 tablet 1  . mupirocin ointment (BACTROBAN) 2 % Apply 1 application topically 3 (three) times daily. 22 each 0  . lamoTRIgine (LAMICTAL) 25 MG tablet 1 tablet twice a day for the first week 2 tablets twice a day for the second week 3 tablets twice a day for the third week 4 tablets twice a day for the fourth week  For total of 140 tablets  After finish titration with small dose of lamotrigine 25 mg, change to lamotrigine 100 mg twice a day 140 tablet 0   No facility-administered medications prior to visit.    PAST MEDICAL HISTORY: Past Medical History:  Diagnosis Date  . Abnormality of gait    uses walker and wheelchair  . Cognitive deficit as late effect of traumatic brain injury ([place])   . Coronary artery disease    cardiologist-- dr s. Jake Bathe---  04-06-2014  NSTEMI  s/p  cardiac cath DES x1 to LAD and POBA to D1  . Dysrhythmia   . History of non-ST elevation myocardial infarction (NSTEMI)    04-06-2013  s/p  coronary stent and PCI  .  Hydrocele, bilateral   . Hyperlipemia   . Hypertension   . Lower urinary tract symptoms (LUTS)   . Memory deficit   . PAF (paroxysmal atrial fibrillation) ([place]) cardilogist-- dr Jake Bathe   on-set 05/ 2020 during admission for [facility],  w/ RVR,  with cardizem/ toprol, back in NSR,  f/u by cardiologist , event monitor 09-16-2018 no results in epic by per pt wife was told result ok with no afib  . S/P drug eluting coronary stent placement    04-06-2014  _0   DES x1 to LAD and POBA to D1  . SAH (subarachnoid hemorrhage) [facility]) neurologist-  dr Krista Blue--- last head CT 10-01-2018 resolving (10-23-2018  residual deficits memory issues, abnormal gait, congnitive   w/ SDH due to unwitnessed fall, traumatic head injury---- admission 07-14-2018 , d/c'd 08-01-2018,  readmitted 3 days later with late TBI effects,  d/c'd from rehab 08-22-2018  . Seizure disorder [facility])    followed by dr Krista Blue  . Subdural hematoma ([place])   . Type 2 diabetes mellitus ([place])    followed by pcp  . 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 CATARACT EXTRACTION W/ INTRAOCULAR LENS  IMPLANT, BILATERAL  2008; 2009  . CORONARY ANGIOPLASTY WITH STENT PLACEMENT  04-06-2014  _0    DES x1 to LAD and POBA to D1  . HYDROCELE EXCISION Bilateral 10/27/2018   Procedure: HYDROCELECTOMY ADULT;  Surgeon: [name], MD;  Location: [facility];  Service: Urology;  Laterality: Bilateral;  . TONSILLECTOMY  child  . 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   Procedure: Right ventriculoperitoneal shunt placement;  Surgeon: Judith Part, MD;  Location: [place];  Service: Neurosurgery;  Laterality: Right;    FAMILY HISTORY: Family History  Problem Relation Age of Onset  . Stroke Father   . Hypertension Father   . Hyperlipidemia Father   . Early death Mother   . Colitis Sister   . Appendicitis Brother     SOCIAL HISTORY: Social History    Socioeconomic History  . Marital status: Married    Spouse name: Not on file  . Number of children: 2  . Years of education: college  . Highest education level: Master's degree (e.g., MA, MS, MEng, MEd, MSW, MBA)  Occupational History  . Occupation: Retired  Tobacco Use  . Smoking status: Never Smoker  . Smokeless tobacco: Never Used  Vaping Use  . Vaping Use: Never used  Substance and Sexual Activity  . Alcohol use: Not Currently    Comment: VERY RARELY  . Drug use: Never  . Sexual activity: Not on file  Other Topics Concern  . Not on file  Social History Narrative   Lives at home with his wife.   Right-handed.   Limited caffeine use.   Social Determinants of Health   Financial Resource Strain: Not on file  Food Insecurity: Not on file  Transportation Needs: Not on file  Physical Activity: Not on file  Stress: Not on file  Social Connections: Not on file  Intimate Partner Violence: Not on file   PHYSICAL EXAM  Vitals:   07/12/20 1332  BP: 132/64  Pulse: 64  Weight: 204 lb 8 oz (92.8 kg)  Height: _1  (1.753 m)   Body mass index is 30.2 kg/m.  Generalized: Well developed, in no acute distress  Montreal Cognitive Assessment  07/12/2020  Visuospatial/ Executive (0/5) 5  Naming (0/3) 3  Attention: Read list of digits (0/2) 2  Attention: Read list of letters (0/1) 1  Attention: Serial 7 subtraction starting at 100 (0/3) 1  Language: Repeat phrase (0/2) 2  Language : Fluency (0/1) 1  Abstraction (0/2) 2  Delayed Recall (0/5) 0  Orientation (0/6) 5  Total 22   Neurological examination  Mentation: Alert oriented to time, place, history taking. Follows all commands speech and language fluent Cranial nerve II-XII: Left pupil 5 mm sluggish, right pupil 3 mm brisk reaction to light,  extraocular movements were full, visual field were full on confrontational test. Facial sensation and strength were normal. Head turning and shoulder shrug  were normal and  symmetric. Motor: The motor testing reveals 5 over 5 strength of all 4 extremities. Good symmetric motor tone is noted throughout.  Sensory: Sensory testing is intact to soft touch on all 4 extremities. No evidence of extinction is noted.  Coordination: Cerebellar testing reveals good finger-nose-finger and heel-to-shin bilaterally.  Gait and station: He needs push-up to get up from seated position, wide-based, cautious Reflexes: Deep tendon reflexes are symmetric and normal bilaterally.   DIAGNOSTIC DATA (LABS, IMAGING, TESTING) -  I reviewed patient records, labs, notes, testing and imaging myself where available.  Lab Results  Component Value Date   WBC 4.3 08/18/2019   HGB 15.4 08/18/2019   HCT 46.4 08/18/2019   MCV 89 08/18/2019   PLT 165 08/18/2019      Component Value Date/Time   NA 138 08/18/2019 1455   K 4.1 08/18/2019 1455   CL 98 08/18/2019 1455   CO2 23 08/18/2019 1455   GLUCOSE 279 (H) 08/18/2019 1455   GLUCOSE 161 (H) 08/14/2019 0804   BUN 20 08/18/2019 1455   CREATININE 0.85 08/18/2019 1455   CALCIUM 9.2 08/18/2019 1455   PROT 6.2 08/18/2019 1455   ALBUMIN 3.9 08/18/2019 1455   AST 24 08/18/2019 1455   ALT 26 08/18/2019 1455   ALKPHOS 79 08/18/2019 1455   BILITOT 0.4 08/18/2019 1455   GFRNONAA 91 08/18/2019 1455   GFRAA 105 08/18/2019 1455   Lab Results  Component Value Date   CHOL 171 02/19/2019   HDL 34.50 (L) 02/19/2019   LDLCALC 111 (H) 02/19/2019   TRIG 128.0 02/19/2019   CHOLHDL 5 02/19/2019   Lab Results  Component Value Date   HGBA1C 10.5 (H) 08/12/2019   Lab Results  Component Value Date   VITAMINB12 264 10/09/2018   Lab Results  Component Value Date   TSH 1.820 08/12/2019

## 2020-07-14 ENCOUNTER — Other Ambulatory Visit: Payer: Self-pay

## 2020-07-14 ENCOUNTER — Encounter: Payer: Self-pay | Admitting: Family Medicine

## 2020-07-14 ENCOUNTER — Ambulatory Visit (INDEPENDENT_AMBULATORY_CARE_PROVIDER_SITE_OTHER): Payer: Medicare HMO | Admitting: Family Medicine

## 2020-07-14 VITALS — BP 135/81 | HR 61 | Temp 97.2°F | Ht 69.0 in | Wt 201.8 lb

## 2020-07-14 DIAGNOSIS — Z125 Encounter for screening for malignant neoplasm of prostate: Secondary | ICD-10-CM

## 2020-07-14 DIAGNOSIS — S065X9A Traumatic subdural hemorrhage with loss of consciousness of unspecified duration, initial encounter: Secondary | ICD-10-CM | POA: Diagnosis not present

## 2020-07-14 DIAGNOSIS — Z23 Encounter for immunization: Secondary | ICD-10-CM | POA: Diagnosis not present

## 2020-07-14 DIAGNOSIS — I10 Essential (primary) hypertension: Secondary | ICD-10-CM

## 2020-07-14 DIAGNOSIS — R5381 Other malaise: Secondary | ICD-10-CM

## 2020-07-14 DIAGNOSIS — I480 Paroxysmal atrial fibrillation: Secondary | ICD-10-CM

## 2020-07-14 DIAGNOSIS — S065XAA Traumatic subdural hemorrhage with loss of consciousness status unknown, initial encounter: Secondary | ICD-10-CM

## 2020-07-14 DIAGNOSIS — I2510 Atherosclerotic heart disease of native coronary artery without angina pectoris: Secondary | ICD-10-CM | POA: Diagnosis not present

## 2020-07-14 DIAGNOSIS — E119 Type 2 diabetes mellitus without complications: Secondary | ICD-10-CM

## 2020-07-15 LAB — HEMOGLOBIN A1C: Hgb A1c MFr Bld: 11.3 % — ABNORMAL HIGH (ref 4.6–6.5)

## 2020-07-15 LAB — LIPID PANEL
Cholesterol: 217 mg/dL — ABNORMAL HIGH (ref 0–200)
HDL: 33.2 mg/dL — ABNORMAL LOW (ref >39.00)
LDL Cholesterol: 148 mg/dL — ABNORMAL HIGH (ref 0–99)
NonHDL: 183.32
Total CHOL/HDL Ratio: 7
Triglycerides: 175 mg/dL — ABNORMAL HIGH (ref 0.0–149.0)
VLDL: 35 mg/dL (ref 0.0–40.0)

## 2020-07-15 LAB — CBC
HCT: 45.9 % (ref 39.0–52.0)
Hemoglobin: 15.7 g/dL (ref 13.0–17.0)
MCHC: 34.2 g/dL (ref 30.0–36.0)
MCV: 85.9 fl (ref 78.0–100.0)
Platelets: 173 10*3/uL (ref 150.0–400.0)
RBC: 5.34 Mil/uL (ref 4.22–5.81)
RDW: 13.7 % (ref 11.5–15.5)
WBC: 4.7 10*3/uL (ref 4.0–10.5)

## 2020-07-15 LAB — COMPREHENSIVE METABOLIC PANEL
ALT: 13 U/L (ref 0–53)
AST: 10 U/L (ref 0–37)
Albumin: 4.5 g/dL (ref 3.5–5.2)
Alkaline Phosphatase: 70 U/L (ref 39–117)
BUN: 20 mg/dL (ref 6–23)
CO2: 25 mEq/L (ref 19–32)
Calcium: 9.6 mg/dL (ref 8.4–10.5)
Chloride: 101 mEq/L (ref 96–112)
Creatinine, Ser: 0.91 mg/dL (ref 0.40–1.50)
GFR: 86.98 mL/min (ref >60.00)
Glucose, Bld: 271 mg/dL — ABNORMAL HIGH (ref 70–99)
Potassium: 4.7 mEq/L (ref 3.5–5.1)
Sodium: 135 mEq/L (ref 135–145)
Total Bilirubin: 0.6 mg/dL (ref 0.2–1.2)
Total Protein: 7 g/dL (ref 6.0–8.3)

## 2020-07-15 LAB — TSH: TSH: 1.48 u[IU]/mL (ref 0.35–4.50)

## 2020-07-15 LAB — PSA: PSA: 0.29 ng/mL (ref 0.10–4.00)

## 2020-07-16 MED ORDER — CANAGLIFLOZIN 100 MG PO TABS: 100.0000 mg | ORAL_TABLET | Freq: Every day | ORAL | 6 refills | Status: AC

## 2020-07-16 MED ORDER — GLIPIZIDE ER 5 MG PO TB24: 5.0000 mg | ORAL_TABLET | Freq: Every day | ORAL | 3 refills | Status: AC

## 2020-07-16 NOTE — Addendum Note (Signed)
Addended by: Abbe Amsterdam C on: 07/16/2020 11:59 AM   Modules accepted: Orders

## 2020-07-18 ENCOUNTER — Ambulatory Visit: Payer: Self-pay | Admitting: Neurology

## 2020-09-19 ENCOUNTER — Other Ambulatory Visit: Payer: Self-pay

## 2020-09-19 ENCOUNTER — Encounter: Payer: Self-pay | Admitting: Neurology

## 2020-09-19 ENCOUNTER — Ambulatory Visit: Payer: Medicare HMO | Admitting: Neurology

## 2020-09-19 VITALS — BP 127/72 | HR 56 | Ht 69.0 in | Wt 207.0 lb

## 2020-09-19 DIAGNOSIS — G912 (Idiopathic) normal pressure hydrocephalus: Secondary | ICD-10-CM | POA: Diagnosis not present

## 2020-09-19 DIAGNOSIS — G40909 Epilepsy, unspecified, not intractable, without status epilepticus: Secondary | ICD-10-CM | POA: Diagnosis not present

## 2020-09-19 DIAGNOSIS — G40211 Localization-related (focal) (partial) symptomatic epilepsy and epileptic syndromes with complex partial seizures, intractable, with status epilepticus: Secondary | ICD-10-CM

## 2020-09-19 NOTE — Progress Notes (Signed)
PATIENT: [name] Frank. DOB: 1950-05-01  REASON FOR VISIT: follow up HISTORY FROM: patient Primary Neurologist: Dr. Krista Blue   HISTORY  [name] Frank. is a 70 year old male, seen in request by his primary care physician Dr. Lamar Blinks for evaluation of possible seizure, history of subdural hematoma, initial evaluation was on October 09, 2018.   I have reviewed and summarized the referring note from the referring physician.  He had a past medical history of hypertension, type 2 diabetes, coronary artery disease, paroxysmal atrial fibrillation, he had an unwitnessed fall at the bottom of the stairs, was brought to the emergency room on Jul 14, 2018, I personally reviewed CT head, acute subdural hematoma along the left cerebral convexity measuring up to 11 mm in thickness, patchy subarachnoid hemorrhage with a traumatic pattern, negative for cervical spine fracture.  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  He denies gait abnormality, memory loss at that time.   He readmitted to the hospital on Jul 28, 2018 for more somnolent, slept all day on May 16 sudden onset garbled speech, right facial droop, transient confusion, emergency room blood pressure 90/53, CT head showed developing community hydrocephalus, likely due to recent subarachnoid and intraventricular hemorrhage, no surgical intervention was indicated per neurosurgery evaluation, suspicious for complex partial seizure, he was discharged with Keppra 500 mg twice a day   Since recent hospital admission, there was no clinical seizure, but he was noted significant memory loss, he states, repeating questions, slept a lot, lost  control of his urine, also has mild gait abnormality,   He is always a slow walker, he tends to wait on him, by reviewing the initial CT scan on Jul 14, 2018, there is already some evidence of hydrocephalus, enlarged bilateral lateral ventricle,   I personally reviewed MRI of the brain on Jul 29, 2018, persistent ventriculomegaly, suspected developing communicating hydrocephalus, no evidence of acute stroke, 10 mm left subdural hematoma, with no mass-effect on the hemisphere,   Laboratory evaluations showed normal ESR, C-reactive protein, ANA, HIV, RPR, B12, A1c was 7.2, hemoglobin was 12.8,   Update November 19, 2018: He continues to have significant memory loss, gait abnormality, worsening urinary incontinence, this is all new since May 2020, he retired as an Engineer, 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[employer], later worked as a Microbiologist, just finished a complicated project in April 2020, now he has difficulty operating coffee machine, difficulty initiating his gait, fell few times,   We have personally reviewed MRI CT scans, compared with multiple scans he had a since May 2020, most recent MRI brain November 15, 2018: Resolution of left subdural hematoma, ventriculomegaly out of proportion to the extent of mild generalized cortical atrophy, additionally there appears to be mild transependymal flow, this findings are consistent with communicating normal pressure hydrocephalus   With his rapid onset progressively worsening symptoms, I think he would be a good candidate for potential decompression, I was able to talk with neurosurgery on-call Dr. Zada Finders, will see him on September 9   He is tolerating Keppra 500 mg twice daily well, there was no recurrent seizure   He has tried Sinemet 25/100 mg  3 times daily, there was no significant improvement noted   UPDATE Oct 6th 2020: I reviewed operation record by Dr. Zada Finders, he had shunt replacement December 02, 2018.   He was seen by physical  therapy on December 03, 2018, the day postsurgical, patient with significant posterior lean, poor trunk control, required bilateral upper extremity support,  needs assistance, he reported overall 60% improvement in his walking, urinary frequency and urgency has much improved, still has mild memory loss   I reviewed CT head December 03, 2018: Operative changes from interval right bur hole craniotomy for VP shunt placement,   UPDATE Mar 17 2019: I personally reviewed CT head on Mar 01 2020:  Slight further decrease in lateral ventricle size since October. Stable right superior frontal approach shunt. No adverse features.  He is overall doing well, wife reported that he is back to his baseline, he is sedentary, is no longer taking Keppra 500 mg twice daily, we went over the initial event in May 2020, when he had unwitnessed fall at the ground level, but with resulting subdural hematoma, he had no recollection of the fall, possibility including seizure, after discussion, we decided to proceed with Keppra 500 mg twice a day   Lab in Dec 2020: LDL 111, CMP, glucose 299, creat 0.78, A1C 9.5   UPDATE Sept 13 2021: Patient presented to emergency room on August 11, 2019 for aphasia, word finding difficulties, left facial droop,   Personally reviewed MRI of the brain on August 12, 2019, no acute abnormality, right frontal VP shunt, stable size,   CT angiogram of head and neck showed no significant large vessel disease, intracranial atherosclerotic disease,   Following her hospital discharge she continued to have intermittent slurred speech, right facial droop, mixed with lucid time, he also increased confusion to the point of urinary incontinence, stat EEG showed left hemispheric rhythmic slowing, consistent with his partial status epilepticus,   He was loaded with Depacon IV, and was sent to emergency room, placed on Depakote DR 750 mg 3 times a day, later switched to ER 1000 mg twice a day, along with Keppra  1000 mg twice daily Keppra level 11.9, TSH 1.820, A1C 10.5.    EEG by Dr. Hortense Ramal on August 14, 2019 showed left frontotemporal region rhythmic delta activity, a total of interictal continuum, indicating potential epileptogenicity.   Repeat EEG on August 15, 2019 is much improved with higher dose of anti-epileptic medications, also correlate with clinical improvement, I also reviewed cardiology visit on August 28, 2019 by Dr. Kathreen Devoid Rohrbeck, coronary artery disease involving native coronary artery without angina pectoris, hypertension, paroxysmal atrial fibrillation, uncontrolled diabetes with microalbuminemia, mixed hyperlipidemia,   Dr. Marijo File think patient is doing quite well from cardiac standpoint, he has been off aspirin since summer of 2020, now he has gone back on aspirin 81 mg daily,   UPDATE Jul 12 2020: He drove  himself to clinic today, tolerating lamotrigine 100 mg twice a day well, there was no recurrent seizure, continue has mild gait abnormality, tripped and fell couple times over the past week, with bruising in his right fingers, left knee  Update September 19, 2020 SS: Here today alone, drove himself here today. He looks well today. Saw PCP back in May. Taking all his medications currently, very organized today. Using single point cane, 1 month ago, fell in drive way, healing bruise to right hand. Medications currently don't cause any side effect. No seizures.  Back in May 2022, A1C 11.3,  CBC, CMP looked good, glucose was 271, TSH 1.48.   REVIEW OF SYSTEMS: Out of a complete 14 system review of symptoms, the patient complains only of the following symptoms, and all other reviewed systems are negative.  ALLERGIES: No Known Allergies  HOME MEDICATIONS: Outpatient Medications Prior to Visit  Medication Sig Dispense Refill   aspirin 81 MG EC tablet Take 1 tablet by mouth daily.     canagliflozin (INVOKANA) 100 MG TABS tablet Take 1 tablet (100 mg total) by mouth daily before  breakfast. 30 tablet 6   glipiZIDE (GLUCOTROL XL) 5 MG 24 hr tablet Take 1 tablet (5 mg total) by mouth daily. Increase to twice a day after 1 weeks assuming no symptoms of low blood sugar 180 tablet 3   lamoTRIgine (LAMICTAL) 100 MG tablet Take 1 tablet (100 mg total) by mouth 2 (two) times daily. 180 tablet 4   mupirocin ointment (BACTROBAN) 2 % Apply 1 application topically 3 (three) times daily. 22 each 0   rosuvastatin (CRESTOR) 5 MG tablet Take 1 tablet by mouth daily.     atorvastatin (LIPITOR) 80 MG tablet Take 1 tablet (80 mg total) by mouth daily. (Patient taking differently: Take 80 mg by mouth every evening.) 90 tablet 3   No facility-administered medications prior to visit.    PAST MEDICAL HISTORY: Past Medical History:  Diagnosis Date   Abnormality of gait    uses walker and wheelchair   Cognitive deficit as late effect of traumatic brain injury [facility])    Coronary artery disease    cardiologist-- dr s. Jake Bathe---  04-06-2014  NSTEMI  s/p  cardiac cath DES x1 to LAD and POBA to D1   Dysrhythmia    History of non-ST elevation myocardial infarction (NSTEMI)    04-06-2013  s/p  coronary stent and PCI   Hydrocele, bilateral    Hyperlipemia    Hypertension    Lower urinary tract symptoms (LUTS)    Memory deficit    PAF (paroxysmal atrial fibrillation) ([place]) cardilogist-- dr Jake Bathe   on-set 05/ 2020 during admission for [facility],  w/ RVR,  with cardizem/ toprol, back in [facility]R,  f/u by cardiologist , event monitor 09-16-2018 no results in epic by per pt wife was told result ok with no afib   S/P drug eluting coronary stent placement    04-06-2014  _0   DES x1 to LAD and POBA to D1   SAH (subarachnoid hemorrhage) [facility]) neurologist-  dr Krista Blue--- last head CT 10-01-2018 resolving (10-23-2018  residual deficits memory issues, abnormal gait, congnitive   w/ SDH due to unwitnessed fall, traumatic head injury---- admission 07-14-2018 , d/c'd 08-01-2018,  readmitted 3 days later with late TBI  effects,  d/c'd from rehab 08-22-2018   Seizure disorder [facility])    followed by dr Krista Blue   Subdural hematoma ([place])    Type 2 diabetes mellitus ([place])    followed by pcp   Urinary incontinence    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    PAST SURGICAL HISTORY: Past Surgical History:  Procedure Laterality Date   CATARACT EXTRACTION W/ INTRAOCULAR LENS  IMPLANT, BILATERAL  2008; 2009   CORONARY ANGIOPLASTY WITH STENT PLACEMENT  04-06-2014  _1    DES x1 to LAD and POBA to D1   HYDROCELE EXCISION Bilateral 10/27/2018   Procedure: HYDROCELECTOMY ADULT;  Surgeon: [name], MD;  Location[facility];  Service: Urology;  Laterality: Bilateral;   TONSILLECTOMY  child   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   Procedure:  Right ventriculoperitoneal shunt placement;  Surgeon: Judith Part, MD;  Location: [place];  Service: Neurosurgery;  Laterality: Right;    FAMILY HISTORY: Family History  Problem Relation Age of Onset   Stroke Father    Hypertension Father    Hyperlipidemia Father    Early death Mother    Colitis Sister    Appendicitis Brother     SOCIAL HISTORY: Social History   Socioeconomic History   Marital status: Married    Spouse name: Not on file   Number of children: 2   Years of education: college   Highest education level: Master's degree (e.g., MA, MS, MEng, MEd, MSW, MBA)  Occupational History   Occupation: Retired  Tobacco Use   Smoking status: Never   Smokeless tobacco: Never  Vaping Use   Vaping Use: Never used  Substance and Sexual Activity   Alcohol use: Not Currently    Comment: VERY RARELY   Drug use: Never   Sexual activity: Not on file  Other Topics Concern   Not on file  Social History Narrative   Lives at home with his wife.   Right-handed.   Limited caffeine use.   Social Determinants of Health   Financial Resource Strain: Not on file  Food Insecurity: Not on file  Transportation Needs: Not on file  Physical Activity: Not on  file  Stress: Not on file  Social Connections: Not on file  Intimate Partner Violence: Not on file   PHYSICAL EXAM  Vitals:   09/19/20 1445  BP: 127/72  Pulse: (!) 56  Weight: 207 lb (93.9 kg)  Height: _0  (1.753 m)   Body mass index is 30.57 kg/m.  Generalized: Well developed, in no acute distress   Neurological examination  Mentation: Alert oriented to time, place, history taking. Follows all commands speech and language fluent Cranial nerve II-XII: Left pupil is 4 mm, right is 3 mm. Extraocular movements were full, visual field were full on confrontational test. Facial sensation and strength were normal. Head turning and shoulder shrug  were normal and symmetric. Motor: The motor testing reveals 5 over 5 strength of all 4 extremities. Good symmetric motor tone is noted throughout.  Sensory: Sensory testing is intact to soft touch on all 4 extremities. No evidence of extinction is noted.  Coordination: Cerebellar testing reveals good finger-nose-finger and heel-to-shin bilaterally.  Gait and station: Has to push off from seated position to stand, gait is slightly wide-based, cautious, uses single-point cane Reflexes: Deep tendon reflexes are symmetric and normal bilaterally.   DIAGNOSTIC DATA (LABS, IMAGING, TESTING) - I reviewed patient records, labs, notes, testing and imaging myself where available.  Lab Results  Component Value Date   WBC 4.7 07/14/2020   HGB 15.7 07/14/2020   HCT 45.9 07/14/2020   MCV 85.9 07/14/2020   PLT 173.0 07/14/2020      Component Value Date/Time   NA 135 07/14/2020 1556   NA 138 08/18/2019 1455   K 4.7 07/14/2020 1556   CL 101 07/14/2020 1556   CO2 25 07/14/2020 1556   GLUCOSE 271 (H) 07/14/2020 1556   BUN 20 07/14/2020 1556   BUN 20 08/18/2019 1455   CREATININE 0.91 07/14/2020 1556   CALCIUM 9.6 07/14/2020 1556   PROT 7.0 07/14/2020 1556   PROT 6.2 08/18/2019 1455   ALBUMIN 4.5 07/14/2020 1556   ALBUMIN 3.9 08/18/2019 1455    AST 10 07/14/2020 1556   ALT 13 07/14/2020 1556   ALKPHOS 70 07/14/2020 1556  BILITOT 0.6 07/14/2020 1556   BILITOT 0.4 08/18/2019 1455   GFRNONAA 91 08/18/2019 1455   GFRAA 105 08/18/2019 1455   Lab Results  Component Value Date   CHOL 217 (H) 07/14/2020   HDL 33.20 (L) 07/14/2020   LDLCALC 148 (H) 07/14/2020   TRIG 175.0 (H) 07/14/2020   CHOLHDL 7 07/14/2020   Lab Results  Component Value Date   HGBA1C 11.3 (H) 07/14/2020   Lab Results  Component Value Date   VITAMINB12 264 10/09/2018   Lab Results  Component Value Date   TSH 1.48 07/14/2020   ASSESSMENT AND PLAN 70 y.o. year old male  has a past medical history of Abnormality of gait, Cognitive deficit as late effect of traumatic brain injury ([place]), Coronary artery disease, Dysrhythmia, History of non-ST elevation myocardial infarction (NSTEMI), Hydrocele, bilateral, Hyperlipemia, Hypertension, Lower urinary tract symptoms (LUTS), Memory deficit, PAF (paroxysmal atrial fibrillation) ([place]) (cardilogist-- dr Jake Bathe), S/P drug eluting coronary stent placement, SAH (subarachnoid hemorrhage) ([place]) (neurologist-  dr Krista Blue--- last head CT 10-01-2018 resolving (10-23-2018  residual deficits memory issues, abnormal gait, congnitive), Seizure disorder ([place]), Subdural hematoma ([place]), Type 2 diabetes mellitus ([place]), and Urinary incontinence. here with: History of fall on Jul 14, 2018 with left convexity subdural hematoma, subarachnoid hemorrhage 2.   Normal pressure hydrocephalus, status post VP shunt placement December 02, 2018 3.   Partial status epilepticus (hospitalized 6/3-08/17/2019) 4.  Mild cognitive impairment (combination of CNS degenerative disorder, hydrocephalus status post VP shunt)  Doing well today, is clear headed, organized, big improvement from previous visits, doing well on medications  No recurrent seizures, continue Lamictal 100 mg twice daily, will check level today  Last A1c 11.3 in May 2022, back on medications  now EEG in October 2011 mildly abnormal, evidence of intermittent left-sided slowing and T3, T5 sharp transient indicating left hemisphere irritability             Patient stopped his antiepileptic medications in his visit in March 2022, complains side effect              Depakote caused diarrhea and heartburn; Keppra resulted in fatigue/drowsiness;              Repeat EEG on July 06, 2020 was normal   Follow-up in 6 months or sooner if needed.  I spent 30 minutes of face-to-face and non-face-to-face time with patient.  This included previsit chart review, lab review, study review, order entry, discussing medications, management, and follow-up.   Butler Denmark, AGNP-C, DNP 09/19/2020, 3:46 PM [facility] [address] [place], [place] [address] [phone]

## 2020-09-19 NOTE — Patient Instructions (Addendum)
Check Lamictal level today  Keep taking current medications Call for seizures or problems See you back in 6 months  Need to call PCP and make appointment in August timeframe

## 2020-09-20 LAB — LAMOTRIGINE LEVEL: Lamotrigine Lvl: 2.4 ug/mL (ref 2.0–20.0)

## 2020-09-21 ENCOUNTER — Telehealth: Payer: Self-pay

## 2020-09-21 NOTE — Telephone Encounter (Signed)
Attempted to call pt, LVM for normal results per DPR. Ask pt to call back for questions or concerns.  

## 2020-09-21 NOTE — Telephone Encounter (Signed)
-----   Message from Glean Salvo, NP sent at 09/21/2020  5:52 AM EDT ----- He would like to be called, his Lamictal level is 2.4, indicates he is taking the Lamictal, which is great! We have room to increase if needed, but for now continue current dose, call for any seizure concern! He is doing much better!

## 2020-11-16 DIAGNOSIS — E119 Type 2 diabetes mellitus without complications: Secondary | ICD-10-CM | POA: Diagnosis not present

## 2020-11-16 DIAGNOSIS — I480 Paroxysmal atrial fibrillation: Secondary | ICD-10-CM | POA: Diagnosis not present

## 2020-11-16 DIAGNOSIS — I10 Essential (primary) hypertension: Secondary | ICD-10-CM | POA: Diagnosis not present

## 2020-11-16 DIAGNOSIS — I2510 Atherosclerotic heart disease of native coronary artery without angina pectoris: Secondary | ICD-10-CM | POA: Diagnosis not present

## 2020-11-16 DIAGNOSIS — E782 Mixed hyperlipidemia: Secondary | ICD-10-CM | POA: Diagnosis not present

## 2020-11-21 ENCOUNTER — Other Ambulatory Visit: Payer: Self-pay

## 2020-11-21 ENCOUNTER — Telehealth: Payer: Self-pay

## 2020-11-21 ENCOUNTER — Ambulatory Visit (INDEPENDENT_AMBULATORY_CARE_PROVIDER_SITE_OTHER): Payer: Medicare HMO

## 2020-11-21 ENCOUNTER — Encounter: Payer: Self-pay | Admitting: Family Medicine

## 2020-11-21 VITALS — BP 120/82 | HR 66 | Temp 97.7°F | Resp 16 | Ht 69.0 in | Wt 209.0 lb

## 2020-11-21 DIAGNOSIS — Z0000 Encounter for general adult medical examination without abnormal findings: Secondary | ICD-10-CM

## 2020-11-21 NOTE — Telephone Encounter (Signed)
Patient is asking for a handicap parking placard. He has an appt next Monday 11/28/20 & would like to pick it up that day if possible.

## 2020-11-21 NOTE — Progress Notes (Signed)
Subjective:   [name] Frank. is a 70 y.o. male who presents for an Initial Medicare Annual Wellness Visit.   Review of Systems     Cardiac Risk Factors include: advanced age (>28men, >97 women);male gender;obesity (BMI >30kg/m2);diabetes mellitus;dyslipidemia;hypertension     Objective:    Today's Vitals   11/21/20 1330  BP: 120/82  Pulse: 66  Resp: 16  Temp: 97.7 F (36.5 C)  TempSrc: Temporal  SpO2: 97%  Weight: 209 lb (94.8 kg)  Height: 5\' 9"  (1.753 m)   Body mass index is 30.86 kg/m.  Advanced Directives 11/21/2020 08/11/2019 12/02/2018 11/28/2018 10/27/2018 08/01/2018 07/28/2018  Does Patient Have a Medical Advance Directive? Yes No Yes Yes Yes No No  Type of 07/30/2018 of [place];Living will - Healthcare Power of [place];Living will Healthcare Power of [place];Living will Healthcare Power of [place];Living will - -  Does patient want to make changes to medical advance directive? - - No - Patient declined No - Patient declined No - Patient declined - -  Copy of Healthcare Power of Attorney in Chart? No - copy requested - - No - copy requested No - copy requested - -  Would patient like information on creating a medical advance directive? - No - Patient declined - - - No - Patient declined No - Patient declined    Current Medications (verified) Outpatient Encounter Medications as of 11/21/2020  Medication Sig   aspirin 81 MG EC tablet Take 1 tablet by mouth daily.   canagliflozin (INVOKANA) 100 MG TABS tablet Take 1 tablet (100 mg total) by mouth daily before breakfast.   glipiZIDE (GLUCOTROL XL) 5 MG 24 hr tablet Take 1 tablet (5 mg total) by mouth daily. Increase to twice a day after 1 weeks assuming no symptoms of low blood sugar   lamoTRIgine (LAMICTAL) 100 MG tablet Take 1 tablet (100 mg total) by mouth 2 (two) times daily.   mupirocin ointment (BACTROBAN) 2 % Apply 1 application topically 3 (three) times daily.   rosuvastatin (CRESTOR) 5  MG tablet Take 1 tablet by mouth daily.   No facility-administered encounter medications on file as of 11/21/2020.    Allergies (verified) Patient has no known allergies.   History: Past Medical History:  Diagnosis Date   Abnormality of gait    uses walker and wheelchair   Cognitive deficit as late effect of traumatic brain injury [facility])    Coronary artery disease    cardiologist-- dr s. [facility]---  04-06-2014  NSTEMI  s/p  cardiac cath DES x1 to LAD and POBA to D1   Dysrhythmia    History of non-ST elevation myocardial infarction (NSTEMI)    04-06-2013  s/p  coronary stent and PCI   Hydrocele, bilateral    Hyperlipemia    Hypertension    Lower urinary tract symptoms (LUTS)    Memory deficit    PAF (paroxysmal atrial fibrillation) (HCC) cardilogist-- dr 04-08-2013   on-set 05/ 2020 during admission for [facility],  w/ RVR,  with cardizem/ toprol, back in NSR,  f/u by cardiologist , event monitor 09-16-2018 no results in epic by per pt wife was told result ok with no afib   S/P drug eluting coronary stent placement    04-06-2014  @[facility]   DES x1 to LAD and POBA to D1   SAH (subarachnoid hemorrhage) [facility]) neurologist-  dr --- last head CT 10-01-2018 resolving (10-23-2018  residual deficits memory issues, abnormal gait, congnitive   w/ SDH due to unwitnessed  fall, traumatic head injury---- admission 07-14-2018 , d/c'd 08-01-2018,  readmitted 3 days later with late TBI effects,  d/c'd from rehab 08-22-2018   Seizure disorder [facility](HCC)    followed by dr Terrace Arabiayan   Subdural hematoma (HCC)    Type 2 diabetes mellitus (HCC)    followed by pcp   Urinary incontinence    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   Past Surgical History:  Procedure Laterality Date   CATARACT EXTRACTION W/ INTRAOCULAR LENS  IMPLANT, BILATERAL  2008; 2009   CORONARY ANGIOPLASTY WITH STENT PLACEMENT  04-06-2014  @[facility]    DES x1 to LAD and POBA to D1   HYDROCELE EXCISION Bilateral 10/27/2018   Procedure: HYDROCELECTOMY ADULT;  Surgeon: Ihor Gullyttelin,  Mark, MD;  [facility] [place];  Service: Urology;  Laterality: Bilateral;   TONSILLECTOMY  child   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   Procedure: Right ventriculoperitoneal shunt placement;  Surgeon: Jadene Pierinistergard, Thomas A, MD;  [facility];  Service: Neurosurgery;  Laterality: Right;   Family History  Problem Relation Age of Onset   Stroke Father    Hypertension Father    Hyperlipidemia Father    Early death Mother    Colitis Sister    Appendicitis Brother    Social History   Socioeconomic History   Marital status: Married    Spouse name: Not on file   Number of children: 2   Years of education: college   Highest education level: Master's degree (e.g., MA, MS, MEng, MEd, MSW, MBA)  Occupational History   Occupation: Retired  Tobacco Use   Smoking status: Never   Smokeless tobacco: Never  Vaping Use   Vaping Use: Never used  Substance and Sexual Activity   Alcohol use: Not Currently    Comment: VERY RARELY   Drug use: Never   Sexual activity: Not on file  Other Topics Concern   Not on file  Social History Narrative   Lives at home with his wife.   Right-handed.   Limited caffeine use.   Social Determinants of Health   Financial Resource Strain: Low Risk    Difficulty of Paying Living Expenses: Not hard at all  Food Insecurity: No Food Insecurity   Worried About Programme researcher, broadcasting/film/videounning Out of Food in the Last Year: Never true   Ran Out of Food in the Last Year: Never true  Transportation Needs: No Transportation Needs   Lack of Transportation (Medical): No   Lack of Transportation (Non-Medical): No  Physical Activity: Inactive   Days of Exercise per Week: 0 days   Minutes of Exercise per Session: 0 min  Stress: No Stress Concern Present   Feeling of Stress : Not at all  Social Connections: Socially Isolated   Frequency of Communication with Friends and Family: Once a week   Frequency of Social Gatherings with Friends and Family: Once a week    Attends Religious Services: Never   Diplomatic Services operational officerActive Member of Clubs or Organizations: Not on file   Attends BankerClub or Organization Meetings: Never   Marital Status: Married    Tobacco Counseling Counseling given: Not Answered   Clinical Intake:  Pre-visit preparation completed: Yes  Pain : No/denies pain     BMI - recorded: 30.86 Nutritional Status: BMI > 30  Obese Nutritional Risks: None Diabetes: Yes CBG done?: No Did pt. bring in CBG monitor from home?: No  How often do you need to have someone help you when you read instructions, pamphlets, or other written materials from your doctor or  pharmacy?: 1 - Never Diabetes:  Is the patient diabetic?  Yes  If diabetic, was a CBG obtained today?  No  Did the patient bring in their glucometer from home?  No  How often do you monitor your CBG's? never.   Financial Strains and Diabetes Management:  Are you having any financial strains with the device, your supplies or your medication? No .  Does the patient want to be seen by Chronic Care Management for management of their diabetes?  No  Would the patient like to be referred to a Nutritionist or for Diabetic Management?  No   Diabetic Exams:  Diabetic Eye Exam: Completed 2022-per patient. Specific date unknown. Patient does not know his eye doctor's name.  Diabetic Foot Exam: Completed 07/14/2020.    Interpreter Needed?: No  Information entered by :: Thomasenia Sales LPN   Activities of Daily Living In your present state of health, do you have any difficulty performing the following activities: 11/21/2020 07/14/2020  Hearing? N N  Vision? N N  Difficulty concentrating or making decisions? Y N  Comment occasionally -  Walking or climbing stairs? N N  Dressing or bathing? N N  Doing errands, shopping? N N  Preparing Food and eating ? N -  Using the Toilet? N -  In the past six months, have you accidently leaked urine? N -  Do you have problems with loss of bowel control? N -   Managing your Medications? N -  Managing your Finances? N -  Housekeeping or managing your Housekeeping? N -  Some recent data might be hidden    Patient Care Team: Copland, Gwenlyn Found, MD as PCP - General (Family Medicine) Dot Been Salvatore Decent., MD as PCP - Cardiology (Cardiology)  Indicate any recent Medical Services you may have received from other than [facility] providers in the past year (date may be approximate).     Assessment:   This is a routine wellness examination for Dwon.  Hearing/Vision screen Hearing Screening - Comments:: No issues Vision Screening - Comments:: Last eye exam-2022  Dietary issues and exercise activities discussed: Current Exercise Habits: The patient does not participate in regular exercise at present, Exercise limited by: Other - see comments (balance issues)   Goals Addressed             This Visit's Progress    Patient Stated       Lose some weight       Depression Screen PHQ 2/9 Scores 11/21/2020 07/14/2020 01/16/2018  PHQ - 2 Score 0 0 0    Fall Risk Fall Risk  11/21/2020 07/14/2020 09/04/2018  Falls in the past year? Number falls in past yr: Comment - - last fall early June 2020  Injury with Fall? Risk for fall due to : History of fall(s);Impaired balance/gait Impaired balance/gait;Impaired mobility -  Follow up Falls prevention discussed - -    FALL RISK PREVENTION PERTAINING TO THE HOME:  Any stairs in or around the home? Yes  If so, are there any without handrails? No  Home free of loose throw rugs in walkways, pet beds, electrical cords, etc? Yes  Adequate lighting in your home to reduce risk of falls? Yes   ASSISTIVE DEVICES UTILIZED TO PREVENT FALLS:  Life alert? No  Use of a cane, walker or w/c? Yes  Grab bars in the bathroom? Yes  Shower chair or bench in shower? No  Elevated toilet seat  or a handicapped toilet? No   TIMED UP AND GO:  Was the test performed? Yes .  Length of time to ambulate 10  feet: 12 sec.   Gait slow and steady with assistive device  Cognitive Function:Patient currently seeing neurologoist.   Montreal Cognitive Assessment  07/12/2020  Visuospatial/ Executive (0/5) 5  Naming (0/3) 3  Attention: Read list of digits (0/2) 2  Attention: Read list of letters (0/1) 1  Attention: Serial 7 subtraction starting at 100 (0/3) 1  Language: Repeat phrase (0/2) 2  Language : Fluency (0/1) 1  Abstraction (0/2) 2  Delayed Recall (0/5) 0  Orientation (0/6) 5  Total 22      Immunizations Immunization History  Administered Date(s) Administered   Influenza, High Dose Seasonal PF 01/16/2018   Influenza-Unspecified 01/21/2019   PFIZER(Purple Top)SARS-COV-2 Vaccination 05/07/2019, 06/02/2019, 01/29/2020   Pneumococcal Conjugate-13 01/16/2018   Pneumococcal Polysaccharide-23 02/19/2019   Td 07/14/2020    TDAP status: Up to date  Flu Vaccine status: Due, Education has been provided regarding the importance of this vaccine. Advised may receive this vaccine at local pharmacy or Health Dept. Aware to provide a copy of the vaccination record if obtained from local pharmacy or Health Dept. Verbalized acceptance and understanding.  Pneumococcal vaccine status: Up to date  Covid-19 vaccine status: Information provided on how to obtain vaccines. Booster due  Qualifies for Shingles Vaccine? Yes   Zostavax completed No   Shingrix Completed?: No.    Education has been provided regarding the importance of this vaccine. Patient has been advised to call insurance company to determine out of pocket expense if they have not yet received this vaccine. Advised may also receive vaccine at local pharmacy or Health Dept. Verbalized acceptance and understanding.  Screening Tests Health Maintenance  Topic Date Due   OPHTHALMOLOGY EXAM  Never done   URINE MICROALBUMIN  Never done   Zoster Vaccines- Shingrix (1 of 2) Never done   COVID-19 Vaccine (4 - Booster for Pfizer series) 05/28/2020    INFLUENZA VACCINE  10/10/2020   HEMOGLOBIN A1C  01/14/2021   FOOT EXAM  07/14/2021   COLONOSCOPY (Pts 45-68yrs Insurance coverage will need to be confirmed)  03/31/2028   TETANUS/TDAP  07/15/2030   Hepatitis C Screening  Completed   PNA vac Low Risk Adult  Completed   HPV VACCINES  Aged Out    Health Maintenance  Health Maintenance Due  Topic Date Due   OPHTHALMOLOGY EXAM  Never done   URINE MICROALBUMIN  Never done   Zoster Vaccines- Shingrix (1 of 2) Never done   COVID-19 Vaccine (4 - Booster for Pfizer series) 05/28/2020   INFLUENZA VACCINE  10/10/2020    Colorectal cancer screening: Type of screening: Colonoscopy. Completed 03/31/2018. Repeat every 10 years  Lung Cancer Screening: (Low Dose CT Chest recommended if Age 48-80 years, 30 pack-year currently smoking OR have quit w/in 15years.) does not qualify.     Additional Screening:  Hepatitis C Screening: Completed 01/16/2018  Vision Screening: Recommended annual ophthalmology exams for early detection of glaucoma and other disorders of the eye. Is the patient up to date with their annual eye exam?  Yes  Who is the provider or what is the name of the office in which the patient attends annual eye exams? Pt unsure of name   Dental Screening: Recommended annual dental exams for proper oral hygiene  Community Resource Referral / Chronic Care Management: CRR required this visit?  No   CCM required this  visit?  No      Plan:     I have personally reviewed and noted the following in the patient's chart:   Medical and social history Use of alcohol, tobacco or illicit drugs  Current medications and supplements including opioid prescriptions. Patient is not currently taking opioid prescriptions. Functional ability and status Nutritional status Physical activity Advanced directives List of other physicians Hospitalizations, surgeries, and ER visits in previous 12 months Vitals Screenings to include cognitive,  depression, and falls Referrals and appointments  In addition, I have reviewed and discussed with patient certain preventive protocols, quality metrics, and best practice recommendations. A written personalized care plan for preventive services as well as general preventive health recommendations were provided to patient.   Patient would like to access avs on my-chart.   Roanna Raider, LPN   2/29/7989  Nurse Health Advisor  Nurse Notes: Patient asking for a handicap parking placard. Has an upcoming appt next week with PCP.

## 2020-11-21 NOTE — Patient Instructions (Signed)
[name] , Thank you for taking time to come for your Medicare Wellness Visit. I appreciate your ongoing commitment to your health goals. Please review the following plan we discussed and let me know if I can assist you in the future.   Screening recommendations/referrals: Colonoscopy: Completed 03/31/2018-Due 03/31/2028 Recommended yearly ophthalmology/optometry visit for glaucoma screening and checkup Recommended yearly dental visit for hygiene and checkup  Vaccinations: Influenza vaccine: Due Pneumococcal vaccine: Up to date Tdap vaccine: Up to date-Due-07/15/2030 Shingles vaccine: Discuss with pharmacy   Covid-19: Booster due  Advanced directives: Please bring a copy for your chart  Conditions/risks identified: See problem list  Next appointment: Follow up in one year for your annual wellness visit.   Preventive Care 68 Years and Older, Male Preventive care refers to lifestyle choices and visits with your health care provider that can promote health and wellness. What does preventive care include? A yearly physical exam. This is also called an annual well check. Dental exams once or twice a year. Routine eye exams. Ask your health care provider how often you should have your eyes checked. Personal lifestyle choices, including: Daily care of your teeth and gums. Regular physical activity. Eating a healthy diet. Avoiding tobacco and drug use. Limiting alcohol use. Practicing safe sex. Taking low doses of aspirin every day. Taking vitamin and mineral supplements as recommended by your health care provider. What happens during an annual well check? The services and screenings done by your health care provider during your annual well check will depend on your age, overall health, lifestyle risk factors, and family history of disease. Counseling  Your health care provider may ask you questions about your: Alcohol use. Tobacco use. Drug use. Emotional well-being. Home and  relationship well-being. Sexual activity. Eating habits. History of falls. Memory and ability to understand (cognition). Work and work Astronomer. Screening  You may have the following tests or measurements: Height, weight, and BMI. Blood pressure. Lipid and cholesterol levels. These may be checked every 5 years, or more frequently if you are over 63 years old. Skin check. Lung cancer screening. You may have this screening every year starting at age 60 if you have a 30-pack-year history of smoking and currently smoke or have quit within the past 15 years. Fecal occult blood test (FOBT) of the stool. You may have this test every year starting at age 39. Flexible sigmoidoscopy or colonoscopy. You may have a sigmoidoscopy every 5 years or a colonoscopy every 10 years starting at age 49. Prostate cancer screening. Recommendations will vary depending on your family history and other risks. Hepatitis C blood test. Hepatitis B blood test. Sexually transmitted disease (STD) testing. Diabetes screening. This is done by checking your blood sugar (glucose) after you have not eaten for a while (fasting). You may have this done every 1-3 years. Abdominal aortic aneurysm (AAA) screening. You may need this if you are a current or former smoker. Osteoporosis. You may be screened starting at age 8 if you are at high risk. Talk with your health care provider about your test results, treatment options, and if necessary, the need for more tests. Vaccines  Your health care provider may recommend certain vaccines, such as: Influenza vaccine. This is recommended every year. Tetanus, diphtheria, and acellular pertussis (Tdap, Td) vaccine. You may need a Td booster every 10 years. Zoster vaccine. You may need this after age 27. Pneumococcal 13-valent conjugate (PCV13) vaccine. One dose is recommended after age 57. Pneumococcal polysaccharide (PPSV23) vaccine. One dose  is recommended after age 15. Talk to your  health care provider about which screenings and vaccines you need and how often you need them. This information is not intended to replace advice given to you by your health care provider. Make sure you discuss any questions you have with your health care provider. Document Released: 03/25/2015 Document Revised: 11/16/2015 Document Reviewed: 12/28/2014 Elsevier Interactive Patient Education  2017 [place] Prevention in the Home Falls can cause injuries. They can happen to people of all ages. There are many things you can do to make your home safe and to help prevent falls. What can I do on the outside of my home? Regularly fix the edges of walkways and driveways and fix any cracks. Remove anything that might make you trip as you walk through a door, such as a raised step or threshold. Trim any bushes or trees on the path to your home. Use bright outdoor lighting. Clear any walking paths of anything that might make someone trip, such as rocks or tools. Regularly check to see if handrails are loose or broken. Make sure that both sides of any steps have handrails. Any raised decks and porches should have guardrails on the edges. Have any leaves, snow, or ice cleared regularly. Use sand or salt on walking paths during winter. Clean up any spills in your garage right away. This includes oil or grease spills. What can I do in the bathroom? Use night lights. Install grab bars by the toilet and in the tub and shower. Do not use towel bars as grab bars. Use non-skid mats or decals in the tub or shower. If you need to sit down in the shower, use a plastic, non-slip stool. Keep the floor dry. Clean up any water that spills on the floor as soon as it happens. Remove soap buildup in the tub or shower regularly. Attach bath mats securely with double-sided non-slip rug tape. Do not have throw rugs and other things on the floor that can make you trip. What can I do in the bedroom? Use night  lights. Make sure that you have a light by your bed that is easy to reach. Do not use any sheets or blankets that are too big for your bed. They should not hang down onto the floor. Have a firm chair that has side arms. You can use this for support while you get dressed. Do not have throw rugs and other things on the floor that can make you trip. What can I do in the kitchen? Clean up any spills right away. Avoid walking on wet floors. Keep items that you use a lot in easy-to-reach places. If you need to reach something above you, use a strong step stool that has a grab bar. Keep electrical cords out of the way. Do not use floor polish or wax that makes floors slippery. If you must use wax, use non-skid floor wax. Do not have throw rugs and other things on the floor that can make you trip. What can I do with my stairs? Do not leave any items on the stairs. Make sure that there are handrails on both sides of the stairs and use them. Fix handrails that are broken or loose. Make sure that handrails are as long as the stairways. Check any carpeting to make sure that it is firmly attached to the stairs. Fix any carpet that is loose or worn. Avoid having throw rugs at the top or bottom of the stairs.  If you do have throw rugs, attach them to the floor with carpet tape. Make sure that you have a light switch at the top of the stairs and the bottom of the stairs. If you do not have them, ask someone to add them for you. What else can I do to help prevent falls? Wear shoes that: Do not have high heels. Have rubber bottoms. Are comfortable and fit you well. Are closed at the toe. Do not wear sandals. If you use a stepladder: Make sure that it is fully opened. Do not climb a closed stepladder. Make sure that both sides of the stepladder are locked into place. Ask someone to hold it for you, if possible. Clearly mark and make sure that you can see: Any grab bars or handrails. First and last  steps. Where the edge of each step is. Use tools that help you move around (mobility aids) if they are needed. These include: Canes. Walkers. Scooters. Crutches. Turn on the lights when you go into a dark area. Replace any light bulbs as soon as they burn out. Set up your furniture so you have a clear path. Avoid moving your furniture around. If any of your floors are uneven, fix them. If there are any pets around you, be aware of where they are. Review your medicines with your doctor. Some medicines can make you feel dizzy. This can increase your chance of falling. Ask your doctor what other things that you can do to help prevent falls. This information is not intended to replace advice given to you by your health care provider. Make sure you discuss any questions you have with your health care provider. Document Released: 12/23/2008 Document Revised: 08/04/2015 Document Reviewed: 04/02/2014 Elsevier Interactive Patient Education  2017 [employer].

## 2020-11-25 NOTE — Progress Notes (Signed)
[facility] [address], [address] [place], [place] [address] [phone]  Date:  11/28/2020   Name:  [name] Frank.   DOB:  [birth year]   MRN:  [id number]  PCP:  Pearline Cables, MD    Chief Complaint: balance (Pt c/o: dizziness and knows that if he falls he would not be able to get up by himself. )   History of Present Illness:  [name] Frank. is a 70 y.o. 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  He was later found to have normal pressure hydrocephalus, VP shunt placed September 2020.  He did improve following treatment of his hydrocephalus He also had seizure activity about 1 year ago  Most recent visit with neurology in July-they noted he was doing quite well, thinking clearly.  No recent seizures, continue Lamictal  He was seen by his cardiologist earlier this month  New COVID booster- he plans to do this  Will do flu vaccine today  He does not want to check A1c today - " I note will be high"  Pt notes he has felt more dizzy again for a month or so- he is now using a cane.  He did not let his neurologist know as of yet   He has had a few falls.  He fell onto his right side outside his house about 2 months ago.  He broke his fall with his right hand   He would like a HD parking placard which we can certainly provide   He notes that his life insurance is very expensive  He wonders if he still needs this- I advised him that unfortunately I am not qualified to advise him about his insurance   He has jock itch and has tried some OTC meds but they did not work very well-he would like prescription for terbinafine  He is driving again  Not  doing PT right now-we discussed starting this again to work on his balance.  He did not feel he was all that beneficial and does not want to start back on this now    Lab Results  Component Value Date   HGBA1C 11.3 (H) 07/14/2020    Flu shot Patient Active Problem List   Diagnosis Date Noted   Partial symptomatic epilepsy with complex partial seizures, intractable, with status epilepticus (HCC) 07/12/2020   Gait abnormality 11/23/2019   Complex partial status epilepticus (HCC) 08/13/2019   Uncontrolled type 2 diabetes mellitus (HCC) 08/13/2019   Acute confusion 08/12/2019   Unspecified abnormalities of gait and mobility 12/16/2018   Status post ventriculo-peritoneal shunt placement 12/02/2018   Normal pressure hydrocephalus (HCC) 12/02/2018   History of subdural hematoma 11/18/2018   Urinary incontinence 11/18/2018   Memory loss 10/09/2018   Right hemiparesis (HCC) 09/04/2018   Cognitive deficit as late effect of traumatic brain injury (HCC) 09/04/2018   Bradycardia    Leukopenia    Hypoalbuminemia due to protein-calorie malnutrition (HCC)    Hydrocele    Hydrocephalus (HCC) 07/28/2018   Mild renal insufficiency 07/28/2018   Paroxysmal atrial fibrillation (HCC) 07/28/2018   Subdural hematoma (HCC) 07/14/2018   Hypertension, essential 01/16/2018   Coronary artery disease  involving native heart without angina pectoris 01/16/2018    Past Medical History:  Diagnosis Date   Abnormality of gait    uses walker and wheelchair   Cognitive deficit as late effect of traumatic brain injury [facility])    Coronary artery disease    cardiologist-- dr s. Dickie La---  04-06-2014  NSTEMI  s/p  cardiac cath DES x1 to LAD and POBA to D1   Dysrhythmia    History of non-ST elevation myocardial infarction (NSTEMI)    04-06-2013  s/p  coronary stent and PCI   Hydrocele, bilateral    Hyperlipemia    Hypertension    Lower urinary tract symptoms (LUTS)    Memory deficit    PAF (paroxysmal atrial  fibrillation) (HCC) cardilogist-- dr Dickie La   on-set 05/ 2020 during admission for [facility],  w/ RVR,  with cardizem/ toprol, back in NSR,  f/u by cardiologist , event monitor 09-16-2018 no results in epic by per pt wife was told result ok with no afib   S/P drug eluting coronary stent placement    04-06-2014  @[facility]   DES x1 to LAD and POBA to D1   SAH (subarachnoid hemorrhage) [facility]) neurologist-  dr [facility]--- last head CT 10-01-2018 resolving (10-23-2018  residual deficits memory issues, abnormal gait, congnitive   w/ SDH due to unwitnessed fall, traumatic head injury---- admission 07-14-2018 , d/c'd 08-01-2018,  readmitted 3 days later with late TBI effects,  d/c'd from rehab 08-22-2018   Seizure disorder [facility])    followed by dr [facility]   Subdural hematoma (HCC)    Type 2 diabetes mellitus (HCC)    followed by pcp   Urinary incontinence    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    Past Surgical History:  Procedure Laterality Date   CATARACT EXTRACTION W/ INTRAOCULAR LENS  IMPLANT, BILATERAL  2008; 2009   CORONARY ANGIOPLASTY WITH STENT PLACEMENT  04-06-2014  @[facility]    DES x1 to LAD and POBA to D1   HYDROCELE EXCISION Bilateral 10/27/2018   Procedure: HYDROCELECTOMY ADULT;  Surgeon: , MD;  Location: [facility];  Service: Urology;  Laterality: Bilateral;   TONSILLECTOMY  child   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   Procedure: Right ventriculoperitoneal shunt placement;  Surgeon: [facility] MD;  Location: [facility] OR;  Service: Neurosurgery;  Laterality: Right;    Social History   Tobacco Use   Smoking status: Never   Smokeless tobacco: Never  Vaping Use   Vaping Use: Never used  Substance Use Topics   Alcohol use: Not Currently    Comment: VERY RARELY   Drug use: Never    Family History  Problem Relation Age of Onset   Stroke Father    Hypertension Father    Hyperlipidemia Father    Early death Mother    Colitis Sister    Appendicitis Brother     No Known  Allergies  Medication list has been reviewed and updated.  Current Outpatient Medications on File Prior to Visit  Medication Sig Dispense Refill   aspirin 81 MG EC tablet Take 1 tablet by mouth daily.     canagliflozin (INVOKANA) 100 MG TABS tablet Take 1 tablet (100 mg total) by mouth daily before breakfast. 30 tablet 6   glipiZIDE (GLUCOTROL XL) 5 MG 24 hr tablet Take 1 tablet (5 mg total) by mouth daily. Increase to twice a day after 1 weeks assuming no symptoms of low blood sugar 180 tablet 3   lamoTRIgine (LAMICTAL) 100 MG tablet Take  1 tablet (100 mg total) by mouth 2 (two) times daily. 180 tablet 4   mupirocin ointment (BACTROBAN) 2 % Apply 1 application topically 3 (three) times daily. 22 each 0   rosuvastatin (CRESTOR) 5 MG tablet Take 1 tablet by mouth daily.     No current facility-administered medications on file prior to visit.    Review of Systems:  As per HPI- otherwise negative.   Physical Examination: Vitals:   11/28/20 1608  BP: 120/78  Pulse: 69  Resp: 18  Temp: (!) 97.5 F (36.4 C)  SpO2: 96%   Vitals:   11/28/20 1608  Weight: 207 lb 12.8 oz (94.3 kg)  Height: 5\' 9"  (1.753 m)   Body mass index is 30.69 kg/m. Ideal Body Weight: Weight in (lb) to have BMI = 25: 168.9  GEN: no acute distress.  Obese, looks well  HEENT: Atraumatic, Normocephalic.  Ears and Nose: No external deformity. CV: RRR, No M/G/R. No JVD. No thrill. No extra heart sounds. PULM: CTA B, no wheezes, crackles, rhonchi. No retractions. No resp. distress. No accessory muscle use. ABD: S, NT, ND EXTR: No c/c/e PSYCH: Normally interactive. Conversant.  The left pupil is larger than the right, this is stable however per neurology notes  He is able to walk with a somewhat shuffling gait, but appears steady on his feet He does have a cane Assessment and Plan: Tinea cruris - Plan: terbinafine (LAMISIL) 250 MG tablet  Need for influenza vaccination - Plan: Flu Vaccine QUAD High  Dose(Fluad)  Gait abnormality  Hypertension, essential  Uncontrolled type 2 diabetes mellitus with hyperglycemia (HCC)  Immunization due  Patient seen today for follow-up.  His main concern is that he feels that his dizziness and gait have been gradually getting worse.  He does have history of hydrocephalus and has a shunt in place.  I asked him to please contact his neurology team right away, as it will be best able to evaluate this issue Provided handicap parking placard information for the [facility] Prescription for Lamisil for jock itch Blood pressure looks okay Gave flu vaccine, he plans to get the new COVID booster soon He declines to check his A1c today.  I asked him to come and see me in 2 to 3 months and he agrees to do so-he plans to work on his diet in the meantime  This visit occurred during the SARS-CoV-2 public health emergency.  Safety protocols were in place, including screening questions prior to the visit, additional usage of staff PPE, and extensive cleaning of exam room while observing appropriate contact time as indicated for disinfecting solutions.   Signed [facility], MD

## 2020-11-28 ENCOUNTER — Ambulatory Visit (INDEPENDENT_AMBULATORY_CARE_PROVIDER_SITE_OTHER): Payer: Medicare HMO | Admitting: Family Medicine

## 2020-11-28 ENCOUNTER — Encounter: Payer: Self-pay | Admitting: Family Medicine

## 2020-11-28 ENCOUNTER — Other Ambulatory Visit: Payer: Self-pay

## 2020-11-28 VITALS — BP 120/78 | HR 69 | Temp 97.5°F | Resp 18 | Ht 69.0 in | Wt 207.8 lb

## 2020-11-28 DIAGNOSIS — R269 Unspecified abnormalities of gait and mobility: Secondary | ICD-10-CM | POA: Diagnosis not present

## 2020-11-28 DIAGNOSIS — B356 Tinea cruris: Secondary | ICD-10-CM | POA: Diagnosis not present

## 2020-11-28 DIAGNOSIS — E1165 Type 2 diabetes mellitus with hyperglycemia: Secondary | ICD-10-CM | POA: Diagnosis not present

## 2020-11-28 DIAGNOSIS — Z23 Encounter for immunization: Secondary | ICD-10-CM | POA: Diagnosis not present

## 2020-11-28 DIAGNOSIS — I10 Essential (primary) hypertension: Secondary | ICD-10-CM

## 2020-11-28 MED ORDER — TERBINAFINE HCL 250 MG PO TABS: 250.0000 mg | ORAL_TABLET | Freq: Every day | ORAL | 0 refills | Status: DC

## 2020-11-28 NOTE — Patient Instructions (Addendum)
Good to see you today- please contact neurology about your balance changes. Their may be a problem with your hydrocephalus  Flu shot today You are ok to get the new covid booster at your convenience Take the parking form I gave you to your DMV to get the car hanger placard   Please see me in 2- 3 months to check on your labs/ A1c

## 2020-12-14 NOTE — Progress Notes (Signed)
Chart reviewed, agree above plan ?

## 2020-12-27 ENCOUNTER — Telehealth: Payer: Self-pay | Admitting: Neurology

## 2020-12-27 NOTE — Telephone Encounter (Signed)
Pt called states his PCP told him he needed to see [place] before January due to the fluid build up. Pt requesting a call back.

## 2020-12-27 NOTE — Telephone Encounter (Signed)
Spoke to pt states he was seen by pcp and they reccommended he be seen sooner due to multiple falls and lack of balance, appt made with Dr. Terrace Arabia.

## 2020-12-30 DIAGNOSIS — G912 (Idiopathic) normal pressure hydrocephalus: Secondary | ICD-10-CM | POA: Diagnosis not present

## 2020-12-30 DIAGNOSIS — Z6830 Body mass index (BMI) 30.0-30.9, adult: Secondary | ICD-10-CM | POA: Diagnosis not present

## 2021-01-06 ENCOUNTER — Other Ambulatory Visit (HOSPITAL_BASED_OUTPATIENT_CLINIC_OR_DEPARTMENT_OTHER): Payer: Self-pay | Admitting: Neurological Surgery

## 2021-01-06 ENCOUNTER — Other Ambulatory Visit (HOSPITAL_COMMUNITY): Payer: Self-pay | Admitting: Neurological Surgery

## 2021-01-06 DIAGNOSIS — G912 (Idiopathic) normal pressure hydrocephalus: Secondary | ICD-10-CM

## 2021-01-10 ENCOUNTER — Ambulatory Visit (HOSPITAL_BASED_OUTPATIENT_CLINIC_OR_DEPARTMENT_OTHER): Payer: Medicare HMO

## 2021-01-10 ENCOUNTER — Other Ambulatory Visit: Payer: Self-pay | Admitting: Neurological Surgery

## 2021-01-16 ENCOUNTER — Other Ambulatory Visit: Payer: Self-pay

## 2021-01-16 ENCOUNTER — Ambulatory Visit (HOSPITAL_BASED_OUTPATIENT_CLINIC_OR_DEPARTMENT_OTHER)
Admission: RE | Admit: 2021-01-16 | Discharge: 2021-01-16 | Disposition: A | Discharge status: Home / Self Care | Payer: Medicare HMO | Source: Ambulatory Visit | Attending: Neurological Surgery | Admitting: Neurological Surgery

## 2021-01-16 DIAGNOSIS — G912 (Idiopathic) normal pressure hydrocephalus: Secondary | ICD-10-CM | POA: Insufficient documentation

## 2021-01-16 DIAGNOSIS — I6200 Nontraumatic subdural hemorrhage, unspecified: Secondary | ICD-10-CM | POA: Diagnosis not present

## 2021-01-16 DIAGNOSIS — Z01818 Encounter for other preprocedural examination: Secondary | ICD-10-CM | POA: Diagnosis not present

## 2021-01-16 IMAGING — CT CT HEAD W/O CM
3 series · 16 of 47 positions shown, 19 images · non-contrast
Comparison: [DATE]

CLINICAL DATA: Malfunctioning shunt. Preoperative evaluation.
History of normal pressure hydrocephalus.

EXAM:
CT HEAD WITHOUT CONTRAST
TECHNIQUE: Contiguous axial images were obtained from the base of the skull
through the vertex without intravenous contrast.

[Series 2: head wo · axial · 0.45mm/px · z∈[-179,-39]mm · 10 of 34 slices shown, 13 images]
[im 3/34  brain]
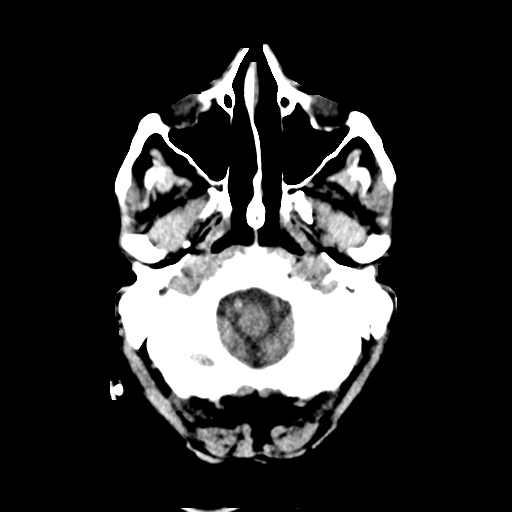
[im 3/34  bone]
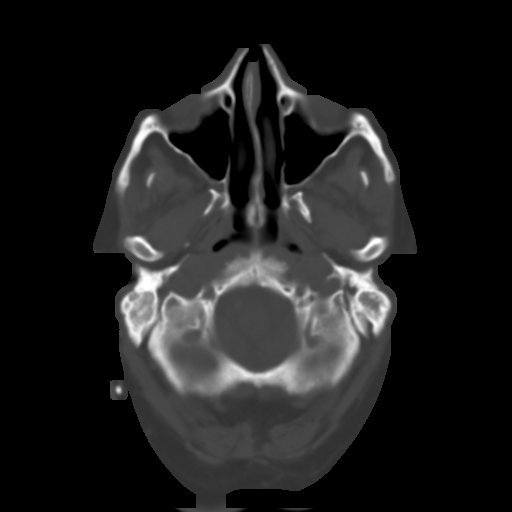
[im 6/34  brain]
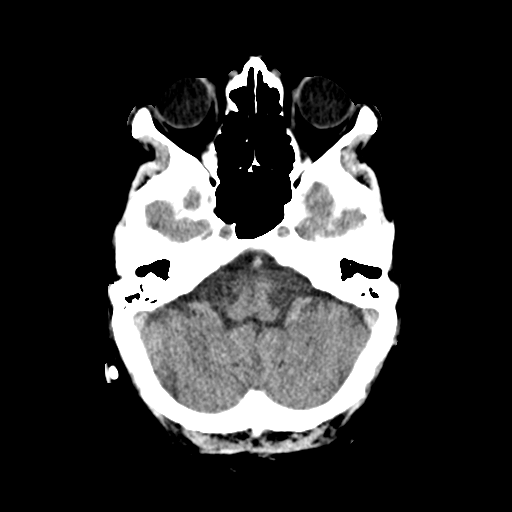
[im 10/34  brain]
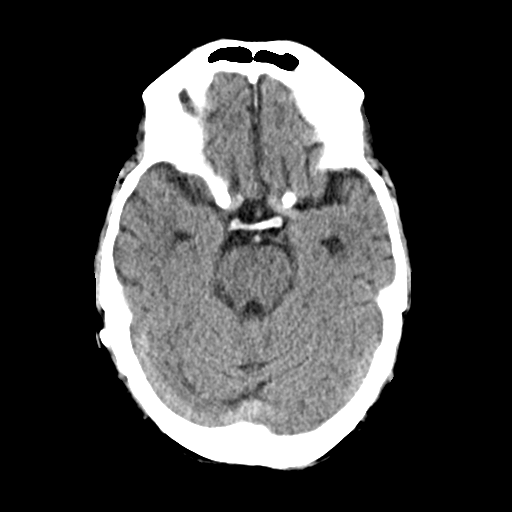
[im 12/34  brain]
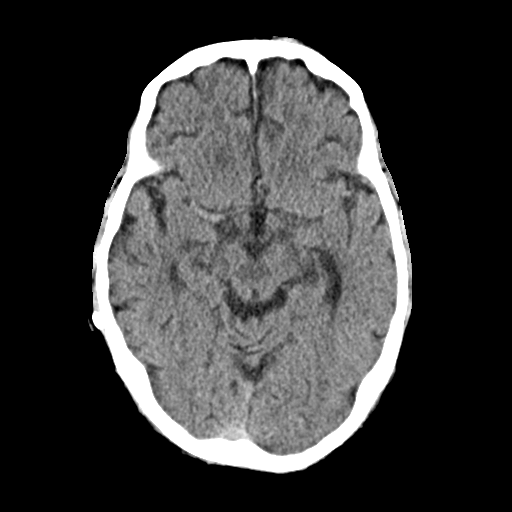
[im 15/34  brain]
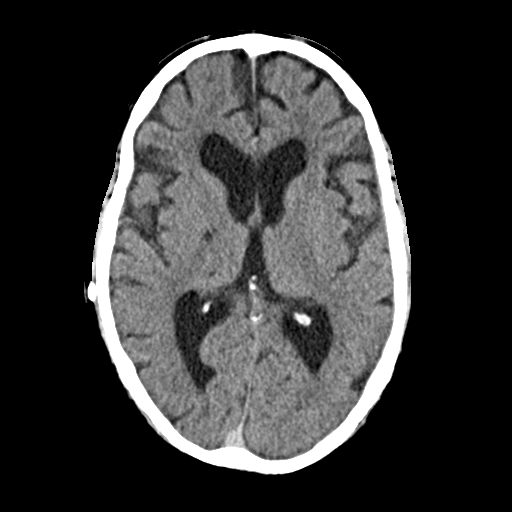
[im 15/34  bone]
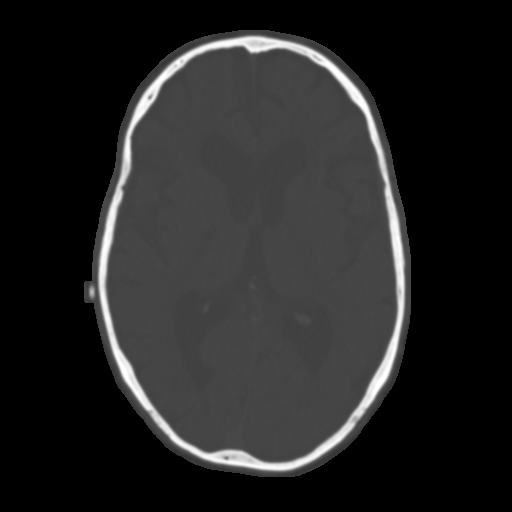
[im 19/34  brain]
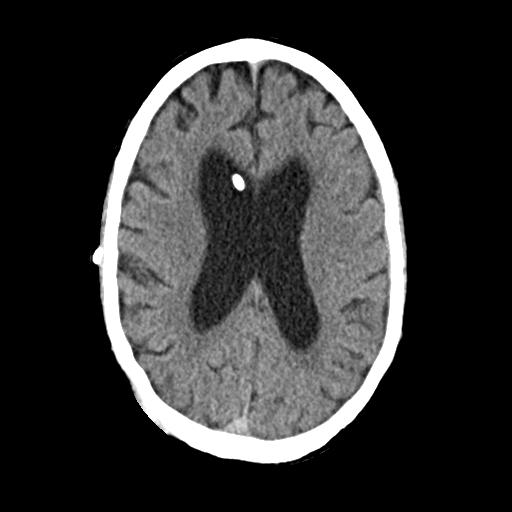
[im 22/34  brain]
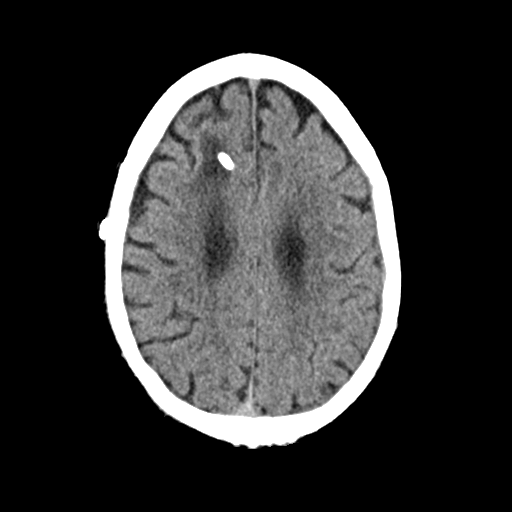
[im 26/34  brain]
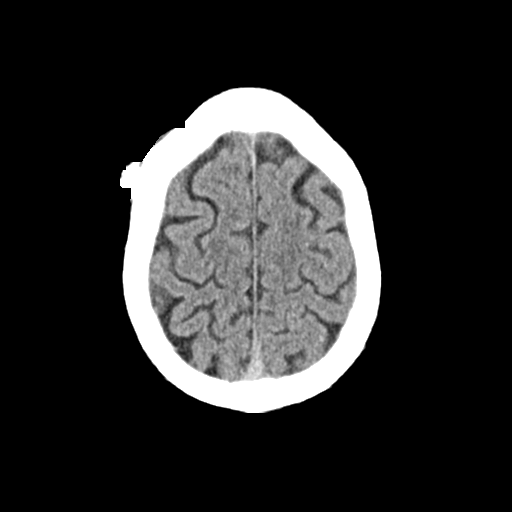
[im 28/34  brain]
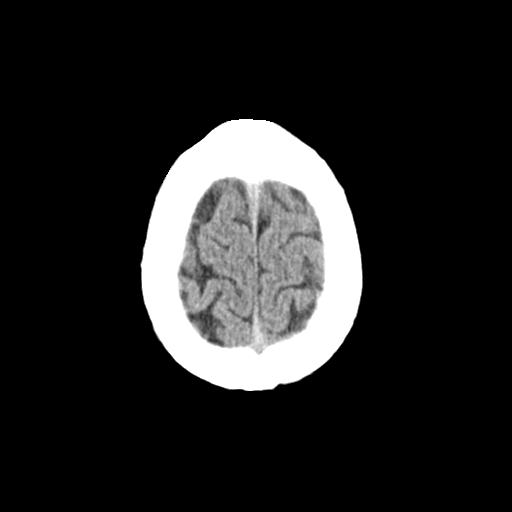
[im 28/34  bone]
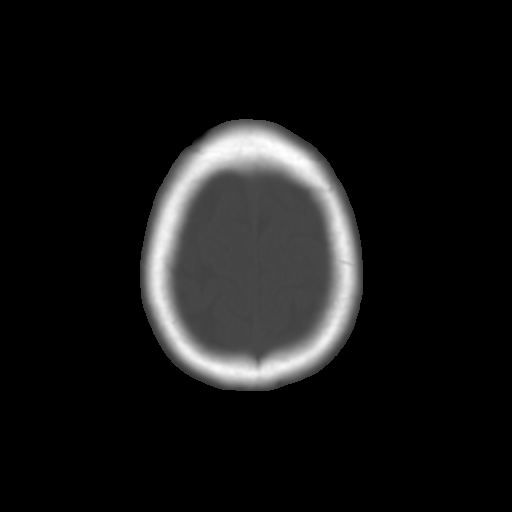
[im 31/34  brain]
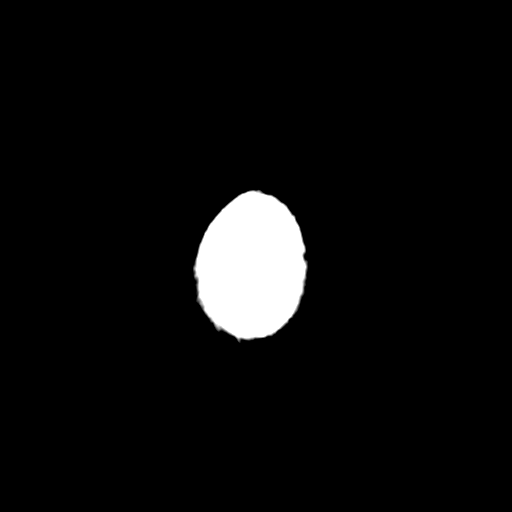

[Series 4: coronal soft · coronal · 0.33mm/px · 3 of 72 slices shown]
[im 24/72  brain]
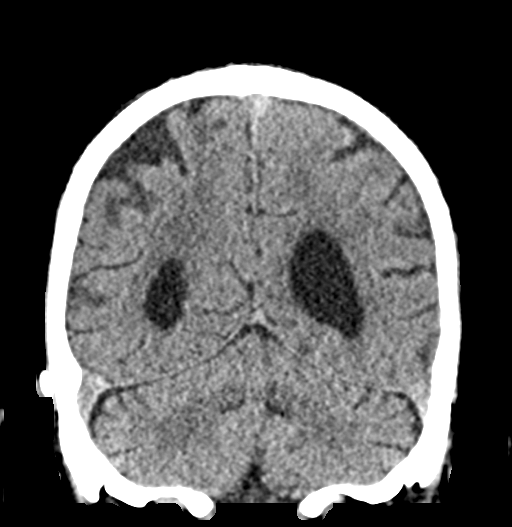
[im 32/72  brain]
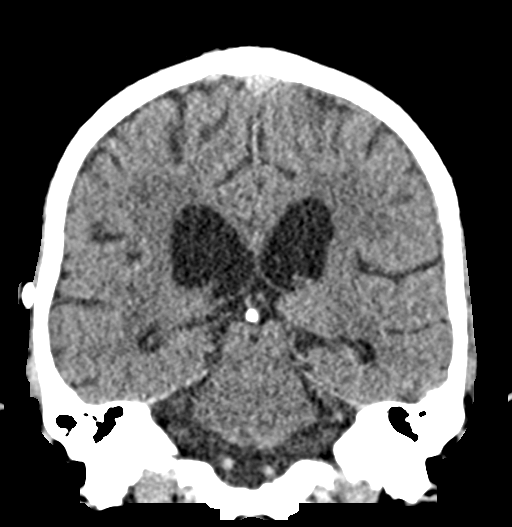
[im 40/72  brain]
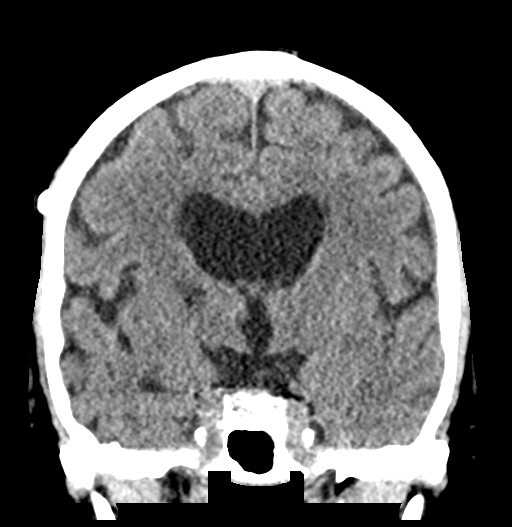

[Series 5: sag soft · sagittal · 0.35mm/px · 3 of 55 slices shown]
[im 19/55  brain]
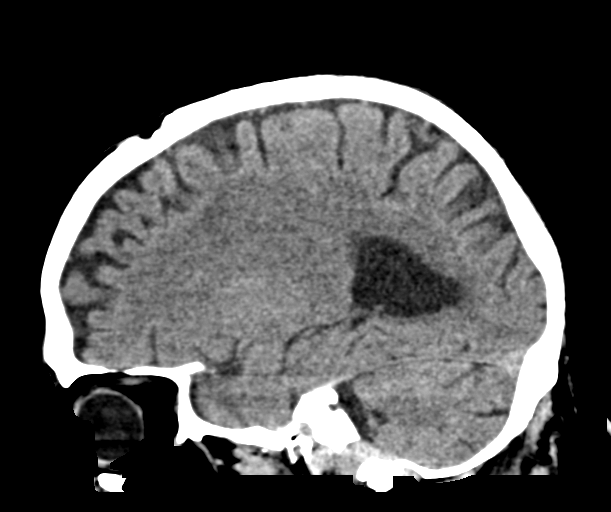
[im 28/55  brain]
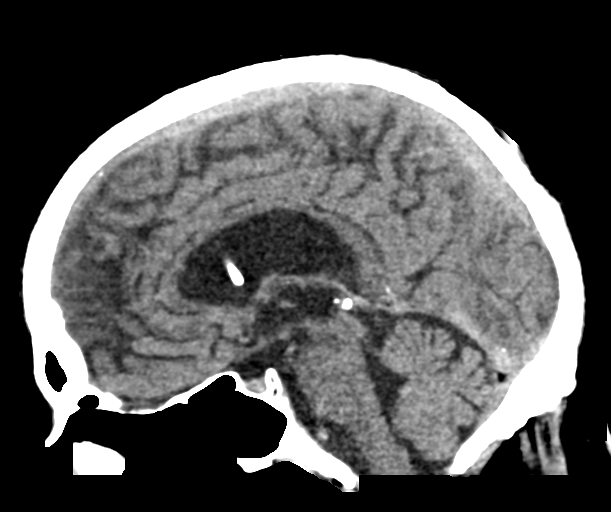
[im 37/55  brain]
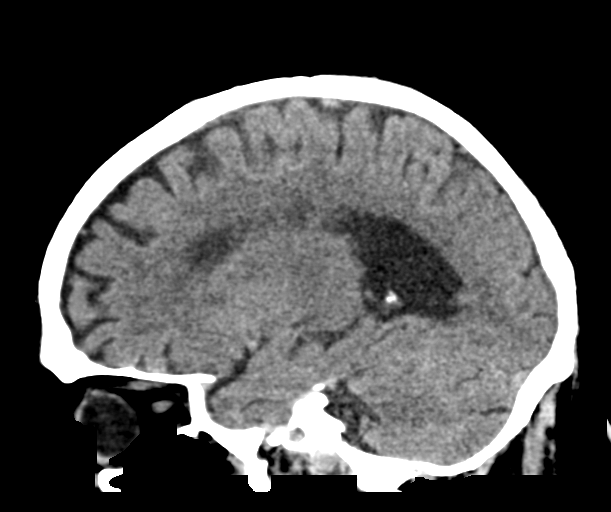

[16 of 47 positions shown; findings below may reference images not displayed]

FINDINGS: Brain: No focal abnormality affects the brainstem or cerebellum.
Cerebral hemispheres show old small vessel infarctions in the right
basal ganglia and internal capsule which were not present in [DATE]. There is a right frontal VP shunt with typical surrounding
gliosis. Shunt tip is in the frontal horn of the right lateral
ventricle. Ventricular size appears similar to the MRI last year. No
sign of acute infarction, mass lesion or intraparenchymal
hemorrhage. The patient has a tiny subdural hematoma along the left
frontal convexity, no more than 1-2 mm in thickness.

Vascular: No abnormal vascular finding.

Skull: Otherwise negative

Sinuses/Orbits: Clear/normal

Other: None
IMPRESSION: VP shunt in place from a right frontal approach. Ventricular size is
stable compared to the studies of last year.

1-2 mm thick subdural hematoma along the left frontal lateral
convexity without mass-effect.

Old appearing small vessel infarctions in the right basal ganglia
and internal capsule, not present in [DATE].

## 2021-01-19 NOTE — Pre-Procedure Instructions (Signed)
Surgical Instructions    Your procedure is scheduled on Monday 01/23/21.   Report to [facility] Main Entrance "A" at 08:30 A.M., then check in with the Admitting office.  Call this number if you have problems the morning of surgery:  [phone]   If you have any questions prior to your surgery date call [phone]: Open Monday-Friday 8am-4pm    Remember:  Do not eat after midnight the night before your surgery  You may drink clear liquids until 07:30 A.M. the morning of your surgery.   Clear liquids allowed are: Water, Non-Citrus Juices (without pulp), Carbonated Beverages, Clear Tea, Black Coffee ONLY (NO MILK, CREAM OR POWDERED CREAMER of any kind), and Gatorade    Take these medicines the morning of surgery with A SIP OF WATER   rosuvastatin (CRESTOR)  lamoTRIgine (LAMICTAL) hydroxypropyl methylcellulose (ISOPTO TEARS / GONIOVISC)- If needed   Please follow your surgeon's instructions regarding Aspirin. If you have not received instructions then please contact your surgeon's office for instructions.   As of today, STOP taking any Aspirin (unless otherwise instructed by your surgeon) Aleve, Naproxen, Ibuprofen, Motrin, Advil, Goody's, BC's, all herbal medications, fish oil, and all vitamins.   WHAT DO I DO ABOUT MY DIABETES MEDICATION?   Do not take oral diabetes medicines (pills) the morning of surgery.  DO NOT TAKE glipiZIDE (GLUCOTROL XL) the evening before surgery 01/22/21 or the morning of surgery 01/23/21.  DO NOT TAKE canagliflozin (INVOKANA) the day before surgery 01/22/21 or the morning of surgery 01/23/21.   The day of surgery, do not take other diabetes injectables, including Byetta (exenatide), Bydureon (exenatide ER), Victoza (liraglutide), or Trulicity (dulaglutide).  HOW TO MANAGE YOUR DIABETES BEFORE AND AFTER SURGERY  Why is it important to control my blood sugar before and after surgery? Improving blood sugar levels before and after surgery helps  healing and can limit problems. A way of improving blood sugar control is eating a healthy diet by:  Eating less sugar and carbohydrates  Increasing activity/exercise  Talking with your doctor about reaching your blood sugar goals High blood sugars (greater than 180 mg/dL) can raise your risk of infections and slow your recovery, so you will need to focus on controlling your diabetes during the weeks before surgery. Make sure that the doctor who takes care of your diabetes knows about your planned surgery including the date and location.  How do I manage my blood sugar before surgery? Check your blood sugar at least 4 times a day, starting 2 days before surgery, to make sure that the level is not too high or low.  Check your blood sugar the morning of your surgery when you wake up and every 2 hours until you get to the Short Stay unit.  If your blood sugar is less than 70 mg/dL, you will need to treat for low blood sugar: Do not take insulin. Treat a low blood sugar (less than 70 mg/dL) with  cup of clear juice (cranberry or apple), 4 glucose tablets, OR glucose gel. Recheck blood sugar in 15 minutes after treatment (to make sure it is greater than 70 mg/dL). If your blood sugar is not greater than 70 mg/dL on recheck, call [phone] for further instructions. Report your blood sugar to the short stay nurse when you get to Short Stay.  If you are admitted to the hospital after surgery: Your blood sugar will be checked by the staff and you will probably be given insulin after surgery (instead of oral diabetes  medicines) to make sure you have good blood sugar levels. The goal for blood sugar control after surgery is 80-180 mg/dL.   After your COVID test   You are not required to quarantine however you are required to wear a well-fitting mask when you are out and around people not in your household.  If your mask becomes wet or soiled, replace with a new one.  Wash your hands often with  soap and water for 20 seconds or clean your hands with an alcohol-based hand sanitizer that contains at least 60% alcohol.  Do not share personal items.  Notify your provider: if you are in close contact with someone who has COVID  or if you develop a fever of 100.4 or greater, sneezing, cough, sore throat, shortness of breath or body aches.             Do not wear jewelry or makeup Do not wear lotions, powders, perfumes/colognes, or deodorant. Do not shave 48 hours prior to surgery.  Men may shave face and neck. Do not bring valuables to the hospital. DO Not wear nail polish, gel polish, artificial nails, or any other type of covering on natural nails including finger and toenails. If patients have artificial nails, gel coating, etc. that need to be removed by a nail salon, please have this removed prior to surgery or surgery may need to be canceled/delayed if the surgeon/ anesthesia feels like the patient is unable to be adequately monitored.             [facility] is not responsible for any belongings or valuables.  Do NOT Smoke (Tobacco/Vaping)  24 hours prior to your procedure  If you use a CPAP at night, you may bring your mask for your overnight stay.   Contacts, glasses, hearing aids, dentures or partials may not be worn into surgery, please bring cases for these belongings   For patients admitted to the hospital, discharge time will be determined by your treatment team.   Patients discharged the day of surgery will not be allowed to drive home, and someone needs to stay with them for 24 hours.  NO VISITORS WILL BE ALLOWED IN PRE-OP WHERE PATIENTS ARE PREPPED FOR SURGERY.  ONLY 1 SUPPORT PERSON MAY BE PRESENT IN THE WAITING ROOM WHILE YOU ARE IN SURGERY.  IF YOU ARE TO BE ADMITTED, ONCE YOU ARE IN YOUR ROOM YOU WILL BE ALLOWED TWO (2) VISITORS. 1 (ONE) VISITOR MAY STAY OVERNIGHT BUT MUST ARRIVE TO THE ROOM BY 8pm.  Minor children may have two parents present. Special  consideration for safety and communication needs will be reviewed on a case by case basis.  Special instructions:    Oral Hygiene is also important to reduce your risk of infection.  Remember - BRUSH YOUR TEETH THE MORNING OF SURGERY WITH YOUR REGULAR TOOTHPASTE   [facility]- Preparing For Surgery  Before surgery, you can play an important role. Because skin is not sterile, your skin needs to be as free of germs as possible. You can reduce the number of germs on your skin by washing with CHG (chlorahexidine gluconate) Soap before surgery.  CHG is an antiseptic cleaner which kills germs and bonds with the skin to continue killing germs even after washing.     Please do not use if you have an allergy to CHG or antibacterial soaps. If your skin becomes reddened/irritated stop using the CHG.  Do not shave (including legs and underarms) for at least 48 hours  prior to first CHG shower. It is OK to shave your face.  Please follow these instructions carefully.     Shower the NIGHT BEFORE SURGERY and the MORNING OF SURGERY with CHG Soap.   If you chose to wash your hair, wash your hair first as usual with your normal shampoo. After you shampoo, rinse your hair and body thoroughly to remove the shampoo.  Then [employer] and genitals (private parts) with your normal soap and rinse thoroughly to remove soap.  After that Use CHG Soap as you would any other liquid soap. You can apply CHG directly to the skin and wash gently with a scrungie or a clean washcloth.   Apply the CHG Soap to your body ONLY FROM THE NECK DOWN.  Do not use on open wounds or open sores. Avoid contact with your eyes, ears, mouth and genitals (private parts). Wash Face and genitals (private parts)  with your normal soap.   Wash thoroughly, paying special attention to the area where your surgery will be performed.  Thoroughly rinse your body with warm water from the neck down.  DO NOT shower/wash with your normal soap after using  and rinsing off the CHG Soap.  Pat yourself dry with a CLEAN TOWEL.  Wear CLEAN PAJAMAS to bed the night before surgery  Place CLEAN SHEETS on your bed the night before your surgery  DO NOT SLEEP WITH PETS.   Day of Surgery:  Take a shower with CHG soap. Wear Clean/Comfortable clothing the morning of surgery Do not apply any deodorants/lotions.   Remember to brush your teeth WITH YOUR REGULAR TOOTHPASTE.   Please read over the following fact sheets that you were given.

## 2021-01-20 ENCOUNTER — Inpatient Hospital Stay (HOSPITAL_COMMUNITY)
Admission: RE | Admit: 2021-01-20 | Discharge: 2021-01-20 | Disposition: A | Discharge status: Home / Self Care | Payer: Medicare HMO | Source: Ambulatory Visit

## 2021-01-23 ENCOUNTER — Inpatient Hospital Stay: Admit: 2021-01-23 | Payer: Medicare HMO | Admitting: Neurological Surgery

## 2021-01-23 SURGERY — SHUNT REVISION
Anesthesia: General | Laterality: Right

## 2021-01-26 ENCOUNTER — Ambulatory Visit: Payer: Medicare HMO | Admitting: Neurology

## 2021-01-31 ENCOUNTER — Encounter: Payer: Self-pay | Admitting: Family Medicine

## 2021-01-31 NOTE — Progress Notes (Signed)
[facility] [address] [place], [place] [address] [id number] [phone]  Date:  02/01/2021   Name:  [name] Frank.   DOB:  07/17/1950   MRN:  [id number]  PCP:  Darreld Mclean, MD    Chief Complaint: 3 month follow up (Concerns/ questions: concerns that wife has sent via mychart/)   History of Present Illness:  [name] Frank. is a 70 y.o. very pleasant male patient who presents with the following:  Virtual visit today for follow-up Patient location is home, my location is home.  Patient identity confirmed with 2 factors, he gives consent for virtual visit today.  The patient and myself are present on the call today, his wife is intermittently present Last visit with myself was in September - history of diabetes, atrial fib, and renal insufficiency, hypertension, CAD   He suffered a fall in May 2020, sustained a traumatic brain injury and subdural hematoma.  He was later found to have normal pressure hydrocephalus, VP shunt placed September 2020.  He did improve following treatment of his hydrocephalus He also had seizure activity about 1 year ago He is seeing Dr Zada Finders to follow-up -  they plan to check on his shunt and possibly repeat a procedure for him-his mental state seems to be declining and they wonder if his shunt is working properly He notes that he tends to be dizzy- this is getting worse He has fallen some- most recently "a little while ago," he is using a cane/ walker to avoid falls, and he is not going up the stairs  He had a CT done 11/7-this looks okay to me  Pt's wife contacted me as she is concerned that he is depressed- poor energy, not interested in things that he normally likes to be involved in.  She did not want me to mention her concerns.  [name] Frank today if he feels like he is having depression or anxiety, he states he is not Pt notes he is sleeping pretty well No seizures He notes that focusing on  things is really hard. It is hard for him to remember like he used to He is not driving right now- he was feeling impaired and like he maybe should not be driving  Covid booster Flu shot done   He does not check his blood sugars at home  Patient Active Problem List   Diagnosis Date Noted   Partial symptomatic epilepsy with complex partial seizures, intractable, with status epilepticus ([place]) 07/12/2020   Gait abnormality 11/23/2019   Complex partial status epilepticus ([place]) 08/13/2019   Uncontrolled type 2 diabetes mellitus 08/13/2019   Acute confusion 08/12/2019   Unspecified abnormalities of gait and mobility 12/16/2018   Status post ventriculo-peritoneal shunt placement 12/02/2018   Normal pressure hydrocephalus ([place]) 12/02/2018   History of subdural hematoma 11/18/2018   Urinary incontinence 11/18/2018   Memory loss 10/09/2018   Right hemiparesis ([place]) 09/04/2018   Cognitive deficit as late effect of traumatic brain injury ([place]) 09/04/2018   Bradycardia    Leukopenia    Hypoalbuminemia due to protein-calorie malnutrition (HCC)    Hydrocele    Hydrocephalus ([place]) 07/28/2018   Mild renal insufficiency 07/28/2018   Paroxysmal atrial fibrillation ([place]) 07/28/2018   Subdural hematoma 07/14/2018   Hypertension, essential 01/16/2018   Coronary artery disease involving native heart without angina pectoris 01/16/2018    Past Medical History:  Diagnosis Date   Abnormality of gait  uses walker and wheelchair   Cognitive deficit as late effect of traumatic brain injury [facility])    Coronary artery disease    cardiologist-- dr s. Jake Bathe---  04-06-2014  NSTEMI  s/p  cardiac cath DES x1 to LAD and POBA to D1   Dysrhythmia    History of non-ST elevation myocardial infarction (NSTEMI)    04-06-2013  s/p  coronary stent and PCI   Hydrocele, bilateral    Hyperlipemia    Hypertension    Lower urinary tract symptoms (LUTS)    Memory deficit    PAF (paroxysmal atrial fibrillation) ([place])  cardilogist-- dr Jake Bathe   on-set 05/ 2020 during admission for [facility],  w/ RVR,  with cardizem/ toprol, back in NSR,  f/u by cardiologist , event monitor 09-16-2018 no results in epic by per pt wife was told result ok with no afib   S/P drug eluting coronary stent placement    04-06-2014  @[facility]   DES x1 to LAD and POBA to D1   SAH (subarachnoid hemorrhage) [facility]) neurologist-  dr Krista Blue--- last head CT 10-01-2018 resolving (10-23-2018  residual deficits memory issues, abnormal gait, congnitive   w/ SDH due to unwitnessed fall, traumatic head injury---- admission 07-14-2018 , d/c'd 08-01-2018,  readmitted 3 days later with late TBI effects,  d/c'd from rehab 08-22-2018   Seizure disorder [facility])    followed by dr Krista Blue   Subdural hematoma ([place])    Type 2 diabetes mellitus ([place])    followed by pcp   Urinary incontinence    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    Past Surgical History:  Procedure Laterality Date   CATARACT EXTRACTION W/ INTRAOCULAR LENS  IMPLANT, BILATERAL  2008; 2009   CORONARY ANGIOPLASTY WITH STENT PLACEMENT  04-06-2014  @[facility]    DES x1 to LAD and POBA to D1   HYDROCELE EXCISION Bilateral 10/27/2018   Procedure: HYDROCELECTOMY ADULT;  Surgeon: [name], MD;  Location: [facility];  Service: Urology;  Laterality: Bilateral;   TONSILLECTOMY  child   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   Procedure: Right ventriculoperitoneal shunt placement;  Surgeon: Judith Part, MD;  Location: [facility];  Service: Neurosurgery;  Laterality: Right;    Social History   Tobacco Use   Smoking status: Never   Smokeless tobacco: Never  Vaping Use   Vaping Use: Never used  Substance Use Topics   Alcohol use: Not Currently    Comment: VERY RARELY   Drug use: Never    Family History  Problem Relation Age of Onset   Stroke Father    Hypertension Father    Hyperlipidemia Father    Early death Mother    Colitis Sister    Appendicitis Brother     No Known  Allergies  Medication list has been reviewed and updated.  Current Outpatient Medications on File Prior to Visit  Medication Sig Dispense Refill   aspirin 81 MG EC tablet Take 1 tablet by mouth daily.     canagliflozin (INVOKANA) 100 MG TABS tablet Take 1 tablet (100 mg total) by mouth daily before breakfast. 30 tablet 6   glipiZIDE (GLUCOTROL XL) 5 MG 24 hr tablet Take 1 tablet (5 mg total) by mouth daily. Increase to twice a day after 1 weeks assuming no symptoms of low blood sugar 180 tablet 3   hydroxypropyl methylcellulose / hypromellose (ISOPTO TEARS / GONIOVISC) 2.5 % ophthalmic solution Place 1 drop into both eyes 3 (three) times daily as needed for dry eyes.  mupirocin ointment (BACTROBAN) 2 % Apply 1 application topically 3 (three) times daily. 22 each 0   rosuvastatin (CRESTOR) 5 MG tablet Take 5 mg by mouth daily.     terbinafine (LAMISIL) 250 MG tablet Take 1 tablet (250 mg total) by mouth daily. 14 tablet 0   tolnaftate (TINACTIN) 1 % cream Apply 1 application topically daily as needed (athlete's foot).     lamoTRIgine (LAMICTAL) 100 MG tablet Take 1 tablet (100 mg total) by mouth 2 (two) times daily. 180 tablet 4   No current facility-administered medications on file prior to visit.    Review of Systems:  As per HPI- otherwise negative.   Physical Examination: There were no vitals filed for this visit. There were no vitals filed for this visit. There is no height or weight on file to calculate BMI. Ideal Body Weight:   They are not checking vital signs at home.  Patient observed via video, he looks well.  His affect is not as bright and involved as it was right after his shunt was placed  Assessment and Plan: Hypertension, essential  Uncontrolled type 2 diabetes mellitus with hyperglycemia (HCC)  Subdural hematoma  Post-traumatic hydrocephalus (HCC)  Virtual visit today to discuss conditions as above He is overdue for diabetes/A1c recheck.  We scheduled a  visit together in about 3 weeks to check his A1c and other labs We discussed his symptoms traumatic brain injury and also hydrocephalus.  Right after his injury [name] Frank was very confused and not mentating well at all.  Then, when his hydrocephalus was recognized and treated he went through a period of return to near normal.  He has regressed again, is having more difficulty with mentation and balance.  As above, his wife wonders if he is depressed.  He is actively following up with neurosurgery right now, they may do a procedure to check or perhaps flushed his shunt.  They will keep me posted about his progress here  Because patient explicitly denies depression we will not start him on medication for same  Video used for duration of visit today  Signed Abbe Amsterdam, MD

## 2021-02-01 ENCOUNTER — Telehealth (INDEPENDENT_AMBULATORY_CARE_PROVIDER_SITE_OTHER): Payer: Medicare HMO | Admitting: Family Medicine

## 2021-02-01 ENCOUNTER — Other Ambulatory Visit: Payer: Self-pay

## 2021-02-01 DIAGNOSIS — S065XAA Traumatic subdural hemorrhage with loss of consciousness status unknown, initial encounter: Secondary | ICD-10-CM | POA: Diagnosis not present

## 2021-02-01 DIAGNOSIS — W19XXXD Unspecified fall, subsequent encounter: Secondary | ICD-10-CM | POA: Diagnosis not present

## 2021-02-01 DIAGNOSIS — S065X0D Traumatic subdural hemorrhage without loss of consciousness, subsequent encounter: Secondary | ICD-10-CM

## 2021-02-01 DIAGNOSIS — E1165 Type 2 diabetes mellitus with hyperglycemia: Secondary | ICD-10-CM

## 2021-02-01 DIAGNOSIS — G913 Post-traumatic hydrocephalus, unspecified: Secondary | ICD-10-CM

## 2021-02-01 DIAGNOSIS — I10 Essential (primary) hypertension: Secondary | ICD-10-CM

## 2021-02-05 ENCOUNTER — Inpatient Hospital Stay (HOSPITAL_COMMUNITY): Payer: Medicare HMO

## 2021-02-05 ENCOUNTER — Emergency Department (HOSPITAL_COMMUNITY): Payer: Medicare HMO

## 2021-02-05 ENCOUNTER — Inpatient Hospital Stay (HOSPITAL_COMMUNITY)
Admission: EM | Admit: 2021-02-05 | Discharge: 2021-03-12 | DRG: 064 | Disposition: E | Payer: Medicare HMO | Attending: Pulmonary Disease | Admitting: Pulmonary Disease

## 2021-02-05 ENCOUNTER — Encounter (HOSPITAL_COMMUNITY): Payer: Self-pay | Admitting: Emergency Medicine

## 2021-02-05 DIAGNOSIS — G40909 Epilepsy, unspecified, not intractable, without status epilepticus: Secondary | ICD-10-CM | POA: Diagnosis present

## 2021-02-05 DIAGNOSIS — Z4682 Encounter for fitting and adjustment of non-vascular catheter: Secondary | ICD-10-CM | POA: Diagnosis not present

## 2021-02-05 DIAGNOSIS — R401 Stupor: Secondary | ICD-10-CM

## 2021-02-05 DIAGNOSIS — J9601 Acute respiratory failure with hypoxia: Secondary | ICD-10-CM | POA: Diagnosis present

## 2021-02-05 DIAGNOSIS — E669 Obesity, unspecified: Secondary | ICD-10-CM | POA: Diagnosis present

## 2021-02-05 DIAGNOSIS — Z515 Encounter for palliative care: Secondary | ICD-10-CM

## 2021-02-05 DIAGNOSIS — Z66 Do not resuscitate: Secondary | ICD-10-CM | POA: Diagnosis present

## 2021-02-05 DIAGNOSIS — Z20822 Contact with and (suspected) exposure to covid-19: Secondary | ICD-10-CM | POA: Diagnosis not present

## 2021-02-05 DIAGNOSIS — Z982 Presence of cerebrospinal fluid drainage device: Secondary | ICD-10-CM | POA: Diagnosis not present

## 2021-02-05 DIAGNOSIS — G919 Hydrocephalus, unspecified: Secondary | ICD-10-CM | POA: Diagnosis not present

## 2021-02-05 DIAGNOSIS — E872 Acidosis, unspecified: Secondary | ICD-10-CM | POA: Diagnosis not present

## 2021-02-05 DIAGNOSIS — W1811XA Fall from or off toilet without subsequent striking against object, initial encounter: Secondary | ICD-10-CM | POA: Diagnosis present

## 2021-02-05 DIAGNOSIS — J9600 Acute respiratory failure, unspecified whether with hypoxia or hypercapnia: Secondary | ICD-10-CM | POA: Diagnosis not present

## 2021-02-05 DIAGNOSIS — G912 (Idiopathic) normal pressure hydrocephalus: Secondary | ICD-10-CM | POA: Diagnosis not present

## 2021-02-05 DIAGNOSIS — I6622 Occlusion and stenosis of left posterior cerebral artery: Secondary | ICD-10-CM | POA: Diagnosis not present

## 2021-02-05 DIAGNOSIS — G8191 Hemiplegia, unspecified affecting right dominant side: Secondary | ICD-10-CM | POA: Diagnosis present

## 2021-02-05 DIAGNOSIS — Z6829 Body mass index (BMI) 29.0-29.9, adult: Secondary | ICD-10-CM

## 2021-02-05 DIAGNOSIS — E876 Hypokalemia: Secondary | ICD-10-CM | POA: Diagnosis not present

## 2021-02-05 DIAGNOSIS — E1165 Type 2 diabetes mellitus with hyperglycemia: Secondary | ICD-10-CM | POA: Diagnosis present

## 2021-02-05 DIAGNOSIS — Z79899 Other long term (current) drug therapy: Secondary | ICD-10-CM | POA: Diagnosis not present

## 2021-02-05 DIAGNOSIS — I6312 Cerebral infarction due to embolism of basilar artery: Secondary | ICD-10-CM | POA: Diagnosis not present

## 2021-02-05 DIAGNOSIS — I6349 Cerebral infarction due to embolism of other cerebral artery: Secondary | ICD-10-CM | POA: Diagnosis not present

## 2021-02-05 DIAGNOSIS — Z978 Presence of other specified devices: Secondary | ICD-10-CM | POA: Diagnosis not present

## 2021-02-05 DIAGNOSIS — Z043 Encounter for examination and observation following other accident: Secondary | ICD-10-CM | POA: Diagnosis not present

## 2021-02-05 DIAGNOSIS — S199XXA Unspecified injury of neck, initial encounter: Secondary | ICD-10-CM | POA: Diagnosis not present

## 2021-02-05 DIAGNOSIS — I700 Atherosclerosis of aorta: Secondary | ICD-10-CM | POA: Diagnosis present

## 2021-02-05 DIAGNOSIS — Z823 Family history of stroke: Secondary | ICD-10-CM

## 2021-02-05 DIAGNOSIS — W19XXXA Unspecified fall, initial encounter: Secondary | ICD-10-CM | POA: Insufficient documentation

## 2021-02-05 DIAGNOSIS — E785 Hyperlipidemia, unspecified: Secondary | ICD-10-CM | POA: Diagnosis present

## 2021-02-05 DIAGNOSIS — R404 Transient alteration of awareness: Secondary | ICD-10-CM | POA: Diagnosis not present

## 2021-02-05 DIAGNOSIS — Z7984 Long term (current) use of oral hypoglycemic drugs: Secondary | ICD-10-CM

## 2021-02-05 DIAGNOSIS — I480 Paroxysmal atrial fibrillation: Secondary | ICD-10-CM | POA: Diagnosis present

## 2021-02-05 DIAGNOSIS — R4182 Altered mental status, unspecified: Secondary | ICD-10-CM | POA: Diagnosis not present

## 2021-02-05 DIAGNOSIS — W19XXXS Unspecified fall, sequela: Secondary | ICD-10-CM | POA: Diagnosis not present

## 2021-02-05 DIAGNOSIS — I10 Essential (primary) hypertension: Secondary | ICD-10-CM | POA: Diagnosis present

## 2021-02-05 DIAGNOSIS — Z7982 Long term (current) use of aspirin: Secondary | ICD-10-CM

## 2021-02-05 DIAGNOSIS — I69319 Unspecified symptoms and signs involving cognitive functions following cerebral infarction: Secondary | ICD-10-CM | POA: Diagnosis not present

## 2021-02-05 DIAGNOSIS — I2510 Atherosclerotic heart disease of native coronary artery without angina pectoris: Secondary | ICD-10-CM | POA: Diagnosis present

## 2021-02-05 DIAGNOSIS — I639 Cerebral infarction, unspecified: Secondary | ICD-10-CM | POA: Diagnosis not present

## 2021-02-05 DIAGNOSIS — Z8249 Family history of ischemic heart disease and other diseases of the circulatory system: Secondary | ICD-10-CM

## 2021-02-05 DIAGNOSIS — Z955 Presence of coronary angioplasty implant and graft: Secondary | ICD-10-CM

## 2021-02-05 DIAGNOSIS — R402432 Glasgow coma scale score 3-8, at arrival to emergency department: Secondary | ICD-10-CM | POA: Diagnosis not present

## 2021-02-05 DIAGNOSIS — I6523 Occlusion and stenosis of bilateral carotid arteries: Secondary | ICD-10-CM | POA: Diagnosis not present

## 2021-02-05 DIAGNOSIS — G9341 Metabolic encephalopathy: Secondary | ICD-10-CM | POA: Diagnosis not present

## 2021-02-05 DIAGNOSIS — F411 Generalized anxiety disorder: Secondary | ICD-10-CM | POA: Diagnosis present

## 2021-02-05 DIAGNOSIS — Z8679 Personal history of other diseases of the circulatory system: Secondary | ICD-10-CM | POA: Diagnosis not present

## 2021-02-05 DIAGNOSIS — I6340 Cerebral infarction due to embolism of unspecified cerebral artery: Secondary | ICD-10-CM | POA: Insufficient documentation

## 2021-02-05 DIAGNOSIS — I252 Old myocardial infarction: Secondary | ICD-10-CM

## 2021-02-05 DIAGNOSIS — I6503 Occlusion and stenosis of bilateral vertebral arteries: Secondary | ICD-10-CM | POA: Diagnosis not present

## 2021-02-05 DIAGNOSIS — Z7189 Other specified counseling: Secondary | ICD-10-CM | POA: Diagnosis not present

## 2021-02-05 DIAGNOSIS — E875 Hyperkalemia: Secondary | ICD-10-CM | POA: Diagnosis not present

## 2021-02-05 DIAGNOSIS — R339 Retention of urine, unspecified: Secondary | ICD-10-CM | POA: Diagnosis present

## 2021-02-05 DIAGNOSIS — I651 Occlusion and stenosis of basilar artery: Secondary | ICD-10-CM | POA: Diagnosis not present

## 2021-02-05 DIAGNOSIS — Z9114 Patient's other noncompliance with medication regimen: Secondary | ICD-10-CM

## 2021-02-05 DIAGNOSIS — I6310 Cerebral infarction due to embolism of unspecified precerebral artery: Secondary | ICD-10-CM | POA: Diagnosis not present

## 2021-02-05 DIAGNOSIS — J9690 Respiratory failure, unspecified, unspecified whether with hypoxia or hypercapnia: Secondary | ICD-10-CM

## 2021-02-05 DIAGNOSIS — D751 Secondary polycythemia: Secondary | ICD-10-CM | POA: Diagnosis not present

## 2021-02-05 DIAGNOSIS — R509 Fever, unspecified: Secondary | ICD-10-CM

## 2021-02-05 DIAGNOSIS — J988 Other specified respiratory disorders: Secondary | ICD-10-CM | POA: Diagnosis not present

## 2021-02-05 DIAGNOSIS — S0990XA Unspecified injury of head, initial encounter: Secondary | ICD-10-CM

## 2021-02-05 DIAGNOSIS — R9431 Abnormal electrocardiogram [ECG] [EKG]: Secondary | ICD-10-CM | POA: Diagnosis not present

## 2021-02-05 LAB — CBC
HCT: 52.4 % — ABNORMAL HIGH (ref 39.0–52.0)
HCT: 50.1 % (ref 39.0–52.0)
Hemoglobin: 17 g/dL (ref 13.0–17.0)
Hemoglobin: 17.6 g/dL — ABNORMAL HIGH (ref 13.0–17.0)
MCH: 28.9 pg (ref 26.0–34.0)
MCH: 29.8 pg (ref 26.0–34.0)
MCHC: 32.4 g/dL (ref 30.0–36.0)
MCHC: 35.1 g/dL (ref 30.0–36.0)
MCV: 89 fL (ref 80.0–100.0)
MCV: 84.9 fL (ref 80.0–100.0)
Platelets: 170 10*3/uL (ref 150–400)
Platelets: 170 10*3/uL (ref 150–400)
RBC: 5.89 MIL/uL — ABNORMAL HIGH (ref 4.22–5.81)
RBC: 5.9 MIL/uL — ABNORMAL HIGH (ref 4.22–5.81)
RDW: 13.1 % (ref 11.5–15.5)
RDW: 13.1 % (ref 11.5–15.5)
WBC: 7.1 10*3/uL (ref 4.0–10.5)
WBC: 6.9 10*3/uL (ref 4.0–10.5)
nRBC: 0 % (ref 0.0–0.2)
nRBC: 0 % (ref 0.0–0.2)

## 2021-02-05 LAB — BASIC METABOLIC PANEL
Anion gap: 11 (ref 5–15)
BUN: 13 mg/dL (ref 8–23)
CO2: 23 mmol/L (ref 22–32)
Calcium: 8.8 mg/dL — ABNORMAL LOW (ref 8.9–10.3)
Chloride: 100 mmol/L (ref 98–111)
Creatinine, Ser: 0.96 mg/dL (ref 0.61–1.24)
GFR, Estimated: >60 mL/min (ref >60)
Glucose, Bld: 203 mg/dL — ABNORMAL HIGH (ref 70–99)
Potassium: 3.8 mmol/L (ref 3.5–5.1)
Sodium: 134 mmol/L — ABNORMAL LOW (ref 135–145)

## 2021-02-05 LAB — URINALYSIS, ROUTINE W REFLEX MICROSCOPIC
Bacteria, UA: NONE SEEN
Bilirubin Urine: NEGATIVE
Glucose, UA: >=500 mg/dL — AB
Ketones, ur: 5 mg/dL — AB
Leukocytes,Ua: NEGATIVE
Nitrite: NEGATIVE
Protein, ur: NEGATIVE mg/dL
Specific Gravity, Urine: 1.015 (ref 1.005–1.030)
pH: 5 (ref 5.0–8.0)

## 2021-02-05 LAB — ETHANOL: Alcohol, Ethyl (B): <10 mg/dL (ref <10)

## 2021-02-05 LAB — I-STAT ARTERIAL BLOOD GAS, ED
Acid-Base Excess: 1 mmol/L (ref 0.0–2.0)
Bicarbonate: 24.2 mmol/L (ref 20.0–28.0)
Calcium, Ion: 1.2 mmol/L (ref 1.15–1.40)
HCT: 48 % (ref 39.0–52.0)
Hemoglobin: 16.3 g/dL (ref 13.0–17.0)
O2 Saturation: 100 %
Patient temperature: 97.5
Potassium: 4.1 mmol/L (ref 3.5–5.1)
Sodium: 137 mmol/L (ref 135–145)
TCO2: 25 mmol/L (ref 22–32)
pCO2 arterial: 34.6 mmHg (ref 32.0–48.0)
pH, Arterial: 7.45 (ref 7.350–7.450)
pO2, Arterial: 331 mmHg — ABNORMAL HIGH (ref 83.0–108.0)

## 2021-02-05 LAB — I-STAT CHEM 8, ED
BUN: 20 mg/dL (ref 8–23)
Calcium, Ion: 1.04 mmol/L — ABNORMAL LOW (ref 1.15–1.40)
Chloride: 102 mmol/L (ref 98–111)
Creatinine, Ser: 0.8 mg/dL (ref 0.61–1.24)
Glucose, Bld: 202 mg/dL — ABNORMAL HIGH (ref 70–99)
HCT: 52 % (ref 39.0–52.0)
Hemoglobin: 17.7 g/dL — ABNORMAL HIGH (ref 13.0–17.0)
Potassium: 6.4 mmol/L (ref 3.5–5.1)
Sodium: 135 mmol/L (ref 135–145)
TCO2: 26 mmol/L (ref 22–32)

## 2021-02-05 LAB — COMPREHENSIVE METABOLIC PANEL
ALT: 17 U/L (ref 0–44)
AST: 29 U/L (ref 15–41)
Albumin: 3.8 g/dL (ref 3.5–5.0)
Alkaline Phosphatase: 56 U/L (ref 38–126)
Anion gap: 13 (ref 5–15)
BUN: 14 mg/dL (ref 8–23)
CO2: 21 mmol/L — ABNORMAL LOW (ref 22–32)
Calcium: 8.9 mg/dL (ref 8.9–10.3)
Chloride: 101 mmol/L (ref 98–111)
Creatinine, Ser: 0.97 mg/dL (ref 0.61–1.24)
GFR, Estimated: >60 mL/min (ref >60)
Glucose, Bld: 206 mg/dL — ABNORMAL HIGH (ref 70–99)
Potassium: 4.6 mmol/L (ref 3.5–5.1)
Sodium: 135 mmol/L (ref 135–145)
Total Bilirubin: 1.1 mg/dL (ref 0.3–1.2)
Total Protein: 6.3 g/dL — ABNORMAL LOW (ref 6.5–8.1)

## 2021-02-05 LAB — COOXEMETRY PANEL
Carboxyhemoglobin: 1.1 % (ref 0.5–1.5)
Methemoglobin: 0.9 % (ref 0.0–1.5)
O2 Saturation: 58.8 %
Total hemoglobin: 18.2 g/dL — ABNORMAL HIGH (ref 12.0–16.0)

## 2021-02-05 LAB — RAPID URINE DRUG SCREEN, HOSP PERFORMED
Amphetamines: NOT DETECTED
Barbiturates: NOT DETECTED
Benzodiazepines: NOT DETECTED
Cocaine: NOT DETECTED
Opiates: NOT DETECTED
Tetrahydrocannabinol: NOT DETECTED

## 2021-02-05 LAB — GLUCOSE, CAPILLARY
Glucose-Capillary: 220 mg/dL — ABNORMAL HIGH (ref 70–99)
Glucose-Capillary: 192 mg/dL — ABNORMAL HIGH (ref 70–99)

## 2021-02-05 LAB — RESP PANEL BY RT-PCR (FLU A&B, COVID) ARPGX2
Influenza A by PCR: NEGATIVE
Influenza B by PCR: NEGATIVE
SARS Coronavirus 2 by RT PCR: NEGATIVE

## 2021-02-05 LAB — PROTIME-INR
INR: 1 (ref 0.8–1.2)
Prothrombin Time: 12.9 seconds (ref 11.4–15.2)

## 2021-02-05 LAB — SAMPLE TO BLOOD BANK

## 2021-02-05 LAB — HIV ANTIBODY (ROUTINE TESTING W REFLEX): HIV Screen 4th Generation wRfx: NONREACTIVE

## 2021-02-05 LAB — LACTIC ACID, PLASMA: Lactic Acid, Venous: 2.4 mmol/L (ref 0.5–1.9)

## 2021-02-05 LAB — MAGNESIUM: Magnesium: 2.2 mg/dL (ref 1.7–2.4)

## 2021-02-05 IMAGING — CT CT ANGIO HEAD-NECK (W OR W/O PERF)
1 of 8 series · 14 of 47 positions shown · IV contrast (omnipaque)
Comparison: CTA [DATE], correlation is also made with same day
CT head.

CLINICAL DATA: "Basilar thrombosis"

EXAM:
CT ANGIOGRAPHY HEAD AND NECK
TECHNIQUE: Multidetector CT imaging of the head and neck was performed using
the standard protocol during bolus administration of intravenous
contrast. Multiplanar CT image reconstructions and MIPs were
obtained to evaluate the vascular anatomy. Carotid stenosis
measurements (when applicable) are obtained utilizing NASCET
criteria, using the distal internal carotid diameter as the
denominator.
CONTRAST:  150mL OMNIPAQUE IOHEXOL 350 MG/ML SOLN

[Series 7: thin · axial · 0.68mm/px · z∈[-586,-274]mm · 14 of 722 slices shown]
[im 49/722  brain]
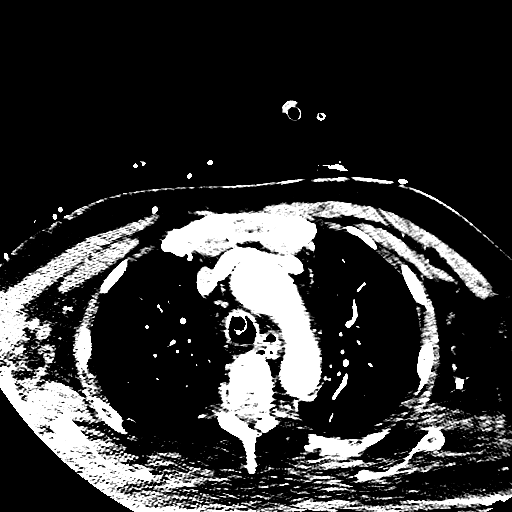
[im 97/722  bone]
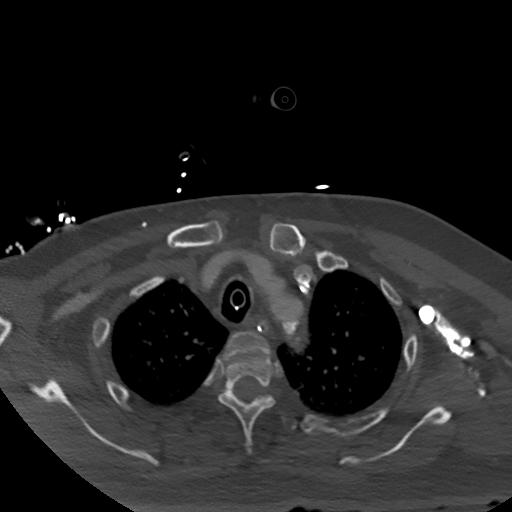
[im 145/722  brain]
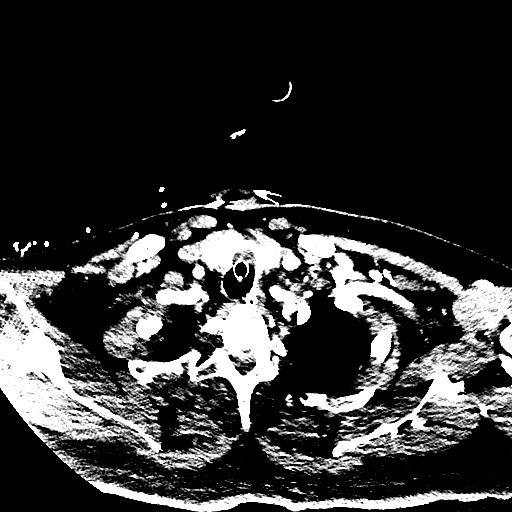
[im 193/722  bone]
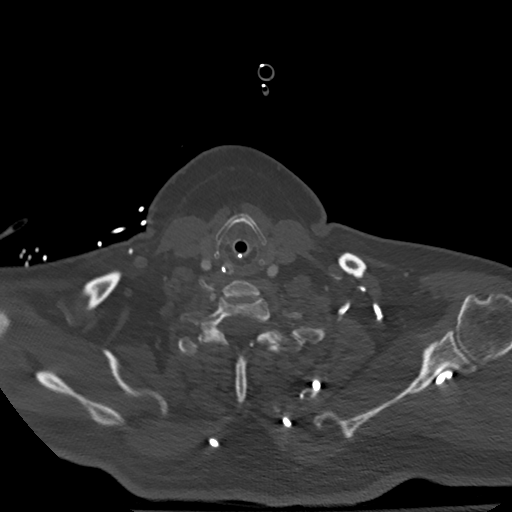
[im 241/722  brain]
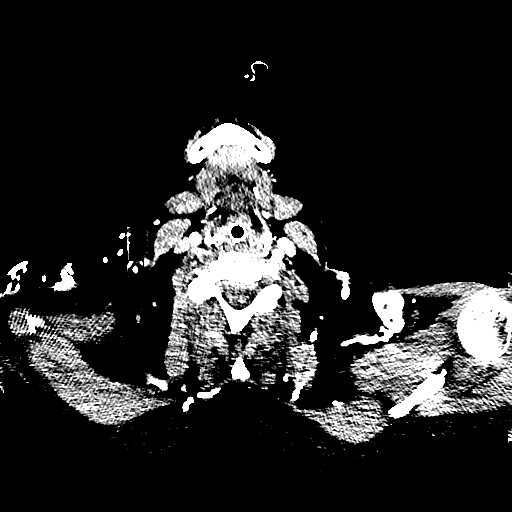
[im 289/722  bone]
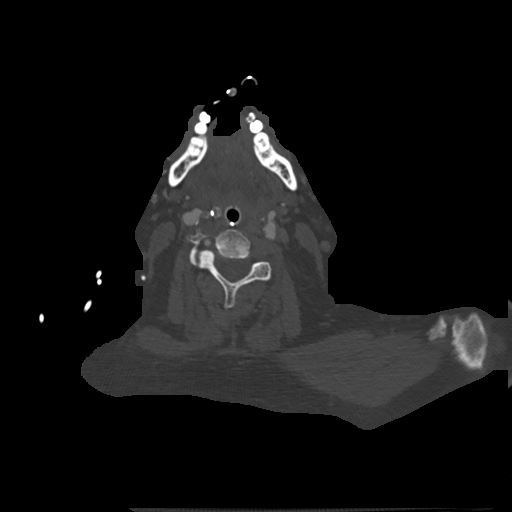
[im 337/722  brain]
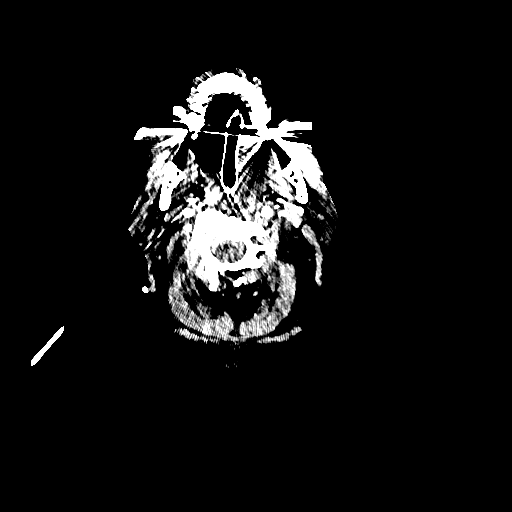
[im 385/722  bone]
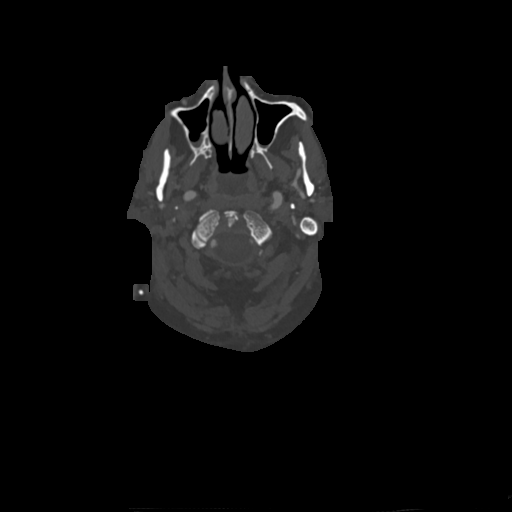
[im 433/722  brain]
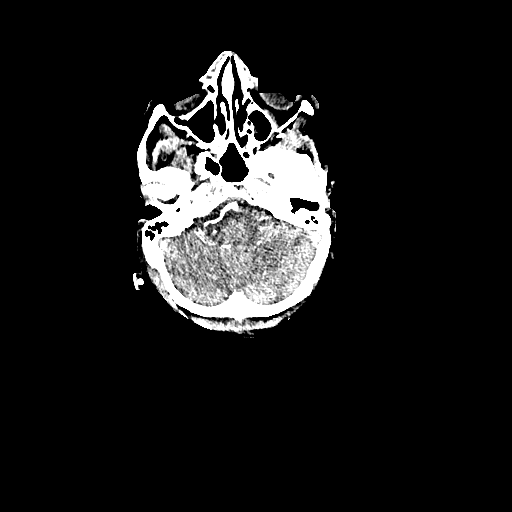
[im 481/722  bone]
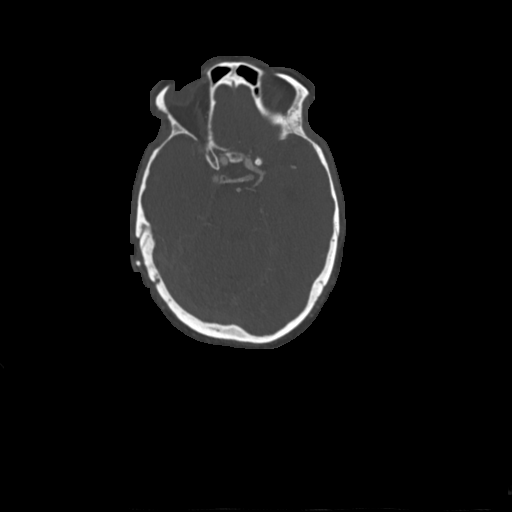
[im 529/722  brain]
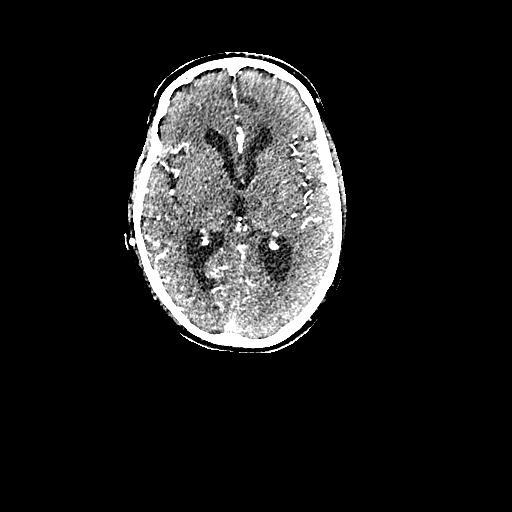
[im 577/722  bone]
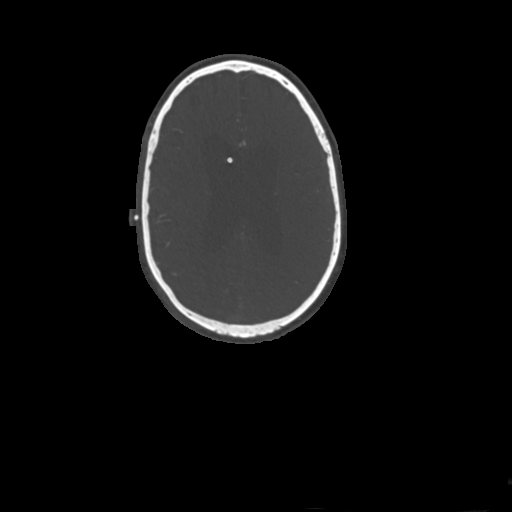
[im 625/722  brain]
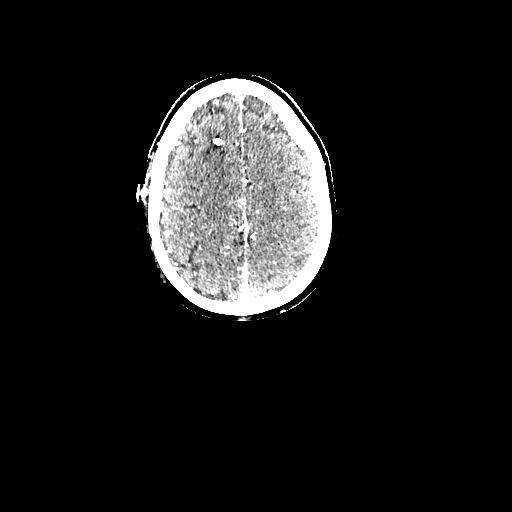
[im 673/722  bone]
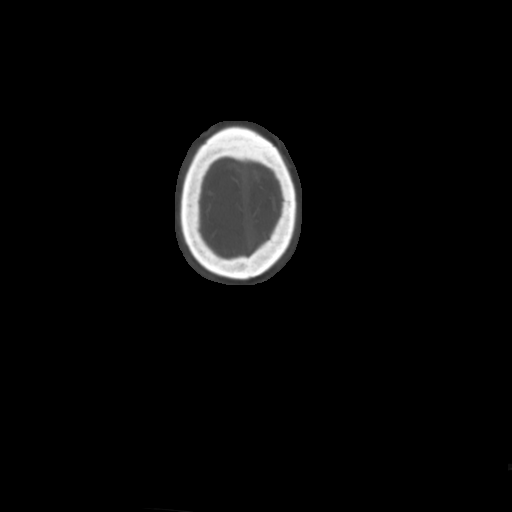

[14 of 47 positions shown; findings below may reference images not displayed]

FINDINGS: CT HEAD FINDINGS

For noncontrast findings, please see same day CT head.

CTA NECK FINDINGS

Aortic arch: Standard branching. Imaged portion shows no evidence of
aneurysm or dissection. No significant stenosis of the major arch
vessel origins. Aortic atherosclerosis.

Right carotid system: No evidence of dissection, stenosis (50% or
greater) or occlusion.

Left carotid system: No evidence of dissection, stenosis (50% or
greater) or occlusion.

Vertebral arteries: Focal narrowing just distal to the takeoff of
the right vertebral artery (series 7, image 575), unchanged. The
right vertebral artery is otherwise patent. Unchanged from the prior
exam, the left vertebral artery is occluded in the V1 and V2
segments, with reconstitution of irregular flow in the distal V3 and
proximal V4, likely retrograde.

Skeleton: No acute osseous abnormality.

Other neck: Endotracheal tube and orogastric tube noted. Otherwise
negative.

Upper chest: No focal pulmonary opacity or pleural effusion. Debris
in the trachea and right mainstem bronchus.

Review of the MIP images confirms the above findings

CTA HEAD FINDINGS

Anterior circulation: Both internal carotid arteries are patent to
the termini, without stenosis or other abnormality. A1 segments
patent. The anterior communicating artery is not visualized.
Anterior cerebral arteries are noted at patent to their distal
aspects. No M1 stenosis or occlusion. Normal MCA bifurcations. Focal
stenosis in a right M2 branch (series 7, images 230), which is new
from the prior exam. MCA branches otherwise perfused and symmetric.

Posterior circulation: Reconstitution of flow in the distal left V3
and V4 segments, overall unchanged. The right vertebral artery is
patent to the vertebrobasilar junction. Posterior inferior cerebral
arteries patent bilaterally. Basilar is irregular but patent to its
distal aspect. Superior cerebellar arteries patent bilaterally. Near
fetal origin of the left PCA, with focal narrowing of the distal
left posterior communicating artery proximal to the P1-P2 junction
(series 7, image 241), which is new from the prior exam.
Redemonstrated focal stenosis in the left P2 segment (series 7,
image 235). The left PCA is otherwise perfused although it is
irregular. Normal right PCA. The right posterior communicating
artery is not visualized.

Venous sinuses: As permitted by contrast timing, patent.

Anatomic variants: None significant

Review of the MIP images confirms the above findings
IMPRESSION: 1. New focal stenosis in a right M2 branch, with otherwise patent
anterior circulation.
2. Focal narrowing of the distal left posterior communicating
artery, which is new from prior exam, with redemonstrated focal
stenosis in the left P2 segment. The remainder of the left PCA is
perfused but irregular.
3. Unchanged left vertebral artery occlusion in the V1 and V2
segments, with irregular flow in the distal V3 and proximal V4
segments, likely secondary to retrograde flow.
4. Debris in the trachea and right mainstem bronchus.
5. Redemonstrated basilar irregularity, with no evidence of basilar
thrombosis.

## 2021-02-05 IMAGING — DX DG CHEST 1V PORT
3 series · 3 of 3 positions shown · non-contrast
Comparison: Chest x-ray [DATE].

CLINICAL DATA: Post intubation.

EXAM:
PORTABLE CHEST 1 VIEW, portable two views abdomen.

[chest ap]
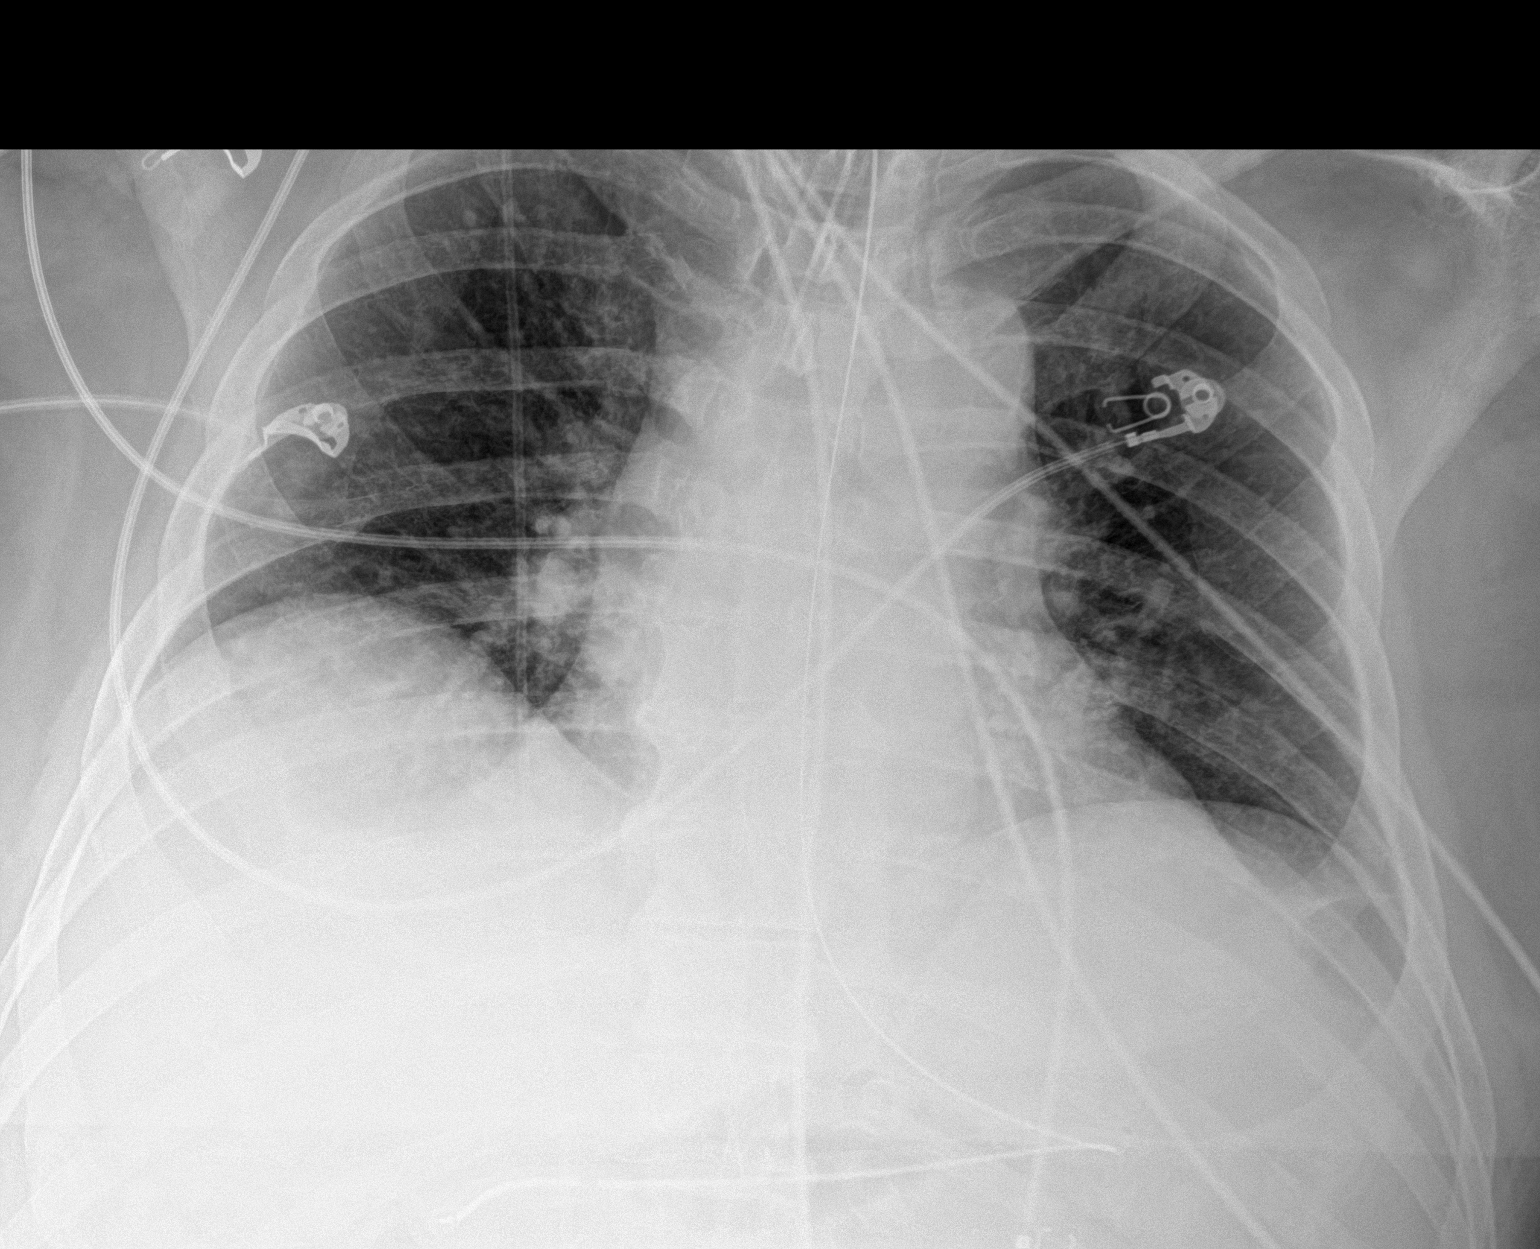

[abdomen kub (1 of 2)]
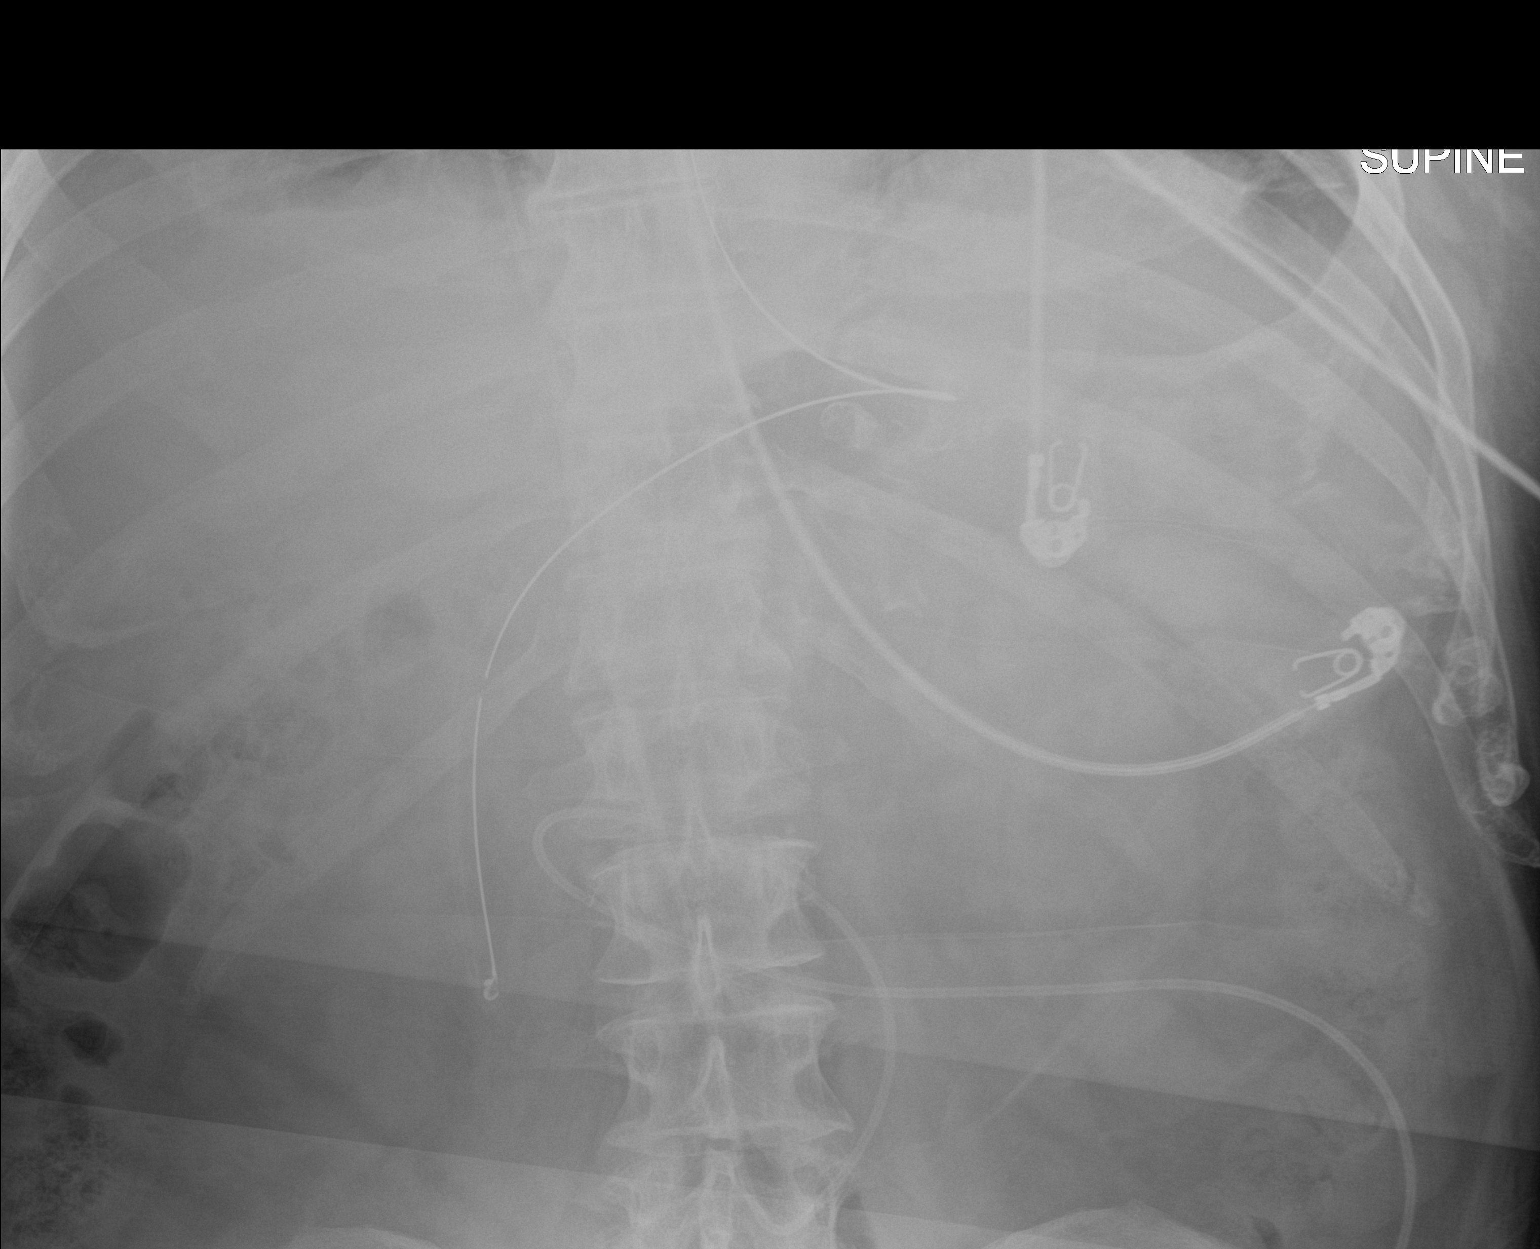

[abdomen kub (2 of 2)]
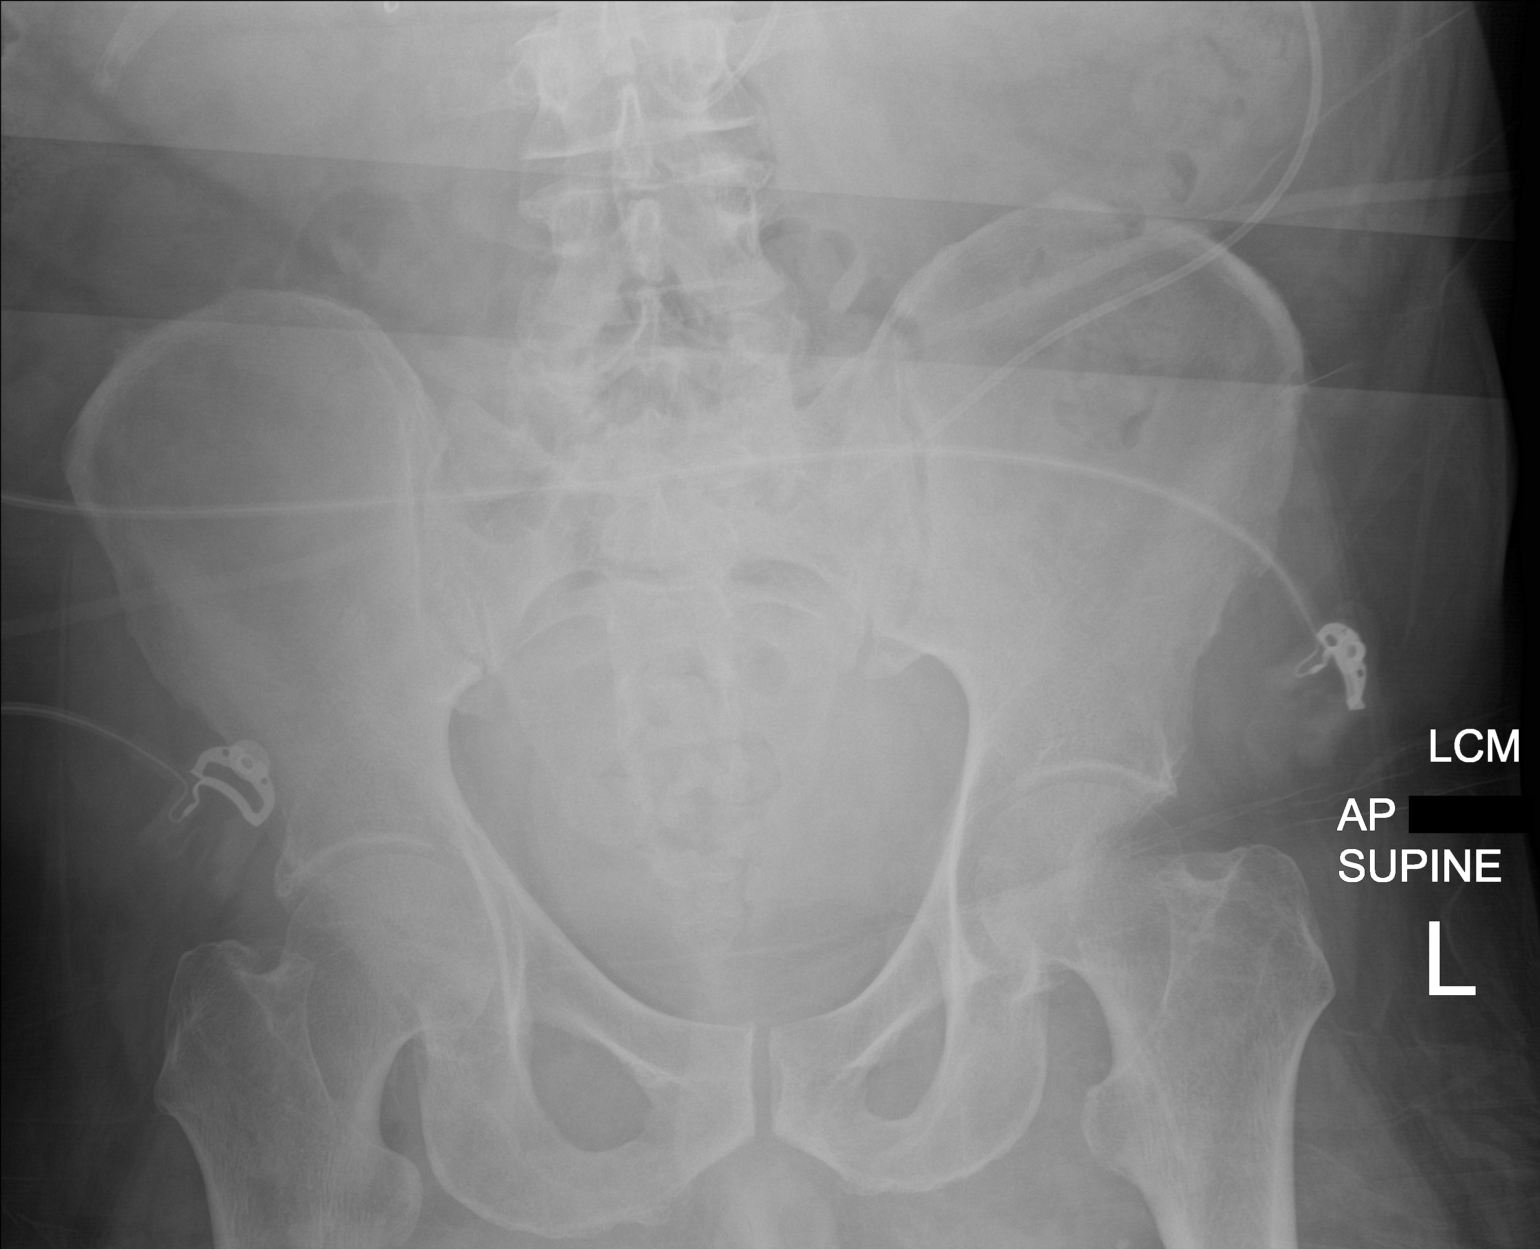

[3 of 3 positions shown; findings below may reference images not displayed]

FINDINGS: Endotracheal tube tip is 2.5 cm above the carina. Enteric tube tip
is at the level of the proximal duodenum. VP shunt overlies the
right chest and abdomen. Distal tip is in the left lower quadrant.
Visualized portion of the catheter appears intact.

The lungs are clear. The cardiomediastinal silhouette is within
normal limits. There is no pleural effusion or pneumothorax. Small
calcified nodule seen in the right upper lobe.

Bowel-gas pattern is normal. There are no suspicious calcifications.

No acute fractures are seen.
IMPRESSION: 1. Lines and tubes as above.
2. Lungs are clear.
3. Normal bowel-gas pattern.

## 2021-02-05 IMAGING — DX DG PORTABLE PELVIS
1 series · 1 of 1 positions shown · non-contrast
Comparison: Pelvis x-ray [DATE].

CLINICAL DATA: Shunt, fall.

EXAM:
PORTABLE PELVIS 1-2 VIEWS

[pelvis ap]
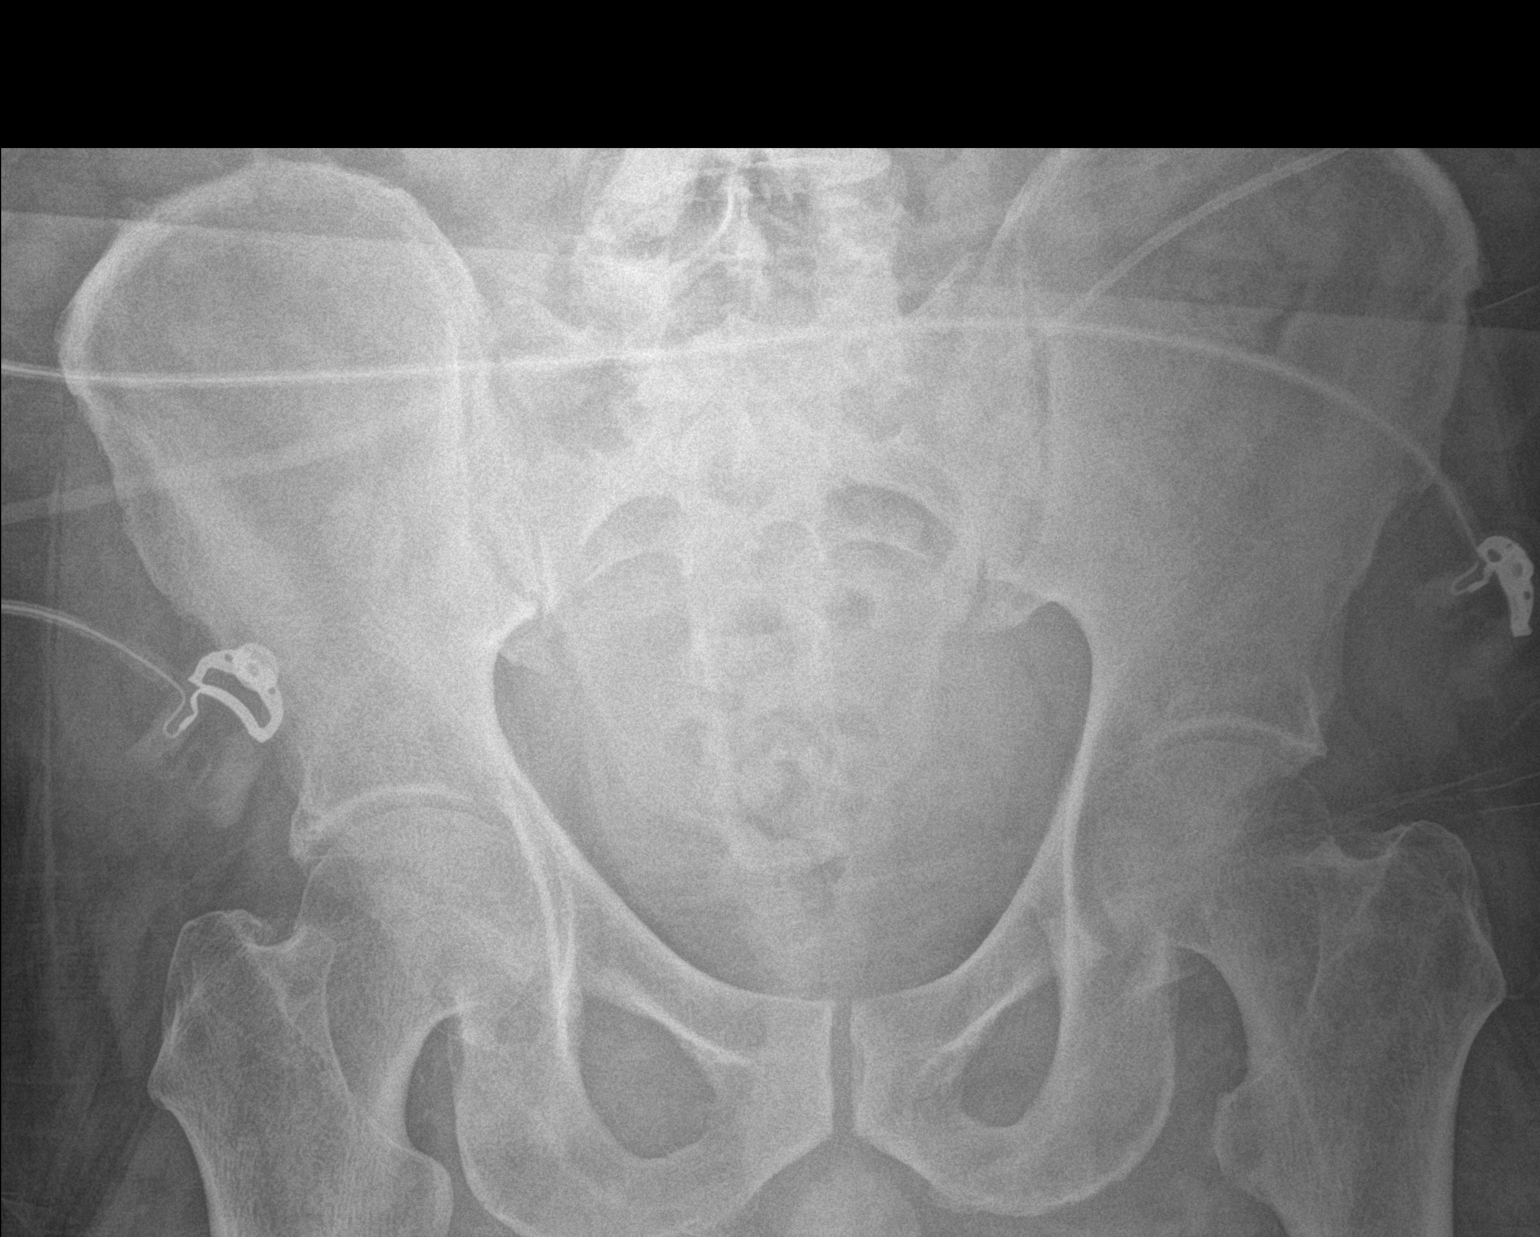

[1 of 1 positions shown; findings below may reference images not displayed]

FINDINGS: There is no evidence of pelvic fracture or diastasis. No pelvic bone
lesions are seen.
IMPRESSION: Negative.

## 2021-02-05 IMAGING — CT CT HEAD W/O CM
4 series · 16 of 47 positions shown, 18 images · non-contrast
Comparison: CT head [DATE].  MRI brain [DATE].

CLINICAL DATA: Trauma.

EXAM:
CT HEAD WITHOUT CONTRAST
CT CERVICAL SPINE WITHOUT CONTRAST
TECHNIQUE: Multidetector CT imaging of the head and cervical spine was
performed following the standard protocol without intravenous
contrast. Multiplanar CT image reconstructions of the cervical spine
were also generated.

[Series 4: head without · axial · non-contrast · 0.46mm/px · z∈[-70,+65]mm · 7 of 37 slices shown, 9 images]
[im 5/37  brain]
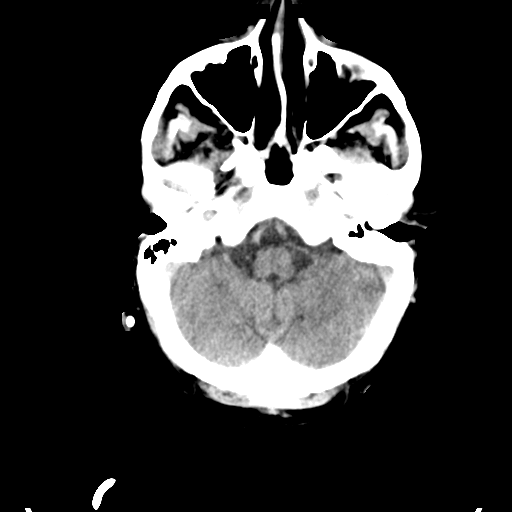
[im 5/37  bone]
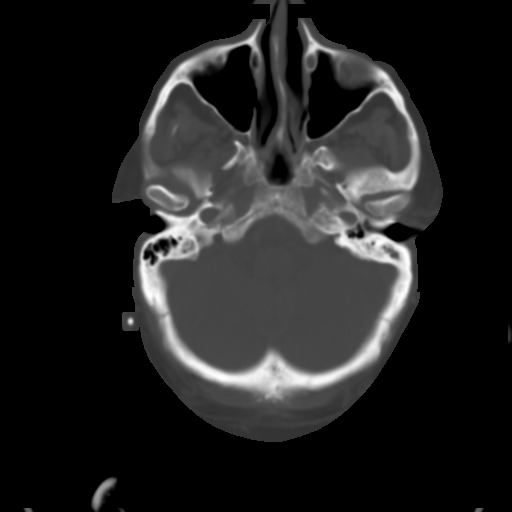
[im 10/37  brain]
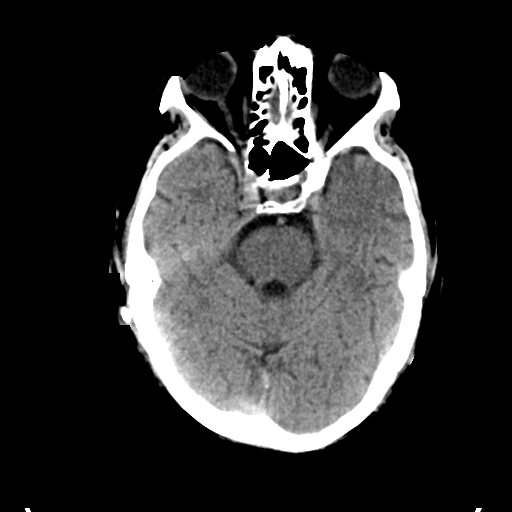
[im 14/37  brain]
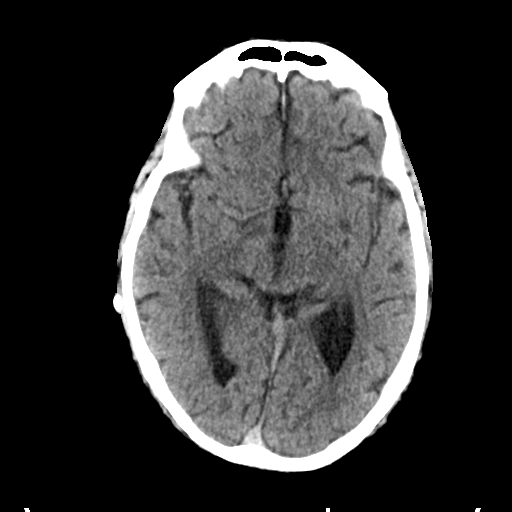
[im 19/37  brain]
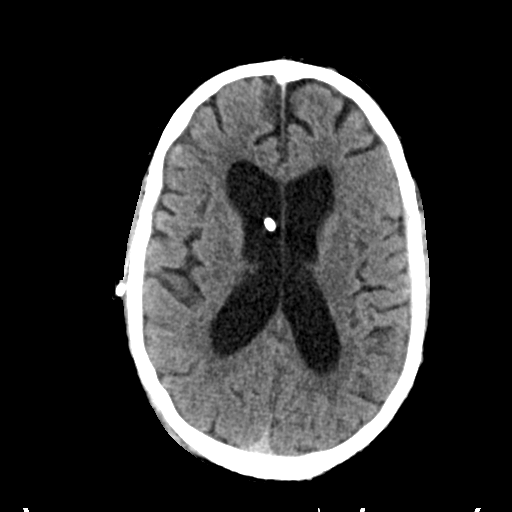
[im 23/37  brain]
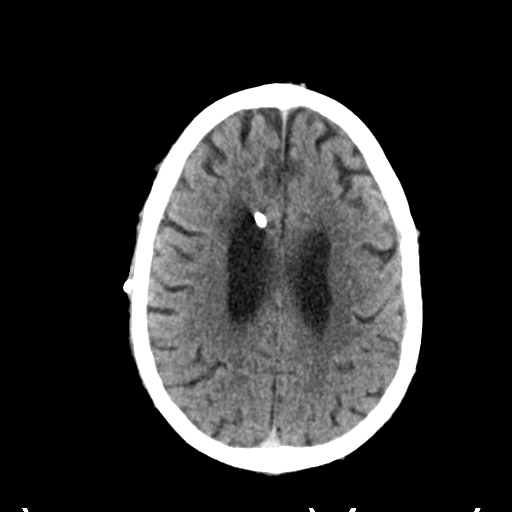
[im 23/37  bone]
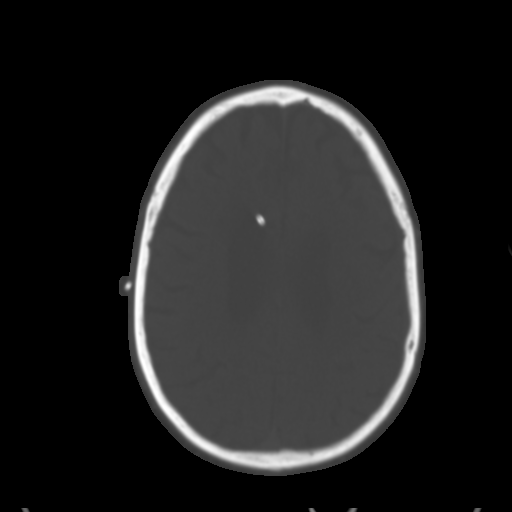
[im 28/37  brain]
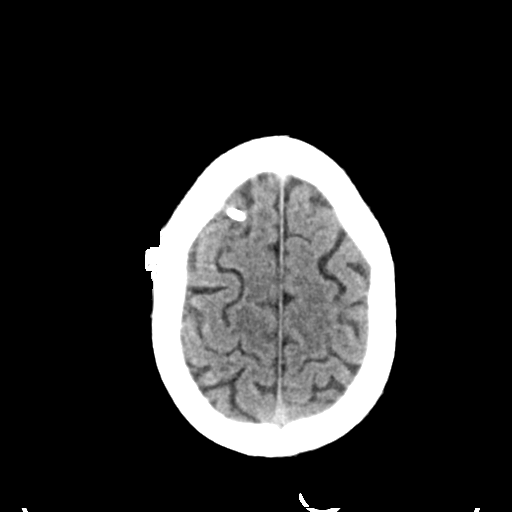
[im 32/37  brain]
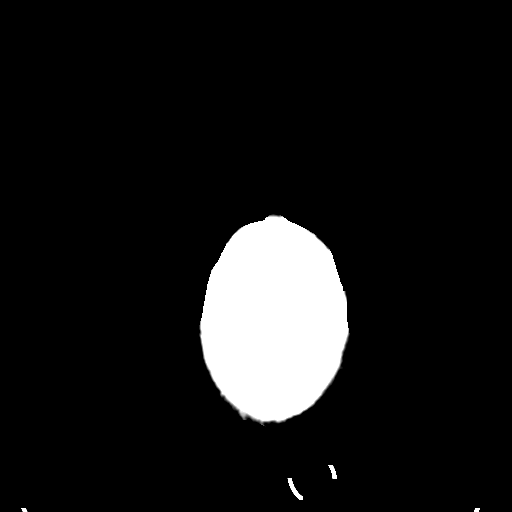

[Series 5: head bone · axial · 0.45mm/px · z∈[-73,-37]mm · 3 of 93 slices shown]
[im 10/93  bone]
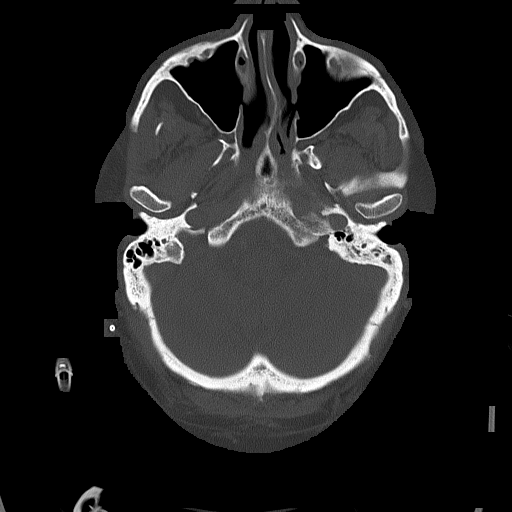
[im 19/93  bone]
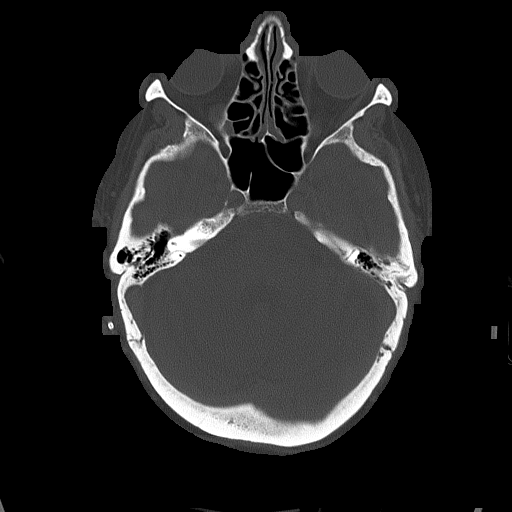
[im 28/93  bone]
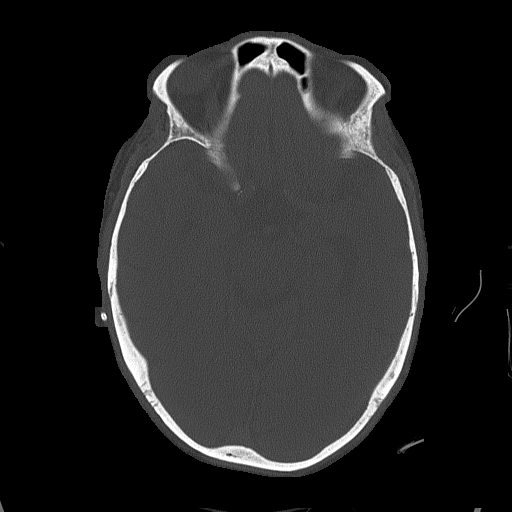

[Series 6: head without cor · coronal · non-contrast · 0.36mm/px · 3 of 69 slices shown]
[im 23/69  brain]
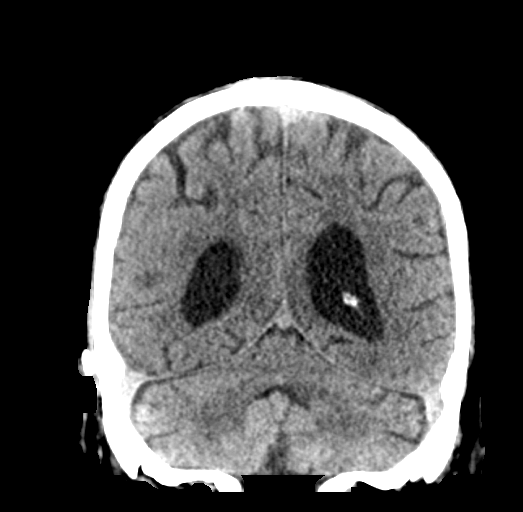
[im 31/69  brain]
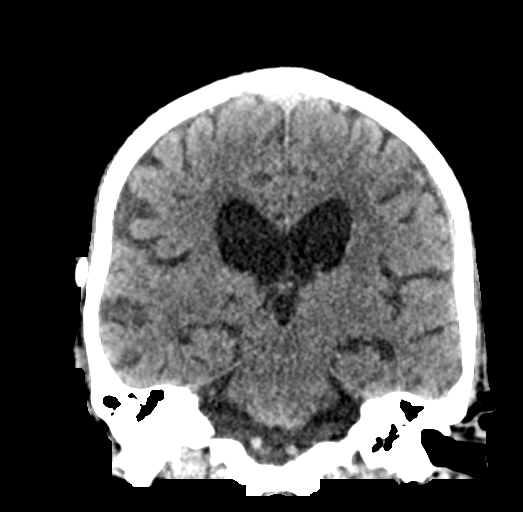
[im 38/69  brain]
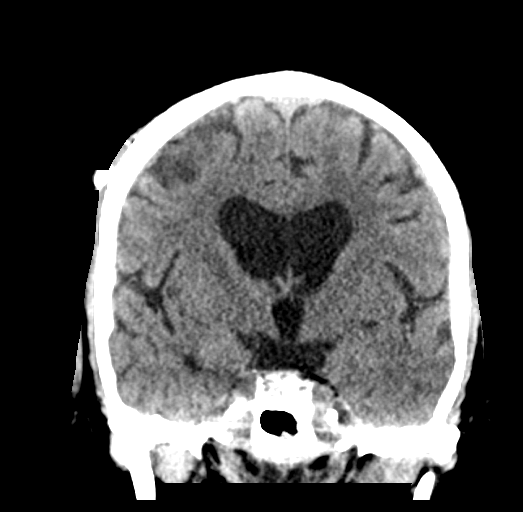

[Series 7: head without sag · sagittal · non-contrast · 0.36mm/px · 3 of 51 slices shown]
[im 17/51  brain]
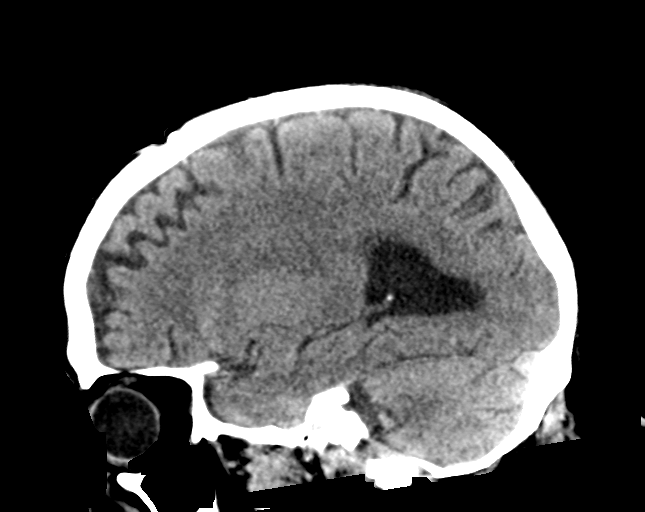
[im 26/51  brain]
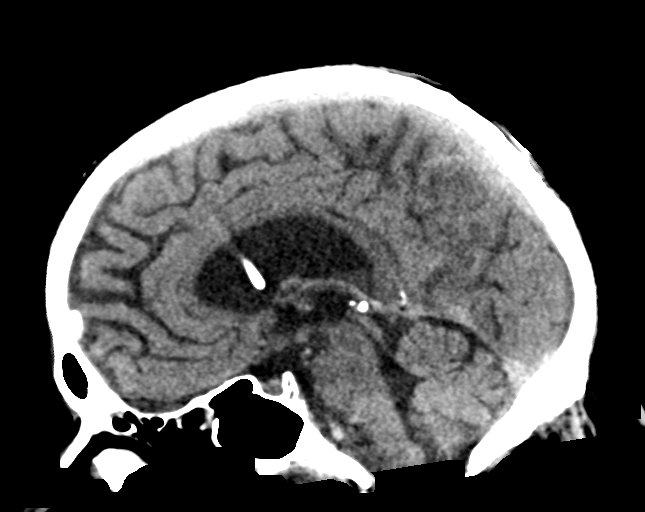
[im 34/51  brain]
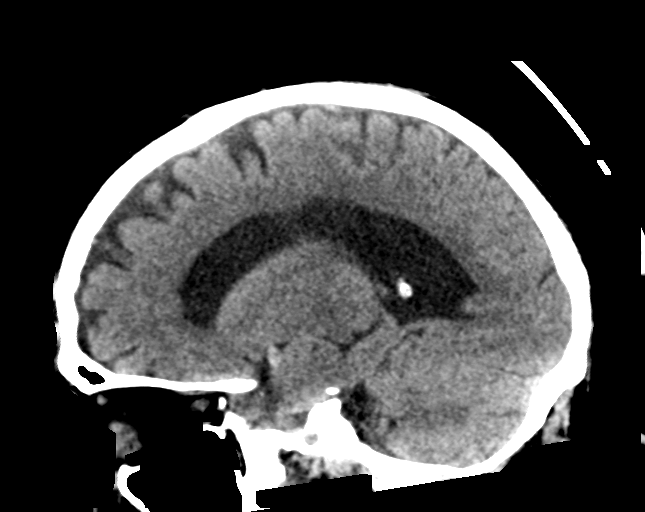

[16 of 47 positions shown; findings below may reference images not displayed]

FINDINGS: CT HEAD FINDINGS

Brain: No evidence of acute infarction, hemorrhage, extra-axial
collection or mass lesion/mass effect. Mild diffuse atrophy is
unchanged. Right frontal ventriculostomy catheter is stable in
position ending in the frontal horn of the right lateral ventricle.
Ventricular size is mildly dilated and unchanged. There is stable
mild periventricular white matter hypodensity, likely chronic small
vessel ischemic change. There is a stable old lacunar infarct in the
right basal ganglia.

Vascular: No hyperdense vessel or unexpected calcification.

Skull: Normal. Negative for fracture or focal lesion.

Sinuses/Orbits: No acute finding.

Other: None.

CT CERVICAL SPINE FINDINGS

Alignment: Normal.

Skull base and vertebrae: No acute fracture. No primary bone lesion
or focal pathologic process.

Soft tissues and spinal canal: No prevertebral fluid or swelling. No
visible canal hematoma.

Disc levels: There is mild disc space narrowing and endplate
osteophyte formation compatible with degenerative change at C4-C5.
No significant central canal or neural foraminal stenosis at any
level.

Upper chest: Negative.

Other: There is fluid in the nasopharynx. Enteric tube is present.
Patient is intubated.
IMPRESSION: 1.  No acute intracranial process.

2. No acute fracture or traumatic subluxation of the cervical spine.

3.  VP shunt is unchanged in position.  Ventricular size is stable.

## 2021-02-05 IMAGING — CT CT CERVICAL SPINE W/O CM
4 of 7 series · 14 of 33 positions shown, 15 images · non-contrast
Comparison: CT head [DATE].  MRI brain [DATE].

CLINICAL DATA: Trauma.

EXAM:
CT HEAD WITHOUT CONTRAST
CT CERVICAL SPINE WITHOUT CONTRAST
TECHNIQUE: Multidetector CT imaging of the head and cervical spine was
performed following the standard protocol without intravenous
contrast. Multiplanar CT image reconstructions of the cervical spine
were also generated.

[Series 5: c_spine 2.0 orthogonals · axial · 0.30mm/px · z∈[-278,-181]mm · 3 of 93 slices shown, 4 images]
[im 24/93  soft-tissue]
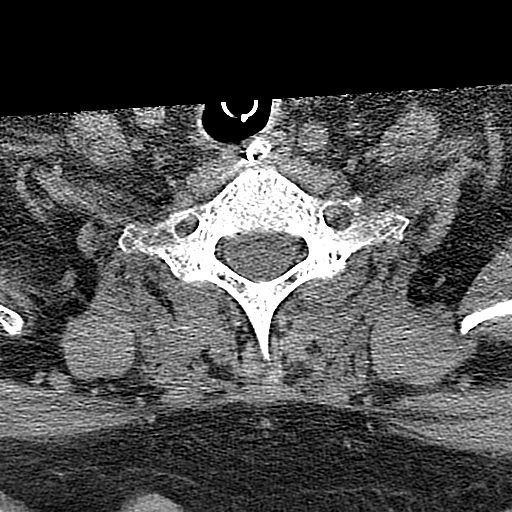
[im 24/93  bone]
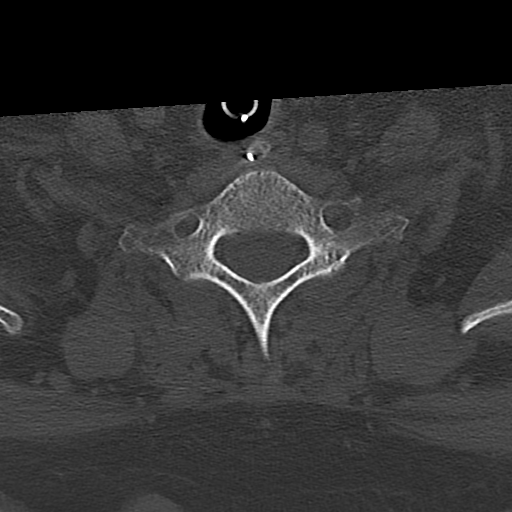
[im 47/93  bone]
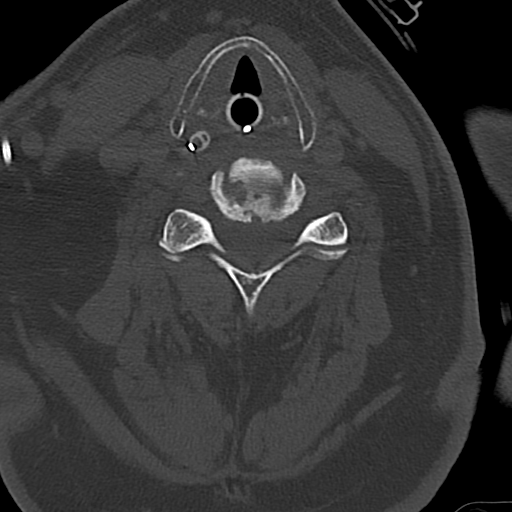
[im 70/93  bone]
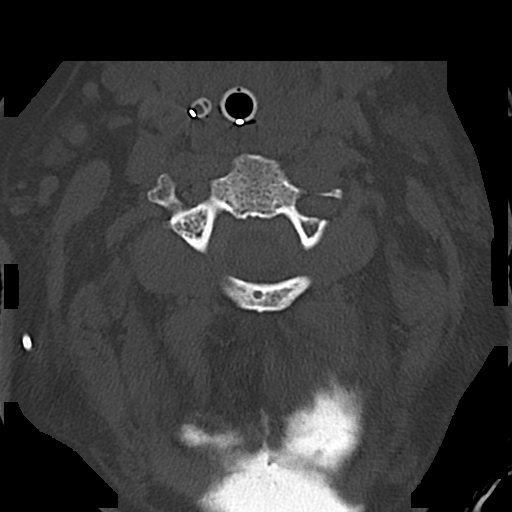

[Series 8: c_spine 2.0 sag bone · sagittal · 0.20mm/px · 5 of 61 slices shown]
[im 11/61  bone]
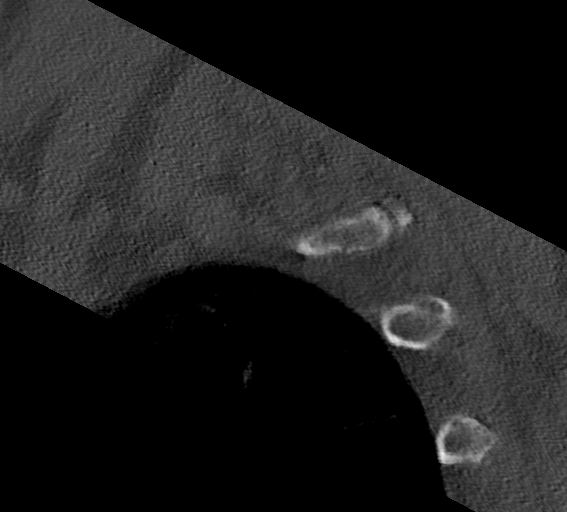
[im 21/61  bone]
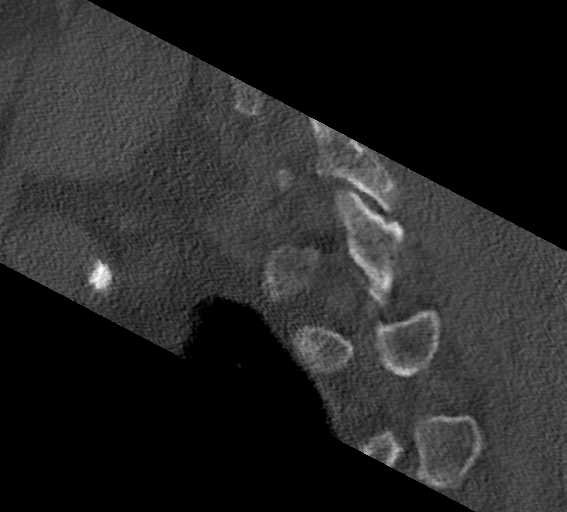
[im 31/61  bone]
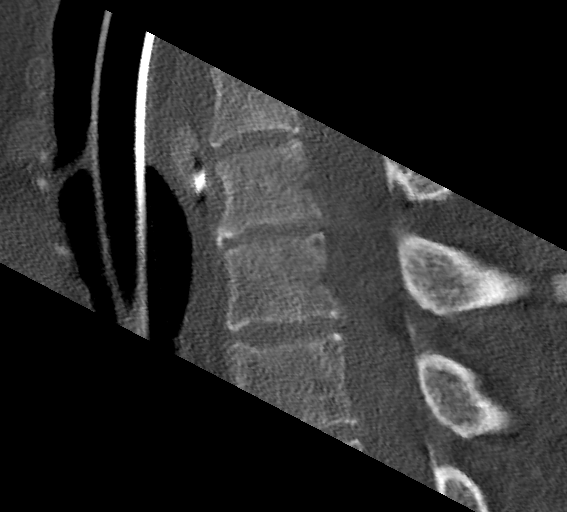
[im 41/61  bone]
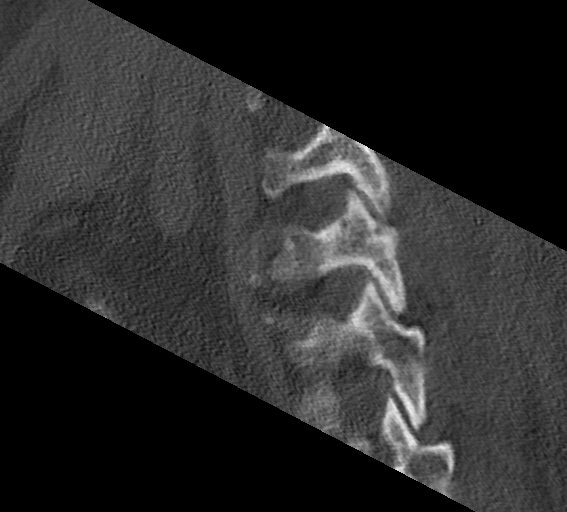
[im 51/61  bone]
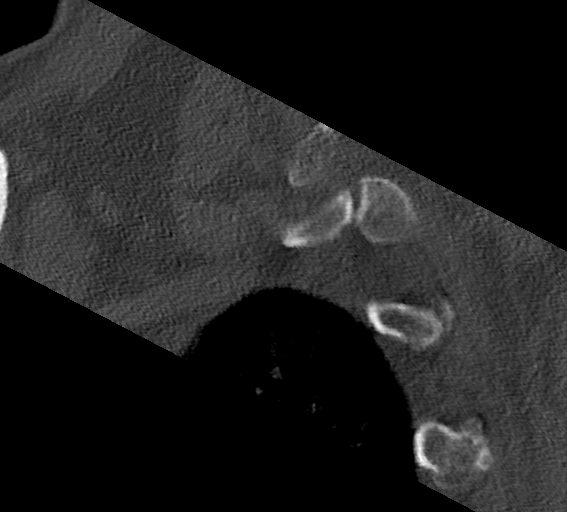

[Series 9: c_spine 2.0 st · axial · 0.29mm/px · z∈[-216,-140]mm · 3 of 78 slices shown]
[im 20/78  bone]
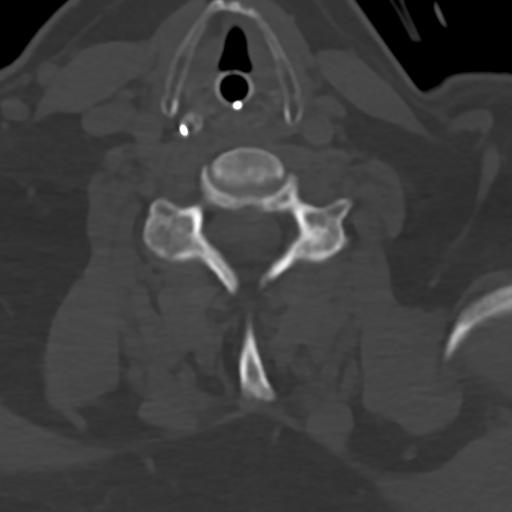
[im 39/78  bone]
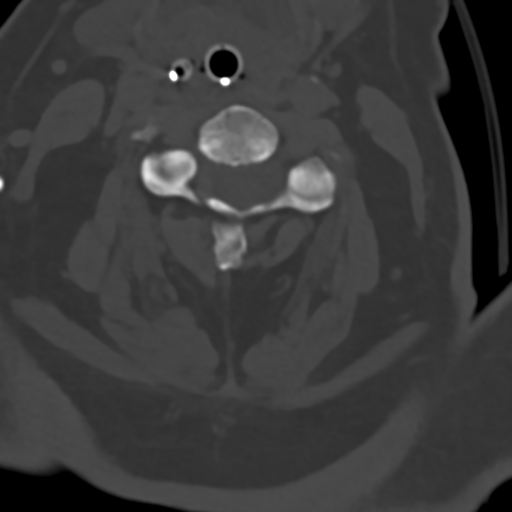
[im 58/78  bone]
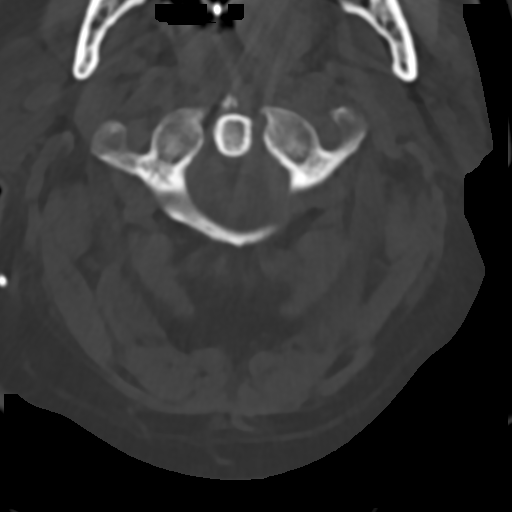

[Series 12: c_spine 2.0 cor bone · coronal · 0.31mm/px · 3 of 80 slices shown]
[im 16/80  bone]
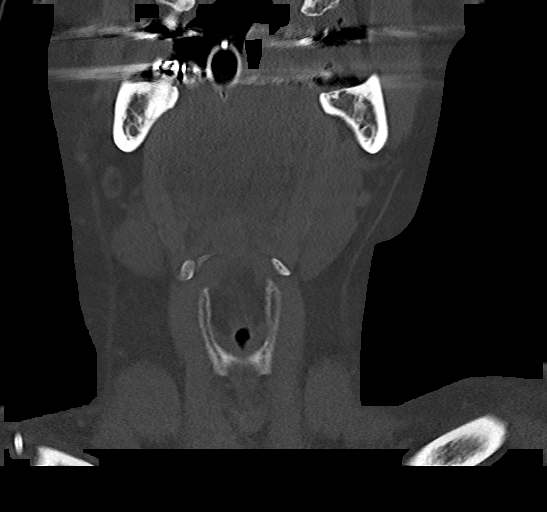
[im 32/80  bone]
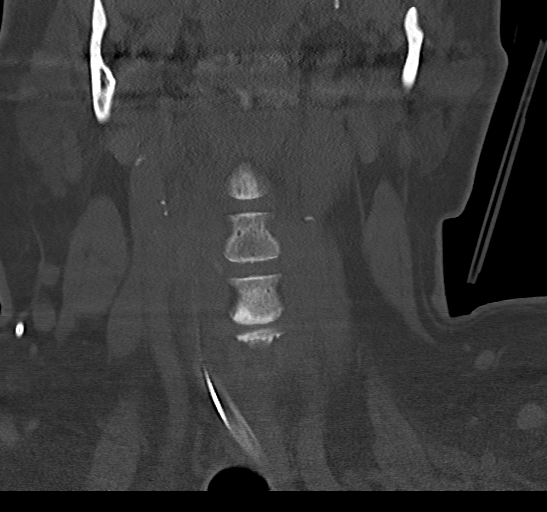
[im 48/80  bone]
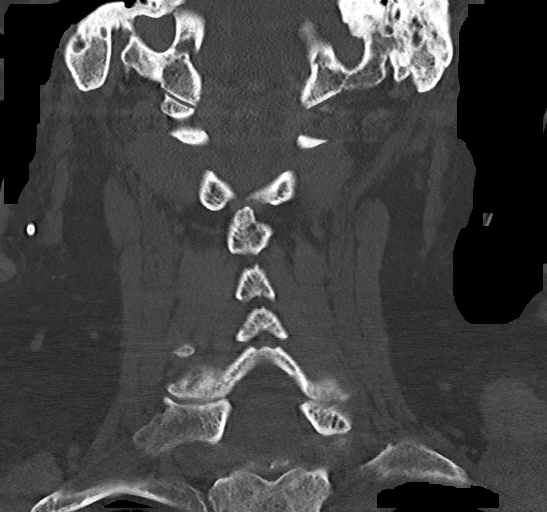

[14 of 33 positions shown; findings below may reference images not displayed]

FINDINGS: CT HEAD FINDINGS

Brain: No evidence of acute infarction, hemorrhage, extra-axial
collection or mass lesion/mass effect. Mild diffuse atrophy is
unchanged. Right frontal ventriculostomy catheter is stable in
position ending in the frontal horn of the right lateral ventricle.
Ventricular size is mildly dilated and unchanged. There is stable
mild periventricular white matter hypodensity, likely chronic small
vessel ischemic change. There is a stable old lacunar infarct in the
right basal ganglia.

Vascular: No hyperdense vessel or unexpected calcification.

Skull: Normal. Negative for fracture or focal lesion.

Sinuses/Orbits: No acute finding.

Other: None.

CT CERVICAL SPINE FINDINGS

Alignment: Normal.

Skull base and vertebrae: No acute fracture. No primary bone lesion
or focal pathologic process.

Soft tissues and spinal canal: No prevertebral fluid or swelling. No
visible canal hematoma.

Disc levels: There is mild disc space narrowing and endplate
osteophyte formation compatible with degenerative change at C4-C5.
No significant central canal or neural foraminal stenosis at any
level.

Upper chest: Negative.

Other: There is fluid in the nasopharynx. Enteric tube is present.
Patient is intubated.
IMPRESSION: 1.  No acute intracranial process.

2. No acute fracture or traumatic subluxation of the cervical spine.

3.  VP shunt is unchanged in position.  Ventricular size is stable.

## 2021-02-05 MED ORDER — LEVETIRACETAM IN NACL 1000 MG/100ML IV SOLN
1000.0000 mg | Freq: Once | INTRAVENOUS | Status: AC
  Administered 2021-02-05: 22:00:00 1000 mg via INTRAVENOUS
  Filled 2021-02-05: qty 100

## 2021-02-05 MED ORDER — CHLORHEXIDINE GLUCONATE 0.12% ORAL RINSE (MEDLINE KIT)
15.0000 mL | Freq: Two times a day (BID) | OROMUCOSAL | Status: DC
  Administered 2021-02-05 – 2021-02-12 (×14): 15 mL via OROMUCOSAL

## 2021-02-05 MED ORDER — ORAL CARE MOUTH RINSE
15.0000 mL | OROMUCOSAL | Status: DC
  Administered 2021-02-05 – 2021-02-12 (×67): 15 mL via OROMUCOSAL

## 2021-02-05 MED ORDER — LEVETIRACETAM IN NACL 1000 MG/100ML IV SOLN
1000.0000 mg | Freq: Once | INTRAVENOUS | Status: AC
Start: 2021-02-05 — End: 2021-02-05
  Administered 2021-02-05: 22:00:00 1000 mg via INTRAVENOUS
  Filled 2021-02-05: qty 100

## 2021-02-05 MED ORDER — SODIUM CHLORIDE 0.9 % IV SOLN: 2000.0000 mg | Freq: Once | INTRAVENOUS | Status: DC

## 2021-02-05 MED ORDER — THIAMINE HCL 100 MG/ML IJ SOLN: 100.0000 mg | Freq: Every day | INTRAMUSCULAR | Status: DC

## 2021-02-05 MED ORDER — INSULIN ASPART 100 UNIT/ML IJ SOLN
8.0000 [IU] | Freq: Once | INTRAMUSCULAR | Status: AC
  Administered 2021-02-05: 19:00:00 8 [IU] via INTRAVENOUS

## 2021-02-05 MED ORDER — PROPOFOL 1000 MG/100ML IV EMUL
5.0000 ug/kg/min | INTRAVENOUS | Status: DC
  Administered 2021-02-05: 18:00:00 5 ug/kg/min via INTRAVENOUS

## 2021-02-05 MED ORDER — IOHEXOL 350 MG/ML SOLN
150.0000 mL | Freq: Once | INTRAVENOUS | Status: AC | PRN
  Administered 2021-02-05: 21:00:00 150 mL via INTRAVENOUS

## 2021-02-05 MED ORDER — POLYETHYLENE GLYCOL 3350 17 G PO PACK: 17.0000 g | PACK | Freq: Every day | ORAL | Status: DC | PRN

## 2021-02-05 MED ORDER — FENTANYL CITRATE PF 50 MCG/ML IJ SOSY: 25.0000 ug | PREFILLED_SYRINGE | INTRAMUSCULAR | Status: DC | PRN

## 2021-02-05 MED ORDER — THIAMINE HCL 100 MG/ML IJ SOLN
250.0000 mg | Freq: Every day | INTRAVENOUS | Status: DC
  Administered 2021-02-08 – 2021-02-12 (×5): 250 mg via INTRAVENOUS
  Filled 2021-02-05 (×5): qty 2.5

## 2021-02-05 MED ORDER — INSULIN ASPART 100 UNIT/ML IJ SOLN
0.0000 [IU] | INTRAMUSCULAR | Status: DC
  Administered 2021-02-05: 21:00:00 5 [IU] via SUBCUTANEOUS
  Administered 2021-02-06: 16:00:00 3 [IU] via SUBCUTANEOUS
  Administered 2021-02-06: 13:00:00 5 [IU] via SUBCUTANEOUS
  Administered 2021-02-06 (×4): 3 [IU] via SUBCUTANEOUS
  Administered 2021-02-07: 2 [IU] via SUBCUTANEOUS
  Administered 2021-02-07: 5 [IU] via SUBCUTANEOUS
  Administered 2021-02-07 (×4): 3 [IU] via SUBCUTANEOUS
  Administered 2021-02-08 (×2): 8 [IU] via SUBCUTANEOUS
  Administered 2021-02-08: 5 [IU] via SUBCUTANEOUS
  Administered 2021-02-08: 11 [IU] via SUBCUTANEOUS
  Administered 2021-02-08: 15 [IU] via SUBCUTANEOUS
  Administered 2021-02-08: 5 [IU] via SUBCUTANEOUS
  Administered 2021-02-09 (×2): 8 [IU] via SUBCUTANEOUS
  Administered 2021-02-09: 11 [IU] via SUBCUTANEOUS
  Administered 2021-02-09: 15 [IU] via SUBCUTANEOUS
  Administered 2021-02-09 (×2): 5 [IU] via SUBCUTANEOUS
  Administered 2021-02-10: 8 [IU] via SUBCUTANEOUS
  Administered 2021-02-10 (×3): 5 [IU] via SUBCUTANEOUS
  Administered 2021-02-10 (×2): 8 [IU] via SUBCUTANEOUS
  Administered 2021-02-11: 11 [IU] via SUBCUTANEOUS
  Administered 2021-02-11 (×3): 8 [IU] via SUBCUTANEOUS
  Administered 2021-02-11: 5 [IU] via SUBCUTANEOUS
  Administered 2021-02-11: 8 [IU] via SUBCUTANEOUS
  Administered 2021-02-12: 04:00:00 5 [IU] via SUBCUTANEOUS
  Administered 2021-02-12: 12:00:00 11 [IU] via SUBCUTANEOUS
  Administered 2021-02-12: 08:00:00 8 [IU] via SUBCUTANEOUS
  Administered 2021-02-12: 01:00:00 11 [IU] via SUBCUTANEOUS

## 2021-02-05 MED ORDER — HEPARIN SODIUM (PORCINE) 5000 UNIT/ML IJ SOLN
5000.0000 [IU] | Freq: Three times a day (TID) | INTRAMUSCULAR | Status: DC
  Administered 2021-02-06 – 2021-02-12 (×18): 5000 [IU] via SUBCUTANEOUS
  Filled 2021-02-05 (×19): qty 1

## 2021-02-05 MED ORDER — POLYETHYLENE GLYCOL 3350 17 G PO PACK
17.0000 g | PACK | Freq: Every day | ORAL | Status: DC
  Administered 2021-02-06 – 2021-02-12 (×7): 17 g
  Filled 2021-02-05 (×7): qty 1

## 2021-02-05 MED ORDER — DOCUSATE SODIUM 50 MG/5ML PO LIQD
100.0000 mg | Freq: Two times a day (BID) | ORAL | Status: DC
  Administered 2021-02-05 – 2021-02-12 (×12): 100 mg
  Filled 2021-02-05 (×14): qty 10

## 2021-02-05 MED ORDER — PROPOFOL 1000 MG/100ML IV EMUL
0.0000 ug/kg/min | INTRAVENOUS | Status: DC
  Administered 2021-02-05: 21:00:00 45 ug/kg/min via INTRAVENOUS
  Filled 2021-02-05: qty 100

## 2021-02-05 MED ORDER — PANTOPRAZOLE SODIUM 40 MG IV SOLR
40.0000 mg | Freq: Every day | INTRAVENOUS | Status: DC
  Administered 2021-02-05: 22:00:00 40 mg via INTRAVENOUS
  Filled 2021-02-05: qty 40

## 2021-02-05 MED ORDER — DOCUSATE SODIUM 50 MG/5ML PO LIQD
100.0000 mg | Freq: Two times a day (BID) | ORAL | Status: DC | PRN
  Administered 2021-02-08 – 2021-02-10 (×2): 100 mg

## 2021-02-05 MED ORDER — ETOMIDATE 2 MG/ML IV SOLN
INTRAVENOUS | Status: AC | PRN
  Administered 2021-02-05: 20 mg via INTRAVENOUS

## 2021-02-05 MED ORDER — THIAMINE HCL 100 MG/ML IJ SOLN
500.0000 mg | Freq: Three times a day (TID) | INTRAVENOUS | Status: AC
  Administered 2021-02-06 – 2021-02-07 (×6): 500 mg via INTRAVENOUS
  Filled 2021-02-05 (×6): qty 5

## 2021-02-05 MED ORDER — SODIUM CHLORIDE 0.9 % IV SOLN
INTRAVENOUS | Status: AC | PRN
  Administered 2021-02-05: 1000 mL via INTRAVENOUS

## 2021-02-05 MED ORDER — CHLORHEXIDINE GLUCONATE CLOTH 2 % EX PADS
6.0000 | MEDICATED_PAD | Freq: Every day | CUTANEOUS | Status: DC
  Administered 2021-02-06 – 2021-02-11 (×6): 6 via TOPICAL

## 2021-02-05 MED ORDER — ROCURONIUM BROMIDE 50 MG/5ML IV SOLN
INTRAVENOUS | Status: AC | PRN
  Administered 2021-02-05: 100 mg via INTRAVENOUS

## 2021-02-05 MED ORDER — FUROSEMIDE 10 MG/ML IJ SOLN
40.0000 mg | Freq: Once | INTRAMUSCULAR | Status: AC
  Administered 2021-02-05: 19:00:00 40 mg via INTRAVENOUS
  Filled 2021-02-05: qty 4

## 2021-02-05 MED ORDER — DEXTROSE 50 % IV SOLN
25.0000 g | Freq: Once | INTRAVENOUS | Status: AC
  Administered 2021-02-05: 19:00:00 25 g via INTRAVENOUS
  Filled 2021-02-05: qty 50

## 2021-02-05 NOTE — Progress Notes (Signed)
STAT EEG complete - results pending. ? ?

## 2021-02-05 NOTE — H&P (Signed)
NAME:  [name] Pellett., MRN:  [id number], DOB:  16-Mar-1950, LOS: 0 ADMISSION DATE:  01/16/2021, CONSULTATION DATE:  11/27 REFERRING MD:  Almyra Free, CHIEF COMPLAINT:  Fall and AMS   History of Present Illness:  Patient is encephalopathic and/or intubated. Therefore history has been obtained from chart review.   [name] Idler., is a 70 y.o. male, who presented to the [facility] ED as a level 1 trauma after a fall.  They have a pertinent past medical history of SDH/SAH, status epilepticus disorder, hydrocephalus s/p VP shunt (9/20), HTN, HLD, afib, DM2. CAD post DES (01/16) x1 to LAD  His last seen normal was by his wife around 11 AM on 11/27. He was found by his wife at 5 PM between the toilet and tub in the bathroom. He appeared unresponsive. EMS was called.  ED course was notable for initial GCS of 7. He wis intubated for airway protection. CT head and CSP was negative for intracranial process, with no fracture of CSP, VP shunt in unchanged position, with stable ventricles. Neurology was consulted. Afebrile, WBC 7.1, lactate 2.4,   PCCM was consulted for admission.   Pertinent  Medical History  SDH/SAH, status epilepticus disorder, hydrocephalus s/p VP shunt (9/20), HTN, HLD, afib, DM2. CAD post DES (01/16) x1 to LAD  Significant Hospital Events: Including procedures, antibiotic start and stop dates in addition to other pertinent events   11/27 presented to [facility] as level 1 trauma  Interim History / Subjective:  On 40 propofol  Unable to obtain subjective evaluation due to patient status  Objective   Blood pressure (!) 144/94, pulse 83, temperature (!) 97.5 F (36.4 C), temperature source Oral, resp. rate 18, height 5\' 9"  (1.753 m), weight 94 kg, SpO2 96 %.    Vent Mode: PRVC FiO2 (%):  [30 %-100 %] 30 % Set Rate:  [18 bmp] 18 bmp Vt Set:  [530 mL] 530 mL PEEP:  [5 cmH20] 5 cmH20 Plateau Pressure:  [15 cmH20-17 cmH20] 17 cmH20  No intake or output data in the 24 hours ending 02/03/2021  1916 Filed Weights   01/15/2021 1725  Weight: 94 kg    Examination: General: In bed, NAD, sedated HEENT: MM pink/moist, anicteric, atraumatic, some blood around RT  Neuro: RASS -4, PERRL, pupils 4, no reaction on L, questionable on R, +cough, +corneal on L, ? Cornal on 5. Riding vent, flexion in all extremities, no eye opening CV: S1S2, NSR, no m/r/g appreciated PULM:  air movement in  lobes, trachea midline, chest expansion symmetric GI: soft, bsx4 active, non-tender   Extremities: warm/dry, no pretibial edema, capillary refill less than 3 seconds  Skin:  no rashes or lesions noted  CXR: Stable lines and tubes, no pneumo or effusion  Resolved Hospital Problem list     Assessment & Plan:  Acute encephalopathy HX Status epilepticus HX SAH/SDH HX Hydrocephalus s/p VP shunt ETOH neg, COVID, FLU A&B neg, afebrile, WBC WNL, ? seizure vs stroke. ? vagal on toilet leading to syncope. Troponin pending. On keppra. CXR: Stable lines and tubes, no pneumo or effusion. No clear infections source. 12 lead with diffuse t wave inversions. Not hypotensive. Lactate minimally elevated.  -Neuro consulted appreciate assistance. CTA head and neck pending. Workup per neuro -Keppra load, AED per neuro -Continue neuroprotective measures- normothermia, euglycemia, HOB greater than 30, head in neutral alignment, normocapnia, normoxia.  -Neurochecks per unit protocol -Follow up troponin, TSH, ammonia, b1, b12, folate -Follow up blood and urine cultures  Endotracheally  intubated GCS 7, intubated for airway protection by EDP -LTVV strategy with tidal volumes of 4-8 cc/kg ideal body weight -Goal plateau pressures less than 30 and driving pressures less than 15 -Wean PEEP/FiO2 for SpO2 92-98% -VAP bundle -Daily SAT and SBT -PAD bundle with Propofol gtt and fentanyl push -RASS goal 0 to -1 -Follow intermittent CXR and ABG PRN  Fall ?Syncopal event vs stroke -Workup as above -Fall precautions  Lactic  aciodsis Lactate 2.4, possibly from metformin  -Supportive care  DM2 Suspect stress BG-206 -SSI -Check A1C  HX Afib HX CAD post DES (01/16) x1 to LAD HX HTN HX HLD Does not appear to be on AC. In NSR on exam. -Holding home antihypertensives at this time -Follow up troponin -ASA/Statin per neuro  Best Practice (right click and "Reselect all SmartList Selections" daily)   Diet/type: NPO w/ meds via tube DVT prophylaxis: SCD, heparin ppx scheduled to start 11/28 at 1000.  GI prophylaxis: PPI Lines: N/A Foley:  N/A Code Status:  full code Last date of multidisciplinary goals of care discussion [Pending]  Labs   CBC: Recent Labs  Lab 01/31/2021 1725 01/14/2021 1736 01/27/2021 1837  WBC 7.1  --   --   HGB 17.0 17.7* 16.3  HCT 52.4* 52.0 48.0  MCV 89.0  --   --   PLT 170  --   --     Basic Metabolic Panel: Recent Labs  Lab 01/31/2021 1725 01/21/2021 1736 01/22/2021 1837  NA 135 135 137  K 4.6 6.4* 4.1  CL 101 102  --   CO2 21*  --   --   GLUCOSE 206* 202*  --   BUN 14 20  --   CREATININE 0.97 0.80  --   CALCIUM 8.9  --   --    GFR: Estimated Creatinine Clearance: 100 mL/min (by C-G formula based on SCr of 0.8 mg/dL). Recent Labs  Lab 02/08/2021 1725  WBC 7.1  LATICACIDVEN 2.4*    Liver Function Tests: Recent Labs  Lab 01/29/2021 1725  AST 29  ALT 17  ALKPHOS 56  BILITOT 1.1  PROT 6.3*  ALBUMIN 3.8   No results for input(s): LIPASE, AMYLASE in the last 168 hours. No results for input(s): AMMONIA in the last 168 hours.  ABG    Component Value Date/Time   PHART 7.450 01/31/2021 1837   PCO2ART 34.6 01/11/2021 1837   PO2ART 331 (H) 01/24/2021 1837   HCO3 24.2 02/04/2021 1837   TCO2 25 01/24/2021 1837   ACIDBASEDEF 10.0 (H) 07/28/2018 2052   O2SAT 100.0 01/11/2021 1837     Coagulation Profile: No results for input(s): INR, PROTIME in the last 168 hours.  Cardiac Enzymes: No results for input(s): CKTOTAL, CKMB, CKMBINDEX, TROPONINI in the last 168  hours.  HbA1C: Hgb A1c MFr Bld  Date/Time Value Ref Range Status  07/14/2020 03:56 PM 11.3 (H) 4.6 - 6.5 % Final    Comment:    Glycemic Control Guidelines for People with Diabetes:Non Diabetic:  <6%Goal of Therapy: <7%Additional Action Suggested:  >8%   08/12/2019 04:48 PM 10.5 (H) 4.8 - 5.6 % Final    Comment:             Prediabetes: 5.7 - 6.4          Diabetes: >6.4          Glycemic control for adults with diabetes: <7.0     CBG: No results for input(s): GLUCAP in the last 168 hours.  Review of  Systems:   Unable to obtain review of systems due to patient status  Past Medical History:  He,  has a past medical history of Abnormality of gait, Cognitive deficit as late effect of traumatic brain injury [facility]), Coronary artery disease, Dysrhythmia, History of non-ST elevation myocardial infarction (NSTEMI), Hydrocele, bilateral, Hyperlipemia, Hypertension, Lower urinary tract symptoms (LUTS), Memory deficit, PAF (paroxysmal atrial fibrillation) (HCC) (cardilogist-- dr Dickie La), S/P drug eluting coronary stent placement, SAH (subarachnoid hemorrhage) (HCC) (neurologist-  dr Terrace Arabia--- last head CT 10-01-2018 resolving (10-23-2018  residual deficits memory issues, abnormal gait, congnitive), Seizure disorder (HCC), Subdural hematoma, Type 2 diabetes mellitus (HCC), and Urinary incontinence.   Surgical History:   Past Surgical History:  Procedure Laterality Date   CATARACT EXTRACTION W/ INTRAOCULAR LENS  IMPLANT, BILATERAL  2008; 2009   CORONARY ANGIOPLASTY WITH STENT PLACEMENT  04-06-2014  @[facility]    DES x1 to LAD and POBA to D1   HYDROCELE EXCISION Bilateral 10/27/2018   Procedure: HYDROCELECTOMY ADULT;  Surgeon: 10/29/2018, MD;  Location: [facility];  Service: Urology;  Laterality: Bilateral;   TONSILLECTOMY  child   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   Procedure: Right ventriculoperitoneal shunt placement;  Surgeon: 12/04/2018, MD;  Location: [facility] OR;   Service: Neurosurgery;  Laterality: Right;     Social History:   reports that he has never smoked. He has never used smokeless tobacco. He reports that he does not currently use alcohol. He reports that he does not use drugs.   Family History:  His family history includes Appendicitis in his brother; Colitis in his sister; Early death in his mother; Hyperlipidemia in his father; Hypertension in his father; Stroke in his father.   Allergies No Known Allergies   Home Medications  Prior to Admission medications   Medication Sig Start Date End Date Taking? Authorizing Provider  aspirin 81 MG EC tablet Take 1 tablet by mouth daily. 04/19/20   [provider]  canagliflozin (INVOKANA) 100 MG TABS tablet Take 1 tablet (100 mg total) by mouth daily before breakfast. 07/16/20   Copland, 09/15/20, MD  glipiZIDE (GLUCOTROL XL) 5 MG 24 hr tablet Take 1 tablet (5 mg total) by mouth daily. Increase to twice a day after 1 weeks assuming no symptoms of low blood sugar 07/16/20   Copland, 09/15/20, MD  hydroxypropyl methylcellulose / hypromellose (ISOPTO TEARS / GONIOVISC) 2.5 % ophthalmic solution Place 1 drop into both eyes 3 (three) times daily as needed for dry eyes.    [provider]  lamoTRIgine (LAMICTAL) 100 MG tablet Take 1 tablet (100 mg total) by mouth 2 (two) times daily. 07/12/20   09/11/20, MD  mupirocin ointment (BACTROBAN) 2 % Apply 1 application topically 3 (three) times daily. 07/08/20   07/10/20, MD  rosuvastatin (CRESTOR) 5 MG tablet Take 5 mg by mouth daily. 04/19/20   [provider]  terbinafine (LAMISIL) 250 MG tablet Take 1 tablet (250 mg total) by mouth daily. 11/28/20   Copland, 11/30/20, MD  tolnaftate (TINACTIN) 1 % cream Apply 1 application topically daily as needed (athlete's foot).    [provider]     Critical care time: 40 minutes     Gwenlyn Found., MSN, APRN, AGACNP-BC [facility]  02/08/2021 ,  8:20 PM  Please see [email or web address] for pager details  If no response, please call [phone] After hours, please call Elink at [phone]

## 2021-02-05 NOTE — Progress Notes (Signed)
Orthopedic Tech Progress Note Patient Details:  [name] Frank 16-Jun-1950 [id number]  Patient ID: [name] Shutter., male   DOB: 07-07-1950, 70 y.o.   MRN: [id number] Level 1, not needed at the moment.  [name] Frank 01/12/2021, 5:35 PM

## 2021-02-05 NOTE — H&P (Addendum)
Admitting Physician: Hyman Hopes Ruchi Stoney  Service: Trauma Surgery  CC: Fall off toilet  Subjective   Mechanism of Injury: Torean Solar. is an 70 y.o. male who presented as a level 1 trauma after a fall from a toilet.  He has hydrocephalus and has problems like this when he has a shunt malfunction per his wife.  Some dried blood on his nose.  No other external signs of trauma.  Past Medical History:  Diagnosis Date   Abnormality of gait    uses walker and wheelchair   Cognitive deficit as late effect of traumatic brain injury [facility])    Coronary artery disease    cardiologist-- dr s. Dickie La---  04-06-2014  NSTEMI  s/p  cardiac cath DES x1 to LAD and POBA to D1   Dysrhythmia    History of non-ST elevation myocardial infarction (NSTEMI)    04-06-2013  s/p  coronary stent and PCI   Hydrocele, bilateral    Hyperlipemia    Hypertension    Lower urinary tract symptoms (LUTS)    Memory deficit    PAF (paroxysmal atrial fibrillation) (HCC) cardilogist-- dr Dickie La   on-set 05/ 2020 during admission for [facility],  w/ RVR,  with cardizem/ toprol, back in NSR,  f/u by cardiologist , event monitor 09-16-2018 no results in epic by per pt wife was told result ok with no afib   S/P drug eluting coronary stent placement    04-06-2014  @[facility]   DES x1 to LAD and POBA to D1   SAH (subarachnoid hemorrhage) [facility]) neurologist-  dr Terrace Arabia--- last head CT 10-01-2018 resolving (10-23-2018  residual deficits memory issues, abnormal gait, congnitive   w/ SDH due to unwitnessed fall, traumatic head injury---- admission 07-14-2018 , d/c'd 08-01-2018,  readmitted 3 days later with late TBI effects,  d/c'd from rehab 08-22-2018   Seizure disorder [facility])    followed by dr Terrace Arabia   Subdural hematoma (HCC)    Type 2 diabetes mellitus (HCC)    followed by pcp   Urinary incontinence    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    Past Surgical History:  Procedure Laterality Date   CATARACT EXTRACTION W/ INTRAOCULAR LENS  IMPLANT,  BILATERAL  2008; 2009   CORONARY ANGIOPLASTY WITH STENT PLACEMENT  04-06-2014  @[facility]    DES x1 to LAD and POBA to D1   HYDROCELE EXCISION Bilateral 10/27/2018   Procedure: HYDROCELECTOMY ADULT;  Surgeon: Ihor Gully, MD;  Location: [facility];  Service: Urology;  Laterality: Bilateral;   TONSILLECTOMY  child   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   Procedure: Right ventriculoperitoneal shunt placement;  Surgeon: Jadene Pierini, MD;  Location: [facility] OR;  Service: Neurosurgery;  Laterality: Right;    Family History  Problem Relation Age of Onset   Stroke Father    Hypertension Father    Hyperlipidemia Father    Early death Mother    Colitis Sister    Appendicitis Brother     Social:  reports that he has never smoked. He has never used smokeless tobacco. He reports that he does not currently use alcohol. He reports that he does not use drugs.  Allergies: No Known Allergies  Medications: Current Outpatient Medications  Medication Instructions   aspirin 81 MG EC tablet 1 tablet, Oral, Daily   canagliflozin (INVOKANA) 100 mg, Oral, Daily before breakfast   glipiZIDE (GLUCOTROL XL) 5 mg, Oral, Daily, Increase to twice a day after 1 weeks assuming no symptoms of low blood sugar  hydroxypropyl methylcellulose / hypromellose (ISOPTO TEARS / GONIOVISC) 2.5 % ophthalmic solution 1 drop, Both Eyes, 3 times daily PRN   lamoTRIgine (LAMICTAL) 100 mg, Oral, 2 times daily   mupirocin ointment (BACTROBAN) 2 % 1 application, Topical, 3 times daily   rosuvastatin (CRESTOR) 5 mg, Oral, Daily   terbinafine (LAMISIL) 250 mg, Oral, Daily   tolnaftate (TINACTIN) 1 % cream 1 application, Topical, Daily PRN    Objective   Primary Survey: Blood pressure 140/85, temperature (!) 97.5 F (36.4 C), temperature source Oral, resp. rate 20. Airway:  gurgling sounds Breathing:  bilateral breath sounds Circulation: Stable, Palpable peripheral pulses Disability:  clearly moving right  side spontaneously, left side less movement, no gross deformities ,   GCS Eyes: 2 - Eyes open to pain (not applied to face)  GCS Verbal: 1 - No verbal response  GCS Motor: 4 - Withdrawals from pain  GCS 7  Environment/Exposure: Warm, dry  Secondary Survey: Head:  dried blood on nostril Neck:  c-collar Chest: Bilateral breath sounds, chest wall stable Abdomen: Soft, non-tender, non-distended Upper Extremities: Strength and sensation unable to assess, palpable peripheral pulses Lower extremities: Strength and sensation unable to assess, palpable peripheral pulses Back: No step offs or deformities, atraumatic Rectal:  deferred Psych:  obtunded  Interventions in the trauma bay:  Endotracheal Intubation Peripheral IV access   No results found for this or any previous visit (from the past 24 hour(s)).   Imaging Orders         CT HEAD WO CONTRAST         CT CERVICAL SPINE WO CONTRAST      Assessment and Plan   Demetrick Eichenberger. is an 70 y.o. male who presented as a level 1 trauma after a fall off the toilet.  Injuries: Suspect shunt malfunction with possible head trauma from fall off toilet  Consults:  Pending CT results   Dispo -  pending workup  UPDATE: No traumatic injuries identified on CT.  Appears to be a medical cause of presentation.  Patient admitted to medical ICU team.  Please call trauma team back if needed.   Quentin Ore, MD  [facility] Use [email or web address] to contact on call provider

## 2021-02-05 NOTE — Progress Notes (Incomplete)
..  Trauma Response Nurse Note-  Reason for Call / Reason for Trauma activation:   Fell off toilet, LOC, unresponsive  Initial Focused Assessment (If applicable, or please see trauma documentation): BIB [facility] from home- wife stated that pt fell off the toilet and was not responsive. EMS encode initially stated GCS was 10- on arrival GCS was 7. I attempted to place a nasal trumpet in left nares but responded some to attempt-  IV 18 G in right hand per EMS   Interventions:  20 g IV started in left AC, RSi meds given, intubated per Dr. Audley Hose without incident. Transported to CT-  Plan of Care as of this note:  -  Event Summary:   -  The Following (if applicable):    -MD notified:     -Time of Page/Time of notification:     -TRN arrival Time:     -End time:

## 2021-02-05 NOTE — TOC CAGE-AID Note (Signed)
Transition of Care [facility]) - CAGE-AID Screening   Patient Details  Name: [name] Frank. MRN: [id number] Date of Birth: Jan 19, 1951  Contact:    Judie Bonus, RN Phone Number: [phone] 02-08-2021, 11:20 PM   Clinical Narrative:  Patient intubated unable to participate.   CAGE-AID Screening: Substance Abuse Screening unable to be completed due to: : Patient unable to participate                Substance abuse interventions: Other (must comment)

## 2021-02-05 NOTE — Progress Notes (Signed)
eLink Physician-Brief Progress Note Patient Name: [name] Frank. DOB: October 15, 1950 MRN: [id number]   Date of Service  02/08/2021  HPI/Events of Note  Admission note reviewd  eICU Interventions  Will follow up ct angio, trop     Intervention Category Evaluation Type: New Patient Evaluation  Jacinta Shoe 01/16/2021, 9:01 PM

## 2021-02-05 NOTE — ED Provider Notes (Signed)
[facility] [address] EMERGENCY DEPARTMENT Provider Note   CSN: [id number] Arrival date & time: 01/13/2021  1721     History Chief Complaint  Patient presents with   [name] Frank. is a 70 y.o. male.  Patient presents to ER chief complaint of a fall and unresponsiveness.  He was last seen normal by his wife around 61 AM today.  She later found him at about 5 PM which between the toilet and the tub in the bathroom.  He appeared unresponsive and ambulance was called.  No additional reports of recent illnesses provided.      Past Medical History:  Diagnosis Date   Abnormality of gait    uses walker and wheelchair   Cognitive deficit as late effect of traumatic brain injury [facility])    Coronary artery disease    cardiologist-- dr s. Jake Bathe---  04-06-2014  NSTEMI  s/p  cardiac cath DES x1 to LAD and POBA to D1   Dysrhythmia    History of non-ST elevation myocardial infarction (NSTEMI)    04-06-2013  s/p  coronary stent and PCI   Hydrocele, bilateral    Hyperlipemia    Hypertension    Lower urinary tract symptoms (LUTS)    Memory deficit    PAF (paroxysmal atrial fibrillation) ([place]) cardilogist-- dr Jake Bathe   on-set 05/ 2020 during admission for [facility],  w/ RVR,  with cardizem/ toprol, back in NSR,  f/u by cardiologist , event monitor 09-16-2018 no results in epic by per pt wife was told result ok with no afib   S/P drug eluting coronary stent placement    04-06-2014  @[facility]   DES x1 to LAD and POBA to D1   SAH (subarachnoid hemorrhage) [facility]) neurologist-  dr Krista Blue--- last head CT 10-01-2018 resolving (10-23-2018  residual deficits memory issues, abnormal gait, congnitive   w/ SDH due to unwitnessed fall, traumatic head injury---- admission 07-14-2018 , d/c'd 08-01-2018,  readmitted 3 days later with late TBI effects,  d/c'd from rehab 08-22-2018   Seizure disorder [facility])    followed by dr Krista Blue   Subdural hematoma    Type 2 diabetes mellitus ([place])    followed by pcp    Urinary incontinence    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    Patient Active Problem List   Diagnosis Date Noted   Partial symptomatic epilepsy with complex partial seizures, intractable, with status epilepticus ([place]) 07/12/2020   Gait abnormality 11/23/2019   Complex partial status epilepticus ([place]) 08/13/2019   Uncontrolled type 2 diabetes mellitus 08/13/2019   Acute confusion 08/12/2019   Unspecified abnormalities of gait and mobility 12/16/2018   Status post ventriculo-peritoneal shunt placement 12/02/2018   Normal pressure hydrocephalus ([place]) 12/02/2018   History of subdural hematoma 11/18/2018   Urinary incontinence 11/18/2018   Memory loss 10/09/2018   Right hemiparesis ([place]) 09/04/2018   Cognitive deficit as late effect of traumatic brain injury ([place]) 09/04/2018   Bradycardia    Leukopenia    Hypoalbuminemia due to protein-calorie malnutrition (HCC)    Hydrocele    Hydrocephalus ([place]) 07/28/2018   Mild renal insufficiency 07/28/2018   Paroxysmal atrial fibrillation ([place]) 07/28/2018   Subdural hematoma 07/14/2018   Hypertension, essential 01/16/2018   Coronary artery disease involving native heart without angina pectoris 01/16/2018    Past Surgical History:  Procedure Laterality Date   CATARACT EXTRACTION W/ INTRAOCULAR LENS  IMPLANT, BILATERAL  2008; 2009   CORONARY ANGIOPLASTY WITH STENT PLACEMENT  04-06-2014  @[facility]   DES x1 to LAD and POBA to D1   HYDROCELE EXCISION Bilateral 10/27/2018   Procedure: HYDROCELECTOMY ADULT;  Surgeon: [name], MD;  [facility];  Service: Urology;  Laterality: Bilateral;   TONSILLECTOMY  child   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   Procedure: Right ventriculoperitoneal shunt placement;  Surgeon: Judith Part, MD;  Location: [place];  Service: Neurosurgery;  Laterality: Right;       Family History  Problem Relation Age of Onset   Stroke Father    Hypertension Father    Hyperlipidemia Father    Early  death Mother    Colitis Sister    Appendicitis Brother     Social History   Tobacco Use   Smoking status: Never   Smokeless tobacco: Never  Vaping Use   Vaping Use: Never used  Substance Use Topics   Alcohol use: Not Currently    Comment: VERY RARELY   Drug use: Never    Home Medications Prior to Admission medications   Medication Sig Start Date End Date Taking? Authorizing Provider  aspirin 81 MG EC tablet Take 1 tablet by mouth daily. 04/19/20   [provider]  canagliflozin (INVOKANA) 100 MG TABS tablet Take 1 tablet (100 mg total) by mouth daily before breakfast. 07/16/20   Copland, Gay Filler, MD  glipiZIDE (GLUCOTROL XL) 5 MG 24 hr tablet Take 1 tablet (5 mg total) by mouth daily. Increase to twice a day after 1 weeks assuming no symptoms of low blood sugar 07/16/20   Copland, Gay Filler, MD  hydroxypropyl methylcellulose / hypromellose (ISOPTO TEARS / GONIOVISC) 2.5 % ophthalmic solution Place 1 drop into both eyes 3 (three) times daily as needed for dry eyes.    [provider]  lamoTRIgine (LAMICTAL) 100 MG tablet Take 1 tablet (100 mg total) by mouth 2 (two) times daily. 07/12/20   Marcial Pacas, MD  mupirocin ointment (BACTROBAN) 2 % Apply 1 application topically 3 (three) times daily. 07/08/20   Kandra Nicolas, MD  rosuvastatin (CRESTOR) 5 MG tablet Take 5 mg by mouth daily. 04/19/20   [provider]  terbinafine (LAMISIL) 250 MG tablet Take 1 tablet (250 mg total) by mouth daily. 11/28/20   Copland, Gay Filler, MD  tolnaftate (TINACTIN) 1 % cream Apply 1 application topically daily as needed (athlete's foot).    [provider]    Allergies    Patient has no known allergies.  Review of Systems   Review of Systems  Unable to perform ROS: Mental status change   Physical Exam Updated Vital Signs BP 122/80   Pulse 83   Temp (!) 97.5 F (36.4 C) (Oral)   Resp (!) 25   Ht 5\' 9"  (1.753 m)   Wt 94 kg   SpO2 100%   BMI 30.60 kg/m    Physical Exam Constitutional:      Appearance: He is well-developed.     Comments: Obtunded  HENT:     Head: Normocephalic.     Nose:     Comments: Mild abrasion to tip of nose Eyes:     Conjunctiva/sclera: Conjunctivae normal.     Comments: Left pupil 4 and sluggish right pupil 3 and sluggish  Cardiovascular:     Rate and Rhythm: Normal rate.  Pulmonary:     Comments: Gurgling breathing.  Concern for airway protection. Abdominal:     Palpations: Abdomen is soft.  Skin:    Coloration: Skin is not jaundiced.  Neurological:  Comments: Obtunded, does withdraw to pain.  No eye-opening to painful stimuli or to voice.  No purposeful movement noted.    ED Results / Procedures / Treatments   Labs (all labs ordered are listed, but only abnormal results are displayed) Labs Reviewed  COMPREHENSIVE METABOLIC PANEL - Abnormal; Notable for the following components:      Result Value   CO2 21 (*)    Glucose, Bld 206 (*)    Total Protein 6.3 (*)    All other components within normal limits  CBC - Abnormal; Notable for the following components:   RBC 5.89 (*)    HCT 52.4 (*)    All other components within normal limits  I-STAT CHEM 8, ED - Abnormal; Notable for the following components:   Potassium 6.4 (*)    Glucose, Bld 202 (*)    Calcium, Ion 1.04 (*)    Hemoglobin 17.7 (*)    All other components within normal limits  I-STAT ARTERIAL BLOOD GAS, ED - Abnormal; Notable for the following components:   pO2, Arterial 331 (*)    All other components within normal limits  RESP PANEL BY RT-PCR (FLU A&B, COVID) ARPGX2  ETHANOL  URINALYSIS, ROUTINE W REFLEX MICROSCOPIC  LACTIC ACID, PLASMA  PROTIME-INR  BLOOD GAS, ARTERIAL  SAMPLE TO BLOOD BANK    EKG None  Radiology CT HEAD WO CONTRAST  Result Date: 01/10/2021 CLINICAL DATA:  Trauma. EXAM: CT HEAD WITHOUT CONTRAST CT CERVICAL SPINE WITHOUT CONTRAST TECHNIQUE: Multidetector CT imaging of the head and cervical spine was  performed following the standard protocol without intravenous contrast. Multiplanar CT image reconstructions of the cervical spine were also generated. COMPARISON:  CT head 01/16/2021.  MRI brain 08/12/2019. FINDINGS: CT HEAD FINDINGS Brain: No evidence of acute infarction, hemorrhage, extra-axial collection or mass lesion/mass effect. Mild diffuse atrophy is unchanged. Right frontal ventriculostomy catheter is stable in position ending in the frontal horn of the right lateral ventricle. Ventricular size is mildly dilated and unchanged. There is stable mild periventricular white matter hypodensity, likely chronic small vessel ischemic change. 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 Vascular: No hyperdense vessel or unexpected calcification. Skull: Normal. Negative for fracture or focal lesion. Sinuses/Orbits: No acute finding. Other: None. CT CERVICAL SPINE FINDINGS Alignment: Normal. Skull base and vertebrae: No acute fracture. No primary bone lesion or focal pathologic process. Soft tissues and spinal canal: No prevertebral fluid or swelling. No visible canal hematoma. Disc levels: There is mild disc space narrowing and endplate osteophyte formation compatible with degenerative change at C4-C5. No significant central canal or neural foraminal stenosis at any level. Upper chest: Negative. Other: There is fluid in the nasopharynx. Enteric tube is present. Patient is intubated. IMPRESSION: 1.  No acute intracranial process. 2. No acute fracture or traumatic subluxation of the cervical spine. 3.  VP shunt is unchanged in position.  Ventricular size is stable. Electronically Signed   By: Ronney Asters M.D.   On: 01/15/2021 18:04   CT CERVICAL SPINE WO CONTRAST  Result Date: 01/12/2021 CLINICAL DATA:  Trauma. EXAM: CT HEAD WITHOUT CONTRAST CT CERVICAL SPINE WITHOUT CONTRAST TECHNIQUE: Multidetector CT imaging of the head and cervical spine was performed following the standard protocol  without intravenous contrast. Multiplanar CT image reconstructions of the cervical spine were also generated. COMPARISON:  CT head 01/16/2021.  MRI brain 08/12/2019. FINDINGS: CT HEAD FINDINGS Brain: No evidence of acute infarction, hemorrhage, extra-axial collection or mass lesion/mass effect. Mild diffuse atrophy  is unchanged. Right frontal ventriculostomy catheter is stable in position ending in the frontal horn of the right lateral ventricle. Ventricular size is mildly dilated and unchanged. There is stable mild periventricular white matter hypodensity, likely chronic small vessel ischemic change. 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 Vascular: No hyperdense vessel or unexpected calcification. Skull: Normal. Negative for fracture or focal lesion. Sinuses/Orbits: No acute finding. Other: None. CT CERVICAL SPINE FINDINGS Alignment: Normal. Skull base and vertebrae: No acute fracture. No primary bone lesion or focal pathologic process. Soft tissues and spinal canal: No prevertebral fluid or swelling. No visible canal hematoma. Disc levels: There is mild disc space narrowing and endplate osteophyte formation compatible with degenerative change at C4-C5. No significant central canal or neural foraminal stenosis at any level. Upper chest: Negative. Other: There is fluid in the nasopharynx. Enteric tube is present. Patient is intubated. IMPRESSION: 1.  No acute intracranial process. 2. No acute fracture or traumatic subluxation of the cervical spine. 3.  VP shunt is unchanged in position.  Ventricular size is stable. Electronically Signed   By: Darliss Cheney M.D.   On: 01/20/2021 18:04    Procedures .Critical Care Performed by: Cheryll Cockayne, MD Authorized by: Cheryll Cockayne, MD   Critical care provider statement:    Critical care time (minutes):  40   Critical care time was exclusive of:  Separately billable procedures and treating other patients   Critical care was necessary to  treat or prevent imminent or life-threatening deterioration of the following conditions:  Respiratory failure, trauma and CNS failure or compromise Date/Time: 01/23/2021 6:41 PM Performed by: Cheryll Cockayne, MD Comments: Patient had an O2 saturation of 99% on facemask prior to intubation.  Medicated with 100 mg rocuronium and 20 mg etomidate.  Cords visualized with glide scope.  Patient intubated with 7.5 ET tube and secured at 23 at the lips.  No desaturations noted.  Good misting on the tube and breath sounds equal bilaterally.      Medications Ordered in ED Medications  etomidate (AMIDATE) injection (20 mg Intravenous Given 02/08/2021 1728)  rocuronium (ZEMURON) injection (100 mg Intravenous Given 02/07/2021 1729)  0.9 %  sodium chloride infusion (1,000 mLs Intravenous New Bag/Given 01/11/2021 1735)  propofol (DIPRIVAN) 1000 MG/100ML infusion (5 mcg/kg/min  94 kg Intravenous New Bag/Given 01/11/2021 1740)  dextrose 50 % solution 25 g (has no administration in time range)  insulin aspart (novoLOG) injection 8 Units (has no administration in time range)  furosemide (LASIX) injection 40 mg (has no administration in time range)    ED Course  I have reviewed the triage vital signs and the nursing notes.  Pertinent labs & imaging results that were available during my care of the patient were reviewed by me and considered in my medical decision making (see chart for details).    MDM Rules/Calculators/A&P                           Given he was a trauma level 2, on arrival GCS was 7 and he was upgraded to trauma level 1.  Due to gurgling breathing, altered mental status and trauma status, patient was intubated for airway protection.  Obtaining additional history from the EMS, they state that the wife told him that he was had been like this in the past and previously it was due to a complication of his VP shunt.  CT imaging pursued and ventricles  appear normal CT C-spine unremarkable.  Did  note the patient has a history of seizure disorder as well unclear when his last seizure might have been.  Neurology consultation requested.  ICU consulted for admission.   Final Clinical Impression(s) / ED Diagnoses Final diagnoses:  Injury of head, initial encounter  Obtunded  Respiratory failure, unspecified chronicity, unspecified whether with hypoxia or hypercapnia [facility])    Rx / DC Orders ED Discharge Orders     None        Luna Fuse, MD 01/15/2021 1842

## 2021-02-05 NOTE — ED Triage Notes (Signed)
Per [facility] pt c/o home was found by wife between toilet and tub. Last seen at 11:15 today. Incontinent of urine. Patient responsive to pain with mild gag reflex. C-collar in place. Left pupil 4 and non reactive, right pupil 2 and sluggish.

## 2021-02-05 NOTE — ED Notes (Signed)
Patient transported to CT 

## 2021-02-05 NOTE — Consult Note (Signed)
NEUROLOGY CONSULTATION NOTE   Date of service: February 05, 2021 Patient Name: [name] Frank. MRN:  [id number] DOB:  Feb 01, 1951 Reason for consult: "Unresponsiveness" Requesting Provider: Collier Bullock, MD _ _ _   _ __   _ __ _ _  __ __   _ __   __ _  History of Present Illness  [name] Frank. is a 70 y.o. 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  Wife reports that she saw him last at 63 AM and he was reporting trouble with balance and felt like he was falling so she helped him to the bed.  She left for work and she came back around 5 PM and found him wedged between the toilet in the shower.  He was breathing very deep and was unresponsive.  She immediately called EMS and he was brought into the hospital.  She reports that over the last couple months he has had low energy and is also been having worsening trouble with his walking.  In the ED, he was intubated for GCS of 7 and unable to protect his airway.  He had a CT head without contrast which was negative for any acute intracranial abnormality.  CT of his cervical spine did not demonstrate any fractures.  The VP shunt appears unchanged in position and the size of the ventricles also appears stable.  In the ED, he was noted to have unequal pupils and neurology was consulted for further evaluation and management of acute unresponsiveness.    ROS   Unable to obtain due to review of system as patient is intubated and sedated.  Past History   Past Medical History:  Diagnosis Date   Abnormality of gait    uses walker and wheelchair   Cognitive  deficit as late effect of traumatic brain injury [facility])    Coronary artery disease    cardiologist-- dr s. Jake Bathe---  04-06-2014  NSTEMI  s/p  cardiac cath DES x1 to LAD and POBA to D1   Dysrhythmia    History of non-ST elevation myocardial infarction (NSTEMI)    04-06-2013  s/p  coronary stent and PCI   Hydrocele, bilateral    Hyperlipemia    Hypertension    Lower urinary tract symptoms (LUTS)    Memory deficit    PAF (paroxysmal atrial fibrillation) ([place]) cardilogist-- dr Jake Bathe   on-set 05/ 2020 during admission for [facility],  w/ RVR,  with cardizem/ toprol, back in NSR,  f/u by cardiologist , event monitor 09-16-2018 no results in epic by per pt wife was told result ok with no afib   S/P drug eluting coronary stent placement    04-06-2014  @[facility]   DES x1 to LAD and POBA to D1   SAH (subarachnoid hemorrhage) [facility]) neurologist-  dr Krista Blue--- last head CT 10-01-2018 resolving (10-23-2018  residual deficits memory issues, abnormal gait, congnitive   w/ SDH due to unwitnessed fall, traumatic head injury---- admission 07-14-2018 , d/c'd 08-01-2018,  readmitted 3 days later with late TBI effects,  d/c'd from rehab 08-22-2018   Seizure disorder [facility])    followed by dr Krista Blue   Subdural hematoma    Type 2  diabetes mellitus ([place])    followed by pcp   Urinary incontinence    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   Past Surgical History:  Procedure Laterality Date   CATARACT EXTRACTION W/ INTRAOCULAR LENS  IMPLANT, BILATERAL  2008; 2009   CORONARY ANGIOPLASTY WITH STENT PLACEMENT  04-06-2014  @[facility]    DES x1 to LAD and POBA to D1   HYDROCELE EXCISION Bilateral 10/27/2018   Procedure: HYDROCELECTOMY ADULT;  Surgeon: [name], MD;  Location: [facility];  Service: Urology;  Laterality: Bilateral;   TONSILLECTOMY  child   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   Procedure: Right ventriculoperitoneal shunt placement;  Surgeon: Judith Part, MD;  Location: [place];  Service: Neurosurgery;   Laterality: Right;   Family History  Problem Relation Age of Onset   Stroke Father    Hypertension Father    Hyperlipidemia Father    Early death Mother    Colitis Sister    Appendicitis Brother    Social History   Socioeconomic History   Marital status: Married    Spouse name: Not on file   Number of children: 2   Years of education: college   Highest education level: Master's degree (e.g., MA, MS, MEng, MEd, MSW, MBA)  Occupational History   Occupation: Retired  Tobacco Use   Smoking status: Never   Smokeless tobacco: Never  Vaping Use   Vaping Use: Never used  Substance and Sexual Activity   Alcohol use: Not Currently    Comment: VERY RARELY   Drug use: Never   Sexual activity: Not on file  Other Topics Concern   Not on file  Social History Narrative   Lives at home with his wife.   Right-handed.   Limited caffeine use.   Social Determinants of Health   Financial Resource Strain: Low Risk    Difficulty of Paying Living Expenses: Not hard at all  Food Insecurity: No Food Insecurity   Worried About Charity fundraiser in the Last Year: Never true   [place] in the Last Year: Never true  Transportation Needs: No Transportation Needs   Lack of Transportation (Medical): No   Lack of Transportation (Non-Medical): No  Physical Activity: Inactive   Days of Exercise per Week: 0 days   Minutes of Exercise per Session: 0 min  Stress: No Stress Concern Present   Feeling of Stress : Not at all  Social Connections: Socially Isolated   Frequency of Communication with Friends and Family: Once a week   Frequency of Social Gatherings with Friends and Family: Once a week   Attends Religious Services: Never   Marine scientist or Organizations: Not on file   Attends Archivist Meetings: Never   Marital Status: Married   No Known Allergies  Medications  (Not in a hospital admission)    Vitals   Vitals:   02/03/2021 1913 01/27/2021 1915 02/07/2021  1945 01/20/2021 2000  BP:  137/81 113/81 120/81  Pulse: 83 82 79 71  Resp: 18 18 18 18   Temp:      TempSrc:      SpO2: 96% 97% 96% 96%  Weight:      Height:         Body mass index is 30.6 kg/m.  Physical Exam   General: Laying comfortably in bed; intubated. HENT: Normal oropharynx and mucosa. Normal external appearance of ears and nose. Neck: Supple, no pain or tenderness CV: No JVD. No peripheral edema. Pulmonary:  Symmetric Chest rise. Not breathing over vent. Abdomen: Soft to touch, non-tender. Ext: No cyanosis, edema, or deformity Skin: No rash. Normal palpation of skin.  Musculoskeletal: Normal digits and nails by inspection. No clubbing.  Neurologic Examination performed on 76mcg/kg/min of propofol and about an hours and a half after rocuronium.  Mental status/Cognition: No response to loud voice. Does not open eyes. Does not grimace to noxious stimuli. Speech/language: Mute, no speech  Cranial nerves/brainstem: Pupils: 4 mm bilaterally, equal and round but unreactive to light. Corneals: Absent bilaterally. Cough: Present. Gag: Absent.  Motor/sensory:  Muscle bulk: normal, tone flaccid in all extremities. Initially felt to be decorticate posturing in bilateral upper extremities but on repeat evaluation, appears to be localizing with bilateral upper extremities to noxious stimuli. Initially felt to be triple flexion in bilateral lower extremity but on repeat examination noted to be withdrawing in bilateral lower extremities to noxious stimuli.  Reflexes:  Right Left Comments  Pectoralis      Biceps (C5/6) 2 2   Brachioradialis (C5/6) 2 2    Triceps (C6/7) 2 2    Patellar (L3/4) 2 2    Achilles (S1)      Hoffman      Plantar     Jaw jerk     Coordination/Complex Motor:  Unable to assess given his unresponsiveness. - Gait: Unsafe to assess given unresponsiveness.  Labs   CBC:  Recent Labs  Lab 01/28/2021 1725 01/16/2021 1736 01/25/2021 1837  WBC 7.1  --    --   HGB 17.0 17.7* 16.3  HCT 52.4* 52.0 48.0  MCV 89.0  --   --   PLT 170  --   --     Basic Metabolic Panel:  Lab Results  Component Value Date   NA 137 01/24/2021   K 4.1 02/03/2021   CO2 21 (L) 01/18/2021   GLUCOSE 202 (H) 01/19/2021   BUN 20 01/29/2021   CREATININE 0.80 01/28/2021   CALCIUM 8.9 01/13/2021   GFRNONAA >60 02/03/2021   GFRAA 105 08/18/2019   Lipid Panel:  Lab Results  Component Value Date   LDLCALC 148 (H) 07/14/2020   HgbA1c:  Lab Results  Component Value Date   HGBA1C 11.3 (H) 07/14/2020   Urine Drug Screen:     Component Value Date/Time   LABOPIA NONE DETECTED 07/14/2018 0319   COCAINSCRNUR NONE DETECTED 07/14/2018 0319   LABBENZ NONE DETECTED 07/14/2018 0319   AMPHETMU NONE DETECTED 07/14/2018 0319   THCU NONE DETECTED 07/14/2018 0319   LABBARB NONE DETECTED 07/14/2018 0319    Alcohol Level     Component Value Date/Time   ETH <10 01/14/2021 1725    CT Head without contrast(Personally reviewed): CTH was negative for a large hypodensity concerning for a large territory infarct or hyperdensity concerning for an ICH  CT angio Head and Neck with contrast(Personally reviewed): No LVO, specifically no basilar thrombosis. Basilar artery appears irregular but that was also noted on prior CTA for June 2021.  MRI Brain: Pending after cEEG  cEEG:  No seizures  Impression   [name] Frank. is a 70 y.o. 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  His neuro exam  is notable for patchy brainstem reflexes with nonreactive pupils, no corneal reflexes, no gag but cough is  positive with localization in bilateral upper extremities and withdrawing bilateral lower extremities.  CT head and CTA was negative for ICH or an acute thrombus in the basilar artery or any large vessel occlusion.  Vitals have been stable here and he has not had any fever or did not have any headache or other obvious signs of meningitis over the last few days.  He is not on any significant sedating medications at home and his drug screen is negative for opioids or any other substances.  No alcohol in the blood.  I do not have a clear cause for his unresponsiveness at this time.  Will evaluate for potential thiamine deficiency, get alcohol levels, no obvious concern for carbon monoxide poisoning.  We will get him up on continuous EEG to evaluate for subclinical seizures/status.  Impression: Acute encephalopathy with unresponsiveness of unclear cause. Endotracheal intubation Elevated lactate. History is partial status epilepticus. History of hydrocephalus status post VP shunt. History of subarachnoid hemorrhage and subdural hematoma.   Recommendations   - cEEG - Will load with Keppra 2G IV once - Continue Keppra 500mg  BID, gradually switch to Lamictal over a course of 6 weeks when he is able to tolerate PO intake. - MRI Brain with and without contrast after cEEG - Serum Folate, TSH, Vit B12, Vit B1 with Thiamine administration after B1 levels have been drawn, Co-oximetry panel. - Low suspicion for meningitis at this time. ______________________________________________________________________  This patient is critically ill and at significant risk of neurological worsening, death and care requires constant monitoring of vital signs, hemodynamics,respiratory and cardiac monitoring, neurological assessment, discussion with family, other specialists and medical decision making of high complexity. I spent 45 minutes of neurocritical care time  in the care of  this patient. This was time spent  independent of any time provided by nurse practitioner or PA.  Donnetta Simpers [facility] Pager Number [phone] 01/27/2021  11:23 PM   Thank you for the opportunity to take part in the care of this patient. If you have any further questions, please contact the neurology consultation attending.  Signed,  [place] Pager Number [phone] _ _ _   _ __   _ __ _ _  __ __   _ __   __ _

## 2021-02-05 NOTE — Progress Notes (Signed)
vLTM started  all impedances are below 10kohms  atrium to monitor  Patient event button tested

## 2021-02-05 NOTE — Progress Notes (Signed)
Care Contact Phone Call  Called [name] STOHR Jr.'s spouse, [name] Frank, at [phone]. They were updated on [name] DOUBLEDAY Jr.'s clinical status and plan.  Discussed code status with [name] Frank over the phone. [name] Frank would not want chest compressions if his heart were to stop. He would want defib/ACLS medications. Code status has been updated in the chart.  [name] Frank., MSN, APRN, AGACNP-BC [facility]  01/18/2021 , 9:37 PM  Please see [email or web address] for pager details  If no response, please call [phone] After hours, please call Elink at [phone]

## 2021-02-06 ENCOUNTER — Inpatient Hospital Stay (HOSPITAL_COMMUNITY): Payer: Medicare HMO

## 2021-02-06 DIAGNOSIS — W19XXXA Unspecified fall, initial encounter: Secondary | ICD-10-CM | POA: Diagnosis not present

## 2021-02-06 DIAGNOSIS — E872 Acidosis, unspecified: Secondary | ICD-10-CM

## 2021-02-06 DIAGNOSIS — R402432 Glasgow coma scale score 3-8, at arrival to emergency department: Secondary | ICD-10-CM

## 2021-02-06 DIAGNOSIS — Z978 Presence of other specified devices: Secondary | ICD-10-CM | POA: Diagnosis not present

## 2021-02-06 DIAGNOSIS — R4182 Altered mental status, unspecified: Secondary | ICD-10-CM | POA: Diagnosis not present

## 2021-02-06 DIAGNOSIS — R9431 Abnormal electrocardiogram [ECG] [EKG]: Secondary | ICD-10-CM

## 2021-02-06 DIAGNOSIS — R401 Stupor: Secondary | ICD-10-CM | POA: Diagnosis not present

## 2021-02-06 DIAGNOSIS — J9690 Respiratory failure, unspecified, unspecified whether with hypoxia or hypercapnia: Secondary | ICD-10-CM | POA: Diagnosis not present

## 2021-02-06 LAB — GLUCOSE, CAPILLARY
Glucose-Capillary: 175 mg/dL — ABNORMAL HIGH (ref 70–99)
Glucose-Capillary: 180 mg/dL — ABNORMAL HIGH (ref 70–99)
Glucose-Capillary: 194 mg/dL — ABNORMAL HIGH (ref 70–99)
Glucose-Capillary: 206 mg/dL — ABNORMAL HIGH (ref 70–99)
Glucose-Capillary: 208 mg/dL — ABNORMAL HIGH (ref 70–99)

## 2021-02-06 LAB — CBC
HCT: 51.9 % (ref 39.0–52.0)
Hemoglobin: 17.3 g/dL — ABNORMAL HIGH (ref 13.0–17.0)
MCH: 29.2 pg (ref 26.0–34.0)
MCHC: 33.3 g/dL (ref 30.0–36.0)
MCV: 87.7 fL (ref 80.0–100.0)
Platelets: 184 10*3/uL (ref 150–400)
RBC: 5.92 MIL/uL — ABNORMAL HIGH (ref 4.22–5.81)
RDW: 13.3 % (ref 11.5–15.5)
WBC: 8.4 10*3/uL (ref 4.0–10.5)
nRBC: 0 % (ref 0.0–0.2)

## 2021-02-06 LAB — BASIC METABOLIC PANEL
Anion gap: 11 (ref 5–15)
BUN: 14 mg/dL (ref 8–23)
CO2: 24 mmol/L (ref 22–32)
Calcium: 8.9 mg/dL (ref 8.9–10.3)
Chloride: 102 mmol/L (ref 98–111)
Creatinine, Ser: 0.9 mg/dL (ref 0.61–1.24)
GFR, Estimated: >60 mL/min (ref >60)
Glucose, Bld: 168 mg/dL — ABNORMAL HIGH (ref 70–99)
Potassium: 3.8 mmol/L (ref 3.5–5.1)
Sodium: 137 mmol/L (ref 135–145)

## 2021-02-06 LAB — URINE CULTURE: Culture: NO GROWTH

## 2021-02-06 LAB — ECHOCARDIOGRAM COMPLETE
AR max vel: 2.74 cm2
AV Area VTI: 2.36 cm2
AV Area mean vel: 3.07 cm2
AV Mean grad: 2 mmHg
AV Peak grad: 3.7 mmHg
Ao pk vel: 0.97 m/s
Area-P 1/2: 2.56 cm2
Calc EF: 54.8 %
Height: 69 in
MV VTI: 2.06 cm2
S' Lateral: 2.3 cm
Single Plane A2C EF: 45.5 %
Single Plane A4C EF: 57 %
Weight: 3188.73 oz

## 2021-02-06 LAB — TROPONIN I (HIGH SENSITIVITY)
Troponin I (High Sensitivity): 46 ng/L — ABNORMAL HIGH (ref <18)
Troponin I (High Sensitivity): 29 ng/L — ABNORMAL HIGH (ref <18)

## 2021-02-06 LAB — HEMOGLOBIN A1C
Hgb A1c MFr Bld: 9 % — ABNORMAL HIGH (ref 4.8–5.6)
Mean Plasma Glucose: 212 mg/dL

## 2021-02-06 LAB — LACTIC ACID, PLASMA: Lactic Acid, Venous: 2.4 mmol/L (ref 0.5–1.9)

## 2021-02-06 LAB — AMMONIA: Ammonia: 16 umol/L (ref 9–35)

## 2021-02-06 LAB — FOLATE: Folate: 12.9 ng/mL (ref >5.9)

## 2021-02-06 LAB — MAGNESIUM: Magnesium: 2.1 mg/dL (ref 1.7–2.4)

## 2021-02-06 LAB — TRIGLYCERIDES: Triglycerides: 200 mg/dL — ABNORMAL HIGH (ref <150)

## 2021-02-06 LAB — ETHANOL: Alcohol, Ethyl (B): <10 mg/dL (ref <10)

## 2021-02-06 LAB — PHOSPHORUS: Phosphorus: 2.5 mg/dL (ref 2.5–4.6)

## 2021-02-06 LAB — MRSA NEXT GEN BY PCR, NASAL: MRSA by PCR Next Gen: NOT DETECTED

## 2021-02-06 LAB — VITAMIN B12: Vitamin B-12: 260 pg/mL (ref 180–914)

## 2021-02-06 LAB — TSH: TSH: 1.664 u[IU]/mL (ref 0.350–4.500)

## 2021-02-06 MED ORDER — PANTOPRAZOLE 2 MG/ML SUSPENSION
40.0000 mg | Freq: Every day | ORAL | Status: DC
  Administered 2021-02-06 – 2021-02-11 (×6): 40 mg
  Filled 2021-02-06 (×6): qty 20

## 2021-02-06 MED ORDER — LEVETIRACETAM IN NACL 500 MG/100ML IV SOLN
500.0000 mg | Freq: Two times a day (BID) | INTRAVENOUS | Status: DC
  Administered 2021-02-06 – 2021-02-07 (×4): 500 mg via INTRAVENOUS
  Filled 2021-02-06 (×4): qty 100

## 2021-02-06 NOTE — Procedures (Addendum)
Patient Name: [name] [name].  MRN: [id number]  Epilepsy Attending: Charlsie Quest  Referring Physician/Provider: Dr Erick Blinks Duration: 03/03/2021 2200 to 02/06/2021 1151   Patient history: 70 year old male with past medical history of traumatic subdural hematomas and subarachnoid hemorrhage who presented with altered mental status.  EEG 20 for seizure.   Level of alertness: lethargic    AEDs during EEG study: LEV , propofol    Technical aspects: This EEG study was done with scalp electrodes positioned according to the 10-20 International system of electrode placement. Electrical activity was acquired at a sampling rate of 500Hz  and reviewed with a high frequency filter of 70Hz  and a low frequency filter of 1Hz . EEG data were recorded continuously and digitally stored.    Description: EEG showed continuous generalized 3 to 6 Hz theta-delta slowing admixed with 15 to 18 Hz frontocentral beta activity. Hyperventilation and photic stimulation were not performed.      ABNORMALITY - Continuous slow, generalized   IMPRESSION: This study is suggestive of moderate to severe diffuse encephalopathy, nonspecific etiology.  No seizures or epileptiform discharges were seen throughout the recording.   [name] 

## 2021-02-06 NOTE — Progress Notes (Signed)
PCCM Interval Progress Note  Spoke with patient's wife, Codey Burling at [phone]. Provided general update on patient's current clinical status and plan of care for the remainder of the day including MRI Brain.  Patient's wife wanted to make patient's healthcare team aware of the following:  Evaluated patient's pill box last night, he did not take any of his meds Saturday. She also notes that he was incontinent at the time she found him and was within arm's reach of his towel, robe, phone at the time of his fall and did not appear to have reached for any of these after his fall.  Over the last month-month and a half (since end of October), patient had been experiencing symptoms comparable to when he had worsening hydrocephalus including feeling "off balance", tiptoe walking and wanting to use his walker for stability. He also began to experience infrequent episodes of incontinence, prompting anxiety about public outings. Patient also recently complained of foggy memory and patient's wife noticed more forgetfulness/word finding issues. Given the similarity of these symptoms with previous episodes of hydrocephalus, Dr. Maurice Small evaluated shunt and wanted to replace it, but imaging did not demonstrate issues warranting replacement.  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  Advised that I would document and pass these concerns along to our ICU team as well as Neurology. Patient's wife was appreciative of the update. Advised we will continue to be available via phone as needed and will update her with any significant changes.  Tim Lair, PA-C [facility] 02/06/21 2:45 PM  Please see [email or web address] for pager details.  From 7A-7P if no response, please call [phone] After hours, please  call ELink [phone]

## 2021-02-06 NOTE — Progress Notes (Signed)
Subjective: Continues to be unresponsive. Propofol stopped overnight.   Objective: Current vital signs: BP 116/75   Pulse 76   Temp (!) 100.8 F (38.2 C)   Resp 18   Ht 5' 9"  (1.753 m)   Wt 90.4 kg   SpO2 97%   BMI 29.43 kg/m  Vital signs in last 24 hours: Temp:  [97.5 F (36.4 C)-100.9 F (38.3 C)] 100.8 F (38.2 C) (11/28 0700) Pulse Rate:  [54-84] 76 (11/28 0758) Resp:  [7-25] 18 (11/28 0758) BP: (90-187)/(64-145) 116/75 (11/28 0700) SpO2:  [92 %-100 %] 97 % (11/28 0758) FiO2 (%):  [30 %-100 %] 40 % (11/28 0758) Weight:  [90.4 kg-94 kg] 90.4 kg (11/28 0500)  Intake/Output from previous day: 11/27 0701 - 11/28 0700 In: 315.5 [I.V.:62.7; NG/GT:30; IV Piggyback:222.8] Out: 2150 [Urine:2100; Emesis/NG output:50] Intake/Output this shift: No intake/output data recorded. Nutritional status:  Diet Order             Diet NPO time specified  Diet effective now                  HEENT: No neck stiffness. Intubated.  Ext: Warm and well perfused  Neurologic Exam: Ment: Eyes closed with no opening to any stimuli. Does not respond to voice. Will localize sternal rub with LUE. When eyes are held open, does not gaze towards or away from visual stimuli. . Cranial Nerves: II:  No blink to threat. Pupils are round, exhibit hippus bilaterally and symmetrically varying from 4-5 mm in size, but no reactivity to light.   III,IV, VI: Absent oculocephalic reflex. Eyes are midline. No nystagmus.  and no gag. Cough reflex is present and furrows brow to pain. V: Asymmetrically weak corneal reflexes, weaker on the right VII: Face flaccidly symmetric in lower quadrants. Furrows brow to pain.  VIII: No responses to auditory stimuli IX,X: No gag reflex. Cough reflex intact.  XI: Head is midline XII: Unable to assess Motor/Sensory: Localizes with LUE to sternal rub. Moves LUE to pinch.  Minimal movement of RUE 1-2/5; decreased tone. No movement to pinch.  Withdraws LLE to plantar  stimulation, 3/5 Minimal movement of RLE to plantar stimulation in conjunction with increased tone.  Deep Tendon Reflexes:  Low amplitude reflexes x 4.  Plantars: Right: Upgoing   Left: Downgoing Cerebellar/Gait: Unable to assess   Lab Results: Results for orders placed or performed during the hospital encounter of 01/20/2021 (from the past 48 hour(s))  Comprehensive metabolic panel     Status: Abnormal   Collection Time: 01/20/2021  5:25 PM  Result Value Ref Range   Sodium 135 135 - 145 mmol/L   Potassium 4.6 3.5 - 5.1 mmol/L   Chloride 101 98 - 111 mmol/L   CO2 21 (L) 22 - 32 mmol/L   Glucose, Bld 206 (H) 70 - 99 mg/dL    Comment: Glucose reference range applies only to samples taken after fasting for at least 8 hours.   BUN 14 8 - 23 mg/dL   Creatinine, Ser 0.97 0.61 - 1.24 mg/dL   Calcium 8.9 8.9 - 10.3 mg/dL   Total Protein 6.3 (L) 6.5 - 8.1 g/dL   Albumin 3.8 3.5 - 5.0 g/dL   AST 29 15 - 41 U/L   ALT 17 0 - 44 U/L   Alkaline Phosphatase 56 38 - 126 U/L   Total Bilirubin 1.1 0.3 - 1.2 mg/dL   GFR, Estimated >60 >60 mL/min    Comment: (NOTE) Calculated using the CKD-EPI Creatinine  Equation (2021)    Anion gap 13 5 - 15    Comment: Performed at [facility], [place] [address] [place], [place] [address]  CBC     Status: Abnormal   Collection Time: 01/17/2021  5:25 PM  Result Value Ref Range   WBC 7.1 4.0 - 10.5 K/uL   RBC 5.89 (H) 4.22 - 5.81 MIL/uL   Hemoglobin 17.0 13.0 - 17.0 g/dL   HCT 52.4 (H) 39.0 - 52.0 %   MCV 89.0 80.0 - 100.0 fL   MCH 28.9 26.0 - 34.0 pg   MCHC 32.4 30.0 - 36.0 g/dL   RDW 13.1 11.5 - 15.5 %   Platelets 170 150 - 400 K/uL   nRBC 0.0 0.0 - 0.2 %    Comment: Performed at [facility], [place] [address] [place], [place] [address]  Ethanol     Status: None   Collection Time: 02/03/2021  5:25 PM  Result Value Ref Range   Alcohol, Ethyl (B) <10 <10 mg/dL    Comment: (NOTE) Lowest detectable limit for serum alcohol is 10 mg/dL.  For medical  purposes only. Performed at [facility], [place] [address] [place], [place] [address]   Lactic acid, plasma     Status: Abnormal   Collection Time: 02/08/2021  5:25 PM  Result Value Ref Range   Lactic Acid, Venous 2.4 (HH) 0.5 - 1.9 mmol/L    Comment: CRITICAL RESULT CALLED TO, READ BACK BY AND VERIFIED WITH: A RAND RN BY SSTEPHENS [phone] [id number] Performed at [facility], [place] [address], [place], [place] [address]   Resp Panel by RT-PCR (Flu A&B, Covid) Nasopharyngeal Swab     Status: None   Collection Time: 01/15/2021  5:33 PM   Specimen: Nasopharyngeal Swab; Nasopharyngeal(NP) swabs in vial transport medium  Result Value Ref Range   SARS Coronavirus 2 by RT PCR NEGATIVE NEGATIVE    Comment: (NOTE) SARS-CoV-2 target nucleic acids are NOT DETECTED.  The SARS-CoV-2 RNA is generally detectable in upper respiratory specimens during the acute phase of infection. The lowest concentration of SARS-CoV-2 viral copies this assay can detect is 138 copies/mL. A negative result does not preclude SARS-Cov-2 infection and should not be used as the sole basis for treatment or other patient management decisions. A negative result may occur with  improper specimen collection/handling, submission of specimen other than nasopharyngeal swab, presence of viral mutation(s) within the areas targeted by this assay, and inadequate number of viral copies(<138 copies/mL). A negative result must be combined with clinical observations, patient history, and epidemiological information. The expected result is Negative.  Fact Sheet for Patients:  EntrepreneurPulse.com.au  Fact Sheet for Healthcare Providers:  IncredibleEmployment.be  This test is no t yet approved or cleared by the [place] FDA and  has been authorized for detection and/or diagnosis of SARS-CoV-2 by FDA under an Emergency Use Authorization (EUA). This EUA will remain  in effect (meaning this  test can be used) for the duration of the COVID-19 declaration under Section 564(b)(1) of the Act, 21 U.S.C.section 360bbb-3(b)(1), unless the authorization is terminated  or revoked sooner.       Influenza A by PCR NEGATIVE NEGATIVE   Influenza B by PCR NEGATIVE NEGATIVE    Comment: (NOTE) The Xpert Xpress SARS-CoV-2/FLU/RSV plus assay is intended as an aid in the diagnosis of influenza from Nasopharyngeal swab specimens and should not be used as a sole basis for treatment. Nasal washings and aspirates are  unacceptable for Xpert Xpress SARS-CoV-2/FLU/RSV testing.  Fact Sheet for Patients: EntrepreneurPulse.com.au  Fact Sheet for Healthcare Providers: IncredibleEmployment.be  This test is not yet approved or cleared by the [place] FDA and has been authorized for detection and/or diagnosis of SARS-CoV-2 by FDA under an Emergency Use Authorization (EUA). This EUA will remain in effect (meaning this test can be used) for the duration of the COVID-19 declaration under Section 564(b)(1) of the Act, 21 U.S.C. section 360bbb-3(b)(1), unless the authorization is terminated or revoked.  Performed at [facility], [place] [address], [place], [place] [address]   I-Stat Chem 8, ED     Status: Abnormal   Collection Time: 01/10/2021  5:36 PM  Result Value Ref Range   Sodium 135 135 - 145 mmol/L   Potassium 6.4 (HH) 3.5 - 5.1 mmol/L   Chloride 102 98 - 111 mmol/L   BUN 20 8 - 23 mg/dL   Creatinine, Ser 0.80 0.61 - 1.24 mg/dL   Glucose, Bld 202 (H) 70 - 99 mg/dL    Comment: Glucose reference range applies only to samples taken after fasting for at least 8 hours.   Calcium, Ion 1.04 (L) 1.15 - 1.40 mmol/L   TCO2 26 22 - 32 mmol/L   Hemoglobin 17.7 (H) 13.0 - 17.0 g/dL   HCT 52.0 39.0 - 52.0 %   Comment NOTIFIED PHYSICIAN   I-Stat arterial blood gas, ED     Status: Abnormal   Collection Time: 01/18/2021  6:37 PM  Result Value Ref Range   pH,  Arterial 7.450 7.350 - 7.450   pCO2 arterial 34.6 32.0 - 48.0 mmHg   pO2, Arterial 331 (H) 83.0 - 108.0 mmHg   Bicarbonate 24.2 20.0 - 28.0 mmol/L   TCO2 25 22 - 32 mmol/L   O2 Saturation 100.0 %   Acid-Base Excess 1.0 0.0 - 2.0 mmol/L   Sodium 137 135 - 145 mmol/L   Potassium 4.1 3.5 - 5.1 mmol/L   Calcium, Ion 1.20 1.15 - 1.40 mmol/L   HCT 48.0 39.0 - 52.0 %   Hemoglobin 16.3 13.0 - 17.0 g/dL   Patient temperature 97.5 F    Collection site RADIAL, ALLEN'S TEST ACCEPTABLE    Drawn by Operator    Sample type ARTERIAL   Urinalysis, Routine w reflex microscopic     Status: Abnormal   Collection Time: 02/03/2021  8:16 PM  Result Value Ref Range   Color, Urine STRAW (A) YELLOW   APPearance CLEAR CLEAR   Specific Gravity, Urine 1.015 1.005 - 1.030   pH 5.0 5.0 - 8.0   Glucose, UA >=500 (A) NEGATIVE mg/dL   Hgb urine dipstick SMALL (A) NEGATIVE   Bilirubin Urine NEGATIVE NEGATIVE   Ketones, ur 5 (A) NEGATIVE mg/dL   Protein, ur NEGATIVE NEGATIVE mg/dL   Nitrite NEGATIVE NEGATIVE   Leukocytes,Ua NEGATIVE NEGATIVE   RBC / HPF 0-5 0 - 5 RBC/hpf   WBC, UA 0-5 0 - 5 WBC/hpf   Bacteria, UA NONE SEEN NONE SEEN   Squamous Epithelial / LPF 0-5 0 - 5    Comment: Performed at [facility], [address] [place], [place] [address]  Rapid urine drug screen (hospital performed)     Status: None   Collection Time: 01/22/2021  8:16 PM  Result Value Ref Range   Opiates NONE DETECTED NONE DETECTED   Cocaine NONE DETECTED NONE DETECTED   Benzodiazepines NONE DETECTED NONE DETECTED   Amphetamines NONE DETECTED NONE DETECTED  Tetrahydrocannabinol NONE DETECTED NONE DETECTED   Barbiturates NONE DETECTED NONE DETECTED    Comment: (NOTE) DRUG SCREEN FOR MEDICAL PURPOSES ONLY.  IF CONFIRMATION IS NEEDED FOR ANY PURPOSE, NOTIFY LAB WITHIN 5 DAYS.  LOWEST DETECTABLE LIMITS FOR URINE DRUG SCREEN Drug Class                     Cutoff (ng/mL) Amphetamine and metabolites    1000 Barbiturate and  metabolites    200 Benzodiazepine                 378 Tricyclics and metabolites     300 Opiates and metabolites        300 Cocaine and metabolites        300 THC                            50 Performed at [facility], [place] [address] [place], [place] [address]   MRSA Next Gen by PCR, Nasal     Status: None   Collection Time: 02/08/2021  9:00 PM   Specimen: Nasal Mucosa; Nasal Swab  Result Value Ref Range   MRSA by PCR Next Gen NOT DETECTED NOT DETECTED    Comment: (NOTE) The GeneXpert MRSA Assay (FDA approved for NASAL specimens only), is one component of a comprehensive MRSA colonization surveillance program. It is not intended to diagnose MRSA infection nor to guide or monitor treatment for MRSA infections. Test performance is not FDA approved in patients less than 58 years old. Performed at [facility], [place] [address] [place], [place] [address]   Sample to Blood Bank     Status: None   Collection Time: 01/12/2021  9:11 PM  Result Value Ref Range   Blood Bank Specimen SAMPLE AVAILABLE FOR TESTING    Sample Expiration      02/06/2021,2359 Performed at [facility], [place] [address], [place], [place] [address]   Glucose, capillary     Status: Abnormal   Collection Time: 01/30/2021  9:14 PM  Result Value Ref Range   Glucose-Capillary 220 (H) 70 - 99 mg/dL    Comment: Glucose reference range applies only to samples taken after fasting for at least 8 hours.  Protime-INR     Status: None   Collection Time: 01/14/2021  9:28 PM  Result Value Ref Range   Prothrombin Time 12.9 11.4 - 15.2 seconds   INR 1.0 0.8 - 1.2    Comment: (NOTE) INR goal varies based on device and disease states. Performed at [facility], [place] [address], [place], [place] [address]   HIV Antibody (routine testing w rflx)     Status: None   Collection Time: 01/25/2021  9:28 PM  Result Value Ref Range   HIV Screen 4th Generation wRfx Non Reactive Non Reactive    Comment: Performed at  [facility], [place] [address] [place] [address]  CBC     Status: Abnormal   Collection Time: 02/04/2021  9:28 PM  Result Value Ref Range   WBC 6.9 4.0 - 10.5 K/uL   RBC 5.90 (H) 4.22 - 5.81 MIL/uL   Hemoglobin 17.6 (H) 13.0 - 17.0 g/dL   HCT 50.1 39.0 - 52.0 %   MCV 84.9 80.0 - 100.0 fL   MCH 29.8 26.0 - 34.0 pg   MCHC 35.1 30.0 - 36.0 g/dL   RDW 13.1 11.5 -  15.5 %   Platelets 170 150 - 400 K/uL   nRBC 0.0 0.0 - 0.2 %    Comment: Performed at [facility], [place] [address], [place], [place] [address]  Culture, blood (routine x 2)     Status: None (Preliminary result)   Collection Time: 01/13/2021  9:28 PM   Specimen: BLOOD LEFT HAND  Result Value Ref Range   Specimen Description BLOOD LEFT HAND    Special Requests AEROBIC BOTTLE ONLY Blood Culture adequate volume    Culture      NO GROWTH < 12 HOURS Performed at [facility] [address], [place], [place] [address]    Report Status PENDING   Basic metabolic panel     Status: Abnormal   Collection Time: 01/20/2021  9:28 PM  Result Value Ref Range   Sodium 134 (L) 135 - 145 mmol/L   Potassium 3.8 3.5 - 5.1 mmol/L   Chloride 100 98 - 111 mmol/L   CO2 23 22 - 32 mmol/L   Glucose, Bld 203 (H) 70 - 99 mg/dL    Comment: Glucose reference range applies only to samples taken after fasting for at least 8 hours.   BUN 13 8 - 23 mg/dL   Creatinine, Ser 0.96 0.61 - 1.24 mg/dL   Calcium 8.8 (L) 8.9 - 10.3 mg/dL   GFR, Estimated >60 >60 mL/min    Comment: (NOTE) Calculated using the CKD-EPI Creatinine Equation (2021)    Anion gap 11 5 - 15    Comment: Performed at [place] [address] [place], [place] [address]  Magnesium     Status: None   Collection Time: 01/15/2021  9:28 PM  Result Value Ref Range   Magnesium 2.2 1.7 - 2.4 mg/dL    Comment: Performed at [facility], [place] [address], [place], [place] [address]  Culture, blood (routine x 2)     Status: None (Preliminary result)   Collection  Time: 01/11/2021  9:31 PM   Specimen: BLOOD RIGHT HAND  Result Value Ref Range   Specimen Description BLOOD RIGHT HAND    Special Requests      BOTTLES DRAWN AEROBIC AND ANAEROBIC Blood Culture adequate volume   Culture      NO GROWTH < 12 HOURS Performed at [facility], [place] [address] [place], [place] [address]    Report Status PENDING   Troponin I (High Sensitivity)     Status: Abnormal   Collection Time: 01/11/2021 11:17 PM  Result Value Ref Range   Troponin I (High Sensitivity) 46 (H) <18 ng/L    Comment: (NOTE) Elevated high sensitivity troponin I (hsTnI) values and significant  changes across serial measurements may suggest ACS but many other  chronic and acute conditions are known to elevate hsTnI results.  Refer to the "Links" section for chest pain algorithms and additional  guidance. Performed at [facility], [place] [address] [place], [place] [address]   Ammonia     Status: None   Collection Time: 01/11/2021 11:17 PM  Result Value Ref Range   Ammonia 16 9 - 35 umol/L    Comment: Performed at [facility], [place] [address] [place], [place] [address]  Ethanol     Status: None   Collection Time: 01/30/2021 11:17 PM  Result Value Ref Range   Alcohol, Ethyl (B) <10 <10 mg/dL    Comment: (NOTE) Lowest detectable limit for serum alcohol is 10 mg/dL.  For medical purposes only. Performed at [facility], [place] [address] [place], [place] [address]   TSH     Status: None   Collection Time: 02/08/2021 11:17 PM  Result Value Ref Range   TSH 1.664 0.350 - 4.500 uIU/mL    Comment: Performed by a 3rd Generation assay with a functional sensitivity of <=0.01 uIU/mL. Performed at [facility], [place] [address] [place], [place] [address]   Vitamin B12     Status: None   Collection Time: 02/06/2021 11:17 PM  Result Value Ref Range   Vitamin B-12 260 180 - 914 pg/mL    Comment: (NOTE) This assay is not validated for testing neonatal or myeloproliferative  syndrome specimens for Vitamin B12 levels. Performed at [facility], [place] [address] [place], [place] [address]   Folate     Status: None   Collection Time: 01/22/2021 11:17 PM  Result Value Ref Range   Folate 12.9 >5.9 ng/mL    Comment: Performed at [place] [address], [place], [place] [address]  Glucose, capillary     Status: Abnormal   Collection Time: 01/19/2021 11:17 PM  Result Value Ref Range   Glucose-Capillary 192 (H) 70 - 99 mg/dL    Comment: Glucose reference range applies only to samples taken after fasting for at least 8 hours.  Cooxemetry Panel (carboxy, met, total hgb, O2 sat)     Status: Abnormal   Collection Time: 01/28/2021 11:35 PM  Result Value Ref Range   Total hemoglobin 18.2 (H) 12.0 - 16.0 g/dL   O2 Saturation 58.8 %   Carboxyhemoglobin 1.1 0.5 - 1.5 %   Methemoglobin 0.9 0.0 - 1.5 %    Comment: Performed at [facility] [address] [place], [place] [address]  Glucose, capillary     Status: Abnormal   Collection Time: 02/06/21  3:43 AM  Result Value Ref Range   Glucose-Capillary 180 (H) 70 - 99 mg/dL    Comment: Glucose reference range applies only to samples taken after fasting for at least 8 hours.  CBC     Status: Abnormal   Collection Time: 02/06/21  4:13 AM  Result Value Ref Range   WBC 8.4 4.0 - 10.5 K/uL   RBC 5.92 (H) 4.22 - 5.81 MIL/uL   Hemoglobin 17.3 (H) 13.0 - 17.0 g/dL   HCT 51.9 39.0 - 52.0 %   MCV 87.7 80.0 - 100.0 fL   MCH 29.2 26.0 - 34.0 pg   MCHC 33.3 30.0 - 36.0 g/dL   RDW 13.3 11.5 - 15.5 %   Platelets 184 150 - 400 K/uL   nRBC 0.0 0.0 - 0.2 %    Comment: Performed at [facility], [place] [address] [place], [place] [address]  Basic metabolic panel     Status: Abnormal   Collection Time: 02/06/21  4:13 AM  Result Value Ref Range   Sodium 137 135 - 145 mmol/L   Potassium 3.8 3.5 - 5.1 mmol/L   Chloride 102 98 - 111 mmol/L   CO2 24 22 - 32 mmol/L   Glucose, Bld 168 (H) 70 - 99 mg/dL    Comment:  Glucose reference range applies only to samples taken after fasting for at least 8 hours.   BUN 14 8 - 23 mg/dL   Creatinine, Ser 0.90 0.61 - 1.24 mg/dL   Calcium 8.9 8.9 - 10.3 mg/dL   GFR, Estimated >60 >60 mL/min  Comment: (NOTE) Calculated using the CKD-EPI Creatinine Equation (2021)    Anion gap 11 5 - 15    Comment: Performed at [facility], [place] [address], [place], [place] [address]  Magnesium     Status: None   Collection Time: 02/06/21  4:13 AM  Result Value Ref Range   Magnesium 2.1 1.7 - 2.4 mg/dL    Comment: Performed at [facility] [address], [place], [place] [address]  Phosphorus     Status: None   Collection Time: 02/06/21  4:13 AM  Result Value Ref Range   Phosphorus 2.5 2.5 - 4.6 mg/dL    Comment: Performed at [facility] [address] [place], [place] [address]  Lactic acid, plasma     Status: Abnormal   Collection Time: 02/06/21  4:13 AM  Result Value Ref Range   Lactic Acid, Venous 2.4 (HH) 0.5 - 1.9 mmol/L    Comment: CRITICAL VALUE NOTED.  VALUE IS CONSISTENT WITH PREVIOUSLY REPORTED AND CALLED VALUE. Performed at [facility], [place] [address] [place], [place] [address]   Troponin I (High Sensitivity)     Status: Abnormal   Collection Time: 02/06/21  4:13 AM  Result Value Ref Range   Troponin I (High Sensitivity) 29 (H) <18 ng/L    Comment: (NOTE) Elevated high sensitivity troponin I (hsTnI) values and significant  changes across serial measurements may suggest ACS but many other  chronic and acute conditions are known to elevate hsTnI results.  Refer to the "Links" section for chest pain algorithms and additional  guidance. Performed at [facility], [place] [address], [place], [place] [address]   Triglycerides     Status: Abnormal   Collection Time: 02/06/21  4:13 AM  Result Value Ref Range   Triglycerides 200 (H) <150 mg/dL    Comment: Performed at [facility] [address], [place], [place]  [address]    Recent Results (from the past 240 hour(s))  Resp Panel by RT-PCR (Flu A&B, Covid) Nasopharyngeal Swab     Status: None   Collection Time: 01/29/2021  5:33 PM   Specimen: Nasopharyngeal Swab; Nasopharyngeal(NP) swabs in vial transport medium  Result Value Ref Range Status   SARS Coronavirus 2 by RT PCR NEGATIVE NEGATIVE Final    Comment: (NOTE) SARS-CoV-2 target nucleic acids are NOT DETECTED.  The SARS-CoV-2 RNA is generally detectable in upper respiratory specimens during the acute phase of infection. The lowest concentration of SARS-CoV-2 viral copies this assay can detect is 138 copies/mL. A negative result does not preclude SARS-Cov-2 infection and should not be used as the sole basis for treatment or other patient management decisions. A negative result may occur with  improper specimen collection/handling, submission of specimen other than nasopharyngeal swab, presence of viral mutation(s) within the areas targeted by this assay, and inadequate number of viral copies(<138 copies/mL). A negative result must be combined with clinical observations, patient history, and epidemiological information. The expected result is Negative.  Fact Sheet for Patients:  EntrepreneurPulse.com.au  Fact Sheet for Healthcare Providers:  IncredibleEmployment.be  This test is no t yet approved or cleared by the [place] FDA and  has been authorized for detection and/or diagnosis of SARS-CoV-2 by FDA under an Emergency Use Authorization (EUA). This EUA will remain  in effect (meaning this test can be used) for the duration of the COVID-19 declaration under Section 564(b)(1) of the Act, 21 U.S.C.section 360bbb-3(b)(1), unless the authorization  is terminated  or revoked sooner.       Influenza A by PCR NEGATIVE NEGATIVE Final   Influenza B by PCR NEGATIVE NEGATIVE Final    Comment: (NOTE) The Xpert Xpress SARS-CoV-2/FLU/RSV plus assay is intended  as an aid in the diagnosis of influenza from Nasopharyngeal swab specimens and should not be used as a sole basis for treatment. Nasal washings and aspirates are unacceptable for Xpert Xpress SARS-CoV-2/FLU/RSV testing.  Fact Sheet for Patients: EntrepreneurPulse.com.au  Fact Sheet for Healthcare Providers: IncredibleEmployment.be  This test is not yet approved or cleared by the [place] FDA and has been authorized for detection and/or diagnosis of SARS-CoV-2 by FDA under an Emergency Use Authorization (EUA). This EUA will remain in effect (meaning this test can be used) for the duration of the COVID-19 declaration under Section 564(b)(1) of the Act, 21 U.S.C. section 360bbb-3(b)(1), unless the authorization is terminated or revoked.  Performed at [facility], [place] [address] [place], [place] [address]   MRSA Next Gen by PCR, Nasal     Status: None   Collection Time: 01/29/2021  9:00 PM   Specimen: Nasal Mucosa; Nasal Swab  Result Value Ref Range Status   MRSA by PCR Next Gen NOT DETECTED NOT DETECTED Final    Comment: (NOTE) The GeneXpert MRSA Assay (FDA approved for NASAL specimens only), is one component of a comprehensive MRSA colonization surveillance program. It is not intended to diagnose MRSA infection nor to guide or monitor treatment for MRSA infections. Test performance is not FDA approved in patients less than 16 years old. Performed at [facility], [place] [address] [place], [place] [address]   Culture, blood (routine x 2)     Status: None (Preliminary result)   Collection Time: 02/03/2021  9:28 PM   Specimen: BLOOD LEFT HAND  Result Value Ref Range Status   Specimen Description BLOOD LEFT HAND  Final   Special Requests AEROBIC BOTTLE ONLY Blood Culture adequate volume  Final   Culture   Final    NO GROWTH < 12 HOURS Performed at [facility], [place] [address] [place], [place] [address]    Report Status  PENDING  Incomplete  Culture, blood (routine x 2)     Status: None (Preliminary result)   Collection Time: 01/11/2021  9:31 PM   Specimen: BLOOD RIGHT HAND  Result Value Ref Range Status   Specimen Description BLOOD RIGHT HAND  Final   Special Requests   Final    BOTTLES DRAWN AEROBIC AND ANAEROBIC Blood Culture adequate volume   Culture   Final    NO GROWTH < 12 HOURS Performed at [facility], [place] [address], [place], [place] [address]    Report Status PENDING  Incomplete    Lipid Panel Recent Labs    02/06/21 0413  TRIG 200*    Studies/Results: CT ANGIO HEAD NECK W WO CM  Result Date: 01/20/2021 CLINICAL DATA:  "Basilar thrombosis" EXAM: CT ANGIOGRAPHY HEAD AND NECK TECHNIQUE: Multidetector CT imaging of the head and neck was performed using the standard protocol during bolus administration of intravenous contrast. Multiplanar CT image reconstructions and MIPs were obtained to evaluate the vascular anatomy. Carotid stenosis measurements (when applicable) are obtained utilizing NASCET criteria, using the distal internal carotid diameter as the denominator. CONTRAST:  132m OMNIPAQUE IOHEXOL 350 MG/ML SOLN COMPARISON:  CTA 08/11/2019, correlation is also made with same day CT head. FINDINGS: CT HEAD FINDINGS For noncontrast findings, please see  same day CT head. CTA NECK FINDINGS Aortic arch: Standard branching. Imaged portion shows no evidence of aneurysm or dissection. No significant stenosis of the major arch vessel origins. Aortic atherosclerosis. Right carotid system: No evidence of dissection, stenosis (50% or greater) or occlusion. Left carotid system: No evidence of dissection, stenosis (50% or greater) or occlusion. Vertebral arteries: Focal narrowing just distal to the takeoff of the right vertebral artery (series 7, image 575), unchanged. The right vertebral artery is otherwise patent. 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 Skeleton: No acute osseous abnormality. Other neck: Endotracheal tube and orogastric tube noted. Otherwise negative. Upper chest: No focal pulmonary opacity or pleural effusion. Debris in the trachea and right mainstem bronchus. Review of the MIP images confirms the above findings CTA HEAD FINDINGS Anterior circulation: Both internal carotid arteries are patent to the termini, without stenosis or other abnormality. A1 segments patent. The anterior communicating artery is not visualized. Anterior cerebral arteries are noted at patent to their distal aspects. No M1 stenosis or occlusion. Normal MCA bifurcations. Focal stenosis in a right M2 branch (series 7, images 230), which is new from the prior exam. MCA branches otherwise perfused and symmetric. Posterior circulation: Reconstitution of flow in the distal left V3 and V4 segments, overall unchanged. The right vertebral artery is patent to the vertebrobasilar junction. Posterior inferior cerebral arteries patent bilaterally. Basilar is irregular but patent to its distal aspect. Superior cerebellar arteries patent bilaterally. Near fetal origin of the left PCA, with focal narrowing of the distal left posterior communicating artery proximal to the P1-P2 junction (series 7, image 241), which is new from the prior exam. Redemonstrated focal stenosis in the left P2 segment (series 7, image 235). The left PCA is otherwise perfused although it is irregular. Normal right PCA. The right posterior communicating artery is not visualized. Venous sinuses: As permitted by contrast timing, patent. Anatomic variants: None significant Review of the MIP images confirms the above findings IMPRESSION: 1. New focal stenosis in a right M2 branch, with otherwise patent anterior circulation. 2. Focal narrowing of the distal left posterior communicating artery, which is new from prior exam,  with redemonstrated focal stenosis in the left P2 segment. The remainder of the left PCA is perfused but irregular. 3. 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 4. Debris in the trachea and right mainstem bronchus. 5. Redemonstrated basilar irregularity, with no evidence of basilar thrombosis. Electronically Signed   By: Merilyn Baba M.D.   On: 01/25/2021 21:38   DG Abdomen 1 View  Result Date: 01/10/2021 CLINICAL DATA:  Post intubation. EXAM: PORTABLE CHEST 1 VIEW, portable two views abdomen. COMPARISON:  Chest x-ray 08/11/2019. FINDINGS: Endotracheal tube tip is 2.5 cm above the carina. Enteric tube tip is at the level of the proximal duodenum. VP shunt overlies the right chest and abdomen. Distal tip is in the left lower quadrant. Visualized portion of the catheter appears intact. The lungs are clear. The cardiomediastinal silhouette is within normal limits. There is no pleural effusion or pneumothorax. Small calcified nodule seen in the right upper lobe. Bowel-gas pattern is normal. There are no suspicious calcifications. No acute fractures are seen. IMPRESSION: 1. Lines and tubes as above. 2. Lungs are clear. 3. Normal bowel-gas  pattern. Electronically Signed   By: Ronney Asters M.D.   On: 02/06/2021 18:44   CT HEAD WO CONTRAST  Result Date: 01/30/2021 CLINICAL DATA:  Trauma. EXAM: CT HEAD WITHOUT CONTRAST CT CERVICAL SPINE WITHOUT CONTRAST TECHNIQUE: Multidetector CT imaging of the head and cervical spine was performed following the standard protocol without intravenous contrast. Multiplanar CT image reconstructions of the cervical spine were also generated. COMPARISON:  CT head 01/16/2021.  MRI brain 08/12/2019. FINDINGS: CT HEAD FINDINGS Brain: No evidence of acute infarction, hemorrhage, extra-axial collection or mass lesion/mass effect. Mild diffuse atrophy is unchanged.  Right frontal ventriculostomy catheter is stable in position ending in the frontal horn of the right lateral ventricle. Ventricular size is mildly dilated and unchanged. There is stable mild periventricular white matter hypodensity, likely chronic small vessel ischemic change. 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 Vascular: No hyperdense vessel or unexpected calcification. Skull: Normal. Negative for fracture or focal lesion. Sinuses/Orbits: No acute finding. Other: None. CT CERVICAL SPINE FINDINGS Alignment: Normal. Skull base and vertebrae: No acute fracture. No primary bone lesion or focal pathologic process. Soft tissues and spinal canal: No prevertebral fluid or swelling. No visible canal hematoma. Disc levels: There is mild disc space narrowing and endplate osteophyte formation compatible with degenerative change at C4-C5. No significant central canal or neural foraminal stenosis at any level. Upper chest: Negative. Other: There is fluid in the nasopharynx. Enteric tube is present. Patient is intubated. IMPRESSION: 1.  No acute intracranial process. 2. No acute fracture or traumatic subluxation of the cervical spine. 3.  VP shunt is unchanged in position.  Ventricular size is stable. Electronically Signed   By: Ronney Asters M.D.   On: 01/14/2021 18:04   CT CERVICAL SPINE WO CONTRAST  Result Date: 01/19/2021 CLINICAL DATA:  Trauma. EXAM: CT HEAD WITHOUT CONTRAST CT CERVICAL SPINE WITHOUT CONTRAST TECHNIQUE: Multidetector CT imaging of the head and cervical spine was performed following the standard protocol without intravenous contrast. Multiplanar CT image reconstructions of the cervical spine were also generated. COMPARISON:  CT head 01/16/2021.  MRI brain 08/12/2019. FINDINGS: CT HEAD FINDINGS Brain: No evidence of acute infarction, hemorrhage, extra-axial collection or mass lesion/mass effect. Mild diffuse atrophy is unchanged. Right frontal ventriculostomy catheter is  stable in position ending in the frontal horn of the right lateral ventricle. Ventricular size is mildly dilated and unchanged. There is stable mild periventricular white matter hypodensity, likely chronic small vessel ischemic change. 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 Vascular: No hyperdense vessel or unexpected calcification. Skull: Normal. Negative for fracture or focal lesion. Sinuses/Orbits: No acute finding. Other: None. CT CERVICAL SPINE FINDINGS Alignment: Normal. Skull base and vertebrae: No acute fracture. No primary bone lesion or focal pathologic process. Soft tissues and spinal canal: No prevertebral fluid or swelling. No visible canal hematoma. Disc levels: There is mild disc space narrowing and endplate osteophyte formation compatible with degenerative change at C4-C5. No significant central canal or neural foraminal stenosis at any level. Upper chest: Negative. Other: There is fluid in the nasopharynx. Enteric tube is present. Patient is intubated. IMPRESSION: 1.  No acute intracranial process. 2. No acute fracture or traumatic subluxation of the cervical spine. 3.  VP shunt is unchanged in position.  Ventricular size is stable. Electronically Signed   By: Ronney Asters M.D.   On: 01/26/2021 18:04   DG Pelvis Portable  Result Date: 01/15/2021 CLINICAL DATA:  Shunt, fall. EXAM: PORTABLE  PELVIS 1-2 VIEWS COMPARISON:  Pelvis x-ray 07/14/2018. FINDINGS: There is no evidence of pelvic fracture or diastasis. No pelvic bone lesions are seen. IMPRESSION: Negative. Electronically Signed   By: Ronney Asters M.D.   On: 01/23/2021 18:44   DG Chest Portable 1 View  Result Date: 01/11/2021 CLINICAL DATA:  Post intubation. EXAM: PORTABLE CHEST 1 VIEW, portable two views abdomen. COMPARISON:  Chest x-ray 08/11/2019. FINDINGS: Endotracheal tube tip is 2.5 cm above the carina. Enteric tube tip is at the level of the proximal duodenum. VP shunt overlies the right chest and  abdomen. Distal tip is in the left lower quadrant. Visualized portion of the catheter appears intact. The lungs are clear. The cardiomediastinal silhouette is within normal limits. There is no pleural effusion or pneumothorax. Small calcified nodule seen in the right upper lobe. Bowel-gas pattern is normal. There are no suspicious calcifications. No acute fractures are seen. IMPRESSION: 1. Lines and tubes as above. 2. Lungs are clear. 3. Normal bowel-gas pattern. Electronically Signed   By: Ronney Asters M.D.   On: 01/14/2021 18:44    Medications: Scheduled:  chlorhexidine gluconate (MEDLINE KIT)  15 mL Mouth Rinse BID   Chlorhexidine Gluconate Cloth  6 each Topical Daily   docusate  100 mg Per Tube BID   heparin  5,000 Units Subcutaneous Q8H   insulin aspart  0-15 Units Subcutaneous Q4H   mouth rinse  15 mL Mouth Rinse 10 times per day   pantoprazole (PROTONIX) IV  40 mg Intravenous QHS   polyethylene glycol  17 g Per Tube Daily   [START ON 02/14/2021] thiamine injection  100 mg Intravenous Daily   Continuous:  propofol (DIPRIVAN) infusion Stopped (02/06/21 0302)   thiamine injection 100 mL/hr at 02/06/21 0600   Followed by   Derrill Memo ON 02/08/2021] thiamine injection      Assessment: 69 y.o. 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   - His initial neuro exam is notable for patchy brainstem reflexes with nonreactive pupils, no corneal reflexes, no gag but cough was present with ability to localize with bilateral upper extremities and able to withdraw bilateral lower extremities.   - CT head and CTA were negative for ICH or any large vessel  occlusion.   - He has not had any fever and did not have any headache or other obvious signs of meningitis over the last few days.   - He is not on any significant sedating medications at home and his drug screen is negative for opioids or any other substances.  No alcohol in the blood. - TSH normal. Methemoglobin normal. HIV nonreactive. EtOH negative. UTOX negative. Folate normal. Blood cultures pending.  - Vitamin B12 level of 260 is low by neurological standards. Would not result in acute neurological decompensation but could decrease baseline neurological reserve.  - DDx for acute encephalopathy with unresponsiveness of unclear cause includes unwitnessed seizure at home followed by postictal state. This is felt to be the most likely etiology given his history of partial status epilepticus. Doubt shunt failure given intact VP shunt on CT head. Low suspicion for meningitis at this time. - Right upper and lower extremity move less than the left on exam, suggestive of a  left cerebral hemisphere lesion. Upgoing toe on the right and increased tone of RLE relative to LLE suggests chronicity.  - Pupils exhibit hippus bilaterally and symmetrically, are round and 4-5 mm in size, but no reactivity to light. Also with weak asymmetric corneal reflexes, no oculocephalic reflex and no gag. Cough reflex is present and furrows brow to pain. Continued patchy brainstem reflexes despite discontinuation of propofol overnight.  - Bedside review of LTM EEG tracings reveals diffuse slowing without epileptiform discharges. Overall appearance is most consistent with a severe encephalopathy.  - Official EEG report for this AM: Continuous generalized slowing. This study is suggestive of moderate to severe diffuse encephalopathy, nonspecific etiology.  No seizures or epileptiform discharges were seen throughout the recording.   Recommendations: - Discontinue LTM EEG  - Continue Keppra 500 mg BID, gradually switch back to  Lamictal over a course of 6 weeks when he is able to tolerate PO intake. - MRI Brain with and without contrast after LTM EEG is discontinued - Vit B1 level pending - High-dose IV thiamine  - Co-oximetry panel. - Start B12 supplementation.   35 minutes spent in the neurological evaluation and management of this critically ill patient.    LOS: 1 day   @Electronically  signed: Dr. Kerney Elbe 02/06/2021  9:05 AM

## 2021-02-06 NOTE — Progress Notes (Signed)
Discontinued cEEG study.  Notified [facility] monitoring.  Skin breakdown observed at electrode sites F7, F3, C3, Z,(forehead), F8, F4. Notified Weston Brass, RN of skin breakdown areas.

## 2021-02-06 NOTE — Procedures (Signed)
Patient Name: [name] [name].  MRN: [id number]  Epilepsy Attending: Charlsie Quest  Referring Physician/Provider: Dr Erick Blinks Date: 02-28-2021 Duration: 21.13 mins  Patient history: 70 year old male with past medical history of traumatic subdural hematomas and subarachnoid hemorrhage who presented with altered mental status.  EEG 20 for seizure.  Level of alertness: lethargic   AEDs during EEG study: LEV  Technical aspects: This EEG study was done with scalp electrodes positioned according to the 10-20 International system of electrode placement. Electrical activity was acquired at a sampling rate of 500Hz  and reviewed with a high frequency filter of 70Hz  and a low frequency filter of 1Hz . EEG data were recorded continuously and digitally stored.   Description: EEG showed continuous generalized 3 to 6 Hz theta-delta slowing admixed with 15 to 18 Hz frontocentral beta activity. Hyperventilation and photic stimulation were not performed.     ABNORMALITY - Continuous slow, generalized  IMPRESSION: This study is suggestive of moderate to severe diffuse encephalopathy, nonspecific etiology.  No seizures or epileptiform discharges were seen throughout the recording.  [name] 

## 2021-02-06 NOTE — Progress Notes (Incomplete)
*  PRELIMINARY RESULTS* Echocardiogram 2D Echocardiogram has been performed.  Carolyne Fiscal 02/06/2021, 11:13 AM

## 2021-02-06 NOTE — Progress Notes (Addendum)
NAME:  [name], [name] MRN:  [id number], DOB:  06-20-1950, LOS: 1 ADMISSION DATE:  02-21-21, CONSULTATION DATE:  11/27 REFERRING MD:  Audley Hose, CHIEF COMPLAINT:  Fall and AMS   History of Present Illness:  70 year old man who presented to the [facility] ED 11/27 as a level 1 trauma after a fall. PMHx significant for HTN, HLD, Afib (not on [facility]) CAD s/p DES to LAD (03/2014), T2DM, SDH/SAH, status epilepticus, hydrocephalus (s/p VP shunt 9/20).  Patient's LKN was ~1100 11/27, seen by his wife. He was found by his wife ~1700 between the toilet and bathtub in the bathroom and appeared unresponsive. EMS was called.  On ED arrival, initial GCS of 7. He was intubated for airway protection. Afebrile with labs notable for WBC 7.1, lactate 2.4. CT Head and C-spine were negative for intracranial process and demonstrated C-spine fracture or abnormality. VP shunt was noted to be in unchanged position with stable appearance of ventricles. Neurology was consulted. LTM EEG was initiated. PCCM was consulted for admission.  Pertinent Medical History:  SDH/SAH, status epilepticus disorder, hydrocephalus s/p VP shunt (9/20), HTN, HLD, afib, DM2. CAD post DES (01/16) x1 to LAD  Significant Hospital Events: Including procedures, antibiotic start and stop dates in addition to other pertinent events   11/27 presented to [facility] as level 1 trauma. CT Head NAICA. CTA Head/Neck with new focal stenosis M2 branch, focal narrowing of distal L PCA, unchanged L vertebral artery occlusion (V1/V2) with irregular flow in distal V3/prox V4 (retrograde flow), redemonstrated basilar irregularity, no basilar thrombosis. 11/28 LTM EEG showing moderate to severe diffuse encephalopathy (nonspecific). No seizures. Echo and MRI pending.  Interim History / Subjective:  No significant events overnight New low grade fever to Tmax 100.9 Mental status/neuro exam grossly unchanged LTM EEG without seizures MRI today to further assess, will need CSF  valve check by NSGY post-MRI  Objective   Blood pressure 116/75, pulse 76, temperature (!) 100.8 F (38.2 C), resp. rate 18, height 5\' 9"  (1.753 m), weight 90.4 kg, SpO2 97 %.    Vent Mode: PRVC FiO2 (%):  [30 %-100 %] 40 % Set Rate:  [18 bmp] 18 bmp Vt Set:  [530 mL-560 mL] 560 mL PEEP:  [5 cmH20] 5 cmH20 Plateau Pressure:  [15 cmH20-18 cmH20] 18 cmH20   Intake/Output Summary (Last 24 hours) at 02/06/2021 0829 Last data filed at 02/06/2021 0600 Gross per 24 hour  Intake 315.49 ml  Output 2150 ml  Net -1834.51 ml   Filed Weights   2021-02-21 1725 02/06/21 0500  Weight: 94 kg 90.4 kg   Physical Examination: General: Acutely ill-appearing middle aged man in NAD. HEENT: [place]/AT, anicteric sclera, L pupil 32mm, round and minimally reactive, R pupil 36mm round and minimally reactive, dry mucous membranes. ETT in place with thin clear secretions. Neuro: Stuporous. Responds to noxious stimuli. and Withdraws to pain in all 4 extremities. Not following commands. Moves LUE purposefully to noxious stimuli. Occasionally moves BLE/RUE.+Corneal and +Cough, weak gag. CV: RRR, no m/g/r. PULM: Breathing even and unlabored on vent (PEEP 5, FiO2 40%). Lung fields coarse throughout. GI: Soft, nontender, mildly distended. Hypoactive bowel sounds. Extremities: No LE edema noted. Skin: Warm/dry, no rashes.  Resolved Hospital Problem List:     Assessment & Plan:  Acute encephalopathy HX Status epilepticus HX SAH/SDH HX Hydrocephalus s/p VP shunt EtOH neg, COVID, FLU A&B neg,?seizure vs stroke vs. ?vagal on toilet leading to syncope. Troponin downtrending. Ammonia, B12, folate, TSH WNL. On Keppra. No clear infectious  source. EKG with diffuse T-wave inversions. Lactate minimally elevated. CTA Head/Neck as above. - Neuro consulted, following; appreciate assistance - AEDs per Neuro, s/p Keppra + load/Vimpat - MRI with/without contrast; will need CSF valve eval/?reset post-MRI (NSG) - LTM EEG until  discontinued - Neuroprotective measures: HOB > 30 degrees, normoglycemia, normothermia, electrolytes WNL - F/u pending BCx/UCx, consider CSF eval from shunt vs. LP in the setting of new fever if worsening neurologic status  Endotracheally intubated GCS 7, intubated for airway protection by EDP. - Continue full vent support (4-8cc/kg IBW) - Wean FiO2 for O2 sat > 90% - Daily WUA/SBT, mental status precludes any attempt at extubation at present - VAP bundle - Pulmonary hygiene - PAD protocol for sedation: Propofol and Fentanyl for goal RASS 0 to -1; limiting sedation as able so not to convolute neurologic exam  Fall ?Syncopal event vs stroke. Evaluated by Trauma on arrival and deemed most likely medical cause of AMS/unresponsiveness. CT Head/C-sine negative for acute process. - F/u pending workup (as above) - Fall precautions  Lactic acidosis Lactate 2.4, possibly from metformin. - Trend LA to normal - Fluid resuscitation as indicated  T2DM Suspect stress BG-206 - CBGs Q4H - Goal CBG 140-180 - SSI PRN  HX Afib HX CAD post DES (01/16) x1 to LAD HX HTN HX HLD Does not appear to be on AC. NSR on exam. Trop mildly elevated, now downtrending. - F/u Echo 11/28 (read pending) - Hold home antihypertensives, resume as clinically appropriate - ASA/Statin per Neuro - Lovenox for DVT ppx  Best Practice (right click and "Reselect all SmartList Selections" daily)   Diet/type: NPO w/ meds via tube DVT prophylaxis: SCD, heparin ppx scheduled to start 11/28 at 1000.  GI prophylaxis: PPI Lines: N/A Foley:  N/A Code Status:  full code Last date of multidisciplinary goals of care discussion [11/27, discussed with wife via phone]  Critical care time: 82 minutes   Lestine Mount, PA-C [facility] 02/06/21 8:29 AM  Please see [email or web address] for pager details.  From 7A-7P if no response, please call [phone] After hours, please call ELink [phone]

## 2021-02-06 NOTE — Progress Notes (Signed)
This pt has an MR conditional programmable CSF valve. Per safety recommendations, the valve needs to be checked by neurosurgery post scan. Ordering PA aware.

## 2021-02-07 ENCOUNTER — Inpatient Hospital Stay (HOSPITAL_COMMUNITY): Payer: Medicare HMO

## 2021-02-07 DIAGNOSIS — R401 Stupor: Secondary | ICD-10-CM

## 2021-02-07 DIAGNOSIS — W19XXXS Unspecified fall, sequela: Secondary | ICD-10-CM

## 2021-02-07 DIAGNOSIS — R4182 Altered mental status, unspecified: Secondary | ICD-10-CM | POA: Diagnosis not present

## 2021-02-07 DIAGNOSIS — Z8679 Personal history of other diseases of the circulatory system: Secondary | ICD-10-CM

## 2021-02-07 DIAGNOSIS — J9601 Acute respiratory failure with hypoxia: Secondary | ICD-10-CM

## 2021-02-07 DIAGNOSIS — D751 Secondary polycythemia: Secondary | ICD-10-CM

## 2021-02-07 DIAGNOSIS — J9690 Respiratory failure, unspecified, unspecified whether with hypoxia or hypercapnia: Secondary | ICD-10-CM

## 2021-02-07 DIAGNOSIS — G9341 Metabolic encephalopathy: Secondary | ICD-10-CM

## 2021-02-07 DIAGNOSIS — Z982 Presence of cerebrospinal fluid drainage device: Secondary | ICD-10-CM

## 2021-02-07 LAB — GLUCOSE, CAPILLARY
Glucose-Capillary: 210 mg/dL — ABNORMAL HIGH (ref 70–99)
Glucose-Capillary: 199 mg/dL — ABNORMAL HIGH (ref 70–99)
Glucose-Capillary: 181 mg/dL — ABNORMAL HIGH (ref 70–99)
Glucose-Capillary: 178 mg/dL — ABNORMAL HIGH (ref 70–99)
Glucose-Capillary: 135 mg/dL — ABNORMAL HIGH (ref 70–99)
Glucose-Capillary: 164 mg/dL — ABNORMAL HIGH (ref 70–99)
Glucose-Capillary: 220 mg/dL — ABNORMAL HIGH (ref 70–99)

## 2021-02-07 LAB — CBC WITH DIFFERENTIAL/PLATELET
Abs Immature Granulocytes: 0.03 10*3/uL (ref 0.00–0.07)
Basophils Absolute: 0 10*3/uL (ref 0.0–0.1)
Basophils Relative: 1 %
Eosinophils Absolute: 0 10*3/uL (ref 0.0–0.5)
Eosinophils Relative: 0 %
HCT: 51.5 % (ref 39.0–52.0)
Hemoglobin: 17.5 g/dL — ABNORMAL HIGH (ref 13.0–17.0)
Immature Granulocytes: 0 %
Lymphocytes Relative: 12 %
Lymphs Abs: 1 10*3/uL (ref 0.7–4.0)
MCH: 29.7 pg (ref 26.0–34.0)
MCHC: 34 g/dL (ref 30.0–36.0)
MCV: 87.3 fL (ref 80.0–100.0)
Monocytes Absolute: 0.7 10*3/uL (ref 0.1–1.0)
Monocytes Relative: 8 %
Neutro Abs: 6.5 10*3/uL (ref 1.7–7.7)
Neutrophils Relative %: 79 %
Platelets: 187 10*3/uL (ref 150–400)
RBC: 5.9 MIL/uL — ABNORMAL HIGH (ref 4.22–5.81)
RDW: 13.4 % (ref 11.5–15.5)
WBC: 8.3 10*3/uL (ref 4.0–10.5)
nRBC: 0 % (ref 0.0–0.2)

## 2021-02-07 LAB — RENAL FUNCTION PANEL
Albumin: 3.5 g/dL (ref 3.5–5.0)
Anion gap: 13 (ref 5–15)
BUN: 20 mg/dL (ref 8–23)
CO2: 21 mmol/L — ABNORMAL LOW (ref 22–32)
Calcium: 8.8 mg/dL — ABNORMAL LOW (ref 8.9–10.3)
Chloride: 105 mmol/L (ref 98–111)
Creatinine, Ser: 1.09 mg/dL (ref 0.61–1.24)
GFR, Estimated: >60 mL/min (ref >60)
Glucose, Bld: 199 mg/dL — ABNORMAL HIGH (ref 70–99)
Phosphorus: 3.3 mg/dL (ref 2.5–4.6)
Potassium: 3.8 mmol/L (ref 3.5–5.1)
Sodium: 139 mmol/L (ref 135–145)

## 2021-02-07 LAB — MAGNESIUM: Magnesium: 2.2 mg/dL (ref 1.7–2.4)

## 2021-02-07 LAB — VITAMIN B1: Vitamin B1 (Thiamine): 194.1 nmol/L (ref 66.5–200.0)

## 2021-02-07 IMAGING — MR MR HEAD WO/W CM
9 of 13 series · 33 of 48 positions shown · IV contrast (gadavist)
Comparison: CT/CTA head and neck [DATE]

CLINICAL DATA: History of fall and intracranial hemorrhage, shunted
for hydrocephalus, found unconscious

EXAM:
MRI HEAD WITHOUT AND WITH CONTRAST
TECHNIQUE: Multiplanar, multiecho pulse sequences of the brain and surrounding
structures were obtained without and with intravenous contrast.
CONTRAST:  9mL GADAVIST GADOBUTROL 1 MMOL/ML IV SOLN

[Series 3: DWI · axial · 3.0mm · 1.09mm/px · z∈[-29,+125]mm · 9 of 107 slices shown (1 of 4)]
[im 1/107]
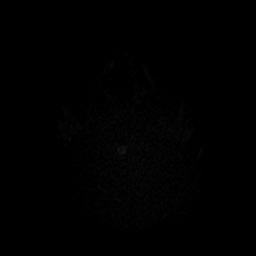
[im 14/107]
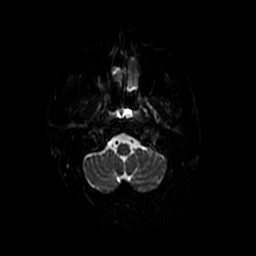
[im 27/107]
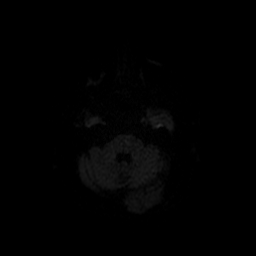
[im 40/107]
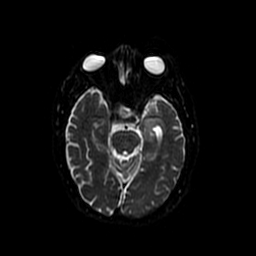
[im 54/107]
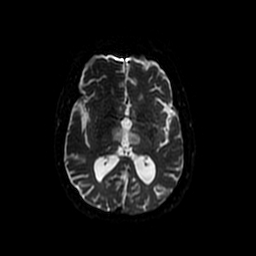
[im 67/107]
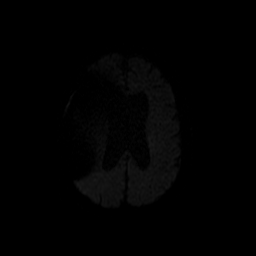
[im 80/107]
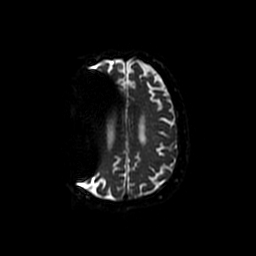
[im 93/107]
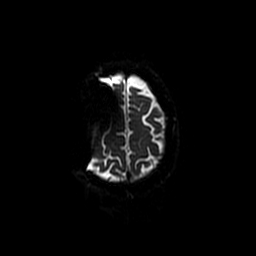
[im 107/107]
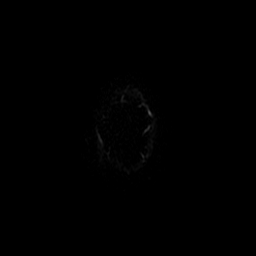

[Series 4: DWI · coronal · 5.0mm · 1.09mm/px · 6 of 70 slices shown (2 of 4)]
[im 1/70]
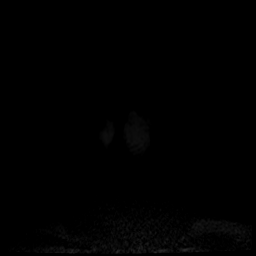
[im 14/70]
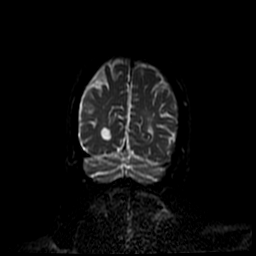
[im 28/70]
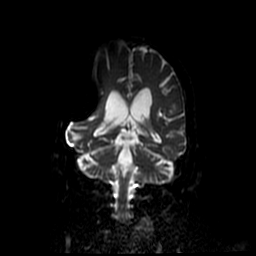
[im 42/70]
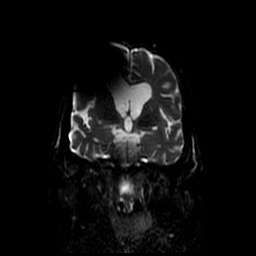
[im 56/70]
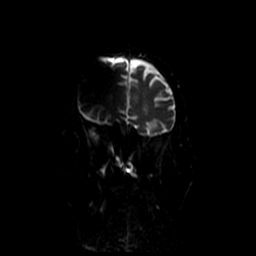
[im 70/70]
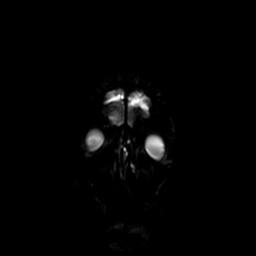

[Series 6: T2 · axial · 5.0mm · 0.45mm/px · 1 of 27 slices shown]
[im 1/27]
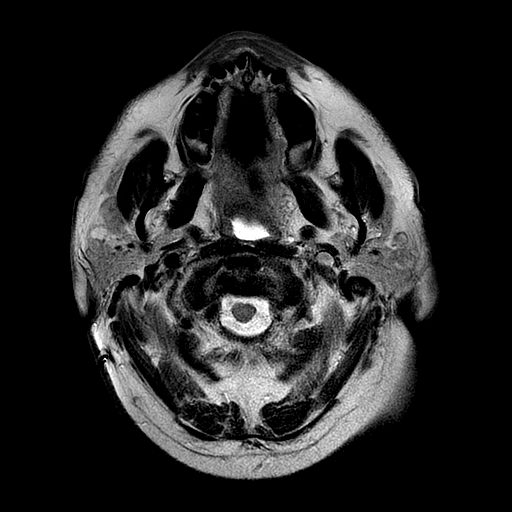

[Series 7: FLAIR · axial · 3.0mm · 0.45mm/px · z∈[-29,+121]mm · 2 of 27 slices shown]
[im 1/27]
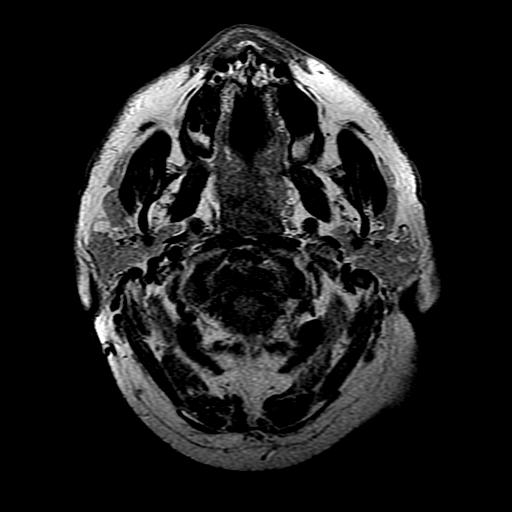
[im 27/27]
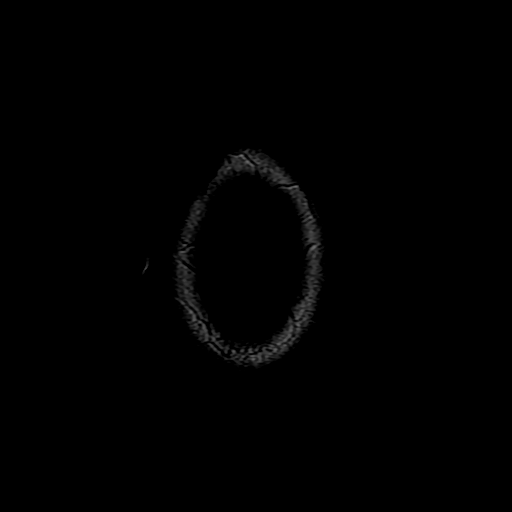

[Series 11: T1 post-contrast · axial · 3.0mm · 0.47mm/px · z∈[-19,+122]mm · 4 of 50 slices shown (1 of 3)]
[im 1/50]
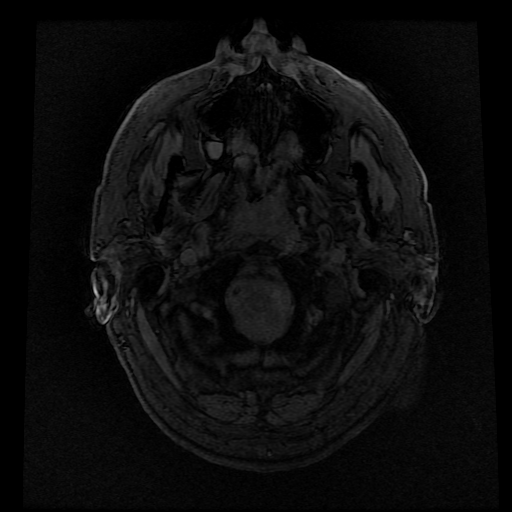
[im 17/50]
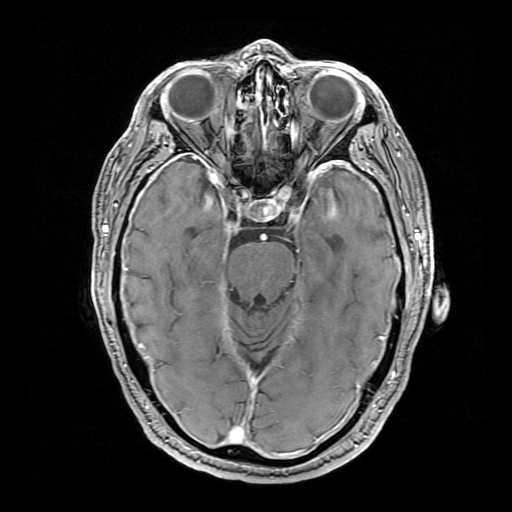
[im 33/50]
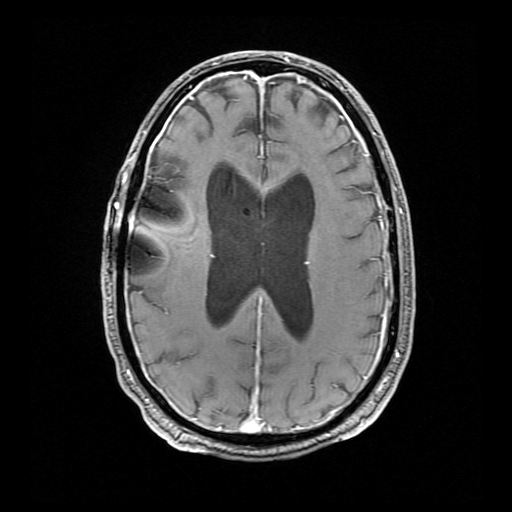
[im 50/50]
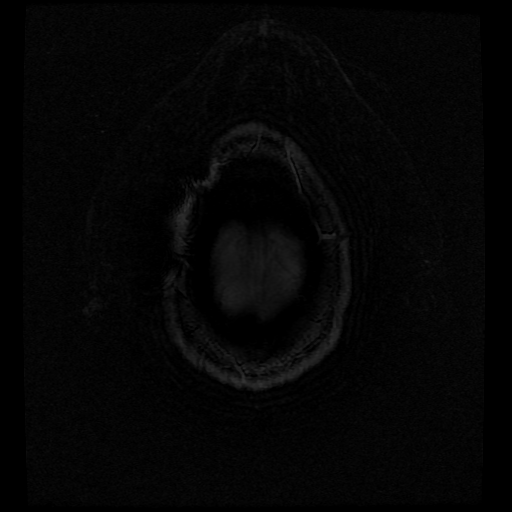

[Series 12: T1 post-contrast · coronal · 5.0mm · 0.39mm/px · 2 of 27 slices shown (2 of 3)]
[im 1/27]
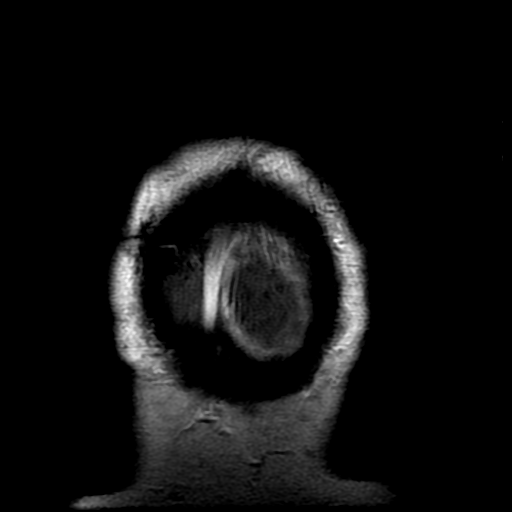
[im 27/27]
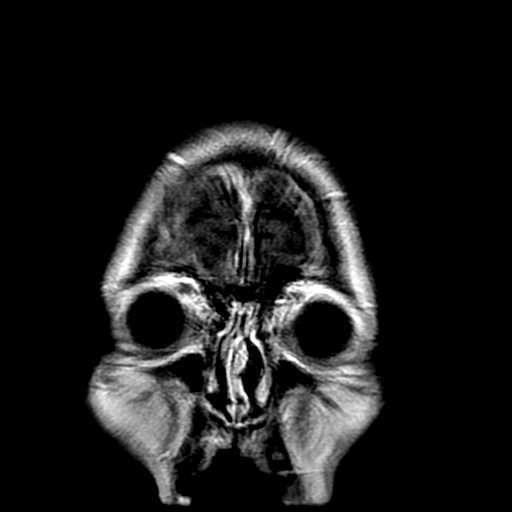

[Series 13: T1 post-contrast · sagittal · 5.0mm · 0.47mm/px · 2 of 25 slices shown (3 of 3)]
[im 1/25]
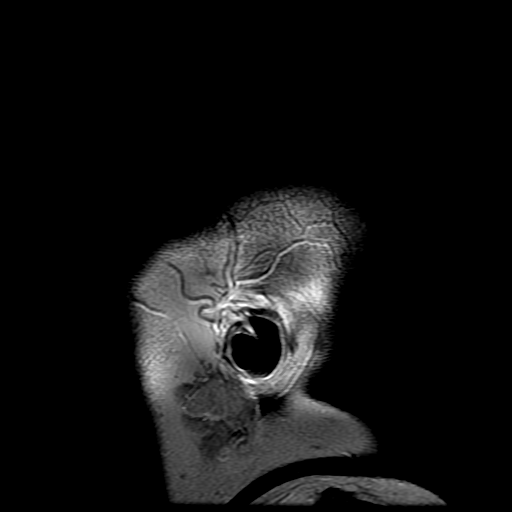
[im 25/25]
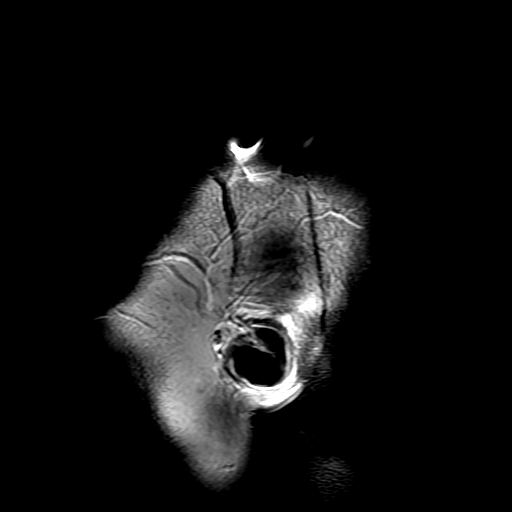

[Series 300: DWI · axial · 3.0mm · 1.09mm/px · z∈[-29,+125]mm · 4 of 54 slices shown (3 of 4)]
[im 1/54]
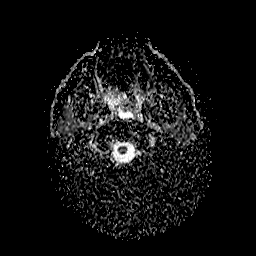
[im 18/54]
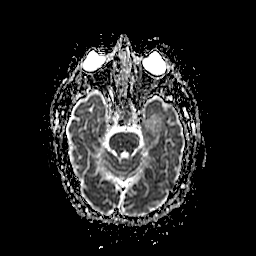
[im 36/54]
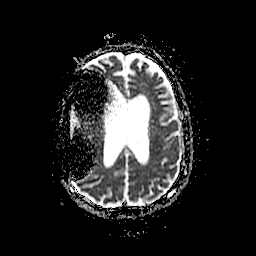
[im 54/54]
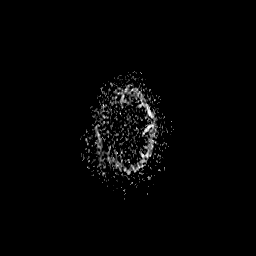

[Series 400: DWI · coronal · 5.0mm · 1.09mm/px · 3 of 35 slices shown (4 of 4)]
[im 1/35]
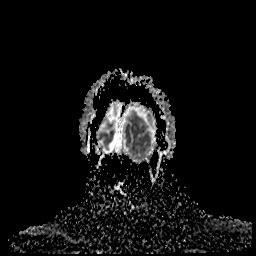
[im 18/35]
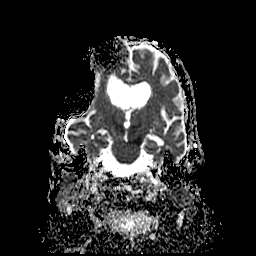
[im 35/35]
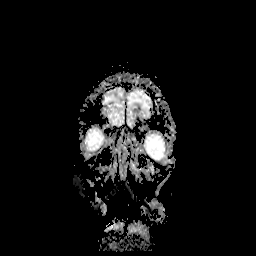

[33 of 48 positions shown; findings below may reference images not displayed]

FINDINGS: Brain: There is a right frontal approach ventricular catheter in
place terminating in the body of the right lateral ventricle.
Ventricular system is stable in size and configuration compared to
the prior head CT, without evidence of transependymal flow of CSF.
There is mild FLAIR signal abnormality around the catheter, similar
to prior studies.

There is confluent diffusion restriction and FLAIR signal
abnormality in the bilateral ventromedial thalami extending along
the third ventricle to the midbrain/cerebral peduncles (left
significantly more than right). There is an additional small focus
of diffusion restriction in the left occipital lobe. There is no
evidence of associated hemorrhage. There is a small remote lacunar
infarct in the left basal ganglia.

There is diffuse pachymeningeal thickening and enhancement. There is
no abnormal parenchymal enhancement or solid mass lesion. There is
no midline shift.

Vascular: The left V4 segment flow void is attenuated proximally, in
keeping with atherosclerotic disease seen on prior CTA head. The
other major flow voids are present.

Skull and upper cervical spine: Normal marrow signal.

Sinuses/Orbits: There is mild mucosal thickening in the paranasal
sinuses. Bilateral lens implants are in place. The globes and orbits
are otherwise unremarkable.

Other: Bilateral mastoid effusions and fluid in the airway is likely
related to instrumentation.
IMPRESSION: 1. Acute infarct in the midbrain/cerebral peduncles, left worse than
right, with superior extent along the third ventricle into the
ventromedial thalami, consistent with an artery of HASSEN
infarct.
2. Additional small area of infarct in the left occipital lobe
3. Stable right frontal ventricular catheter with unchanged size and
configuration of the ventricular system.
4. Diffuse pachymeningeal thickening and enhancement, nonspecific
and likely postprocedural given lack of other findings to suggest
over shunting or infection.

## 2021-02-07 MED ORDER — ASPIRIN 325 MG PO TABS
325.0000 mg | ORAL_TABLET | Freq: Every day | ORAL | Status: DC
  Administered 2021-02-07 – 2021-02-12 (×6): 325 mg
  Filled 2021-02-07 (×5): qty 1

## 2021-02-07 MED ORDER — VITAMIN B-12 1000 MCG PO TABS
2000.0000 ug | ORAL_TABLET | Freq: Every day | ORAL | Status: DC
  Administered 2021-02-07 – 2021-02-12 (×6): 2000 ug
  Filled 2021-02-07 (×5): qty 2

## 2021-02-07 MED ORDER — ASPIRIN 325 MG PO TABS
ORAL_TABLET | ORAL | Status: AC
  Filled 2021-02-07: qty 1

## 2021-02-07 MED ORDER — POTASSIUM CHLORIDE 20 MEQ PO PACK
PACK | ORAL | Status: AC
  Filled 2021-02-07: qty 1

## 2021-02-07 MED ORDER — GADOBUTROL 1 MMOL/ML IV SOLN
9.0000 mL | Freq: Once | INTRAVENOUS | Status: AC | PRN
  Administered 2021-02-07: 9 mL via INTRAVENOUS

## 2021-02-07 MED ORDER — PROSOURCE TF PO LIQD
45.0000 mL | Freq: Three times a day (TID) | ORAL | Status: DC
  Administered 2021-02-07 – 2021-02-12 (×15): 45 mL
  Filled 2021-02-07 (×11): qty 45

## 2021-02-07 MED ORDER — OSMOLITE 1.2 CAL PO LIQD
1000.0000 mL | ORAL | Status: DC
  Administered 2021-02-07 – 2021-02-11 (×4): 1000 mL

## 2021-02-07 MED ORDER — ACETAMINOPHEN 160 MG/5ML PO SOLN
650.0000 mg | Freq: Four times a day (QID) | ORAL | Status: DC | PRN
  Administered 2021-02-08 – 2021-02-12 (×4): 650 mg
  Filled 2021-02-07 (×4): qty 20.3

## 2021-02-07 MED ORDER — PROSOURCE TF PO LIQD
ORAL | Status: AC
  Filled 2021-02-07: qty 45

## 2021-02-07 MED ORDER — INSULIN GLARGINE-YFGN 100 UNIT/ML ~~LOC~~ SOLN
5.0000 [IU] | Freq: Every day | SUBCUTANEOUS | Status: DC
Start: 2021-02-07 — End: 2021-02-08
  Administered 2021-02-07: 5 [IU] via SUBCUTANEOUS
  Filled 2021-02-07 (×2): qty 0.05

## 2021-02-07 MED ORDER — VITAMIN B-12 1000 MCG PO TABS
ORAL_TABLET | ORAL | Status: AC
  Filled 2021-02-07: qty 2

## 2021-02-07 MED ORDER — POTASSIUM CHLORIDE 20 MEQ PO PACK
20.0000 meq | PACK | Freq: Once | ORAL | Status: AC
  Administered 2021-02-07: 20 meq

## 2021-02-07 NOTE — Progress Notes (Signed)
Shunt setting checked post-MRI, still set at correct setting (Certas at 2.0).

## 2021-02-07 NOTE — Progress Notes (Signed)
Initial Nutrition Assessment  DOCUMENTATION CODES:   Not applicable  INTERVENTION:   Initiate tube feeding via OG tube: Osmolite 1.2 at 20 ml/h and increase by 10 ml every 4 hours to goal rate of 60 ml/hr (1440 ml per day) Prosource TF 45 ml TID  Provides 1848 kcal, 113 gm protein, 1167 ml free water daily    NUTRITION DIAGNOSIS:   Inadequate oral intake related to inability to eat as evidenced by NPO status.  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   Pt discussed during ICU rounds and with RN.  Spoke with wife who was at bedside. She reports that pt typically wakes late and eats a full breakfast then eats regular dinner at night with wife. Occasionally has a snack at night. Per her report pt has not been taking his medications recently and has been unsteady on his feet. He has started to use his cane again and has had trouble with word finding and know directions to places. She feel he may be depressed, he has no friends, no hobbies. Only wakes eats, watches TV and sleeps. He is not active and even less now that he is unsteady.  No recent weight loss, but has had some gradual weight loss per wife.   Neuro following for work up. B12 was low normal on replacement, B1 still pending but on replacement MRI today 11/29 shows acute infarct in the midbrain/cerebral peduncles   Patient is currently intubated on ventilator support MV: 10.1 L/min Temp (24hrs), Avg:100.4 F (38 C), Min:99.7 F (37.6 C), Max:101.1 F (38.4 C)   Medications reviewed and include: colace, SSI, semglee, miralax, thiamine, vitamin B12   Labs reviewed: Vitamin B12: 260 OG: 200 ml out x 24 hours    NUTRITION - FOCUSED PHYSICAL EXAM:  Flowsheet Row Most Recent Value  Orbital Region No depletion  Upper Arm Region No depletion  Thoracic and Lumbar Region No depletion  Buccal Region Unable to assess  Temple Region No depletion  Clavicle Bone Region No depletion  Clavicle and Acromion Bone Region No depletion  Scapular Bone Region Unable to assess  Dorsal Hand Unable to assess  Patellar Region Mild depletion  Anterior Thigh Region Mild depletion  Posterior Calf Region Mild depletion  Edema (RD Assessment) None  Hair Reviewed  Eyes Unable to assess  Mouth Unable to assess  Skin Reviewed  Nails Unable to assess       Diet Order:   Diet Order             Diet NPO time specified  Diet effective now                   EDUCATION NEEDS:   No education needs have been identified at this time  Skin:  Skin Assessment:  (medical device related skin injury from EEG leads)  Last BM:  11/29 smear  Height:   Ht Readings from Last 1 Encounters:  Feb 23, 2021 5\' 9"  (1.753 m)    Weight:   Wt Readings from Last 1 Encounters:  02/07/21 90.1 kg    BMI:  Body mass index is 29.33 kg/m.  Estimated Nutritional Needs:   Kcal:  1800-2000  Protein:  110-120 grams  Fluid:  > 1.8L/day  Eldin Bonsell P., RD, LDN, CNSC See AMiON for contact information

## 2021-02-07 NOTE — Progress Notes (Signed)
Reviewed MRI findings with neurology and neuroradiology. Patient had acute ischemic stroke of midbrain likely in the last 24 hours. May have started at home, but exam has clearly worsened since admission. He is outside the window for any intervention. Per neurology watchful waiting game, but the stroke is very concerning. I have updated the patient's wife linda at the bedside regarding these findings.   Stroke team to see patient am per neurology. Advised wife to be present in AM for rounds with additional updates.   Additional cc time 45 minutes.   Durel Salts, MD Pulmonary and Critical Care Medicine [facility] 02/07/2021 5:05 PM Pager: see AMION  If no response to pager, please call critical care on call (see AMION) until 7pm After 7:00 pm call Elink

## 2021-02-07 NOTE — Progress Notes (Signed)
Patient transported to MRI and back without complications. RN at bedside. 

## 2021-02-07 NOTE — Progress Notes (Addendum)
Subjective: Pupils no longer with hippus per RN. Decreased LUE movement.   Objective: Current vital signs: BP 122/76   Pulse 92   Temp (!) 100.9 F (38.3 C)   Resp (!) 22   Ht 5' 9"  (1.753 m)   Wt 90.1 kg   SpO2 94%   BMI 29.33 kg/m  Vital signs in last 24 hours: Temp:  [100.2 F (37.9 C)-101.1 F (38.4 C)] 100.9 F (38.3 C) (11/29 0600) Pulse Rate:  [70-94] 92 (11/29 0600) Resp:  [2-26] 22 (11/29 0600) BP: (106-134)/(70-86) 122/76 (11/29 0600) SpO2:  [90 %-98 %] 94 % (11/29 0600) FiO2 (%):  [40 %] 40 % (11/29 0328) Weight:  [90.1 kg] 90.1 kg (11/29 0500)  Intake/Output from previous day: 11/28 0701 - 11/29 0700 In: 351.5 [IV Piggyback:351.5] Out: 1360 [Urine:1160; Emesis/NG output:200] Intake/Output this shift: No intake/output data recorded. Nutritional status:  Diet Order             Diet NPO time specified  Diet effective now                   HEENT: No neck stiffness. Intubated.  Ext: Warm and well perfused   Neurologic Exam: Ment: Eyes closed with no opening to any stimuli. Does not respond to voice. Will start to localize sternal rub with LUE but does not reach fully for chest. When eyes are held open, does not gaze towards or away from visual stimuli. . Cranial Nerves: II:  No blink to threat. Pupils are round, equal in size at 4 mm and unreactive, worsened since yesterday.   III,IV, VI: Absent oculocephalic reflex. Eyes are midline. No nystagmus.   V: Asymmetrically weak corneal reflexes, weaker on the right VII: Face flaccidly symmetric in lower quadrants. Furrows brow to pain.  VIII: No responses to auditory stimuli IX,X: No gag reflex. Cough reflex intact.  XI: Head is midline XII: Unable to assess Motor/Sensory: Localizes with LUE to sternal rub. Moves LUE to pinch, 2/5 strength, worsened since yesterday.  No movement of RUE to any stimuli; flaccid tone.   Weakly withdraws LLE to plantar stimulation, 2/5 Minimal movement of RLE to plantar  stimulation in conjunction with increased tone.  Deep Tendon Reflexes:  Low amplitude reflexes x 4.  Plantars: Right: Briskly upgoing                               Left: Upgoing Cerebellar/Gait: Unable to assess  Lab Results: Results for orders placed or performed during the hospital encounter of 01/11/2021 (from the past 48 hour(s))  Comprehensive metabolic panel     Status: Abnormal   Collection Time: 02/01/2021  5:25 PM  Result Value Ref Range   Sodium 135 135 - 145 mmol/L   Potassium 4.6 3.5 - 5.1 mmol/L   Chloride 101 98 - 111 mmol/L   CO2 21 (L) 22 - 32 mmol/L   Glucose, Bld 206 (H) 70 - 99 mg/dL    Comment: Glucose reference range applies only to samples taken after fasting for at least 8 hours.   BUN 14 8 - 23 mg/dL   Creatinine, Ser 0.97 0.61 - 1.24 mg/dL   Calcium 8.9 8.9 - 10.3 mg/dL   Total Protein 6.3 (L) 6.5 - 8.1 g/dL   Albumin 3.8 3.5 - 5.0 g/dL   AST 29 15 - 41 U/L   ALT 17 0 - 44 U/L   Alkaline Phosphatase 56 38 -  126 U/L   Total Bilirubin 1.1 0.3 - 1.2 mg/dL   GFR, Estimated >60 >60 mL/min    Comment: (NOTE) Calculated using the CKD-EPI Creatinine Equation (2021)    Anion gap 13 5 - 15    Comment: Performed at [facility] [address] [place], [place] [address]  CBC     Status: Abnormal   Collection Time: 02/08/2021  5:25 PM  Result Value Ref Range   WBC 7.1 4.0 - 10.5 K/uL   RBC 5.89 (H) 4.22 - 5.81 MIL/uL   Hemoglobin 17.0 13.0 - 17.0 g/dL   HCT 52.4 (H) 39.0 - 52.0 %   MCV 89.0 80.0 - 100.0 fL   MCH 28.9 26.0 - 34.0 pg   MCHC 32.4 30.0 - 36.0 g/dL   RDW 13.1 11.5 - 15.5 %   Platelets 170 150 - 400 K/uL   nRBC 0.0 0.0 - 0.2 %    Comment: Performed at [facility], [place] [address], [place], [place] [address]  Ethanol     Status: None   Collection Time: 02/06/2021  5:25 PM  Result Value Ref Range   Alcohol, Ethyl (B) <10 <10 mg/dL    Comment: (NOTE) Lowest detectable limit for serum alcohol is 10 mg/dL.  For medical purposes  only. Performed at [facility], [place] [address], [place], [place] [address]   Lactic acid, plasma     Status: Abnormal   Collection Time: 01/21/2021  5:25 PM  Result Value Ref Range   Lactic Acid, Venous 2.4 (HH) 0.5 - 1.9 mmol/L    Comment: CRITICAL RESULT CALLED TO, READ BACK BY AND VERIFIED WITH: A RAND RN BY SSTEPHENS [phone] [id number] Performed at [facility], [place] [address], [place], [place] [address]   Resp Panel by RT-PCR (Flu A&B, Covid) Nasopharyngeal Swab     Status: None   Collection Time: 01/18/2021  5:33 PM   Specimen: Nasopharyngeal Swab; Nasopharyngeal(NP) swabs in vial transport medium  Result Value Ref Range   SARS Coronavirus 2 by RT PCR NEGATIVE NEGATIVE    Comment: (NOTE) SARS-CoV-2 target nucleic acids are NOT DETECTED.  The SARS-CoV-2 RNA is generally detectable in upper respiratory specimens during the acute phase of infection. The lowest concentration of SARS-CoV-2 viral copies this assay can detect is 138 copies/mL. A negative result does not preclude SARS-Cov-2 infection and should not be used as the sole basis for treatment or other patient management decisions. A negative result may occur with  improper specimen collection/handling, submission of specimen other than nasopharyngeal swab, presence of viral mutation(s) within the areas targeted by this assay, and inadequate number of viral copies(<138 copies/mL). A negative result must be combined with clinical observations, patient history, and epidemiological information. The expected result is Negative.  Fact Sheet for Patients:  EntrepreneurPulse.com.au  Fact Sheet for Healthcare Providers:  IncredibleEmployment.be  This test is no t yet approved or cleared by the [place] FDA and  has been authorized for detection and/or diagnosis of SARS-CoV-2 by FDA under an Emergency Use Authorization (EUA). This EUA will remain  in effect (meaning this test can be  used) for the duration of the COVID-19 declaration under Section 564(b)(1) of the Act, 21 U.S.C.section 360bbb-3(b)(1), unless the authorization is terminated  or revoked sooner.       Influenza A by PCR NEGATIVE NEGATIVE   Influenza B by PCR NEGATIVE NEGATIVE    Comment: (NOTE) The Xpert Xpress SARS-CoV-2/FLU/RSV plus assay is intended  as an aid in the diagnosis of influenza from Nasopharyngeal swab specimens and should not be used as a sole basis for treatment. Nasal washings and aspirates are unacceptable for Xpert Xpress SARS-CoV-2/FLU/RSV testing.  Fact Sheet for Patients: EntrepreneurPulse.com.au  Fact Sheet for Healthcare Providers: IncredibleEmployment.be  This test is not yet approved or cleared by the [place] FDA and has been authorized for detection and/or diagnosis of SARS-CoV-2 by FDA under an Emergency Use Authorization (EUA). This EUA will remain in effect (meaning this test can be used) for the duration of the COVID-19 declaration under Section 564(b)(1) of the Act, 21 U.S.C. section 360bbb-3(b)(1), unless the authorization is terminated or revoked.  Performed at [facility], [place] [address], [place], [place] [address]   I-Stat Chem 8, ED     Status: Abnormal   Collection Time: 01/11/2021  5:36 PM  Result Value Ref Range   Sodium 135 135 - 145 mmol/L   Potassium 6.4 (HH) 3.5 - 5.1 mmol/L   Chloride 102 98 - 111 mmol/L   BUN 20 8 - 23 mg/dL   Creatinine, Ser 0.80 0.61 - 1.24 mg/dL   Glucose, Bld 202 (H) 70 - 99 mg/dL    Comment: Glucose reference range applies only to samples taken after fasting for at least 8 hours.   Calcium, Ion 1.04 (L) 1.15 - 1.40 mmol/L   TCO2 26 22 - 32 mmol/L   Hemoglobin 17.7 (H) 13.0 - 17.0 g/dL   HCT 52.0 39.0 - 52.0 %   Comment NOTIFIED PHYSICIAN   I-Stat arterial blood gas, ED     Status: Abnormal   Collection Time: 01/19/2021  6:37 PM  Result Value Ref Range   pH, Arterial 7.450  7.350 - 7.450   pCO2 arterial 34.6 32.0 - 48.0 mmHg   pO2, Arterial 331 (H) 83.0 - 108.0 mmHg   Bicarbonate 24.2 20.0 - 28.0 mmol/L   TCO2 25 22 - 32 mmol/L   O2 Saturation 100.0 %   Acid-Base Excess 1.0 0.0 - 2.0 mmol/L   Sodium 137 135 - 145 mmol/L   Potassium 4.1 3.5 - 5.1 mmol/L   Calcium, Ion 1.20 1.15 - 1.40 mmol/L   HCT 48.0 39.0 - 52.0 %   Hemoglobin 16.3 13.0 - 17.0 g/dL   Patient temperature 97.5 F    Collection site RADIAL, ALLEN'S TEST ACCEPTABLE    Drawn by Operator    Sample type ARTERIAL   Urine Culture     Status: None   Collection Time: 01/20/2021  7:46 PM   Specimen: Urine, Catheterized  Result Value Ref Range   Specimen Description URINE, CATHETERIZED    Special Requests NONE    Culture      NO GROWTH Performed at [facility], [address] [place], [place] [address]    Report Status 02/06/2021 FINAL   Urinalysis, Routine w reflex microscopic     Status: Abnormal   Collection Time: 01/23/2021  8:16 PM  Result Value Ref Range   Color, Urine STRAW (A) YELLOW   APPearance CLEAR CLEAR   Specific Gravity, Urine 1.015 1.005 - 1.030   pH 5.0 5.0 - 8.0   Glucose, UA >=500 (A) NEGATIVE mg/dL   Hgb urine dipstick SMALL (A) NEGATIVE   Bilirubin Urine NEGATIVE NEGATIVE   Ketones, ur 5 (A) NEGATIVE mg/dL   Protein, ur NEGATIVE NEGATIVE mg/dL   Nitrite NEGATIVE NEGATIVE   Leukocytes,Ua NEGATIVE NEGATIVE   RBC / HPF 0-5 0 - 5 RBC/hpf  WBC, UA 0-5 0 - 5 WBC/hpf   Bacteria, UA NONE SEEN NONE SEEN   Squamous Epithelial / LPF 0-5 0 - 5    Comment: Performed at [facility], [place] [address], [place], [place] [address]  Rapid urine drug screen (hospital performed)     Status: None   Collection Time: 01/25/2021  8:16 PM  Result Value Ref Range   Opiates NONE DETECTED NONE DETECTED   Cocaine NONE DETECTED NONE DETECTED   Benzodiazepines NONE DETECTED NONE DETECTED   Amphetamines NONE DETECTED NONE DETECTED   Tetrahydrocannabinol NONE DETECTED NONE DETECTED    Barbiturates NONE DETECTED NONE DETECTED    Comment: (NOTE) DRUG SCREEN FOR MEDICAL PURPOSES ONLY.  IF CONFIRMATION IS NEEDED FOR ANY PURPOSE, NOTIFY LAB WITHIN 5 DAYS.  LOWEST DETECTABLE LIMITS FOR URINE DRUG SCREEN Drug Class                     Cutoff (ng/mL) Amphetamine and metabolites    1000 Barbiturate and metabolites    200 Benzodiazepine                 373 Tricyclics and metabolites     300 Opiates and metabolites        300 Cocaine and metabolites        300 THC                            50 Performed at [facility], [place] [address], [place], [place] [address]   MRSA Next Gen by PCR, Nasal     Status: None   Collection Time: 01/17/2021  9:00 PM   Specimen: Nasal Mucosa; Nasal Swab  Result Value Ref Range   MRSA by PCR Next Gen NOT DETECTED NOT DETECTED    Comment: (NOTE) The GeneXpert MRSA Assay (FDA approved for NASAL specimens only), is one component of a comprehensive MRSA colonization surveillance program. It is not intended to diagnose MRSA infection nor to guide or monitor treatment for MRSA infections. Test performance is not FDA approved in patients less than 61 years old. Performed at [facility], [place] [address] [place], [place] [address]   Sample to Blood Bank     Status: None   Collection Time: 01/24/2021  9:11 PM  Result Value Ref Range   Blood Bank Specimen SAMPLE AVAILABLE FOR TESTING    Sample Expiration      02/06/2021,2359 Performed at [facility], [place] [address] [place], [place] [address]   Glucose, capillary     Status: Abnormal   Collection Time: 01/20/2021  9:14 PM  Result Value Ref Range   Glucose-Capillary 220 (H) 70 - 99 mg/dL    Comment: Glucose reference range applies only to samples taken after fasting for at least 8 hours.  Protime-INR     Status: None   Collection Time: 01/26/2021  9:28 PM  Result Value Ref Range   Prothrombin Time 12.9 11.4 - 15.2 seconds   INR 1.0 0.8 - 1.2    Comment: (NOTE) INR goal varies  based on device and disease states. Performed at [facility], [place] [address], [place], [place] [address]   Hemoglobin A1c     Status: Abnormal   Collection Time: 01/21/2021  9:28 PM  Result Value Ref Range   Hgb A1c MFr Bld 9.0 (H) 4.8 - 5.6 %    Comment: (NOTE)  Prediabetes: 5.7 - 6.4         Diabetes: >6.4         Glycemic control for adults with diabetes: <7.0    Mean Plasma Glucose 212 mg/dL    Comment: (NOTE) Performed At: [facility] [place], [place] [id number] Rush Farmer MD [id number]   HIV Antibody (routine testing w rflx)     Status: None   Collection Time: 01/28/2021  9:28 PM  Result Value Ref Range   HIV Screen 4th Generation wRfx Non Reactive Non Reactive    Comment: Performed at [facility], [place] [address], [place], [place] [address]  CBC     Status: Abnormal   Collection Time: 02/03/2021  9:28 PM  Result Value Ref Range   WBC 6.9 4.0 - 10.5 K/uL   RBC 5.90 (H) 4.22 - 5.81 MIL/uL   Hemoglobin 17.6 (H) 13.0 - 17.0 g/dL   HCT 50.1 39.0 - 52.0 %   MCV 84.9 80.0 - 100.0 fL   MCH 29.8 26.0 - 34.0 pg   MCHC 35.1 30.0 - 36.0 g/dL   RDW 13.1 11.5 - 15.5 %   Platelets 170 150 - 400 K/uL   nRBC 0.0 0.0 - 0.2 %    Comment: Performed at [facility], [place] [address] [place], [place] [address]  Culture, blood (routine x 2)     Status: None (Preliminary result)   Collection Time: 02/04/2021  9:28 PM   Specimen: BLOOD LEFT HAND  Result Value Ref Range   Specimen Description BLOOD LEFT HAND    Special Requests AEROBIC BOTTLE ONLY Blood Culture adequate volume    Culture      NO GROWTH 2 DAYS Performed at [facility], [place] [address], [place], [place] [address]    Report Status PENDING   Basic metabolic panel     Status: Abnormal   Collection Time: 01/29/2021  9:28 PM  Result Value Ref Range   Sodium 134 (L) 135 - 145 mmol/L   Potassium 3.8 3.5 - 5.1 mmol/L   Chloride 100 98 - 111 mmol/L   CO2 23 22 - 32 mmol/L    Glucose, Bld 203 (H) 70 - 99 mg/dL    Comment: Glucose reference range applies only to samples taken after fasting for at least 8 hours.   BUN 13 8 - 23 mg/dL   Creatinine, Ser 0.96 0.61 - 1.24 mg/dL   Calcium 8.8 (L) 8.9 - 10.3 mg/dL   GFR, Estimated >60 >60 mL/min    Comment: (NOTE) Calculated using the CKD-EPI Creatinine Equation (2021)    Anion gap 11 5 - 15    Comment: Performed at [facility] [address], [place], [place] [address]  Magnesium     Status: None   Collection Time: 01/10/2021  9:28 PM  Result Value Ref Range   Magnesium 2.2 1.7 - 2.4 mg/dL    Comment: Performed at [facility], [place] [address], [place], [place] [address]  Culture, blood (routine x 2)     Status: None (Preliminary result)   Collection Time: 01/23/2021  9:31 PM   Specimen: BLOOD RIGHT HAND  Result Value Ref Range   Specimen Description BLOOD RIGHT HAND    Special Requests      BOTTLES DRAWN AEROBIC AND ANAEROBIC Blood Culture adequate volume   Culture      NO GROWTH 2 DAYS Performed at [facility], [place]  [address] [place], [place] [address]    Report Status PENDING   Troponin I (High Sensitivity)     Status: Abnormal   Collection Time: 01/11/2021 11:17 PM  Result Value Ref Range   Troponin I (High Sensitivity) 46 (H) <18 ng/L    Comment: (NOTE) Elevated high sensitivity troponin I (hsTnI) values and significant  changes across serial measurements may suggest ACS but many other  chronic and acute conditions are known to elevate hsTnI results.  Refer to the "Links" section for chest pain algorithms and additional  guidance. Performed at [facility], [place] [address], [place], [place] [address]   Ammonia     Status: None   Collection Time: 01/13/2021 11:17 PM  Result Value Ref Range   Ammonia 16 9 - 35 umol/L    Comment: Performed at [facility], [place] [address], [place], [place] [address]  Ethanol     Status: None   Collection Time: 01/13/2021 11:17 PM  Result  Value Ref Range   Alcohol, Ethyl (B) <10 <10 mg/dL    Comment: (NOTE) Lowest detectable limit for serum alcohol is 10 mg/dL.  For medical purposes only. Performed at [facility], [place] [address], [place], [place] [address]   TSH     Status: None   Collection Time: 02/04/2021 11:17 PM  Result Value Ref Range   TSH 1.664 0.350 - 4.500 uIU/mL    Comment: Performed by a 3rd Generation assay with a functional sensitivity of <=0.01 uIU/mL. Performed at [facility], [place] [address] [place], [place] [address]   Vitamin B12     Status: None   Collection Time: 01/30/2021 11:17 PM  Result Value Ref Range   Vitamin B-12 260 180 - 914 pg/mL    Comment: (NOTE) This assay is not validated for testing neonatal or myeloproliferative syndrome specimens for Vitamin B12 levels. Performed at [facility], [place] [address], [place], [place] [address]   Folate     Status: None   Collection Time: 02/02/2021 11:17 PM  Result Value Ref Range   Folate 12.9 >5.9 ng/mL    Comment: Performed at [facility] [address], [place], [place] [address]  Glucose, capillary     Status: Abnormal   Collection Time: 01/31/2021 11:17 PM  Result Value Ref Range   Glucose-Capillary 192 (H) 70 - 99 mg/dL    Comment: Glucose reference range applies only to samples taken after fasting for at least 8 hours.  Cooxemetry Panel (carboxy, met, total hgb, O2 sat)     Status: Abnormal   Collection Time: 01/31/2021 11:35 PM  Result Value Ref Range   Total hemoglobin 18.2 (H) 12.0 - 16.0 g/dL   O2 Saturation 58.8 %   Carboxyhemoglobin 1.1 0.5 - 1.5 %   Methemoglobin 0.9 0.0 - 1.5 %    Comment: Performed at [facility] [address], [place], [place] [address]  Glucose, capillary     Status: Abnormal   Collection Time: 02/06/21  3:43 AM  Result Value Ref Range   Glucose-Capillary 180 (H) 70 - 99 mg/dL    Comment: Glucose reference range applies only to samples taken after fasting for at least 8  hours.  CBC     Status: Abnormal   Collection Time: 02/06/21  4:13 AM  Result Value Ref Range   WBC 8.4 4.0 - 10.5 K/uL   RBC 5.92 (H) 4.22 - 5.81 MIL/uL   Hemoglobin 17.3 (  H) 13.0 - 17.0 g/dL   HCT 51.9 39.0 - 52.0 %   MCV 87.7 80.0 - 100.0 fL   MCH 29.2 26.0 - 34.0 pg   MCHC 33.3 30.0 - 36.0 g/dL   RDW 13.3 11.5 - 15.5 %   Platelets 184 150 - 400 K/uL   nRBC 0.0 0.0 - 0.2 %    Comment: Performed at [facility], [place] [address] [place], [place] [address]  Basic metabolic panel     Status: Abnormal   Collection Time: 02/06/21  4:13 AM  Result Value Ref Range   Sodium 137 135 - 145 mmol/L   Potassium 3.8 3.5 - 5.1 mmol/L   Chloride 102 98 - 111 mmol/L   CO2 24 22 - 32 mmol/L   Glucose, Bld 168 (H) 70 - 99 mg/dL    Comment: Glucose reference range applies only to samples taken after fasting for at least 8 hours.   BUN 14 8 - 23 mg/dL   Creatinine, Ser 0.90 0.61 - 1.24 mg/dL   Calcium 8.9 8.9 - 10.3 mg/dL   GFR, Estimated >60 >60 mL/min    Comment: (NOTE) Calculated using the CKD-EPI Creatinine Equation (2021)    Anion gap 11 5 - 15    Comment: Performed at [facility] [address], [place], [place] [address]  Magnesium     Status: None   Collection Time: 02/06/21  4:13 AM  Result Value Ref Range   Magnesium 2.1 1.7 - 2.4 mg/dL    Comment: Performed at [facility] [address], [place], [place] [address]  Phosphorus     Status: None   Collection Time: 02/06/21  4:13 AM  Result Value Ref Range   Phosphorus 2.5 2.5 - 4.6 mg/dL    Comment: Performed at [facility] [address], [place], [place] [address]  Lactic acid, plasma     Status: Abnormal   Collection Time: 02/06/21  4:13 AM  Result Value Ref Range   Lactic Acid, Venous 2.4 (HH) 0.5 - 1.9 mmol/L    Comment: CRITICAL VALUE NOTED.  VALUE IS CONSISTENT WITH PREVIOUSLY REPORTED AND CALLED VALUE. Performed at [facility], [place] [address], [place], [place] [address]   Troponin I  (High Sensitivity)     Status: Abnormal   Collection Time: 02/06/21  4:13 AM  Result Value Ref Range   Troponin I (High Sensitivity) 29 (H) <18 ng/L    Comment: (NOTE) Elevated high sensitivity troponin I (hsTnI) values and significant  changes across serial measurements may suggest ACS but many other  chronic and acute conditions are known to elevate hsTnI results.  Refer to the "Links" section for chest pain algorithms and additional  guidance. Performed at [facility], [place] [address], [place], [place] [address]   Triglycerides     Status: Abnormal   Collection Time: 02/06/21  4:13 AM  Result Value Ref Range   Triglycerides 200 (H) <150 mg/dL    Comment: Performed at [facility] [address] [place], [place] [address]  Glucose, capillary     Status: Abnormal   Collection Time: 02/06/21 11:01 AM  Result Value Ref Range   Glucose-Capillary 208 (H) 70 - 99 mg/dL    Comment: Glucose reference range applies only to samples taken after fasting for at least 8 hours.  Glucose, capillary     Status: Abnormal   Collection Time: 02/06/21  3:22 PM  Result Value Ref Range   Glucose-Capillary 175 (H) 70 - 99 mg/dL    Comment: Glucose reference range applies only to samples taken after fasting for at least 8 hours.  Glucose, capillary     Status: Abnormal   Collection Time: 02/06/21  7:24 PM  Result Value Ref Range   Glucose-Capillary 194 (H) 70 - 99 mg/dL    Comment: Glucose reference range applies only to samples taken after fasting for at least 8 hours.  Glucose, capillary     Status: Abnormal   Collection Time: 02/06/21 11:31 PM  Result Value Ref Range   Glucose-Capillary 206 (H) 70 - 99 mg/dL    Comment: Glucose reference range applies only to samples taken after fasting for at least 8 hours.  Glucose, capillary     Status: Abnormal   Collection Time: 02/07/21  3:33 AM  Result Value Ref Range   Glucose-Capillary 199 (H) 70 - 99 mg/dL    Comment: Glucose reference  range applies only to samples taken after fasting for at least 8 hours.  CBC with Differential/Platelet     Status: Abnormal   Collection Time: 02/07/21  3:40 AM  Result Value Ref Range   WBC 8.3 4.0 - 10.5 K/uL   RBC 5.90 (H) 4.22 - 5.81 MIL/uL   Hemoglobin 17.5 (H) 13.0 - 17.0 g/dL   HCT 51.5 39.0 - 52.0 %   MCV 87.3 80.0 - 100.0 fL   MCH 29.7 26.0 - 34.0 pg   MCHC 34.0 30.0 - 36.0 g/dL   RDW 13.4 11.5 - 15.5 %   Platelets 187 150 - 400 K/uL   nRBC 0.0 0.0 - 0.2 %   Neutrophils Relative % 79 %   Neutro Abs 6.5 1.7 - 7.7 K/uL   Lymphocytes Relative 12 %   Lymphs Abs 1.0 0.7 - 4.0 K/uL   Monocytes Relative 8 %   Monocytes Absolute 0.7 0.1 - 1.0 K/uL   Eosinophils Relative 0 %   Eosinophils Absolute 0.0 0.0 - 0.5 K/uL   Basophils Relative 1 %   Basophils Absolute 0.0 0.0 - 0.1 K/uL   Immature Granulocytes 0 %   Abs Immature Granulocytes 0.03 0.00 - 0.07 K/uL    Comment: Performed at [facility], [address], [place], [place] [address]  Renal function panel     Status: Abnormal   Collection Time: 02/07/21  3:40 AM  Result Value Ref Range   Sodium 139 135 - 145 mmol/L   Potassium 3.8 3.5 - 5.1 mmol/L   Chloride 105 98 - 111 mmol/L   CO2 21 (L) 22 - 32 mmol/L   Glucose, Bld 199 (H) 70 - 99 mg/dL    Comment: Glucose reference range applies only to samples taken after fasting for at least 8 hours.   BUN 20 8 - 23 mg/dL   Creatinine, Ser 1.09 0.61 - 1.24 mg/dL   Calcium 8.8 (L) 8.9 - 10.3 mg/dL   Phosphorus 3.3 2.5 - 4.6 mg/dL   Albumin 3.5 3.5 - 5.0 g/dL   GFR, Estimated >60 >60 mL/min    Comment: (NOTE) Calculated using the CKD-EPI Creatinine Equation (2021)    Anion gap 13 5 - 15    Comment: Performed at [facility] [address] [place], [place] [address]  Magnesium     Status: None   Collection Time: 02/07/21  3:40 AM  Result Value Ref Range   Magnesium 2.2 1.7 - 2.4 mg/dL  Comment: Performed at [facility], [place] [address],  [place], [place] [address]    Recent Results (from the past 240 hour(s))  Resp Panel by RT-PCR (Flu A&B, Covid) Nasopharyngeal Swab     Status: None   Collection Time: 01/27/2021  5:33 PM   Specimen: Nasopharyngeal Swab; Nasopharyngeal(NP) swabs in vial transport medium  Result Value Ref Range Status   SARS Coronavirus 2 by RT PCR NEGATIVE NEGATIVE Final    Comment: (NOTE) SARS-CoV-2 target nucleic acids are NOT DETECTED.  The SARS-CoV-2 RNA is generally detectable in upper respiratory specimens during the acute phase of infection. The lowest concentration of SARS-CoV-2 viral copies this assay can detect is 138 copies/mL. A negative result does not preclude SARS-Cov-2 infection and should not be used as the sole basis for treatment or other patient management decisions. A negative result may occur with  improper specimen collection/handling, submission of specimen other than nasopharyngeal swab, presence of viral mutation(s) within the areas targeted by this assay, and inadequate number of viral copies(<138 copies/mL). A negative result must be combined with clinical observations, patient history, and epidemiological information. The expected result is Negative.  Fact Sheet for Patients:  EntrepreneurPulse.com.au  Fact Sheet for Healthcare Providers:  IncredibleEmployment.be  This test is no t yet approved or cleared by the [place] FDA and  has been authorized for detection and/or diagnosis of SARS-CoV-2 by FDA under an Emergency Use Authorization (EUA). This EUA will remain  in effect (meaning this test can be used) for the duration of the COVID-19 declaration under Section 564(b)(1) of the Act, 21 U.S.C.section 360bbb-3(b)(1), unless the authorization is terminated  or revoked sooner.       Influenza A by PCR NEGATIVE NEGATIVE Final   Influenza B by PCR NEGATIVE NEGATIVE Final    Comment: (NOTE) The Xpert Xpress SARS-CoV-2/FLU/RSV plus  assay is intended as an aid in the diagnosis of influenza from Nasopharyngeal swab specimens and should not be used as a sole basis for treatment. Nasal washings and aspirates are unacceptable for Xpert Xpress SARS-CoV-2/FLU/RSV testing.  Fact Sheet for Patients: EntrepreneurPulse.com.au  Fact Sheet for Healthcare Providers: IncredibleEmployment.be  This test is not yet approved or cleared by the [place] FDA and has been authorized for detection and/or diagnosis of SARS-CoV-2 by FDA under an Emergency Use Authorization (EUA). This EUA will remain in effect (meaning this test can be used) for the duration of the COVID-19 declaration under Section 564(b)(1) of the Act, 21 U.S.C. section 360bbb-3(b)(1), unless the authorization is terminated or revoked.  Performed at [facility], [place] [address], [place], [place] [address]   Urine Culture     Status: None   Collection Time: 02/07/2021  7:46 PM   Specimen: Urine, Catheterized  Result Value Ref Range Status   Specimen Description URINE, CATHETERIZED  Final   Special Requests NONE  Final   Culture   Final    NO GROWTH Performed at [place] [address], [place], [place] [address]    Report Status 02/06/2021 FINAL  Final  MRSA Next Gen by PCR, Nasal     Status: None   Collection Time: 02/04/2021  9:00 PM   Specimen: Nasal Mucosa; Nasal Swab  Result Value Ref Range Status   MRSA by PCR Next Gen NOT DETECTED NOT DETECTED Final    Comment: (NOTE) The GeneXpert MRSA Assay (FDA approved for NASAL specimens only), is one component of a comprehensive MRSA colonization surveillance program. It is  not intended to diagnose MRSA infection nor to guide or monitor treatment for MRSA infections. Test performance is not FDA approved in patients less than 39 years old. Performed at [facility], [place] [address] [place], [place] [address]   Culture, blood (routine x 2)     Status:  None (Preliminary result)   Collection Time: 01/28/2021  9:28 PM   Specimen: BLOOD LEFT HAND  Result Value Ref Range Status   Specimen Description BLOOD LEFT HAND  Final   Special Requests AEROBIC BOTTLE ONLY Blood Culture adequate volume  Final   Culture   Final    NO GROWTH 2 DAYS Performed at [facility], [place] [address] [place], [place] [address]    Report Status PENDING  Incomplete  Culture, blood (routine x 2)     Status: None (Preliminary result)   Collection Time: 01/24/2021  9:31 PM   Specimen: BLOOD RIGHT HAND  Result Value Ref Range Status   Specimen Description BLOOD RIGHT HAND  Final   Special Requests   Final    BOTTLES DRAWN AEROBIC AND ANAEROBIC Blood Culture adequate volume   Culture   Final    NO GROWTH 2 DAYS Performed at [facility], [place] [address] [place], [place] [address]    Report Status PENDING  Incomplete    Lipid Panel Recent Labs    02/06/21 0413  TRIG 200*    Studies/Results: CT ANGIO HEAD NECK W WO CM  Result Date: 01/14/2021 CLINICAL DATA:  "Basilar thrombosis" EXAM: CT ANGIOGRAPHY HEAD AND NECK TECHNIQUE: Multidetector CT imaging of the head and neck was performed using the standard protocol during bolus administration of intravenous contrast. Multiplanar CT image reconstructions and MIPs were obtained to evaluate the vascular anatomy. Carotid stenosis measurements (when applicable) are obtained utilizing NASCET criteria, using the distal internal carotid diameter as the denominator. CONTRAST:  132m OMNIPAQUE IOHEXOL 350 MG/ML SOLN COMPARISON:  CTA 08/11/2019, correlation is also made with same day CT head. FINDINGS: CT HEAD FINDINGS For noncontrast findings, please see same day CT head. CTA NECK FINDINGS Aortic arch: Standard branching. Imaged portion shows no evidence of aneurysm or dissection. No significant stenosis of the major arch vessel origins. Aortic atherosclerosis. Right carotid system: No evidence of dissection, stenosis  (50% or greater) or occlusion. Left carotid system: No evidence of dissection, stenosis (50% or greater) or occlusion. Vertebral arteries: Focal narrowing just distal to the takeoff of the right vertebral artery (series 7, image 575), unchanged. The right vertebral artery is otherwise patent. 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 Skeleton: No acute osseous abnormality. Other neck: Endotracheal tube and orogastric tube noted. Otherwise negative. Upper chest: No focal pulmonary opacity or pleural effusion. Debris in the trachea and right mainstem bronchus. Review of the MIP images confirms the above findings CTA HEAD FINDINGS Anterior circulation: Both internal carotid arteries are patent to the termini, without stenosis or other abnormality. A1 segments patent. The anterior communicating artery is not visualized. Anterior cerebral arteries are noted at patent to their distal aspects. No M1 stenosis or occlusion. Normal MCA bifurcations. Focal stenosis in a right M2 branch (series 7, images 230), which is new from the prior exam. MCA branches otherwise perfused and symmetric. Posterior circulation: Reconstitution of flow in the distal left V3 and V4 segments, overall unchanged. The right vertebral artery  is patent to the vertebrobasilar junction. Posterior inferior cerebral arteries patent bilaterally. Basilar is irregular but patent to its distal aspect. Superior cerebellar arteries patent bilaterally. Near fetal origin of the left PCA, with focal narrowing of the distal left posterior communicating artery proximal to the P1-P2 junction (series 7, image 241), which is new from the prior exam. Redemonstrated focal stenosis in the left P2 segment (series 7, image 235). The left PCA is otherwise perfused although it is irregular. Normal right PCA. The right posterior  communicating artery is not visualized. Venous sinuses: As permitted by contrast timing, patent. Anatomic variants: None significant Review of the MIP images confirms the above findings IMPRESSION: 1. New focal stenosis in a right M2 branch, with otherwise patent anterior circulation. 2. Focal narrowing of the distal left posterior communicating artery, which is new from prior exam, with redemonstrated focal stenosis in the left P2 segment. The remainder of the left PCA is perfused but irregular. 3. 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 4. Debris in the trachea and right mainstem bronchus. 5. Redemonstrated basilar irregularity, with no evidence of basilar thrombosis. Electronically Signed   By: Merilyn Baba M.D.   On: 02/07/2021 21:38   DG Abdomen 1 View  Result Date: 02/01/2021 CLINICAL DATA:  Post intubation. EXAM: PORTABLE CHEST 1 VIEW, portable two views abdomen. COMPARISON:  Chest x-ray 08/11/2019. FINDINGS: Endotracheal tube tip is 2.5 cm above the carina. Enteric tube tip is at the level of the proximal duodenum. VP shunt overlies the right chest and abdomen. Distal tip is in the left lower quadrant. Visualized portion of the catheter appears intact. The lungs are clear. The cardiomediastinal silhouette is within normal limits. There is no pleural effusion or pneumothorax. Small calcified nodule seen in the right upper lobe. Bowel-gas pattern is normal. There are no suspicious calcifications. No acute fractures are seen. IMPRESSION: 1. Lines and tubes as above. 2. Lungs are clear. 3. Normal bowel-gas pattern. Electronically Signed   By: Ronney Asters M.D.   On: 01/27/2021 18:44   CT HEAD WO CONTRAST  Result Date: 01/27/2021 CLINICAL DATA:  Trauma. EXAM: CT HEAD WITHOUT CONTRAST CT CERVICAL SPINE WITHOUT CONTRAST TECHNIQUE: Multidetector CT imaging of the head and  cervical spine was performed following the standard protocol without intravenous contrast. Multiplanar CT image reconstructions of the cervical spine were also generated. COMPARISON:  CT head 01/16/2021.  MRI brain 08/12/2019. FINDINGS: CT HEAD FINDINGS Brain: No evidence of acute infarction, hemorrhage, extra-axial collection or mass lesion/mass effect. Mild diffuse atrophy is unchanged. Right frontal ventriculostomy catheter is stable in position ending in the frontal horn of the right lateral ventricle. Ventricular size is mildly dilated and unchanged. There is stable mild periventricular white matter hypodensity, likely chronic small vessel ischemic change. 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 Vascular: No hyperdense vessel or unexpected calcification. Skull: Normal. Negative for fracture or focal lesion. Sinuses/Orbits: No acute finding. Other: None. CT CERVICAL SPINE FINDINGS Alignment: Normal. Skull base and vertebrae: No acute fracture. No primary bone lesion or focal pathologic process. Soft tissues and spinal canal: No prevertebral fluid or swelling. No visible canal hematoma. Disc levels: There is mild disc space narrowing and endplate osteophyte formation compatible with degenerative change at C4-C5. No significant central canal or neural foraminal stenosis at any level. Upper chest: Negative. Other: There is fluid in the nasopharynx.  Enteric tube is present. Patient is intubated. IMPRESSION: 1.  No acute intracranial process. 2. No acute fracture or traumatic subluxation of the cervical spine. 3.  VP shunt is unchanged in position.  Ventricular size is stable. Electronically Signed   By: Ronney Asters M.D.   On: 01/12/2021 18:04   CT CERVICAL SPINE WO CONTRAST  Result Date: 01/12/2021 CLINICAL DATA:  Trauma. EXAM: CT HEAD WITHOUT CONTRAST CT CERVICAL SPINE WITHOUT CONTRAST TECHNIQUE: Multidetector CT imaging of the head and cervical spine was performed following the  standard protocol without intravenous contrast. Multiplanar CT image reconstructions of the cervical spine were also generated. COMPARISON:  CT head 01/16/2021.  MRI brain 08/12/2019. FINDINGS: CT HEAD FINDINGS Brain: No evidence of acute infarction, hemorrhage, extra-axial collection or mass lesion/mass effect. Mild diffuse atrophy is unchanged. Right frontal ventriculostomy catheter is stable in position ending in the frontal horn of the right lateral ventricle. Ventricular size is mildly dilated and unchanged. There is stable mild periventricular white matter hypodensity, likely chronic small vessel ischemic change. 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 Vascular: No hyperdense vessel or unexpected calcification. Skull: Normal. Negative for fracture or focal lesion. Sinuses/Orbits: No acute finding. Other: None. CT CERVICAL SPINE FINDINGS Alignment: Normal. Skull base and vertebrae: No acute fracture. No primary bone lesion or focal pathologic process. Soft tissues and spinal canal: No prevertebral fluid or swelling. No visible canal hematoma. Disc levels: There is mild disc space narrowing and endplate osteophyte formation compatible with degenerative change at C4-C5. No significant central canal or neural foraminal stenosis at any level. Upper chest: Negative. Other: There is fluid in the nasopharynx. Enteric tube is present. Patient is intubated. IMPRESSION: 1.  No acute intracranial process. 2. No acute fracture or traumatic subluxation of the cervical spine. 3.  VP shunt is unchanged in position.  Ventricular size is stable. Electronically Signed   By: Ronney Asters M.D.   On: 01/31/2021 18:04   DG Pelvis Portable  Result Date: 01/23/2021 CLINICAL DATA:  Shunt, fall. EXAM: PORTABLE PELVIS 1-2 VIEWS COMPARISON:  Pelvis x-ray 07/14/2018. FINDINGS: There is no evidence of pelvic fracture or diastasis. No pelvic bone lesions are seen. IMPRESSION: Negative. Electronically Signed    By: Ronney Asters M.D.   On: 01/19/2021 18:44   DG Chest Portable 1 View  Result Date: 01/11/2021 CLINICAL DATA:  Post intubation. EXAM: PORTABLE CHEST 1 VIEW, portable two views abdomen. COMPARISON:  Chest x-ray 08/11/2019. FINDINGS: Endotracheal tube tip is 2.5 cm above the carina. Enteric tube tip is at the level of the proximal duodenum. VP shunt overlies the right chest and abdomen. Distal tip is in the left lower quadrant. Visualized portion of the catheter appears intact. The lungs are clear. The cardiomediastinal silhouette is within normal limits. There is no pleural effusion or pneumothorax. Small calcified nodule seen in the right upper lobe. Bowel-gas pattern is normal. There are no suspicious calcifications. No acute fractures are seen. IMPRESSION: 1. Lines and tubes as above. 2. Lungs are clear. 3. Normal bowel-gas pattern. Electronically Signed   By: Ronney Asters M.D.   On: 01/16/2021 18:44   EEG adult  Result Date: 02/06/2021 Lora Havens, MD     02/06/2021 10:10 AM Patient Name: [name] Frank. MRN: [id number] Epilepsy Attending: Lora Havens Referring Physician/Provider: Dr Donnetta Simpers Date: 01/11/2021 Duration: 21.13 mins Patient history: 70 year old male with past medical history of traumatic subdural hematomas and subarachnoid hemorrhage who presented with altered mental  status.  EEG 20 for seizure. Level of alertness: lethargic AEDs during EEG study: LEV Technical aspects: This EEG study was done with scalp electrodes positioned according to the 10-20 International system of electrode placement. Electrical activity was acquired at a sampling rate of 500Hz  and reviewed with a high frequency filter of 70Hz  and a low frequency filter of 1Hz . EEG data were recorded continuously and digitally stored. Description: EEG showed continuous generalized 3 to 6 Hz theta-delta slowing admixed with 15 to 18 Hz frontocentral beta activity. Hyperventilation and photic stimulation were  not performed.   ABNORMALITY - Continuous slow, generalized IMPRESSION: This study is suggestive of moderate to severe diffuse encephalopathy, nonspecific etiology.  No seizures or epileptiform discharges were seen throughout the recording. Lora Havens   ECHOCARDIOGRAM COMPLETE  Result Date: 02/06/2021    ECHOCARDIOGRAM REPORT   Patient Name:   Geo Slone. Date of Exam: 02/06/2021 Medical Rec #:  [id number]         Height:       69.0 in Accession #:    [id number]        Weight:       199.3 lb Date of Birth:  10-18-1950         BSA:          2.063 m Patient Age:    70 years          BP:           127/76 mmHg Patient Gender: M                 HR:           76 bpm. Exam Location:  Inpatient Procedure: 2D Echo, Cardiac Doppler and Color Doppler Indications:    Abnormal EKG  History:        Patient has prior history of Echocardiogram examinations, most                 recent 07/18/2018. CAD, Arrythmias:Atrial Fibrillation; Risk                 Factors:Hypertension, Diabetes and Dyslipidemia.  Sonographer:    Wenda Low Referring Phys: [id number] Estill Cotta  Sonographer Comments: Technically difficult study due to poor echo windows and echo performed with patient supine and on artificial respirator. IMPRESSIONS  1. POor acoustic windows. APical window is foreshortened. Overall LVEF is normal There is distal anteroseptal, apical hypokinesis/akinesis. DIfficult to compare to previous echo given acoustic windows/foreshortening. Overall, LVEF appearss mildly decreased from previous COnsider limited echo when pt can turn, is not on ventilator. . Left ventricular ejection fraction, by estimation, is 50 to 55%. The left ventricle has low normal function. The left ventricle has no regional wall motion abnormalities. There is mild left ventricular hypertrophy.  2. Right ventricular systolic function is low normal. The right ventricular size is mildly enlarged.  3. The mitral valve is normal in structure.  Trivial mitral valve regurgitation.  4. The aortic valve is normal in structure. Aortic valve regurgitation is not visualized. FINDINGS  Left Ventricle: POor acoustic windows. APical window is foreshortened. Overall LVEF is normal There is distal anteroseptal, apical hypokinesis/akinesis. DIfficult to compare to previous echo given acoustic windows/foreshortening. Overall, LVEF appearss mildly decreased from previous COnsider limited echo when pt can turn, is not on ventilator. Left ventricular ejection fraction, by estimation, is 50 to 55%. The left ventricle has low normal function. The left ventricle has no regional wall motion abnormalities. The left  ventricular internal cavity size was normal in size. There is mild left ventricular hypertrophy. Right Ventricle: The right ventricular size is mildly enlarged. Right vetricular wall thickness was not assessed. Right ventricular systolic function is low normal. Left Atrium: Left atrial size was normal in size. Right Atrium: Right atrial size was normal in size. Pericardium: There is no evidence of pericardial effusion. Mitral Valve: The mitral valve is normal in structure. There is mild thickening of the mitral valve leaflet(s). Trivial mitral valve regurgitation. MV peak gradient, 2.2 mmHg. The mean mitral valve gradient is 1.0 mmHg. Tricuspid Valve: The tricuspid valve is normal in structure. Tricuspid valve regurgitation is trivial. Aortic Valve: The aortic valve is normal in structure. Aortic valve regurgitation is not visualized. Aortic valve mean gradient measures 2.0 mmHg. Aortic valve peak gradient measures 3.7 mmHg. Aortic valve area, by VTI measures 2.36 cm. Pulmonic Valve: The pulmonic valve was not assessed. Aorta: The aortic root is normal in size and structure. IAS/Shunts: No atrial level shunt detected by color flow Doppler.  LEFT VENTRICLE PLAX 2D LVIDd:         3.30 cm LVIDs:         2.30 cm LV PW:         1.20 cm LV IVS:        1.40 cm LVOT diam:      2.00 cm LV SV:         44 LV SV Index:   21 LVOT Area:     3.14 cm  LV Volumes (MOD) LV vol d, MOD A2C: 30.1 ml LV vol d, MOD A4C: 44.6 ml LV vol s, MOD A2C: 16.4 ml LV vol s, MOD A4C: 19.2 ml LV SV MOD A2C:     13.7 ml LV SV MOD A4C:     44.6 ml LV SV MOD BP:      21.5 ml RIGHT VENTRICLE RV Basal diam:  3.30 cm RV Mid diam:    3.10 cm LEFT ATRIUM             Index       RIGHT ATRIUM           Index LA diam:        2.60 cm 1.26 cm/m  RA Area:     10.60 cm LA Vol (A2C):   11.5 ml 5.57 ml/m  RA Volume:   22.00 ml  10.66 ml/m LA Vol (A4C):   20.2 ml 9.79 ml/m LA Biplane Vol: 15.5 ml 7.51 ml/m  AORTIC VALVE AV Area (Vmax):    2.74 cm AV Area (Vmean):   3.07 cm AV Area (VTI):     2.36 cm AV Vmax:           96.70 cm/s AV Vmean:          64.300 cm/s AV VTI:            0.188 m AV Peak Grad:      3.7 mmHg AV Mean Grad:      2.0 mmHg LVOT Vmax:         84.20 cm/s LVOT Vmean:        62.800 cm/s LVOT VTI:          0.141 m LVOT/AV VTI ratio: 0.75  AORTA Ao Root diam: 3.40 cm MITRAL VALVE MV Area (PHT): 2.56 cm    SHUNTS MV Area VTI:   2.06 cm    Systemic VTI:  0.14 m MV Peak grad:  2.2 mmHg  Systemic Diam: 2.00 cm MV Mean grad:  1.0 mmHg MV Vmax:       0.75 m/s MV Vmean:      40.6 cm/s MV Decel Time: 296 msec MV E velocity: 48.80 cm/s MV A velocity: 67.70 cm/s MV E/A ratio:  0.72 Dorris Carnes MD Electronically signed by Dorris Carnes MD Signature Date/Time: 02/06/2021/4:15:59 PM    Final     Medications: Scheduled:  chlorhexidine gluconate (MEDLINE KIT)  15 mL Mouth Rinse BID   Chlorhexidine Gluconate Cloth  6 each Topical Daily   docusate  100 mg Per Tube BID   heparin  5,000 Units Subcutaneous Q8H   insulin aspart  0-15 Units Subcutaneous Q4H   mouth rinse  15 mL Mouth Rinse 10 times per day   pantoprazole sodium  40 mg Per Tube QHS   polyethylene glycol  17 g Per Tube Daily   [START ON 02/14/2021] thiamine injection  100 mg Intravenous Daily   Continuous:  levETIRAcetam Stopped (02/06/21 2322)    propofol (DIPRIVAN) infusion Stopped (02/06/21 0302)   thiamine injection 100 mL/hr at 02/07/21 0600   Followed by   Derrill Memo ON 02/08/2021] thiamine injection      Assessment: 70 y.o. 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   - Examinations: -His initial neuro exam was notable for patchy brainstem reflexes with nonreactive pupils, no corneal reflexes, no gag but cough was present with ability to localize with bilateral upper extremities and able to withdraw bilateral lower extremities.   - Exam on Monday: Right upper and lower extremity move less than the left to stimulation, suggestive of a left cerebral hemisphere lesion. Upgoing toe on the right and increased tone of RLE relative to LLE suggests chronicity. Pupils exhibiting hippus bilaterally and symmetrically, round and 4-5 mm in size, but no reactivity to light. Also with weak asymmetric corneal reflexes, no oculocephalic reflex and no gag. Cough reflex is present and furrows brow to pain. Continued patchy brainstem reflexes despite discontinuation of propofol overnight.  - Exam today: Shows a diffuse decrease in the asymmetric limb movements noted yesterday. Pupils no longer reactive. Continues to have weak corneal reflexes, no oculocephalic reflex. Has a cough but no gag reflex.   - CT head and CTA were negative for ICH or any large vessel occlusion.   - He has not had any fever and did not have any headache or other obvious signs of meningitis over the last few days.   - He is not on any significant sedating medications at home and his drug screen is negative for opioids or any other  substances.  No alcohol in the blood. - TSH normal. Methemoglobin normal. HIV nonreactive. EtOH negative. UTOX negative. Folate normal. Blood cultures pending.  - Vitamin B12 level of 260 is low by neurological standards. Would not result in acute neurological decompensation but could decrease baseline neurological reserve.  - Official EEG report 11/28: Continuous generalized slowing. This study is suggestive of moderate to severe diffuse encephalopathy, nonspecific etiology.  No seizures or epileptiform discharges were seen throughout the recording. LTM discontinued on Monday.  - DDx for acute encephalopathy with unresponsiveness of unclear cause includes unwitnessed seizure at home followed by postictal state. This  is felt to be the most likely etiology given his history of partial status epilepticus. Doubt shunt failure given intact VP shunt on CT head. Low suspicion for meningitis at this time.   Recommendations: - Continue Keppra 500 mg BID, gradually switch back to Lamictal over a course of 6 weeks when he is able to tolerate PO intake. - MRI Brain with and without contrast should be obtained today if possible - Vit B1 level pending - High-dose IV thiamine  - B12 supplementation.    35 minutes spent in the neurological evaluation and management of this critically ill patient  Addendum: - MRI brain completed: 1. 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 2. Additional small area of infarct in the left occipital lobe 3. Stable right frontal ventricular catheter with unchanged size and configuration of the ventricular system. 4. Diffuse pachymeningeal thickening and enhancement, nonspecific and likely postprocedural given lack of other findings to suggest over shunting or infection. - CTA and echocardiogram already performed. 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  - Starting ASA 325 mg po qd - Regarding the pachymeningeal thickening, this is relatively mild and most likely is incidental. However, repeat imaging in one week to assess for resolution may be indicated. - Cardiac telemetry - BP management. Out of permissive HTN time window - May have a poor long-term prognosis given extent of midbrain involvement by the stroke. However, too early to tell to what extent he will recover.  - Stroke team to follow in AM.     LOS: 2 days   @Electronically  signed: Dr. Kerney Elbe 02/07/2021  7:38 AM

## 2021-02-07 NOTE — Progress Notes (Signed)
NAME:  [name] Frank, Fronczak MRN:  [id number], DOB:  1950/12/07, LOS: 2 ADMISSION DATE:  01/24/2021, CONSULTATION DATE:  11/27 REFERRING MD:  Audley Hose, CHIEF COMPLAINT:  Fall and AMS   History of Present Illness:  70 year old man who presented to the [facility] ED 11/27 as a level 1 trauma after a fall. PMHx significant for HTN, HLD, Afib (not on [facility]) CAD s/p DES to LAD (03/2014), T2DM, SDH/SAH, status epilepticus, hydrocephalus (s/p VP shunt 9/20).  Patient's LKN was ~1100 11/27, seen by his wife. He was found by his wife ~1700 between the toilet and bathtub in the bathroom and appeared unresponsive. EMS was called.  On ED arrival, initial GCS of 7. He was intubated for airway protection. Afebrile with labs notable for WBC 7.1, lactate 2.4. CT Head and C-spine were negative for intracranial process and demonstrated C-spine fracture or abnormality. VP shunt was noted to be in unchanged position with stable appearance of ventricles. Neurology was consulted. LTM EEG was initiated. PCCM was consulted for admission.  Pertinent Medical History:  SDH/SAH, status epilepticus disorder, hydrocephalus s/p VP shunt (9/20), HTN, HLD, afib, DM2. CAD post DES (01/16) x1 to LAD  Significant Hospital Events: Including procedures, antibiotic start and stop dates in addition to other pertinent events   11/27 presented to [facility] as level 1 trauma. CT Head NAICA. CTA Head/Neck with new focal stenosis M2 branch, focal narrowing of distal L PCA, unchanged L vertebral artery occlusion (V1/V2) with irregular flow in distal V3/prox V4 (retrograde flow), redemonstrated basilar irregularity, no basilar thrombosis. 11/28 LTM EEG showing moderate to severe diffuse encephalopathy (nonspecific). No seizures. Echo with mildly decreased LVEF from prior, mild LVH, distal anteroseptal and apical hypokinesis (though evaluation limited by exam). MRI pending. EEG discontinued. 11/29 Remains low-grade febrile to Tmax 100.9. Decreased spontaneous LUE  movement and decreased pupil size/reactivity, otherwise unchanged neuro exam. MRI Brain pending. NSG consult for CSF valve evaluation post-scan.  Interim History / Subjective:  No significant overnight events Continues to have low grade fever to 100.9 Tmax Pupils decreased in size and reactivity today Less spontaneous LUE movement Neuro exam otherwise unchanged today MRI Brain pending NSG for CSF valve eval post-scan  Objective:  Blood pressure 115/71, pulse 84, temperature (!) 100.4 F (38 C), resp. rate 11, height 5\' 9"  (1.753 m), weight 90.1 kg, SpO2 98 %.    Vent Mode: PRVC FiO2 (%):  [40 %] 40 % Set Rate:  [18 bmp] 18 bmp Vt Set:  [560 mL] 560 mL PEEP:  [5 cmH20] 5 cmH20 Plateau Pressure:  [12 cmH20-17 cmH20] 17 cmH20   Intake/Output Summary (Last 24 hours) at 02/07/2021 0955 Last data filed at 02/07/2021 0900 Gross per 24 hour  Intake 349.97 ml  Output 1360 ml  Net -1010.03 ml    Filed Weights   02/01/2021 1725 02/06/21 0500 02/07/21 0500  Weight: 94 kg 90.4 kg 90.1 kg   Physical Examination: General: Acutely ill-appearing middle-aged man in NAD. HEENT: [place]/AT, anicteric sclera, pupils equal 73mm, L pupil ?slightly more vertically elongated, minimally reactive, moist mucous membranes. ETT in place. Neuro:  Unresponsive.  Withdraws to pain in all 4 extremities, L > R. Moves LUE slightly with noxious stimulation. Not following commands. Relative Unilateral R-sided neglect noted. +Corneal and +Cough  CV: RRR, no m/g/r. PULM: Breathing even and unlabored on vent (PEEP 5, FiO2 40%). Lung fields CTAB. GI: Soft, nontender, mildly distended. Hypoactive bowel sounds. Extremities: No LE edema noted. Skin: Warm/dry, no rashes.  Resolved Hospital Problem  List:     Assessment & Plan:  Acute encephalopathy HX Status epilepticus HX SAH/SDH HX Hydrocephalus s/p VP shunt EtOH neg, COVID, FLU A&B neg,?seizure vs stroke vs. ?vagal on toilet leading to syncope. Troponin  downtrending. Ammonia, B12, folate, TSH WNL. On Keppra. No clear infectious source. EKG with diffuse T-wave inversions. Lactate minimally elevated. CTA Head/Neck as above. - Neuro following, appreciate assistance - AEDs per Neuro, s/p Keppra/Vimpat - F/u MRI with and without contrast - NSG consulted for CSF valve eval/reset post-scan - Neuroprotective measures: HOB > 30 degrees, normoglycemia, normothermia, electrolytes WNL - F/u pending culture data - Consider CSF eval from shunt vs. LP in the setting of persistent LG fever and neuro status change - F/u pending BCx/UCx, consider CSF eval from shunt vs. LP in the setting of new fever if worsening neurologic status pending MRI results  Endotracheally intubated GCS 7, intubated for airway protection by EDP. - Continue full vent support (4-8cc/kg IBW) - Wean FiO2 for O2 sat > 90% - WUA/SBT, mental status precludes any attempt at extubation at present - VAP bundle - Pulmonary hygiene - PAD protocol for sedation: Propofol and Fentanyl PRN for goal RASS 0 to -1; off sedation at present to assess neurologic exam  Fall ?Syncopal event vs stroke. Evaluated by Trauma on arrival and deemed most likely medical cause of AMS/unresponsiveness. CT Head/C-sine negative for acute process. - F/u pending workup as above - Fall precautions  Lactic acidosis Lactate 2.4, possibly from metformin. - Trend LA to normal - Fluid resuscitation as appropriate  T2DM Suspect stress BG-206 - CBGs Q4H - Goal 140-180 - SSI PRN  HX Afib HX CAD post DES (01/16) x1 to LAD HX HTN HX HLD Does not appear to be on AC. NSR on exam. Trop mildly elevated, now downtrending. Echo 11/28 grossly unchanged, LVEF slightly decreased with mild LVH, distal anteroseptal and apical hypokinesis (though evaluation limited by exam) - Hold home antihypertensives, resume as clinically appropriate - ASA/Statin - Lovenox for ppx  Best Practice (right click and "Reselect all SmartList  Selections" daily)   Diet/type: NPO w/ meds via tube DVT prophylaxis: SCD, Lovenox GI prophylaxis: PPI Lines: N/A Foley:  N/A Code Status:  full code Last date of multidisciplinary goals of care discussion [11/28, discussed with wife via phone; seen at bedside 11/29]  Critical care time: 36 minutes   Lestine Mount, PA-C [facility] 02/07/21 9:55 AM  Please see [email or web address] for pager details.  From 7A-7P if no response, please call [phone] After hours, please call ELink [phone]

## 2021-02-08 ENCOUNTER — Inpatient Hospital Stay (HOSPITAL_COMMUNITY): Payer: Medicare HMO

## 2021-02-08 DIAGNOSIS — J9690 Respiratory failure, unspecified, unspecified whether with hypoxia or hypercapnia: Secondary | ICD-10-CM

## 2021-02-08 DIAGNOSIS — R509 Fever, unspecified: Secondary | ICD-10-CM

## 2021-02-08 DIAGNOSIS — I6310 Cerebral infarction due to embolism of unspecified precerebral artery: Secondary | ICD-10-CM

## 2021-02-08 DIAGNOSIS — I6340 Cerebral infarction due to embolism of unspecified cerebral artery: Secondary | ICD-10-CM | POA: Insufficient documentation

## 2021-02-08 LAB — CBC WITH DIFFERENTIAL/PLATELET
Abs Immature Granulocytes: 0.03 10*3/uL (ref 0.00–0.07)
Basophils Absolute: 0 10*3/uL (ref 0.0–0.1)
Basophils Relative: 1 %
Eosinophils Absolute: 0 10*3/uL (ref 0.0–0.5)
Eosinophils Relative: 0 %
HCT: 49.1 % (ref 39.0–52.0)
Hemoglobin: 16.2 g/dL (ref 13.0–17.0)
Immature Granulocytes: 0 %
Lymphocytes Relative: 7 %
Lymphs Abs: 0.5 10*3/uL — ABNORMAL LOW (ref 0.7–4.0)
MCH: 29.5 pg (ref 26.0–34.0)
MCHC: 33 g/dL (ref 30.0–36.0)
MCV: 89.3 fL (ref 80.0–100.0)
Monocytes Absolute: 0.5 10*3/uL (ref 0.1–1.0)
Monocytes Relative: 7 %
Neutro Abs: 6.5 10*3/uL (ref 1.7–7.7)
Neutrophils Relative %: 85 %
Platelets: 173 10*3/uL (ref 150–400)
RBC: 5.5 MIL/uL (ref 4.22–5.81)
RDW: 13.3 % (ref 11.5–15.5)
WBC: 7.6 10*3/uL (ref 4.0–10.5)
nRBC: 0 % (ref 0.0–0.2)

## 2021-02-08 LAB — URINALYSIS, ROUTINE W REFLEX MICROSCOPIC
Glucose, UA: >=500 mg/dL — AB
Ketones, ur: 40 mg/dL — AB
Leukocytes,Ua: NEGATIVE
Nitrite: NEGATIVE
Protein, ur: NEGATIVE mg/dL
Specific Gravity, Urine: 1.02 (ref 1.005–1.030)
pH: 5.5 (ref 5.0–8.0)

## 2021-02-08 LAB — BASIC METABOLIC PANEL
Anion gap: 14 (ref 5–15)
BUN: 35 mg/dL — ABNORMAL HIGH (ref 8–23)
CO2: 18 mmol/L — ABNORMAL LOW (ref 22–32)
Calcium: 8.8 mg/dL — ABNORMAL LOW (ref 8.9–10.3)
Chloride: 106 mmol/L (ref 98–111)
Creatinine, Ser: 1.3 mg/dL — ABNORMAL HIGH (ref 0.61–1.24)
GFR, Estimated: 60 mL/min — ABNORMAL LOW (ref >60)
Glucose, Bld: 256 mg/dL — ABNORMAL HIGH (ref 70–99)
Potassium: 4.3 mmol/L (ref 3.5–5.1)
Sodium: 138 mmol/L (ref 135–145)

## 2021-02-08 LAB — MAGNESIUM
Magnesium: 2.5 mg/dL — ABNORMAL HIGH (ref 1.7–2.4)
Magnesium: 2.8 mg/dL — ABNORMAL HIGH (ref 1.7–2.4)

## 2021-02-08 LAB — URINALYSIS, MICROSCOPIC (REFLEX)

## 2021-02-08 LAB — PHOSPHORUS
Phosphorus: 3.2 mg/dL (ref 2.5–4.6)
Phosphorus: 2.8 mg/dL (ref 2.5–4.6)

## 2021-02-08 LAB — GLUCOSE, CAPILLARY
Glucose-Capillary: 256 mg/dL — ABNORMAL HIGH (ref 70–99)
Glucose-Capillary: 268 mg/dL — ABNORMAL HIGH (ref 70–99)
Glucose-Capillary: 346 mg/dL — ABNORMAL HIGH (ref 70–99)
Glucose-Capillary: 387 mg/dL — ABNORMAL HIGH (ref 70–99)
Glucose-Capillary: 300 mg/dL — ABNORMAL HIGH (ref 70–99)
Glucose-Capillary: 268 mg/dL — ABNORMAL HIGH (ref 70–99)
Glucose-Capillary: 222 mg/dL — ABNORMAL HIGH (ref 70–99)
Glucose-Capillary: 238 mg/dL — ABNORMAL HIGH (ref 70–99)

## 2021-02-08 LAB — METHYLMALONIC ACID, SERUM: Methylmalonic Acid, Quantitative: 179 nmol/L (ref 0–378)

## 2021-02-08 IMAGING — DX DG CHEST 1V PORT
1 series · 1 of 1 positions shown · non-contrast
Comparison: [DATE]

CLINICAL DATA: Fever

EXAM:
PORTABLE CHEST 1 VIEW

[chest ap]
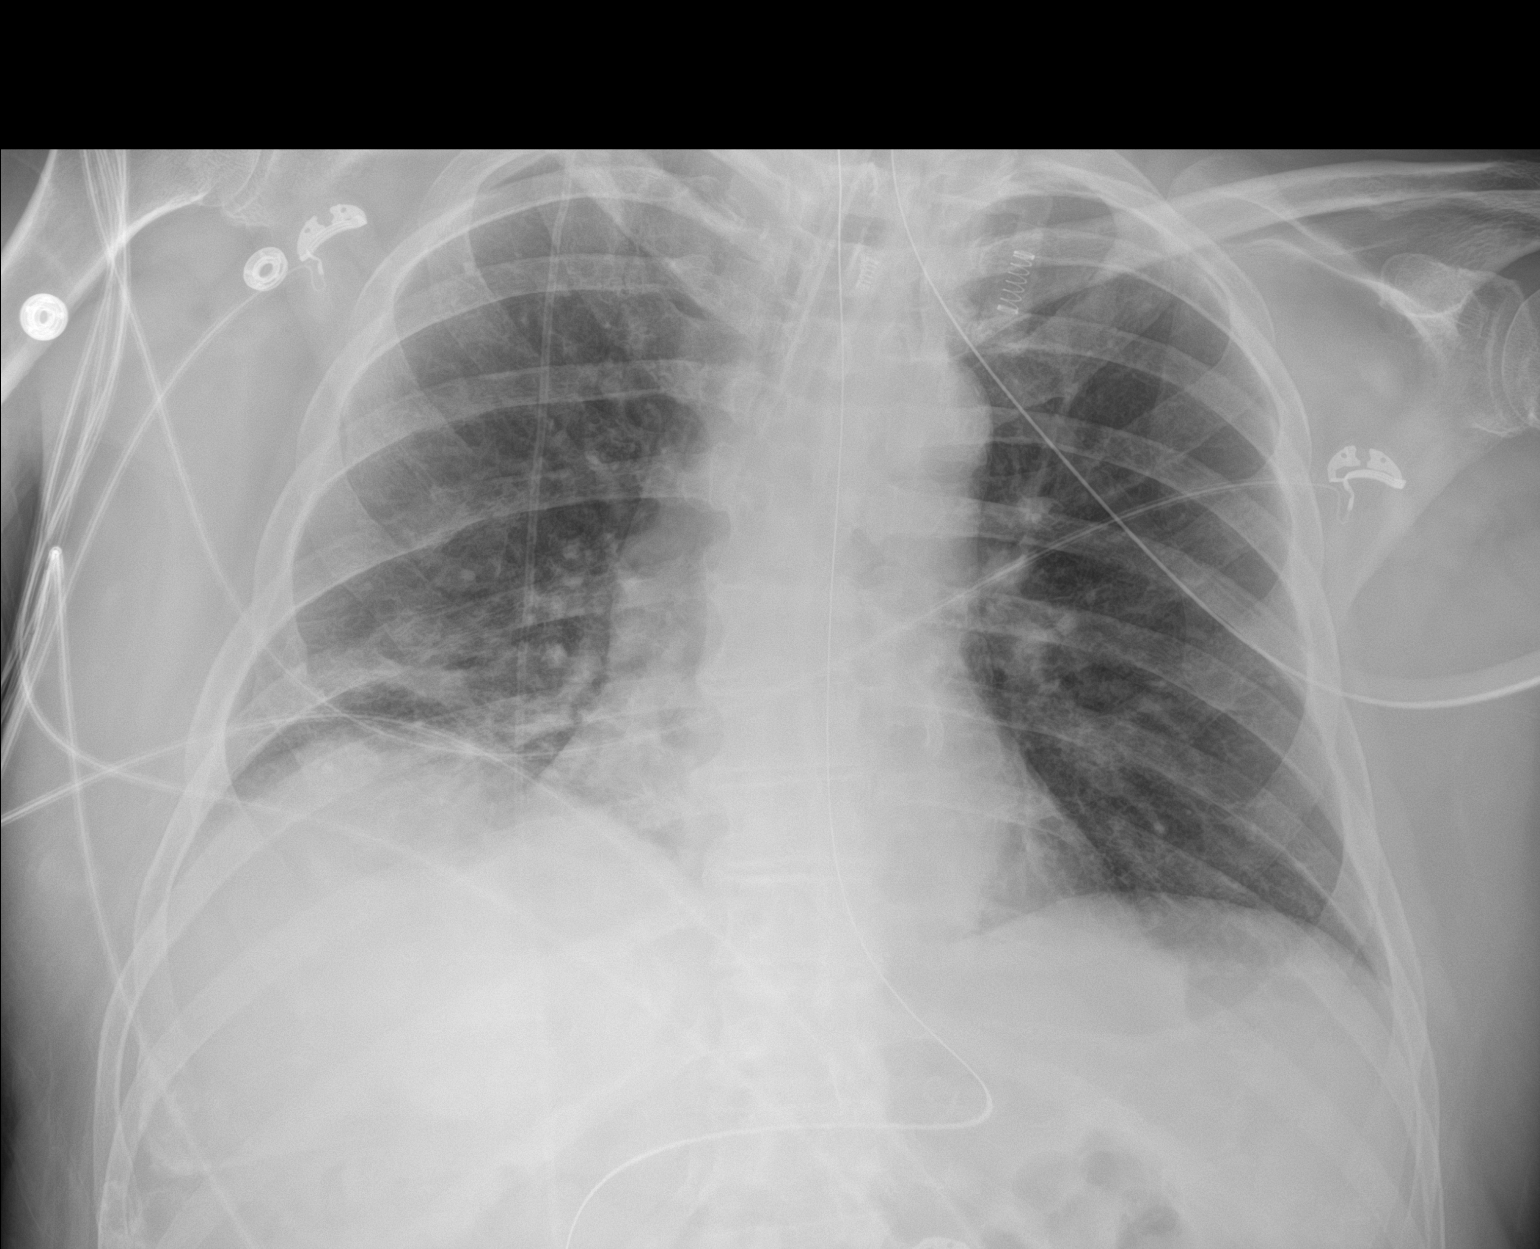

[1 of 1 positions shown; findings below may reference images not displayed]

FINDINGS: Endotracheal tube and partially imaged enteric tube and shunt
catheter. Low lung volumes. Patchy density at the right lung base.
No pleural effusion. Stable cardiomediastinal contours.
IMPRESSION: Patchy atelectasis/consolidation at the right lung base.

## 2021-02-08 MED ORDER — INSULIN GLARGINE-YFGN 100 UNIT/ML ~~LOC~~ SOLN
12.0000 [IU] | Freq: Every day | SUBCUTANEOUS | Status: DC
  Administered 2021-02-08: 12 [IU] via SUBCUTANEOUS
  Filled 2021-02-08 (×2): qty 0.12

## 2021-02-08 MED ORDER — LEVETIRACETAM IN NACL 500 MG/100ML IV SOLN
500.0000 mg | Freq: Two times a day (BID) | INTRAVENOUS | Status: AC
  Administered 2021-02-08 (×2): 500 mg via INTRAVENOUS
  Filled 2021-02-08 (×2): qty 100

## 2021-02-08 MED ORDER — LAMOTRIGINE 100 MG PO TABS
100.0000 mg | ORAL_TABLET | Freq: Two times a day (BID) | ORAL | Status: DC
  Administered 2021-02-08 – 2021-02-12 (×9): 100 mg
  Filled 2021-02-08 (×9): qty 1

## 2021-02-08 MED ORDER — ROSUVASTATIN CALCIUM 5 MG PO TABS
5.0000 mg | ORAL_TABLET | Freq: Every day | ORAL | Status: DC
Start: 2021-02-08 — End: 2021-02-12
  Administered 2021-02-08 – 2021-02-12 (×5): 5 mg
  Filled 2021-02-08 (×5): qty 1

## 2021-02-08 MED ORDER — INSULIN ASPART 100 UNIT/ML IJ SOLN
3.0000 [IU] | INTRAMUSCULAR | Status: DC
  Administered 2021-02-08 – 2021-02-09 (×7): 3 [IU] via SUBCUTANEOUS

## 2021-02-08 NOTE — Progress Notes (Signed)
Inpatient Diabetes Program Recommendations  AACE/ADA: New Consensus Statement on Inpatient Glycemic Control (2015)  Target Ranges:  Prepandial:   less than 140 mg/dL      Peak postprandial:   less than 180 mg/dL (1-2 hours)      Critically ill patients:  140 - 180 mg/dL   Lab Results  Component Value Date   GLUCAP 268 (H) 02/08/2021   HGBA1C 9.0 (H) 01/24/2021    Review of Glycemic Control  Latest Reference Range & Units 02/07/21 23:21 02/08/21 03:28 02/08/21 07:36  Glucose-Capillary 70 - 99 mg/dL 818 (H) 403 (H) 754 (H)  (H): Data is abnormally high Diabetes history: Type 2 DM Outpatient Diabetes medications: Invokana 100 mg QD Current orders for Inpatient glycemic control: Semglee 12 units QD, Novolog 0-15 units Q4H Osmolite @ 60 ml/hr  Inpatient Diabetes Program Recommendations:    Consider adding Novolog 3 units Q4H for tube feed coverage (to be stopped or held in the event tube feeds are stopped).   Thanks, Lujean Rave, MSN, RNC-OB Diabetes Coordinator [phone] (8a-5p)

## 2021-02-08 NOTE — Progress Notes (Signed)
Reviewed Dr. Humphrey Rolls note. Based on the MRI findings, which revealed an early subacute stroke in the artery of [place]her[place] distribution, in conjunction with symptom onset at 11 AM Sunday followed by worsening, the stroke occurred at home, prior to arrival to the hospital. He was out of the TNK time window on arrival with no LVO on CTA per Dr. Tollie Eth initial evaluation. Evolution of pupillary findings yesterday from hippus with sluggish reactivity on Monday to no hippus without reactivity to light on Tuesday is anatomically/mechanistically referable to interval slight swelling of the bed of the stroke overnight, consistent with the natural evolution of a completed stroke.   Electronically signed: Dr. Caryl Pina

## 2021-02-08 NOTE — Progress Notes (Signed)
NAME:  [name] Frank., MRN:  [id number], DOB:  [birth year], LOS: 3 ADMISSION DATE:  02/04/2021, CONSULTATION DATE:  11/27 REFERRING MD:  Almyra Free, CHIEF COMPLAINT:  Fall and AMS   History of Present Illness:  70 year old man who presented to the [facility] ED 11/27 as a level 1 trauma after a fall. PMHx significant for HTN, HLD, Afib (not on [facility]) CAD s/p DES to LAD (03/2014), T2DM, SDH/SAH, status epilepticus, hydrocephalus (s/p VP shunt 9/20).  Patient's LKN was ~1100 11/27, seen by his wife. He was found by his wife ~1700 between the toilet and bathtub in the bathroom and appeared unresponsive. EMS was called.  On ED arrival, initial GCS of 7. He was intubated for airway protection. Afebrile with labs notable for WBC 7.1, lactate 2.4. CT Head and C-spine were negative for intracranial process and demonstrated C-spine fracture or abnormality. VP shunt was noted to be in unchanged position with stable appearance of ventricles. Neurology was consulted. LTM EEG was initiated. CTA Head was completed without large vessel occlusion or basilar thrombosis.  Unfortunately, MRI Brain 11/29 demonstrated acute infarct in the midbrain/cerebral peduncles (L > R) with extension along the third ventricle as well as a small area of infarct in the L occipital lobe. Stroke team was engaged for assistance.  Pertinent Medical History:  SDH/SAH, status epilepticus disorder, hydrocephalus s/p VP shunt (9/20), HTN, HLD, afib, DM2. CAD post DES (01/16) x1 to LAD  Significant Hospital Events: Including procedures, antibiotic start and stop dates in addition to other pertinent events   11/27 presented to [facility] as level 1 trauma. CT Head NAICA. CTA Head/Neck with new focal stenosis M2 branch, focal narrowing of distal L PCA, unchanged L vertebral artery occlusion (V1/V2) with irregular flow in distal V3/prox V4 (retrograde flow), redemonstrated basilar irregularity, no basilar thrombosis. 11/28 LTM EEG showing moderate to severe  diffuse encephalopathy (nonspecific). No seizures. Echo with mildly decreased LVEF from prior, mild LVH, distal anteroseptal and apical hypokinesis (though evaluation limited by exam). MRI pending. EEG discontinued. 11/29 Remains low-grade febrile to Tmax 100.9. Decreased spontaneous LUE movement and decreased pupil size/reactivity, otherwise unchanged neuro exam. MRI Brain demonstrated acute infarct in the midbrain/cerebral peduncles (L > R) with extension along the third ventricle as well as a small area of infarct in the L occipital lobe. NSG consult for CSF valve evaluation post-scan. 11/30 Stroke team engaged. Weaning on vent, PSV 10/5. Neurologic exam grossly unchanged.  Interim History / Subjective:  No significant events overnight Ongoing low grade fevers to Tmax 100.79F Mild hyperglycemia, increased Cr, labs otherwise unremarkable Weaning this AM, PSV 10/5 Neuro exam grossly unchanged, pupils remain nonreactive, +withdrawal to pain, some LUE movement MRI Brain unfortunately demonstrated midbrain infarct Stroke team to see today   Objective:  Blood pressure 132/73, pulse 90, temperature (!) 100.8 F (38.2 C), resp. rate (!) 28, height 5\' 9"  (1.753 m), weight 90.1 kg, SpO2 94 %.    Vent Mode: PSV;CPAP FiO2 (%):  [40 %] 40 % Set Rate:  [18 bmp] 18 bmp Vt Set:  [560 mL] 560 mL PEEP:  [5 cmH20] 5 cmH20 Pressure Support:  [10 cmH20] 10 cmH20 Plateau Pressure:  [17 cmH20-18 cmH20] 17 cmH20   Intake/Output Summary (Last 24 hours) at 02/08/2021 0811 Last data filed at 02/08/2021 0600 Gross per 24 hour  Intake 981.67 ml  Output 1750 ml  Net -768.33 ml    Filed Weights   02/04/2021 1725 02/06/21 0500 02/07/21 0500  Weight: 94 kg 90.4 kg  90.1 kg   Physical Examination: General: Acutely ill-appearing middle-aged man in NAD. HEENT: [place]/AT, anicteric sclera, pupils equal round 39mm, nonreactive to light. Moist mucous membranes. ETT in place. Neuro:  Unresponsive.  Slight facial grimace  with cough/pain, L eye twitching with flashlight evaluation. Withdraws to pain in all 4 extremities, though minimally in RUE. Not following commands. Relative unilateral R-sided neglect noted, spontaneous movement of LUE noted. +Cough, decreased corneal reflexes, no gag. CV: RRR, no m/g/r. PULM: Breathing even and unlabored on vent (PSV 10/5, FiO2 40%). Lung fields diminished at bilateral bases, R > L. GI: Soft, nontender, nondistended. Normoactive bowel sounds. Extremities: No LE edema noted. Skin: Warm/dry, no rashes.  Resolved Hospital Problem List:   Lactic acidosis  Assessment & Plan:  Stroke with acute infarct of midbrain/peduncles HX status epilepticus HX SAH/SDH HX hydrocephalus s/p VP shunt EtOH neg, COVID, FLU A&B neg. Troponin downtrending. Ammonia, B12, folate, TSH WNL. EKG with diffuse T-wave inversions. Lactate minimally elevated. On Keppra. No clear infectious source. Initial differential seizure vs stroke vs. vagal on toilet leading to syncope. Initial CT Head unremarkable on admission. CTA Head/Neck as above, no LVO. MRI Brain 11/29 with subacute midbrain/peduncle infarct In the artery of [place] distribution. Per Neuro note, stroke likely occurred at home pre-hospital and patient was out of TPA window on arrival. - Neuro following; Stroke team to see today, 11/30 - appreciate assistance - Continue AEDs - Neuroprotective measures: HOB > 30 degrees, normoglycemia, normothermia, electrolytes WNL  - F/u pending Cx data, consider CSF eval given persistent low grade fevers, though feel this is less likely infectious and more likely midbrain stroke-related/hypothalamic injury - Further prognostication by stroke team, though based on imaging findings/current neurologic exam, prognosis is poor  Endotracheally intubated GCS 7 on arrival, intubated for airway protection by EDP. - Continue full vent support (4-8cc/kg IBW) - Weaning 11/30 AM, PSV 10/5 tolerating well - Wean FiO2 for  O2 sat > 90% - Daily WUA/SBT - extubation currently precluded by mental status/neurologic exam - VAP bundle - Pulmonary hygiene - PAD protocol for sedation: None at present for ongoing neurologic exam assessment, RASS goal 0 to -1  Fall ?Syncopal event vs stroke. Evaluated by Trauma on arrival and deemed most likely medical cause of AMS/unresponsiveness. CT Head/C-spine negative for acute process. - Now know likely in the setting of acute stroke - Fall precautions  T2DM Hyperglycemia Suspect stress BG-206. Home Invokana held. - CBGs Q4H - Basal glagine 12U daily, increased 11/30 in the setting of hyperglycemia - SSI - Goal CBG 140-180  HX Afib HX CAD post DES (01/16) x1 to LAD HX HTN HX HLD Does not appear to be on AC. NSR on exam. Trop mildly elevated, now downtrending. Echo 11/28 grossly unchanged, LVEF slightly decreased with mild LVH, distal anteroseptal and apical hypokinesis (though evaluation limited by exam) - Hold home antihypertensives, resume as clinically appropriate - ASA/Statin - SQH for DVT ppx  GOC - Poor prognosis in the setting of completed acute midbrain stroke - Discussion and evaluation with stroke team today, 11/30 - they will assist with prognostication - Recommend PMT consult when wife/family amenable  Best Practice (right click and "Reselect all SmartList Selections" daily)   Diet/type: NPO w/ meds via tube DVT prophylaxis: SCD, SQH GI prophylaxis: PPI Lines: N/A Foley:  N/A Code Status:  full code Last date of multidisciplinary goals of care discussion [Discussed with wife Vaughan Basta) at bedside 11/29]  Critical care time: 38 minutes   Lestine Mount, PA-C  [facility] 02/08/21 8:11 AM  Please see [email or web address] for pager details.  From 7A-7P if no response, please call [phone] After hours, please call ELink [phone]

## 2021-02-08 NOTE — Progress Notes (Addendum)
STROKE TEAM PROGRESS NOTE   ATTENDING NOTE: I reviewed above note and agree with the assessment and plan. Pt was seen and examined.   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  CT negative for acute finding.  CTA head and neck showed right M2, left P-comm and left P2 stenosis as well as left VA occlusion.  MRI showed left midbrain, bilateral thalamus extensive infarcts as well as punctate left PCA infarct.  VPS stable although with dural thickening on MRI.  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  EF 50 to 50%, A1c 9.0, ammonia level 16, creatinine 1.3.  LDL pending.  On exam, pt intubated not on sedation, eyes closed, not following commands. With forced eye opening, eyes in need position, not blinking to visual threat, doll's eyes absent, not tracking, pupil 3-1mm bilaterally but no light reaction. Corneal reflex absent, gag absent but cough present. Breathing over the vent on weaning trial.  Facial symmetry not able to test due to ET tube.  Tongue protrusion not cooperative. On pain stimulation, left upper and lower extremity mild withdraw, right upper and lower extremity plegic.  Bilateral positive babinski. Sensation, coordination and gait not tested.  Etiology for patient stroke could be PAF not on AC or small vessel disease given uncontrolled stroke risk factors.  Currently on aspirin 325 and Crestor 5.  Persistent low-grade fever, blood culture negative, CXR right lung base consolidation, UA negative.  May consider empiric antibiotic treatment.  Was on Keppra IV due to no p.o. access, will transition to home Lamictal.  Currently on weaning trial, vent management per CCM.  Given patient extensive brainstem and bilateral thalamus infarcts with  profound mental status change, respiratory failure likely need tracheostomy and PEG tube if aggressive care, baseline low neuro functional status, patient prognosis likely poor.  Discussed with wife at bedside and friend over the phone, updated pt current condition, treatment plan and poor prognosis, and answered all the questions. She expressed understanding and appreciation.  She will discuss with her daughter and son for further decision.  She agrees with palliative care involvement.  We will get a palliative care consult.  For detailed assessment and plan, please refer to above as I have made changes wherever appropriate.   Marvel Plan, MD PhD Stroke Neurology 02/08/2021 6:45 PM  This patient is critically ill due to brainstem and bilateral thalamic infarcts, respiratory failure, fever, PAF not on AC and at significant risk of neurological worsening, death form recurrent stroke, sepsis, heart failure, respiratory failure. This patient's care requires constant monitoring of vital signs, hemodynamics, respiratory and cardiac monitoring, review of multiple databases, neurological assessment, discussion with family, other specialists and medical decision making of high complexity. I spent 50 minutes of neurocritical care time in the care of this patient. I had long discussion with wife at bedside and friend over the phone, updated pt current condition, treatment plan and potential prognosis, and answered all the questions.  They expressed understanding and appreciation.  I also discussed with Dr. Delton Coombes CCM.     INTERVAL HISTORY Patient is seen in his room with his wife at the bedside.  She states that she is waiting for their children, who are out of town, to come to the bedside so decisions about goals of care can be made.  Patient  was found to have had an artery of Percheron stroke and this diagnosis and prognosis were discussed with patient's wife at length.  He has been hemodynamically stable and  his neurological exam is stable.  Vitals:   02/08/21 1500 02/08/21 1532 02/08/21 1547 02/08/21 1600  BP: 119/67   (!) 103/56  Pulse: 89 91 91 87  Resp: (!) 29 (!) 29 (!) 28 (!) 24  Temp: (!) 100.9 F (38.3 C)  (!) 100.9 F (38.3 C) (!) 101.3 F (38.5 C)  TempSrc:      SpO2: 94% 96% 93% 94%  Weight:      Height:       CBC:  Recent Labs  Lab 02/07/21 0340 02/08/21 0414  WBC 8.3 7.6  NEUTROABS 6.5 6.5  HGB 17.5* 16.2  HCT 51.5 49.1  MCV 87.3 89.3  PLT 187 [id number]   Basic Metabolic Panel:  Recent Labs  Lab 02/07/21 0340 02/08/21 0414  NA 139 138  K 3.8 4.3  CL 105 106  CO2 21* 18*  GLUCOSE 199* 256*  BUN 20 35*  CREATININE 1.09 1.30*  CALCIUM 8.8* 8.8*  MG 2.2 2.5*  PHOS 3.3 3.2   Lipid Panel:  Recent Labs  Lab 02/06/21 0413  TRIG 200*   HgbA1c:  Recent Labs  Lab 01/30/2021 2128  HGBA1C 9.0*   Urine Drug Screen:  Recent Labs  Lab 01/29/2021 2016  LABOPIA NONE DETECTED  COCAINSCRNUR NONE DETECTED  LABBENZ NONE DETECTED  AMPHETMU NONE DETECTED  THCU NONE DETECTED  LABBARB NONE DETECTED    Alcohol Level  Recent Labs  Lab 01/29/2021 2317  ETH <10    IMAGING past 24 hours DG CHEST PORT 1 VIEW  Result Date: 02/08/2021 CLINICAL DATA:  Fever EXAM: PORTABLE CHEST 1 VIEW COMPARISON:  01/29/2021 FINDINGS: Endotracheal tube and partially imaged enteric tube and shunt catheter. Low lung volumes. Patchy density at the right lung base. No pleural effusion. Stable cardiomediastinal contours. IMPRESSION: Patchy atelectasis/consolidation at the right lung base. Electronically Signed   By: Macy Mis M.D.   On: 02/08/2021 09:00    PHYSICAL EXAM General:  Intubated, well-developed patient in no acute distress  Neurological:  Patient does not respond to voice.  Pupils non-reactive with left pupil slightly oval shaped, equal in size.  Corneal reflex present, oculocephalic reflex absent.  Cough reflex present, gag reflex absent.  No movement to noxious stimuli in RUE  and RLE, slight nonpurposeful movement in LUE and LLE.  Babinski reflex positive bilaterally.  ASSESSMENT/PLAN [name] Frank. is a 70 y.o. 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  His prognosis is poor, and he would likely require a tracheostomy and PEG tube if aggressive care measures were continued.  Lengthy discussion of patient's prognosis and the likely course of his illness was had with patient's wife.  Patient's children are traveling in from out of town, and the family intends to make a decision on goals of care when they arrive.  Palliative care consult placed.  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 VP shunt in place CTA head & neck Focal stenosis in right M2, stenosis in left P2, left vertebral artery occlusion in V1 and V2 segments,  no basilar thrombus MRI  Acute infarct in midbrain and cerebral pedunclues, consitent with artery of Percheron infarct, small left occipital lobe infarct and stable VP shunt 2D Echo EF 50-55%, mild LVH, no atrial level shunt LDL 148 HgbA1c 9.0 VTE prophylaxis - SCDs    Diet   Diet NPO time specified   aspirin 81 mg daily prior to admission, now on aspirin 325 mg daily.  Therapy recommendations:  pending Disposition:  pending  Hypertension Home meds:  none Stable SBP goal <180 Long-term BP goal normotensive  Hyperlipidemia Home meds:  rosuvastatin 5 mg daily, resumed in hospital LDL 148, goal < 70 High intensity statin to be added depending on goals of care decision Continue statin at discharge  Diabetes type II Uncontrolled Home meds:  Invokana 100 mg daily HgbA1c 9.0, goal < 7.0 CBGs Recent Labs     02/08/21 0736 02/08/21 1302 02/08/21 1557  GLUCAP 268* 346* 387*    SSI Diabetes management consult depending on goals of care discussion  Paroxysmal Atrial Fibrillation Patient was not taking anticoagulation at home NSR seen on monitor Continue cardiac monitoring, may consider anticoagulation in the future if aggressive care  History of ICH with hydrocephalus VP shunt functioning normally  Respiratory failure Patient intubated due to inability to protect airway Ventilator management per primary team  Other Stroke Risk Factors Advanced Age >/= 69  Hx stroke/TIA  Other Active Problems VP shunt in place Shunt operating normally  Hospital day # [place] , MSN, AGACNP-BC [facility] See Amion for schedule and pager information 02/08/2021 5:24 PM    To contact Stroke Continuity provider, please refer to [email or web address] After hours, contact General Neurology

## 2021-02-09 DIAGNOSIS — I6312 Cerebral infarction due to embolism of basilar artery: Secondary | ICD-10-CM | POA: Diagnosis not present

## 2021-02-09 DIAGNOSIS — I480 Paroxysmal atrial fibrillation: Secondary | ICD-10-CM

## 2021-02-09 DIAGNOSIS — J9601 Acute respiratory failure with hypoxia: Secondary | ICD-10-CM | POA: Diagnosis not present

## 2021-02-09 LAB — CBC
HCT: 45.9 % (ref 39.0–52.0)
Hemoglobin: 14.9 g/dL (ref 13.0–17.0)
MCH: 28.9 pg (ref 26.0–34.0)
MCHC: 32.5 g/dL (ref 30.0–36.0)
MCV: 89 fL (ref 80.0–100.0)
Platelets: 176 10*3/uL (ref 150–400)
RBC: 5.16 MIL/uL (ref 4.22–5.81)
RDW: 13.4 % (ref 11.5–15.5)
WBC: 5.3 10*3/uL (ref 4.0–10.5)
nRBC: 0 % (ref 0.0–0.2)

## 2021-02-09 LAB — GLUCOSE, CAPILLARY
Glucose-Capillary: 300 mg/dL — ABNORMAL HIGH (ref 70–99)
Glucose-Capillary: 290 mg/dL — ABNORMAL HIGH (ref 70–99)
Glucose-Capillary: 349 mg/dL — ABNORMAL HIGH (ref 70–99)
Glucose-Capillary: 313 mg/dL — ABNORMAL HIGH (ref 70–99)
Glucose-Capillary: 250 mg/dL — ABNORMAL HIGH (ref 70–99)
Glucose-Capillary: 229 mg/dL — ABNORMAL HIGH (ref 70–99)

## 2021-02-09 LAB — BASIC METABOLIC PANEL
Anion gap: 8 (ref 5–15)
BUN: 38 mg/dL — ABNORMAL HIGH (ref 8–23)
CO2: 24 mmol/L (ref 22–32)
Calcium: 8.8 mg/dL — ABNORMAL LOW (ref 8.9–10.3)
Chloride: 110 mmol/L (ref 98–111)
Creatinine, Ser: 0.92 mg/dL (ref 0.61–1.24)
GFR, Estimated: >60 mL/min (ref >60)
Glucose, Bld: 292 mg/dL — ABNORMAL HIGH (ref 70–99)
Potassium: 4.1 mmol/L (ref 3.5–5.1)
Sodium: 142 mmol/L (ref 135–145)

## 2021-02-09 LAB — LIPID PANEL
Cholesterol: 168 mg/dL (ref 0–200)
HDL: 33 mg/dL — ABNORMAL LOW (ref >40)
LDL Cholesterol: 106 mg/dL — ABNORMAL HIGH (ref 0–99)
Total CHOL/HDL Ratio: 5.1 RATIO
Triglycerides: 143 mg/dL (ref <150)
VLDL: 29 mg/dL (ref 0–40)

## 2021-02-09 LAB — PHOSPHORUS
Phosphorus: 2.1 mg/dL — ABNORMAL LOW (ref 2.5–4.6)
Phosphorus: 3.2 mg/dL (ref 2.5–4.6)

## 2021-02-09 LAB — MAGNESIUM
Magnesium: 2.7 mg/dL — ABNORMAL HIGH (ref 1.7–2.4)
Magnesium: 2.8 mg/dL — ABNORMAL HIGH (ref 1.7–2.4)

## 2021-02-09 MED ORDER — INSULIN GLARGINE-YFGN 100 UNIT/ML ~~LOC~~ SOLN
16.0000 [IU] | Freq: Every day | SUBCUTANEOUS | Status: DC
  Administered 2021-02-09 – 2021-02-12 (×4): 16 [IU] via SUBCUTANEOUS
  Filled 2021-02-09 (×4): qty 0.16

## 2021-02-09 MED ORDER — INSULIN ASPART 100 UNIT/ML IJ SOLN
6.0000 [IU] | INTRAMUSCULAR | Status: DC
  Administered 2021-02-09 – 2021-02-12 (×18): 6 [IU] via SUBCUTANEOUS

## 2021-02-09 MED ORDER — SODIUM PHOSPHATES 45 MMOLE/15ML IV SOLN
15.0000 mmol | Freq: Once | INTRAVENOUS | Status: AC
  Administered 2021-02-09: 15 mmol via INTRAVENOUS
  Filled 2021-02-09: qty 5

## 2021-02-09 NOTE — Progress Notes (Addendum)
STROKE TEAM PROGRESS NOTE   INTERVAL HISTORY Patient is seen in his room with no family at the bedside.  His daughter has arrived in town and his sone should arrive some time today.  Palliative care was consulted to assist family with GOC discussion.  Patient has been hemodynamically stable overnight with slight improvement in his neurological exam.  Vitals:   02/09/21 0900 02/09/21 1000 02/09/21 1100 02/09/21 1133  BP: 126/72 111/63 108/65 118/63  Pulse: 71 72 63 63  Resp: 18 18 18 20   Temp:      TempSrc:      SpO2: 95% 93% 92% 95%  Weight:      Height:       CBC:  Recent Labs  Lab 02/07/21 0340 02/08/21 0414 02/09/21 0502  WBC 8.3 7.6 5.3  NEUTROABS 6.5 6.5  --   HGB 17.5* 16.2 14.9  HCT 51.5 49.1 45.9  MCV 87.3 89.3 89.0  PLT 187 173 176    Basic Metabolic Panel:  Recent Labs  Lab 02/08/21 0414 02/08/21 1636 02/09/21 0502  NA 138  --  142  K 4.3  --  4.1  CL 106  --  110  CO2 18*  --  24  GLUCOSE 256*  --  292*  BUN 35*  --  38*  CREATININE 1.30*  --  0.92  CALCIUM 8.8*  --  8.8*  MG 2.5* 2.8* 2.7*  PHOS 3.2 2.8 2.1*    Lipid Panel:  Recent Labs  Lab 02/09/21 0502  CHOL 168  TRIG 143  HDL 33*  CHOLHDL 5.1  VLDL 29  LDLCALC 14/01/22*    HgbA1c:  Recent Labs  Lab 17-Feb-2021 2128  HGBA1C 9.0*    Urine Drug Screen:  Recent Labs  Lab 2021/02/17 2016  LABOPIA NONE DETECTED  COCAINSCRNUR NONE DETECTED  LABBENZ NONE DETECTED  AMPHETMU NONE DETECTED  THCU NONE DETECTED  LABBARB NONE DETECTED     Alcohol Level  Recent Labs  Lab February 17, 2021 2317  ETH <10     IMAGING past 24 hours No results found.  PHYSICAL EXAM General:  Intubated, well-developed patient in no acute distress  Neurological:  Patient will inconsistently follow simple commands to wiggle toes or move fingers.  Pupils nonreactive, corneal reflex present, no oculocephalic reflex, cough and gag reflexes present.  He does not blink to threat. No movement on right side to noxious  stimulus, will intermittently have nonpurposeful movement on left.  ASSESSMENT/PLAN [name]. is a 70 y.o. 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  His prognosis is poor, and he would likely require a tracheostomy and PEG tube if aggressive care measures were continued.  Lengthy discussion of patient's prognosis and the likely course of his illness was had with patient's wife.  Patient's children are traveling in from out of town, and the family intends to make a decision on goals of care when they arrive.  Palliative care consult placed.  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 VP shunt in place CTA head & neck Focal stenosis in right M2, stenosis in left P2, left vertebral artery occlusion in V1 and V2 segments, no basilar thrombus MRI  Acute infarct in  midbrain and cerebral pedunclues, consitent with artery of Percheron infarct, small left occipital lobe infarct and stable VP shunt 2D Echo EF 50-55%, mild LVH, no atrial level shunt LDL 148 HgbA1c 9.0 VTE prophylaxis - SCDs aspirin 81 mg daily prior to admission, now on aspirin 325 mg daily.  Therapy recommendations:  pending Disposition:  pending  Hypertension Home meds:  none Stable SBP goal <180 Long-term BP goal normotensive  Hyperlipidemia Home meds:  rosuvastatin 5 mg daily, resumed in hospital LDL 148, goal < 70 High intensity statin to be added depending on goals of care decision Continue statin at discharge  Diabetes type II Uncontrolled Home meds:  Invokana 100 mg daily HgbA1c 9.0, goal < 7.0 CBGs Hyperglycemia SSI Novolog 6 units q6h Diabetes management consult depending  on goals of care discussion  Paroxysmal Atrial Fibrillation Patient was not taking anticoagulation at home NSR seen on monitor Continue cardiac monitoring, may consider anticoagulation in the future if aggressive care  History of traumatic SDH/SAH with hydrocephalus S/p VPS VP shunt functioning normally Followed by Dr. Zada Finders  Respiratory failure Patient intubated due to inability to protect airway Ventilator management per primary team  Other Stroke Risk Factors Advanced Age >/= 17  Hx stroke/TIA  Other Active Problems VP shunt in place Shunt operating normally  Hospital day # [place] , MSN, AGACNP-BC [facility] See Amion for schedule and pager information 02/09/2021 1:48 PM   ATTENDING NOTE: I reviewed above note and agree with the assessment and plan. Pt was seen and examined.   No family at bedside.  Patient still intubated, on vent, per report, not tolerating weaning due to apnea.  Not open eyes on voice, with forced eye opening, eyes mid position, left pupil 3.5 mm, right pupil is 3 mm, nonreactive to light, slight corneal on the left but absent corneal on the right.  Strong gag and cough reflex.  Right hemiplegia, left upper extremity 3 -/5, left lower extremity mild withdraw to pain.  Intermittent follow commands of left foot toe wiggling, however not currently under simple commands.  Fever resolved, afebrile today, no leukocytosis.  Continue aspirin, statin and tube feeding.  Patient son will arrive tonight, daughter arrived last night, family will get her together for family meeting tomorrow.  Palliative care also consulted.  For detailed assessment and plan, please refer to above as I have made changes wherever appropriate.   Rosalin Hawking, MD PhD Stroke Neurology 02/09/2021 8:28 PM    To contact Stroke Continuity provider, please refer to [email or web address] After hours, contact General Neurology

## 2021-02-09 NOTE — Progress Notes (Signed)
Inpatient Diabetes Program Recommendations  AACE/ADA: New Consensus Statement on Inpatient Glycemic Control (2015)  Target Ranges:  Prepandial:   less than 140 mg/dL      Peak postprandial:   less than 180 mg/dL (1-2 hours)      Critically ill patients:  140 - 180 mg/dL   Lab Results  Component Value Date   GLUCAP 290 (H) 02/09/2021   HGBA1C 9.0 (H) 2021-02-22    Review of Glycemic Control  Latest Reference Range & Units 02/08/21 16:52 02/08/21 18:28 02/08/21 19:38 02/08/21 23:29 02/09/21 03:15 02/09/21 07:53  Glucose-Capillary 70 - 99 mg/dL 510 (H) 258 (H) 527 (H) 238 (H) 300 (H) 290 (H)  (H): Data is abnormally high  Current orders for Inpatient glycemic control: Novolog 0-15 units Q4H, Novolog 3 units Q4H, Semglee 16 units QD   Inpatient Diabetes Program Recommendations:    Novolog 6 units Q4H  Will continue to follow while inpatient.  Thank you, Dulce Sellar, RN, BSN Diabetes Coordinator Inpatient Diabetes Program [phone] (team pager from 8a-5p)

## 2021-02-09 NOTE — Progress Notes (Signed)
NAME:  [name] Frank., MRN:  [id number], DOB:  07-Dec-1950, LOS: 4 ADMISSION DATE:  01/29/2021, CONSULTATION DATE:  11/27 REFERRING MD:  Almyra Free, CHIEF COMPLAINT:  Fall and AMS   History of Present Illness:  70 year old man who presented to the [facility] ED 11/27 as a level 1 trauma after a fall. PMHx significant for HTN, HLD, Afib (not on [facility]) CAD s/p DES to LAD (03/2014), T2DM, SDH/SAH, status epilepticus, hydrocephalus (s/p VP shunt 9/20).  Patient's LKN was ~1100 11/27, seen by his wife. He was found by his wife ~1700 between the toilet and bathtub in the bathroom and appeared unresponsive. EMS was called.  On ED arrival, initial GCS of 7. He was intubated for airway protection. Afebrile with labs notable for WBC 7.1, lactate 2.4. CT Head and C-spine were negative for intracranial process and demonstrated C-spine fracture or abnormality. VP shunt was noted to be in unchanged position with stable appearance of ventricles. Neurology was consulted. LTM EEG was initiated. CTA Head was completed without large vessel occlusion or basilar thrombosis.  Unfortunately, MRI Brain 11/29 demonstrated acute infarct in the midbrain/cerebral peduncles (L > R) with extension along the third ventricle as well as a small area of infarct in the L occipital lobe. Stroke team was engaged for assistance.  Pertinent Medical History:  SDH/SAH, status epilepticus disorder, hydrocephalus s/p VP shunt (9/20), HTN, HLD, afib, DM2. CAD post DES (01/16) x1 to LAD  Significant Hospital Events: Including procedures, antibiotic start and stop dates in addition to other pertinent events   11/27 presented to [facility] as level 1 trauma. CT Head NAICA. CTA Head/Neck with new focal stenosis M2 branch, focal narrowing of distal L PCA, unchanged L vertebral artery occlusion (V1/V2) with irregular flow in distal V3/prox V4 (retrograde flow), redemonstrated basilar irregularity, no basilar thrombosis. 11/28 LTM EEG showing moderate to severe  diffuse encephalopathy (nonspecific). No seizures. Echo with mildly decreased LVEF from prior, mild LVH, distal anteroseptal and apical hypokinesis (though evaluation limited by exam). MRI pending. EEG discontinued. 11/29 Remains low-grade febrile to Tmax 100.9. Decreased spontaneous LUE movement and decreased pupil size/reactivity, otherwise unchanged neuro exam. MRI Brain demonstrated acute infarct in the midbrain/cerebral peduncles (L > R) with extension along the third ventricle as well as a small area of infarct in the L occipital lobe. NSG consult for CSF valve evaluation post-scan. 11/30 Stroke team engaged. Weaning on vent, PSV 10/5. Neurologic exam grossly unchanged.  Interim History / Subjective:   No new issues reported overnight I/O- 3.9 L total CBG consistently greater than 220, range 222-387   Objective:  Blood pressure 115/65, pulse 60, temperature 99.3 F (37.4 C), temperature source Bladder, resp. rate 18, height 5\' 9"  (1.753 m), weight 90.1 kg, SpO2 96 %.    Vent Mode: PRVC FiO2 (%):  [40 %] 40 % Set Rate:  [18 bmp] 18 bmp Vt Set:  [560 mL] 560 mL PEEP:  [5 cmH20] 5 cmH20 Pressure Support:  [10 cmH20] 10 cmH20 Plateau Pressure:  [16 cmH20-22 cmH20] 18 cmH20   Intake/Output Summary (Last 24 hours) at 02/09/2021 [id number] Last data filed at 02/09/2021 0600 Gross per 24 hour  Intake 1590.03 ml  Output 1900 ml  Net -309.97 ml   Filed Weights   02/07/2021 1725 02/06/21 0500 02/07/21 0500  Weight: 94 kg 90.4 kg 90.1 kg   Physical Examination: General: Acute and chronically ill-appearing obese gentleman ventilated HEENT: ET tube in place, oropharynx otherwise clear, pupils equal and react Neuro: Grimace with pain,  does not wake to voice, does not open eyes, does not follow commands.  He withdraws to pain in the left upper extremity, both lower extremities.  He does not move his right arm.  He does have spontaneous dry debris CV: Regular, distant, no murmur PULM: Decreased to  both bases, otherwise clear GI: Nondistended, positive bowel sounds Extremities: No lower extremity edema Skin: No rash  Resolved Hospital Problem List:   Lactic acidosis  Assessment & Plan:  Stroke with acute infarct of midbrain/peduncles HX status epilepticus HX SAH/SDH HX hydrocephalus s/p VP shunt MRI Brain 11/29 with subacute midbrain/peduncle infarct In the artery of Percheron distribution. Per Neuro note, stroke likely occurred at home pre-hospital and patient was out of TPA window on arrival. -Appreciate Dr. Denyse Amass input and assistance. -Aspirin, statin as ordered -AED as ordered -Neuroprotective measures, will work to push for improvement in glycemic control -Prognosis here unfortunately poor for significant recovery.  Suspect that he will be dependent for his care, will require trach and PEG.  Family considering options.  Palliative care medicine consulted by neurology on 11/30   Endotracheally intubated -continue PRVC 8 cc/kg -Okay for PSV as tolerated although he does not have the mental status 1 activation. -He would require tracheostomy if we were going to push forward -VAP prevention order set -Minimal sedation, PAD protocol in place.  RASS goal -1 to 0  Fall ?Syncopal event vs stroke. Evaluated by Trauma on arrival and deemed most likely medical cause of AMS/unresponsiveness. CT Head/C-spine negative for acute process. --In the setting of acute CVA -Fall precautions  T2DM Hyperglycemia Suspect stress [id number]. Home Invokana held. -CBG every 4 hours -Increase basal glargine insulin on 12/1 to 16 units daily -Continue sliding scale insulin -Goal CBG <180  HX Afib HX CAD post DES (01/16) x1 to LAD HX HTN HX HLD Does not appear to be on AC. NSR on exam. Trop mildly elevated, now downtrending. Echo 11/28 grossly unchanged, LVEF slightly decreased with mild LVH, distal anteroseptal and apical hypokinesis (though evaluation limited by exam) -Home antihypertensives  on hold.  Can restart if his blood pressure begins to rise -ASA, statin as ordered -Heparin, subcutaneous for DVT prophylaxis  GOC -Appreciate neurology input here.  [facility] care medicine consulted.  Looks like this is a debilitating injury, would require him to undergo tracheostomy, PEG tube, dependent care.   Best Practice (right click and "Reselect all SmartList Selections" daily)   Diet/type: NPO w/ meds via tube DVT prophylaxis: SCD, SQH GI prophylaxis: PPI Lines: N/A Foley:  N/A Code Status:  full code Last date of multidisciplinary goals of care discussion [Discussed with wife Bonita Quin) at bedside 11/29]  Critical care time: 31 minutes    Levy Pupa, MD, PhD 02/09/2021, 7:30 AM [facility] [phone] or if no answer before 7:00PM call [phone] For any issues after 7:00PM please call eLink [phone]

## 2021-02-09 DEATH — deceased

## 2021-02-10 ENCOUNTER — Other Ambulatory Visit: Payer: Self-pay

## 2021-02-10 DIAGNOSIS — J988 Other specified respiratory disorders: Secondary | ICD-10-CM | POA: Diagnosis not present

## 2021-02-10 DIAGNOSIS — R401 Stupor: Secondary | ICD-10-CM | POA: Diagnosis not present

## 2021-02-10 LAB — BASIC METABOLIC PANEL
Anion gap: 10 (ref 5–15)
BUN: 43 mg/dL — ABNORMAL HIGH (ref 8–23)
CO2: 26 mmol/L (ref 22–32)
Calcium: 9.1 mg/dL (ref 8.9–10.3)
Chloride: 111 mmol/L (ref 98–111)
Creatinine, Ser: 0.91 mg/dL (ref 0.61–1.24)
GFR, Estimated: >60 mL/min (ref >60)
Glucose, Bld: 278 mg/dL — ABNORMAL HIGH (ref 70–99)
Potassium: 4.4 mmol/L (ref 3.5–5.1)
Sodium: 147 mmol/L — ABNORMAL HIGH (ref 135–145)

## 2021-02-10 LAB — CULTURE, BLOOD (ROUTINE X 2)
Culture: NO GROWTH
Culture: NO GROWTH
Special Requests: ADEQUATE
Special Requests: ADEQUATE

## 2021-02-10 LAB — GLUCOSE, CAPILLARY
Glucose-Capillary: 210 mg/dL — ABNORMAL HIGH (ref 70–99)
Glucose-Capillary: 251 mg/dL — ABNORMAL HIGH (ref 70–99)
Glucose-Capillary: 259 mg/dL — ABNORMAL HIGH (ref 70–99)
Glucose-Capillary: 227 mg/dL — ABNORMAL HIGH (ref 70–99)
Glucose-Capillary: 279 mg/dL — ABNORMAL HIGH (ref 70–99)
Glucose-Capillary: 309 mg/dL — ABNORMAL HIGH (ref 70–99)

## 2021-02-10 LAB — MAGNESIUM: Magnesium: 2.7 mg/dL — ABNORMAL HIGH (ref 1.7–2.4)

## 2021-02-10 NOTE — Progress Notes (Signed)
Palliative-   Consult received for GOC discussion for patient with likely debilitating CVA, currently intubated.  Chart reviewed.  Family members who were present at bedside are listed as members who should not receive any medical information and are not decision makers.  Called patient's spouse to schedule meeting.  Spouse and children are not available for meeting until tomorrow morning.  Palliative team member will attempt to schedule a meeting tomorrow.   Ocie Bob, AGNP-C Palliative Medicine  Please call Palliative Medicine team phone with any questions [phone]. For individual providers please see AMION.  No charge

## 2021-02-10 NOTE — Progress Notes (Signed)
Inpatient Diabetes Program Recommendations  AACE/ADA: New Consensus Statement on Inpatient Glycemic Control (2015)  Target Ranges:  Prepandial:   less than 140 mg/dL      Peak postprandial:   less than 180 mg/dL (1-2 hours)      Critically ill patients:  140 - 180 mg/dL   Lab Results  Component Value Date   GLUCAP 259 (H) 02/10/2021   HGBA1C 9.0 (H) 14-Feb-2021    Review of Glycemic Control  Latest Reference Range & Units 02/10/21 03:36 02/10/21 07:44 02/10/21 11:30  Glucose-Capillary 70 - 99 mg/dL 989 (H) 211 (H) 941 (H)  (H): Data is abnormally high Current orders for Inpatient glycemic control: Novolog 0-15 units Q4H, Novolog 6 units Q4H, Semglee 16 units QD    Inpatient Diabetes Program Recommendations:     Consider increasing TF coverage to Novolog 10 units Q4H if appropriate with goals of care.   Will continue to follow while inpatient.   Thanks, Lujean Rave, MSN, RNC-OB Diabetes Coordinator [phone] (8a-5p)

## 2021-02-10 NOTE — Progress Notes (Addendum)
STROKE TEAM PROGRESS NOTE   INTERVAL HISTORY Patient is seen in his room with his wife and daughter at the bedside. Palliative care was consulted to assist family with GOC discussion.  Expect them to see him and talk to family today. Patient has been hemodynamically stable overnight with no changes in his neurological exam. He is on pressure support 10/5 and 40%.   Vitals:   02/10/21 0600 02/10/21 0700 02/10/21 0745 02/10/21 0800  BP: 127/71 (!) 141/68 122/69 (!) 141/73  Pulse: 63 65 65 78  Resp: 18 18 18  (!) 23  Temp:    98.9 F (37.2 C)  TempSrc:    Esophageal  SpO2: 98% 95% 96% 96%  Weight:      Height:       CBC:  Recent Labs  Lab 02/07/21 0340 02/08/21 0414 02/09/21 0502  WBC 8.3 7.6 5.3  NEUTROABS 6.5 6.5  --   HGB 17.5* 16.2 14.9  HCT 51.5 49.1 45.9  MCV 87.3 89.3 89.0  PLT 187 173 176    Basic Metabolic Panel:  Recent Labs  Lab 02/09/21 0502 02/09/21 1701 02/10/21 0515  NA 142  --  147*  K 4.1  --  4.4  CL 110  --  111  CO2 24  --  26  GLUCOSE 292*  --  278*  BUN 38*  --  43*  CREATININE 0.92  --  0.91  CALCIUM 8.8*  --  9.1  MG 2.7* 2.8* 2.7*  PHOS 2.1* 3.2  --     Lipid Panel:  Recent Labs  Lab 02/09/21 0502  CHOL 168  TRIG 143  HDL 33*  CHOLHDL 5.1  VLDL 29  LDLCALC 14/01/22*    HgbA1c:  Recent Labs  Lab 01/21/2021 2128  HGBA1C 9.0*    Urine Drug Screen:  Recent Labs  Lab 02/03/2021 2016  LABOPIA NONE DETECTED  COCAINSCRNUR NONE DETECTED  LABBENZ NONE DETECTED  AMPHETMU NONE DETECTED  THCU NONE DETECTED  LABBARB NONE DETECTED     Alcohol Level  Recent Labs  Lab 01/15/2021 2317  ETH <10     IMAGING past 24 hours No results found.  PHYSICAL EXAM General:  Intubated, well-developed patient in no acute distress  Neurological:  Patient will inconsistently follow simple commands to wiggle toes or move fingers.  Pupils nonreactive, corneal reflex present, no oculocephalic reflex, cough and gag reflexes present.  He does not blink  to threat. No movement on right side to noxious stimulus, will intermittently have nonpurposeful movement on left.  ASSESSMENT/PLAN [name] Frank. is a 70 y.o. 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  His prognosis is poor, and he would likely require a tracheostomy and PEG tube if aggressive care measures were continued.  Lengthy discussion of patient's prognosis and the likely course of his illness was had with patient's wife.  Patient's children are traveling in from out of town, and the family intends to make a decision on goals of care when they arrive.  Palliative care consult placed.  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, no basilar thrombus MRI  Acute infarct in midbrain and cerebral pedunclues, consitent with artery of Percheron infarct, small left occipital lobe infarct and stable VP shunt 2D Echo EF 50-55%, mild LVH, no atrial level shunt LDL 148 HgbA1c 9.0 VTE prophylaxis - SCDs aspirin 81 mg daily prior to admission, now on aspirin 325mg   Therapy recommendations:  pending Disposition:  pending  Hypertension Home meds:  none Stable SBP goal <180 Long-term BP goal normotensive  Hyperlipidemia Home meds:  rosuvastatin 5 mg daily, resumed in hospital LDL 148, goal < 70 High intensity statin to be added depending on goals of care decision Continue statin at discharge  Diabetes type II Uncontrolled Home meds:  Invokana 100 mg daily HgbA1c 9.0, goal < 7.0 CBGs Hyperglycemia SSI Novolog 6 units  q6h Diabetes management consult depending on goals of care discussion  Paroxysmal Atrial Fibrillation Patient was not taking anticoagulation at home NSR seen on monitor Continue cardiac monitoring, may consider anticoagulation in the future if aggressive care  History of traumatic SDH/SAH with hydrocephalus S/p VPS VP shunt functioning normally Followed by Dr. Zada Frank  Respiratory failure Patient intubated due to inability to protect airway On weaning now but did not tolerate 12/1 but seems tolerating 12/2 Ventilator management per primary team  Other Stroke Risk Factors Advanced Age >/= 11  Hx stroke/TIA  Other Active Problems VP shunt in place Shunt operating normally  Hospital day # [place] , [place], FNP-BC [facility] See Amion for schedule and pager information 02/10/2021 9:17 AM   ATTENDING NOTE: I reviewed above note and agree with the assessment and plan. Pt was seen and examined.   Wife and daughter are at bedside.  Patient son came back from [place] and is in the waiting room.  Patient still intubated, on weaning this morning, however did not follow commands on my exam.  Per RN, patient seems intermittently follow with left toe wiggling.  Neuro exam unchanged from yesterday.  Still poor prognosis.  Discussed with daughter and wife at bedside again, reviewed neuroimaging.  They agree with palliative care services on board for [place] discussion.  For detailed assessment and plan, please refer to above as I have made changes wherever appropriate.   Rosalin Hawking, MD PhD Stroke Neurology 02/10/2021 7:25 PM  This patient is critically ill due to brainstem and bilateral thalamic infarcts, respiratory failure, PAF not on AC, fever and at significant risk of neurological worsening, death form recurrent stroke, sepsis, heart failure, respiratory failure, seizure. This patient's care requires constant monitoring of vital signs, hemodynamics, respiratory and  cardiac monitoring, review of multiple databases, neurological assessment, discussion with family, other specialists and medical decision making of high complexity. I spent 35 minutes of neurocritical care time in the care of this patient. I had long discussion with wife and daughter at bedside, reviewed neuro images, updated pt current condition, treatment plan and potential prognosis, and answered all the questions.  They expressed understanding and appreciation.       To contact Stroke Continuity provider, please refer to [email or web address] After hours, contact General Neurology

## 2021-02-10 NOTE — Progress Notes (Addendum)
Patient's wife specified with this RN who is allowed to call for information on the patient.  Allowed:   [name] Frank (spouse) [phone] [name] Frank (daughter) [phone] [name] Frank (son) no phone number   NOT ALLOWED TO GET INFO ON PATIENT:  (they are allowed to visit but wife wants no decisions and conversations related to care) [name]

## 2021-02-10 NOTE — Progress Notes (Signed)
NAME:  Yoshihiro, Trembath MRN:  [id number], DOB:  Feb 02, 1951, LOS: 5 ADMISSION DATE:  01/11/2021, CONSULTATION DATE:  11/27 REFERRING MD:  Almyra Free, CHIEF COMPLAINT:  Fall and AMS   History of Present Illness:  70 yo male brought to ER after falling and unresponsive.  GCS 7 in ED and he required intubation.  Initial neuroimaging and EEG were unrevealing.  He had MRI brain on 02/07/21 that showed acute infarct in the midbrain/cerebral peduncles with extension along the 3rd ventricle as well as small area of infarct in Lt occipital lobe.  Pertinent Medical History:  SDH/SAH, status epilepticus disorder, hydrocephalus s/p VP shunt (9/20), HTN, HLD, afib, DM2. 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 CT Head NAICA. CTA Head/Neck with new focal stenosis M2 branch, focal narrowing of distal L PCA, unchanged L vertebral artery occlusion (V1/V2) with irregular flow in distal V3/prox V4 (retrograde flow), redemonstrated basilar irregularity, no basilar thrombosis. 11/28 LTM EEG showing moderate to severe diffuse encephalopathy (nonspecific). No seizures. Echo with mildly decreased LVEF from prior, mild LVH, distal anteroseptal and apical hypokinesis (though evaluation limited by exam). MRI pending. EEG discontinued. 11/29 Remains low-grade febrile to Tmax 100.9. Decreased spontaneous LUE movement and decreased pupil size/reactivity, otherwise unchanged neuro exam. MRI Brain demonstrated acute infarct in the midbrain/cerebral peduncles (L > R) with extension along the third ventricle as well as a small area of infarct in the L occipital lobe. NSG consult for CSF valve evaluation post-scan. 11/30 Stroke team engaged. Weaning on vent, PSV 10/5. Neurologic exam grossly unchanged.  Interim History / Subjective:  Tolerating pressure support.  No improvement in  neuro status.  Objective:  Blood pressure (!) 141/73, pulse 78, temperature 98.9 F (37.2 C), temperature source Esophageal, resp. rate (!) 23, height 5\' 9"  (1.753 m), weight 90 kg, SpO2 96 %.    Vent Mode: PSV;CPAP FiO2 (%):  [40 %] 40 % Set Rate:  [18 bmp] 18 bmp Vt Set:  [560 mL] 560 mL PEEP:  [5 cmH20] 5 cmH20 Pressure Support:  [10 cmH20] 10 cmH20 Plateau Pressure:  [18 cmH20-19 cmH20] 18 cmH20   Intake/Output Summary (Last 24 hours) at 02/10/2021 0943 Last data filed at 02/10/2021 0800 Gross per 24 hour  Intake 1622.41 ml  Output 1200 ml  Net 422.41 ml   Filed Weights   02/06/21 0500 02/07/21 0500 02/10/21 0500  Weight: 90.4 kg 90.1 kg 90 kg   Physical Examination:  General - on vent Eyes - pupils non reactive ENT - ETT in place Cardiac - regular rate/rhythm, no murmur Chest - equal breath sounds b/l, no wheezing or rales Abdomen - soft, non tender, + bowel sounds Extremities - no cyanosis, clubbing, or edema Skin - no rashes Neuro - no response to stimuli   Resolved Hospital Problem List:   Lactic acidosis  Assessment & Plan:   Acute infarct in midbrain and cerebral pedunclues, consitent with artery of Percheron infarct, small left occipital lobe infarct and stable VP shunt. Hx of SAH/SDH, seizures. - neuro prognosis seems poor - family likely leaning toward comfort measures - palliative care consulted  Compromised airway. - family likely would not want trach/peg and long term support  DM type 2 poorly controlled with hyperglycemia. - SSI  Urine retention. - will have foley catheter placed since he is likely heading toward comfort measures  Hx of A fib, CAD s/p DES, HTN, HLD. - continue ASA, crestor  Goals of care. - had detailed d/w pt's wife and daughter; they are leaning toward comfort measures but would like to speak with palliative care team about next steps - I have advised that the wife, daughter and son can be allowed to have visitation  rights since we are likely transitioning to comfort care - they have requested that medical information be shared only with the wife, daughter and son   Best Practice (right click and "Reselect all SmartList Selections" daily)   Diet/type: NPO w/ meds via tube DVT prophylaxis: SCD, SQH GI prophylaxis: PPI Code Status:  No CPR, no defibrilation  Labs:   CMP Latest Ref Rng & Units 02/10/2021 02/09/2021 02/08/2021  Glucose 70 - 99 mg/dL 093(A) 355(D) 322(G)  BUN 8 - 23 mg/dL 25(K) 27(C) 62(B)  Creatinine 0.61 - 1.24 mg/dL 7.62 8.31 5.17(O)  Sodium 135 - 145 mmol/L 147(H) 142 138  Potassium 3.5 - 5.1 mmol/L 4.4 4.1 4.3  Chloride 98 - 111 mmol/L 111 110 106  CO2 22 - 32 mmol/L 26 24 18(L)  Calcium 8.9 - 10.3 mg/dL 9.1 1.6(W) 7.3(X)  Total Protein 6.5 - 8.1 g/dL - - -  Total Bilirubin 0.3 - 1.2 mg/dL - - -  Alkaline Phos 38 - 126 U/L - - -  AST 15 - 41 U/L - - -  ALT 0 - 44 U/L - - -    CBC Latest Ref Rng & Units 02/09/2021 02/08/2021 02/07/2021  WBC 4.0 - 10.5 K/uL 5.3 7.6 8.3  Hemoglobin 13.0 - 17.0 g/dL 10.6 26.9 17.5(H)  Hematocrit 39.0 - 52.0 % 45.9 49.1 51.5  Platelets 150 - 400 K/uL 176 173 187    ABG    Component Value Date/Time   PHART 7.450 February 21, 2021 1837   PCO2ART 34.6 02/21/21 1837   PO2ART 331 (H) 02/21/21 1837   HCO3 24.2 2021/02/21 1837   TCO2 25 2021/02/21 1837   ACIDBASEDEF 10.0 (H) 07/28/2018 2052   O2SAT 58.8 Feb 21, 2021 2335    CBG (last 3)  Recent Labs    02/09/21 2329 02/10/21 0336 02/10/21 0744  GLUCAP 229* 210* 251*    Critical care time: 38 minutes  Coralyn Helling, MD [facility] Pager - [phone] 02/10/2021, 10:02 AM

## 2021-02-11 DIAGNOSIS — Z515 Encounter for palliative care: Secondary | ICD-10-CM

## 2021-02-11 DIAGNOSIS — J988 Other specified respiratory disorders: Secondary | ICD-10-CM | POA: Diagnosis not present

## 2021-02-11 DIAGNOSIS — Z7189 Other specified counseling: Secondary | ICD-10-CM

## 2021-02-11 DIAGNOSIS — S0990XA Unspecified injury of head, initial encounter: Secondary | ICD-10-CM

## 2021-02-11 LAB — GLUCOSE, CAPILLARY
Glucose-Capillary: 248 mg/dL — ABNORMAL HIGH (ref 70–99)
Glucose-Capillary: 261 mg/dL — ABNORMAL HIGH (ref 70–99)
Glucose-Capillary: 306 mg/dL — ABNORMAL HIGH (ref 70–99)
Glucose-Capillary: 279 mg/dL — ABNORMAL HIGH (ref 70–99)
Glucose-Capillary: 257 mg/dL — ABNORMAL HIGH (ref 70–99)
Glucose-Capillary: 321 mg/dL — ABNORMAL HIGH (ref 70–99)

## 2021-02-11 MED ORDER — SODIUM CHLORIDE 0.9 % IV SOLN: INTRAVENOUS | Status: DC | PRN

## 2021-02-11 NOTE — Consult Note (Addendum)
Palliative Care Consult Note                                  Date: 02/11/2021   Patient Name: [name] Frank.  DOB: 1950-05-01  MRN: [id number]  Age / Sex: 70 y.o., male  PCP: Copland, Gay Filler, MD Referring Physician: Chesley Mires, MD  Reason for Consultation: Establishing goals of care  HPI/Patient Profile: 70 y.o. male  with past medical history of SDH/SAH, status epilepticus disorder, hydrocephalus s/p VP shunt (9/20), HTN, HLD, afib, DM2. CAD post DES (01/16) x1 to LAD admitted on 01/31/2021 with fall and unresponsiveness. His GCS was 7 in ED and he required intubation.  Initial neuroimaging and EEG were unrevealing.  He had MRI brain on 02/07/21 that showed acute infarct in the midbrain/cerebral peduncles with extension along the 3rd ventricle as well as small area of infarct in Lt occipital lobe.unresponsive.  EEG noted moderate to severe diffuse encephalopathy. Remains on the vent, weaning in progress. Found acute infarct in the midbrain and cerebral peduncles consistent with artery of Percheron infarct, small left occipital lobe infarct and stable VP shunt. Overall neuro feels he has a poor prognosis.  PMT was consulted for [place].  Past Medical History:  Diagnosis Date   Abnormality of gait    uses walker and wheelchair   Cognitive deficit as late effect of traumatic brain injury [facility])    Coronary artery disease    cardiologist-- dr s. Jake Bathe---  04-06-2014  NSTEMI  s/p  cardiac cath DES x1 to LAD and POBA to D1   Dysrhythmia    History of non-ST elevation myocardial infarction (NSTEMI)    04-06-2013  s/p  coronary stent and PCI   Hydrocele, bilateral    Hyperlipemia    Hypertension    Lower urinary tract symptoms (LUTS)    Memory deficit    PAF (paroxysmal atrial fibrillation) ([place]) cardilogist-- dr Jake Bathe   on-set 05/ 2020 during admission for [facility],  w/ RVR,  with cardizem/ toprol, back in NSR,  f/u by cardiologist ,  event monitor 09-16-2018 no results in epic by per pt wife was told result ok with no afib   S/P drug eluting coronary stent placement    04-06-2014  @[facility]   DES x1 to LAD and POBA to D1   SAH (subarachnoid hemorrhage) [facility]) neurologist-  dr Krista Blue--- last head CT 10-01-2018 resolving (10-23-2018  residual deficits memory issues, abnormal gait, congnitive   w/ SDH due to unwitnessed fall, traumatic head injury---- admission 07-14-2018 , d/c'd 08-01-2018,  readmitted 3 days later with late TBI effects,  d/c'd from rehab 08-22-2018   Seizure disorder [facility])    followed by dr Krista Blue   Subdural hematoma    Type 2 diabetes mellitus (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  I met with the patient at the bedside who was intubated and unable to respond. The patient's daughter and son were bedside and I was able to speak with them. I also spoke with his wife on the phone.  Later in the day I was able to have a full family meeting with the patient's wife, son, daughter.  Concept of Palliative Care was introduced as specialized medical care for people and their families living with serious illness.  If focuses on providing relief from the symptoms and stress of a serious illness.  The goal is to improve quality of life for both the patient and the family. Values and goals of care important to patient and family were attempted to be elicited.  Created space and opportunity for patient  and family to explore thoughts and feelings regarding current medical situation   Natural trajectory and current clinical status were discussed. Questions and concerns addressed. Patient  encouraged to call with questions or concerns.    Patient/Family Understanding of  Illness: They understand he has had a CVA.  However, they state that they were not aware of this for a couple days.  He was found on the floor and there is a question of whether he fell versus was sleeping.  They note he has a lot of medical issues and so they were not shocked and expected that his VP shunt was may be malfunctioning.  They called EMS and he was brought to the hospital and continued on for the next couple days with no update.  Finally 2 days after going to the hospital a physician called him and explained that he has had a CVA and it is bad.  They understand he has had a large stroke, is unresponsive, and has brainstem damage.  They know he does not have several reflexes such as no gag reflex and no pupil response.  He is not following verbal commands currently.  They did have several technical questions that we covered.  I attempted to answer all the questions the best my ability.  When I spoke with the nurse she stated that on Friday he was following commands, less so on Friday night, and as of today he has not followed any commands or had much response despite no sedation.  They feel overall he is having a deterioration or, at very least, no improvement.  Life Review: His family spoke very fondly of him.  They stated he is loyal to his family, school, company, country, sports teams.  He is very honest and, above all, has a very high level of integrity.  He is known to be very trustworthy.  He is levelheaded and considers all pros/cons when making decisions.  He has tactful, caregiver, and sought as his role in duty to be a provider for his family.  He always instilled in his children to "be proud of who you are and where you come from."  He spent his career working in the [employer] and found that the [employer].  He went to [employer].  He left sports and cheered for [place].  Patient Values: Family, integrity, independence, loyalty  Goals: While they were hopeful for recovery, they feel that it is clear he is not going to have a good neurological recovery.  They want to prevent undue suffering.  They indicate that they do not want trach, PEG, long-term facility living.  This is informed by his values of independence and family, and that he would not want his family to have to face this undue burden.  They are leaning toward comfort care at this time.  Today's Discussion: I spoke with the patient's wife on the phone.  She was expecting our call in order to schedule a family  meeting.  We tentatively scheduled the meeting for 10:00 in the morning.  I went to the unit, I was informed that the patient's son and daughter at the bedside.  They explained the complicated family dynamics stated that the patient's brother and sister are very pro life and would like all treatments and full aggressive care to be provided.  However, the patient's daughter indicated that they are realistic and the chances of the patient's recovery and have been discussing and leaning toward comfort care.  At some point, the patient's brother and sister (were allowed to visit in person better not allowed to get information about prognosis or make decisions) were informed of the meeting at 10:00 and checked into the volunteer desk to attend.  The decision was made to postpone the meeting until 3:00 in the afternoon because the brother and sister were planning to go back to [place] after they visited.  This would allow family meeting to occur the patient's wife, son, daughter who are all participating in decision-making.  However, I later received a message from the nurse that the patient is having a severe anxiety attack regarding all of the family tension and dynamics.  The nurse now questions the patient's ability to make rational decisions today.  The patient's son  and daughter have requested that we postpone the meeting to another day.  A significant amount of time was spent back-and-forth coordination of care meeting, chart review, counseling patient's family, coordinating care, and working through complex family dynamics.  Later in the day I had a full family meeting with the patient's wife, son, daughter without presents of the patient's brother and sister who have now returned back to [place] after a lot of family friction today.  We further discussed his current clinical situation.  They had a lot of questions such as what is a CVA and we discussed a lot of physiological differences between embolic CVA versus hemorrhagic CVA, what makes a stroke a "large stroke", the specifics of his strokes, functional areas of the brain and what can be expected with certain types of strokes with specific emphasis on brainstem infarct related to reflexes and basic life functioning.  I provided emotional and general support through therapeutic listening, empathy, reminiscing, sharing of stories, and other techniques.  I answered all questions and addressed all concerns to the best of my ability.  His wife seems to be taking this particularly hard and seems to be more fragile.  Her children are very supportive and appear to be making the best attempt to get her through this situation.  Objective:   Primary Diagnoses: Present on Admission:  Altered mental status  Vital Signs:  BP 140/78   Pulse 79   Temp 99.9 F (37.7 C) (Esophageal)   Resp (!) 27   Ht 5' 9"  (1.753 m)   Wt 90 kg   SpO2 93%   BMI 29.30 kg/m   Palliative Assessment/Data: 10%    Advanced Care Planning:   Primary Decision Maker: NEXT OF KIN  Code Status/Advance Care Planning: Limited code  Decisions/Changes to ACP: None today (remain partial code as he is currently intubated.  They would like a code called and provision of medications, defibrillation, but no CPR. They agree and  Lonia Blood that they will shift to comfort focus.  First they would like a chaplain from the [employer] to come perform last rights.  After that they would like to initiate comfort measures "is not as possible".  We are unsure when  a priest will be able to be here (tonight?  Tomorrow?)  and we will follow for timing of comfort measure initiation  Assessment & Plan:   Impression: 70 year old male with multiple chronic conditions as described above now admitted with an acute infarct in midbrain/cerebral peduncles with extension into the third ventricle and small area of infarct in the left occipital lobe.  He is been noted to be intermittently unresponsive.  Findings consistent with Percheron infarct.  Overall neuro prognosis is poor.  Attempts were made at goals of care family meeting, however this had to be again canceled today due to complicated family dynamics. We are finally able to complete a family meeting.  In summary, they are reasonable with his expectation of poor neurological outcome and high risk of death.  They have agreed to shift to comfort care once he can receive last rights from the priest.  We will continue to follow and make the transition when they are ready and after he has been seen by his priest  SUMMARY [place] the family time to have a priest to give last rights Agreeable to comfort care transition after last rights, timing to be determined Only individuals allowed to visit the patient are his wife, son, daughter Anyone else who would like to visit staff must clear with wife, son, or daughter first We will continue to follow and initiate compassionate extubation and comfort care when family is ready PMT will continue to follow  Symptom Management:  Per primary team PMT is available to assist as needed  Prognosis:  Unable to determine  Discharge Planning:  To Be Determined   Discussed with: Medical team, nursing team, patient's family  Thank  you for allowing [place] to participate in the care of Osiel Stick. PMT will continue to support holistically.  Time Total: 60 min (initial work) + 90 min (family meeting) = 150 min  Greater than 50%  of this time was spent counseling and coordinating care related to the above assessment and plan.  Signed by: Walden Field, NP Palliative Medicine Team  Team Phone # [phone] (Nights/Weekends)  02/11/2021, 9:28 AM

## 2021-02-11 NOTE — Progress Notes (Signed)
   02/11/21 0814  Airway 7.5 mm  Placement Date/Time: 01/17/2021 1730   Placed By: ED Physician  Airway Device: Endotracheal Tube  ETT Types: Oral  Size (mm): 7.5 mm  Insertion attempts: 1  Placement Confirmation: ETCO2 (Capnography);Direct Visualization;Bilateral Breath Sounds;Chest R...  Secured at (cm) 26 cm  Measured From Lips  Secured Location Right  Secured By Actuary Repositioned Yes  Prone position No  Cuff Pressure (cm H2O) Clear OR 27-39 CmH2O  Site Condition Dry  Adult Ventilator Settings  Vent Type Servo i  Humidity HME  Vent Mode CPAP;PSV  FiO2 (%) 40 %  Pressure Support 10 cmH20  PEEP 5 cmH20  Adult Ventilator Measurements  Peak Airway Pressure 15 L/min  Mean Airway Pressure 8 cmH20  Resp Rate Spontaneous 25 br/min  Resp Rate Total 25 br/min  Spont TV 301 mL  Measured Ve 8.8 mL  Auto PEEP 0 cmH20  Total PEEP 5 cmH20  SpO2 94 %  Adult Ventilator Alarms  Alarms On Y  Ve High Alarm 18 L/min  Ve Low Alarm 4 L/min  Resp Rate High Alarm 38 br/min  Resp Rate Low Alarm 8  PEEP Low Alarm 3 cmH2O  Press High Alarm 40 cmH2O  T Apnea 20 sec(s)  Daily Weaning Assessment  Daily Assessment of Readiness to Wean Wean protocol criteria met (SBT performed)  SBT Method  (PS 10 D/T low TV)  Weaning Start Time [phone]  Breath Sounds  Bilateral Breath Sounds Diminished  Airway Suctioning/Secretions  Suction Type ETT  Suction Device  Catheter  Secretion Amount Moderate  Secretion Color White  Secretion Consistency Thick  Suction Tolerance Tolerated well  Suctioning Adverse Effects None

## 2021-02-11 NOTE — Progress Notes (Signed)
NAME:  [name] Frank, [name] Frank MRN:  [id number], DOB:  March 02, 1951, LOS: 6 ADMISSION DATE:  02/02/2021, CONSULTATION DATE:  11/27 REFERRING MD:  Almyra Free, CHIEF COMPLAINT:  Fall and AMS   History of Present Illness:  70 yo male brought to ER after falling and unresponsive.  GCS 7 in ED and he required intubation.  Initial neuroimaging and EEG were unrevealing.  He had MRI brain on 02/07/21 that showed acute infarct in the midbrain/cerebral peduncles with extension along the 3rd ventricle as well as small area of infarct in Lt occipital lobe.  Pertinent Medical History:  SDH/SAH, status epilepticus disorder, hydrocephalus s/p VP shunt (9/20), HTN, HLD, afib, DM2. CAD post DES (01/16) x1 to LAD  Significant Hospital Events: Including procedures, antibiotic start and stop dates in addition to other pertinent events   11/27 presented to [facility] as level 1 trauma. CT Head NAICA. CTA Head/Neck with new focal stenosis M2 branch, focal narrowing of distal L PCA, unchanged L vertebral artery occlusion (V1/V2) with irregular flow in distal V3/prox V4 (retrograde flow), redemonstrated basilar irregularity, no basilar thrombosis. 11/28 LTM EEG showing moderate to severe diffuse encephalopathy (nonspecific). No seizures. Echo with mildly decreased LVEF from prior, mild LVH, distal anteroseptal and apical hypokinesis (though evaluation limited by exam). MRI pending. EEG discontinued. 11/29 Remains low-grade febrile to Tmax 100.9. Decreased spontaneous LUE movement and decreased pupil size/reactivity, otherwise unchanged neuro exam. MRI Brain demonstrated acute infarct in the midbrain/cerebral peduncles (L > R) with extension along the third ventricle as well as a small area of infarct in the L occipital lobe. NSG consult for CSF valve evaluation post-scan. 11/30 Stroke team engaged. Weaning on vent, PSV 10/5. Neurologic exam grossly unchanged. 12/02 palliative care consulted 12/03 family meeting planned  Interim History /  Subjective:  Fever overnight and cooling blanket applied.  Objective:  Blood pressure 134/77, pulse 76, temperature (!) 100.9 F (38.3 C), temperature source Esophageal, resp. rate 18, height 5\' 9"  (1.753 m), weight 90 kg, SpO2 93 %.    Vent Mode: PRVC FiO2 (%):  [40 %] 40 % Set Rate:  [18 bmp] 18 bmp Vt Set:  [560 mL] 560 mL PEEP:  [5 cmH20] 5 cmH20 Pressure Support:  [10 cmH20] 10 cmH20 Plateau Pressure:  [18 cmH20-20 cmH20] 18 cmH20   Intake/Output Summary (Last 24 hours) at 02/11/2021 0810 Last data filed at 02/11/2021 0600 Gross per 24 hour  Intake 1370 ml  Output 2125 ml  Net -755 ml   Filed Weights   02/06/21 0500 02/07/21 0500 02/10/21 0500  Weight: 90.4 kg 90.1 kg 90 kg   Physical Examination:  General - on vent Eyes - pupils mid point and non reactive ENT - ETT in place Cardiac - regular rate/rhythm, no murmur Chest - equal breath sounds b/l, no wheezing or rales Abdomen - soft, non tender, + bowel sounds Extremities - no cyanosis, clubbing, or edema Skin - no rashes Neuro - no response to stimuli   Resolved Hospital Problem List:   Lactic acidosis  Assessment & Plan:   Acute infarct in midbrain and cerebral pedunclues, consitent with artery of Percheron infarct, small left occipital lobe infarct and stable VP shunt. Hx of SAH/SDH, seizures. - poor neuro prognosis  Compromised airway. - continue vent support until family makes decisions about goals of care  DM type 2 poorly controlled with hyperglycemia. - SSI  Urine retention. - continue foley since he will likely be transitioning to comfort care  Hx of A fib, CAD  s/p DES, HTN, HLD. - continue ASA, crestor  Goals of care. - family meeting planned with palliative care on 12/03 - I have advised that the wife, daughter and son can be allowed to have visitation rights since we are likely transitioning to comfort care - they have requested that medical information be shared only with the wife,  daughter and son   Best Practice (right click and "Reselect all SmartList Selections" daily)   Diet/type: NPO w/ meds via tube DVT prophylaxis: SCD, SQH GI prophylaxis: PPI Code Status:  No CPR, no defibrilation  Labs:   CMP Latest Ref Rng & Units 02/10/2021 02/09/2021 02/08/2021  Glucose 70 - 99 mg/dL 010(U) 725(D) 664(Q)  BUN 8 - 23 mg/dL 03(K) 74(Q) 59(D)  Creatinine 0.61 - 1.24 mg/dL 6.38 7.56 4.33(I)  Sodium 135 - 145 mmol/L 147(H) 142 138  Potassium 3.5 - 5.1 mmol/L 4.4 4.1 4.3  Chloride 98 - 111 mmol/L 111 110 106  CO2 22 - 32 mmol/L 26 24 18(L)  Calcium 8.9 - 10.3 mg/dL 9.1 9.5(J) 8.8(C)  Total Protein 6.5 - 8.1 g/dL - - -  Total Bilirubin 0.3 - 1.2 mg/dL - - -  Alkaline Phos 38 - 126 U/L - - -  AST 15 - 41 U/L - - -  ALT 0 - 44 U/L - - -    CBC Latest Ref Rng & Units 02/09/2021 02/08/2021 02/07/2021  WBC 4.0 - 10.5 K/uL 5.3 7.6 8.3  Hemoglobin 13.0 - 17.0 g/dL 16.6 06.3 17.5(H)  Hematocrit 39.0 - 52.0 % 45.9 49.1 51.5  Platelets 150 - 400 K/uL 176 173 187    ABG    Component Value Date/Time   PHART 7.450 01/10/2021 1837   PCO2ART 34.6 02/06/2021 1837   PO2ART 331 (H) 01/10/2021 1837   HCO3 24.2 02/06/2021 1837   TCO2 25 02/03/2021 1837   ACIDBASEDEF 10.0 (H) 07/28/2018 2052   O2SAT 58.8 01/17/2021 2335    CBG (last 3)  Recent Labs    02/10/21 2324 02/11/21 0356 02/11/21 0750  GLUCAP 309* 248* 261*    Signature:  Coralyn Helling, MD [facility] Pulmonary/Critical Care Pager - [phone] 02/11/2021, 8:10 AM

## 2021-02-11 NOTE — Progress Notes (Addendum)
Patient's wife Apolo Cutshaw and daughter Phuoc Huy expressed concern about patient's brother Holston Oyama) and sister in law Rodriques Badie) getting information on patient when they come to visit him today. Wife specifically said "I don't even want them knowing a blood sugar on the patient". I clarified with her that by virtue of allowing patient's brother and sister in law to visit they will inevitably know information about patient, ex: vitals, since they will be at the bedside. I asked wife and daughter if since this is such a sensitive issue if they would prefer to not allow the brother and sister-in-law to visit at all to avoid all potential problems. Wife said she did not want to cause problems within their family and asked if the nursing staff could watch the brother and sister-in-law when they visit to make sure they don't get much information. I educated the wife and daughter that that responsibility cannot fall on me as the primary RN or any of the other nursing staff  because we have other critical patient's in the ICU that we need to take care of therefore I cannot promise that I will be at bedside the entire time the other family is visiting. Wife and daughter verbalized understanding of this. I then offered for the wife and daughter to stay in the room as extra visitors while the brother and sister-in-law came to visit to ensure no problems arise. They declined that as well. I then later saw the wife and daughter leave the room and then the brother and sister-in-law come up alone not long after.  Edit: Brother and sister-in-law stayed for a few hours. I answered their questions related to what they saw in the room. Ex: "Is he following commands?" "Is he still on the breathing machine?" Etc. I did not discuss with them plan of care or anything else the wife requested me to not say to them.

## 2021-02-11 NOTE — Consult Note (Incomplete Revision)
Palliative Care Consult Note                                  Date: 02/11/2021   Patient Name: [name] Frank.  DOB: 04-10-1950  MRN: [id number]  Age / Sex: 70 y.o., male  PCP: Copland, Gay Filler, MD Referring Physician: Chesley Mires, MD  Reason for Consultation: Establishing goals of care  HPI/Patient Profile: 70 y.o. male  with past medical history of SDH/SAH, status epilepticus disorder, hydrocephalus s/p VP shunt (9/20), HTN, HLD, afib, DM2. CAD post DES (01/16) x1 to LAD admitted on 01/15/2021 with fall and unresponsiveness. His GCS was 7 in ED and he required intubation.  Initial neuroimaging and EEG were unrevealing.  He had MRI brain on 02/07/21 that showed acute infarct in the midbrain/cerebral peduncles with extension along the 3rd ventricle as well as small area of infarct in Lt occipital lobe.unresponsive.  EEG noted moderate to severe diffuse encephalopathy. Remains on the vent, weaning in progress. Found acute infarct in the midbrain and cerebral peduncles consistent with artery of Percheron infarct, small left occipital lobe infarct and stable VP shunt. Overall neuro feels he has a poor prognosis.  PMT was consulted for [place].  Past Medical History:  Diagnosis Date   Abnormality of gait    uses walker and wheelchair   Cognitive deficit as late effect of traumatic brain injury [facility])    Coronary artery disease    cardiologist-- dr s. Jake Bathe---  04-06-2014  NSTEMI  s/p  cardiac cath DES x1 to LAD and POBA to D1   Dysrhythmia    History of non-ST elevation myocardial infarction (NSTEMI)    04-06-2013  s/p  coronary stent and PCI   Hydrocele, bilateral    Hyperlipemia    Hypertension    Lower urinary tract symptoms (LUTS)    Memory deficit    PAF (paroxysmal atrial fibrillation) ([place]) cardilogist-- dr Jake Bathe   on-set 05/ 2020 during admission for [facility],  w/ RVR,  with cardizem/ toprol, back in NSR,  f/u by cardiologist ,  event monitor 09-16-2018 no results in epic by per pt wife was told result ok with no afib   S/P drug eluting coronary stent placement    04-06-2014  $RemoveBef'@[email or web address]$   DES x1 to LAD and POBA to D1   SAH (subarachnoid hemorrhage) [facility]) neurologist-  dr Krista Blue--- last head CT 10-01-2018 resolving (10-23-2018  residual deficits memory issues, abnormal gait, congnitive   w/ SDH due to unwitnessed fall, traumatic head injury---- admission 07-14-2018 , d/c'd 08-01-2018,  readmitted 3 days later with late TBI effects,  d/c'd from rehab 08-22-2018   Seizure disorder [facility])    followed by dr Krista Blue   Subdural hematoma    Type 2 diabetes mellitus ([place])    followed by pcp   Urinary incontinence    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  I met with the patient at the bedside who was intubated and unable to respond. The patient's daughter and son were bedside and I was able to speak with them. I also spoke with his wife on the phone.   Concept of Palliative Care was introduced as specialized medical care for people and their families living with serious  illness.  If focuses on providing relief from the symptoms and stress of a serious illness.  The goal is to improve quality of life for both the patient and the family. Values and goals of care important to patient and family were attempted to be elicited.  Created space and opportunity for patient  and family to explore thoughts and feelings regarding current medical situation   Natural trajectory and current clinical status were discussed. Questions and concerns addressed. Patient  encouraged to call with questions or concerns.    Patient/Family Understanding of Illness: Deferred  Life Review: Deferred  Patient Values: Deferred  Goals: Deferred  Today's  Discussion: I spoke with the patient's wife on the phone.  She was expecting our call in order to schedule a family meeting.  We tentatively scheduled the meeting for 10:00 in the morning.  I went to the unit, I was informed that the patient's son and daughter at the bedside.  They explained the complicated family dynamics stated that the patient's brother and sister are very pro life and would like all treatments and full aggressive care to be provided.  However, the patient's daughter indicated that they are realistic and the chances of the patient's recovery and have been discussing and leaning toward comfort care.  At some point, the patient's brother and sister (were allowed to visit in person better not allowed to get information about prognosis or make decisions) were informed of the meeting at 10:00 and checked into the volunteer desk to attend.  The decision was made to postpone the meeting until 3:00 in the afternoon because the brother and sister were planning to go back to [place] after they visited.  This would allow family meeting to occur the patient's wife, son, daughter who are all participating in decision-making.  However, I later received a message from the nurse that the patient is having a severe anxiety attack regarding all of the family tension and dynamics.  The nurse now questions the patient's ability to make rational decisions today.  The patient's son and daughter have requested that we postpone the meeting to another day.  A significant amount of time was spent back-and-forth coordination of care meeting, chart review, counseling patient's family, coordinating care, and working through complex family dynamics.  Objective:   Primary Diagnoses: Present on Admission:  Altered mental status  Vital Signs:  BP 140/78    Pulse 79    Temp 99.9 F (37.7 C) (Esophageal)    Resp (!) 27    Ht _0  (1.753 m)    Wt 90 kg    SpO2 93%    BMI 29.30 kg/m   Palliative  Assessment/Data: TBD    Advanced Care Planning:   Primary Decision Maker: NEXT OF KIN  Code Status/Advance Care Planning: Limited code  Decisions/Changes to ACP: None today  Assessment & Plan:   Impression: 70 year old male with multiple chronic conditions as described above now admitted with an acute infarct in midbrain/cerebral peduncles with extension into the third ventricle and small area of infarct in the left occipital lobe.  He is been noted to be intermittently unresponsive.  Findings consistent with Percheron infarct.  Overall neuro prognosis is poor.  Attempts were made at goals of care family meeting, however this had to be again canceled today due to complicated family dynamics.  We will attempt to meet again with the patient's wife, son, daughter tomorrow.  SUMMARY OF RECOMMENDATIONS   Contact family tomorrow morning and attempt to schedule  a family meeting PMT will continue to follow  Symptom Management:  Per primary team PMT is available to assist as needed  Prognosis:  Unable to determine  Discharge Planning:  To Be Determined   Discussed with: Medical team, nursing team, patient's family  Thank you for allowing [place] to participate in the care of [name] Frank. PMT will continue to support holistically.  Time Total: 60 min  Greater than 50%  of this time was spent counseling and coordinating care related to the above assessment and plan.  Signed by: Walden Field, NP Palliative Medicine Team  Team Phone # [phone] (Nights/Weekends)  02/11/2021, 9:28 AM

## 2021-02-11 NOTE — Progress Notes (Addendum)
STROKE TEAM PROGRESS NOTE   INTERVAL HISTORY Patient has been hemodynamically stable overnight with no changes in his neurological exam. He is on pressure support 10/5 and 40%. Patient did not follow commands on neuro exam. Palliative care on board to discuss goals of care.   Vitals:   02/11/21 0500 02/11/21 0800 02/11/21 0814 02/11/21 0835  BP: 134/77   138/75  Pulse: 76 69  80  Resp: 18 18  (!) 28  Temp:  99.9 F (37.7 C)    TempSrc:  Esophageal    SpO2: 93% 93% 94% 93%  Weight:      Height:       CBC:  Recent Labs  Lab 02/07/21 0340 02/08/21 0414 02/09/21 0502  WBC 8.3 7.6 5.3  NEUTROABS 6.5 6.5  --   HGB 17.5* 16.2 14.9  HCT 51.5 49.1 45.9  MCV 87.3 89.3 89.0  PLT 187 173 176    Basic Metabolic Panel:  Recent Labs  Lab 02/09/21 0502 02/09/21 1701 02/10/21 0515  NA 142  --  147*  K 4.1  --  4.4  CL 110  --  111  CO2 24  --  26  GLUCOSE 292*  --  278*  BUN 38*  --  43*  CREATININE 0.92  --  0.91  CALCIUM 8.8*  --  9.1  MG 2.7* 2.8* 2.7*  PHOS 2.1* 3.2  --     Lipid Panel:  Recent Labs  Lab 02/09/21 0502  CHOL 168  TRIG 143  HDL 33*  CHOLHDL 5.1  VLDL 29  LDLCALC 387*    HgbA1c:  Recent Labs  Lab 2021/02/18 2128  HGBA1C 9.0*    Urine Drug Screen:  Recent Labs  Lab 2021/02/18 2016  LABOPIA NONE DETECTED  COCAINSCRNUR NONE DETECTED  LABBENZ NONE DETECTED  AMPHETMU NONE DETECTED  THCU NONE DETECTED  LABBARB NONE DETECTED     Alcohol Level  Recent Labs  Lab 02/18/21 2317  ETH <10     IMAGING past 24 hours No results found.  PHYSICAL EXAM General:  Intubated, well-developed patient in no acute distress  Neurological:  Patient did not follow any commands.  Pupils nonreactive, minimal corneal reflex present, no oculocephalic reflex. Cough and gag reflexes present.  He does not blink to threat. No movement on right side to noxious stimulus, will intermittently have nonpurposeful movement on left. Hyperreflexive in the left lower  extremity  ASSESSMENT/PLAN [name]. is a 70 y.o. 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  His prognosis is poor, and he would likely require a tracheostomy and PEG tube if aggressive care measures were continued.  Lengthy discussion of patient's prognosis and the likely course of his illness was had with patient's wife.  Patient's children are traveling in from out of town, and the family intends to make a decision on goals of care when they arrive.  Palliative care consult placed.  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 VP shunt in place CTA head & neck Focal stenosis in right M2, stenosis in left P2, left vertebral artery occlusion in V1 and V2 segments, no basilar thrombus MRI  Acute infarct in midbrain and cerebral pedunclues, consitent with  artery of Percheron infarct, small left occipital lobe infarct and stable VP shunt 2D Echo EF 50-55%, mild LVH, no atrial level shunt LDL 148 HgbA1c 9.0 VTE prophylaxis - SCDs aspirin 81 mg daily prior to admission, now on aspirin 325mg   Therapy recommendations:  pending Disposition:  pending  Hypertension Home meds:  none Stable SBP goal <180 Long-term BP goal normotensive  Hyperlipidemia Home meds:  rosuvastatin 5 mg daily, resumed in hospital LDL 148, goal < 70 High intensity statin to be added depending on goals of care decision Continue statin at discharge  Diabetes type II Uncontrolled Home meds:  Invokana 100 mg daily HgbA1c 9.0, goal < 7.0 CBGs Hyperglycemia SSI Novolog 6 units q6h Diabetes management consult depending on goals of care discussion  Paroxysmal Atrial  Fibrillation Patient was not taking anticoagulation at home NSR seen on monitor Continue cardiac monitoring, may consider anticoagulation in the future if aggressive care  History of traumatic SDH/SAH with hydrocephalus S/p VPS VP shunt functioning normally Followed by Dr. Zada Finders  Respiratory failure Patient intubated due to inability to protect airway On weaning now but did not tolerate 12/1 but seems tolerating 12/2 Ventilator management per primary team  Other Stroke Risk Factors Advanced Age >/= 40  Hx stroke/TIA  Other Active Problems VP shunt in place Shunt operating normally  Hospital day # [place] , [place], FNP-BC [facility] See Amion for schedule and pager information 02/11/2021 8:54 AM  ATTENDING ATTESTATION:  Pallative care discussing with family today. Exam is unchanged.   Dr. Reeves Forth evaluated pt independently, reviewed imaging, chart, labs. Discussed and formulated plan with the APP. Please see APP note above for details.      This patient is critically ill due to respiratory distress, stroke  and at significant risk of neurological worsening, death form heart failure, respiratory failure, recurrent stroke, bleeding from [facility], seizure, sepsis. This patient's care requires constant monitoring of vital signs, hemodynamics, respiratory and cardiac monitoring, review of multiple databases, neurological assessment, discussion with family, other specialists and medical decision making of high complexity. I spent 35 minutes of neurocritical care time in the care of this patient.   Kiandre Spagnolo,MD    To contact Stroke Continuity provider, please refer to [email or web address] After hours, contact General Neurology

## 2021-02-11 NOTE — Progress Notes (Signed)
   Brief Palliative Medicine Progress Note:  PMT consult received and chart reviewed. Goals of care completed, full note to follow.  Recommendations: Allow the family time to have a priest to give last rights Agreeable to comfort care transition after last rights, timing to be determined Only individuals allowed to visit the patient are his wife, son, daughter Anyone else who would like to visit staff must clear with wife, son, or daughter first We will continue to follow and initiate compassionate extubation and comfort care when family is ready   Thank you for allowing [place] to participate in the care of [name] Frank.  Signed by: Wynne Dust, NP Palliative Medicine Team [facility] CHARGE  Team Phone # [phone] (Nights/Weekends)  02/11/2021, 5:06 PM

## 2021-02-12 DIAGNOSIS — I6312 Cerebral infarction due to embolism of basilar artery: Secondary | ICD-10-CM | POA: Diagnosis not present

## 2021-02-12 LAB — GLUCOSE, CAPILLARY
Glucose-Capillary: 243 mg/dL — ABNORMAL HIGH (ref 70–99)
Glucose-Capillary: 270 mg/dL — ABNORMAL HIGH (ref 70–99)
Glucose-Capillary: 305 mg/dL — ABNORMAL HIGH (ref 70–99)
Glucose-Capillary: 262 mg/dL — ABNORMAL HIGH (ref 70–99)
Glucose-Capillary: 202 mg/dL — ABNORMAL HIGH (ref 70–99)

## 2021-02-12 MED ORDER — GLYCOPYRROLATE 0.2 MG/ML IJ SOLN
0.2000 mg | INTRAMUSCULAR | Status: DC
  Administered 2021-02-12 – 2021-02-13 (×6): 0.2 mg via INTRAVENOUS
  Filled 2021-02-12 (×6): qty 1

## 2021-02-12 MED ORDER — HALOPERIDOL 0.5 MG PO TABS: 0.5000 mg | ORAL_TABLET | ORAL | Status: DC | PRN

## 2021-02-12 MED ORDER — MORPHINE BOLUS VIA INFUSION
2.0000 mg | INTRAVENOUS | Status: DC | PRN
  Administered 2021-02-12 – 2021-02-13 (×14): 2 mg via INTRAVENOUS
  Filled 2021-02-12: qty 2

## 2021-02-12 MED ORDER — HALOPERIDOL LACTATE 5 MG/ML IJ SOLN: 0.5000 mg | INTRAMUSCULAR | Status: DC | PRN

## 2021-02-12 MED ORDER — POLYVINYL ALCOHOL 1.4 % OP SOLN: 1.0000 [drp] | Freq: Four times a day (QID) | OPHTHALMIC | Status: DC | PRN

## 2021-02-12 MED ORDER — ONDANSETRON 4 MG PO TBDP: 4.0000 mg | ORAL_TABLET | Freq: Four times a day (QID) | ORAL | Status: DC | PRN

## 2021-02-12 MED ORDER — ACETAMINOPHEN 650 MG RE SUPP: 650.0000 mg | Freq: Four times a day (QID) | RECTAL | Status: DC | PRN

## 2021-02-12 MED ORDER — LORAZEPAM 2 MG/ML IJ SOLN
1.0000 mg | INTRAMUSCULAR | Status: DC | PRN
  Filled 2021-02-12: qty 1

## 2021-02-12 MED ORDER — BIOTENE DRY MOUTH MT LIQD: 15.0000 mL | OROMUCOSAL | Status: DC | PRN

## 2021-02-12 MED ORDER — LORAZEPAM 2 MG/ML PO CONC: 1.0000 mg | ORAL | Status: DC | PRN

## 2021-02-12 MED ORDER — ONDANSETRON HCL 4 MG/2ML IJ SOLN: 4.0000 mg | Freq: Four times a day (QID) | INTRAMUSCULAR | Status: DC | PRN

## 2021-02-12 MED ORDER — LORAZEPAM 1 MG PO TABS: 1.0000 mg | ORAL_TABLET | ORAL | Status: DC | PRN

## 2021-02-12 MED ORDER — MORPHINE 100MG IN NS 100ML (1MG/ML) PREMIX INFUSION
2.0000 mg/h | INTRAVENOUS | Status: DC
  Administered 2021-02-12: 2 mg/h via INTRAVENOUS
  Administered 2021-02-13: 10 mg/h via INTRAVENOUS
  Administered 2021-02-13: 9 mg/h via INTRAVENOUS
  Filled 2021-02-12 (×3): qty 100

## 2021-02-12 MED ORDER — ACETAMINOPHEN 325 MG PO TABS
650.0000 mg | ORAL_TABLET | Freq: Four times a day (QID) | ORAL | Status: DC | PRN
  Filled 2021-02-12: qty 2

## 2021-02-12 MED ORDER — HALOPERIDOL LACTATE 2 MG/ML PO CONC: 0.5000 mg | ORAL | Status: DC | PRN

## 2021-02-12 NOTE — Procedures (Signed)
Extubation Procedure Note  Patient Details:   Name: [name] Frank. DOB: [birth year] MRN: [id number]   Airway Documentation:    Vent end date: 02/12/21 Vent end time: 1635     Pt extubated to comfort care with RT X2 and RN at bedside.   Forest Becker 02/12/2021, 4:42 PM

## 2021-02-12 NOTE — Progress Notes (Signed)
NAME:  [name] Frank, [name] Frank MRN:  [id number], DOB:  06/12/1950, LOS: 7 ADMISSION DATE:  02-09-2021, CONSULTATION DATE:  11/27 REFERRING MD:  Audley Hose, CHIEF COMPLAINT:  Fall and AMS   History of Present Illness:  70 yo male brought to ER after falling and unresponsive.  GCS 7 in ED and he required intubation.  Initial neuroimaging and EEG were unrevealing.  He had MRI brain on 02/07/21 that showed acute infarct in the midbrain/cerebral peduncles with extension along the 3rd ventricle as well as small area of infarct in Lt occipital lobe.  Pertinent Medical History:  SDH/SAH, status epilepticus disorder, hydrocephalus s/p VP shunt (9/20), HTN, HLD, afib, DM2. CAD post DES (01/16) x1 to LAD  Significant Hospital Events: Including procedures, antibiotic start and stop dates in addition to other pertinent events   11/27 presented to [facility] as level 1 trauma. CT Head NAICA. CTA Head/Neck with new focal stenosis M2 branch, focal narrowing of distal L PCA, unchanged L vertebral artery occlusion (V1/V2) with irregular flow in distal V3/prox V4 (retrograde flow), redemonstrated basilar irregularity, no basilar thrombosis. 11/28 LTM EEG showing moderate to severe diffuse encephalopathy (nonspecific). No seizures. Echo with mildly decreased LVEF from prior, mild LVH, distal anteroseptal and apical hypokinesis (though evaluation limited by exam). MRI pending. EEG discontinued. 11/29 Remains low-grade febrile to Tmax 100.9. Decreased spontaneous LUE movement and decreased pupil size/reactivity, otherwise unchanged neuro exam. MRI Brain demonstrated acute infarct in the midbrain/cerebral peduncles (L > R) with extension along the third ventricle as well as a small area of infarct in the L occipital lobe. NSG consult for CSF valve evaluation post-scan. 11/30 Stroke team engaged. Weaning on vent, PSV 10/5. Neurologic exam grossly unchanged. 12/02 palliative care consulted 12/03 family meeting with palliative care >> plan  to transition to comfort care  Interim History / Subjective:  Remains on vent.  Objective:  Blood pressure 114/61, pulse 80, temperature 99.6 F (37.6 C), temperature source Axillary, resp. rate 18, height 5\' 9"  (1.753 m), weight 91.1 kg, SpO2 93 %.    Vent Mode: PRVC FiO2 (%):  [40 %] 40 % Set Rate:  [18 bmp] 18 bmp Vt Set:  [560 mL] 560 mL PEEP:  [5 cmH20] 5 cmH20 Pressure Support:  [10 cmH20] 10 cmH20 Plateau Pressure:  [14 cmH20-18 cmH20] 18 cmH20   Intake/Output Summary (Last 24 hours) at 02/12/2021 0837 Last data filed at 02/12/2021 0800 Gross per 24 hour  Intake 1543.5 ml  Output 1800 ml  Net -256.5 ml   Filed Weights   02/07/21 0500 02/10/21 0500 02/12/21 0500  Weight: 90.1 kg 90 kg 91.1 kg   Physical Examination:  General - on vent Eyes - pupils mid point and non reactive ENT - ETT in place Cardiac - regular rate/rhythm, no murmur Chest - equal breath sounds b/l, no wheezing or rales Abdomen - soft, non tender, + bowel sounds Extremities - no cyanosis, clubbing, or edema Skin - no rashes Neuro - no response to stimuli  Assessment:   Acute infarct in midbrain and cerebral pedunclues, consitent with artery of Percheron infarct, small left occipital lobe infarct and stable VP shunt. Hx of SAH/SDH, seizures. Compromised airway. DM type 2 poorly controlled with hyperglycemia. Urine retention. Hx of A fib, CAD s/p DES, HTN, HLD. Lactic acidosis   Plan:  - family waiting for priest to arrive and then transition to comfort care - palliative care team following - family requested on wife, daughter and son be provided medical information  Signature:  Chesley Mires, MD [place] Pager - [phone] 02/12/2021, 8:37 AM

## 2021-02-12 NOTE — Progress Notes (Addendum)
STROKE TEAM PROGRESS NOTE   INTERVAL HISTORY [name] Frank to come and do last rights prior to transitioning to comfort care, palliative care and CCM managing transition with family. Patient remains hemodynamically stable with no changes in his neurological exam. He remains on pressure support 10/5 and 40%.   Vitals:   02/12/21 0500 02/12/21 0600 02/12/21 0732 02/12/21 0800  BP: 118/66 112/61  114/61  Pulse: 79 71 81 80  Resp: 18 18 18 18   Temp:    99.6 F (37.6 C)  TempSrc:    Axillary  SpO2: 94% 90%  93%  Weight: 91.1 kg     Height:       CBC:  Recent Labs  Lab 02/07/21 0340 02/08/21 0414 02/09/21 0502  WBC 8.3 7.6 5.3  NEUTROABS 6.5 6.5  --   HGB 17.5* 16.2 14.9  HCT 51.5 49.1 45.9  MCV 87.3 89.3 89.0  PLT 187 173 0000000    Basic Metabolic Panel:  Recent Labs  Lab 02/09/21 0502 02/09/21 1701 02/10/21 0515  NA 142  --  147*  K 4.1  --  4.4  CL 110  --  111  CO2 24  --  26  GLUCOSE 292*  --  278*  BUN 38*  --  43*  CREATININE 0.92  --  0.91  CALCIUM 8.8*  --  9.1  MG 2.7* 2.8* 2.7*  PHOS 2.1* 3.2  --     Lipid Panel:  Recent Labs  Lab 02/09/21 0502  CHOL 168  TRIG 143  HDL 33*  CHOLHDL 5.1  VLDL 29  LDLCALC 106*    HgbA1c:  Recent Labs  Lab 01/28/2021 2128  HGBA1C 9.0*    Urine Drug Screen:  Recent Labs  Lab 02/07/2021 2016  LABOPIA NONE DETECTED  COCAINSCRNUR NONE DETECTED  LABBENZ NONE DETECTED  AMPHETMU NONE DETECTED  THCU NONE DETECTED  LABBARB NONE DETECTED     Alcohol Level  Recent Labs  Lab 01/10/2021 2317  ETH <10     IMAGING past 24 hours No results found.  PHYSICAL EXAM General:  Intubated, well-developed patient in no acute distress  Neurological:  Patient did not follow any commands.  Pupils nonreactive, minimal corneal reflex present, no oculocephalic reflex. Cough and gag reflexes present.  He does not blink to threat. No movement on right side to noxious stimulus, will intermittently have nonpurposeful movement on left.  Hyperreflexive in the left lower extremity  ASSESSMENT/PLAN [name]. is a 70 y.o. 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  His prognosis is poor, and he would likely require a tracheostomy and PEG tube if aggressive care measures were continued.  Lengthy discussion of patient's prognosis and the likely course of his illness was had with patient's wife.  Patient's children are traveling in from out of town, and the family intends to make a decision on goals of care when they arrive.  Palliative care consult placed.  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 VP shunt in place CTA head & neck Focal stenosis in right M2, stenosis in left P2, left vertebral artery occlusion in V1 and V2 segments, no basilar thrombus MRI  Acute infarct in midbrain and cerebral pedunclues,  consitent with artery of Percheron infarct, small left occipital lobe infarct and stable VP shunt 2D Echo EF 50-55%, mild LVH, no atrial level shunt LDL 148 HgbA1c 9.0 VTE prophylaxis - SCDs aspirin 81 mg daily prior to admission, now on aspirin 325mg   Therapy recommendations:  pending Disposition:  pending  Hypertension Home meds:  none Stable SBP goal <180 Long-term BP goal normotensive  Hyperlipidemia Home meds:  rosuvastatin 5 mg daily, resumed in hospital LDL 148, goal < 70 High intensity statin to be added depending on goals of care decision Continue statin at discharge  Diabetes type II Uncontrolled Home meds:  Invokana 100 mg daily HgbA1c 9.0, goal < 7.0 CBGs Hyperglycemia SSI Novolog 6 units q6h Diabetes management consult depending on goals of care  discussion  Paroxysmal Atrial Fibrillation Patient was not taking anticoagulation at home NSR seen on monitor Continue cardiac monitoring, may consider anticoagulation in the future if aggressive care  History of traumatic SDH/SAH with hydrocephalus S/p VPS VP shunt functioning normally Followed by Dr.  Respiratory failure Patient intubated due to inability to protect airway Ventilator management per primary team  Other Stroke Risk Factors Advanced Age >/= 1  Hx stroke/TIA  Other Active Problems VP shunt in place Shunt operating normally  Hospital day # 7  76 , Launa [place], FNP-BC [facility] Pager [phone] See Amion for schedule and pager information 02/12/2021 9:03 AM  ATTENDING ATTESTATION:  This is a patient with bilateral thalamic strokes history of subdural and subarachnoid hemorrhage status post VPS shunt.  His exam is unchanged from yesterday.  Family decided to transition to comfort care and waiting on priest to give the last rights before extubation.  Extubation is planned for 3 PM today  Dr. 14/06/2020 evaluated pt independently, reviewed imaging, chart, labs. Discussed and formulated plan with the APP. Please see APP note above for details.   I spent 35 minutes time in the care of this patient.   Clarity Ciszek,MD    To contact Stroke Continuity provider, please refer to Vivian[facility] Spare. After hours, contact General Neurology

## 2021-02-12 NOTE — Progress Notes (Signed)
Daily Progress Note   Patient Name: [name] Frank.       Date: 02/12/2021 DOB: 10/06/1950  Age: 70 y.o. MRN#: [id number] Attending Physician: Coralyn Helling, MD Primary Care Physician: Pearline Cables, MD Admit Date: 03/04/21 Length of Stay: 7 days  Reason for Consultation/Follow-up: Establishing goals of care  HPI/Patient Profile:  70 y.o. male  with past medical history of SDH/SAH, status epilepticus disorder, hydrocephalus s/p VP shunt (9/20), HTN, HLD, afib, DM2. CAD post DES (01/16) x1 to LAD admitted on 2021/03/04 with fall and unresponsiveness. His GCS was 7 in ED and he required intubation.  Initial neuroimaging and EEG were unrevealing.  He had MRI brain on 02/07/21 that showed acute infarct in the midbrain/cerebral peduncles with extension along the 3rd ventricle as well as small area of infarct in Lt occipital lobe.unresponsive.   EEG noted moderate to severe diffuse encephalopathy. Remains on the vent, weaning in progress. Found acute infarct in the midbrain and cerebral peduncles consistent with artery of Percheron infarct, small left occipital lobe infarct and stable VP shunt. Overall neuro feels he has a poor prognosis.   PMT was consulted for GOC.  Subjective:   Subjective: Chart Reviewed. Updates received. Patient Assessed. Created space and opportunity for patient  and family to explore thoughts and feelings regarding current medical situation.  Today's Discussion: I saw the patient at the bedside who remains unresponsive.  I spoke with the nurse.  She indicates concern about excessive secretions and I indicated I would start Robinul now in anticipation of shift to comfort care after last rights with a priest.  I discussed that if the priest is able to visit today we may start comfort drips overnight and plan for compassionate extubation in the morning, given the patient's wife's concern about awareness during extubation.  The nurse will notify me when the patient's  family arrives to visit for the day.  I spoke with the patient's wife, son, daughter via phone.  They indicated that someone is coming giving him his last rights and they are ready to proceed with comfort care.  Based on her concerns about awareness and comfort I recommended starting the morphine drip now, adding as needed orders now, removing any noncomfort related interventions, medications, etc.  After few hours of medications on board to ensure comfort we will plan for compassionate extubation at 3:00.  The family states they will get their sometime a little after 3 to be there but they prefer to not be there during the extubation.  I came to the unit at 2:45 to prepare for planned compassionate extubation at 3:00. RT and nursing noted he's not breathing much over the vent, moving very little air. Called family and informed them of this and that he may pass sooner the anticipated and we elected to hold off until they could arrive. They spent time with the patient upon arrival. We completed compassionate extubation to room air at 4:30pm. Provided support to the family during this time.  Review of Systems  Unable to perform ROS: Patient unresponsive   Objective:   Vital Signs:  BP 125/71   Pulse 80   Temp 99.6 F (37.6 C) (Axillary)   Resp 20   Ht 5\' 9"  (1.753 m)   Wt 91.1 kg   SpO2 93%   BMI 29.66 kg/m   Physical Exam: Physical Exam Vitals and nursing note reviewed.  Constitutional:      General: He is not in acute distress.    Appearance:  He is ill-appearing.  HENT:     Head: Normocephalic and atraumatic.  Cardiovascular:     Rate and Rhythm: Normal rate and regular rhythm.  Pulmonary:     Effort: Pulmonary effort is normal. No respiratory distress.     Breath sounds: Rhonchi present.  Abdominal:     General: Abdomen is flat.     Palpations: Abdomen is soft.  Skin:    General: Skin is warm and dry.  Neurological:     Mental Status: He is unresponsive.    Palliative  Assessment/Data: 10%   Assessment & Plan:   Impression: Present on Admission:  Altered mental status  70 year old male with multiple chronic conditions as described above now admitted with an acute infarct in midbrain/cerebral peduncles with extension into the third ventricle and small area of infarct in the left occipital lobe.  He is been noted to be intermittently unresponsive and since yeasterday full unresponsive.  Findings consistent with Percheron infarct.  Overall neuro prognosis is poor.  After family meeting his wife, son, daughter understood his expectation of poor neurological outcome and high risk of death.  They have agreed to shift to comfort care once he can receive last rights from the priest.  We will continue to follow and make the transition when they are ready and after he has been seen by his priest.  SUMMARY OF RECOMMENDATIONS   Completed compassionate extubation to room air Continue comfort medications as ordered Please notify PMT of any significant changes or needs PMT will continue to follow  Code Status: DNR  Prognosis: Hours - Days  Discharge Planning: Anticipated Hospital Death  Discussed with: Medical team, nursing team,   Thank you for allowing [place] to participate in the care of [name] Frank. PMT will continue to support holistically.  Time Total: 120 min  Visit consisted of counseling and education dealing with the complex and emotionally intense issues of symptom management and palliative care in the setting of serious and potentially life-threatening illness. Greater than 50%  of this time was spent counseling and coordinating care related to the above assessment and plan.  Walden Field, NP Palliative Medicine Team  Team Phone # [phone] (Nights/Weekends)  11/08/2020, 8:17 AM

## 2021-02-13 DIAGNOSIS — J988 Other specified respiratory disorders: Secondary | ICD-10-CM | POA: Diagnosis not present

## 2021-02-13 DIAGNOSIS — I6312 Cerebral infarction due to embolism of basilar artery: Secondary | ICD-10-CM | POA: Diagnosis not present

## 2021-02-13 DIAGNOSIS — R401 Stupor: Secondary | ICD-10-CM | POA: Diagnosis not present

## 2021-02-14 ENCOUNTER — Telehealth: Payer: Self-pay | Admitting: Family Medicine

## 2021-02-14 NOTE — Telephone Encounter (Signed)
Called and spoke with patient wife and son, offered my condolences on the loss of Mr Lederman

## 2021-02-23 ENCOUNTER — Ambulatory Visit: Payer: Medicare HMO | Admitting: Family Medicine

## 2021-03-12 NOTE — Progress Notes (Signed)
  Progress Note   Date: 02/17/2021  Patient Name: [name] Frank.        MRN#: [id number]   The medication of sodium phosphate was given for treatment of the diagnosis/condition of hypophoshatemia .

## 2021-03-12 NOTE — Progress Notes (Signed)
Daily Progress Note   Patient Name: [name] Frank.       Date: 03/05/2021 DOB: 1950/07/07  Age: 71 y.o. MRN#: [id number] Attending Physician: Lynnell Catalan, MD Primary Care Physician: Pearline Cables, MD Admit Date: 02/15/2021 Length of Stay: 8 days  Reason for Consultation/Follow-up: Establishing goals of care, Terminal Care, and Withdrawal of life-sustaining treatment  HPI/Patient Profile:  71 y.o. male  with past medical history of SDH/SAH, status epilepticus disorder, hydrocephalus s/p VP shunt (9/20), HTN, HLD, afib, DM2. 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 His GCS was 7 in ED and he required intubation.  Initial neuroimaging and EEG were unrevealing.  He had MRI brain on 02/07/21 that showed acute infarct in the midbrain/cerebral peduncles with extension along the 3rd ventricle as well as small area of infarct in Lt occipital lobe.unresponsive.   EEG noted moderate to severe diffuse encephalopathy. Remains on the vent, weaning in progress. Found acute infarct in the midbrain and cerebral peduncles consistent with artery of Percheron infarct, small left occipital lobe infarct and stable VP shunt. Overall neuro feels he has a poor prognosis.   PMT was consulted for GOC.  Subjective:   Subjective: Chart Reviewed. Updates received. Patient Assessed. Created space and opportunity for patient  and family to explore thoughts and feelings regarding current medical situation.  Today's Discussion: Today I saw the patient at the bedside.  His wife is present as well.  She has been holding his hand in speaking with him, music playing.  We had a long discussion about how she is doing emotionally as she has been a bit shaky over the past couple days.  She states that she and her family have come to a point of peace about the situation.  I mentioned that he appears comfortable and she agrees.  I note he is breathing a little bit fast but this is  can be expected with neurological issues.  She does not seem distressed about this.  Specifically, no excessive secretions or gurgling.  We spent time sharing stories and reminiscing.  I explained that, per my conversation with the nurse, there he may attempt to transfer him to 6 N. for comfort care and she is okay with this.  I informed her I would try to stop by later today to check on her but she can call us at any point.  I provided emotional general support through reminiscing, therapeutic listening, sharing of stories, empathy, and other techniques.  I answered all questions and addressed all concerns to the best of my ability  Review of Systems  Unable to perform ROS: Patient unresponsive   Objective:   Vital Signs:  BP (!) 109/59   Pulse (!) 121   Temp 99.6 F (37.6 C) (Axillary)   Resp (!) 28   Ht 5\' 9"  (1.753 m)   Wt 91.1 kg   SpO2 (!) 61%   BMI 29.66 kg/m   Physical Exam: Physical Exam Vitals and nursing note reviewed.  Constitutional:      Appearance: He is ill-appearing.  HENT:     Head: Normocephalic and atraumatic.  Cardiovascular:     Rate and Rhythm: Regular rhythm. Tachycardia present.  Pulmonary:     Effort: Tachypnea present. No respiratory distress.  Abdominal:     General: Abdomen is flat.     Palpations: Abdomen is soft.  Skin:    General: Skin is warm and dry.  Neurological:  Mental Status: He is unresponsive.    Palliative Assessment/Data: 10%   Assessment & Plan:   Impression: Present on Admission:  Altered mental status  71 year old male with multiple chronic conditions as described in consult note now admitted with an acute infarct in midbrain/cerebral peduncles with extension into the third ventricle and small area of infarct in the left occipital lobe.  He became unresponsive despite aggressive care. Findings consistent with Percheron infarct.  Overall neuro prognosis is poor.  After family meeting his wife, son, daughter understood his  expectation of poor neurological outcome and high risk of death and elected to transition to comfort care. Today he appears comfortable, saturations in the 60s-70s, mildly tachypneic, no distress noted.  SUMMARY OF RECOMMENDATIONS   Continue comfort care Comfort meds per orders PMT will continue to follow  Code Status: DNR  Prognosis: Hours - Days  Discharge Planning: Anticipated Hospital Death  Discussed with: Medical team, nursing team, patient's family  Thank you for allowing [place] to participate in the care of [name]. PMT will continue to support holistically.  Time Total: 35 min  Visit consisted of counseling and education dealing with the complex and emotionally intense issues of symptom management and palliative care in the setting of serious and potentially life-threatening illness. Greater than 50%  of this time was spent counseling and coordinating care related to the above assessment and plan.  Walden Field, NP Palliative Medicine Team  Team Phone # [phone] (Nights/Weekends)  11/08/2020, 8:17 AM

## 2021-03-12 NOTE — Progress Notes (Signed)
NAME:  [name] Frank, [place] MRN:  [id number], DOB:  06-Mar-1951, LOS: 8 ADMISSION DATE:  02/02/2021, CONSULTATION DATE:  11/27 REFERRING MD:  Audley Hose, CHIEF COMPLAINT:  Fall and AMS   History of Present Illness:  71 yo male brought to ER after falling and unresponsive.  GCS 7 in ED and he required intubation.  Initial neuroimaging and EEG were unrevealing.  He had MRI brain on 02/07/21 that showed acute infarct in the midbrain/cerebral peduncles with extension along the 3rd ventricle as well as small area of infarct in Lt occipital lobe.  Pertinent Medical History:  SDH/SAH, status epilepticus disorder, hydrocephalus s/p VP shunt (9/20), HTN, HLD, afib, DM2. 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 CT Head NAICA. CTA Head/Neck with new focal stenosis M2 branch, focal narrowing of distal L PCA, unchanged L vertebral artery occlusion (V1/V2) with irregular flow in distal V3/prox V4 (retrograde flow), redemonstrated basilar irregularity, no basilar thrombosis. 11/28 LTM EEG showing moderate to severe diffuse encephalopathy (nonspecific). No seizures. Echo with mildly decreased LVEF from prior, mild LVH, distal anteroseptal and apical hypokinesis (though evaluation limited by exam). MRI pending. EEG discontinued. 11/29 Remains low-grade febrile to Tmax 100.9. Decreased spontaneous LUE movement and decreased pupil size/reactivity, otherwise unchanged neuro exam. MRI Brain demonstrated acute infarct in the midbrain/cerebral peduncles (L > R) with extension along the third ventricle as well as a small area of infarct in the L occipital lobe. NSG consult for CSF valve evaluation post-scan. 11/30 Stroke team engaged. Weaning on vent, PSV 10/5. Neurologic exam grossly unchanged. 12/02 palliative care consulted 12/03 family meeting with palliative care >> plan  to transition to comfort care  Interim History / Subjective:  Remains on vent.  Objective:  Blood pressure (!) 109/59, pulse (!) 121, temperature 99.6 F (37.6 C), temperature source Axillary, resp. rate (!) 28, height 5\' 9"  (1.753 m), weight 91.1 kg, SpO2 (!) 61 %.    Vent Mode: PRVC FiO2 (%):  [40 %] 40 % Set Rate:  [18 bmp] 18 bmp Vt Set:  [560 mL] 560 mL PEEP:  [5 cmH20] 5 cmH20 Plateau Pressure:  [17 cmH20] 17 cmH20   Intake/Output Summary (Last 24 hours) at 19-Feb-2021 1018 Last data filed at 02/19/21 0600 Gross per 24 hour  Intake 335.14 ml  Output 1125 ml  Net -789.86 ml    Filed Weights   02/07/21 0500 02/10/21 0500 02/12/21 0500  Weight: 90.1 kg 90 kg 91.1 kg   Physical Examination:  General - on comfort care.  Eyes - pupils mid point and non reactive ENT -extubated. Cardiac - regular rate/rhythm, no murmur Chest - equal breath sounds b/l, no wheezing or rales Abdomen - soft, non tender, + bowel sounds Extremities - no cyanosis, clubbing, or edema Skin - no rashes Neuro - no response to stimuli  Assessment:   Acute infarct in midbrain and cerebral pedunclues, consitent with artery of Percheron infarct, small left occipital lobe infarct and stable VP shunt. Hx of SAH/SDH, seizures. Compromised airway. DM type 2 poorly controlled with hyperglycemia. Urine retention. Hx of A fib, CAD s/p DES, HTN, HLD. Lactic acidosis   Plan:  - Transferred to palliative care  - Palliative care is following.   Lynnell Catalan, MD [facility] ICU Physician [facility]  Pager: [phone] Or Epic Secure Chat After hours: [phone].  Feb 19, 2021,  10:19 AM     02/12/2021, 10:18 AM

## 2021-03-12 NOTE — Progress Notes (Signed)
  Progress Note   Date: 02/17/2021  Patient Name: [name] Frank.        MRN#: [id number]  Review of the patient's clinical findings supports the diagnosis of  After further study, ARF ruled out

## 2021-03-12 NOTE — Progress Notes (Signed)
Pts wife departed bedside a little while ago to go home and freshen up. TOD noted at 1138. No heart or breath sounds auscultated. TOD pronounced by Luther Bradley, RN and Leafy Ro, RN. Pts wife notified of passing. She plans to come to bedside with children.  [place] referral: 02/17/2021-047. Pt is a possible tissue only donor. Medical examiner notified, awaiting callback for confirmation.

## 2021-03-12 NOTE — Discharge Summary (Signed)
DEATH SUMMARY   Patient Details  Name: [name] Frank. MRN: [id number] DOB: Jun 10, 1950  Admission/Discharge Information   Admit Date:  Feb 13, 2021  Date of Death: Date of Death: 02-21-2021  Time of Death: Time of Death: 06-26-36  Length of Stay: 8  Referring Physician: No primary care provider on file.   Reason(s) for Hospitalization  Stroke  Diagnoses  Preliminary cause of death: branstem stroke.  Secondary Diagnoses (including complications and co-morbidities):  Principal Problem:   Altered mental status Active Problems:   Cerebral embolism with cerebral infarction   Respiratory failure [facility])   Brief Hospital Course (including significant findings, care, treatment, and services provided and events leading to death)  [name] Moodie. is a 71 y.o. year old male who was brought to ED  after falling.  He was unresponsive in ED and he required intubation.  Initial neuroimaging and EEG were unrevealing.  He had MRI brain on 02/07/21 that showed acute infarct in the midbrain/cerebral peduncles with extension along the 3rd ventricle as well as small area of infarct in Lt occipital lobe.  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 CAD post DES (01/16) x1 to LAD.  There was little improvement over the ensuing 3 days. Palliative care was consulted and family opted for comfort care and the patient was compassionately extubated and passed away.   No autopsy. Not ME case.   Pertinent Labs and Studies  Significant Diagnostic Studies CT ANGIO HEAD NECK W WO CM  Result Date: 03/09/2021 CLINICAL DATA:  "Basilar thrombosis" EXAM: CT ANGIOGRAPHY HEAD AND NECK TECHNIQUE: Multidetector CT imaging of the head and neck was performed using the standard protocol during bolus administration of intravenous contrast. Multiplanar CT image reconstructions and MIPs were obtained to evaluate the vascular anatomy. Carotid stenosis  measurements (when applicable) are obtained utilizing NASCET criteria, using the distal internal carotid diameter as the denominator. CONTRAST:  171mL OMNIPAQUE IOHEXOL 350 MG/ML SOLN COMPARISON:  CTA 08/11/2019, correlation is also made with same day CT head. FINDINGS: CT HEAD FINDINGS For noncontrast findings, please see same day CT head. CTA NECK FINDINGS Aortic arch: Standard branching. Imaged portion shows no evidence of aneurysm or dissection. No significant stenosis of the major arch vessel origins. Aortic atherosclerosis. Right carotid system: No evidence of dissection, stenosis (50% or greater) or occlusion. Left carotid system: No evidence of dissection, stenosis (50% or greater) or occlusion. Vertebral arteries: Focal narrowing just distal to the takeoff of the right vertebral artery (series 7, image 575), unchanged. The right vertebral artery is otherwise patent. 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 Skeleton: No acute osseous abnormality. Other neck: Endotracheal tube and orogastric tube noted. Otherwise negative. Upper chest: No focal pulmonary opacity or pleural effusion. Debris in the trachea and right mainstem bronchus. Review of the MIP images confirms the above findings CTA HEAD FINDINGS Anterior circulation: Both internal carotid arteries are patent to the termini, without stenosis or other abnormality. A1 segments patent. The anterior communicating artery is not visualized. Anterior cerebral arteries are noted at patent to their distal aspects. No M1 stenosis or occlusion. Normal MCA bifurcations. Focal stenosis in a right M2 branch (series 7, images 230), which is new from the prior exam. MCA branches otherwise perfused and symmetric. Posterior circulation: Reconstitution of flow in the distal  left V3 and V4 segments, overall unchanged. The right  vertebral artery is patent to the vertebrobasilar junction. Posterior inferior cerebral arteries patent bilaterally. Basilar is irregular but patent to its distal aspect. Superior cerebellar arteries patent bilaterally. Near fetal origin of the left PCA, with focal narrowing of the distal left posterior communicating artery proximal to the P1-P2 junction (series 7, image 241), which is new from the prior exam. Redemonstrated focal stenosis in the left P2 segment (series 7, image 235). The left PCA is otherwise perfused although it is irregular. Normal right PCA. The right posterior communicating artery is not visualized. Venous sinuses: As permitted by contrast timing, patent. Anatomic variants: None significant Review of the MIP images confirms the above findings IMPRESSION: 1. New focal stenosis in a right M2 branch, with otherwise patent anterior circulation. 2. Focal narrowing of the distal left posterior communicating artery, which is new from prior exam, with redemonstrated focal stenosis in the left P2 segment. The remainder of the left PCA is perfused but irregular. 3. 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 4. Debris in the trachea and right mainstem bronchus. 5. Redemonstrated basilar irregularity, with no evidence of basilar thrombosis. Electronically Signed   By: Merilyn Baba M.D.   On: 01/14/2021 21:38   DG Abdomen 1 View  Result Date: 02/08/2021 CLINICAL DATA:  Post intubation. EXAM: PORTABLE CHEST 1 VIEW, portable two views abdomen. COMPARISON:  Chest x-ray 08/11/2019. FINDINGS: Endotracheal tube tip is 2.5 cm above the carina. Enteric tube tip is at the level of the proximal duodenum. VP shunt overlies the right chest and abdomen. Distal tip is in the left lower quadrant. Visualized portion of the catheter appears intact. The lungs are clear. The cardiomediastinal  silhouette is within normal limits. There is no pleural effusion or pneumothorax. Small calcified nodule seen in the right upper lobe. Bowel-gas pattern is normal. There are no suspicious calcifications. No acute fractures are seen. IMPRESSION: 1. Lines and tubes as above. 2. Lungs are clear. 3. Normal bowel-gas pattern. Electronically Signed   By: Ronney Asters M.D.   On: 01/20/2021 18:44   CT HEAD WO CONTRAST  Result Date: 02/08/2021 CLINICAL DATA:  Trauma. EXAM: CT HEAD WITHOUT CONTRAST CT CERVICAL SPINE WITHOUT CONTRAST TECHNIQUE: Multidetector CT imaging of the head and cervical spine was performed following the standard protocol without intravenous contrast. Multiplanar CT image reconstructions of the cervical spine were also generated. COMPARISON:  CT head 01/16/2021.  MRI brain 08/12/2019. FINDINGS: CT HEAD FINDINGS Brain: No evidence of acute infarction, hemorrhage, extra-axial collection or mass lesion/mass effect. Mild diffuse atrophy is unchanged. Right frontal ventriculostomy catheter is stable in position ending in the frontal horn of the right lateral ventricle. Ventricular size is mildly dilated and unchanged. There is stable mild periventricular white matter hypodensity, likely chronic small vessel ischemic change. 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 Vascular: No hyperdense vessel or unexpected calcification. Skull: Normal. Negative for fracture or focal lesion. Sinuses/Orbits: No acute finding. Other: None. CT CERVICAL SPINE FINDINGS Alignment: Normal. Skull base and vertebrae: No acute fracture. No primary bone lesion or focal pathologic process. Soft tissues and spinal canal: No prevertebral fluid or swelling. No visible canal hematoma. Disc levels: There is mild disc space narrowing and endplate osteophyte formation compatible with degenerative change at C4-C5. No significant central canal or neural foraminal stenosis at any  level. Upper chest: Negative. Other:  There is fluid in the nasopharynx. Enteric tube is present. Patient is intubated. IMPRESSION: 1.  No acute intracranial process. 2. No acute fracture or traumatic subluxation of the cervical spine. 3.  VP shunt is unchanged in position.  Ventricular size is stable. Electronically Signed   By: Ronney Asters M.D.   On: 01/14/2021 18:04   CT HEAD WO CONTRAST (5MM)  Result Date: 01/16/2021 CLINICAL DATA:  Malfunctioning shunt. Preoperative evaluation. History of normal pressure hydrocephalus. EXAM: CT HEAD WITHOUT CONTRAST TECHNIQUE: Contiguous axial images were obtained from the base of the skull through the vertex without intravenous contrast. COMPARISON:  08/12/2019 FINDINGS: Brain: No focal abnormality affects the brainstem or cerebellum. Cerebral hemispheres show old small vessel infarctions in the right basal ganglia and internal capsule which were not present in June of 2021. There is a right frontal VP shunt with typical surrounding gliosis. Shunt tip is in the frontal horn of the right lateral ventricle. Ventricular size appears similar to the MRI last year. No sign of acute infarction, mass lesion or intraparenchymal hemorrhage. The patient has a tiny subdural hematoma along the left frontal convexity, no more than 1-2 mm in thickness. Vascular: No abnormal vascular finding. Skull: Otherwise negative Sinuses/Orbits: Clear/normal Other: None IMPRESSION: VP shunt in place from a right frontal approach. Ventricular size is stable compared to the studies of last year. 1-2 mm thick subdural hematoma along the left frontal lateral convexity without mass-effect. Old appearing small vessel infarctions in the right basal ganglia and internal capsule, not present in June of 2021. Electronically Signed   By: Nelson Chimes M.D.   On: 01/16/2021 12:57   CT CERVICAL SPINE WO CONTRAST  Result Date: 01/24/2021 CLINICAL DATA:  Trauma. EXAM: CT HEAD WITHOUT CONTRAST CT CERVICAL SPINE WITHOUT CONTRAST TECHNIQUE:  Multidetector CT imaging of the head and cervical spine was performed following the standard protocol without intravenous contrast. Multiplanar CT image reconstructions of the cervical spine were also generated. COMPARISON:  CT head 01/16/2021.  MRI brain 08/12/2019. FINDINGS: CT HEAD FINDINGS Brain: No evidence of acute infarction, hemorrhage, extra-axial collection or mass lesion/mass effect. Mild diffuse atrophy is unchanged. Right frontal ventriculostomy catheter is stable in position ending in the frontal horn of the right lateral ventricle. Ventricular size is mildly dilated and unchanged. There is stable mild periventricular white matter hypodensity, likely chronic small vessel ischemic change. 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 Vascular: No hyperdense vessel or unexpected calcification. Skull: Normal. Negative for fracture or focal lesion. Sinuses/Orbits: No acute finding. Other: None. CT CERVICAL SPINE FINDINGS Alignment: Normal. Skull base and vertebrae: No acute fracture. No primary bone lesion or focal pathologic process. Soft tissues and spinal canal: No prevertebral fluid or swelling. No visible canal hematoma. Disc levels: There is mild disc space narrowing and endplate osteophyte formation compatible with degenerative change at C4-C5. No significant central canal or neural foraminal stenosis at any level. Upper chest: Negative. Other: There is fluid in the nasopharynx. Enteric tube is present. Patient is intubated. IMPRESSION: 1.  No acute intracranial process. 2. No acute fracture or traumatic subluxation of the cervical spine. 3.  VP shunt is unchanged in position.  Ventricular size is stable. Electronically Signed   By: Ronney Asters M.D.   On: 02/04/2021 18:04   MR BRAIN W WO CONTRAST  Result Date: 02/07/2021 CLINICAL DATA:  History of fall and intracranial hemorrhage, shunted for hydrocephalus, found unconscious EXAM: MRI HEAD WITHOUT  AND WITH CONTRAST  TECHNIQUE: Multiplanar, multiecho pulse sequences of the brain and surrounding structures were obtained without and with intravenous contrast. CONTRAST:  68mL GADAVIST GADOBUTROL 1 MMOL/ML IV SOLN COMPARISON:  CT/CTA head and neck 02/04/2021 FINDINGS: Brain: There is a right frontal approach ventricular catheter in place terminating in the body of the right lateral ventricle. Ventricular system is stable in size and configuration compared to the prior head CT, without evidence of transependymal flow of CSF. There is mild FLAIR signal abnormality around the catheter, similar to prior studies. There is confluent diffusion restriction and FLAIR signal abnormality in the bilateral ventromedial thalami extending along the third ventricle to the midbrain/cerebral peduncles (left significantly more than right). There is an additional small focus of diffusion restriction in the left occipital lobe. There is no evidence of associated hemorrhage. There is a small remote lacunar infarct in the left basal ganglia. There is diffuse pachymeningeal thickening and enhancement. There is no abnormal parenchymal enhancement or solid mass lesion. There is no midline shift. Vascular: The left V4 segment flow void is attenuated proximally, in keeping with atherosclerotic disease seen on prior CTA head. The other major flow voids are present. Skull and upper cervical spine: Normal marrow signal. Sinuses/Orbits: There is mild mucosal thickening in the paranasal sinuses. Bilateral lens implants are in place. The globes and orbits are otherwise unremarkable. Other: Bilateral mastoid effusions and fluid in the airway is likely related to instrumentation. IMPRESSION: 1. 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 2. Additional small area of infarct in the left occipital lobe 3. Stable right  frontal ventricular catheter with unchanged size and configuration of the ventricular system. 4. Diffuse pachymeningeal thickening and enhancement, nonspecific and likely postprocedural given lack of other findings to suggest over shunting or infection. Electronically Signed   By: Valetta Mole M.D.   On: 02/07/2021 10:31   DG Pelvis Portable  Result Date: 01/12/2021 CLINICAL DATA:  Shunt, fall. EXAM: PORTABLE PELVIS 1-2 VIEWS COMPARISON:  Pelvis x-ray 07/14/2018. FINDINGS: There is no evidence of pelvic fracture or diastasis. No pelvic bone lesions are seen. IMPRESSION: Negative. Electronically Signed   By: Ronney Asters M.D.   On: 01/26/2021 18:44   DG CHEST PORT 1 VIEW  Result Date: 02/08/2021 CLINICAL DATA:  Fever EXAM: PORTABLE CHEST 1 VIEW COMPARISON:  01/30/2021 FINDINGS: Endotracheal tube and partially imaged enteric tube and shunt catheter. Low lung volumes. Patchy density at the right lung base. No pleural effusion. Stable cardiomediastinal contours. IMPRESSION: Patchy atelectasis/consolidation at the right lung base. Electronically Signed   By: Macy Mis M.D.   On: 02/08/2021 09:00   DG Chest Portable 1 View  Result Date: 02/08/2021 CLINICAL DATA:  Post intubation. EXAM: PORTABLE CHEST 1 VIEW, portable two views abdomen. COMPARISON:  Chest x-ray 08/11/2019. FINDINGS: Endotracheal tube tip is 2.5 cm above the carina. Enteric tube tip is at the level of the proximal duodenum. VP shunt overlies the right chest and abdomen. Distal tip is in the left lower quadrant. Visualized portion of the catheter appears intact. The lungs are clear. The cardiomediastinal silhouette is within normal limits. There is no pleural effusion or pneumothorax. Small calcified nodule seen in the right upper lobe. Bowel-gas pattern is normal. There are no suspicious calcifications. No acute fractures are seen. IMPRESSION: 1. Lines and tubes as above. 2. Lungs are clear. 3. Normal bowel-gas pattern. Electronically  Signed   By: Darliss Cheney M.D.   On: 01/12/2021 18:44   EEG adult  Result Date: 02/06/2021 Charlsie Quest, MD     02/06/2021 10:10 AM Patient Name: [name] Frank. MRN: [id number] Epilepsy Attending: Charlsie Quest Referring Physician/Provider: Dr Erick Blinks Date: 01/18/2021 Duration: 21.13 mins Patient history: 71 year old male with past medical history of traumatic subdural hematomas and subarachnoid hemorrhage who presented with altered mental status.  EEG 20 for seizure. Level of alertness: lethargic AEDs during EEG study: LEV Technical aspects: This EEG study was done with scalp electrodes positioned according to the 10-20 International system of electrode placement. Electrical activity was acquired at a sampling rate of 500Hz  and reviewed with a high frequency filter of 70Hz  and a low frequency filter of 1Hz . EEG data were recorded continuously and digitally stored. Description: EEG showed continuous generalized 3 to 6 Hz theta-delta slowing admixed with 15 to 18 Hz frontocentral beta activity. Hyperventilation and photic stimulation were not performed.   ABNORMALITY - Continuous slow, generalized IMPRESSION: This study is suggestive of moderate to severe diffuse encephalopathy, nonspecific etiology.  No seizures or epileptiform discharges were seen throughout the recording.   ECHOCARDIOGRAM COMPLETE  Result Date: 02/06/2021    ECHOCARDIOGRAM REPORT   Patient Name:   [name] Frank. Date of Exam: 02/06/2021 Medical Rec #:  02/08/2021         Height:       69.0 in Accession #:    [name] Frank        Weight:       199.3 lb Date of Birth:  [birth year]         BSA:          2.063 m Patient Age:    70 years          BP:           127/76 mmHg Patient Gender: M                 HR:           76 bpm. Exam Location:  Inpatient Procedure: 2D Echo, Cardiac Doppler and Color Doppler Indications:    Abnormal EKG  History:        Patient has prior history of Echocardiogram examinations,  most                 recent 07/18/2018. CAD, Arrythmias:Atrial Fibrillation; Risk                 Factors:Hypertension, Diabetes and Dyslipidemia.  Sonographer:    08/24/1950 Referring Phys: 09-06-1986 09/17/2018  Sonographer Comments: Technically difficult study due to poor echo windows and echo performed with patient supine and on artificial respirator. IMPRESSIONS  1. POor acoustic windows. APical window is foreshortened. Overall LVEF is normal There is distal anteroseptal, apical hypokinesis/akinesis. DIfficult to compare to previous echo given acoustic windows/foreshortening. Overall, LVEF appearss mildly decreased from previous COnsider limited echo when pt can turn, is not on ventilator. . Left ventricular ejection fraction, by estimation, is 50 to 55%. The left ventricle has low normal function. The left ventricle has no regional wall motion abnormalities. There is mild left ventricular hypertrophy.  2. Right ventricular systolic function is low normal. The right ventricular size is mildly enlarged.  3. The mitral valve is normal in structure. Trivial mitral valve regurgitation.  4. The aortic valve is normal in structure. Aortic valve regurgitation is not visualized. FINDINGS  Left Ventricle: POor acoustic windows. APical window is foreshortened. Overall LVEF is normal There is distal anteroseptal, apical hypokinesis/akinesis. DIfficult to compare to previous echo given acoustic windows/foreshortening. Overall, LVEF appearss mildly decreased from previous COnsider limited echo when pt can turn, is not on ventilator. Left ventricular ejection fraction, by estimation, is 50 to 55%. The left ventricle has low normal function. The left ventricle has no regional wall motion abnormalities. The left ventricular internal cavity size was normal in size. There is mild left ventricular hypertrophy. Right Ventricle: The right ventricular size is mildly enlarged. Right vetricular wall thickness was not assessed.  Right ventricular systolic function is low normal. Left Atrium: Left atrial size was normal in size. Right Atrium: Right atrial size was normal in size. Pericardium: There is no evidence of pericardial effusion. Mitral Valve: The mitral valve is normal in structure. There is mild thickening of the mitral valve leaflet(s). Trivial mitral valve regurgitation. MV peak gradient, 2.2 mmHg. The mean mitral valve gradient is 1.0 mmHg. Tricuspid Valve: The tricuspid valve is normal in structure. Tricuspid valve regurgitation is trivial. Aortic Valve: The aortic valve is normal in structure. Aortic valve regurgitation is not visualized. Aortic valve mean gradient measures 2.0 mmHg. Aortic valve peak gradient measures 3.7 mmHg. Aortic valve area, by VTI measures 2.36 cm. Pulmonic Valve: The pulmonic valve was not assessed. Aorta: The aortic root is normal in size and structure. IAS/Shunts: No atrial level shunt detected by color flow Doppler.  LEFT VENTRICLE PLAX 2D LVIDd:         3.30 cm LVIDs:         2.30 cm LV PW:         1.20 cm LV IVS:        1.40 cm LVOT diam:     2.00 cm LV SV:         44 LV SV Index:   21 LVOT Area:     3.14 cm  LV Volumes (MOD) LV vol d, MOD A2C: 30.1 ml LV vol d, MOD A4C: 44.6 ml LV vol s, MOD A2C: 16.4 ml LV vol s, MOD A4C: 19.2 ml LV SV MOD A2C:     13.7 ml LV SV MOD A4C:     44.6 ml LV SV MOD BP:      21.5 ml RIGHT VENTRICLE RV Basal diam:  3.30 cm RV Mid diam:    3.10 cm LEFT ATRIUM             Index       RIGHT ATRIUM           Index LA diam:        2.60 cm 1.26 cm/m  RA Area:     10.60 cm LA Vol (A2C):   11.5 ml 5.57 ml/m  RA Volume:   22.00 ml  10.66 ml/m LA Vol (A4C):   20.2 ml 9.79 ml/m LA Biplane Vol: 15.5 ml 7.51 ml/m  AORTIC VALVE AV Area (Vmax):    2.74 cm AV Area (Vmean):   3.07 cm AV Area (VTI):     2.36 cm AV Vmax:           96.70 cm/s AV Vmean:          64.300 cm/s AV VTI:            0.188 m AV Peak Grad:      3.7 mmHg AV Mean Grad:      2.0 mmHg LVOT Vmax:  84.20  cm/s LVOT Vmean:        62.800 cm/s LVOT VTI:          0.141 m LVOT/AV VTI ratio: 0.75  AORTA Ao Root diam: 3.40 cm MITRAL VALVE MV Area (PHT): 2.56 cm    SHUNTS MV Area VTI:   2.06 cm    Systemic VTI:  0.14 m MV Peak grad:  2.2 mmHg    Systemic Diam: 2.00 cm MV Mean grad:  1.0 mmHg MV Vmax:       0.75 m/s MV Vmean:      40.6 cm/s MV Decel Time: 296 msec MV E velocity: 48.80 cm/s MV A velocity: 67.70 cm/s MV E/A ratio:  0.72 Dorris Carnes MD Electronically signed by Dorris Carnes MD Signature Date/Time: 02/06/2021/4:15:59 PM    Final     Microbiology Recent Results (from the past 240 hour(s))  Resp Panel by RT-PCR (Flu A&B, Covid) Nasopharyngeal Swab     Status: None   Collection Time: 01/19/2021  5:33 PM   Specimen: Nasopharyngeal Swab; Nasopharyngeal(NP) swabs in vial transport medium  Result Value Ref Range Status   SARS Coronavirus 2 by RT PCR NEGATIVE NEGATIVE Final    Comment: (NOTE) SARS-CoV-2 target nucleic acids are NOT DETECTED.  The SARS-CoV-2 RNA is generally detectable in upper respiratory specimens during the acute phase of infection. The lowest concentration of SARS-CoV-2 viral copies this assay can detect is 138 copies/mL. A negative result does not preclude SARS-Cov-2 infection and should not be used as the sole basis for treatment or other patient management decisions. A negative result may occur with  improper specimen collection/handling, submission of specimen other than nasopharyngeal swab, presence of viral mutation(s) within the areas targeted by this assay, and inadequate number of viral copies(<138 copies/mL). A negative result must be combined with clinical observations, patient history, and epidemiological information. The expected result is Negative.  Fact Sheet for Patients:  EntrepreneurPulse.com.au  Fact Sheet for Healthcare Providers:  IncredibleEmployment.be  This test is no t yet approved or cleared by the [place]  FDA and  has been authorized for detection and/or diagnosis of SARS-CoV-2 by FDA under an Emergency Use Authorization (EUA). This EUA will remain  in effect (meaning this test can be used) for the duration of the COVID-19 declaration under Section 564(b)(1) of the Act, 21 U.S.C.section 360bbb-3(b)(1), unless the authorization is terminated  or revoked sooner.       Influenza A by PCR NEGATIVE NEGATIVE Final   Influenza B by PCR NEGATIVE NEGATIVE Final    Comment: (NOTE) The Xpert Xpress SARS-CoV-2/FLU/RSV plus assay is intended as an aid in the diagnosis of influenza from Nasopharyngeal swab specimens and should not be used as a sole basis for treatment. Nasal washings and aspirates are unacceptable for Xpert Xpress SARS-CoV-2/FLU/RSV testing.  Fact Sheet for Patients: EntrepreneurPulse.com.au  Fact Sheet for Healthcare Providers: IncredibleEmployment.be  This test is not yet approved or cleared by the [place] FDA and has been authorized for detection and/or diagnosis of SARS-CoV-2 by FDA under an Emergency Use Authorization (EUA). This EUA will remain in effect (meaning this test can be used) for the duration of the COVID-19 declaration under Section 564(b)(1) of the Act, 21 U.S.C. section 360bbb-3(b)(1), unless the authorization is terminated or revoked.  Performed at [facility], [place] [address] [place], [place] [address]   Urine Culture     Status: None   Collection Time: 02/04/2021  7:46 PM   Specimen: Urine, Catheterized  Result Value Ref Range Status   Specimen Description URINE, CATHETERIZED  Final   Special Requests NONE  Final   Culture   Final    NO GROWTH Performed at [facility], [address], [place], [place] [address]    Report Status 02/06/2021 FINAL  Final  MRSA Next Gen by PCR, Nasal     Status: None   Collection Time: 02/02/2021  9:00 PM   Specimen: Nasal Mucosa; Nasal Swab  Result Value Ref  Range Status   MRSA by PCR Next Gen NOT DETECTED NOT DETECTED Final    Comment: (NOTE) The GeneXpert MRSA Assay (FDA approved for NASAL specimens only), is one component of a comprehensive MRSA colonization surveillance program. It is not intended to diagnose MRSA infection nor to guide or monitor treatment for MRSA infections. Test performance is not FDA approved in patients less than 3 years old. Performed at [facility], [place] [address], [place], [place] [address]   Culture, blood (routine x 2)     Status: None   Collection Time: 02/07/2021  9:28 PM   Specimen: BLOOD LEFT HAND  Result Value Ref Range Status   Specimen Description BLOOD LEFT HAND  Final   Special Requests AEROBIC BOTTLE ONLY Blood Culture adequate volume  Final   Culture   Final    NO GROWTH 5 DAYS Performed at [facility], [place] [address] [place], [place] [address]    Report Status 02/10/2021 FINAL  Final  Culture, blood (routine x 2)     Status: None   Collection Time: 02/02/2021  9:31 PM   Specimen: BLOOD RIGHT HAND  Result Value Ref Range Status   Specimen Description BLOOD RIGHT HAND  Final   Special Requests   Final    BOTTLES DRAWN AEROBIC AND ANAEROBIC Blood Culture adequate volume   Culture   Final    NO GROWTH 5 DAYS Performed at [facility], [place] [address], [place], [place] [address]    Report Status 02/10/2021 FINAL  Final    Lab Basic Metabolic Panel: Recent Labs  Lab 02/08/21 1636 02/09/21 0502 02/09/21 1701 02/10/21 0515  NA  --  142  --  147*  K  --  4.1  --  4.4  CL  --  110  --  111  CO2  --  24  --  26  GLUCOSE  --  292*  --  278*  BUN  --  38*  --  43*  CREATININE  --  0.92  --  0.91  CALCIUM  --  8.8*  --  9.1  MG 2.8* 2.7* 2.8* 2.7*  PHOS 2.8 2.1* 3.2  --    Liver Function Tests: No results for input(s): AST, ALT, ALKPHOS, BILITOT, PROT, ALBUMIN in the last 168 hours. No results for input(s): LIPASE, AMYLASE in the last 168 hours. No results for  input(s): AMMONIA in the last 168 hours. CBC: Recent Labs  Lab 02/09/21 0502  WBC 5.3  HGB 14.9  HCT 45.9  MCV 89.0  PLT 176   Cardiac Enzymes: No results for input(s): CKTOTAL, CKMB, CKMBINDEX, TROPONINI in the last 168 hours. Sepsis Labs: Recent Labs  Lab 02/09/21 0502  WBC 5.3    Procedures/Operations  Mechanical ventilation.   [name] Frank 02/15/2021, 9:06 AM

## 2021-03-12 DEATH — deceased

## 2021-03-27 ENCOUNTER — Ambulatory Visit: Payer: Medicare HMO | Admitting: Neurology
